# Patient Record
Sex: Female | Born: 1950
Health system: Southern US, Community
[De-identification: ages and names within clinical notes are randomized; demographics above are authoritative.]

## PROBLEM LIST (undated history)

## (undated) DIAGNOSIS — R9439 Abnormal result of other cardiovascular function study: Secondary | ICD-10-CM

## (undated) DIAGNOSIS — E785 Hyperlipidemia, unspecified: Secondary | ICD-10-CM

## (undated) DIAGNOSIS — J349 Unspecified disorder of nose and nasal sinuses: Secondary | ICD-10-CM

## (undated) DIAGNOSIS — G4733 Obstructive sleep apnea (adult) (pediatric): Secondary | ICD-10-CM

## (undated) DIAGNOSIS — G4730 Sleep apnea, unspecified: Secondary | ICD-10-CM

## (undated) DIAGNOSIS — I82409 Acute embolism and thrombosis of unspecified deep veins of unspecified lower extremity: Secondary | ICD-10-CM

## (undated) DIAGNOSIS — H269 Unspecified cataract: Secondary | ICD-10-CM

## (undated) DIAGNOSIS — D45 Polycythemia vera: Secondary | ICD-10-CM

## (undated) DIAGNOSIS — F32A Depression, unspecified: Secondary | ICD-10-CM

## (undated) DIAGNOSIS — E859 Amyloidosis, unspecified: Secondary | ICD-10-CM

## (undated) DIAGNOSIS — D510 Vitamin B12 deficiency anemia due to intrinsic factor deficiency: Secondary | ICD-10-CM

## (undated) DIAGNOSIS — I700 Atherosclerosis of aorta: Secondary | ICD-10-CM

## (undated) DIAGNOSIS — I5189 Other ill-defined heart diseases: Secondary | ICD-10-CM

## (undated) DIAGNOSIS — I5032 Chronic diastolic (congestive) heart failure: Secondary | ICD-10-CM

## (undated) DIAGNOSIS — D649 Anemia, unspecified: Secondary | ICD-10-CM

## (undated) DIAGNOSIS — I2510 Atherosclerotic heart disease of native coronary artery without angina pectoris: Secondary | ICD-10-CM

## (undated) DIAGNOSIS — I2699 Other pulmonary embolism without acute cor pulmonale: Secondary | ICD-10-CM

## (undated) DIAGNOSIS — M1990 Unspecified osteoarthritis, unspecified site: Secondary | ICD-10-CM

## (undated) DIAGNOSIS — Z5189 Encounter for other specified aftercare: Secondary | ICD-10-CM

## (undated) DIAGNOSIS — T7840XA Allergy, unspecified, initial encounter: Secondary | ICD-10-CM

## (undated) DIAGNOSIS — Z9989 Dependence on other enabling machines and devices: Secondary | ICD-10-CM

## (undated) DIAGNOSIS — K5090 Crohn's disease, unspecified, without complications: Secondary | ICD-10-CM

## (undated) DIAGNOSIS — K5010 Crohn's disease of large intestine without complications: Secondary | ICD-10-CM

## (undated) DIAGNOSIS — J849 Interstitial pulmonary disease, unspecified: Secondary | ICD-10-CM

## (undated) DIAGNOSIS — D751 Secondary polycythemia: Secondary | ICD-10-CM

## (undated) DIAGNOSIS — I519 Heart disease, unspecified: Secondary | ICD-10-CM

## (undated) DIAGNOSIS — I272 Pulmonary hypertension, unspecified: Secondary | ICD-10-CM

## (undated) DIAGNOSIS — E119 Type 2 diabetes mellitus without complications: Secondary | ICD-10-CM

## (undated) DIAGNOSIS — R0902 Hypoxemia: Secondary | ICD-10-CM

## (undated) DIAGNOSIS — T148XXA Other injury of unspecified body region, initial encounter: Secondary | ICD-10-CM

## (undated) DIAGNOSIS — R0600 Dyspnea, unspecified: Secondary | ICD-10-CM

## (undated) DIAGNOSIS — F419 Anxiety disorder, unspecified: Secondary | ICD-10-CM

## (undated) HISTORY — DX: Vitamin B12 deficiency anemia due to intrinsic factor deficiency: D51.0

## (undated) HISTORY — DX: Unspecified cataract: H26.9

## (undated) HISTORY — PX: JOINT REPLACEMENT: SHX530

## (undated) HISTORY — DX: Atherosclerosis of aorta: I70.0

## (undated) HISTORY — DX: Atherosclerotic heart disease of native coronary artery without angina pectoris: I25.10

## (undated) HISTORY — PX: SMALL INTESTINE SURGERY: SHX150

## (undated) HISTORY — DX: Depression, unspecified: F32.A

## (undated) HISTORY — PX: COLON SURGERY: SHX602

## (undated) HISTORY — DX: Unspecified disorder of nose and nasal sinuses: J34.9

## (undated) HISTORY — PX: EYE SURGERY: SHX253

## (undated) HISTORY — PX: HIP ARTHROPLASTY: SHX981

## (undated) HISTORY — DX: Interstitial pulmonary disease, unspecified: J84.9

## (undated) HISTORY — DX: Hypoxemia: R09.02

## (undated) HISTORY — DX: Chronic diastolic (congestive) heart failure: I50.32

## (undated) HISTORY — DX: Pulmonary hypertension, unspecified: I27.20

## (undated) HISTORY — DX: Polycythemia vera: D45

## (undated) HISTORY — DX: Allergy, unspecified, initial encounter: T78.40XA

## (undated) HISTORY — DX: Crohn's disease of large intestine without complications: K50.10

## (undated) HISTORY — PX: CARDIAC CATHETERIZATION: SHX172

## (undated) HISTORY — DX: Encounter for other specified aftercare: Z51.89

## (undated) HISTORY — DX: Hyperlipidemia, unspecified: E78.5

## (undated) HISTORY — DX: Amyloidosis, unspecified: E85.9

## (undated) HISTORY — PX: COLON RESECTION: SHX5231

## (undated) HISTORY — DX: Sleep apnea, unspecified: G47.30

---
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
## 1991-12-27 HISTORY — PX: COLON RESECTION: SHX5231

## 2008-12-26 DIAGNOSIS — I2699 Other pulmonary embolism without acute cor pulmonale: Secondary | ICD-10-CM

## 2008-12-26 HISTORY — DX: Other pulmonary embolism without acute cor pulmonale: I26.99

## 2010-01-06 ENCOUNTER — Encounter: Admission: RE | Admit: 2010-01-06 | Discharge: 2010-04-06 | Payer: Self-pay | Admitting: Unknown Physician Specialty

## 2010-05-11 ENCOUNTER — Encounter: Admission: RE | Admit: 2010-05-11 | Discharge: 2010-08-09 | Payer: Self-pay | Admitting: Unknown Physician Specialty

## 2010-08-19 ENCOUNTER — Encounter: Admission: RE | Admit: 2010-08-19 | Discharge: 2010-08-20 | Payer: Self-pay | Admitting: Unknown Physician Specialty

## 2012-02-22 ENCOUNTER — Emergency Department (HOSPITAL_BASED_OUTPATIENT_CLINIC_OR_DEPARTMENT_OTHER)
Admission: EM | Admit: 2012-02-22 | Discharge: 2012-02-23 | Disposition: A | Discharge status: Home / Self Care | Payer: 59 | Attending: Emergency Medicine | Admitting: Emergency Medicine

## 2012-02-22 ENCOUNTER — Encounter (HOSPITAL_BASED_OUTPATIENT_CLINIC_OR_DEPARTMENT_OTHER): Payer: Self-pay | Admitting: Emergency Medicine

## 2012-02-22 ENCOUNTER — Emergency Department (INDEPENDENT_AMBULATORY_CARE_PROVIDER_SITE_OTHER): Payer: 59

## 2012-02-22 ENCOUNTER — Emergency Department (HOSPITAL_BASED_OUTPATIENT_CLINIC_OR_DEPARTMENT_OTHER): Payer: 59

## 2012-02-22 DIAGNOSIS — M25529 Pain in unspecified elbow: Secondary | ICD-10-CM | POA: Insufficient documentation

## 2012-02-22 DIAGNOSIS — M79609 Pain in unspecified limb: Secondary | ICD-10-CM

## 2012-02-22 DIAGNOSIS — W010XXA Fall on same level from slipping, tripping and stumbling without subsequent striking against object, initial encounter: Secondary | ICD-10-CM

## 2012-02-22 DIAGNOSIS — M549 Dorsalgia, unspecified: Secondary | ICD-10-CM | POA: Insufficient documentation

## 2012-02-22 DIAGNOSIS — M25539 Pain in unspecified wrist: Secondary | ICD-10-CM | POA: Insufficient documentation

## 2012-02-22 DIAGNOSIS — K5090 Crohn's disease, unspecified, without complications: Secondary | ICD-10-CM | POA: Insufficient documentation

## 2012-02-22 DIAGNOSIS — M25429 Effusion, unspecified elbow: Secondary | ICD-10-CM

## 2012-02-22 DIAGNOSIS — S52123A Displaced fracture of head of unspecified radius, initial encounter for closed fracture: Secondary | ICD-10-CM

## 2012-02-22 DIAGNOSIS — W19XXXA Unspecified fall, initial encounter: Secondary | ICD-10-CM

## 2012-02-22 HISTORY — DX: Anemia, unspecified: D64.9

## 2012-02-22 HISTORY — DX: Crohn's disease, unspecified, without complications: K50.90

## 2012-02-22 MED ORDER — OXYCODONE-ACETAMINOPHEN 5-325 MG PO TABS: 1.0000 | ORAL_TABLET | ORAL | Status: AC | PRN

## 2012-02-22 NOTE — ED Provider Notes (Signed)
History     CSN: [id number]  Arrival date & time 02/22/12  2207   First MD Initiated Contact with Patient 02/22/12 2226      Chief Complaint  Patient presents with  . Fall  . Arm Injury  . Back Pain     Patient is a 61 y.o. female presenting with fall. The history is provided by the patient.  Fall The accident occurred less than 1 hour ago. The fall occurred while walking. She landed on concrete. The point of impact was the right elbow and right wrist. The pain is present in the right elbow and right wrist. The pain is moderate. She was ambulatory at the scene. Pertinent negatives include no headaches and no loss of consciousness.  Patient presents s/p fall just prior to arrival.  She was bringing her mother to the hospital, and in the process she tripped and fell on her right wrist/forearm.  She reports she tripped on the curb.   No head injury.  No LOC.  No headache.  No neck pain.  No back pain No cp/sob No abd pain She is ambulatory No weakness in her extremities No visual changes reported  Past Medical History  Diagnosis Date  . Crohn disease   . Anemia     Past Surgical History  Procedure Date  . Hip arthroplasty   . Colon resection     No family history on file.  History  Substance Use Topics  . Smoking status: Never Smoker   . Smokeless tobacco: Not on file  . Alcohol Use: Yes    OB History    Grav Para Term Preterm Abortions TAB SAB Ect Mult Living                  Review of Systems  Neurological: Negative for loss of consciousness and headaches.  All other systems reviewed and are negative.    Allergies  Prednisone and Sulfa drugs cross reactors  Home Medications   Current Outpatient Rx  Name Route Sig Dispense Refill  . BUPROPION HCL ER (SR) 100 MG PO TB12 Oral Take 100 mg by mouth daily.    [name] CALCIUM CARBONATE 1250 MG PO CHEW Oral Chew 1 tablet by mouth daily.    [name] CLONAZEPAM 0.5 MG PO TABS Oral Take 0.5 mg by mouth at bedtime as  needed. For sleep    . VITAMIN B-12 IJ Injection Inject as directed.    [name] FLUOXETINE HCL 20 MG PO CAPS Oral Take 20 mg by mouth daily.    [name] HYDROCODONE-ACETAMINOPHEN 5-325 MG PO TABS Oral Take 1 tablet by mouth every 6 (six) hours as needed. For pain    . MESALAMINE ER 500 MG PO CPCR Oral Take 250 mg by mouth. Up to 8 times a day    . VITAMIN D (CHOLECALCIFEROL) PO Oral Take 1 capsule by mouth daily.      BP 135/87  Pulse 118  Temp(Src) 98 F (36.7 C) (Oral)  Resp 18  Ht _0  (1.651 m)  Wt 225 lb (102.059 kg)  BMI 37.44 kg/m2  SpO2 95%  Physical Exam CONSTITUTIONAL: Well developed/well nourished HEAD AND FACE: Normocephalic/atraumatic EYES: EOMI/PERRL ENMT: Mucous membranes moist, No evidence of facial/nasal trauma NECK: supple no meningeal signs SPINE:entire spine nontender, NEXUS criteria met, No bruising/crepitance/stepoffs noted to spine CV: S1/S2 noted, no murmurs/rubs/gallops noted LUNGS: Lungs are clear to auscultation bilaterally, no apparent distress ABDOMEN: soft, nontender, no rebound or guarding GU:no cva tenderness NEURO: Pt is  awake/alert, moves all extremitiesx4. GCS 15.  Pt is ambulatory EXTREMITIES: pulses normal, full ROM.  Tenderness to palpation of right anterior shoulder but no deformity and she is able to range the shoulder but is limited in abduction above 90 degrees. She has tenderness with ROM of right elbow (no deformity) and mild tenderness to right wrist.  Negative snuffbox on right wrist.  Distal neurovascular intact on right Ue - full ROM of right wrist, equal hand grip. All other extremities/joints palpated/ranged and nontender No clavicle tenderness SKIN: warm, color normal PSYCH: no abnormalities of mood noted  ED Course  Procedures   Pt deferred pain meds initially  11:58 PM Pt with likely nondisplaced radial head fracture No other injury noted She is well appearing, ambulatory Will place in posterior splint/sling and close ortho/sports  med followup for re-evaluation within 5 days Pt agreeable with plan   The patient appears reasonably screened and/or stabilized for discharge and I doubt any other medical condition or other [facility] requiring further screening, evaluation, or treatment in the ED at this time prior to discharge.     MDM  Nursing notes reviewed and considered in documentation xrays reviewed and considered Discussed with radiologist        Sharyon Cable, MD 02/23/12 [phone]

## 2012-02-22 NOTE — ED Notes (Signed)
Pt fell in parking lot over curb. Pt c/o right arm pain from shoulder to wrist and pain in back between shoulder blades.

## 2012-03-09 ENCOUNTER — Ambulatory Visit
Admission: RE | Admit: 2012-03-09 | Discharge: 2012-03-09 | Disposition: A | Discharge status: Home / Self Care | Payer: No Typology Code available for payment source | Source: Ambulatory Visit | Attending: Orthopedic Surgery | Admitting: Orthopedic Surgery

## 2012-03-09 ENCOUNTER — Other Ambulatory Visit: Payer: Self-pay | Admitting: Orthopedic Surgery

## 2012-03-09 DIAGNOSIS — S52123A Displaced fracture of head of unspecified radius, initial encounter for closed fracture: Secondary | ICD-10-CM

## 2012-06-01 ENCOUNTER — Other Ambulatory Visit: Payer: Self-pay | Admitting: Orthopedic Surgery

## 2012-06-01 ENCOUNTER — Ambulatory Visit
Admission: RE | Admit: 2012-06-01 | Discharge: 2012-06-01 | Disposition: A | Discharge status: Home / Self Care | Payer: 59 | Source: Ambulatory Visit | Attending: Orthopedic Surgery | Admitting: Orthopedic Surgery

## 2012-06-01 DIAGNOSIS — T148XXA Other injury of unspecified body region, initial encounter: Secondary | ICD-10-CM

## 2012-12-14 ENCOUNTER — Other Ambulatory Visit: Payer: Self-pay | Admitting: Orthopedic Surgery

## 2012-12-14 ENCOUNTER — Ambulatory Visit (INDEPENDENT_AMBULATORY_CARE_PROVIDER_SITE_OTHER): Payer: 59

## 2012-12-14 DIAGNOSIS — W19XXXA Unspecified fall, initial encounter: Secondary | ICD-10-CM

## 2012-12-14 DIAGNOSIS — M25549 Pain in joints of unspecified hand: Secondary | ICD-10-CM

## 2012-12-14 DIAGNOSIS — R52 Pain, unspecified: Secondary | ICD-10-CM

## 2014-11-12 DIAGNOSIS — I82409 Acute embolism and thrombosis of unspecified deep veins of unspecified lower extremity: Secondary | ICD-10-CM | POA: Insufficient documentation

## 2014-11-12 DIAGNOSIS — T849XXA Unspecified complication of internal orthopedic prosthetic device, implant and graft, initial encounter: Secondary | ICD-10-CM | POA: Insufficient documentation

## 2014-11-12 DIAGNOSIS — Z471 Aftercare following joint replacement surgery: Secondary | ICD-10-CM | POA: Insufficient documentation

## 2014-11-12 DIAGNOSIS — I2699 Other pulmonary embolism without acute cor pulmonale: Secondary | ICD-10-CM | POA: Diagnosis present

## 2014-11-12 DIAGNOSIS — D45 Polycythemia vera: Secondary | ICD-10-CM | POA: Insufficient documentation

## 2014-11-12 DIAGNOSIS — Z862 Personal history of diseases of the blood and blood-forming organs and certain disorders involving the immune mechanism: Secondary | ICD-10-CM | POA: Insufficient documentation

## 2015-11-11 DIAGNOSIS — J069 Acute upper respiratory infection, unspecified: Secondary | ICD-10-CM | POA: Diagnosis not present

## 2015-12-25 DIAGNOSIS — D649 Anemia, unspecified: Secondary | ICD-10-CM | POA: Insufficient documentation

## 2015-12-30 DIAGNOSIS — D649 Anemia, unspecified: Secondary | ICD-10-CM | POA: Diagnosis not present

## 2015-12-30 DIAGNOSIS — Z79899 Other long term (current) drug therapy: Secondary | ICD-10-CM | POA: Diagnosis not present

## 2016-09-02 DIAGNOSIS — H52223 Regular astigmatism, bilateral: Secondary | ICD-10-CM | POA: Diagnosis not present

## 2016-09-02 DIAGNOSIS — H353131 Nonexudative age-related macular degeneration, bilateral, early dry stage: Secondary | ICD-10-CM | POA: Diagnosis not present

## 2016-09-02 DIAGNOSIS — H524 Presbyopia: Secondary | ICD-10-CM | POA: Diagnosis not present

## 2016-09-02 DIAGNOSIS — H5213 Myopia, bilateral: Secondary | ICD-10-CM | POA: Diagnosis not present

## 2016-09-02 DIAGNOSIS — H2011 Chronic iridocyclitis, right eye: Secondary | ICD-10-CM | POA: Diagnosis not present

## 2016-09-12 DIAGNOSIS — H2011 Chronic iridocyclitis, right eye: Secondary | ICD-10-CM | POA: Diagnosis not present

## 2016-11-08 DIAGNOSIS — H2011 Chronic iridocyclitis, right eye: Secondary | ICD-10-CM | POA: Diagnosis not present

## 2016-12-07 DIAGNOSIS — D508 Other iron deficiency anemias: Secondary | ICD-10-CM | POA: Diagnosis not present

## 2016-12-07 DIAGNOSIS — D649 Anemia, unspecified: Secondary | ICD-10-CM | POA: Diagnosis not present

## 2017-01-24 DIAGNOSIS — S29012A Strain of muscle and tendon of back wall of thorax, initial encounter: Secondary | ICD-10-CM | POA: Diagnosis not present

## 2017-01-24 DIAGNOSIS — X500XXA Overexertion from strenuous movement or load, initial encounter: Secondary | ICD-10-CM | POA: Diagnosis not present

## 2017-04-03 ENCOUNTER — Other Ambulatory Visit: Payer: Self-pay | Admitting: Allergy

## 2017-04-03 ENCOUNTER — Ambulatory Visit
Admission: RE | Admit: 2017-04-03 | Discharge: 2017-04-03 | Disposition: A | Discharge status: Home / Self Care | Payer: Medicare Other | Source: Ambulatory Visit | Attending: Allergy | Admitting: Allergy

## 2017-04-03 DIAGNOSIS — R059 Cough, unspecified: Secondary | ICD-10-CM

## 2017-04-03 DIAGNOSIS — R05 Cough: Secondary | ICD-10-CM

## 2017-04-03 DIAGNOSIS — J300 Vasomotor rhinitis: Secondary | ICD-10-CM | POA: Diagnosis not present

## 2017-04-03 DIAGNOSIS — J309 Allergic rhinitis, unspecified: Secondary | ICD-10-CM | POA: Diagnosis not present

## 2017-05-09 ENCOUNTER — Telehealth: Payer: Self-pay | Admitting: Internal Medicine

## 2017-05-09 ENCOUNTER — Encounter: Payer: Self-pay | Admitting: Internal Medicine

## 2017-05-09 ENCOUNTER — Ambulatory Visit (INDEPENDENT_AMBULATORY_CARE_PROVIDER_SITE_OTHER): Payer: Medicare Other | Admitting: Internal Medicine

## 2017-05-09 VITALS — BP 144/88 | HR 87 | Ht 64.0 in | Wt 270.6 lb

## 2017-05-09 DIAGNOSIS — R0989 Other specified symptoms and signs involving the circulatory and respiratory systems: Secondary | ICD-10-CM

## 2017-05-09 DIAGNOSIS — R683 Clubbing of fingers: Secondary | ICD-10-CM

## 2017-05-09 DIAGNOSIS — R0602 Shortness of breath: Secondary | ICD-10-CM | POA: Diagnosis not present

## 2017-05-09 NOTE — Telephone Encounter (Signed)
lmtcb for pt.   Need to see if pt has the wrong AVS, if so, what the 4 numbers written on top right hand of AVS.

## 2017-05-09 NOTE — Progress Notes (Signed)
Subjective:    Patient ID: Patty [name], female    DOB: 02/05/1951, 66 y.o.   MRN: [id number]  PCP Patient, No Pcp Per  HPI    IOV  05/09/2017  Chief Complaint  Patient presents with  . Pulmonary Consult    Pt referred for SOB with activity x years. Pt states over the last year the DOE has worsened. Pt denies cough and CP/tightness and f/c/s.     66 year old obese female. In 2004 Started noticing insidious onset of shortness of breath. In 2005 more [place] from [place] and probably gain over 60 pounds of weight. She has extensive workup at that time according to her history. She was then diagnosed with polycythemia by Dr. [name] at [facility]. She does not recollect any phlebotomies for this. However in the last 4-5 years she's had worsening shortness of breath. Noticeable with exertion particularly in the gym and doing standing exercises but not so much with sitting exercises. Relieved by rest. When she does some yard work she notices some associated wheezing as well.  Walking desaturation test in the office she did desaturate to 88% and did get tachycardic. She says that a chest x-ray done by primary care physicians interstitial pulmonary fibrosis. I personally visualized the chest x-ray done 04/03/2017: There is some interstitial markings but it is obscured by her obesity. I'm not so certain that is definite ILD. There is no lab work in the record. There is no PFT a CT scan available for visualization      has a past medical history of Anemia; Crohn disease ([place]); and Polycythemia vera ([place]).   reports that she quit smoking about 8 years ago. Her smoking use included Cigarettes. She has a 45.00 pack-year smoking history. She has never used smokeless tobacco.  Past Surgical History:  Procedure Laterality Date  . COLON RESECTION    . HIP ARTHROPLASTY      Allergies  Allergen Reactions  . Prednisone Other (See Comments)    Causes mental stress  . Sulfa Drugs Cross  Reactors Other (See Comments)    "feels like bugs crawling on me"     There is no immunization history on file for this patient.  Family History  Problem Relation Age of Onset  . Glaucoma Mother   . Diabetes Mellitus II Father   . COPD Brother   . Heart disease Brother   . Non-Hodgkin's lymphoma Brother   . Diabetes Mellitus II Brother   . Glaucoma Brother      Current Outpatient Prescriptions:  .  B Complex-C (B-COMPLEX WITH VITAMIN C) tablet, Take 1 tablet by mouth daily., Disp: , Rfl:  .  calcium carbonate (OS-CAL) 1250 MG chewable tablet, Chew 1 tablet by mouth daily., Disp: , Rfl:  .  Cyanocobalamin (VITAMIN B-12 IJ), Inject as directed., Disp: , Rfl:  .  traMADol (ULTRAM) 50 MG tablet, Take 50 mg by mouth every 6 (six) hours as needed., Disp: , Rfl:  .  VITAMIN D, CHOLECALCIFEROL, PO, Take 1 capsule by mouth daily., Disp: , Rfl:   Review of Systems  Constitutional: Negative for fever and unexpected weight change.  HENT: Positive for congestion. Negative for dental problem, ear pain, nosebleeds, postnasal drip, rhinorrhea, sinus pressure, sneezing, sore throat and trouble swallowing.   Eyes: Negative for redness and itching.  Respiratory: Positive for shortness of breath. Negative for cough, chest tightness and wheezing.   Cardiovascular: Negative for palpitations and leg swelling.  Gastrointestinal: Negative for nausea and  vomiting.  Genitourinary: Negative for dysuria.  Musculoskeletal: Negative for joint swelling.  Skin: Negative for rash.  Neurological: Negative for headaches.  Hematological: Does not bruise/bleed easily.  Psychiatric/Behavioral: Negative for dysphoric mood. The patient is not nervous/anxious.        Objective:   Physical Exam  Constitutional: She is oriented to person, place, and time. She appears well-developed and well-nourished. No distress.  HENT:  Head: Normocephalic and atraumatic.  Right Ear: External ear normal.  Left Ear: External  ear normal.  Mouth/Throat: Oropharynx is clear and moist. No oropharyngeal exudate.  Eyes: Conjunctivae and EOM are normal. Pupils are equal, round, and reactive to light. Right eye exhibits no discharge. Left eye exhibits no discharge. No scleral icterus.  Neck: Normal range of motion. Neck supple. No JVD present. No tracheal deviation present. No thyromegaly present.  Cardiovascular: Normal rate, regular rhythm, normal heart sounds and intact distal pulses.  Exam reveals no gallop and no friction rub.   No murmur heard. Pulmonary/Chest: Effort normal. No respiratory distress. She has no wheezes. She has rales. She exhibits no tenderness.  Possible crackles in the lung base but not sure  Abdominal: Soft. Bowel sounds are normal. She exhibits no distension and no mass. There is no tenderness. There is no rebound and no guarding.  Musculoskeletal: Normal range of motion. She exhibits no edema or tenderness.  Lymphadenopathy:    She has no cervical adenopathy.  Neurological: She is alert and oriented to person, place, and time. She has normal reflexes. No cranial nerve deficit. She exhibits normal muscle tone. Coordination normal.  Skin: Skin is warm and dry. No rash noted. She is not diaphoretic. No erythema. No pallor.  Psychiatric: She has a normal mood and affect. Her behavior is normal. Judgment and thought content normal.  Vitals reviewed.   Vitals:   05/09/17 1134 05/09/17 1214  BP: (!) 144/88 128/72  Pulse: 87 74  SpO2: 97% 96%  Weight: 270 lb 9.6 oz (122.7 kg) 173 lb (78.5 kg)  Height: _0  (1.626 m) _1  (1.651 m)   Estimated body mass index is 28.79 kg/m as calculated from the following:   Height as of this encounter: _2  (1.651 m).   Weight as of this encounter: 173 lb (78.5 kg).        Assessment & Plan:     ICD-9-CM ICD-10-CM   1. Shortness of breath 786.05 R06.02 Pulmonary function test     CT Chest High Resolution     Pulse oximetry, overnight  2. Lung  crackles 786.7 R09.89 Pulmonary function test     CT Chest High Resolution  3. Clubbing of fingers 781.5 R68.3 Pulmonary function test     CT Chest High Resolution    Concern is for ILD  Do HRCT supine and prone Do ono room air Do PFT  Followup  next few weeks to see Dr. Chase Caller nurse practitioner Patricia Nettle but after completion of above tests - If tests are negative consider nitric oxide testing and if negative pulmonary stress testing - If interstitial lung disease present: [place] of chest physicians interstitial lung disease questionnaire and order autoimmune profile    Dr. Brand Males, M.D., [facility].C.P Pulmonary and Critical Care Medicine Staff Physician [place] [facility] Pager: [phone], If no answer or between  15:00h - 7:00h: call [phone]  05/09/2017 12:20 PM

## 2017-05-09 NOTE — Patient Instructions (Addendum)
ICD-9-CM ICD-10-CM   1. Shortness of breath 786.05 R06.02 Pulmonary function test     CT Chest High Resolution     Pulse oximetry, overnight  2. Lung crackles 786.7 R09.89 Pulmonary function test     CT Chest High Resolution  3. Clubbing of fingers 781.5 R68.3 Pulmonary function test     CT Chest High Resolution   Concern is for ILD  Do HRCT supine and prone Do ono room air Do PFT  Followup  next few weeks to see Dr. Chase Caller nurse practitioner Patricia Nettle but after completion of above tests - If tests are negative consider nitric oxide testing and if negative pulmonary stress testing - If interstitial lung disease present: [place] of chest physicians interstitial lung disease questionnaire and order autoimmune profile

## 2017-05-10 NOTE — Telephone Encounter (Signed)
LMTCB on both numbers listed

## 2017-05-11 NOTE — Telephone Encounter (Signed)
lmtcb for pt.

## 2017-05-11 NOTE — Telephone Encounter (Signed)
LMTCB for pt.  There were two sets of vitals entered in pt's chart, the first set is the correct set and the second set is incorrect. The pt's chart was corrected and a new AVS was printed and mailed to pt's home. Will forward to another TL to make aware of issue.

## 2017-05-11 NOTE — Telephone Encounter (Signed)
Patient returned call.  Verified with her again that she has correct AVS, had her read me MRN on the bottom, which is her MRN.  Numbers on the top right are [id number].  CB is [phone]

## 2017-05-12 ENCOUNTER — Ambulatory Visit (INDEPENDENT_AMBULATORY_CARE_PROVIDER_SITE_OTHER)
Admission: RE | Admit: 2017-05-12 | Discharge: 2017-05-12 | Disposition: A | Discharge status: Home / Self Care | Payer: Medicare Other | Source: Ambulatory Visit | Attending: Internal Medicine | Admitting: Internal Medicine

## 2017-05-12 DIAGNOSIS — R0602 Shortness of breath: Secondary | ICD-10-CM | POA: Diagnosis not present

## 2017-05-12 DIAGNOSIS — R683 Clubbing of fingers: Secondary | ICD-10-CM

## 2017-05-12 DIAGNOSIS — R0989 Other specified symptoms and signs involving the circulatory and respiratory systems: Secondary | ICD-10-CM

## 2017-05-15 NOTE — Telephone Encounter (Signed)
Noted. Will sign off.  

## 2017-05-15 NOTE — Telephone Encounter (Signed)
LMOM TCB x2 to make pt aware that [facility] mailed updated AVS  TL's Please see Daneil Dan previous message.

## 2017-05-16 ENCOUNTER — Telehealth: Payer: Self-pay | Admitting: Internal Medicine

## 2017-05-16 NOTE — Telephone Encounter (Signed)
I have called and lmom for the pt to make her aware that the new AVS has been mailed to her.

## 2017-05-17 ENCOUNTER — Ambulatory Visit (INDEPENDENT_AMBULATORY_CARE_PROVIDER_SITE_OTHER): Payer: Medicare Other | Admitting: Internal Medicine

## 2017-05-17 DIAGNOSIS — R0602 Shortness of breath: Secondary | ICD-10-CM

## 2017-05-17 DIAGNOSIS — R0989 Other specified symptoms and signs involving the circulatory and respiratory systems: Secondary | ICD-10-CM

## 2017-05-17 DIAGNOSIS — R683 Clubbing of fingers: Secondary | ICD-10-CM

## 2017-05-17 LAB — PULMONARY FUNCTION TEST
DL/VA % pred: 60 %
DL/VA: 2.89 ml/min/mmHg/L
DLCO cor % pred: 50 %
DLCO cor: 12.31 ml/min/mmHg
DLCO unc % pred: 52 %
DLCO unc: 12.64 ml/min/mmHg
FEF 25-75 Post: 1.2 L/sec
FEF 25-75 Pre: 1.21 L/sec
FEF2575-%Change-Post: -1 %
FEF2575-%Pred-Post: 58 %
FEF2575-%Pred-Pre: 59 %
FEV1-%Change-Post: 0 %
FEV1-%Pred-Post: 77 %
FEV1-%Pred-Pre: 77 %
FEV1-Post: 1.84 L
FEV1-Pre: 1.83 L
FEV1FVC-%Change-Post: 4 %
FEV1FVC-%Pred-Pre: 90 %
FEV6-%Change-Post: -2 %
FEV6-%Pred-Post: 85 %
FEV6-%Pred-Pre: 87 %
FEV6-Post: 2.54 L
FEV6-Pre: 2.61 L
FEV6FVC-%Pred-Post: 104 %
FEV6FVC-%Pred-Pre: 104 %
FVC-%Change-Post: -2 %
FVC-%Pred-Post: 81 %
FVC-%Pred-Pre: 84 %
FVC-Post: 2.54 L
FVC-Pre: 2.61 L
Post FEV1/FVC ratio: 73 %
Post FEV6/FVC ratio: 100 %
Pre FEV1/FVC ratio: 70 %
Pre FEV6/FVC Ratio: 100 %
RV % pred: 115 %
RV: 2.44 L
TLC % pred: 100 %
TLC: 5.09 L

## 2017-05-17 NOTE — Progress Notes (Signed)
PFT completed today. 05/17/17

## 2017-05-23 DIAGNOSIS — R0602 Shortness of breath: Secondary | ICD-10-CM | POA: Diagnosis not present

## 2017-05-24 DIAGNOSIS — R0602 Shortness of breath: Secondary | ICD-10-CM | POA: Diagnosis not present

## 2017-05-26 ENCOUNTER — Telehealth: Payer: Self-pay | Admitting: Internal Medicine

## 2017-05-26 NOTE — Telephone Encounter (Signed)
[name] you are seeing Patty [name] 05/29/17 as dyspnea with ? ILD presence workup  PFT - isolated low dlco HRCT - possilbe ILD + suspected pulm htn  rec   - please administer ACCP ILD questionnaire - google or elise can give you one and then document it. Many times I jus tell patients to do it in waiting room  - do Serum: ESR, ACE, ANA, DS-DNA, RF, anti-CCP, ssA, ssB, scl-70, ANCA screen, MPO, PR-3, Total CK,  RNP, Aldolase,  Hypersensitivity Pneumonitis Panel   - get echo for possilbe pulm htn  - if results are negative might need bx  Dr.  , M.D., F.C.C.P Pulmonary and Critical Care Medicine Staff Physician [facility] [place] [facility] Pager: [phone], If no answer or between  15:00h - 7:00h: call [phone]  05/26/2017 2:25 PM    

## 2017-05-29 ENCOUNTER — Telehealth: Payer: Self-pay | Admitting: Adult Health

## 2017-05-29 ENCOUNTER — Ambulatory Visit (INDEPENDENT_AMBULATORY_CARE_PROVIDER_SITE_OTHER): Payer: Medicare Other | Admitting: Adult Health

## 2017-05-29 ENCOUNTER — Other Ambulatory Visit (INDEPENDENT_AMBULATORY_CARE_PROVIDER_SITE_OTHER): Payer: Medicare Other

## 2017-05-29 ENCOUNTER — Encounter: Payer: Self-pay | Admitting: Adult Health

## 2017-05-29 VITALS — BP 128/86 | HR 79 | Ht 64.0 in | Wt 240.2 lb

## 2017-05-29 DIAGNOSIS — I7090 Unspecified atherosclerosis: Secondary | ICD-10-CM | POA: Diagnosis not present

## 2017-05-29 DIAGNOSIS — R938 Abnormal findings on diagnostic imaging of other specified body structures: Secondary | ICD-10-CM

## 2017-05-29 DIAGNOSIS — I272 Pulmonary hypertension, unspecified: Secondary | ICD-10-CM | POA: Diagnosis not present

## 2017-05-29 DIAGNOSIS — J849 Interstitial pulmonary disease, unspecified: Secondary | ICD-10-CM

## 2017-05-29 DIAGNOSIS — R9389 Abnormal findings on diagnostic imaging of other specified body structures: Secondary | ICD-10-CM

## 2017-05-29 DIAGNOSIS — J9611 Chronic respiratory failure with hypoxia: Secondary | ICD-10-CM | POA: Diagnosis not present

## 2017-05-29 LAB — SEDIMENTATION RATE: Sed Rate: 56 mm/hr — ABNORMAL HIGH (ref 0–30)

## 2017-05-29 NOTE — Telephone Encounter (Signed)
There is no info on a method tried, such as inhalers to treat resp diagnosis prior to O2. May call with questions.

## 2017-05-29 NOTE — Assessment & Plan Note (Signed)
Very Mild ILD changes on CT chest with isolated mod/severe diffucing capacity . No exertional desaturations. No significant restriction or obstruction on pulmonary function testing. Patient does have a history of Crohn's disease. Will need to continue with workup with autoimmune and connective tissue labs. We'll discuss her results on return. Consider repeat spirometry with DLCO in 6 months. Patient advised on exercise and activity as tolerated. Will discussed pulmonary rehabilitation on return CT chest did show incidental findings of enlarged pulmonary arteries will need to check 2-D echo for possible pulmonary hypertension. Overnight oximetry testing did show nocturnal desaturations. We'll begin on oxygen at 2 L. We'll also check for home sleep study is underlying sleep apnea may be present as well. CT did show aortic and coronary atherosclerosis. Patient will be referred to cardiology for risk stratification and need for any additional testing.

## 2017-05-29 NOTE — Assessment & Plan Note (Signed)
Overnight oximetry test did showed nocturnal desaturations. Patient had no ambulatory desaturations. Patient does have some risk factors for possible sleep apnea with daytime fatigue, trouble sleeping. And nocturnal hypoxia. Will check home sleep study. Patient is to begin on oxygen at 2 L. We'll repeat a ON O in 2 weeks after oxygen is started.

## 2017-05-29 NOTE — Patient Instructions (Addendum)
Labs today .  We will set up for a 2 D echo . Refer to Cardiologist  For abnormal CT chest that shows  coronary atherosclerosis. Begin Oxygen 2l/m At bedtime   Check ONO in 2 weeks after Oxygen is started.  Set up for home sleep study .  Follow up with Dr. Chase Caller in 6-8 weeks and As needed   Please contact office for sooner follow up if symptoms do not improve or worsen or seek emergency care

## 2017-05-29 NOTE — Progress Notes (Signed)
_0  ID: Patty [name], female    DOB: 1951/07/23, 66 y.o.   MRN: [id number]  Chief Complaint  Patient presents with  . Follow-up    dyspnea     Referring provider: No ref. provider found  HPI: 66 year old female former smoker seen for pulmonary consult 05/09/2017 for progressive dyspnea since 2004.[name] Patient has polycythemia vera followed by hematology and [place] She has Crohn's disease   05/29/2017 Follow up : Dyspnea  Patient returns for a two-week follow-up. Patient was seen for pulmonary consult 05/09/2017 for progressive dyspnea since 2004. Patient was set up for a high resolution CT chest that showed a very mild basilar predominant subpleural reticulation and bronchiectasis which could be due to nonspecific interstitial pneumonitis.. CT did have incidental findings of enlarged pulmonary arteries and aortic and coronary calcification. Patient had pulmonary function test on May 23 that showed an FEV1 at 77%, ratio 73, FVC 81, no significant bronchodilator response, total lung capacity 100%, DLCO 52%. Overnight oximetry test did show significant desaturations. We discussed beginning oxygen at bedtime.  Patient denies significant cough. Says that she get short of breath with walking. Has daytime fatigue and low energy..  Interstitial lung disease patient questionnaire was completed as follows Patient has no previous use of Macrodantin, amiodarone, methotrexate. She has been treated with prednisone for her Crohn's in the past. Patient has had extensive travel with brief visits to multiple state and country's.. [place]. Did live in a apartment that she did notice mold for 4 years. She does have Crohn's disease and was on Mesamaline for many years. Patient says she has had a humidifier and hot tub and previous house. She has no indoor birds. She does have a dog. Patient says she was told that she had a blood clot in the past but was never found.      Allergies   Allergen Reactions  . Prednisone Other (See Comments)    Causes mental stress  . Sulfa Drugs Cross Reactors Other (See Comments)    "feels like bugs crawling on me"     There is no immunization history on file for this patient.  Past Medical History:  Diagnosis Date  . Anemia   . Crohn disease ([place])   . Polycythemia vera ([place])     Tobacco History: History  Smoking Status  . Former Smoker  . Packs/day: 1.00  . Years: 45.00  . Types: Cigarettes  . Quit date: 12/26/2008  Smokeless Tobacco  . Never Used   Counseling given: Not Answered   Outpatient Encounter Prescriptions as of 05/29/2017  Medication Sig  . B Complex-C (B-COMPLEX WITH VITAMIN C) tablet Take 1 tablet by mouth daily.  . calcium carbonate (OS-CAL) 1250 MG chewable tablet Chew 1 tablet by mouth daily.  . Cyanocobalamin (VITAMIN B-12 IJ) Inject as directed.  . traMADol (ULTRAM) 50 MG tablet Take 50 mg by mouth every 6 (six) hours as needed.  [name] VITAMIN D, CHOLECALCIFEROL, PO Take 1 capsule by mouth daily.   No facility-administered encounter medications on file as of 05/29/2017.      Review of Systems  Constitutional:   No  weight loss, night sweats,  Fevers, chills,  +fatigue, or  lassitude.  HEENT:   No headaches,  Difficulty swallowing,  Tooth/dental problems, or  Sore throat,                No sneezing, itching, ear ache, nasal congestion, post nasal drip,   CV:  No  chest pain,  Orthopnea, PND, swelling in lower extremities, anasarca, dizziness, palpitations, syncope.   GI  No heartburn, indigestion, abdominal pain, nausea, vomiting, diarrhea, change in bowel habits, loss of appetite, bloody stools.   Resp:No excess mucus, no productive cough,  No non-productive cough,  No coughing up of blood.  No change in color of mucus.  No wheezing.  No chest wall deformity  Skin: no rash or lesions.  GU: no dysuria, change in color of urine, no urgency or frequency.  No flank pain, no hematuria   MS:  No joint  pain or swelling.  No decreased range of motion.  No back pain.    Physical Exam  BP 128/86 (BP Location: Left Arm, Cuff Size: Normal)   Pulse 79   Ht _0  (1.626 m)   Wt 240 lb 3.2 oz (109 kg)   SpO2 94%   BMI 41.23 kg/m   GEN: A/Ox3; pleasant , NAD, obese    HEENT:  [place]/AT,  EACs-clear, TMs-wnl, NOSE-clear, THROAT-clear, no lesions, no postnasal drip or exudate noted.Class 3 MP airway   NECK:  Supple w/ fair ROM; no JVD; normal carotid impulses w/o bruits; no thyromegaly or nodules palpated; no lymphadenopathy.    RESP  Clear  P & A; w/o, wheezes/ rales/ or rhonchi. no accessory muscle use, no dullness to percussion  CARD:  RRR, no m/r/g, no peripheral edema, pulses intact, no cyanosis or clubbing.  GI:   Soft & nt; nml bowel sounds; no organomegaly or masses detected.   Musco: Warm bil, no deformities or joint swelling noted.   Neuro: alert, no focal deficits noted.    Skin: Warm, no lesions or rashes    Lab Results:  CBC No results found for: WBC, RBC, HGB, HCT, PLT, MCV, MCH, MCHC, RDW, LYMPHSABS, MONOABS, EOSABS, BASOSABS  BMET No results found for: NA, K, CL, CO2, GLUCOSE, BUN, CREATININE, CALCIUM, GFRNONAA, GFRAA  BNP No results found for: BNP  ProBNP No results found for: PROBNP  Imaging: Ct Chest High Resolution  Result Date: 05/13/2017 CLINICAL DATA:  Increasing shortness of breath with exertion. Clubbing and crackles. EXAM: CT CHEST WITHOUT CONTRAST TECHNIQUE: Multidetector CT imaging of the chest was performed following the standard protocol without intravenous contrast. High resolution imaging of the lungs, as well as inspiratory and expiratory imaging, was performed. COMPARISON:  None. FINDINGS: Cardiovascular: Atherosclerotic calcification of the arterial vasculature, including coronary arteries. Heart size normal. No pericardial effusion. Pulmonary arteries are enlarged. Mediastinum/Nodes: No pathologically enlarged mediastinal or axillary lymph  nodes. Hilar regions are difficult to evaluate without IV contrast. Esophagus is grossly unremarkable. Lungs/Pleura: Very mild basilar predominant subpleural reticulation, which persists on prone imaging. Minimal bronchiolectasis. No architectural distortion or honeycombing. Mild scarring in the lingula and left lower lobe. No air trapping. No pleural fluid. Upper Abdomen: Visualized portions of the liver, gallbladder, adrenal glands, kidneys, spleen, pancreas and stomach are grossly unremarkable. No upper abdominal adenopathy. Musculoskeletal: Degenerative changes in the spine. No worrisome lytic or sclerotic lesions. IMPRESSION: 1. Suspect very mild basilar predominant subpleural reticulation and bronchiolectasis, which may be due to nonspecific interstitial pneumonitis. 2. Aortic atherosclerosis (ICD10-170.0). Coronary artery calcification. 3. Enlarged pulmonary arteries, indicative of pulmonary arterial hypertension. Electronically Signed   By: Lorin Picket M.D.   On: 05/13/2017 07:33     Assessment & Plan:   ILD (interstitial lung disease) ([place]) Very Mild ILD changes on CT chest with isolated mod/severe diffucing capacity . No exertional desaturations. No significant restriction or obstruction  on pulmonary function testing. Patient does have a history of Crohn's disease. Will need to continue with workup with autoimmune and connective tissue labs. We'll discuss her results on return. Consider repeat spirometry with DLCO in 6 months. Patient advised on exercise and activity as tolerated. Will discussed pulmonary rehabilitation on return CT chest did show incidental findings of enlarged pulmonary arteries will need to check 2-D echo for possible pulmonary hypertension. Overnight oximetry testing did show nocturnal desaturations. We'll begin on oxygen at 2 L. We'll also check for home sleep study is underlying sleep apnea may be present as well. CT did show aortic and coronary atherosclerosis.  Patient will be referred to cardiology for risk stratification and need for any additional testing.  Chronic respiratory failure with hypoxia (HCC) Overnight oximetry test did showed nocturnal desaturations. Patient had no ambulatory desaturations. Patient does have some risk factors for possible sleep apnea with daytime fatigue, trouble sleeping. And nocturnal hypoxia. Will check home sleep study. Patient is to begin on oxygen at 2 L. We'll repeat a ON O in 2 weeks after oxygen is started.     Rexene Edison, NP 05/29/2017

## 2017-05-30 ENCOUNTER — Encounter: Payer: Self-pay | Admitting: Cardiovascular Disease

## 2017-05-30 ENCOUNTER — Encounter: Payer: Self-pay | Admitting: Adult Health

## 2017-05-30 LAB — ANCA SCREEN W REFLEX TITER: ANCA Screen: NEGATIVE

## 2017-05-30 LAB — CYCLIC CITRUL PEPTIDE ANTIBODY, IGG: Cyclic Citrullin Peptide Ab: <16 Units

## 2017-05-30 LAB — ANTI-DNA ANTIBODY, DOUBLE-STRANDED: ds DNA Ab: 1 IU/mL

## 2017-05-30 LAB — CK TOTAL AND CKMB (NOT AT ARMC)
CK, MB: 1.3 ng/mL (ref 0.0–5.0)
Relative Index: 1.6 (ref 0.0–4.0)
Total CK: 81 U/L (ref 29–143)

## 2017-05-30 LAB — SJOGREN'S SYNDROME ANTIBODS(SSA + SSB)
SSA (Ro) (ENA) Antibody, IgG: <1
SSB (La) (ENA) Antibody, IgG: <1

## 2017-05-30 LAB — RHEUMATOID FACTOR: Rhuematoid fact SerPl-aCnc: <14 IU/mL (ref <14)

## 2017-05-30 LAB — MPO/PR-3 (ANCA) ANTIBODIES
Myeloperoxidase Abs: <1
Serine Protease 3: <1

## 2017-05-30 LAB — ANA: Anti Nuclear Antibody(ANA): NEGATIVE

## 2017-05-30 LAB — ANTI-SCLERODERMA ANTIBODY: Scleroderma (Scl-70) (ENA) Antibody, IgG: <1

## 2017-05-30 LAB — RNP ANTIBODY: Ribonucleic Protein(ENA) Antibody, IgG: <1

## 2017-05-30 NOTE — Telephone Encounter (Signed)
Spoke with Corene Cornea at [facility], states that since pt was newly diagnosed with ILD and there is no trial and failure on any inhalers to aid with hypoxia, Medicare will not cover supplemental 02.  Additionally, since sleep apnea is mentioned as a possibility, insurance will not accept an ONO for nocturnal O2- patient must have an in-lab sleep study.    TP please advise if ok to order a sleep study for pt.  Thanks .

## 2017-05-30 NOTE — Telephone Encounter (Signed)
PCC's can you please help with this?  We already placed the o2 order   Please see her email:  Hi [name] - I have not heard from [place] about having oxygen while I sleep. I called and after 25 minute hold they said they had no order for it from my Doctor. I also noticed that it is in my chart as  PULSE OXIMETRY, OVERNIGHTexpiring 6/4 - There was also the order from my 1st oximetry. Just wanted to let someone know.  I also wondered if there was another company that you prefer over [facility]?I chose them because they are close to my home.  Thank you, Ronnell [facility]

## 2017-05-30 NOTE — Telephone Encounter (Signed)
Left message for Corene Cornea to call back.

## 2017-05-30 NOTE — Telephone Encounter (Signed)
Patty [name], [facility], returning call, CB is [phone] ext [phone].

## 2017-05-30 NOTE — Telephone Encounter (Signed)
Ok , please switch DME companies  Also please refer back to ask Corene Cornea exactly what inhalers are approved for ILD , I am interested in that new research .  She does not have OSA nor has she been dx . She does have hypoxia that is documented there fore I have ordered oxygen appropriately .  Please change DME ASAP

## 2017-05-31 ENCOUNTER — Encounter: Payer: Self-pay | Admitting: Adult Health

## 2017-05-31 ENCOUNTER — Other Ambulatory Visit: Payer: Medicare Other

## 2017-05-31 ENCOUNTER — Telehealth: Payer: Self-pay | Admitting: Adult Health

## 2017-05-31 DIAGNOSIS — J849 Interstitial pulmonary disease, unspecified: Principal | ICD-10-CM

## 2017-05-31 DIAGNOSIS — J8409 Other alveolar and parieto-alveolar conditions: Secondary | ICD-10-CM

## 2017-05-31 LAB — ALDOLASE

## 2017-05-31 LAB — ANGIOTENSIN CONVERTING ENZYME: Angiotensin-Converting Enzyme: 30 U/L (ref 9–67)

## 2017-05-31 NOTE — Telephone Encounter (Signed)
Left a message for Patty [name] to call back for more details.

## 2017-05-31 NOTE — Telephone Encounter (Signed)
Per TP, please switch DME ASAP for new O2 start.  Order placed - see 05/31/17 note.  Nothing further needed.

## 2017-05-31 NOTE — Telephone Encounter (Signed)
Spoke with Patty [name] and notified of recs per TP  He is unsure of any covered inhalers for ILD, and states that "I am not a clinician, this went to clinical review" He hopes to have an answer to the question later today  Order sent to [facility] for new DME asap  Will forward to TP to let her know

## 2017-05-31 NOTE — Telephone Encounter (Signed)
Per TP, switch DME asap  See 05/31/17 telephone note.  Order placed for DME change for O2 start.   LM x 1 to make pt aware of this order being placed to change DME.   Will send back to TP to make aware this was taken care of.

## 2017-06-01 DIAGNOSIS — G4733 Obstructive sleep apnea (adult) (pediatric): Secondary | ICD-10-CM | POA: Diagnosis not present

## 2017-06-01 LAB — HYPERSENSITIVITY PNEUMONITIS
A. Pullulans Abs: NEGATIVE
A.Fumigatus #1 Abs: NEGATIVE
Micropolyspora faeni, IgG: NEGATIVE
Pigeon Serum Abs: NEGATIVE
Thermoact. Saccharii: NEGATIVE
Thermoactinomyces vulgaris, IgG: NEGATIVE

## 2017-06-01 NOTE — Telephone Encounter (Signed)
lmomtcb x 2 for the pt.  Wanted to let her know we have sent the order to a different DME company.

## 2017-06-01 NOTE — Telephone Encounter (Signed)
Patient returned call about this email.  The call got dropped before I could get any details or a CB number for her.

## 2017-06-01 NOTE — Telephone Encounter (Signed)
lmtcb x1 for pt. 

## 2017-06-02 ENCOUNTER — Telehealth: Payer: Self-pay | Admitting: Internal Medicine

## 2017-06-02 DIAGNOSIS — G4733 Obstructive sleep apnea (adult) (pediatric): Secondary | ICD-10-CM | POA: Diagnosis not present

## 2017-06-02 NOTE — Telephone Encounter (Signed)
Pt is aware of results per CY and understands she needs CPAP and must use this prior to any O2 being ordered. Insurance will not allow O2 since HST shows OSA. Pt agreed to get CPAP set up and order has been placed.

## 2017-06-06 ENCOUNTER — Other Ambulatory Visit: Payer: Self-pay | Admitting: *Deleted

## 2017-06-06 ENCOUNTER — Encounter: Payer: Self-pay | Admitting: Adult Health

## 2017-06-06 DIAGNOSIS — J849 Interstitial pulmonary disease, unspecified: Secondary | ICD-10-CM

## 2017-06-06 DIAGNOSIS — I272 Pulmonary hypertension, unspecified: Secondary | ICD-10-CM

## 2017-06-09 ENCOUNTER — Ambulatory Visit (HOSPITAL_COMMUNITY): Payer: Medicare Other | Attending: Internal Medicine

## 2017-06-09 ENCOUNTER — Other Ambulatory Visit: Payer: Self-pay

## 2017-06-09 DIAGNOSIS — I5030 Unspecified diastolic (congestive) heart failure: Secondary | ICD-10-CM | POA: Insufficient documentation

## 2017-06-09 DIAGNOSIS — I272 Pulmonary hypertension, unspecified: Secondary | ICD-10-CM | POA: Diagnosis not present

## 2017-06-12 ENCOUNTER — Encounter: Payer: Self-pay | Admitting: Adult Health

## 2017-06-12 DIAGNOSIS — G4733 Obstructive sleep apnea (adult) (pediatric): Secondary | ICD-10-CM

## 2017-06-12 NOTE — Telephone Encounter (Signed)
TP please advise on below email.  Thanks!   Hi [name] - I started the CPAP machine Friday night. I do notice a difference in feeling more rested however I have been experiencing aerophagia (Mostly bloating and diarrhea). This is starting to feel like Crohns activity. Will it aggravate my Crohns? Would an APAP machine or an autoset type device eliviate these symptoms? I think I also wake up early morning with a stuffed nose and nose spray has nor saline has not helped. Could this be due t still a lack of oxygen? I have 30 days to exchange the machine type which is why I am contacting you now.  Thank you!

## 2017-06-13 NOTE — Telephone Encounter (Signed)
Per TP: when did she start O2?  What pressure setting is she on?  Type of mask?  Her pressure may need to be adjusted.    CPAP was ordered on 6.8.18, with a pressure setting of auto 5-20.  Per TP: let's decrease her pressure to auto 5-15 and can set up for mask fitting.    E-mail sent to patient letting her know

## 2017-06-13 NOTE — Progress Notes (Signed)
Cardiology Office Note   Date:  06/15/2017   ID:  Patty [name], DOB 15-Jun-1951, MRN [id number]  PCP:  Patient, No Pcp Per  Cardiologist:   Jenkins Rouge, MD   No chief complaint on file.     History of Present Illness: Patty [name] is a 66 y.o. female who presents for evaluation of CAD.  Referred by Rexene Edison NP Reviewed her office note from 05/29/17.  Being seen for ILD and OSA  Has Crohns disease and being w/u for  Autoimmune lung disease On CPAP with nocturnal desats . CT scan chest 05/13/17 reviewed.   IMPRESSION: 1. Suspect very mild basilar predominant subpleural reticulation and bronchiolectasis, which may be due to nonspecific interstitial pneumonitis. 2. Aortic atherosclerosis (ICD10-170.0). Coronary artery calcification. 3. Enlarged pulmonary arteries, indicative of pulmonary arterial hypertension.  F/U echo reviewed from 06/09/17  No valve disease No pulmonary HTN  EF 60-65%   Labs reviewed and only abnormality noted was ESR 56  Minimal risk factors for CAD quit smoking 8 years ago  She is frustrated by not being able to lose weight. She ascribes it to Pvera, Pernicious anemia and OSA Thinks these things slowed her metabolism   Past Medical History:  Diagnosis Date  . Anemia   . Crohn disease ([place])   . Polycythemia vera ([place])     Past Surgical History:  Procedure Laterality Date  . COLON RESECTION    . HIP ARTHROPLASTY       Current Outpatient Prescriptions  Medication Sig Dispense Refill  . B Complex-C (B-COMPLEX WITH VITAMIN C) tablet Take 1 tablet by mouth daily.    . calcium carbonate (OS-CAL) 1250 MG chewable tablet Chew 1 tablet by mouth daily.    . Cetirizine-Pseudoephedrine (ZYRTEC-D PO) Take 1 tablet by mouth daily as needed.    . Cyanocobalamin (VITAMIN B-12 IJ) Inject as directed.    . Multiple Vitamins-Minerals (ICAPS AREDS 2 PO) Take 1 tablet by mouth as needed.    . traMADol (ULTRAM) 50 MG tablet Take 50 mg by mouth every 6  (six) hours as needed.    [name] VITAMIN D, CHOLECALCIFEROL, PO Take 1 capsule by mouth daily.     No current facility-administered medications for this visit.     Allergies:   Prednisone and Sulfa drugs cross reactors    Social History:  The patient  reports that she quit smoking about 8 years ago. Her smoking use included Cigarettes. She has a 45.00 pack-year smoking history. She has never used smokeless tobacco. She reports that she drinks alcohol. She reports that she does not use drugs.   Family History:  The patient's family history includes COPD in her brother; Diabetes Mellitus II in her brother and father; Glaucoma in her brother and mother; Heart disease in her brother; Non-Hodgkin's lymphoma in her brother.    ROS:  Please see the history of present illness.   Otherwise, review of systems are positive for none.   All other systems are reviewed and negative.    PHYSICAL EXAM: VS:  BP 138/84 (BP Location: Left Arm)   Pulse 88   Ht _0  (1.651 m)   Wt 108.8 kg (239 lb 12.8 oz)   BMI 39.90 kg/m  , BMI Body mass index is 39.9 kg/m. Affect appropriate Obese kyphotic female  HEENT: normal Neck supple with no adenopathy JVP normal no bruits no thyromegaly Lungs clear with no wheezing and good diaphragmatic motion Heart:  S1/S2 no murmur, no rub, gallop or  click PMI normal Abdomen: benighn, BS positve, no tenderness, no AAA no bruit.  No HSM or HJR Distal pulses intact with no bruits Plus one  Edema bilateral with varicosities  Neuro non-focal Skin warm and dry No muscular weakness    EKG:  NSR rate 88 LAD low voltage 06/15/2017    Recent Labs: No results found for requested labs within last 8760 hours.    Lipid Panel No results found for: CHOL, TRIG, HDL, CHOLHDL, VLDL, LDLCALC, LDLDIRECT    Wt Readings from Last 3 Encounters:  06/15/17 108.8 kg (239 lb 12.8 oz)  05/29/17 109 kg (240 lb 3.2 oz)  05/09/17 122.7 kg (270 lb 9.6 oz)      Other studies  Reviewed: Additional studies/ records that were reviewed today include: Notes [name] Parrett, labs and chest CT.    ASSESSMENT AND PLAN:  1.  CAD seen on CT with dyspnea f/u lexiscan myvovue 2. ILD exam not classic suspect she has combination of restrictive disease due to obesity And OSA  3. Elevated ESR  F/u primary to consider trial of steroids  4. OSA/CPAP tolerating CPAP last 4 days f/u pulmonary    Current medicines are reviewed at length with the patient today.  The patient does not have concerns regarding medicines.  The following changes have been made:  no change  Labs/ tests ordered today include: Lexiscan myovue   Orders Placed This Encounter  Procedures  . Myocardial Perfusion Imaging     Disposition:   FU with cardiology PRN      Signed, Jenkins Rouge, MD  06/15/2017 3:13 PM    [facility] [place], [place],   [address] Phone: [phone]; Fax: [phone]

## 2017-06-14 NOTE — Telephone Encounter (Signed)
TP  Please Advise- pt email  Patty [name]  to Melvenia Needles, NP       8:19 AM  Thank you! I have an AirFit N20 for Her. Nasal. I have been trying different things such as sitting up more and adjusting my mask

## 2017-06-14 NOTE — Telephone Encounter (Signed)
Spoke with TP:  Yes, a mask fitting session will likely help since she has a nasal mask, or we can add a chin strap.  Since the mask fitting session has already been ordered, let's continue with that.  Thanks.  E-mail sent to patient

## 2017-06-15 ENCOUNTER — Ambulatory Visit (INDEPENDENT_AMBULATORY_CARE_PROVIDER_SITE_OTHER): Payer: Medicare Other | Admitting: Cardiovascular Disease

## 2017-06-15 ENCOUNTER — Encounter: Payer: Self-pay | Admitting: Cardiovascular Disease

## 2017-06-15 VITALS — BP 138/84 | HR 88 | Ht 65.0 in | Wt 239.8 lb

## 2017-06-15 DIAGNOSIS — I2510 Atherosclerotic heart disease of native coronary artery without angina pectoris: Secondary | ICD-10-CM | POA: Diagnosis not present

## 2017-06-15 DIAGNOSIS — K5090 Crohn's disease, unspecified, without complications: Secondary | ICD-10-CM | POA: Insufficient documentation

## 2017-06-15 NOTE — Patient Instructions (Addendum)
Medication Instructions:  Your physician recommends that you continue on your current medications as directed. Please refer to the Current Medication list given to you today.  Labwork: NONE  Testing/Procedures: Your physician has requested that you have a lexiscan myoview. For further information please visit [email or web address]. Please follow instruction sheet, as given.  Follow-Up: Your physician wants you to follow-up as needed with Dr. Johnsie Cancel.    If you need a refill on your cardiac medications before your next appointment, please call your pharmacy.

## 2017-06-15 NOTE — Addendum Note (Signed)
Addended by: Aris Georgia, Claudell Wohler L on: 06/15/2017 04:35 PM   Modules accepted: Orders

## 2017-06-27 ENCOUNTER — Ambulatory Visit (HOSPITAL_BASED_OUTPATIENT_CLINIC_OR_DEPARTMENT_OTHER): Payer: Medicare Other | Attending: Adult Health | Admitting: Radiology

## 2017-06-27 DIAGNOSIS — G4733 Obstructive sleep apnea (adult) (pediatric): Secondary | ICD-10-CM

## 2017-07-04 ENCOUNTER — Encounter: Payer: Self-pay | Admitting: Adult Health

## 2017-07-08 ENCOUNTER — Encounter: Payer: Self-pay | Admitting: Internal Medicine

## 2017-07-10 ENCOUNTER — Ambulatory Visit (HOSPITAL_COMMUNITY): Payer: Medicare Other

## 2017-07-11 ENCOUNTER — Telehealth (HOSPITAL_COMMUNITY): Payer: Self-pay | Admitting: *Deleted

## 2017-07-11 ENCOUNTER — Ambulatory Visit (HOSPITAL_COMMUNITY): Payer: Medicare Other

## 2017-07-11 NOTE — Telephone Encounter (Signed)
Patient given detailed instructions per Myocardial Perfusion Study Information Sheet for the test on 07/13/17. Patient notified to arrive 15 minutes early and that it is imperative to arrive on time for appointment to keep from having the test rescheduled.  If you need to cancel or reschedule your appointment, please call the office within 24 hours of your appointment. . Patient verbalized understanding.Patty [name]

## 2017-07-13 ENCOUNTER — Ambulatory Visit (HOSPITAL_COMMUNITY): Payer: Medicare Other | Attending: Cardiology

## 2017-07-13 DIAGNOSIS — I2510 Atherosclerotic heart disease of native coronary artery without angina pectoris: Secondary | ICD-10-CM

## 2017-07-13 MED ORDER — REGADENOSON 0.4 MG/5ML IV SOLN
0.4000 mg | Freq: Once | INTRAVENOUS | Status: AC
  Administered 2017-07-13: 0.4 mg via INTRAVENOUS

## 2017-07-13 MED ORDER — TECHNETIUM TC 99M TETROFOSMIN IV KIT
32.8000 | PACK | Freq: Once | INTRAVENOUS | Status: AC | PRN
  Administered 2017-07-13: 32.8 via INTRAVENOUS
  Filled 2017-07-13: qty 33

## 2017-07-14 ENCOUNTER — Ambulatory Visit (HOSPITAL_COMMUNITY): Payer: Medicare Other | Attending: Cardiovascular Disease

## 2017-07-14 LAB — MYOCARDIAL PERFUSION IMAGING
LV dias vol: 75 mL (ref 46–106)
LV sys vol: 36 mL
Peak HR: 117 {beats}/min
RATE: 0.38
Rest HR: 100 {beats}/min
SDS: 5
SRS: 9
SSS: 12
TID: 1.02

## 2017-07-14 MED ORDER — TECHNETIUM TC 99M TETROFOSMIN IV KIT
30.3000 | PACK | Freq: Once | INTRAVENOUS | Status: AC | PRN
  Administered 2017-07-14: 30.3 via INTRAVENOUS
  Filled 2017-07-14: qty 31

## 2017-07-25 ENCOUNTER — Other Ambulatory Visit: Payer: Medicare Other

## 2017-07-25 ENCOUNTER — Encounter: Payer: Self-pay | Admitting: Cardiovascular Disease

## 2017-07-25 ENCOUNTER — Telehealth: Payer: Self-pay

## 2017-07-25 DIAGNOSIS — Z01812 Encounter for preprocedural laboratory examination: Secondary | ICD-10-CM | POA: Diagnosis not present

## 2017-07-25 DIAGNOSIS — D508 Other iron deficiency anemias: Secondary | ICD-10-CM | POA: Diagnosis not present

## 2017-07-25 DIAGNOSIS — R9439 Abnormal result of other cardiovascular function study: Secondary | ICD-10-CM

## 2017-07-25 DIAGNOSIS — R0602 Shortness of breath: Secondary | ICD-10-CM | POA: Diagnosis not present

## 2017-07-25 NOTE — Telephone Encounter (Signed)
-----  Message from Josue Hector, MD sent at 07/22/2017 12:04 PM EDT ----- myovue abnormal set up for cath next week not a good candidate for CT

## 2017-07-25 NOTE — Telephone Encounter (Signed)
Called patient back. Went over instructions for heart cath with patient. Patient will come in today for lab work. Patient is concerned about her past experiences with procedures and surgeries. Patient stated she has had blood clots (PE) before in the past, and wants to know if any one needs to consult her hematologist, Dr. Wendee Beavers before her procedure. Patient stated she does take ASA 81 mg daily. Will add to her medication list. Will forward to Dr. Johnsie Cancel.

## 2017-07-25 NOTE — Telephone Encounter (Signed)
Patient is scheduled for heart cath this Friday with Dr. Angelena Form. Waiting for patient to call back to give her instructions for heart cath.

## 2017-07-25 NOTE — Telephone Encounter (Signed)
Follow up     Patient returning call back to Dr. Johnsie Cancel nurse from today.

## 2017-07-26 ENCOUNTER — Encounter: Payer: Self-pay | Admitting: Cardiovascular Disease

## 2017-07-26 LAB — CBC WITH DIFFERENTIAL/PLATELET
Basophils Absolute: 0 10*3/uL (ref 0.0–0.2)
Basos: 0 %
EOS (ABSOLUTE): 0.2 10*3/uL (ref 0.0–0.4)
Eos: 2 %
Hematocrit: 44.1 % (ref 34.0–46.6)
Hemoglobin: 14.6 g/dL (ref 11.1–15.9)
Immature Grans (Abs): 0 10*3/uL (ref 0.0–0.1)
Immature Granulocytes: 0 %
Lymphocytes Absolute: 1.8 10*3/uL (ref 0.7–3.1)
Lymphs: 25 %
MCH: 30.4 pg (ref 26.6–33.0)
MCHC: 33.1 g/dL (ref 31.5–35.7)
MCV: 92 fL (ref 79–97)
Monocytes Absolute: 0.6 10*3/uL (ref 0.1–0.9)
Monocytes: 8 %
Neutrophils Absolute: 4.7 10*3/uL (ref 1.4–7.0)
Neutrophils: 65 %
Platelets: 342 10*3/uL (ref 150–379)
RBC: 4.81 x10E6/uL (ref 3.77–5.28)
RDW: 14.8 % (ref 12.3–15.4)
WBC: 7.4 10*3/uL (ref 3.4–10.8)

## 2017-07-26 LAB — BASIC METABOLIC PANEL
BUN/Creatinine Ratio: 18 (ref 12–28)
BUN: 14 mg/dL (ref 8–27)
CO2: 23 mmol/L (ref 20–29)
Calcium: 9.5 mg/dL (ref 8.7–10.3)
Chloride: 104 mmol/L (ref 96–106)
Creatinine, Ser: 0.77 mg/dL (ref 0.57–1.00)
GFR calc Af Amer: 93 mL/min/{1.73_m2} (ref >59)
GFR calc non Af Amer: 81 mL/min/{1.73_m2} (ref >59)
Glucose: 126 mg/dL — ABNORMAL HIGH (ref 65–99)
Potassium: 4.7 mmol/L (ref 3.5–5.2)
Sodium: 140 mmol/L (ref 134–144)

## 2017-07-26 LAB — PROTIME-INR
INR: 1 (ref 0.8–1.2)
Prothrombin Time: 9.9 s (ref 9.1–12.0)

## 2017-07-27 ENCOUNTER — Telehealth: Payer: Self-pay

## 2017-07-27 NOTE — Telephone Encounter (Signed)
Patient contacted pre-catheterization at [facility] scheduled for:  07/28/2017 @ 1200 Verified arrival time and place:  NT @ 1000 Confirmed AM meds to be taken pre-cath with sip of water: Notified to take ASA 81 mg prior to arrival Confirmed patient has responsible person to drive home post procedure and observe patient for 24 hours: Pt does not have anyone to stay overnight with her if discharged.

## 2017-07-28 ENCOUNTER — Encounter (HOSPITAL_COMMUNITY)
Admission: RE | Disposition: A | Discharge status: Home / Self Care | Payer: Self-pay | Source: Ambulatory Visit | Attending: Cardiovascular Disease

## 2017-07-28 ENCOUNTER — Ambulatory Visit (HOSPITAL_COMMUNITY)
Admission: RE | Admit: 2017-07-28 | Discharge: 2017-07-29 | Disposition: A | Discharge status: Home / Self Care | Payer: Medicare Other | Source: Ambulatory Visit | Attending: Cardiovascular Disease | Admitting: Cardiovascular Disease

## 2017-07-28 DIAGNOSIS — Z7951 Long term (current) use of inhaled steroids: Secondary | ICD-10-CM | POA: Diagnosis not present

## 2017-07-28 DIAGNOSIS — Z8572 Personal history of non-Hodgkin lymphomas: Secondary | ICD-10-CM | POA: Diagnosis not present

## 2017-07-28 DIAGNOSIS — J849 Interstitial pulmonary disease, unspecified: Secondary | ICD-10-CM | POA: Diagnosis present

## 2017-07-28 DIAGNOSIS — D45 Polycythemia vera: Secondary | ICD-10-CM | POA: Diagnosis not present

## 2017-07-28 DIAGNOSIS — Z9049 Acquired absence of other specified parts of digestive tract: Secondary | ICD-10-CM | POA: Insufficient documentation

## 2017-07-28 DIAGNOSIS — Z8603 Personal history of neoplasm of uncertain behavior: Secondary | ICD-10-CM | POA: Diagnosis present

## 2017-07-28 DIAGNOSIS — Z7982 Long term (current) use of aspirin: Secondary | ICD-10-CM | POA: Diagnosis not present

## 2017-07-28 DIAGNOSIS — J449 Chronic obstructive pulmonary disease, unspecified: Secondary | ICD-10-CM | POA: Insufficient documentation

## 2017-07-28 DIAGNOSIS — R9439 Abnormal result of other cardiovascular function study: Secondary | ICD-10-CM | POA: Diagnosis not present

## 2017-07-28 DIAGNOSIS — K5090 Crohn's disease, unspecified, without complications: Secondary | ICD-10-CM | POA: Diagnosis present

## 2017-07-28 DIAGNOSIS — G4733 Obstructive sleep apnea (adult) (pediatric): Secondary | ICD-10-CM | POA: Diagnosis not present

## 2017-07-28 DIAGNOSIS — Z87891 Personal history of nicotine dependence: Secondary | ICD-10-CM | POA: Diagnosis not present

## 2017-07-28 DIAGNOSIS — Z9989 Dependence on other enabling machines and devices: Secondary | ICD-10-CM

## 2017-07-28 DIAGNOSIS — R0602 Shortness of breath: Secondary | ICD-10-CM | POA: Diagnosis present

## 2017-07-28 DIAGNOSIS — Z862 Personal history of diseases of the blood and blood-forming organs and certain disorders involving the immune mechanism: Secondary | ICD-10-CM | POA: Diagnosis present

## 2017-07-28 DIAGNOSIS — R0600 Dyspnea, unspecified: Secondary | ICD-10-CM | POA: Diagnosis present

## 2017-07-28 DIAGNOSIS — E119 Type 2 diabetes mellitus without complications: Secondary | ICD-10-CM | POA: Diagnosis not present

## 2017-07-28 HISTORY — PX: LEFT HEART CATH AND CORONARY ANGIOGRAPHY: CATH118249

## 2017-07-28 HISTORY — DX: Obstructive sleep apnea (adult) (pediatric): G47.33

## 2017-07-28 HISTORY — DX: Abnormal result of other cardiovascular function study: R94.39

## 2017-07-28 HISTORY — DX: Dependence on other enabling machines and devices: Z99.89

## 2017-07-28 SURGERY — LEFT HEART CATH AND CORONARY ANGIOGRAPHY
Anesthesia: LOCAL

## 2017-07-28 MED ORDER — VERAPAMIL HCL 2.5 MG/ML IV SOLN
INTRAVENOUS | Status: DC | PRN
  Administered 2017-07-28: 14:00:00 via INTRA_ARTERIAL

## 2017-07-28 MED ORDER — IOPAMIDOL (ISOVUE-370) INJECTION 76%
INTRAVENOUS | Status: AC
  Filled 2017-07-28: qty 100

## 2017-07-28 MED ORDER — SODIUM CHLORIDE 0.9 % WEIGHT BASED INFUSION: 1.0000 mL/kg/h | INTRAVENOUS | Status: DC

## 2017-07-28 MED ORDER — HEPARIN (PORCINE) IN NACL 2-0.9 UNIT/ML-% IJ SOLN
INTRAMUSCULAR | Status: AC
  Filled 2017-07-28: qty 1000

## 2017-07-28 MED ORDER — ACETAMINOPHEN 325 MG PO TABS: 650.0000 mg | ORAL_TABLET | ORAL | Status: DC | PRN

## 2017-07-28 MED ORDER — IOPAMIDOL (ISOVUE-370) INJECTION 76%
INTRAVENOUS | Status: DC | PRN
  Administered 2017-07-28: 75 mL via INTRA_ARTERIAL

## 2017-07-28 MED ORDER — MIDAZOLAM HCL 2 MG/2ML IJ SOLN
INTRAMUSCULAR | Status: AC
  Filled 2017-07-28: qty 2

## 2017-07-28 MED ORDER — ASPIRIN 81 MG PO CHEW: 81.0000 mg | CHEWABLE_TABLET | ORAL | Status: DC

## 2017-07-28 MED ORDER — HEPARIN (PORCINE) IN NACL 2-0.9 UNIT/ML-% IJ SOLN
INTRAMUSCULAR | Status: AC | PRN
  Administered 2017-07-28: 1000 mL

## 2017-07-28 MED ORDER — ASPIRIN EC 81 MG PO TBEC
81.0000 mg | DELAYED_RELEASE_TABLET | Freq: Every day | ORAL | Status: DC
  Administered 2017-07-29: 81 mg via ORAL
  Filled 2017-07-28: qty 1

## 2017-07-28 MED ORDER — FENTANYL CITRATE (PF) 100 MCG/2ML IJ SOLN
INTRAMUSCULAR | Status: DC | PRN
  Administered 2017-07-28 (×2): 25 ug via INTRAVENOUS

## 2017-07-28 MED ORDER — SODIUM CHLORIDE 0.9% FLUSH: 3.0000 mL | INTRAVENOUS | Status: DC | PRN

## 2017-07-28 MED ORDER — LIDOCAINE HCL (PF) 1 % IJ SOLN
INTRAMUSCULAR | Status: AC
  Filled 2017-07-28: qty 30

## 2017-07-28 MED ORDER — HEPARIN SODIUM (PORCINE) 1000 UNIT/ML IJ SOLN
INTRAMUSCULAR | Status: AC
  Filled 2017-07-28: qty 1

## 2017-07-28 MED ORDER — SODIUM CHLORIDE 0.9 % IV SOLN: 250.0000 mL | INTRAVENOUS | Status: DC | PRN

## 2017-07-28 MED ORDER — SODIUM CHLORIDE 0.9% FLUSH: 3.0000 mL | Freq: Two times a day (BID) | INTRAVENOUS | Status: DC

## 2017-07-28 MED ORDER — FENTANYL CITRATE (PF) 100 MCG/2ML IJ SOLN
INTRAMUSCULAR | Status: AC
  Filled 2017-07-28: qty 2

## 2017-07-28 MED ORDER — SODIUM CHLORIDE 0.9 % IV SOLN
INTRAVENOUS | Status: AC
  Administered 2017-07-28: 16:00:00 via INTRAVENOUS

## 2017-07-28 MED ORDER — TRAZODONE 25 MG HALF TABLET
25.0000 mg | ORAL_TABLET | Freq: Every evening | ORAL | Status: DC | PRN
  Filled 2017-07-28: qty 2

## 2017-07-28 MED ORDER — SODIUM CHLORIDE 0.9 % WEIGHT BASED INFUSION
3.0000 mL/kg/h | INTRAVENOUS | Status: DC
  Administered 2017-07-28: 3 mL/kg/h via INTRAVENOUS

## 2017-07-28 MED ORDER — MIDAZOLAM HCL 2 MG/2ML IJ SOLN
INTRAMUSCULAR | Status: DC | PRN
  Administered 2017-07-28 (×2): 1 mg via INTRAVENOUS

## 2017-07-28 MED ORDER — LIDOCAINE HCL (PF) 1 % IJ SOLN
INTRAMUSCULAR | Status: DC | PRN
  Administered 2017-07-28: 16 mL
  Administered 2017-07-28: 2 mL

## 2017-07-28 MED ORDER — VERAPAMIL HCL 2.5 MG/ML IV SOLN
INTRAVENOUS | Status: AC
  Filled 2017-07-28: qty 2

## 2017-07-28 MED ORDER — ONDANSETRON HCL 4 MG/2ML IJ SOLN: 4.0000 mg | Freq: Four times a day (QID) | INTRAMUSCULAR | Status: DC | PRN

## 2017-07-28 SURGICAL SUPPLY — 16 items
CATH 5FR JL3.5 JR4 ANG PIG MP (CATHETERS) ×2 IMPLANT
CATH INFINITI 5FR JL4 (CATHETERS) ×2 IMPLANT
COVER PRB 48X5XTLSCP FOLD TPE (BAG) IMPLANT
COVER PROBE 5X48 (BAG)
DEVICE RAD COMP TR BAND LRG (VASCULAR PRODUCTS) ×2 IMPLANT
GLIDESHEATH SLEND SS 6F .021 (SHEATH) ×2 IMPLANT
GUIDEWIRE INQWIRE 1.5J.035X260 (WIRE) ×1 IMPLANT
INQWIRE 1.5J .035X260CM (WIRE) ×2
KIT HEART LEFT (KITS) ×2 IMPLANT
PACK CARDIAC CATHETERIZATION (CUSTOM PROCEDURE TRAY) ×2 IMPLANT
SHEATH PINNACLE 5F 10CM (SHEATH) ×2 IMPLANT
TRANSDUCER W/STOPCOCK (MISCELLANEOUS) ×2 IMPLANT
TUBING CIL FLEX 10 FLL-RA (TUBING) ×2 IMPLANT
WIRE COUGAR XT STRL 190CM (WIRE) ×2 IMPLANT
WIRE EMERALD 3MM-J .035X150CM (WIRE) ×2 IMPLANT
WIRE HI TORQ VERSACORE-J 145CM (WIRE) ×2 IMPLANT

## 2017-07-28 NOTE — Progress Notes (Signed)
Patient arrived from cath lab on stretcher with TR band and right femoral sheath in place and ready to be pulled. Right pedal pulse palpable and +2, right radial pulse pulse +1, VS stable and WNLs.

## 2017-07-28 NOTE — Interval H&P Note (Signed)
History and Physical Interval Note:  07/28/2017 12:19 PM  Patty [name]  has presented today for cardiac cath with the diagnosis of abnormal stress test, CAD noted on cT scan. The various methods of treatment have been discussed with the patient and family. After consideration of risks, benefits and other options for treatment, the patient has consented to  Procedure(s): Left Heart Cath and Coronary Angiography (N/A) as a surgical intervention .  The patient's history has been reviewed, patient examined, no change in status, stable for surgery.  I have reviewed the patient's chart and labs.  Questions were answered to the patient's satisfaction.    Cath Lab Visit (complete for each Cath Lab visit)  Clinical Evaluation Leading to the Procedure:   ACS: No.  Non-ACS:    Anginal Classification: CCS I  Anti-ischemic medical therapy: No Therapy  Non-Invasive Test Results: Intermediate-risk stress test findings: cardiac mortality 1-3%/year  Prior CABG: No previous CABG         [name]

## 2017-07-28 NOTE — Care Management Note (Signed)
Case Management Note  Patient Details  Name: Peola Joynt MRN: [id number] Date of Birth: 1951-01-02  Subjective/Objective:  From home  With spouse, s/p cath, no intervention.  Plan for dc tomorrow.   No needs.                Action/Plan: NCM will follow for dc needs.   Expected Discharge Date:                  Expected Discharge Plan:  Home/Self Care  In-House Referral:     Discharge planning Services  CM Consult  Post Acute Care Choice:    Choice offered to:     DME Arranged:    DME Agency:     HH Arranged:    [place] Agency:     Status of Service:  Completed, signed off  If discussed at [employer] of Stay Meetings, dates discussed:    Additional Comments:  Zenon Mayo, RN 07/28/2017, 5:29 PM

## 2017-07-28 NOTE — Progress Notes (Signed)
CSW met with pt and Arville Go (pt's friend at bedside) to discuss concerns. CSW was informed by pt that the procedure pt is having will require that pt have 24 hour assistance after the procedure. Pt is able to go home after the procedure, however pt does not have anyone, resulting in pt being admitted. Pt presented concerns about pt's insurance and how much they will pay for the admission. CSW informed pt that pt may receive a bill but wasn't sure .CSW consulted with RN CM and CM reached out to pt's friend to updated her by telling her that pt would have to reach out to financial  counselors or registration to find that information out.     Virgie Dad Gabrianna Fassnacht, MSW, [place] Clinical Social Worker [phone]

## 2017-07-28 NOTE — Progress Notes (Signed)
Right femoral artery sheath pulled at 1450, manual pressure held for 20 minutes. Hemostasis achieved and bedrest to begin at 1510. Pedal pulses palpable and +2. Unable to remove any air from TR band due to bleeding. Radial pulse +1. Report called to [facility].

## 2017-07-28 NOTE — Progress Notes (Signed)
TR BAND REMOVAL  LOCATION:    right radial  DEFLATED PER PROTOCOL:    Yes.    TIME BAND OFF / DRESSING APPLIED:    1830   SITE UPON ARRIVAL:    Level 0  SITE AFTER BAND REMOVAL:    Level 0  CIRCULATION SENSATION AND MOVEMENT:    Within Normal Limits   Yes.    COMMENTS:

## 2017-07-28 NOTE — H&P (Signed)
Patient ID: Lynel Forester MRN: [id number] DOB/AGE: May 24, 1951 66 y.o. Admit date: 07/28/2017  Primary Care Physician: Patient, No Pcp Per Primary Cardiologist: Johnsie Cancel  HPI: 66 yo femal with h/o DM, COPD, NHL and coronary calcium on CT scan with follow up abnormal stress test referred today for cath. No chest pain.   Review of systems complete and found to be negative unless listed above   Past Medical History:  Diagnosis Date  . Anemia   . Crohn disease ([place])   . Polycythemia vera ([place])     Family History  Problem Relation Age of Onset  . Glaucoma Mother   . Diabetes Mellitus II Father   . COPD Brother   . Heart disease Brother   . Non-Hodgkin's lymphoma Brother   . Diabetes Mellitus II Brother   . Glaucoma Brother     Social History   Social History  . Marital status: Single    Spouse name: N/A  . Number of children: N/A  . Years of education: N/A   Occupational History  . retired    Social History Main Topics  . Smoking status: Former Smoker    Packs/day: 1.00    Years: 45.00    Types: Cigarettes    Quit date: 12/26/2008  . Smokeless tobacco: Never Used  . Alcohol use Yes     Comment: occ  . Drug use: No  . Sexual activity: Not on file   Other Topics Concern  . 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     Past Surgical History:  Procedure Laterality Date  . COLON RESECTION    . HIP ARTHROPLASTY      Allergies  Allergen Reactions  . Onion Other (See Comments)    sereve diarrhea, stomach pains (Crohn's flares)  . Oxycodone Other (See Comments)    Altered mental status  . Prednisone Other (See Comments)    Altered mood  . Sulfa Drugs Cross Reactors Other (See Comments)    "feels like bugs crawling on me"    Prior to Admission Meds:  Prior to Admission medications   Medication Sig Start Date End Date Taking? Authorizing Provider  aspirin EC 81 MG tablet Take  81 mg by mouth daily.   Yes [provider]  B Complex-C (B-COMPLEX WITH VITAMIN C) tablet Take 1 tablet by mouth daily.   Yes [provider]  Biotin 5000 MCG SUBL Place 5,000 mcg under the tongue daily.   Yes [provider]  Cetirizine-Pseudoephedrine (ZYRTEC-D PO) Take 0.5-1 tablets by mouth daily as needed (for sinus congestion/allergies.).    Yes [provider]  Cholecalciferol (VITAMIN D) 2000 units tablet Take 2,000 Units by mouth daily.   Yes [provider]  Chromium 500 MCG TABS Take 500 mcg by mouth every 3 (three) days.   Yes [provider]  clonazePAM (KLONOPIN) 0.5 MG tablet Take 0.125-0.25 mg by mouth daily as needed (for anxiety or sleep.).   Yes [provider]  cyanocobalamin (,VITAMIN B-12,) 1000 MCG/ML injection Inject 1,000 mcg into the muscle every 30 (thirty) days. Pernicious anemia   Yes [provider]  Cyanocobalamin (VITAMIN B-12) 5000 MCG SUBL Place 5,000 mcg under the tongue daily.   Yes [provider]  cyclopentolate (CYCLODRYL,CYCLOGYL) 2 % ophthalmic solution Place 1 drop into both eyes 3 (three) times daily as needed (for uveitis or iritis).  Yes [provider]  fluticasone (FLONASE) 50 MCG/ACT nasal spray Place 1 spray into both nostrils daily as needed for allergies. 07/03/17  Yes [provider]  Magnesium 250 MG TABS Take 250 mg by mouth every other day.   Yes [provider]  Misc Natural Products (FOCUSED MIND PO) Take 1 tablet by mouth every 3 (three) days. FOCUS FACTOR NUTRITIONAL SUPPLEMENT   Yes [provider]  Multiple Vitamins-Minerals (ICAPS AREDS 2 PO) Take 1 tablet by mouth every 3 (three) days.    Yes [provider]  Niacinamide-Zinc-Copper-FA (NICOTINAMIDE ZCF PO) Take 1 capsule by mouth daily. Nicotinamide Riboside-Pterostilbene 250-50 mg ([employer])   Yes [provider]  OVER THE  COUNTER MEDICATION Place 1 each under the tongue daily. 3-4 DROP DAILY (CBD OIL)   Yes [provider]  prednisoLONE acetate (PRED FORTE) 1 % ophthalmic suspension Place 1 drop into both eyes 4 (four) times daily as needed (for iritis flare ups (inflammation)).   Yes [provider]  Probiotic Product (PROBIOTIC PO) Take 1 capsule by mouth once a week.   Yes [provider]  traMADol-acetaminophen (ULTRACET) 37.5-325 MG tablet Take 0.5-1 tablets by mouth every 6 (six) hours as needed (for pain.).   Yes [provider]  traZODone (DESYREL) 50 MG tablet Take 25-50 mg by mouth at bedtime as needed for sleep.   Yes [provider]  triprolidine-pseudoephedrine (WAL-ACT) 2.5-60 MG TABS tablet Take 1 tablet by mouth every 6 (six) hours as needed for allergies.   Yes [provider]  TURMERIC PO Take 1 capsule by mouth every other day. CURAMIN   Yes [provider]    Physical Exam: Blood pressure (!) 162/90, pulse 78, temperature 98.4 F (36.9 C), temperature source Oral, height 5' 4" (1.626 m), weight 240 lb (108.9 kg), SpO2 96 %.    General: Well developed, well nourished, NAD  HEENT: OP clear, mucus membranes moist  SKIN: warm, dry. No rashes.  Neuro: No focal deficits  Musculoskeletal: Muscle strength 5/5 all ext  Psychiatric: Mood and affect normal  Neck: No JVD, no carotid bruits, no thyromegaly, no lymphadenopathy.  Lungs:Clear bilaterally, no wheezes, rhonci, crackles  Cardiovascular: Regular rate and rhythm. No murmurs, gallops or rubs.  Abdomen:Soft. Bowel sounds present. Non-tender.  Extremities: No lower extremity edema. Pulses are 2 + in the bilateral DP/PT.   Labs:   Lab Results  Component Value Date   WBC 7.4 07/25/2017   HGB 14.6 07/25/2017   HCT 44.1 07/25/2017   MCV 92 07/25/2017   PLT 342 07/25/2017    Recent Labs Lab 07/25/17 1544  NA 140  K 4.7  CL 104  CO2 23  BUN 14  CREATININE 0.77  CALCIUM  9.5  GLUCOSE 126*   Lab Results  Component Value Date   CKTOTAL 81 05/29/2017   CKMB 1.3 05/29/2017   No results found for: CHOL No results found for: HDL No results found for: LDLCALC No results found for: TRIG No results found for: CHOLHDL No results found for: LDLDIRECT    ASSESSMENT AND PLAN:   Abnormal stress test with evidence of CAD on chest CT (noncardiac scan): plan left heart cath with possible PCI today.   Darlina Guys, MD 07/28/2017, 10:17 AM

## 2017-07-29 ENCOUNTER — Encounter (HOSPITAL_COMMUNITY): Payer: Self-pay | Admitting: Physician Assistant

## 2017-07-29 DIAGNOSIS — R0609 Other forms of dyspnea: Secondary | ICD-10-CM

## 2017-07-29 DIAGNOSIS — Z9989 Dependence on other enabling machines and devices: Secondary | ICD-10-CM

## 2017-07-29 DIAGNOSIS — G4733 Obstructive sleep apnea (adult) (pediatric): Secondary | ICD-10-CM

## 2017-07-29 DIAGNOSIS — D45 Polycythemia vera: Secondary | ICD-10-CM | POA: Diagnosis not present

## 2017-07-29 DIAGNOSIS — J449 Chronic obstructive pulmonary disease, unspecified: Secondary | ICD-10-CM | POA: Diagnosis not present

## 2017-07-29 DIAGNOSIS — J849 Interstitial pulmonary disease, unspecified: Secondary | ICD-10-CM | POA: Diagnosis not present

## 2017-07-29 DIAGNOSIS — R9439 Abnormal result of other cardiovascular function study: Secondary | ICD-10-CM | POA: Diagnosis not present

## 2017-07-29 DIAGNOSIS — K5090 Crohn's disease, unspecified, without complications: Secondary | ICD-10-CM | POA: Diagnosis not present

## 2017-07-29 LAB — BASIC METABOLIC PANEL
Anion gap: 5 (ref 5–15)
BUN: 14 mg/dL (ref 6–20)
CO2: 27 mmol/L (ref 22–32)
Calcium: 8.9 mg/dL (ref 8.9–10.3)
Chloride: 108 mmol/L (ref 101–111)
Creatinine, Ser: 0.69 mg/dL (ref 0.44–1.00)
GFR calc Af Amer: >60 mL/min (ref >60)
GFR calc non Af Amer: >60 mL/min (ref >60)
Glucose, Bld: 119 mg/dL — ABNORMAL HIGH (ref 65–99)
Potassium: 3.7 mmol/L (ref 3.5–5.1)
Sodium: 140 mmol/L (ref 135–145)

## 2017-07-29 NOTE — Discharge Instructions (Signed)
Radial Site Care Refer to this sheet in the next few weeks. These instructions provide you with information on caring for yourself after your procedure. Your caregiver may also give you more specific instructions. Your treatment has been planned according to current medical practices, but problems sometimes occur. Call your caregiver if you have any problems or questions after your procedure. HOME CARE INSTRUCTIONS  You may shower the day after the procedure.Remove the bandage (dressing) and gently wash the site with plain soap and water.Gently pat the site dry.   Do not apply powder or lotion to the site.   Do not submerge the affected site in water for 3 to 5 days.   Inspect the site at least twice daily.   Do not flex or bend the affected arm for 24 hours.   No lifting over 5 pounds (2.3 kg) for 5 days after your procedure.   Do not drive home if you are discharged the same day of the procedure. Have someone else drive you.   You may drive 24 hours after the procedure unless otherwise instructed by your caregiver.  What to expect:  Any bruising will usually fade within 1 to 2 weeks.   Blood that collects in the tissue (hematoma) may be painful to the touch. It should usually decrease in size and tenderness within 1 to 2 weeks.  SEEK IMMEDIATE MEDICAL CARE IF:  You have unusual pain at the radial site.   You have redness, warmth, swelling, or pain at the radial site.   You have drainage (other than a small amount of blood on the dressing).   You have chills.   You have a fever or persistent symptoms for more than 72 hours.   You have a fever and your symptoms suddenly get worse.   Your arm becomes pale, cool, tingly, or numb.   You have heavy bleeding from the site. Hold pressure on the site.    Groin Site Care Refer to this sheet in the next few weeks. These instructions provide you with information on caring for yourself after your procedure. Your caregiver may also  give you more specific instructions. Your treatment has been planned according to current medical practices, but problems sometimes occur. Call your caregiver if you have any problems or questions after your procedure. HOME CARE INSTRUCTIONS  You may shower 24 hours after the procedure. Remove the bandage (dressing) and gently wash the site with plain soap and water. Gently pat the site dry.   Do not apply powder or lotion to the site.   Do not sit in a bathtub, swimming pool, or whirlpool for 5 to 7 days.   No bending, squatting, or lifting anything over 10 pounds (4.5 kg) as directed by your caregiver.   Inspect the site at least twice daily.   Do not drive home if you are discharged the same day of the procedure. Have someone else drive you.   You may drive 24 hours after the procedure unless otherwise instructed by your caregiver.  What to expect:  Any bruising will usually fade within 1 to 2 weeks.   Blood that collects in the tissue (hematoma) may be painful to the touch. It should usually decrease in size and tenderness within 1 to 2 weeks.  SEEK IMMEDIATE MEDICAL CARE IF:  You have unusual pain at the groin site or down the affected leg.   You have redness, warmth, swelling, or pain at the groin site.   You have drainage (  other than a small amount of blood on the dressing).   You have chills.   You have a fever or persistent symptoms for more than 72 hours.   You have a fever and your symptoms suddenly get worse.   Your leg becomes pale, cool, tingly, or numb.  You have heavy bleeding from the site. Hold pressure on the site. [name]

## 2017-07-29 NOTE — Discharge Summary (Signed)
Discharge Summary    Patient ID: Patty [name],  MRN: [id number], DOB/AGE: 1951/04/07 66 y.o.  Admit date: 07/28/2017 Discharge date: 07/29/2017  Primary Care Provider: Patient, No Pcp Per Primary Cardiologist: Dr. Johnsie Cancel    Discharge Diagnoses    Principal Problem:   Abnormal stress test Active Problems:   ILD (interstitial lung disease) ([place])   Polycythemia vera ([place])   Crohn disease ([facility])   OSA on CPAP   Allergies Allergies  Allergen Reactions  . Onion Other (See Comments)    sereve diarrhea, stomach pains (Crohn's flares)  . Oxycodone Other (See Comments)    Altered mental status  . Prednisone Other (See Comments)    Altered mood  . Sulfa Drugs Cross Reactors Other (See Comments)    "feels like bugs crawling on me"     History of Present Illness     Patty [name] is a 66 y.o. female with a history of DM, COPD, OSA, ILD, and coronary calcium on CT scan with follow up abnormal stress test who presented to [facility] on [facility]/17 for outpatient cardiac cath.   Hospital Course     Consultants: none   LHC showed no angiographic evidence of CAD, but did show elevated filling pressures. Diuretic therapy may be beneficial in the future if she become symptomatic. Right radial access was initially attempted but Dr. Angelena Form could not advance a wire beyond the elbow due to what appeared to be a radial loop. Later femoral access was used. She was admitted overnight because she didn't have anyone to stay with her after her procedure.   The patient has had an uncomplicated hospital course and is recovering well. The radial & femoral catheter site is stable. She has been seen by Dr. Irish Lack today and deemed ready for discharge home. She will not require long term cardiology follow up. Discharge medications are listed below.  _____________  Discharge Vitals Blood pressure 137/84, pulse 76, temperature 97.9 F (36.6 C), temperature source Oral, resp. rate (!) 24, height _0   (1.626 m), weight 256 lb 6.3 oz (116.3 kg), SpO2 94 %.  Filed Weights   07/28/17 0952 07/29/17 0241  Weight: 240 lb (108.9 kg) 256 lb 6.3 oz (116.3 kg)    Labs & Radiologic Studies     CBC No results for input(s): WBC, NEUTROABS, HGB, HCT, MCV, PLT in the last 72 hours. Basic Metabolic Panel  Recent Labs  07/29/17 0553  NA 140  K 3.7  CL 108  CO2 27  GLUCOSE 119*  BUN 14  CREATININE 0.69  CALCIUM 8.9   Liver Function Tests No results for input(s): AST, ALT, ALKPHOS, BILITOT, PROT, ALBUMIN in the last 72 hours. No results for input(s): LIPASE, AMYLASE in the last 72 hours. Cardiac Enzymes No results for input(s): CKTOTAL, CKMB, CKMBINDEX, TROPONINI in the last 72 hours. BNP Invalid input(s): POCBNP D-Dimer No results for input(s): DDIMER in the last 72 hours. Hemoglobin A1C No results for input(s): HGBA1C in the last 72 hours. Fasting Lipid Panel No results for input(s): CHOL, HDL, LDLCALC, TRIG, CHOLHDL, LDLDIRECT in the last 72 hours. Thyroid Function Tests No results for input(s): TSH, T4TOTAL, T3FREE, THYROIDAB in the last 72 hours.  Invalid input(s): FREET3  No results found.   Diagnostic Studies/Procedures    LHC 07/28/17 Conclusion  1. No angiographic evidence of CAD 2. Elevated filling pressures Recommendations: No further ischemic workup. Her filling pressures are elevated. She may benefit from diuretic therapy if she becomes symptomatic.  _____________    Disposition   Pt is being discharged home today in good condition.  Follow-up Plans & Appointments    Follow-up Information    Primary care provider Follow up.            Discharge Medications     Medication List    TAKE these medications   aspirin EC 81 MG tablet Take 81 mg by mouth daily.   B-complex with vitamin C tablet Take 1 tablet by mouth daily.   Biotin 5000 MCG Subl Place 5,000 mcg under the tongue daily.   Chromium 500 MCG Tabs Take 500 mcg by mouth every  3 (three) days.   clonazePAM 0.5 MG tablet Commonly known as:  KLONOPIN Take 0.125-0.25 mg by mouth daily as needed (for anxiety or sleep.).   cyanocobalamin 1000 MCG/ML injection Commonly known as:  (VITAMIN B-12) Inject 1,000 mcg into the muscle every 30 (thirty) days. Pernicious anemia   Vitamin B-12 5000 MCG Subl Place 5,000 mcg under the tongue daily.   cyclopentolate 2 % ophthalmic solution Commonly known as:  CYCLODRYL,CYCLOGYL Place 1 drop into both eyes 3 (three) times daily as needed (for uveitis or iritis).   fluticasone 50 MCG/ACT nasal spray Commonly known as:  FLONASE Place 1 spray into both nostrils daily as needed for allergies.   FOCUSED MIND PO Take 1 tablet by mouth every 3 (three) days. FOCUS FACTOR NUTRITIONAL SUPPLEMENT   ICAPS AREDS 2 PO Take 1 tablet by mouth every 3 (three) days.   Magnesium 250 MG Tabs Take 250 mg by mouth every other day.   NICOTINAMIDE ZCF PO Take 1 capsule by mouth daily. Nicotinamide Riboside-Pterostilbene 250-50 mg ([employer])   OVER THE COUNTER MEDICATION Place 1 each under the tongue daily. 3-4 DROP DAILY (CBD OIL)   prednisoLONE acetate 1 % ophthalmic suspension Commonly known as:  PRED FORTE Place 1 drop into both eyes 4 (four) times daily as needed (for iritis flare ups (inflammation)).   PROBIOTIC PO Take 1 capsule by mouth once a week.   traMADol-acetaminophen 37.5-325 MG tablet Commonly known as:  ULTRACET Take 0.5-1 tablets by mouth every 6 (six) hours as needed (for pain.).   traZODone 50 MG tablet Commonly known as:  DESYREL Take 25-50 mg by mouth at bedtime as needed for sleep.   TURMERIC PO Take 1 capsule by mouth every other day. CURAMIN   Vitamin D 2000 units tablet Take 2,000 Units by mouth daily.   WAL-ACT 2.5-60 MG Tabs tablet Generic drug:  triprolidine-pseudoephedrine Take 1 tablet by mouth every 6 (six) hours as needed for allergies.   ZYRTEC-D PO Take 0.5-1  tablets by mouth daily as needed (for sinus congestion/allergies.).          Outstanding Labs/Studies   none  Duration of Discharge Encounter   Greater than 30 minutes including physician time.  Signed, Angelena Form PA-C 07/29/2017, 9:22 AM   I have examined the patient and reviewed assessment and plan and discussed with patient.  Agree with above as stated.  PLan for discharge later today.  No CAD.  Continue preventive therapy.  Please see my note from earlier today.  Larae Grooms

## 2017-07-29 NOTE — Progress Notes (Addendum)
Progress Note  Patient Name: Leiana Rund Date of Encounter: 07/29/2017  Primary Cardiologist: Johnsie Cancel  Subjective   No chest pain.  No bleeding problems overnight.   Inpatient Medications    Scheduled Meds: . aspirin EC  81 mg Oral Daily  . sodium chloride flush  3 mL Intravenous Q12H   Continuous Infusions: . sodium chloride     PRN Meds: sodium chloride, acetaminophen, ondansetron (ZOFRAN) IV, sodium chloride flush, traZODone   Vital Signs    Vitals:   07/28/17 2000 07/29/17 0000 07/29/17 0241 07/29/17 0700  BP:   130/63 137/84  Pulse:  72 72 76  Resp: _0 (!) 24  Temp:   (!) 97.5 F (36.4 C) 97.9 F (36.6 C)  TempSrc:   Oral Oral  SpO2:  97% 97% 94%  Weight:   256 lb 6.3 oz (116.3 kg)   Height:        Intake/Output Summary (Last 24 hours) at 07/29/17 0847 Last data filed at 07/29/17 0700  Gross per 24 hour  Intake           434.17 ml  Output                0 ml  Net           434.17 ml   Filed Weights   07/28/17 0952 07/29/17 0241  Weight: 240 lb (108.9 kg) 256 lb 6.3 oz (116.3 kg)    Telemetry    NSR - Personally Reviewed  ECG    NSR, no ST segment changes - Personally Reviewed  Physical Exam   GEN: No acute distress.   Neck: No JVD Cardiac: RRR, no murmurs, rubs, or gallops.  Respiratory: Clear to auscultation bilaterally. GI: Soft, nontender, non-distended  MS: No edema; No deformity.  No radial hematoma.  Right femoral area stable- no hematoma.  3+ right DP pulse Neuro:  Nonfocal  Psych: Normal affect   Labs    Chemistry Recent Labs Lab 07/25/17 1544 07/29/17 0553  NA 140 140  K 4.7 3.7  CL 104 108  CO2 23 27  GLUCOSE 126* 119*  BUN 14 14  CREATININE 0.77 0.69  CALCIUM 9.5 8.9  GFRNONAA 81 >60  GFRAA 93 >60  ANIONGAP  --  5     Hematology Recent Labs Lab 07/25/17 1544  WBC 7.4  RBC 4.81  HGB 14.6  HCT 44.1  MCV 92  MCH 30.4  MCHC 33.1  RDW 14.8  PLT 342    Cardiac EnzymesNo results for input(s):  TROPONINI in the last 168 hours. No results for input(s): TROPIPOC in the last 168 hours.   BNPNo results for input(s): BNP, PROBNP in the last 168 hours.   DDimer No results for input(s): DDIMER in the last 168 hours.   Radiology    No results found.  Cardiac Studies   Cath showed no CAD  Patient Profile     66 y.o. female who had no signficant CAD by cath, but did have a radial loop and thus had to be cathed from the right femoral approach.  Assessment & Plan    1) No CAD by cath.  Did not have anyone tostay with her overnight post cath so stayed in the hospital.  Access sites stable.  Continue preventive therapy.  Mildly increase LVEDP.  Can consider diuretic as outpatient. Discharge today.  2) DOE: Not caused by CAD.   3) False positive stress test.  Signed, Larae Grooms, MD  07/29/2017, 8:47 AM

## 2017-07-31 ENCOUNTER — Encounter (HOSPITAL_COMMUNITY): Payer: Self-pay | Admitting: Cardiovascular Disease

## 2017-08-03 ENCOUNTER — Telehealth: Payer: Self-pay

## 2017-08-03 NOTE — Telephone Encounter (Signed)
Info on assistance program [facility] sent via Mychart

## 2017-08-04 DIAGNOSIS — H353131 Nonexudative age-related macular degeneration, bilateral, early dry stage: Secondary | ICD-10-CM | POA: Diagnosis not present

## 2017-08-04 DIAGNOSIS — H2513 Age-related nuclear cataract, bilateral: Secondary | ICD-10-CM | POA: Diagnosis not present

## 2017-08-15 ENCOUNTER — Ambulatory Visit (INDEPENDENT_AMBULATORY_CARE_PROVIDER_SITE_OTHER): Payer: Medicare Other | Admitting: Internal Medicine

## 2017-08-15 ENCOUNTER — Telehealth: Payer: Self-pay | Admitting: Internal Medicine

## 2017-08-15 ENCOUNTER — Encounter: Payer: Self-pay | Admitting: Cardiovascular Disease

## 2017-08-15 ENCOUNTER — Encounter: Payer: Self-pay | Admitting: Internal Medicine

## 2017-08-15 VITALS — BP 118/72 | HR 76 | Ht 65.0 in | Wt 249.2 lb

## 2017-08-15 DIAGNOSIS — R0602 Shortness of breath: Secondary | ICD-10-CM | POA: Diagnosis not present

## 2017-08-15 DIAGNOSIS — J849 Interstitial pulmonary disease, unspecified: Secondary | ICD-10-CM | POA: Diagnosis not present

## 2017-08-15 DIAGNOSIS — Z9989 Dependence on other enabling machines and devices: Secondary | ICD-10-CM

## 2017-08-15 DIAGNOSIS — G4733 Obstructive sleep apnea (adult) (pediatric): Secondary | ICD-10-CM

## 2017-08-15 DIAGNOSIS — I2510 Atherosclerotic heart disease of native coronary artery without angina pectoris: Secondary | ICD-10-CM

## 2017-08-15 NOTE — Telephone Encounter (Signed)
Order placed. Nothing further needed. 

## 2017-08-15 NOTE — Telephone Encounter (Signed)
Per MR: Ok to order CPAP titration study.   The way the order needs to be placed is on the board in triage.

## 2017-08-15 NOTE — Telephone Encounter (Signed)
Hi [name]  Patty [name] had higher LVEDP on cath. Do you want me to start low dose lasix for her? 62m per day? I am happy to. She has multifactorial dyspnea  Thanks  Dr. MBrand Males M.D., FWest Tennessee Healthcare - Volunteer HospitalC.P Pulmonary and Critical Care Medicine Staff Physician CBallardPulmonary and Critical Care Pager: [phone] If no answer or between  15:00h - 7:00h: call [phone]  08/15/2017 10:48 AM

## 2017-08-15 NOTE — Patient Instructions (Addendum)
ICD-10-CM   1. Shortness of breath R06.02   2. ILD (interstitial lung disease) ([facility]) J84.9     Shortness of breath  - this is what we call multifactorial - Due to weight, physical deconditioning and diastolic dysfunction as evidenced by heart catheterization - refer pulmonary rehabilitation - will start lasix therapy after talking to cardiology - repeat o2 test at night with cpap on - you might benefit from o2 at night with cpap  ILD (interstitial lung disease) ([place]) - cause unknown and you might need biopsy but want to address above issues first due to prediction low risk of progression next 1 year - do HRCT supine and prone in 9 months from mid-may 2018 - in 9 months from mid-may 2018 do - Pre-bd spiro and dlco only. No lung volume or bd response. No post-bd spiro  Followup  After CT and PFT

## 2017-08-15 NOTE — Telephone Encounter (Addendum)
Called and spoke to pt. Informed her of the change from ONO to CPAP titration. Pt verbalized understanding and denied any further questions or concerns at this time.

## 2017-08-15 NOTE — Progress Notes (Signed)
Subjective:     Patient ID: Patty [name] [name], female   DOB: Dec 16, 1951, 66 y.o.   MRN: [id number]  HPI _0  ID: Patty [name] [name], female    DOB: 1951/05/19, 66 y.o.   MRN: [id number]  Chief Complaint  Patient presents with  . Follow-up    dyspnea     Referring provider: No ref. provider found  HPI: 66 year old female former smoker seen for pulmonary consulokay guarded because then maybe they can meet him right now for the registryt 05/09/2017 for progressive dyspnea since 2004.[name] Patient has polycythemia vera followed by hematology and [place] She has Crohn's disease    IOV  05/09/2017  Chief Complaint  Patient presents with  . Pulmonary Consult    Pt referred for SOB with activity x years. Pt states over the last year the DOE has worsened. Pt denies cough and CP/tightness and f/c/s.     66 year old obese female. In 2004 Started noticing insidious onset of shortness of breath. In 2005 more [place] from [place] and probably gain over 60 pounds of weight. She has extensive workup at that time according to her history. She was then diagnosed with polycythemia by Dr. [name] at [facility] She does not recollect any phlebotomies for this. However in the last 4-5 years she's had worsening shortness of breath. Noticeable with exertion particularly in the gym and doing standing exercises but not so much with sitting exercises. Relieved by rest. When she does some yard work she notices some associated wheezing as well.  Walking desaturation test in the office she did desaturate to 88% and did get tachycardic. She says that a chest x-ray done by primary care physicians interstitial pulmonary fibrosis. I personally visualized the chest x-ray done 04/03/2017: There is some interstitial markings but it is obscured by her obesity. I'm not so certain that is definite ILD. There is no lab work in the record. There is no PFT a CT scan available for visualization     05/29/2017 Follow up :  Dyspnea  Patient returns for a two-week follow-up. Patient was seen for pulmonary consult 05/09/2017 for progressive dyspnea since 2004. Patient was set up for a high resolution CT chest that showed a very mild basilar predominant subpleural reticulation and bronchiectasis which could be due to nonspecific interstitial pneumonitis.. CT did have incidental findings of enlarged pulmonary arteries and aortic and coronary calcification. Patient had pulmonary function test on May 23 that showed an FEV1 at 77%, ratio 73, FVC 81, no significant bronchodilator response, total lung capacity 100%, DLCO 52%. Overnight oximetry test did show significant desaturations. We discussed beginning oxygen at bedtime.  Patient denies significant cough. Says that she get short of breath with walking. Has daytime fatigue and low energy..  Interstitial lung disease patient questionnaire was completed as follows Patient has no previous use of Macrodantin, amiodarone, methotrexate. She has been treated with prednisone for her Crohn's in the past. Patient has had extensive travel with brief visits to multiple state and country's.. [place]. Did live in a apartment that she did notice mold for 4 years. She does have Crohn's disease and was on Mesamaline for many years. Patient says she has had a humidifier and hot tub and previous house. She has no indoor birds. She does have a dog. Patient says she was told that she had a blood clot in the past but was never found.   OV 08/15/2017  Chief Complaint  Patient presents with  . Follow-up  Pt still gets occ. SOB. Other than that, pt states that she has been doing good. Denies any cough or CP.     Follow-up multifactorial dyspnea with interstitial lung disease concern.   Regarding interstitial lung disease concern: Mid May 2018 she had high resolution CT chest that showed interstitial lung disease very mild but unclear if possible UIP pattern. Autoimmune test  was negative. Isolated reduction in diffusion capacity 50%. [employer] of chest physicians questionnaire shows previous Crohn's disease and also mold exposure previously. Overall she feels stable  Regarding dyspnea: She underwent echocardiogram in June 2018 showed grade 1 diastolic dysfunction. Had abnormal cardiac stress test July 2018 followed by cardiac cath early August 2018 that showed normal coronary angiogram but elevated left ventricular end-diastolic pressure. Dietary therapy has been recommended but she is unaware of these results.  Overall she is here to discuss these results.  IMPRESSION: 1. Suspect very mild basilar predominant subpleural reticulation and bronchiolectasis, which may be due to nonspecific interstitial pneumonitis. 2. Aortic atherosclerosis (ICD10-170.0). Coronary artery calcification. 3. Enlarged pulmonary arteries, indicative of pulmonary arterial hypertension.   Electronically Signed   By: Lorin Picket M.D.   On: 05/13/2017 07:33   Results for Patty [name], [name] (MRN [id number]) as of 08/15/2017 10:23  Ref. Range 05/17/2017 11:47  FVC-Pre Latest Units: L 2.61  FVC-%Pred-Pre Latest Units: % 84  FEV1-Pre Latest Units: L 1.83  FEV1-%Pred-Pre Latest Units: % 77  Pre FEV1/FVC ratio Latest Units: % 70   Results for Patty [name], Patty [name] [name] (MRN [id number]) as of 08/15/2017 10:23  Ref. Range 05/17/2017 11:47  DLCO cor Latest Units: ml/min/mmHg 12.31  DLCO cor % pred Latest Units: % 50    Results for Patty [name], [name] (MRN [id number]) as of 08/15/2017 10:23  Ref. Range 05/29/2017 13:30  Anit Nuclear Antibody(ANA) Latest Ref Range: NEGATIVE  NEG  Angiotensin-Converting Enzyme Latest Ref Range: 9 - 67 U/L 30  Cyclic Citrullin Peptide Ab Latest Units: Units <16  ds DNA Ab Latest Units: IU/mL 1  Myeloperoxidase Abs Latest Ref Range: <1.0 AI  <1.0  Serine Protease 3 Latest Ref Range: <1.0 AI  <1.0  RA Latex Turbid. Latest Ref Range: <14 IU/mL <14  Ribonucleic  Protein(ENA) Antibody, IgG Latest Ref Range: <1.0 NEG AI  <1.0 NEG  SSA (Ro) (ENA) Antibody, IgG Latest Ref Range: <1.0 NEG AI  <1.0 NEG  SSB (La) (ENA) Antibody, IgG Latest Ref Range: <1.0 NEG AI  <1.0 NEG  Scleroderma (Scl-70) (ENA) Antibody, IgG Latest Ref Range: <1.0 NEG AI  <1.0 NEG  Results for Patty [name], [name] (MRN [id number]) as of 08/15/2017 10:23  Ref. Range 05/29/2017 13:30 07/25/2017 15:44  Hemoglobin Latest Ref Range: 11.1 - 15.9 g/dL  14.6   Results for Patty [name], [name] (MRN [id number]) as of 08/15/2017 10:23  Ref. Range 05/29/2017 13:30 07/25/2017 15:44  Sed Rate Latest Ref Range: 0 - 30 mm/hr 56 (H)   Results for Patty [name], [name] (MRN [id number]) as of 08/15/2017 10:23  Ref. Range 05/29/2017 13:30 07/25/2017 15:44 07/29/2017 05:53  Creatinine Latest Ref Range: 0.44 - 1.00 mg/dL  0.77 0.69      has a past medical history of Abnormal nuclear stress test; Anemia; Crohn disease ([place]); OSA on CPAP; and Polycythemia vera ([place]).   reports that she quit smoking about 8 years ago. Her smoking use included Cigarettes. She has a 45.00 pack-year smoking history. She has never used smokeless tobacco.  Past Surgical History:  Procedure Laterality Date  . COLON RESECTION    . HIP ARTHROPLASTY    .  LEFT HEART CATH AND CORONARY ANGIOGRAPHY N/A 07/28/2017   Procedure: Left Heart Cath and Coronary Angiography;  Surgeon: Burnell Blanks, MD;  Location: [facility];  Service: Cardiovascular;  Laterality: N/A;    Allergies  Allergen Reactions  . Onion Other (See Comments)    sereve diarrhea, stomach pains (Crohn's flares)  . Oxycodone Other (See Comments)    Altered mental status  . Prednisone Other (See Comments)    Altered mood  . Sulfa Drugs Cross Reactors Other (See Comments)    "feels like bugs crawling on me"     There is no immunization history on file for this patient.  Family History  Problem Relation Age of Onset  . Glaucoma Mother   . Diabetes Mellitus II Father   .  COPD Brother   . Heart disease Brother   . Non-Hodgkin's lymphoma Brother   . Diabetes Mellitus II Brother   . Glaucoma Brother      Current Outpatient Prescriptions:  .  aspirin EC 81 MG tablet, Take 81 mg by mouth daily., Disp: , Rfl:  .  B Complex-C (B-COMPLEX WITH VITAMIN C) tablet, Take 1 tablet by mouth daily., Disp: , Rfl:  .  Biotin 5000 MCG SUBL, Place 5,000 mcg under the tongue daily., Disp: , Rfl:  .  Cetirizine-Pseudoephedrine (ZYRTEC-D PO), Take 0.5-1 tablets by mouth daily as needed (for sinus congestion/allergies.). , Disp: , Rfl:  .  Cholecalciferol (VITAMIN D) 2000 units tablet, Take 2,000 Units by mouth daily., Disp: , Rfl:  .  Chromium 500 MCG TABS, Take 500 mcg by mouth every 3 (three) days., Disp: , Rfl:  .  clonazePAM (KLONOPIN) 0.5 MG tablet, Take 0.125-0.25 mg by mouth daily as needed (for anxiety or sleep.)., Disp: , Rfl:  .  cyanocobalamin (,VITAMIN B-12,) 1000 MCG/ML injection, Inject 1,000 mcg into the muscle every 30 (thirty) days. Pernicious anemia, Disp: , Rfl:  .  Cyanocobalamin (VITAMIN B-12) 5000 MCG SUBL, Place 5,000 mcg under the tongue daily., Disp: , Rfl:  .  cyclopentolate (CYCLODRYL,CYCLOGYL) 2 % ophthalmic solution, Place 1 drop into both eyes 3 (three) times daily as needed (for uveitis or iritis)., Disp: , Rfl:  .  fluticasone (FLONASE) 50 MCG/ACT nasal spray, Place 1 spray into both nostrils daily as needed for allergies., Disp: , Rfl:  .  Magnesium 250 MG TABS, Take 250 mg by mouth every other day., Disp: , Rfl:  .  Misc Natural Products (FOCUSED MIND PO), Take 1 tablet by mouth every 3 (three) days. FOCUS FACTOR NUTRITIONAL SUPPLEMENT, Disp: , Rfl:  .  Multiple Vitamins-Minerals (ICAPS AREDS 2 PO), Take 1 tablet by mouth every 3 (three) days. , Disp: , Rfl:  .  Niacinamide-Zinc-Copper-FA (NICOTINAMIDE ZCF PO), Take 1 capsule by mouth daily. Nicotinamide Riboside-Pterostilbene 250-50 mg ([employer]), Disp: , Rfl:  .  OVER  THE COUNTER MEDICATION, Place 1 each under the tongue daily. 3-4 DROP DAILY (CBD OIL), Disp: , Rfl:  .  prednisoLONE acetate (PRED FORTE) 1 % ophthalmic suspension, Place 1 drop into both eyes 4 (four) times daily as needed (for iritis flare ups (inflammation))., Disp: , Rfl:  .  Probiotic Product (PROBIOTIC PO), Take 1 capsule by mouth once a week., Disp: , Rfl:  .  traMADol-acetaminophen (ULTRACET) 37.5-325 MG tablet, Take 0.5-1 tablets by mouth every 6 (six) hours as needed (for pain.)., Disp: , Rfl:  .  traZODone (DESYREL) 50 MG tablet, Take 25-50 mg by mouth at bedtime as  needed for sleep., Disp: , Rfl:  .  triprolidine-pseudoephedrine (WAL-ACT) 2.5-60 MG TABS tablet, Take 1 tablet by mouth every 6 (six) hours as needed for allergies., Disp: , Rfl:  .  TURMERIC PO, Take 1 capsule by mouth every other day. CURAMIN, Disp: , Rfl:     Review of Systems     Objective:   Physical Exam Vitals:   08/15/17 1016  BP: 118/72  Pulse: 76  SpO2: 94%  Weight: 249 lb 3.2 oz (113 kg)  Height: 5' 5" (1.651 m)    Estimated body mass index is 41.47 kg/m as calculated from the following:   Height as of this encounter: 5' 5" (1.651 m).   Weight as of this encounter: 249 lb 3.2 oz (113 kg). Discussion only    Assessment:       ICD-10-CM   1. Shortness of breath R06.02   2. ILD (interstitial lung disease) ([place]) J84.9        Plan:       Shortness of breath  - this is what we call multifactorial - Due to weight, physical deconditioning and diastolic dysfunction as evidenced by heart catheterization - refer pulmonary rehabilitation - will start lasix therapy after talking to cardiology - repeat o2 test at night with cpap on - you might benefit from o2 at night with cpap  ILD (interstitial lung disease) ([place]) - cause unknown and you might need biopsy but want to address above issues first due to prediction low risk of progression next 1 year - do HRCT supine and prone in 9 months from  mid-may 2018 - in 9 months from mid-may 2018 do - Pre-bd spiro and dlco only. No lung volume or bd response. No post-bd spiro  Followup  After CT and PFT    > 50% of this > 25 min visit spent in face to face counseling or coordination of care   Dr. Brand Males, M.D., [facility].C.P Pulmonary and Critical Care Medicine Staff Physician [place] [facility] Pager: [phone], If no answer or between  15:00h - 7:00h: call [phone]  08/15/2017 10:44 AM

## 2017-08-15 NOTE — Telephone Encounter (Signed)
Corene Cornea with [facility] called to let us know that they can't do an ONO to qualify pt for oxygen. Per the pt's insurance she will have to do a CPAP titration study to get oxygen.  MR - please advise if this is okay to order. Thanks.

## 2017-08-16 NOTE — Telephone Encounter (Signed)
That would be fine 

## 2017-08-17 NOTE — Telephone Encounter (Signed)
LMTCB

## 2017-08-17 NOTE — Telephone Encounter (Signed)
Hi Daneil Dan  Let Lamara Brecht know tha Dr Johnsie Cancel is ok with starting her on lasix 89m daily ibn the morning . Please send script for that along with KCL 134m Monday, wed, Friday  Check bmet in 1 week  Ensure ROV per prior note   Dr. MuBrand MalesM.D., F.[facility].P Pulmonary and Critical Care Medicine Staff Physician [place] and [place] Care Pager: [phone] no answer or between  15:00h - 7:00h: call [phone]  08/17/2017 8:39 AM

## 2017-08-18 ENCOUNTER — Encounter (HOSPITAL_BASED_OUTPATIENT_CLINIC_OR_DEPARTMENT_OTHER): Payer: Medicare Other

## 2017-08-24 NOTE — Telephone Encounter (Signed)
lmtcb for pt.  

## 2017-08-29 ENCOUNTER — Telehealth: Payer: Self-pay | Admitting: Internal Medicine

## 2017-08-29 NOTE — Telephone Encounter (Signed)
Pt called back and a duplicate encounter was opened in error.  lmtcb X1 for pt.

## 2017-08-29 NOTE — Telephone Encounter (Signed)
See 8/21 phone note- this is a duplicate encounter.

## 2017-09-04 NOTE — Telephone Encounter (Signed)
Left message for pt to call us back.

## 2017-09-05 NOTE — Telephone Encounter (Signed)
lmtcb for pt.   Pt has been left multiple messages and unable to reach pt. Will forward to MR as FYI.

## 2017-09-06 NOTE — Telephone Encounter (Signed)
Send a certified letter  Dr. Brand Males, M.D., [facility].C.P Pulmonary and Critical Care Medicine Staff Physician [place] [facility] Pager: [phone], If no answer or between  15:00h - 7:00h: call [phone]  09/06/2017 10:39 PM

## 2017-09-07 ENCOUNTER — Telehealth: Payer: Self-pay | Admitting: Internal Medicine

## 2017-09-07 ENCOUNTER — Encounter: Payer: Self-pay | Admitting: Adult Health

## 2017-09-07 MED ORDER — POTASSIUM CHLORIDE ER 10 MEQ PO TBCR: 10.0000 meq | EXTENDED_RELEASE_TABLET | Freq: Every day | ORAL | 0 refills | Status: DC

## 2017-09-07 MED ORDER — FUROSEMIDE 20 MG PO TABS: 10.0000 mg | ORAL_TABLET | Freq: Every day | ORAL | 0 refills | Status: DC

## 2017-09-07 NOTE — Telephone Encounter (Signed)
I received your letter and would like to tell you first of all that I advised your office that I was traveling for a 3 weeks. My phone reception has been horrible while traveling. I have called back to your office twice and advised the receptionist that if you cannot reach me then leave a message in My Chart or email me.   Please use these two options if you can not get a hold of me.  Thank you!    Will forward to MR to make him aware since MR is the last one to see the pt on 8/21

## 2017-09-07 NOTE — Telephone Encounter (Signed)
Spoke with pt, I advised her MR approved the 75m tabs. I sent in both Rx/s Lasix and Potassium to her pharmacy. Pt understood and nothing further is needed.

## 2017-09-07 NOTE — Telephone Encounter (Signed)
Opened in error-tr

## 2017-09-07 NOTE — Telephone Encounter (Signed)
Spoke with pt and advised message from MR. Pt would like to see if she can start on Lasix 57m instead of 217mbecause eshe is sensitive to medication. Please advise. I will wait to call in Rx's until Mr responds.   [phone]

## 2017-09-07 NOTE — Telephone Encounter (Signed)
Letter done and will be mailed certified to pt. Nothing further is needed. I will go downstairs and give it to Terri and to receive confirmation.

## 2017-09-07 NOTE — Telephone Encounter (Signed)
Lasix 88m per day is fine  Dr. MBrand Males M.D., [facility].P Pulmonary and Critical Care Medicine Staff Physician [place]ulmonary and Critical Care Pager: [phone] If no answer or between  15:00h - 7:00h: call [phone]  09/07/2017 1:23 PM

## 2017-09-18 ENCOUNTER — Encounter: Payer: Self-pay | Admitting: Adult Health

## 2017-09-19 ENCOUNTER — Other Ambulatory Visit: Payer: Self-pay

## 2017-09-19 ENCOUNTER — Encounter: Payer: Self-pay | Admitting: Adult Health

## 2017-09-19 MED ORDER — POTASSIUM CHLORIDE ER 10 MEQ PO TBCR: 10.0000 meq | EXTENDED_RELEASE_TABLET | Freq: Every day | ORAL | 0 refills | Status: DC

## 2017-09-19 MED ORDER — FUROSEMIDE 20 MG PO TABS
20.0000 mg | ORAL_TABLET | Freq: Every day | ORAL | 0 refills | Status: DC
Start: 2017-09-19 — End: 2017-11-23

## 2017-09-19 NOTE — Telephone Encounter (Signed)
MR please see pt email. Pt would like to increase lasix to 60m daily. Pt currently taking 183mdaily as prescribed per 08/15/17 phone note. Pt also requesting that k-dur and lasix be sent as 90 day supply. Please advise. Thanks

## 2017-09-19 NOTE — Telephone Encounter (Signed)
Ok to send 90d supply to [facility]  - lasix 24m per day - kcl 127m per day   She would need a fu in 60-90 days iwht app to see progress  Dr. MuBrand MalesM.D., F.[facility].P Pulmonary and Critical Care Medicine Staff Physician [place] and Critical Care Pager: [phone] no answer or between  15:00h - 7:00h: call [phone]  09/19/2017 11:11 AM

## 2017-09-20 NOTE — Telephone Encounter (Signed)
MR please advise on pt's email.

## 2017-09-22 ENCOUNTER — Encounter: Payer: Self-pay | Admitting: Adult Health

## 2017-09-26 ENCOUNTER — Other Ambulatory Visit: Payer: Self-pay | Admitting: Internal Medicine

## 2017-09-26 MED ORDER — POTASSIUM CHLORIDE ER 10 MEQ PO CPCR: 10.0000 meq | ORAL_CAPSULE | Freq: Every day | ORAL | 1 refills | Status: DC

## 2017-09-27 ENCOUNTER — Encounter: Payer: Self-pay | Admitting: Internal Medicine

## 2017-09-27 NOTE — Telephone Encounter (Signed)
MR please advise on below email.  Thanks!   I have been taking Furoemide and Potassium Chloride for 2 weeks now. I am was not absorbing the potassium and I started having high skin sensitivity possibly from sulfa in one of these drugs. Now, for the the past two days my blood pressure has spiked (range 141/80-161/90 - resting was able to get down to 128/80) and I have also had heart palpitations. I have never had high blood pressure (other than maybe road rage) with my normal range being 120-135). My pulse was between 80 and 92. I have stopped taking both of these medications. This is not surprising as I am very sensitive to many medications.     My questions - Should we try different medication? Should I come in to see the Dr? Should I go see Cardiologist?    Thank you,  Patty [name]

## 2017-09-28 NOTE — Telephone Encounter (Signed)
Because the lasix /kcl issues came as a result of our prescribing it (though for cardiolog reasons of stiff heart muscle and approved by cards) bests she comes and sees me or an app to sort out exactly what is going on  Dr. Brand Males, M.D., [facility].C.P Pulmonary and Critical Care Medicine Staff Physician [place] [facility] and Critical Care Pager: [phone], If no answer or between  15:00h - 7:00h: call [phone]  09/28/2017 2:51 PM

## 2017-10-05 ENCOUNTER — Telehealth (HOSPITAL_COMMUNITY): Payer: Self-pay

## 2017-10-05 NOTE — Telephone Encounter (Signed)
Patients orientation needed to be rescheduled. Scheduled orientation to now be on 10/30/2017 @ 12:00pm.

## 2017-10-11 ENCOUNTER — Ambulatory Visit (HOSPITAL_BASED_OUTPATIENT_CLINIC_OR_DEPARTMENT_OTHER): Payer: Medicare Other | Attending: Internal Medicine | Admitting: Pulmonary Disease

## 2017-10-11 VITALS — Ht 64.0 in | Wt 240.0 lb

## 2017-10-11 DIAGNOSIS — Z79899 Other long term (current) drug therapy: Secondary | ICD-10-CM | POA: Diagnosis not present

## 2017-10-11 DIAGNOSIS — Z9989 Dependence on other enabling machines and devices: Secondary | ICD-10-CM | POA: Diagnosis not present

## 2017-10-11 DIAGNOSIS — J849 Interstitial pulmonary disease, unspecified: Secondary | ICD-10-CM | POA: Insufficient documentation

## 2017-10-11 DIAGNOSIS — G4733 Obstructive sleep apnea (adult) (pediatric): Secondary | ICD-10-CM | POA: Diagnosis not present

## 2017-10-12 NOTE — Procedures (Signed)
   Patient Name: Patty [name], Patty [name] Date: 10/11/2017 Gender: Female D.O.B: February 10, 1951 Age (years): 37 Referring Provider: Brand Males Height (inches): 70 Interpreting Physician: Chesley Mires MD, ABSM Weight (lbs): 240 RPSGT: Jorge Ny BMI: 41 MRN: [id number] Neck Size: 17.00  CLINICAL INFORMATION 66 yo female with history of interstitial lung disease had home sleep study which showed severe obstructive sleep apnea with an AHI of 33.8 and SaO2 low of 69% from 06/01/17.  SLEEP STUDY TECHNIQUE As per the AASM Manual for the Scoring of Sleep and Associated Events v2.3 (April 2016) with a hypopnea requiring 4% desaturations.  The channels recorded and monitored were frontal, central and occipital EEG, electrooculogram (EOG), submentalis EMG (chin), nasal and oral airflow, thoracic and abdominal wall motion, anterior tibialis EMG, snore microphone, electrocardiogram, and pulse oximetry. Continuous positive airway pressure (CPAP) was initiated at the beginning of the study and titrated to treat sleep-disordered breathing.  MEDICATIONS Medications self-administered by patient taken the night of the study : ACTIFED  TECHNICIAN COMMENTS Comments added by technician: NONE  Comments added by scorer: N/A RESPIRATORY PARAMETERS Optimal PAP Pressure (cm): 8 AHI at Optimal Pressure (/hr): 0.0 Overall Minimal O2 (%): 89.00 Supine % at Optimal Pressure (%): 100 Minimal O2 at Optimal Pressure (%): 90.0    SLEEP ARCHITECTURE The study was initiated at 11:00:11 PM and ended at 5:32:35 AM.  Sleep onset time was 48.5 minutes and the sleep efficiency was 85.6%. The total sleep time was 336.0 minutes.  The patient spent 2.83% of the night in stage N1 sleep, 74.26% in stage N2 sleep, 19.94% in stage N3 and 2.98% in REM.Stage REM latency was 70.0 minutes  Wake after sleep onset was 7.9. Alpha intrusion was absent. Supine sleep was 100.00%.  CARDIAC DATA The 2 lead EKG demonstrated  sinus rhythm. The mean heart rate was 74.17 beats per minute. Other EKG findings include: None.  LEG MOVEMENT DATA The total Periodic Limb Movements of Sleep (PLMS) were 30. The PLMS index was 5.36. A PLMS index of <15 is considered normal in adults.  IMPRESSIONS - She did well with CPAP 8 cm H2O.  She did not need supplemental oxygen once she as at appropriate CPAP setting.  DIAGNOSIS - Obstructive Sleep Apnea (327.23 [G47.33 ICD-10])  RECOMMENDATIONS - Trial of CPAP therapy on 8 cm H2O with a Smaill size Fisher&Paykel Nasal Pillow Mask Brevida mask and heated humidification.  [Electronically signed] 10/12/2017 09:01 AM  Chesley Mires MD, ABSM Diplomate, American Board of Sleep Medicine   NPI: [id number]

## 2017-10-17 ENCOUNTER — Encounter: Payer: Self-pay | Admitting: Internal Medicine

## 2017-10-17 ENCOUNTER — Ambulatory Visit (INDEPENDENT_AMBULATORY_CARE_PROVIDER_SITE_OTHER): Payer: Medicare Other | Admitting: Internal Medicine

## 2017-10-17 VITALS — BP 118/72 | HR 90 | Ht 64.0 in | Wt 250.6 lb

## 2017-10-17 DIAGNOSIS — R0602 Shortness of breath: Secondary | ICD-10-CM | POA: Diagnosis not present

## 2017-10-17 DIAGNOSIS — J849 Interstitial pulmonary disease, unspecified: Secondary | ICD-10-CM

## 2017-10-17 DIAGNOSIS — I2510 Atherosclerotic heart disease of native coronary artery without angina pectoris: Secondary | ICD-10-CM

## 2017-10-17 NOTE — Addendum Note (Signed)
Addended by: Parke Poisson E on: 10/17/2017 02:51 PM   Modules accepted: Orders

## 2017-10-17 NOTE — Progress Notes (Signed)
Subjective:     Patient ID: Patty [name], female   DOB: 1951-02-25, 66 y.o.   MRN: [id number]  HPI  _0  ID: Patty [name], female    DOB: Mar 26, 1951, 66 y.o.   MRN: [id number]  Chief Complaint  Patient presents with  . Follow-up    dyspnea     Referring provider: No ref. provider found  HPI: 66 year old female former smoker seen for pulmonary consulokay guarded because then maybe they can meet him right now for the registryt 05/09/2017 for progressive dyspnea since 2004.[name] Patient has polycythemia vera followed by hematology and [place] She has Crohn's disease    IOV  05/09/2017  Chief Complaint  Patient presents with  . Pulmonary Consult    Pt referred for SOB with activity x years. Pt states over the last year the DOE has worsened. Pt denies cough and CP/tightness and f/c/s.     66 year old obese female. In 2004 Started noticing insidious onset of shortness of breath. In 2005 more [place] from [place] and probably gain over 60 pounds of weight. She has extensive workup at that time according to her history. She was then diagnosed with polycythemia by Dr. [name] at [facility]. She does not recollect any phlebotomies for this. However in the last 4-5 years she's had worsening shortness of breath. Noticeable with exertion particularly in the gym and doing standing exercises but not so much with sitting exercises. Relieved by rest. When she does some yard work she notices some associated wheezing as well.  Walking desaturation test in the office she did desaturate to 88% and did get tachycardic. She says that a chest x-ray done by primary care physicians interstitial pulmonary fibrosis. I personally visualized the chest x-ray done 04/03/2017: There is some interstitial markings but it is obscured by her obesity. I'm not so certain that is definite ILD. There is no lab work in the record. There is no PFT a CT scan available for visualization     05/29/2017 Follow up :  Dyspnea  Patient returns for a two-week follow-up. Patient was seen for pulmonary consult 05/09/2017 for progressive dyspnea since 2004. Patient was set up for a high resolution CT chest that showed a very mild basilar predominant subpleural reticulation and bronchiectasis which could be due to nonspecific interstitial pneumonitis.. CT did have incidental findings of enlarged pulmonary arteries and aortic and coronary calcification. Patient had pulmonary function test on May 23 that showed an FEV1 at 77%, ratio 73, FVC 81, no significant bronchodilator response, total lung capacity 100%, DLCO 52%. Overnight oximetry test did show significant desaturations. We discussed beginning oxygen at bedtime.  Patient denies significant cough. Says that she get short of breath with walking. Has daytime fatigue and low energy..  Interstitial lung disease patient questionnaire was completed as follows Patient has no previous use of Macrodantin, amiodarone, methotrexate. She has been treated with prednisone for her Crohn's in the past. Patient has had extensive travel with brief visits to multiple state and country's.. [place]. Did live in a apartment that she did notice mold for 4 years. She does have Crohn's disease and was on Mesamaline for many years. Patient says she has had a humidifier and hot tub and previous house. She has no indoor birds. She does have a dog. Patient says she was told that she had a blood clot in the past but was never found.   OV 08/15/2017  Chief Complaint  Patient presents with  . Follow-up  Pt still gets occ. SOB. Other than that, pt states that she has been doing good. Denies any cough or CP.     Follow-up multifactorial dyspnea with interstitial lung disease concern.   Regarding interstitial lung disease concern: Mid May 2018 she had high resolution CT chest that showed interstitial lung disease very mild but unclear if possible UIP pattern. Autoimmune test  was negative. Isolated reduction in diffusion capacity 50%. [employer] of chest physicians questionnaire shows previous Crohn's disease and also mold exposure previously. Overall she feels stable  Regarding dyspnea: She underwent echocardiogram in June 2018 showed grade 1 diastolic dysfunction. Had abnormal cardiac stress test July 2018 followed by cardiac cath early August 2018 that showed normal coronary angiogram but elevated left ventricular end-diastolic pressure. Dietary therapy has been recommended but she is unaware of these results.  Overall she is here to discuss these results.  IMPRESSION: 1. Suspect very mild basilar predominant subpleural reticulation and bronchiolectasis, which may be due to nonspecific interstitial pneumonitis. 2. Aortic atherosclerosis (ICD10-170.0). Coronary artery calcification. 3. Enlarged pulmonary arteries, indicative of pulmonary arterial hypertension.   Electronically Signed   By: Lorin Picket M.D.   On: 05/13/2017 07:33   OV 10/17/2017  Chief Complaint  Patient presents with  . Follow-up    Pt states that she has had good days and bad days since last visit. States that when she was in [place] for a month, breathing was better; still became SOB but not as bad as in [place]. Pt's SOB is mainly on exertion. Denies any cough or CP.    Follow-up dyspnea that is multifactorial due to obesity, physical deconditioning and diastolic dysfunction Follow-up mild interstitial lung disease not otherwise specified  Last visit I started her on Lasix. I was only supposed to see herin 6 months or so. However she's had problems tolerating Lasix and potassium. It fluctuates between palpitations and hypertension. She feels the palpitations might be related to potassium depletion after taking Lasix. But she also tells me that the Lasix does help her dyspnea. At this point in time her condition is to see cardiologist. Off note she did send the email message  asking these questions on 09/28/2017 made a reply that she tells me that she never got the reply.   Results for Patty [name], Patty [name] (MRN [id number]) as of 08/15/2017 10:23  Ref. Range 05/17/2017 11:47  FVC-Pre Latest Units: L 2.61  FVC-%Pred-Pre Latest Units: % 84  FEV1-Pre Latest Units: L 1.83  FEV1-%Pred-Pre Latest Units: % 77  Pre FEV1/FVC ratio Latest Units: % 70   Results for Patty [name], Patty [name] (MRN [id number]) as of 08/15/2017 10:23  Ref. Range 05/17/2017 11:47  DLCO cor Latest Units: ml/min/mmHg 12.31  DLCO cor % pred Latest Units: % 50    Results for Patty [name], Patty [name] (MRN [id number]) as of 08/15/2017 10:23  Ref. Range 05/29/2017 13:30  Anit Nuclear Antibody(ANA) Latest Ref Range: NEGATIVE  NEG  Angiotensin-Converting Enzyme Latest Ref Range: 9 - 67 U/L 30  Cyclic Citrullin Peptide Ab Latest Units: Units <16  ds DNA Ab Latest Units: IU/mL 1  Myeloperoxidase Abs Latest Ref Range: <1.0 AI  <1.0  Serine Protease 3 Latest Ref Range: <1.0 AI  <1.0  RA Latex Turbid. Latest Ref Range: <14 IU/mL <14  Ribonucleic Protein(ENA) Antibody, IgG Latest Ref Range: <1.0 NEG AI  <1.0 NEG  SSA (Ro) (ENA) Antibody, IgG Latest Ref Range: <1.0 NEG AI  <1.0 NEG  SSB (La) (ENA) Antibody, IgG Latest Ref Range: <1.0 NEG AI  <  1.0 NEG  Scleroderma (Scl-70) (ENA) Antibody, IgG Latest Ref Range: <1.0 NEG AI  <1.0 NEG  Results for Patty [name], Patty [name] (MRN [id number]) as of 08/15/2017 10:23  Ref. Range 05/29/2017 13:30 07/25/2017 15:44  Hemoglobin Latest Ref Range: 11.1 - 15.9 g/dL  14.6   Results for Patty [name], Patty [name] (MRN [id number]) as of 08/15/2017 10:23  Ref. Range 05/29/2017 13:30 07/25/2017 15:44  Sed Rate Latest Ref Range: 0 - 30 mm/hr 56 (H)   Results for Patty [name], Patty [name] (MRN [id number]) as of 08/15/2017 10:23  Ref. Range 05/29/2017 13:30 07/25/2017 15:44 07/29/2017 05:53  Creatinine Latest Ref Range: 0.44 - 1.00 mg/dL  0.77 0.69    Review of Systems     Objective:   Physical Exam Vitals:   10/17/17 1425  BP: 118/72   Pulse: 90  SpO2: 90%  Weight: 250 lb 9.6 oz (113.7 kg)  Height: _0  (1.626 m)    Estimated body mass index is 43.02 kg/m as calculated from the following:   Height as of this encounter: _1  (1.626 m).   Weight as of this encounter: 250 lb 9.6 oz (113.7 kg).  Obese Crackles in posteriour lung base     Assessment:       ICD-10-CM   1. Shortness of breath R06.02   2. ILD (interstitial lung disease) ([place]) J84.9        Plan:      Shortness of breath  - this is what we call multifactorial - Due to weight, physical deconditioning and diastolic dysfunction as evidenced by heart catheterization - too bad that lasix/potassium is giving you intolerance; so moving forward I wil lnot prescribe treatment for diastolic dysfunction - Please visit with your cardiologist to get this sorted out   ILD (interstitial lung disease) ([place]) - cause unknown and you might need biopsy but want to address above issues first due to prediction low risk of progression next 1 year - do HRCT supine and prone in 9 months from mid-may 2018 - in 9 months from mid-may 2018 do - Pre-bd spiro and dlco only. No lung volume or bd response. No post-bd spiro  Followup After CT and PFT   (> 50% of this 15 min visit spent in face to face counseling or/and coordination of care)   Dr. Brand Males, M.D., [facility]C.P Pulmonary and Critical Care Medicine Staff Physician [place] [facility] Pager: [phone], If no answer or between  15:00h - 7:00h: call [phone]  10/17/2017 2:47 PM

## 2017-10-17 NOTE — Patient Instructions (Addendum)
ICD-10-CM   1. Shortness of breath R06.02   2. ILD (interstitial lung disease) ([facility]) J84.9     Shortness of breath  - this is what we call multifactorial - Due to weight, physical deconditioning and diastolic dysfunction as evidenced by heart catheterization - too bad that lasix/potassium is giving you intolerance; so moving forward I wil lnot prescribe treatment for diastolic dysfunction - Please visit with your cardiologist to get this sorted out   ILD (interstitial lung disease) ([place]) - cause unknown and you might need biopsy but want to address above issues first due to prediction low risk of progression next 1 year - do HRCT supine and prone in 9 months from mid-may 2018 - in 9 months from mid-may 2018 do - Pre-bd spiro and dlco only. No lung volume or bd response. No post-bd spiro  Followup After CT and PFT

## 2017-10-18 ENCOUNTER — Encounter: Payer: Self-pay | Admitting: Adult Health

## 2017-10-18 ENCOUNTER — Telehealth: Payer: Self-pay

## 2017-10-18 NOTE — Telephone Encounter (Signed)
Raquel Sarna: please bring this to me for signature  Dr. Brand Males, M.D., [facility].C.P Pulmonary and Critical Care Medicine Staff Physician [place] [facility] Pager: [phone], If no answer or between  15:00h - 7:00h: call [phone]  10/18/2017 9:22 AM

## 2017-10-18 NOTE — Telephone Encounter (Signed)
Called pt to let her know we have the plackard ready for pt to pick up. Left her a message to have her call us back.

## 2017-10-18 NOTE — Telephone Encounter (Signed)
MR form printed and placed in your folder.

## 2017-10-18 NOTE — Telephone Encounter (Signed)
I received a Handicap Application today but it was for the hanging Tags.  Attached is a blank application for the Handicap Plates. There is no charge for a Handicap plate and I would think the majority of your patients may want this. Sent blank one for your files.   Would you please complete one for me and mail it so I can get the plate?  Appreciate it!  [name]

## 2017-10-23 DIAGNOSIS — M546 Pain in thoracic spine: Secondary | ICD-10-CM | POA: Diagnosis not present

## 2017-10-23 DIAGNOSIS — S29012A Strain of muscle and tendon of back wall of thorax, initial encounter: Secondary | ICD-10-CM | POA: Diagnosis not present

## 2017-10-30 ENCOUNTER — Encounter (HOSPITAL_COMMUNITY): Payer: Self-pay

## 2017-10-30 ENCOUNTER — Ambulatory Visit (HOSPITAL_COMMUNITY): Payer: Medicare Other

## 2017-10-30 ENCOUNTER — Encounter (HOSPITAL_COMMUNITY)
Admission: RE | Admit: 2017-10-30 | Discharge: 2017-10-30 | Disposition: A | Discharge status: Home / Self Care | Payer: Medicare Other | Source: Ambulatory Visit | Attending: Internal Medicine | Admitting: Internal Medicine

## 2017-10-30 VITALS — BP 129/79 | HR 82

## 2017-10-30 DIAGNOSIS — Z9889 Other specified postprocedural states: Secondary | ICD-10-CM

## 2017-10-30 DIAGNOSIS — J849 Interstitial pulmonary disease, unspecified: Secondary | ICD-10-CM | POA: Diagnosis not present
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
## 2017-11-02 ENCOUNTER — Encounter (HOSPITAL_COMMUNITY)
Admission: RE | Admit: 2017-11-02 | Discharge: 2017-11-02 | Disposition: A | Discharge status: Home / Self Care | Payer: Medicare Other | Source: Ambulatory Visit | Attending: Internal Medicine | Admitting: Internal Medicine

## 2017-11-02 DIAGNOSIS — J849 Interstitial pulmonary disease, unspecified: Secondary | ICD-10-CM | POA: Diagnosis not present

## 2017-11-02 NOTE — Progress Notes (Signed)
Pulmonary Individual Treatment Plan  Patient Details  Name: Patty [name] MRN: [id number] Date of Birth: February 19, 1951 Referring Provider:     Pulmonary Rehab Walk Test from 11/02/2017 in [place]  Referring Provider  Dr.Ramaswamy      Initial Encounter Date:    Pulmonary Rehab Walk Test from 11/02/2017 in [place]  Date  11/02/17  Referring Provider  Dr.Ramaswamy      Visit Diagnosis: Interstitial pulmonary disease ([place])  Patient's Home Medications on Admission:   Current Outpatient Medications:  .  aspirin EC 81 MG tablet, Take 81 mg by mouth daily., Disp: , Rfl:  .  B Complex-C (B-COMPLEX WITH VITAMIN C) tablet, Take 1 tablet by mouth daily., Disp: , Rfl:  .  Biotin 5000 MCG SUBL, Place 5,000 mcg under the tongue daily., Disp: , Rfl:  .  Cetirizine-Pseudoephedrine (ZYRTEC-D PO), Take 0.5-1 tablets by mouth daily as needed (for sinus congestion/allergies.). , Disp: , Rfl:  .  Cholecalciferol (VITAMIN D) 2000 units tablet, Take 2,000 Units by mouth daily., Disp: , Rfl:  .  Chromium 500 MCG TABS, Take 500 mcg by mouth every 3 (three) days., Disp: , Rfl:  .  clonazePAM (KLONOPIN) 0.5 MG tablet, Take 0.125-0.25 mg by mouth daily as needed (for anxiety or sleep.)., Disp: , Rfl:  .  cyanocobalamin (,VITAMIN B-12,) 1000 MCG/ML injection, Inject 1,000 mcg into the muscle every 30 (thirty) days. Pernicious anemia, Disp: , Rfl:  .  Cyanocobalamin (VITAMIN B-12) 5000 MCG SUBL, Place 5,000 mcg under the tongue daily., Disp: , Rfl:  .  cyclopentolate (CYCLODRYL,CYCLOGYL) 2 % ophthalmic solution, Place 1 drop into both eyes 3 (three) times daily as needed (for uveitis or iritis)., Disp: , Rfl:  .  fluticasone (FLONASE) 50 MCG/ACT nasal spray, Place 1 spray into both nostrils daily as needed for allergies., Disp: , Rfl:  .  furosemide (LASIX) 20 MG tablet, Take 1 tablet (20 mg total) by mouth daily. (Patient not taking: Reported on  10/17/2017), Disp: 90 tablet, Rfl: 0 .  Magnesium 250 MG TABS, Take 250 mg by mouth every other day., Disp: , Rfl:  .  Misc Natural Products (FOCUSED MIND PO), Take 1 tablet by mouth every 3 (three) days. FOCUS FACTOR NUTRITIONAL SUPPLEMENT, Disp: , Rfl:  .  Multiple Vitamins-Minerals (ICAPS AREDS 2 PO), Take 1 tablet by mouth every 3 (three) days. , Disp: , Rfl:  .  Niacinamide-Zinc-Copper-FA (NICOTINAMIDE ZCF PO), Take 1 capsule by mouth daily. Nicotinamide Riboside-Pterostilbene 250-50 mg ([employer]), Disp: , Rfl:  .  OVER THE COUNTER MEDICATION, Place 1 each under the tongue daily. 3-4 DROP DAILY (CBD OIL), Disp: , Rfl:  .  potassium chloride (MICRO-K) 10 MEQ CR capsule, Take 1 capsule (10 mEq total) by mouth daily. (Patient not taking: Reported on 10/17/2017), Disp: 90 capsule, Rfl: 1 .  prednisoLONE acetate (PRED FORTE) 1 % ophthalmic suspension, Place 1 drop into both eyes 4 (four) times daily as needed (for iritis flare ups (inflammation))., Disp: , Rfl:  .  Probiotic Product (PROBIOTIC PO), Take 1 capsule by mouth once a week., Disp: , Rfl:  .  traMADol-acetaminophen (ULTRACET) 37.5-325 MG tablet, Take 0.5-1 tablets by mouth every 6 (six) hours as needed (for pain.)., Disp: , Rfl:  .  traZODone (DESYREL) 50 MG tablet, Take 25-50 mg by mouth at bedtime as needed for sleep., Disp: , Rfl:  .  triprolidine-pseudoephedrine (WAL-ACT) 2.5-60 MG TABS tablet, Take 1 tablet  by mouth every 6 (six) hours as needed for allergies., Disp: , Rfl:  .  TURMERIC PO, Take 1 capsule by mouth every other day. CURAMIN, Disp: , Rfl:   Past Medical History: Past Medical History:  Diagnosis Date  . Abnormal nuclear stress test    a. 07/2017: cath with no CAD  . Anemia   . Crohn disease ([place])   . OSA on CPAP   . Polycythemia vera ([place])     Tobacco Use: Social History   Tobacco Use  Smoking Status Former Smoker  . Packs/day: 1.00  . Years: 45.00  . Pack years: 45.00  . Types:  Cigarettes  . Last attempt to quit: 12/26/2008  . Years since quitting: 8.8  Smokeless Tobacco Never Used    Labs: Recent Review Flowsheet Data    There is no flowsheet data to display.      Capillary Blood Glucose: No results found for: GLUCAP   Pulmonary Assessment Scores: Pulmonary Assessment Scores    Row Name 11/02/17 1638         ADL UCSD   ADL Phase  Entry       mMRC Score   mMRC Score  2        Pulmonary Function Assessment: Pulmonary Function Assessment - 10/30/17 1313      Breath   Bilateral Breath Sounds  Clear    Shortness of Breath  Limiting activity;Yes       Exercise Target Goals: Date: 11/02/17  Exercise Program Goal: Individual exercise prescription set with THRR, safety & activity barriers. Participant demonstrates ability to understand and report RPE using BORG scale, to self-measure pulse accurately, and to acknowledge the importance of the exercise prescription.  Exercise Prescription Goal: Starting with aerobic activity 30 plus minutes a day, 3 days per week for initial exercise prescription. Provide home exercise prescription and guidelines that participant acknowledges understanding prior to discharge.  Activity Barriers & Risk Stratification: Activity Barriers & Cardiac Risk Stratification - 10/30/17 1319      Activity Barriers & Cardiac Risk Stratification   Activity Barriers  Right Hip Replacement;Deconditioning;Muscular Weakness;Shortness of Breath       6 Minute Walk: 6 Minute Walk    Row Name 11/02/17 1635         6 Minute Walk   Phase  Initial     Distance  1000 feet     Walk Time  - 5 minutes 20 seconds     # of Rest Breaks  1 40 seconds     MPH  1.89     METS  2.45     RPE  13     Perceived Dyspnea   3     Symptoms  Yes (comment)     Comments  4/10 right hip pain     Resting HR  93 bpm     Resting BP  134/85     Resting Oxygen Saturation   93 %     Exercise Oxygen Saturation  during 6 min walk  83 %     Max  Ex. HR  154 bpm     Max Ex. BP  179/98     2 Minute Post BP  136/83       Interval HR   1 Minute HR  115     2 Minute HR  127     3 Minute HR  143     4 Minute HR  120     5 Minute  HR  138     6 Minute HR  154     2 Minute Post HR  117     Interval Heart Rate?  Yes       Interval Oxygen   Interval Oxygen?  Yes     Baseline Oxygen Saturation %  93 %     1 Minute Oxygen Saturation %  98 %     1 Minute Liters of Oxygen  0 L     2 Minute Oxygen Saturation %  86 %     2 Minute Liters of Oxygen  0 L     3 Minute Oxygen Saturation %  84 %     3 Minute Liters of Oxygen  0 L     4 Minute Oxygen Saturation %  90 % rest break     4 Minute Liters of Oxygen  0 L     5 Minute Oxygen Saturation %  88 %     5 Minute Liters of Oxygen  0 L     6 Minute Oxygen Saturation %  83 %     6 Minute Liters of Oxygen  0 L     2 Minute Post Oxygen Saturation %  96 %     2 Minute Post Liters of Oxygen  0 L        Oxygen Initial Assessment: Oxygen Initial Assessment - 11/02/17 1639      Initial 6 min Walk   Oxygen Used  None      Program Oxygen Prescription   Program Oxygen Prescription  Continuous;E-Tanks    Liters per minute  2    Comments  Patient desaturated to 83% on 6MWT. Has never had oxygen prescribed to her before. Will re-evaluate her first day of rehab to figure out her supplemental oxygen needs       Oxygen Re-Evaluation:   Oxygen Discharge (Final Oxygen Re-Evaluation):   Initial Exercise Prescription: Initial Exercise Prescription - 11/02/17 1600      Date of Initial Exercise RX and Referring Provider   Date  11/02/17    Referring Provider  Dr.Ramaswamy      Oxygen   Oxygen  -- will re-evaluate needs on first day of exercise   will re-evaluate needs on first day of exercise     NuStep   Level  2    SPM  80    Minutes  17    METs  1.5      Arm Ergometer   Level  1    Watts  20    Minutes  17      Track   Laps  8    Minutes  17      Prescription Details    Frequency (times per week)  2    Duration  Progress to 45 minutes of aerobic exercise without signs/symptoms of physical distress      Intensity   THRR 40-80% of Max Heartrate  61-122    Ratings of Perceived Exertion  11-13    Perceived Dyspnea  0-4      Progression   Progression  Continue progressive overload as per policy without signs/symptoms or physical distress.      Resistance Training   Training Prescription  Yes    Weight  blue bands    Reps  10-15       Perform Capillary Blood Glucose checks as needed.  Exercise Prescription Changes:   Exercise Comments:   Exercise Goals  and Review:   Exercise Goals Re-Evaluation :   Discharge Exercise Prescription (Final Exercise Prescription Changes):   Nutrition:  Target Goals: Understanding of nutrition guidelines, daily intake of sodium <1543m, cholesterol <2020m calories 30% from fat and 7% or less from saturated fats, daily to have 5 or more servings of fruits and vegetables.  Biometrics: Pre Biometrics - 10/30/17 1319      Pre Biometrics   Grip Strength  31 kg        Nutrition Therapy Plan and Nutrition Goals:   Nutrition Discharge: Rate Your Plate Scores:   Nutrition Goals Re-Evaluation:   Nutrition Goals Discharge (Final Nutrition Goals Re-Evaluation):   Psychosocial: Target Goals: Acknowledge presence or absence of significant depression and/or stress, maximize coping skills, provide positive support system. Participant is able to verbalize types and ability to use techniques and skills needed for reducing stress and depression.  Initial Review & Psychosocial Screening: Initial Psych Review & Screening - 10/30/17 1325      Initial Review   Current issues with  None Identified      Family Dynamics   Good Support System?  Yes      Barriers   Psychosocial barriers to participate in program  There are no identifiable barriers or psychosocial needs.       Quality of Life  Scores:   PHQ-9: Recent Review Flowsheet Data    [facility] 10/30/2017   Decreased Interest 0   Down, Depressed, Hopeless 1   PHQ - 2 Score 1     Interpretation of Total Score  Total Score Depression Severity:  1-4 = Minimal depression, 5-9 = Mild depression, 10-14 = Moderate depression, 15-19 = Moderately severe depression, 20-27 = Severe depression   Psychosocial Evaluation and Intervention: Psychosocial Evaluation - 10/30/17 1325      Psychosocial Evaluation & Interventions   Interventions  Encouraged to exercise with the program and follow exercise prescription    Continue Psychosocial Services   No Follow up required       Psychosocial Re-Evaluation:   Psychosocial Discharge (Final Psychosocial Re-Evaluation):   Education: Education Goals: Education classes will be provided on a weekly basis, covering required topics. Participant will state understanding/return demonstration of topics presented.  Learning Barriers/Preferences:   Education Topics: Risk Factor Reduction:  -Group instruction that is supported by a PowerPoint presentation. Instructor discusses the definition of a risk factor, different risk factors for pulmonary disease, and how the heart and lungs work together.     Nutrition for Pulmonary Patient:  -Group instruction provided by PowerPoint slides, verbal discussion, and written materials to support subject matter. The instructor gives an explanation and review of healthy diet recommendations, which includes a discussion on weight management, recommendations for fruit and vegetable consumption, as well as protein, fluid, caffeine, fiber, sodium, sugar, and alcohol. Tips for eating when patients are short of breath are discussed.   Pursed Lip Breathing:  -Group instruction that is supported by demonstration and informational handouts. Instructor discusses the benefits of pursed lip and diaphragmatic breathing and detailed demonstration on  how to preform both.     Oxygen Safety:  -Group instruction provided by PowerPoint, verbal discussion, and written material to support subject matter. There is an overview of "What is Oxygen" and "Why do we need it".  Instructor also reviews how to create a safe environment for oxygen use, the importance of using oxygen as prescribed, and the risks of noncompliance. There is a brief discussion on traveling with oxygen and  resources the patient may utilize.   Oxygen Equipment:  -Group instruction provided by [facility] Staff utilizing handouts, written materials, and equipment demonstrations.   Signs and Symptoms:  -Group instruction provided by written material and verbal discussion to support subject matter. Warning signs and symptoms of infection, stroke, and heart attack are reviewed and when to call the physician/911 reinforced. Tips for preventing the spread of infection discussed.   Advanced Directives:  -Group instruction provided by verbal instruction and written material to support subject matter. Instructor reviews Advanced Directive laws and proper instruction for filling out document.   Pulmonary Video:  -Group video education that reviews the importance of medication and oxygen compliance, exercise, good nutrition, pulmonary hygiene, and pursed lip and diaphragmatic breathing for the pulmonary patient.   Exercise for the Pulmonary Patient:  -Group instruction that is supported by a PowerPoint presentation. Instructor discusses benefits of exercise, core components of exercise, frequency, duration, and intensity of an exercise routine, importance of utilizing pulse oximetry during exercise, safety while exercising, and options of places to exercise outside of rehab.     Pulmonary Medications:  -Verbally interactive group education provided by instructor with focus on inhaled medications and proper administration.   Anatomy and Physiology of the Respiratory System and  Intimacy:  -Group instruction provided by PowerPoint, verbal discussion, and written material to support subject matter. Instructor reviews respiratory cycle and anatomical components of the respiratory system and their functions. Instructor also reviews differences in obstructive and restrictive respiratory diseases with examples of each. Intimacy, Sex, and Sexuality differences are reviewed with a discussion on how relationships can change when diagnosed with pulmonary disease. Common sexual concerns are reviewed.   MD DAY -A group question and answer session with a medical doctor that allows participants to ask questions that relate to their pulmonary disease state.   OTHER EDUCATION -Group or individual verbal, written, or video instructions that support the educational goals of the pulmonary rehab program.   Knowledge Questionnaire Score:   Core Components/Risk Factors/Patient Goals at Admission: Personal Goals and Risk Factors at Admission - 10/30/17 1322      Core Components/Risk Factors/Patient Goals on Admission   Improve shortness of breath with ADL's  Yes    Intervention  Provide education, individualized exercise plan and daily activity instruction to help decrease symptoms of SOB with activities of daily living.    Expected Outcomes  Short Term: Achieves a reduction of symptoms when performing activities of daily living.    Develop more efficient breathing techniques such as purse lipped breathing and diaphragmatic breathing; and practicing self-pacing with activity  Yes    Intervention  Provide education, demonstration and support about specific breathing techniuqes utilized for more efficient breathing. Include techniques such as pursed lipped breathing, diaphragmatic breathing and self-pacing activity.    Expected Outcomes  Short Term: Participant will be able to demonstrate and use breathing techniques as needed throughout daily activities.    Increase knowledge of  respiratory medications and ability to use respiratory devices properly   Yes    Intervention  Provide education and demonstration as needed of appropriate use of medications, inhalers, and oxygen therapy.    Expected Outcomes  Short Term: Achieves understanding of medications use. Understands that oxygen is a medication prescribed by physician. Demonstrates appropriate use of inhaler and oxygen therapy.       Core Components/Risk Factors/Patient Goals Review:    Core Components/Risk Factors/Patient Goals at Discharge (Final Review):    ITP Comments:   Comments:

## 2017-11-09 ENCOUNTER — Encounter (HOSPITAL_COMMUNITY): Payer: Medicare Other

## 2017-11-09 ENCOUNTER — Encounter (HOSPITAL_COMMUNITY)
Admission: RE | Admit: 2017-11-09 | Discharge: 2017-11-09 | Disposition: A | Discharge status: Home / Self Care | Payer: Medicare Other | Source: Ambulatory Visit | Attending: Internal Medicine | Admitting: Internal Medicine

## 2017-11-09 DIAGNOSIS — J849 Interstitial pulmonary disease, unspecified: Secondary | ICD-10-CM | POA: Diagnosis not present

## 2017-11-09 NOTE — Progress Notes (Signed)
Pulmonary Individual Treatment Plan  Patient Details  Name: Patty [name] MRN: [id number] Date of Birth: 07-15-1951 Referring Provider:     Pulmonary Rehab Walk Test from 11/02/2017 in [facility]  Referring Provider  Dr.Ramaswamy      Initial Encounter Date:    Pulmonary Rehab Walk Test from 11/02/2017 in [facility]  Date  11/02/17  Referring Provider  Dr.Ramaswamy      Visit Diagnosis: No diagnosis found.  Patient's Home Medications on Admission:   Current Outpatient Medications:  .  aspirin EC 81 MG tablet, Take 81 mg by mouth daily., Disp: , Rfl:  .  B Complex-C (B-COMPLEX WITH VITAMIN C) tablet, Take 1 tablet by mouth daily., Disp: , Rfl:  .  Biotin 5000 MCG SUBL, Place 5,000 mcg under the tongue daily., Disp: , Rfl:  .  Cetirizine-Pseudoephedrine (ZYRTEC-D PO), Take 0.5-1 tablets by mouth daily as needed (for sinus congestion/allergies.). , Disp: , Rfl:  .  Cholecalciferol (VITAMIN D) 2000 units tablet, Take 2,000 Units by mouth daily., Disp: , Rfl:  .  Chromium 500 MCG TABS, Take 500 mcg by mouth every 3 (three) days., Disp: , Rfl:  .  clonazePAM (KLONOPIN) 0.5 MG tablet, Take 0.125-0.25 mg by mouth daily as needed (for anxiety or sleep.)., Disp: , Rfl:  .  cyanocobalamin (,VITAMIN B-12,) 1000 MCG/ML injection, Inject 1,000 mcg into the muscle every 30 (thirty) days. Pernicious anemia, Disp: , Rfl:  .  Cyanocobalamin (VITAMIN B-12) 5000 MCG SUBL, Place 5,000 mcg under the tongue daily., Disp: , Rfl:  .  cyclopentolate (CYCLODRYL,CYCLOGYL) 2 % ophthalmic solution, Place 1 drop into both eyes 3 (three) times daily as needed (for uveitis or iritis)., Disp: , Rfl:  .  fluticasone (FLONASE) 50 MCG/ACT nasal spray, Place 1 spray into both nostrils daily as needed for allergies., Disp: , Rfl:  .  furosemide (LASIX) 20 MG tablet, Take 1 tablet (20 mg total) by mouth daily. (Patient not taking: Reported on 10/17/2017), Disp:  90 tablet, Rfl: 0 .  Magnesium 250 MG TABS, Take 250 mg by mouth every other day., Disp: , Rfl:  .  Misc Natural Products (FOCUSED MIND PO), Take 1 tablet by mouth every 3 (three) days. FOCUS FACTOR NUTRITIONAL SUPPLEMENT, Disp: , Rfl:  .  Multiple Vitamins-Minerals (ICAPS AREDS 2 PO), Take 1 tablet by mouth every 3 (three) days. , Disp: , Rfl:  .  Niacinamide-Zinc-Copper-FA (NICOTINAMIDE ZCF PO), Take 1 capsule by mouth daily. Nicotinamide Riboside-Pterostilbene 250-50 mg ([employer]), Disp: , Rfl:  .  OVER THE COUNTER MEDICATION, Place 1 each under the tongue daily. 3-4 DROP DAILY (CBD OIL), Disp: , Rfl:  .  potassium chloride (MICRO-K) 10 MEQ CR capsule, Take 1 capsule (10 mEq total) by mouth daily. (Patient not taking: Reported on 10/17/2017), Disp: 90 capsule, Rfl: 1 .  prednisoLONE acetate (PRED FORTE) 1 % ophthalmic suspension, Place 1 drop into both eyes 4 (four) times daily as needed (for iritis flare ups (inflammation))., Disp: , Rfl:  .  Probiotic Product (PROBIOTIC PO), Take 1 capsule by mouth once a week., Disp: , Rfl:  .  traMADol-acetaminophen (ULTRACET) 37.5-325 MG tablet, Take 0.5-1 tablets by mouth every 6 (six) hours as needed (for pain.)., Disp: , Rfl:  .  traZODone (DESYREL) 50 MG tablet, Take 25-50 mg by mouth at bedtime as needed for sleep., Disp: , Rfl:  .  triprolidine-pseudoephedrine (WAL-ACT) 2.5-60 MG TABS tablet, Take 1 tablet by  mouth every 6 (six) hours as needed for allergies., Disp: , Rfl:  .  TURMERIC PO, Take 1 capsule by mouth every other day. CURAMIN, Disp: , Rfl:   Past Medical History: Past Medical History:  Diagnosis Date  . Abnormal nuclear stress test    a. 07/2017: cath with no CAD  . Anemia   . Crohn disease ([place])   . OSA on CPAP   . Polycythemia vera ([place])     Tobacco Use: Social History   Tobacco Use  Smoking Status Former Smoker  . Packs/day: 1.00  . Years: 45.00  . Pack years: 45.00  . Types: Cigarettes  . Last  attempt to quit: 12/26/2008  . Years since quitting: 8.8  Smokeless Tobacco Never Used    Labs: Recent Review Flowsheet Data    There is no flowsheet data to display.      Capillary Blood Glucose: No results found for: GLUCAP   Pulmonary Assessment Scores: Pulmonary Assessment Scores    Row Name 11/02/17 1638 11/09/17 1424       ADL UCSD   ADL Phase  Entry  Entry    SOB Score total  -  22      CAT Score   CAT Score  -  7 Entry      mMRC Score   mMRC Score  2  -       Pulmonary Function Assessment: Pulmonary Function Assessment - 10/30/17 1313      Breath   Bilateral Breath Sounds  Clear    Shortness of Breath  Limiting activity;Yes       Exercise Target Goals:    Exercise Program Goal: Individual exercise prescription set with THRR, safety & activity barriers. Participant demonstrates ability to understand and report RPE using BORG scale, to self-measure pulse accurately, and to acknowledge the importance of the exercise prescription.  Exercise Prescription Goal: Starting with aerobic activity 30 plus minutes a day, 3 days per week for initial exercise prescription. Provide home exercise prescription and guidelines that participant acknowledges understanding prior to discharge.  Activity Barriers & Risk Stratification: Activity Barriers & Cardiac Risk Stratification - 10/30/17 1319      Activity Barriers & Cardiac Risk Stratification   Activity Barriers  Right Hip Replacement;Deconditioning;Muscular Weakness;Shortness of Breath       6 Minute Walk: 6 Minute Walk    Row Name 11/02/17 1635         6 Minute Walk   Phase  Initial     Distance  1000 feet     Walk Time  - 5 minutes 20 seconds     # of Rest Breaks  1 40 seconds     MPH  1.89     METS  2.45     RPE  13     Perceived Dyspnea   3     Symptoms  Yes (comment)     Comments  4/10 right hip pain     Resting HR  93 bpm     Resting BP  134/85     Resting Oxygen Saturation   93 %     Exercise  Oxygen Saturation  during 6 min walk  83 %     Max Ex. HR  154 bpm     Max Ex. BP  179/98     2 Minute Post BP  136/83       Interval HR   1 Minute HR  115     2 Minute  HR  127     3 Minute HR  143     4 Minute HR  120     5 Minute HR  138     6 Minute HR  154     2 Minute Post HR  117     Interval Heart Rate?  Yes       Interval Oxygen   Interval Oxygen?  Yes     Baseline Oxygen Saturation %  93 %     1 Minute Oxygen Saturation %  98 %     1 Minute Liters of Oxygen  0 L     2 Minute Oxygen Saturation %  86 %     2 Minute Liters of Oxygen  0 L     3 Minute Oxygen Saturation %  84 %     3 Minute Liters of Oxygen  0 L     4 Minute Oxygen Saturation %  90 % rest break     4 Minute Liters of Oxygen  0 L     5 Minute Oxygen Saturation %  88 %     5 Minute Liters of Oxygen  0 L     6 Minute Oxygen Saturation %  83 %     6 Minute Liters of Oxygen  0 L     2 Minute Post Oxygen Saturation %  96 %     2 Minute Post Liters of Oxygen  0 L        Oxygen Initial Assessment: Oxygen Initial Assessment - 11/02/17 1639      Initial 6 min Walk   Oxygen Used  None      Program Oxygen Prescription   Program Oxygen Prescription  Continuous;E-Tanks    Liters per minute  2    Comments  Patient desaturated to 83% on 6MWT. Has never had oxygen prescribed to her before. Will re-evaluate her first day of rehab to figure out her supplemental oxygen needs       Oxygen Re-Evaluation: Oxygen Re-Evaluation    Row Name 11/09/17 1611             Program Oxygen Prescription   Program Oxygen Prescription  Continuous;E-Tanks       Liters per minute  2       Comments  patient desaturated to 84% while walking track and was placed on 2 liters of O2 for exercise.         Goals/Expected Outcomes   Short Term Goals  To learn and exhibit compliance with exercise, home and travel O2 prescription;To learn and understand importance of monitoring SPO2 with pulse oximeter and demonstrate accurate use of  the pulse oximeter.;To learn and understand importance of maintaining oxygen saturations>88%;To learn and demonstrate proper pursed lip breathing techniques or other breathing techniques.;To learn and demonstrate proper use of respiratory medications       Long  Term Goals  Exhibits compliance with exercise, home and travel O2 prescription;Verbalizes importance of monitoring SPO2 with pulse oximeter and return demonstration;Maintenance of O2 saturations>88%;Exhibits proper breathing techniques, such as pursed lip breathing or other method taught during program session;Compliance with respiratory medication;Demonstrates proper use of MDI's          Oxygen Discharge (Final Oxygen Re-Evaluation): Oxygen Re-Evaluation - 11/09/17 1611      Program Oxygen Prescription   Program Oxygen Prescription  Continuous;E-Tanks    Liters per minute  2    Comments  patient desaturated to 84% while walking  track and was placed on 2 liters of O2 for exercise.      Goals/Expected Outcomes   Short Term Goals  To learn and exhibit compliance with exercise, home and travel O2 prescription;To learn and understand importance of monitoring SPO2 with pulse oximeter and demonstrate accurate use of the pulse oximeter.;To learn and understand importance of maintaining oxygen saturations>88%;To learn and demonstrate proper pursed lip breathing techniques or other breathing techniques.;To learn and demonstrate proper use of respiratory medications    Long  Term Goals  Exhibits compliance with exercise, home and travel O2 prescription;Verbalizes importance of monitoring SPO2 with pulse oximeter and return demonstration;Maintenance of O2 saturations>88%;Exhibits proper breathing techniques, such as pursed lip breathing or other method taught during program session;Compliance with respiratory medication;Demonstrates proper use of MDI's       Initial Exercise Prescription: Initial Exercise Prescription - 11/02/17 1600       Date of Initial Exercise RX and Referring Provider   Date  11/02/17    Referring Provider  Dr.Ramaswamy      Oxygen   Oxygen  -- will re-evaluate needs on first day of exercise      NuStep   Level  2    SPM  80    Minutes  17    METs  1.5      Arm Ergometer   Level  1    Watts  20    Minutes  17      Track   Laps  8    Minutes  17      Prescription Details   Frequency (times per week)  2    Duration  Progress to 45 minutes of aerobic exercise without signs/symptoms of physical distress      Intensity   THRR 40-80% of Max Heartrate  61-122    Ratings of Perceived Exertion  11-13    Perceived Dyspnea  0-4      Progression   Progression  Continue progressive overload as per policy without signs/symptoms or physical distress.      Resistance Training   Training Prescription  Yes    Weight  blue bands    Reps  10-15       Perform Capillary Blood Glucose checks as needed.  Exercise Prescription Changes: Exercise Prescription Changes    Row Name 11/09/17 1200             Response to Exercise   Blood Pressure (Admit)  134/80       Blood Pressure (Exercise)  134/92       Blood Pressure (Exit)  100/70       Heart Rate (Admit)  107 bpm       Heart Rate (Exercise)  130 bpm       Heart Rate (Exit)  104 bpm       Oxygen Saturation (Admit)  94 %       Oxygen Saturation (Exercise)  89 %       Oxygen Saturation (Exit)  84 %       Rating of Perceived Exertion (Exercise)  95       Perceived Dyspnea (Exercise)  10       Symptoms  1       Duration  Progress to 45 minutes of aerobic exercise without signs/symptoms of physical distress       Intensity  Other (comment) 40-80 HRR         Progression   Progression  Continue to progress workloads to  maintain intensity without signs/symptoms of physical distress.         Resistance Training   Training Prescription  Yes       Weight  blue bands       Reps  10-15         Oxygen   Oxygen  Continuous       Liters  2          NuStep   Level  2       Minutes  17       METs  2         Track   Laps  5       Minutes  17          Exercise Comments:   Exercise Goals and Review:   Exercise Goals Re-Evaluation : Exercise Goals Re-Evaluation    Row Name 11/09/17 1544             Exercise Goal Re-Evaluation   Exercise Goals Review  Increase Physical Activity;Increase Strength and Stamina;Able to understand and use Dyspnea scale;Able to understand and use rate of perceived exertion (RPE) scale;Knowledge and understanding of Target Heart Rate Range (THRR);Understanding of Exercise Prescription       Comments  Patient has only attended one rehab sessions. Will cont. to monitor and progress as able.        Expected Outcomes  Through exercise at rehab and at home, patient will increase strength and stamina making ADL's easier to perform. Patient will also have a better understanding of safe exercise and what they are capable of doing outside of clinical supervision.           Discharge Exercise Prescription (Final Exercise Prescription Changes): Exercise Prescription Changes - 11/09/17 1200      Response to Exercise   Blood Pressure (Admit)  134/80    Blood Pressure (Exercise)  134/92    Blood Pressure (Exit)  100/70    Heart Rate (Admit)  107 bpm    Heart Rate (Exercise)  130 bpm    Heart Rate (Exit)  104 bpm    Oxygen Saturation (Admit)  94 %    Oxygen Saturation (Exercise)  89 %    Oxygen Saturation (Exit)  84 %    Rating of Perceived Exertion (Exercise)  95    Perceived Dyspnea (Exercise)  10    Symptoms  1    Duration  Progress to 45 minutes of aerobic exercise without signs/symptoms of physical distress    Intensity  Other (comment) 40-80 HRR      Progression   Progression  Continue to progress workloads to maintain intensity without signs/symptoms of physical distress.      Resistance Training   Training Prescription  Yes    Weight  blue bands    Reps  10-15      Oxygen   Oxygen   Continuous    Liters  2      NuStep   Level  2    Minutes  17    METs  2      Track   Laps  5    Minutes  17       Nutrition:  Target Goals: Understanding of nutrition guidelines, daily intake of sodium <1567m, cholesterol <2070m calories 30% from fat and 7% or less from saturated fats, daily to have 5 or more servings of fruits and vegetables.  Biometrics: Pre Biometrics - 10/30/17 1319      Pre  Biometrics   Grip Strength  31 kg        Nutrition Therapy Plan and Nutrition Goals:   Nutrition Discharge: Rate Your Plate Scores:   Nutrition Goals Re-Evaluation:   Nutrition Goals Discharge (Final Nutrition Goals Re-Evaluation):   Psychosocial: Target Goals: Acknowledge presence or absence of significant depression and/or stress, maximize coping skills, provide positive support system. Participant is able to verbalize types and ability to use techniques and skills needed for reducing stress and depression.  Initial Review & Psychosocial Screening: Initial Psych Review & Screening - 10/30/17 1325      Initial Review   Current issues with  None Identified      Family Dynamics   Good Support System?  Yes      Barriers   Psychosocial barriers to participate in program  There are no identifiable barriers or psychosocial needs.       Quality of Life Scores:   PHQ-9: Recent Review Flowsheet Data    Depression screen [facility] 2/9 10/30/2017   Decreased Interest 0   Down, Depressed, Hopeless 1   PHQ - 2 Score 1     Interpretation of Total Score  Total Score Depression Severity:  1-4 = Minimal depression, 5-9 = Mild depression, 10-14 = Moderate depression, 15-19 = Moderately severe depression, 20-27 = Severe depression   Psychosocial Evaluation and Intervention: Psychosocial Evaluation - 10/30/17 1325      Psychosocial Evaluation & Interventions   Interventions  Encouraged to exercise with the program and follow exercise prescription    Continue Psychosocial  Services   No Follow up required       Psychosocial Re-Evaluation: Psychosocial Re-Evaluation    [place] Name 11/09/17 1615             Psychosocial Re-Evaluation   Current issues with  None Identified       Expected Outcomes  patient will remain free from psychosocial barriers to participation in pulmonary rehab       Continue Psychosocial Services   No Follow up required          Psychosocial Discharge (Final Psychosocial Re-Evaluation): Psychosocial Re-Evaluation - 11/09/17 1615      Psychosocial Re-Evaluation   Current issues with  None Identified    Expected Outcomes  patient will remain free from psychosocial barriers to participation in pulmonary rehab    Continue Psychosocial Services   No Follow up required       Education: Education Goals: Education classes will be provided on a weekly basis, covering required topics. Participant will state understanding/return demonstration of topics presented.  Learning Barriers/Preferences:   Education Topics: Risk Factor Reduction:  -Group instruction that is supported by a PowerPoint presentation. Instructor discusses the definition of a risk factor, different risk factors for pulmonary disease, and how the heart and lungs work together.     Nutrition for Pulmonary Patient:  -Group instruction provided by PowerPoint slides, verbal discussion, and written materials to support subject matter. The instructor gives an explanation and review of healthy diet recommendations, which includes a discussion on weight management, recommendations for fruit and vegetable consumption, as well as protein, fluid, caffeine, fiber, sodium, sugar, and alcohol. Tips for eating when patients are short of breath are discussed.   Pursed Lip Breathing:  -Group instruction that is supported by demonstration and informational handouts. Instructor discusses the benefits of pursed lip and diaphragmatic breathing and detailed demonstration on how to preform  both.     Oxygen Safety:  -Group instruction provided by  PowerPoint, verbal discussion, and written material to support subject matter. There is an overview of "What is Oxygen" and "Why do we need it".  Instructor also reviews how to create a safe environment for oxygen use, the importance of using oxygen as prescribed, and the risks of noncompliance. There is a brief discussion on traveling with oxygen and resources the patient may utilize.   Oxygen Equipment:  -Group instruction provided by [facility] Staff utilizing handouts, written materials, and equipment demonstrations.   Signs and Symptoms:  -Group instruction provided by written material and verbal discussion to support subject matter. Warning signs and symptoms of infection, stroke, and heart attack are reviewed and when to call the physician/911 reinforced. Tips for preventing the spread of infection discussed.   Advanced Directives:  -Group instruction provided by verbal instruction and written material to support subject matter. Instructor reviews Advanced Directive laws and proper instruction for filling out document.   Pulmonary Video:  -Group video education that reviews the importance of medication and oxygen compliance, exercise, good nutrition, pulmonary hygiene, and pursed lip and diaphragmatic breathing for the pulmonary patient.   Exercise for the Pulmonary Patient:  -Group instruction that is supported by a PowerPoint presentation. Instructor discusses benefits of exercise, core components of exercise, frequency, duration, and intensity of an exercise routine, importance of utilizing pulse oximetry during exercise, safety while exercising, and options of places to exercise outside of rehab.     Pulmonary Medications:  -Verbally interactive group education provided by instructor with focus on inhaled medications and proper administration.   Anatomy and Physiology of the Respiratory System and Intimacy:  -Group  instruction provided by PowerPoint, verbal discussion, and written material to support subject matter. Instructor reviews respiratory cycle and anatomical components of the respiratory system and their functions. Instructor also reviews differences in obstructive and restrictive respiratory diseases with examples of each. Intimacy, Sex, and Sexuality differences are reviewed with a discussion on how relationships can change when diagnosed with pulmonary disease. Common sexual concerns are reviewed.   MD DAY -A group question and answer session with a medical doctor that allows participants to ask questions that relate to their pulmonary disease state.   OTHER EDUCATION -Group or individual verbal, written, or video instructions that support the educational goals of the pulmonary rehab program.   Knowledge Questionnaire Score: Knowledge Questionnaire Score - 11/09/17 1423      Knowledge Questionnaire Score   Pre Score  15/18       Core Components/Risk Factors/Patient Goals at Admission: Personal Goals and Risk Factors at Admission - 10/30/17 1322      Core Components/Risk Factors/Patient Goals on Admission   Improve shortness of breath with ADL's  Yes    Intervention  Provide education, individualized exercise plan and daily activity instruction to help decrease symptoms of SOB with activities of daily living.    Expected Outcomes  Short Term: Achieves a reduction of symptoms when performing activities of daily living.    Develop more efficient breathing techniques such as purse lipped breathing and diaphragmatic breathing; and practicing self-pacing with activity  Yes    Intervention  Provide education, demonstration and support about specific breathing techniuqes utilized for more efficient breathing. Include techniques such as pursed lipped breathing, diaphragmatic breathing and self-pacing activity.    Expected Outcomes  Short Term: Participant will be able to demonstrate and use  breathing techniques as needed throughout daily activities.    Increase knowledge of respiratory medications and ability to use respiratory devices properly  Yes    Intervention  Provide education and demonstration as needed of appropriate use of medications, inhalers, and oxygen therapy.    Expected Outcomes  Short Term: Achieves understanding of medications use. Understands that oxygen is a medication prescribed by physician. Demonstrates appropriate use of inhaler and oxygen therapy.       Core Components/Risk Factors/Patient Goals Review:  Goals and Risk Factor Review    Row Name 11/09/17 1614             Core Components/Risk Factors/Patient Goals Review   Personal Goals Review  Improve shortness of breath with ADL's;Develop more efficient breathing techniques such as purse lipped breathing and diaphragmatic breathing and practicing self-pacing with activity.;Increase knowledge of respiratory medications and ability to use respiratory devices properly.       Review  patient has only attended 1 exercise session since admission and too soon to evaluate progress towards goals          Core Components/Risk Factors/Patient Goals at Discharge (Final Review):  Goals and Risk Factor Review - 11/09/17 1614      Core Components/Risk Factors/Patient Goals Review   Personal Goals Review  Improve shortness of breath with ADL's;Develop more efficient breathing techniques such as purse lipped breathing and diaphragmatic breathing and practicing self-pacing with activity.;Increase knowledge of respiratory medications and ability to use respiratory devices properly.    Review  patient has only attended 1 exercise session since admission and too soon to evaluate progress towards goals       ITP Comments:   Comments: ITP REVIEW Unable to evaluate expected progress toward pulmonary rehab goals after completing 1 sessions. Recommend continued exercise, life style modification, education, and  utilization of breathing techniques to increase stamina and strength and decrease shortness of breath with exertion.

## 2017-11-09 NOTE — Progress Notes (Signed)
Daily Session Note  Patient Details  Name: Patty [name] MRN: [id number] Date of Birth: January 30, 1951 Referring Provider:     Pulmonary Rehab Walk Test from 11/02/2017 in [place]  Referring Provider  Dr.Ramaswamy      Encounter Date: 11/09/2017  Check In:   Capillary Blood Glucose: No results found for this or any previous visit (from the past 24 hour(s)).  Exercise Prescription Changes - 11/09/17 1200      Response to Exercise   Blood Pressure (Admit)  134/80    Blood Pressure (Exercise)  134/92    Blood Pressure (Exit)  100/70    Heart Rate (Admit)  107 bpm    Heart Rate (Exercise)  130 bpm    Heart Rate (Exit)  104 bpm    Oxygen Saturation (Admit)  94 %    Oxygen Saturation (Exercise)  89 %    Oxygen Saturation (Exit)  84 %    Rating of Perceived Exertion (Exercise)  95    Perceived Dyspnea (Exercise)  10    Symptoms  1    Duration  Progress to 45 minutes of aerobic exercise without signs/symptoms of physical distress    Intensity  Other (comment) 40-80 HRR      Progression   Progression  Continue to progress workloads to maintain intensity without signs/symptoms of physical distress.      Resistance Training   Training Prescription  Yes    Weight  blue bands    Reps  10-15      Oxygen   Oxygen  Continuous    Liters  2      NuStep   Level  2    Minutes  17    METs  2      Track   Laps  5    Minutes  17       Social History   Tobacco Use  Smoking Status Former Smoker  . Packs/day: 1.00  . Years: 45.00  . Pack years: 45.00  . Types: Cigarettes  . Last attempt to quit: 12/26/2008  . Years since quitting: 8.8  Smokeless Tobacco Never Used    Goals Met:  Exercise tolerated well Queuing for purse lip breathing No report of cardiac concerns or symptoms Strength training completed today  Goals Unmet:  O2 Sat, placed on 2 lpm for saturation of 84 with ambulation  Comments: Service time is from 1030 to  1225   Dr. Rush Farmer is Medical Director for Pulmonary Rehab at [facility].

## 2017-11-14 ENCOUNTER — Encounter (HOSPITAL_COMMUNITY)
Admission: RE | Admit: 2017-11-14 | Discharge: 2017-11-14 | Disposition: A | Discharge status: Home / Self Care | Payer: Medicare Other | Source: Ambulatory Visit | Attending: Internal Medicine | Admitting: Internal Medicine

## 2017-11-14 VITALS — Wt 254.0 lb

## 2017-11-14 DIAGNOSIS — J849 Interstitial pulmonary disease, unspecified: Secondary | ICD-10-CM

## 2017-11-14 DIAGNOSIS — Z9889 Other specified postprocedural states: Secondary | ICD-10-CM

## 2017-11-14 NOTE — Progress Notes (Signed)
Daily Session Note  Patient Details  Name: Patty [name] MRN: [id number] Date of Birth: 05-27-1951 Referring Provider:     Pulmonary Rehab Walk Test from 11/02/2017 in [place]  Referring Provider  Dr.Ramaswamy      Encounter Date: 11/14/2017  Check In: Session Check In - 11/14/17 1019      Check-In   Location  [facility]    Staff Present  Su Hilt, MS, ACSM RCEP, Exercise Physiologist;Joan Leonia Reeves, RN, BSN;Jatoya Armbrister, RN;Portia Rollene Rotunda, RN, BSN    Supervising physician immediately available to respond to emergencies  [facility] immediately available    Physician(s)  Dr. Maylene Roes    Medication changes reported      No    Fall or balance concerns reported     No    Tobacco Cessation  No Change    Warm-up and Cool-down  Performed as group-led instruction    Resistance Training Performed  Yes    VAD Patient?  No      Pain Assessment   Currently in Pain?  No/denies    Multiple Pain Sites  No       Capillary Blood Glucose: No results found for this or any previous visit (from the past 24 hour(s)).  Exercise Prescription Changes - 11/14/17 1200      Response to Exercise   Blood Pressure (Admit)  122/82    Blood Pressure (Exercise)  130/80    Blood Pressure (Exit)  110/70    Heart Rate (Admit)  88 bpm    Heart Rate (Exercise)  131 bpm    Heart Rate (Exit)  100 bpm    Oxygen Saturation (Admit)  94 %    Oxygen Saturation (Exercise)  87 % sat dropped while walking. O2 increased to 3L sat 91%    Oxygen Saturation (Exit)  97 %    Rating of Perceived Exertion (Exercise)  15    Perceived Dyspnea (Exercise)  3    Duration  Progress to 45 minutes of aerobic exercise without signs/symptoms of physical distress    Intensity  THRR unchanged      Progression   Progression  Continue to progress workloads to maintain intensity without signs/symptoms of physical distress.      Resistance Training   Training Prescription   Yes    Weight  blue bands    Reps  10-15    Time  10 Minutes      Interval Training   Interval Training  --      Oxygen   Oxygen  Continuous    Liters  2-3      NuStep   Level  2    Minutes  17    METs  2      Arm Ergometer   Level  2    Minutes  17      Track   Laps  8    Minutes  17       Social History   Tobacco Use  Smoking Status Former Smoker  . Packs/day: 1.00  . Years: 45.00  . Pack years: 45.00  . Types: Cigarettes  . Last attempt to quit: 12/26/2008  . Years since quitting: 8.8  Smokeless Tobacco Never Used    Goals Met:  Exercise tolerated well No report of cardiac concerns or symptoms Strength training completed today  Goals Unmet:  O2 Sat . Sat dropped to 87% while walking O2 increased to 3L sat improved to 91%.  Comments: Service time is from 1030 to 1210   Dr. Rush Farmer is Medical Director for Pulmonary Rehab at [facility].

## 2017-11-21 ENCOUNTER — Encounter (HOSPITAL_COMMUNITY)
Admission: RE | Admit: 2017-11-21 | Discharge: 2017-11-21 | Disposition: A | Discharge status: Home / Self Care | Payer: Medicare Other | Source: Ambulatory Visit | Attending: Internal Medicine | Admitting: Internal Medicine

## 2017-11-21 ENCOUNTER — Telehealth: Payer: Self-pay | Admitting: Internal Medicine

## 2017-11-21 VITALS — Wt 256.4 lb

## 2017-11-21 DIAGNOSIS — J849 Interstitial pulmonary disease, unspecified: Secondary | ICD-10-CM

## 2017-11-21 NOTE — Telephone Encounter (Signed)
Called and spoke with Cloyde Reams at [facility] to see if pt was still at office. Cloyde Reams stated that pt was not there.  Called pt who stated to me that her sats had been dropping to the low 80s some at home.   Scheduled pt a visit with TP tomorrow, 11/22/17 at 10:30. Nothing further needed.

## 2017-11-21 NOTE — Progress Notes (Signed)
Daily Session Note  Patient Details  Name: Patty [name] MRN: [id number] Date of Birth: 07/18/1951 Referring Provider:     Pulmonary Rehab Walk Test from 11/02/2017 in [place]  Referring Provider  Dr.Ramaswamy      Encounter Date: 11/21/2017  Check In: Session Check In - 11/21/17 1030      Check-In   Location  [facility]    Staff Present  Rosebud Poles, RN, BSN;Molly diVincenzo, MS, ACSM RCEP, Exercise Physiologist;Noor Witte Ysidro Evert, RN    Supervising physician immediately available to respond to emergencies  [facility] immediately available    Physician(s)  Dr. Maylene Roes    Medication changes reported      No    Fall or balance concerns reported     No    Tobacco Cessation  No Change    Warm-up and Cool-down  Performed as group-led instruction    Resistance Training Performed  Yes    VAD Patient?  No      Pain Assessment   Currently in Pain?  No/denies    Multiple Pain Sites  No       Capillary Blood Glucose: No results found for this or any previous visit (from the past 24 hour(s)).  Exercise Prescription Changes - 11/21/17 1200      Response to Exercise   Blood Pressure (Admit)  120/76    Blood Pressure (Exercise)  124/80    Blood Pressure (Exit)  122/80    Heart Rate (Admit)  94 bpm    Heart Rate (Exercise)  118 bpm    Heart Rate (Exit)  88 bpm    Oxygen Saturation (Admit)  92 %    Oxygen Saturation (Exercise)  94 %    Oxygen Saturation (Exit)  91 %    Rating of Perceived Exertion (Exercise)  19    Perceived Dyspnea (Exercise)  3    Duration  Progress to 45 minutes of aerobic exercise without signs/symptoms of physical distress    Intensity  THRR unchanged      Progression   Progression  Continue to progress workloads to maintain intensity without signs/symptoms of physical distress.      Resistance Training   Training Prescription  Yes    Weight  blue bands    Reps  10-15    Time  10 Minutes      Oxygen    Oxygen  Continuous    Liters  2-3      NuStep   Level  3    Minutes  17    METs  1.7      Arm Ergometer   Level  3    Minutes  17      Track   Laps  10    Minutes  17       Social History   Tobacco Use  Smoking Status Former Smoker  . Packs/day: 1.00  . Years: 45.00  . Pack years: 45.00  . Types: Cigarettes  . Last attempt to quit: 12/26/2008  . Years since quitting: 8.9  Smokeless Tobacco Never Used    Goals Met:  Exercise tolerated well No report of cardiac concerns or symptoms Strength training completed today  Goals Unmet:  Not Applicable  Comments: Service time is from 1030 to 1215    Dr. Rush Farmer is Medical Director for Pulmonary Rehab at [facility].

## 2017-11-22 ENCOUNTER — Ambulatory Visit (INDEPENDENT_AMBULATORY_CARE_PROVIDER_SITE_OTHER): Payer: Medicare Other | Admitting: Adult Health

## 2017-11-22 ENCOUNTER — Encounter: Payer: Self-pay | Admitting: Adult Health

## 2017-11-22 DIAGNOSIS — Z9989 Dependence on other enabling machines and devices: Secondary | ICD-10-CM

## 2017-11-22 DIAGNOSIS — J9611 Chronic respiratory failure with hypoxia: Secondary | ICD-10-CM

## 2017-11-22 DIAGNOSIS — J849 Interstitial pulmonary disease, unspecified: Secondary | ICD-10-CM | POA: Diagnosis not present

## 2017-11-22 DIAGNOSIS — G4733 Obstructive sleep apnea (adult) (pediatric): Secondary | ICD-10-CM

## 2017-11-22 DIAGNOSIS — I2510 Atherosclerotic heart disease of native coronary artery without angina pectoris: Secondary | ICD-10-CM | POA: Diagnosis not present

## 2017-11-22 NOTE — Assessment & Plan Note (Signed)
Very mild ILD noted on CT chest  Has upcoming CT and PFT in few months  Cont to monitor .

## 2017-11-22 NOTE — Assessment & Plan Note (Addendum)
Now with exertional desats , noted in pulmonary rehab.  O2 drop to 88% with walking .  O2 on pulsed o2 >90% at 3l/m .   Plan  Patient Instructions  Begin Oxygen 3l/m with activity .  Continue on CPAP At bedtime   Keep up good work  Continue with pulmonary rehab.  follow up Dr. Chase Caller in 3 months and As needed   Please contact office for sooner follow up if symptoms do not improve or worsen or seek emergency care  Flu shot today .

## 2017-11-22 NOTE — Assessment & Plan Note (Signed)
Doing well on CPAP   Plan  Patient Instructions  Begin Oxygen 3l/m with activity .  Continue on CPAP At bedtime   Keep up good work  Continue with pulmonary rehab.  follow up Dr. Chase Caller in 3 months and As needed   Please contact office for sooner follow up if symptoms do not improve or worsen or seek emergency care  Flu shot today .

## 2017-11-22 NOTE — Progress Notes (Signed)
_0  ID: Patty [name], female    DOB: [birth year], 66 y.o.   MRN: [id number]  Chief Complaint  Patient presents with  . Follow-up    ILD     Referring provider: No ref. provider found  HPI: 66 year old female former smoker seen for pulmonary consult 05/09/2017 for progressive dyspnea since 2004.[name] Patient has polycythemia vera followed by hematology and [place] She has Crohn's disease  TEST  High resolution CT chest May 2018 shows very mild basilar predominant subpleural reticulation and bronchiectasis may be due to nonspecific interstitial pneumonitis.  HSP neg, ACE nml , ANA neg , ANCA neg , CCP neg , Anti DNA DS neg , Sjogren neg , Scleroderma neg, RA factor neg ,  Autoimmune/CTD workup neg.  ESR was elevated c/w inflammation   FEV1 at 77%, ratio 73, FVC 81, no significant bronchodilator response, total lung capacity 100%, DLCO 52%. Overnight oximetry test did show significant desaturations.   HST 05/2017 >AHI 33/hr    11/22/2017 Follow up : ILD Dorothyann Gibbs  Patient returns for a follow-up.  Patient has very mild interstitial lung disease changes on CT chest.  PFT showed minimum restriction and decreased diffusing defect at 52%.  Patient has been recommended for pulmonary rehab.  During her exercise at rehab she is been noted to drop her oxygen with exercise.  It has been recommended to begin oxygen.   Walk test in the office today with O2 saturation at 88% walking on room air . .  O2 saturation 95% at rest on room air.  She denies any increased cough , congestion . No chest pain, edema or orthopnea.   Patient had an overnight oximetry test earlier this year that showed significant desaturations.  Patient was set up for a home sleep study that showed severe sleep apnea with an AHI at 33.8.  Patient was started on CPAP.  Recent download shows excellent compliance with average usage at 9 hours.  AHI 1.4. She feels CPAP has really helped her. Less daytime sleepiness.      Allergies  Allergen Reactions  . Onion Other (See Comments)    sereve diarrhea, stomach pains (Crohn's flares)  . Oxycodone Other (See Comments)    Altered mental status  . Prednisone Other (See Comments)    Altered mood  . Sulfa Drugs Cross Reactors Other (See Comments)    "feels like bugs crawling on me"     There is no immunization history on file for this patient.  Past Medical History:  Diagnosis Date  . Abnormal nuclear stress test    a. 07/2017: cath with no CAD  . Anemia   . Crohn disease ([place])   . OSA on CPAP   . Polycythemia vera ([place])     Tobacco History: Social History   Tobacco Use  Smoking Status Former Smoker  . Packs/day: 1.00  . Years: 45.00  . Pack years: 45.00  . Types: Cigarettes  . Last attempt to quit: 12/26/2008  . Years since quitting: 8.9  Smokeless Tobacco Never Used   Counseling given: Not Answered   Outpatient Encounter Medications as of 11/22/2017  Medication Sig  . aspirin EC 81 MG tablet Take 81 mg by mouth daily.  . B Complex-C (B-COMPLEX WITH VITAMIN C) tablet Take 1 tablet by mouth daily.  . Biotin 5000 MCG SUBL Place 5,000 mcg under the tongue daily.  . Cetirizine-Pseudoephedrine (ZYRTEC-D PO) Take 0.5-1 tablets by mouth daily as needed (for sinus congestion/allergies.).   [name] Cholecalciferol (VITAMIN D)  2000 units tablet Take 2,000 Units by mouth daily.  . Chromium 500 MCG TABS Take 500 mcg by mouth every 3 (three) days.  . clonazePAM (KLONOPIN) 0.5 MG tablet Take 0.125-0.25 mg by mouth daily as needed (for anxiety or sleep.).  [name] cyanocobalamin (,VITAMIN B-12,) 1000 MCG/ML injection Inject 1,000 mcg into the muscle every 30 (thirty) days. Pernicious anemia  . Cyanocobalamin (VITAMIN B-12) 5000 MCG SUBL Place 5,000 mcg under the tongue daily.  . cyclopentolate (CYCLODRYL,CYCLOGYL) 2 % ophthalmic solution Place 1 drop into both eyes 3 (three) times daily as needed (for uveitis or iritis).  . fluticasone (FLONASE) 50 MCG/ACT  nasal spray Place 1 spray into both nostrils daily as needed for allergies.  . furosemide (LASIX) 20 MG tablet Take 1 tablet (20 mg total) by mouth daily. (Patient not taking: Reported on 10/17/2017)  . Magnesium 250 MG TABS Take 250 mg by mouth every other day.  . Misc Natural Products (FOCUSED MIND PO) Take 1 tablet by mouth every 3 (three) days. FOCUS FACTOR NUTRITIONAL SUPPLEMENT  . Multiple Vitamins-Minerals (ICAPS AREDS 2 PO) Take 1 tablet by mouth every 3 (three) days.   . Niacinamide-Zinc-Copper-FA (NICOTINAMIDE ZCF PO) Take 1 capsule by mouth daily. Nicotinamide Riboside-Pterostilbene 250-50 mg ([employer])  . OVER THE COUNTER MEDICATION Place 1 each under the tongue daily. 3-4 DROP DAILY (CBD OIL)  . potassium chloride (MICRO-K) 10 MEQ CR capsule Take 1 capsule (10 mEq total) by mouth daily. (Patient not taking: Reported on 10/17/2017)  . prednisoLONE acetate (PRED FORTE) 1 % ophthalmic suspension Place 1 drop into both eyes 4 (four) times daily as needed (for iritis flare ups (inflammation)).  . Probiotic Product (PROBIOTIC PO) Take 1 capsule by mouth once a week.  . traMADol-acetaminophen (ULTRACET) 37.5-325 MG tablet Take 0.5-1 tablets by mouth every 6 (six) hours as needed (for pain.).  [name] traZODone (DESYREL) 50 MG tablet Take 25-50 mg by mouth at bedtime as needed for sleep.  [name] triprolidine-pseudoephedrine (WAL-ACT) 2.5-60 MG TABS tablet Take 1 tablet by mouth every 6 (six) hours as needed for allergies.  . TURMERIC PO Take 1 capsule by mouth every other day. CURAMIN   No facility-administered encounter medications on file as of 11/22/2017.      Review of Systems  Constitutional:   No  weight loss, night sweats,  Fevers, chills,  +fatigue, or  lassitude.  HEENT:   No headaches,  Difficulty swallowing,  Tooth/dental problems, or  Sore throat,                No sneezing, itching, ear ache, nasal congestion, post nasal drip,   CV:  No chest pain,   Orthopnea, PND, swelling in lower extremities, anasarca, dizziness, palpitations, syncope.   GI  No heartburn, indigestion, abdominal pain, nausea, vomiting, diarrhea, change in bowel habits, loss of appetite, bloody stools.   Resp   No chest wall deformity  Skin: no rash or lesions.  GU: no dysuria, change in color of urine, no urgency or frequency.  No flank pain, no hematuria   MS:  No joint pain or swelling.  No decreased range of motion.  No back pain.    Physical Exam  BP 126/84 (BP Location: Left Arm, Cuff Size: Normal)   Pulse 90   Ht 5' 4" (1.626 m)   Wt 257 lb 6.4 oz (116.8 kg)   SpO2 95%   BMI 44.18 kg/m   GEN: A/Ox3; pleasant , NAD, obese    HEENT:  [place]/AT,  EACs-clear, TMs-wnl, NOSE-clear, THROAT-clear, no lesions, no postnasal drip or exudate noted. Class 2-3 MP airway   NECK:  Supple w/ fair ROM; no JVD; normal carotid impulses w/o bruits; no thyromegaly or nodules palpated; no lymphadenopathy.    RESP  Clear  P & A; w/o, wheezes/ rales/ or rhonchi. no accessory muscle use, no dullness to percussion  CARD:  RRR, no m/r/g, no peripheral edema, pulses intact, no cyanosis or clubbing.  GI:   Soft & nt; nml bowel sounds; no organomegaly or masses detected.   Musco: Warm bil, no deformities or joint swelling noted.   Neuro: alert, no focal deficits noted.    Skin: Warm, no lesions or rashes    Lab Results:  CBC  BNP No results found for: BNP  ProBNP No results found for: PROBNP  Imaging: No results found.   Assessment & Plan:   ILD (interstitial lung disease) ([place]) Very mild ILD noted on CT chest  Has upcoming CT and PFT in few months  Cont to monitor .    Chronic respiratory failure with hypoxia ([facility]) Now with exertional desats , noted in pulmonary rehab.  O2 drop to 88% with walking .  O2 on pulsed o2 >90% at 3l/m .   Plan  Patient Instructions  Begin Oxygen 3l/m with activity .  Continue on CPAP At bedtime   Keep up good work   Continue with pulmonary rehab.  follow up Dr. Chase Caller in 3 months and As needed   Please contact office for sooner follow up if symptoms do not improve or worsen or seek emergency care  Flu shot today .     OSA on CPAP Doing well on CPAP   Plan  Patient Instructions  Begin Oxygen 3l/m with activity .  Continue on CPAP At bedtime   Keep up good work  Continue with pulmonary rehab.  follow up Dr. Chase Caller in 3 months and As needed   Please contact office for sooner follow up if symptoms do not improve or worsen or seek emergency care  Flu shot today .        Rexene Edison, NP 11/22/2017

## 2017-11-22 NOTE — Patient Instructions (Addendum)
Begin Oxygen 3l/m with activity .  Continue on CPAP At bedtime   Keep up good work  Continue with pulmonary rehab.  follow up Dr. Chase Caller in 3 months and As needed   Please contact office for sooner follow up if symptoms do not improve or worsen or seek emergency care  Flu shot today .

## 2017-11-23 ENCOUNTER — Encounter: Payer: Self-pay | Admitting: Cardiology

## 2017-11-23 ENCOUNTER — Encounter (HOSPITAL_COMMUNITY)
Admission: RE | Admit: 2017-11-23 | Discharge: 2017-11-23 | Disposition: A | Discharge status: Home / Self Care | Payer: Medicare Other | Source: Ambulatory Visit | Attending: Internal Medicine | Admitting: Internal Medicine

## 2017-11-23 ENCOUNTER — Ambulatory Visit (INDEPENDENT_AMBULATORY_CARE_PROVIDER_SITE_OTHER): Payer: Medicare Other | Admitting: Cardiology

## 2017-11-23 VITALS — BP 110/76 | HR 92 | Resp 16 | Ht 64.5 in | Wt 254.8 lb

## 2017-11-23 DIAGNOSIS — J849 Interstitial pulmonary disease, unspecified: Secondary | ICD-10-CM | POA: Diagnosis not present

## 2017-11-23 DIAGNOSIS — K5010 Crohn's disease of large intestine without complications: Secondary | ICD-10-CM

## 2017-11-23 DIAGNOSIS — E876 Hypokalemia: Secondary | ICD-10-CM | POA: Diagnosis not present

## 2017-11-23 DIAGNOSIS — I2510 Atherosclerotic heart disease of native coronary artery without angina pectoris: Secondary | ICD-10-CM

## 2017-11-23 DIAGNOSIS — R9430 Abnormal result of cardiovascular function study, unspecified: Secondary | ICD-10-CM | POA: Diagnosis not present

## 2017-11-23 DIAGNOSIS — Z9889 Other specified postprocedural states: Secondary | ICD-10-CM

## 2017-11-23 MED ORDER — FUROSEMIDE 20 MG PO TABS: 20.0000 mg | ORAL_TABLET | Freq: Every day | ORAL | 3 refills | Status: DC

## 2017-11-23 MED ORDER — POTASSIUM CHLORIDE ER 10 MEQ PO CPCR: 10.0000 meq | ORAL_CAPSULE | Freq: Every day | ORAL | 3 refills | Status: DC

## 2017-11-23 MED ORDER — CETIRIZINE HCL 10 MG PO CAPS: ORAL_CAPSULE | ORAL | 3 refills | Status: DC

## 2017-11-23 NOTE — Progress Notes (Signed)
Cardiology Office Note   Date:  11/23/2017   ID:  Patty [name], DOB Oct 11, 1951, MRN [id number]  PCP:  Patient, No Pcp Per  Cardiologist:  Dr. Johnsie Cancel    Chief Complaint  Patient presents with  . Shortness of Breath    medication management      History of Present Illness: Patty [name] is a 66 y.o. female who presents for CAD seen on CT with hx dyspnea.   She had nuc study 06/2017 with EF 53% and + ischemia,  She then had cath.  07/28/17 with no evidence of CAD.  EF on echo 65-70%.  G1DD.  Being seen for ILD and OSA  Has Crohns disease and being w/u for  Autoimmune lung disease On CPAP with nocturnal desats . CT scan chest 05/13/17 reviewed. Autoimmune lung disease On CPAP with nocturnal desats . CT scan chest 05/13/17 reviewed.   Dr. Chase Caller placed her on lasix due to LVEDP elevation- she has na allergy to sulfa and did develop pruritis, not severe but similar to her sulfa allergy.  And with her k+ she could not take tablet but can take micro K.  She also has noticed palpitations more so with stopping the K+.  No chest pain.  She is in pulmonary rehab.    Past Medical History:  Diagnosis Date  . Abnormal nuclear stress test    a. 07/2017: cath with no CAD  . Anemia   . Crohn disease ([place])   . OSA on CPAP   . Polycythemia vera ([place])     Past Surgical History:  Procedure Laterality Date  . COLON RESECTION    . HIP ARTHROPLASTY    . LEFT HEART CATH AND CORONARY ANGIOGRAPHY N/A 07/28/2017   Procedure: Left Heart Cath and Coronary Angiography;  Surgeon: Burnell Blanks, MD;  Location: [facility];  Service: Cardiovascular;  Laterality: N/A;     Current Outpatient Medications  Medication Sig Dispense Refill  . aspirin EC 81 MG tablet Take 81 mg by mouth daily.    . B Complex-C (B-COMPLEX WITH VITAMIN C) tablet Take 1 tablet by mouth daily.    . Biotin 5000 MCG SUBL Place 5,000 mcg under the tongue daily.    . Cetirizine-Pseudoephedrine (ZYRTEC-D PO) Take  0.5-1 tablets by mouth daily as needed (for sinus congestion/allergies.).     [name] Cholecalciferol (VITAMIN D) 2000 units tablet Take 2,000 Units by mouth daily.    . Chromium 500 MCG TABS Take 500 mcg by mouth every 3 (three) days.    . clonazePAM (KLONOPIN) 0.5 MG tablet Take 0.125-0.25 mg by mouth daily as needed (for anxiety or sleep.).    [name] cyanocobalamin (,VITAMIN B-12,) 1000 MCG/ML injection Inject 1,000 mcg into the muscle every 30 (thirty) days. Pernicious anemia    . Cyanocobalamin (VITAMIN B-12) 5000 MCG SUBL Place 5,000 mcg under the tongue daily.    . cyclopentolate (CYCLODRYL,CYCLOGYL) 2 % ophthalmic solution Place 1 drop into both eyes 3 (three) times daily as needed (for uveitis or iritis).    . fluticasone (FLONASE) 50 MCG/ACT nasal spray     . Magnesium 250 MG TABS Take 250 mg by mouth every other day.    . Misc Natural Products (FOCUSED MIND PO) Take 1 tablet by mouth every 3 (three) days. FOCUS FACTOR NUTRITIONAL SUPPLEMENT    . Multiple Vitamins-Minerals (ICAPS AREDS 2 PO) Take 1 tablet by mouth every 3 (three) days.     . Niacinamide-Zinc-Copper-FA (NICOTINAMIDE ZCF PO) Take 1  capsule by mouth daily. Nicotinamide Riboside-Pterostilbene 250-50 mg ([employer])    . OVER THE COUNTER MEDICATION Place 1 each under the tongue daily. 3-4 DROP DAILY (CBD OIL)    . prednisoLONE acetate (PRED FORTE) 1 % ophthalmic suspension Place 1 drop into both eyes 4 (four) times daily as needed (for iritis flare ups (inflammation)).    . Probiotic Product (PROBIOTIC PO) Take 1 capsule by mouth once a week.    . traMADol-acetaminophen (ULTRACET) 37.5-325 MG tablet Take 0.5-1 tablets by mouth every 6 (six) hours as needed (for pain.).    [name] traZODone (DESYREL) 50 MG tablet Take 25-50 mg by mouth at bedtime as needed for sleep.    [name] triprolidine-pseudoephedrine (WAL-ACT) 2.5-60 MG TABS tablet Take 1 tablet by mouth every 6 (six) hours as needed for allergies.    . TURMERIC PO  Take 1 capsule by mouth every other day. CURAMIN    . Cetirizine HCl (ZYRTEC ALLERGY) 10 MG CAPS TAKE 1 TABLET DAILY ALONG WITH THE LASIX 90 capsule 3  . furosemide (LASIX) 20 MG tablet Take 1 tablet (20 mg total) by mouth daily. 90 tablet 3  . potassium chloride (MICRO-K) 10 MEQ CR capsule Take 1 capsule (10 mEq total) by mouth daily. 90 capsule 3   No current facility-administered medications for this visit.     Allergies:   Lasix [furosemide]; Onion; Oxycodone; Potassium-containing compounds; Prednisone; and Sulfa drugs cross reactors    Social History:  The patient  reports that she quit smoking about 8 years ago. Her smoking use included cigarettes. She has a 45.00 pack-year smoking history. she has never used smokeless tobacco. She reports that she drinks alcohol. She reports that she does not use drugs.   Family History:  The patient's family history includes COPD in her brother; Diabetes Mellitus II in her brother and father; Glaucoma in her brother and mother; Heart disease in her brother; Non-Hodgkin's lymphoma in her brother.    ROS:  General:no colds or fevers, + weight gain Skin:no rashes or ulcers HEENT:no blurred vision, no congestion CV:see HPI PUL:see HPI GI:no diarrhea constipation or melena, no indigestion GU:no hematuria, no dysuria MS:no joint pain, no claudication Neuro:no syncope, no lightheadedness Endo:no diabetes, no thyroid disease  Wt Readings from Last 3 Encounters:  11/23/17 254 lb 12.8 oz (115.6 kg)  11/22/17 257 lb 6.4 oz (116.8 kg)  11/21/17 256 lb 6.3 oz (116.3 kg)     PHYSICAL EXAM: VS:  BP 110/76   Pulse 92   Resp 16   Ht 5' 4.5" (1.638 m)   Wt 254 lb 12.8 oz (115.6 kg)   SpO2 97%   BMI 43.06 kg/m  , BMI Body mass index is 43.06 kg/m. General:Pleasant affect, NAD Skin:Warm and dry, brisk capillary refill HEENT:normocephalic, sclera clear, mucus membranes moist Neck:supple, no JVD, no bruits  Heart:S1S2 RRR without murmur, gallup, rub  or click Lungs:clear without rales, rhonchi, or wheezes ZYY:QMGN, non tender, + BS, do not palpate liver spleen or masses Ext:no lower ext edema, 2+ pedal pulses, 2+ radial pulses Neuro:alert and oriented X 3, MAE, follows commands, + facial symmetry    EKG:  EKG is Not ordered today.   Recent Labs: 07/25/2017: Hemoglobin 14.6; Platelets 342 07/29/2017: BUN 14; Creatinine, Ser 0.69; Potassium 3.7; Sodium 140    Lipid Panel No results found for: CHOL, TRIG, HDL, CHOLHDL, VLDL, LDLCALC, LDLDIRECT     Other studies Reviewed: Additional studies/ records that were reviewed today include:  Cardiac cath 07/28/17  Conclusion   1. No angiographic evidence of CAD 2. Elevated filling pressures  Recommendations: No further ischemic workup. Her filling pressures are elevated. She may benefit from diuretic therapy if she becomes symptomatic.    Echo 06/09/17  Study Conclusions  - Left ventricle: The cavity size was normal. Wall thickness was   increased in a pattern of mild LVH. Systolic function was   vigorous. The estimated ejection fraction was in the range of 65%   to 70%. Doppler parameters are consistent with abnormal left   ventricular relaxation (grade 1 diastolic dysfunction).   ASSESSMENT AND PLAN:  1.  Elevated LVEDP with mild pruritis with lasix.  Discussed with pharmacy and Ethacrynic acid is only diuretic that does not cause allergy reaction for those allergic to sulfa.   Discussed with pt and pharmacy.  It is more expensive and has to be ordered.  Our pharmacist notes that other pts with this issue can take lasix with antihistamine such as zyrtec or allegra.  Pt will try this along with 10 meg micro K+- she will notify us if this does not work.  Then will switch to Ethacrynic acid. Follow up with Dr. Johnsie Cancel in 6 months.  2.  No CAD by cath.  3.  Palpitations will check BMP and Mg + level.    4.   ILD followed by Pulmonary  5.  Crohn's disease  6.  OSA on CPAP per  pulmonary     Current medicines are reviewed with the patient today.  The patient Has no concerns regarding medicines.  The following changes have been made:  See above Labs/ tests ordered today include:see above  Disposition:   FU:  see above  Signed, Cecilie Kicks, NP  11/23/2017 [facility] [place], [address] [place], [place] Phone: [phone]; Fax: [phone]

## 2017-11-23 NOTE — Patient Instructions (Addendum)
Medication Instructions:  Your physician has recommended you make the following change in your medication:  [place] 10 MG TAKE 1 TABLET DAILY ALONG WITH THE LASIX    Labwork: TODAY:  BMET & MAGNESIUM  Testing/Procedures: None ordered  Follow-Up: Your physician wants you to follow-up in: [place] DR. Johnsie Cancel   You will receive a reminder letter in the mail two months in advance. If you don't receive a letter, please call our office to schedule the follow-up appointment.    Any Other Special Instructions Will Be Listed Below (If Applicable).     If you need a refill on your cardiac medications before your next appointment, please call your pharmacy.

## 2017-11-23 NOTE — Progress Notes (Signed)
Daily Session Note  Patient Details  Name: Patty [name] MRN: [id number] Date of Birth: 1951-03-27 Referring Provider:     Pulmonary Rehab Walk Test from 11/02/2017 in [place]  Referring Provider  Dr.Ramaswamy      Encounter Date: 11/23/2017  Check In: Session Check In - 11/23/17 1345      Check-In   Location  [facility]    Staff Present  Trish Fountain, RN, Maxcine Ham, RN, BSN;Jo Booze, MS, ACSM RCEP, Exercise Physiologist;Lisa Ysidro Evert, RN;Amber Fair, MS, ACSM RCEP, Exercise Physiologist    Supervising physician immediately available to respond to emergencies  [facility] immediately available    Physician(s)  Dr. [place] Crane    Medication changes reported      No    Fall or balance concerns reported     No    Tobacco Cessation  No Change    Warm-up and Cool-down  Performed as group-led instruction    Resistance Training Performed  Yes    VAD Patient?  No      Pain Assessment   Currently in Pain?  No/denies    Multiple Pain Sites  No       Capillary Blood Glucose: No results found for this or any previous visit (from the past 24 hour(s)).  Exercise Prescription Changes - 11/23/17 1300      Response to Exercise   Blood Pressure (Admit)  132/84    Blood Pressure (Exercise)  124/60    Blood Pressure (Exit)  108/78    Heart Rate (Admit)  94 bpm    Heart Rate (Exercise)  94 bpm    Heart Rate (Exit)  124 bpm    Oxygen Saturation (Admit)  94 %    Oxygen Saturation (Exercise)  95 %    Oxygen Saturation (Exit)  92 %    Rating of Perceived Exertion (Exercise)  13    Perceived Dyspnea (Exercise)  0    Duration  Progress to 45 minutes of aerobic exercise without signs/symptoms of physical distress    Intensity  THRR unchanged      Progression   Progression  Continue to progress workloads to maintain intensity without signs/symptoms of physical distress.      Resistance Training   Training Prescription  Yes     Weight  blue bands    Reps  10-15    Time  10 Minutes      Oxygen   Oxygen  Continuous    Liters  2-3      Arm Ergometer   Level  3    Minutes  17      Track   Laps  10    Minutes  17       Social History   Tobacco Use  Smoking Status Former Smoker  . Packs/day: 1.00  . Years: 45.00  . Pack years: 45.00  . Types: Cigarettes  . Last attempt to quit: 12/26/2008  . Years since quitting: 8.9  Smokeless Tobacco Never Used    Goals Met:  Exercise tolerated well No report of cardiac concerns or symptoms Strength training completed today  Goals Unmet:  Not Applicable  Comments: Service time is from 10:30a to 12:35p    Dr. Rush Farmer is Medical Director for Pulmonary Rehab at [facility].

## 2017-11-24 LAB — BASIC METABOLIC PANEL
BUN/Creatinine Ratio: 17 (ref 12–28)
BUN: 15 mg/dL (ref 8–27)
CO2: 25 mmol/L (ref 20–29)
Calcium: 9.5 mg/dL (ref 8.7–10.3)
Chloride: 105 mmol/L (ref 96–106)
Creatinine, Ser: 0.86 mg/dL (ref 0.57–1.00)
GFR calc Af Amer: 81 mL/min/{1.73_m2} (ref >59)
GFR calc non Af Amer: 70 mL/min/{1.73_m2} (ref >59)
Glucose: 109 mg/dL — ABNORMAL HIGH (ref 65–99)
Potassium: 4.9 mmol/L (ref 3.5–5.2)
Sodium: 144 mmol/L (ref 134–144)

## 2017-11-24 LAB — MAGNESIUM: Magnesium: 2.1 mg/dL (ref 1.6–2.3)

## 2017-11-24 NOTE — Progress Notes (Signed)
Patty [name] 67 y.o. female   DOB: 29-Mar-1951 MRN: [id number]          Nutrition 1. Interstitial pulmonary disease ([place])   2. S/P pulmonary valve repair    Past Medical History:  Diagnosis Date  . Abnormal nuclear stress test    a. 07/2017: cath with no CAD  . Anemia   . Crohn disease ([place])   . OSA on CPAP   . Polycythemia vera ([place])    Meds reviewed.   Ht: Ht Readings from Last 1 Encounters:  11/23/17 5' 4.5" (1.638 m)    Wt:  Wt Readings from Last 3 Encounters:  11/23/17 254 lb 12.8 oz (115.6 kg)  11/22/17 257 lb 6.4 oz (116.8 kg)  11/21/17 256 lb 6.3 oz (116.3 kg)    BMI: 44.2    Current tobacco use? No  Labs:  Lipid Panel  No results found for: CHOL, TRIG, HDL, CHOLHDL, VLDL, LDLCALC, LDLDIRECT  No results found for: HGBA1C  Nutrition Diagnosis ? Food-and nutrition-related knowledge deficit related to lack of exposure to information as related to diagnosis of pulmonary disease ? Obesity related to excessive energy intake as evidenced by a BMI of 44.2  Goal(s) 1. Identify food quantities necessary to achieve wt loss of  -2# per week to a goal wt loss of 6-24 lb at graduation from pulmonary rehab.  Plan:  Pt to attend Pulmonary Nutrition class Will provide client-centered nutrition education as part of interdisciplinary care.   Monitor and evaluate progress toward nutrition goal with team.  Monitor and Evaluate progress toward nutrition goal with team.   Derek Mound, M.Ed, RD, LDN, CDE 11/24/2017 11:57 AM

## 2017-11-27 ENCOUNTER — Encounter: Payer: Self-pay | Admitting: Cardiology

## 2017-11-27 NOTE — Telephone Encounter (Signed)
I placed call to pt and left message on home and mobile numbers to call office.

## 2017-11-27 NOTE — Telephone Encounter (Signed)
May not be cardiac, she could hold lasix and K+ for 2 days and see if symptoms resolve.  If she is having extra heart beats we could do 2 week monitor.

## 2017-11-27 NOTE — Telephone Encounter (Signed)
I spoke with pt and gave her instructions from Cecilie Kicks, NP.  She is feeling better today and does not want to make changes at this time.  She will call us back if she wants to wear a monitor.

## 2017-11-28 ENCOUNTER — Encounter (HOSPITAL_COMMUNITY)
Admission: RE | Admit: 2017-11-28 | Discharge: 2017-11-28 | Disposition: A | Discharge status: Home / Self Care | Payer: Medicare Other | Source: Ambulatory Visit | Attending: Internal Medicine | Admitting: Internal Medicine

## 2017-11-28 ENCOUNTER — Telehealth: Payer: Self-pay | Admitting: Adult Health

## 2017-11-28 VITALS — Wt 255.5 lb

## 2017-11-28 DIAGNOSIS — J849 Interstitial pulmonary disease, unspecified: Secondary | ICD-10-CM

## 2017-11-28 DIAGNOSIS — Z9889 Other specified postprocedural states: Secondary | ICD-10-CM

## 2017-11-28 NOTE — Telephone Encounter (Signed)
Left message for Collie Siad to call back to get more details.

## 2017-11-28 NOTE — Progress Notes (Signed)
Daily Session Note  Patient Details  Name: Patty [name] MRN: [id number] Date of Birth: June 30, 1951 Referring Provider:     Pulmonary Rehab Walk Test from 11/02/2017 in [place]  Referring Provider  Dr.Ramaswamy      Encounter Date: 11/28/2017  Check In: Session Check In - 11/28/17 1215      Check-In   Location  [facility]    Staff Present  Trish Fountain, RN, Maxcine Ham, RN, BSN;Markee Remlinger, MS, ACSM RCEP, Exercise Physiologist;Lisa Ysidro Evert, RN;Amber Fair, MS, ACSM RCEP, Exercise Physiologist    Supervising physician immediately available to respond to emergencies  [facility] immediately available    Physician(s)  Dr. Nevada Crane    Medication changes reported      No    Fall or balance concerns reported     No    Tobacco Cessation  No Change    Warm-up and Cool-down  Performed as group-led instruction    Resistance Training Performed  Yes    VAD Patient?  No      Pain Assessment   Currently in Pain?  No/denies    Multiple Pain Sites  No       Capillary Blood Glucose: No results found for this or any previous visit (from the past 24 hour(s)).  Exercise Prescription Changes - 11/28/17 1200      Response to Exercise   Blood Pressure (Admit)  100/56    Blood Pressure (Exercise)  150/80    Blood Pressure (Exit)  110/60    Heart Rate (Admit)  101 bpm    Heart Rate (Exercise)  127 bpm    Heart Rate (Exit)  106 bpm    Oxygen Saturation (Admit)  91 %    Oxygen Saturation (Exercise)  92 %    Oxygen Saturation (Exit)  91 %    Rating of Perceived Exertion (Exercise)  13    Perceived Dyspnea (Exercise)  2    Duration  Progress to 45 minutes of aerobic exercise without signs/symptoms of physical distress    Intensity  THRR unchanged      Progression   Progression  Continue to progress workloads to maintain intensity without signs/symptoms of physical distress.      Resistance Training   Training Prescription  Yes     Weight  blue bands    Reps  10-15    Time  10 Minutes      Oxygen   Oxygen  Continuous    Liters  2-3      NuStep   Level  3    Minutes  17    METs  1.9      Arm Ergometer   Level  2    Minutes  17      Track   Laps  10    Minutes  17       Social History   Tobacco Use  Smoking Status Former Smoker  . Packs/day: 1.00  . Years: 45.00  . Pack years: 45.00  . Types: Cigarettes  . Last attempt to quit: 12/26/2008  . Years since quitting: 8.9  Smokeless Tobacco Never Used    Goals Met:  Exercise tolerated well No report of cardiac concerns or symptoms Strength training completed today  Goals Unmet:  Not Applicable  Comments: Service time is from 10:30a to 12:05p    Dr. Rush Farmer is Medical Director for Pulmonary Rehab at [facility]

## 2017-11-29 NOTE — Telephone Encounter (Signed)
Called spoke with patient about 10am (for some reason the documentation didn't stick) - [facility] did not receive order for new O2 start Order placed today, marked URGENT Spoke with Melissa w/ [facility] to ensure order was received - documented that [facility] was used and qualified TP not in office today, placed under MR so that order could be cosigned - MR okay with this and cosigned order [employer] sent to [facility] per [employer] request  Will await pt's call back from when [name] called her to update her on the above

## 2017-11-29 NOTE — Telephone Encounter (Signed)
ATC pt, no answer. Left message for pt to call back.

## 2017-11-30 ENCOUNTER — Encounter (HOSPITAL_COMMUNITY)
Admission: RE | Admit: 2017-11-30 | Discharge: 2017-11-30 | Disposition: A | Discharge status: Home / Self Care | Payer: Medicare Other | Source: Ambulatory Visit | Attending: Internal Medicine | Admitting: Internal Medicine

## 2017-11-30 VITALS — Wt 253.1 lb

## 2017-11-30 DIAGNOSIS — J849 Interstitial pulmonary disease, unspecified: Secondary | ICD-10-CM | POA: Diagnosis not present

## 2017-11-30 NOTE — Progress Notes (Signed)
Pulmonary Individual Treatment Plan  Patient Details  Name: Patty [name] MRN: [id number] Date of Birth: Aug 08, 1951 Referring Provider:     Pulmonary Rehab Walk Test from 11/02/2017 in [place]  Referring Provider  Dr.Ramaswamy      Initial Encounter Date:    Pulmonary Rehab Walk Test from 11/02/2017 in [place]  Date  11/02/17  Referring Provider  Dr.Ramaswamy      Visit Diagnosis: Interstitial pulmonary disease ([facility])  Patient's Home Medications on Admission:   Current Outpatient Medications:  .  aspirin EC 81 MG tablet, Take 81 mg by mouth daily., Disp: , Rfl:  .  B Complex-C (B-COMPLEX WITH VITAMIN C) tablet, Take 1 tablet by mouth daily., Disp: , Rfl:  .  Biotin 5000 MCG SUBL, Place 5,000 mcg under the tongue daily., Disp: , Rfl:  .  Cetirizine HCl (ZYRTEC ALLERGY) 10 MG CAPS, TAKE 1 TABLET DAILY ALONG WITH THE LASIX, Disp: 90 capsule, Rfl: 3 .  Cetirizine-Pseudoephedrine (ZYRTEC-D PO), Take 0.5-1 tablets by mouth daily as needed (for sinus congestion/allergies.). , Disp: , Rfl:  .  Cholecalciferol (VITAMIN D) 2000 units tablet, Take 2,000 Units by mouth daily., Disp: , Rfl:  .  Chromium 500 MCG TABS, Take 500 mcg by mouth every 3 (three) days., Disp: , Rfl:  .  clonazePAM (KLONOPIN) 0.5 MG tablet, Take 0.125-0.25 mg by mouth daily as needed (for anxiety or sleep.)., Disp: , Rfl:  .  cyanocobalamin (,VITAMIN B-12,) 1000 MCG/ML injection, Inject 1,000 mcg into the muscle every 30 (thirty) days. Pernicious anemia, Disp: , Rfl:  .  Cyanocobalamin (VITAMIN B-12) 5000 MCG SUBL, Place 5,000 mcg under the tongue daily., Disp: , Rfl:  .  cyclopentolate (CYCLODRYL,CYCLOGYL) 2 % ophthalmic solution, Place 1 drop into both eyes 3 (three) times daily as needed (for uveitis or iritis)., Disp: , Rfl:  .  fluticasone (FLONASE) 50 MCG/ACT nasal spray, , Disp: , Rfl:  .  furosemide (LASIX) 20 MG tablet, Take 1 tablet (20 mg  total) by mouth daily., Disp: 90 tablet, Rfl: 3 .  Magnesium 250 MG TABS, Take 250 mg by mouth every other day., Disp: , Rfl:  .  Misc Natural Products (FOCUSED MIND PO), Take 1 tablet by mouth every 3 (three) days. FOCUS FACTOR NUTRITIONAL SUPPLEMENT, Disp: , Rfl:  .  Multiple Vitamins-Minerals (ICAPS AREDS 2 PO), Take 1 tablet by mouth every 3 (three) days. , Disp: , Rfl:  .  Niacinamide-Zinc-Copper-FA (NICOTINAMIDE ZCF PO), Take 1 capsule by mouth daily. Nicotinamide Riboside-Pterostilbene 250-50 mg ([employer]), Disp: , Rfl:  .  OVER THE COUNTER MEDICATION, Place 1 each under the tongue daily. 3-4 DROP DAILY (CBD OIL), Disp: , Rfl:  .  potassium chloride (MICRO-K) 10 MEQ CR capsule, Take 1 capsule (10 mEq total) by mouth daily., Disp: 90 capsule, Rfl: 3 .  prednisoLONE acetate (PRED FORTE) 1 % ophthalmic suspension, Place 1 drop into both eyes 4 (four) times daily as needed (for iritis flare ups (inflammation))., Disp: , Rfl:  .  Probiotic Product (PROBIOTIC PO), Take 1 capsule by mouth once a week., Disp: , Rfl:  .  traMADol-acetaminophen (ULTRACET) 37.5-325 MG tablet, Take 0.5-1 tablets by mouth every 6 (six) hours as needed (for pain.)., Disp: , Rfl:  .  traZODone (DESYREL) 50 MG tablet, Take 25-50 mg by mouth at bedtime as needed for sleep., Disp: , Rfl:  .  triprolidine-pseudoephedrine (WAL-ACT) 2.5-60 MG TABS tablet, Take 1 tablet  by mouth every 6 (six) hours as needed for allergies., Disp: , Rfl:  .  TURMERIC PO, Take 1 capsule by mouth every other day. CURAMIN, Disp: , Rfl:   Past Medical History: Past Medical History:  Diagnosis Date  . Abnormal nuclear stress test    a. 07/2017: cath with no CAD  . Anemia   . Crohn disease ([place])   . OSA on CPAP   . Polycythemia vera ([place])     Tobacco Use: Social History   Tobacco Use  Smoking Status Former Smoker  . Packs/day: 1.00  . Years: 45.00  . Pack years: 45.00  . Types: Cigarettes  . Last attempt to  quit: 12/26/2008  . Years since quitting: 8.9  Smokeless Tobacco Never Used    Labs: Recent Review Flowsheet Data    There is no flowsheet data to display.      Capillary Blood Glucose: No results found for: GLUCAP   Pulmonary Assessment Scores: Pulmonary Assessment Scores    Row Name 11/02/17 1638 11/09/17 1424       ADL UCSD   ADL Phase  Entry  Entry    SOB Score total  -  22      CAT Score   CAT Score  -  7 Entry      mMRC Score   mMRC Score  2  -       Pulmonary Function Assessment: Pulmonary Function Assessment - 10/30/17 1313      Breath   Bilateral Breath Sounds  Clear    Shortness of Breath  Limiting activity;Yes       Exercise Target Goals:    Exercise Program Goal: Individual exercise prescription set with THRR, safety & activity barriers. Participant demonstrates ability to understand and report RPE using BORG scale, to self-measure pulse accurately, and to acknowledge the importance of the exercise prescription.  Exercise Prescription Goal: Starting with aerobic activity 30 plus minutes a day, 3 days per week for initial exercise prescription. Provide home exercise prescription and guidelines that participant acknowledges understanding prior to discharge.  Activity Barriers & Risk Stratification: Activity Barriers & Cardiac Risk Stratification - 10/30/17 1319      Activity Barriers & Cardiac Risk Stratification   Activity Barriers  Right Hip Replacement;Deconditioning;Muscular Weakness;Shortness of Breath       6 Minute Walk: 6 Minute Walk    Row Name 11/02/17 1635         6 Minute Walk   Phase  Initial     Distance  1000 feet     Walk Time  - 5 minutes 20 seconds     # of Rest Breaks  1 40 seconds     MPH  1.89     METS  2.45     RPE  13     Perceived Dyspnea   3     Symptoms  Yes (comment)     Comments  4/10 right hip pain     Resting HR  93 bpm     Resting BP  134/85     Resting Oxygen Saturation   93 %     Exercise Oxygen  Saturation  during 6 min walk  83 %     Max Ex. HR  154 bpm     Max Ex. BP  179/98     2 Minute Post BP  136/83       Interval HR   1 Minute HR  115     2  Minute HR  127     3 Minute HR  143     4 Minute HR  120     5 Minute HR  138     6 Minute HR  154     2 Minute Post HR  117     Interval Heart Rate?  Yes       Interval Oxygen   Interval Oxygen?  Yes     Baseline Oxygen Saturation %  93 %     1 Minute Oxygen Saturation %  98 %     1 Minute Liters of Oxygen  0 L     2 Minute Oxygen Saturation %  86 %     2 Minute Liters of Oxygen  0 L     3 Minute Oxygen Saturation %  84 %     3 Minute Liters of Oxygen  0 L     4 Minute Oxygen Saturation %  90 % rest break     4 Minute Liters of Oxygen  0 L     5 Minute Oxygen Saturation %  88 %     5 Minute Liters of Oxygen  0 L     6 Minute Oxygen Saturation %  83 %     6 Minute Liters of Oxygen  0 L     2 Minute Post Oxygen Saturation %  96 %     2 Minute Post Liters of Oxygen  0 L        Oxygen Initial Assessment: Oxygen Initial Assessment - 11/02/17 1639      Initial 6 min Walk   Oxygen Used  None      Program Oxygen Prescription   Program Oxygen Prescription  Continuous;E-Tanks    Liters per minute  2    Comments  Patient desaturated to 83% on 6MWT. Has never had oxygen prescribed to her before. Will re-evaluate her first day of rehab to figure out her supplemental oxygen needs       Oxygen Re-Evaluation: Oxygen Re-Evaluation    Row Name 11/09/17 1611 11/28/17 1252           Program Oxygen Prescription   Program Oxygen Prescription  Continuous;E-Tanks  Continuous;E-Tanks      Liters per minute  2  2      Comments  patient desaturated to 84% while walking track and was placed on 2 liters of O2 for exercise.  -        Home Oxygen   Home Oxygen Device  -  None patient has completed evaluation for home o2 and awaiting delivery      Sleep Oxygen Prescription  -  None      Home Exercise Oxygen Prescription  -  None       Home at Rest Exercise Oxygen Prescription  -  None        Goals/Expected Outcomes   Short Term Goals  To learn and exhibit compliance with exercise, home and travel O2 prescription;To learn and understand importance of monitoring SPO2 with pulse oximeter and demonstrate accurate use of the pulse oximeter.;To learn and understand importance of maintaining oxygen saturations>88%;To learn and demonstrate proper pursed lip breathing techniques or other breathing techniques.;To learn and demonstrate proper use of respiratory medications  To learn and exhibit compliance with exercise, home and travel O2 prescription;To learn and understand importance of monitoring SPO2 with pulse oximeter and demonstrate accurate use of the pulse oximeter.;To learn and understand importance of  maintaining oxygen saturations>88%;To learn and demonstrate proper pursed lip breathing techniques or other breathing techniques.;To learn and demonstrate proper use of respiratory medications      Long  Term Goals  Exhibits compliance with exercise, home and travel O2 prescription;Verbalizes importance of monitoring SPO2 with pulse oximeter and return demonstration;Maintenance of O2 saturations>88%;Exhibits proper breathing techniques, such as pursed lip breathing or other method taught during program session;Compliance with respiratory medication;Demonstrates proper use of MDI's  Exhibits compliance with exercise, home and travel O2 prescription;Verbalizes importance of monitoring SPO2 with pulse oximeter and return demonstration;Maintenance of O2 saturations>88%;Exhibits proper breathing techniques, such as pursed lip breathing or other method taught during program session;Compliance with respiratory medication;Demonstrates proper use of MDI's         Oxygen Discharge (Final Oxygen Re-Evaluation): Oxygen Re-Evaluation - 11/28/17 1252      Program Oxygen Prescription   Program Oxygen Prescription  Continuous;E-Tanks    Liters  per minute  2      Home Oxygen   Home Oxygen Device  None patient has completed evaluation for home o2 and awaiting delivery    Sleep Oxygen Prescription  None    Home Exercise Oxygen Prescription  None    Home at Rest Exercise Oxygen Prescription  None      Goals/Expected Outcomes   Short Term Goals  To learn and exhibit compliance with exercise, home and travel O2 prescription;To learn and understand importance of monitoring SPO2 with pulse oximeter and demonstrate accurate use of the pulse oximeter.;To learn and understand importance of maintaining oxygen saturations>88%;To learn and demonstrate proper pursed lip breathing techniques or other breathing techniques.;To learn and demonstrate proper use of respiratory medications    Long  Term Goals  Exhibits compliance with exercise, home and travel O2 prescription;Verbalizes importance of monitoring SPO2 with pulse oximeter and return demonstration;Maintenance of O2 saturations>88%;Exhibits proper breathing techniques, such as pursed lip breathing or other method taught during program session;Compliance with respiratory medication;Demonstrates proper use of MDI's       Initial Exercise Prescription: Initial Exercise Prescription - 11/02/17 1600      Date of Initial Exercise RX and Referring Provider   Date  11/02/17    Referring Provider  Dr.Ramaswamy      Oxygen   Oxygen  -- will re-evaluate needs on first day of exercise      NuStep   Level  2    SPM  80    Minutes  17    METs  1.5      Arm Ergometer   Level  1    Watts  20    Minutes  17      Track   Laps  8    Minutes  17      Prescription Details   Frequency (times per week)  2    Duration  Progress to 45 minutes of aerobic exercise without signs/symptoms of physical distress      Intensity   THRR 40-80% of Max Heartrate  61-122    Ratings of Perceived Exertion  11-13    Perceived Dyspnea  0-4      Progression   Progression  Continue progressive overload as  per policy without signs/symptoms or physical distress.      Resistance Training   Training Prescription  Yes    Weight  blue bands    Reps  10-15       Perform Capillary Blood Glucose checks as needed.  Exercise Prescription Changes: Exercise Prescription Changes  Row Name 11/09/17 1200 11/14/17 1200 11/21/17 1200 11/23/17 1300 11/28/17 1200     Response to Exercise   Blood Pressure (Admit)  134/80  122/82  120/76  132/84  100/56   Blood Pressure (Exercise)  134/92  130/80  124/80  124/60  150/80   Blood Pressure (Exit)  100/70  110/70  122/80  108/78  110/60   Heart Rate (Admit)  107 bpm  88 bpm  94 bpm  94 bpm  101 bpm   Heart Rate (Exercise)  130 bpm  131 bpm  118 bpm  94 bpm  127 bpm   Heart Rate (Exit)  104 bpm  100 bpm  88 bpm  124 bpm  106 bpm   Oxygen Saturation (Admit)  94 %  94 %  92 %  94 %  91 %   Oxygen Saturation (Exercise)  89 %  87 % sat dropped while walking. O2 increased to 3L sat 91%  94 %  95 %  92 %   Oxygen Saturation (Exit)  84 %  97 %  91 %  92 %  91 %   Rating of Perceived Exertion (Exercise)  95  _0 Perceived Dyspnea (Exercise)  _1 0  2   Symptoms  1  -  -  -  -   Duration  Progress to 45 minutes of aerobic exercise without signs/symptoms of physical distress  Progress to 45 minutes of aerobic exercise without signs/symptoms of physical distress  Progress to 45 minutes of aerobic exercise without signs/symptoms of physical distress  Progress to 45 minutes of aerobic exercise without signs/symptoms of physical distress  Progress to 45 minutes of aerobic exercise without signs/symptoms of physical distress   Intensity  Other (comment) 40-80 HRR  THRR unchanged  THRR unchanged  THRR unchanged  THRR unchanged     Progression   Progression  Continue to progress workloads to maintain intensity without signs/symptoms of physical distress.  Continue to progress workloads to maintain intensity without signs/symptoms of physical distress.   Continue to progress workloads to maintain intensity without signs/symptoms of physical distress.  Continue to progress workloads to maintain intensity without signs/symptoms of physical distress.  Continue to progress workloads to maintain intensity without signs/symptoms of physical distress.     Resistance Training   Training Prescription  Yes  Yes  Yes  Yes  Yes   Weight  blue bands  blue bands  blue bands  blue bands  blue bands   Reps  10-15  10-15  10-15  10-15  10-15   Time  -  10 Minutes  10 Minutes  10 Minutes  10 Minutes     Interval Training   Interval Training  -  -  -  -  -     Oxygen   Oxygen  Continuous  Continuous  Continuous  Continuous  Continuous   Liters  2  2-3  2-3  2-3  2-3     NuStep   Level  _2 -  3   Minutes  _3 -  17   METs  2  2  1.7  -  1.9     Arm Ergometer   Level  -  _4 Minutes  -  _5 17  Track   Laps  _0 Minutes  _1 Exercise Comments:   Exercise Goals and Review:   Exercise Goals Re-Evaluation : Exercise Goals Re-Evaluation    [place] Name 11/09/17 1544 11/28/17 0746           Exercise Goal Re-Evaluation   Exercise Goals Review  Increase Physical Activity;Increase Strength and Stamina;Able to understand and use Dyspnea scale;Able to understand and use rate of perceived exertion (RPE) scale;Knowledge and understanding of Target Heart Rate Range (THRR);Understanding of Exercise Prescription  Increase Physical Activity;Increase Strength and Stamina;Able to understand and use Dyspnea scale;Able to understand and use rate of perceived exertion (RPE) scale;Knowledge and understanding of Target Heart Rate Range (THRR);Understanding of Exercise Prescription      Comments  Patient has only attended one rehab sessions. Will cont. to monitor and progress as able.   Patient has only attended 4 exercise sessions. We have requested oxygen for home from [facility]. Patient shows  initiative and motivation. Will cont. to monitor and progress as able.       Expected Outcomes  Through exercise at rehab and at home, patient will increase strength and stamina making ADL's easier to perform. Patient will also have a better understanding of safe exercise and what they are capable of doing outside of clinical supervision.   Through exercise at rehab and at home, patient will increase strength and stamina and understand how to safely exercise at home.          Discharge Exercise Prescription (Final Exercise Prescription Changes): Exercise Prescription Changes - 11/28/17 1200      Response to Exercise   Blood Pressure (Admit)  100/56    Blood Pressure (Exercise)  150/80    Blood Pressure (Exit)  110/60    Heart Rate (Admit)  101 bpm    Heart Rate (Exercise)  127 bpm    Heart Rate (Exit)  106 bpm    Oxygen Saturation (Admit)  91 %    Oxygen Saturation (Exercise)  92 %    Oxygen Saturation (Exit)  91 %    Rating of Perceived Exertion (Exercise)  13    Perceived Dyspnea (Exercise)  2    Duration  Progress to 45 minutes of aerobic exercise without signs/symptoms of physical distress    Intensity  THRR unchanged      Progression   Progression  Continue to progress workloads to maintain intensity without signs/symptoms of physical distress.      Resistance Training   Training Prescription  Yes    Weight  blue bands    Reps  10-15    Time  10 Minutes      Oxygen   Oxygen  Continuous    Liters  2-3      NuStep   Level  3    Minutes  17    METs  1.9      Arm Ergometer   Level  2    Minutes  17      Track   Laps  10    Minutes  17       Nutrition:  Target Goals: Understanding of nutrition guidelines, daily intake of sodium <1565m, cholesterol <2054m calories 30% from fat and 7% or less from saturated fats, daily to have 5 or more servings of fruits and vegetables.  Biometrics: Pre Biometrics - 10/30/17 1319  Pre Biometrics   Grip Strength  31 kg         Nutrition Therapy Plan and Nutrition Goals: Nutrition Therapy & Goals - 11/24/17 1201      Nutrition Therapy   Diet  General, Healthful      Personal Nutrition Goals   Nutrition Goal  Identify food quantities necessary to achieve wt loss of  -2# per week to a goal wt loss of 6-24 lb at graduation from pulmonary rehab.      Intervention Plan   Intervention  Prescribe, educate and counsel regarding individualized specific dietary modifications aiming towards targeted core components such as weight, hypertension, lipid management, diabetes, heart failure and other comorbidities.    Expected Outcomes  Short Term Goal: Understand basic principles of dietary content, such as calories, fat, sodium, cholesterol and nutrients.;Long Term Goal: Adherence to prescribed nutrition plan.       Nutrition Discharge: Rate Your Plate Scores: Nutrition Assessments - 11/24/17 1155      Rate Your Plate Scores   Pre Score  51       Nutrition Goals Re-Evaluation:   Nutrition Goals Discharge (Final Nutrition Goals Re-Evaluation):   Psychosocial: Target Goals: Acknowledge presence or absence of significant depression and/or stress, maximize coping skills, provide positive support system. Participant is able to verbalize types and ability to use techniques and skills needed for reducing stress and depression.  Initial Review & Psychosocial Screening: Initial Psych Review & Screening - 10/30/17 1325      Initial Review   Current issues with  None Identified      Family Dynamics   Good Support System?  Yes      Barriers   Psychosocial barriers to participate in program  There are no identifiable barriers or psychosocial needs.       Quality of Life Scores:   PHQ-9: Recent Review Flowsheet Data    Depression screen [facility] 2/9 10/30/2017   Decreased Interest 0   Down, Depressed, Hopeless 1   PHQ - 2 Score 1     Interpretation of Total Score  Total Score Depression Severity:  1-4 =  Minimal depression, 5-9 = Mild depression, 10-14 = Moderate depression, 15-19 = Moderately severe depression, 20-27 = Severe depression   Psychosocial Evaluation and Intervention: Psychosocial Evaluation - 10/30/17 1325      Psychosocial Evaluation & Interventions   Interventions  Encouraged to exercise with the program and follow exercise prescription    Continue Psychosocial Services   No Follow up required       Psychosocial Re-Evaluation: Psychosocial Re-Evaluation    [place] Name 11/09/17 1615 11/28/17 1256           Psychosocial Re-Evaluation   Current issues with  None Identified  None Identified      Expected Outcomes  patient will remain free from psychosocial barriers to participation in pulmonary rehab  patient will remain free from psychosocial barriers to participation in pulmonary rehab      Continue Psychosocial Services   No Follow up required  No Follow up required         Psychosocial Discharge (Final Psychosocial Re-Evaluation): Psychosocial Re-Evaluation - 11/28/17 1256      Psychosocial Re-Evaluation   Current issues with  None Identified    Expected Outcomes  patient will remain free from psychosocial barriers to participation in pulmonary rehab    Continue Psychosocial Services   No Follow up required       Education: Education Goals: Education classes will be  provided on a weekly basis, covering required topics. Participant will state understanding/return demonstration of topics presented.  Learning Barriers/Preferences:   Education Topics: Risk Factor Reduction:  -Group instruction that is supported by a PowerPoint presentation. Instructor discusses the definition of a risk factor, different risk factors for pulmonary disease, and how the heart and lungs work together.     Nutrition for Pulmonary Patient:  -Group instruction provided by PowerPoint slides, verbal discussion, and written materials to support subject matter. The instructor gives an  explanation and review of healthy diet recommendations, which includes a discussion on weight management, recommendations for fruit and vegetable consumption, as well as protein, fluid, caffeine, fiber, sodium, sugar, and alcohol. Tips for eating when patients are short of breath are discussed.   Pursed Lip Breathing:  -Group instruction that is supported by demonstration and informational handouts. Instructor discusses the benefits of pursed lip and diaphragmatic breathing and detailed demonstration on how to preform both.     Oxygen Safety:  -Group instruction provided by PowerPoint, verbal discussion, and written material to support subject matter. There is an overview of "What is Oxygen" and "Why do we need it".  Instructor also reviews how to create a safe environment for oxygen use, the importance of using oxygen as prescribed, and the risks of noncompliance. There is a brief discussion on traveling with oxygen and resources the patient may utilize.   Oxygen Equipment:  -Group instruction provided by [facility] Staff utilizing handouts, written materials, and equipment demonstrations.   PULMONARY REHAB OTHER RESPIRATORY from 11/23/2017 in [place]  Date  11/23/17  Educator  [facility]  Instruction Review Code  2- meets goals/outcomes      Signs and Symptoms:  -Group instruction provided by written material and verbal discussion to support subject matter. Warning signs and symptoms of infection, stroke, and heart attack are reviewed and when to call the physician/911 reinforced. Tips for preventing the spread of infection discussed.   Advanced Directives:  -Group instruction provided by verbal instruction and written material to support subject matter. Instructor reviews Advanced Directive laws and proper instruction for filling out document.   Pulmonary Video:  -Group video education that reviews the importance of medication and oxygen  compliance, exercise, good nutrition, pulmonary hygiene, and pursed lip and diaphragmatic breathing for the pulmonary patient.   Exercise for the Pulmonary Patient:  -Group instruction that is supported by a PowerPoint presentation. Instructor discusses benefits of exercise, core components of exercise, frequency, duration, and intensity of an exercise routine, importance of utilizing pulse oximetry during exercise, safety while exercising, and options of places to exercise outside of rehab.     Pulmonary Medications:  -Verbally interactive group education provided by instructor with focus on inhaled medications and proper administration.   Anatomy and Physiology of the Respiratory System and Intimacy:  -Group instruction provided by PowerPoint, verbal discussion, and written material to support subject matter. Instructor reviews respiratory cycle and anatomical components of the respiratory system and their functions. Instructor also reviews differences in obstructive and restrictive respiratory diseases with examples of each. Intimacy, Sex, and Sexuality differences are reviewed with a discussion on how relationships can change when diagnosed with pulmonary disease. Common sexual concerns are reviewed.   MD DAY -A group question and answer session with a medical doctor that allows participants to ask questions that relate to their pulmonary disease state.   OTHER EDUCATION -Group or individual verbal, written, or video instructions that support the educational goals of the  pulmonary rehab program.   Knowledge Questionnaire Score: Knowledge Questionnaire Score - 11/09/17 1423      Knowledge Questionnaire Score   Pre Score  15/18       Core Components/Risk Factors/Patient Goals at Admission: Personal Goals and Risk Factors at Admission - 10/30/17 1322      Core Components/Risk Factors/Patient Goals on Admission   Improve shortness of breath with ADL's  Yes    Intervention  Provide  education, individualized exercise plan and daily activity instruction to help decrease symptoms of SOB with activities of daily living.    Expected Outcomes  Short Term: Achieves a reduction of symptoms when performing activities of daily living.    Develop more efficient breathing techniques such as purse lipped breathing and diaphragmatic breathing; and practicing self-pacing with activity  Yes    Intervention  Provide education, demonstration and support about specific breathing techniuqes utilized for more efficient breathing. Include techniques such as pursed lipped breathing, diaphragmatic breathing and self-pacing activity.    Expected Outcomes  Short Term: Participant will be able to demonstrate and use breathing techniques as needed throughout daily activities.    Increase knowledge of respiratory medications and ability to use respiratory devices properly   Yes    Intervention  Provide education and demonstration as needed of appropriate use of medications, inhalers, and oxygen therapy.    Expected Outcomes  Short Term: Achieves understanding of medications use. Understands that oxygen is a medication prescribed by physician. Demonstrates appropriate use of inhaler and oxygen therapy.       Core Components/Risk Factors/Patient Goals Review:  Goals and Risk Factor Review    Row Name 11/09/17 1614 11/28/17 1254 11/28/17 1256         Core Components/Risk Factors/Patient Goals Review   Personal Goals Review  Improve shortness of breath with ADL's;Develop more efficient breathing techniques such as purse lipped breathing and diaphragmatic breathing and practicing self-pacing with activity.;Increase knowledge of respiratory medications and ability to use respiratory devices properly.  Improve shortness of breath with ADL's;Develop more efficient breathing techniques such as purse lipped breathing and diaphragmatic breathing and practicing self-pacing with activity.;Increase knowledge of  respiratory medications and ability to use respiratory devices properly.  -     Review  patient has only attended 1 exercise session since admission and too soon to evaluate progress towards goals  patient has attended 5 session since admission and states she is seeing an improvement in her stamina and strenght during exertion in pulm rehab. She feels this is related to oxygen use. She has not recieved home oxygen as of today. She is awaiting delivery. She feels once she has oxygen for home exertional activities she will see an improvement in her shortness of breath. She still need coaching in PLB.  -     Expected Outcomes  -  -  see admission outcomes        Core Components/Risk Factors/Patient Goals at Discharge (Final Review):  Goals and Risk Factor Review - 11/28/17 1256      Core Components/Risk Factors/Patient Goals Review   Expected Outcomes  see admission outcomes       ITP Comments:   Comments: patient has attended 5 sessions since admission

## 2017-11-30 NOTE — Progress Notes (Signed)
Daily Session Note  Patient Details  Name: Patty [name] MRN: [id number] Date of Birth: 1951-03-26 Referring Provider:     Pulmonary Rehab Walk Test from 11/02/2017 in [place]  Referring Provider  Dr.Ramaswamy      Encounter Date: 11/30/2017  Check In: Session Check In - 11/30/17 1030      Check-In   Location  [facility]    Staff Present  Trish Fountain, RN, Maxcine Ham, RN, BSN;Molly diVincenzo, MS, ACSM RCEP, Exercise Physiologist;Aashir Umholtz Ysidro Evert, RN;Amber Fair, MS, ACSM RCEP, Exercise Physiologist    Supervising physician immediately available to respond to emergencies  [facility] immediately available    Physician(s)  Dr. Starla Link    Medication changes reported      No    Fall or balance concerns reported     No    Tobacco Cessation  No Change    Warm-up and Cool-down  Performed as group-led instruction    Resistance Training Performed  Yes    VAD Patient?  No      Pain Assessment   Currently in Pain?  No/denies    Multiple Pain Sites  No       Capillary Blood Glucose: No results found for this or any previous visit (from the past 24 hour(s)).  Exercise Prescription Changes - 11/30/17 1200      Response to Exercise   Blood Pressure (Admit)  100/80    Blood Pressure (Exercise)  124/70    Blood Pressure (Exit)  114/64    Heart Rate (Admit)  107 bpm    Heart Rate (Exercise)  135 bpm    Heart Rate (Exit)  110 bpm    Oxygen Saturation (Admit)  88 %    Oxygen Saturation (Exercise)  93 %    Oxygen Saturation (Exit)  92 %    Rating of Perceived Exertion (Exercise)  17    Perceived Dyspnea (Exercise)  2    Duration  Progress to 45 minutes of aerobic exercise without signs/symptoms of physical distress    Intensity  THRR unchanged      Progression   Progression  Continue to progress workloads to maintain intensity without signs/symptoms of physical distress.      Resistance Training   Training Prescription  Yes     Weight  blue bands    Reps  10-15    Time  10 Minutes      Oxygen   Oxygen  Continuous    Liters  4      NuStep   Level  3    Minutes  17    METs  2      Track   Laps  10    Minutes  17       Social History   Tobacco Use  Smoking Status Former Smoker  . Packs/day: 1.00  . Years: 45.00  . Pack years: 45.00  . Types: Cigarettes  . Last attempt to quit: 12/26/2008  . Years since quitting: 8.9  Smokeless Tobacco Never Used    Goals Met:  Exercise tolerated well No report of cardiac concerns or symptoms Strength training completed today  Goals Unmet:  Not Applicable  Comments: Service time is from 1030 to 1230    Dr. Rush Farmer is Medical Director for Pulmonary Rehab at [facility]

## 2017-12-01 ENCOUNTER — Encounter: Payer: Self-pay | Admitting: Cardiology

## 2017-12-01 ENCOUNTER — Other Ambulatory Visit: Payer: Self-pay | Admitting: *Deleted

## 2017-12-01 DIAGNOSIS — R0602 Shortness of breath: Secondary | ICD-10-CM

## 2017-12-01 NOTE — Telephone Encounter (Signed)
ATC pt, no answer. Left message for pt to call back.  

## 2017-12-05 ENCOUNTER — Encounter (HOSPITAL_COMMUNITY)
Admission: RE | Admit: 2017-12-05 | Discharge: 2017-12-05 | Disposition: A | Discharge status: Home / Self Care | Payer: Medicare Other | Source: Ambulatory Visit | Attending: Internal Medicine | Admitting: Internal Medicine

## 2017-12-05 ENCOUNTER — Encounter: Payer: Self-pay | Admitting: Cardiology

## 2017-12-05 VITALS — Wt 256.2 lb

## 2017-12-05 DIAGNOSIS — J849 Interstitial pulmonary disease, unspecified: Secondary | ICD-10-CM

## 2017-12-05 DIAGNOSIS — Z9889 Other specified postprocedural states: Secondary | ICD-10-CM

## 2017-12-05 DIAGNOSIS — Z9989 Dependence on other enabling machines and devices: Secondary | ICD-10-CM

## 2017-12-05 DIAGNOSIS — G4733 Obstructive sleep apnea (adult) (pediatric): Secondary | ICD-10-CM

## 2017-12-05 DIAGNOSIS — R9439 Abnormal result of other cardiovascular function study: Secondary | ICD-10-CM

## 2017-12-05 DIAGNOSIS — R0602 Shortness of breath: Secondary | ICD-10-CM

## 2017-12-05 DIAGNOSIS — D45 Polycythemia vera: Secondary | ICD-10-CM

## 2017-12-05 NOTE — Progress Notes (Signed)
Daily Session Note  Patient Details  Name: Patty [name] MRN: [id number] Date of Birth: 1951/08/27 Referring Provider:     Pulmonary Rehab Walk Test from 11/02/2017 in [place]  Referring Provider  Dr.Ramaswamy      Encounter Date: 12/05/2017  Check In: Session Check In - 12/05/17 1031      Check-In   Location  [facility]    Staff Present  Su Hilt, MS, ACSM RCEP, Exercise Physiologist;Portia Rollene Rotunda, RN, Maxcine Ham, RN, BSN    Supervising physician immediately available to respond to emergencies  [facility] immediately available    Physician(s)  Dr. Starla Link    Medication changes reported      No    Fall or balance concerns reported     No    Tobacco Cessation  No Change    Warm-up and Cool-down  Performed as group-led instruction    Resistance Training Performed  Yes    VAD Patient?  No      Pain Assessment   Currently in Pain?  No/denies    Multiple Pain Sites  No       Capillary Blood Glucose: No results found for this or any previous visit (from the past 24 hour(s)).  Exercise Prescription Changes - 12/05/17 1100      Response to Exercise   Blood Pressure (Admit)  108/64    Blood Pressure (Exercise)  126/70    Blood Pressure (Exit)  112/78    Heart Rate (Admit)  118 bpm    Heart Rate (Exercise)  133 bpm    Heart Rate (Exit)  110 bpm    Oxygen Saturation (Admit)  94 %    Oxygen Saturation (Exercise)  93 %    Oxygen Saturation (Exit)  92 %    Rating of Perceived Exertion (Exercise)  13    Perceived Dyspnea (Exercise)  2    Duration  Progress to 45 minutes of aerobic exercise without signs/symptoms of physical distress    Intensity  THRR unchanged      Progression   Progression  Continue to progress workloads to maintain intensity without signs/symptoms of physical distress.      Resistance Training   Training Prescription  Yes    Weight  blue bands    Reps  10-15    Time  10 Minutes       Oxygen   Oxygen  Continuous    Liters  4      NuStep   Level  4    SPM  80    Minutes  17    METs  2      Arm Ergometer   Level  3    Minutes  17      Track   Laps  8    Minutes  17       Social History   Tobacco Use  Smoking Status Former Smoker  . Packs/day: 1.00  . Years: 45.00  . Pack years: 45.00  . Types: Cigarettes  . Last attempt to quit: 12/26/2008  . Years since quitting: 8.9  Smokeless Tobacco Never Used    Goals Met:  Exercise tolerated well No report of cardiac concerns or symptoms Strength training completed today  Goals Unmet:  Not Applicable  Comments: Service time is from 10:30a to 12:00p    Dr. Rush Farmer is Medical Director for Pulmonary Rehab at [facility].

## 2017-12-06 NOTE — Telephone Encounter (Signed)
lmtcb x2 for pt to see if she received her oxygen.

## 2017-12-06 NOTE — Telephone Encounter (Signed)
Pt calling to let Ria Comment know that she has received her oxygen.  She said she got it on 12/5-tr

## 2017-12-06 NOTE — Telephone Encounter (Signed)
Noted. Will close this message.

## 2017-12-07 ENCOUNTER — Other Ambulatory Visit: Payer: Medicare Other | Admitting: *Deleted

## 2017-12-07 ENCOUNTER — Encounter (HOSPITAL_COMMUNITY): Payer: Medicare Other

## 2017-12-07 DIAGNOSIS — R0602 Shortness of breath: Secondary | ICD-10-CM

## 2017-12-08 ENCOUNTER — Telehealth: Payer: Self-pay | Admitting: Internal Medicine

## 2017-12-08 DIAGNOSIS — R0602 Shortness of breath: Secondary | ICD-10-CM | POA: Diagnosis not present

## 2017-12-08 NOTE — Telephone Encounter (Signed)
Patty [name] just routing for follow up to document when it was faxed back to [facility].

## 2017-12-08 NOTE — Telephone Encounter (Signed)
Signed and given to emily pinion 12/08/2017 pm  Dr. Brand Males, M.D., [facility].C.P Pulmonary and Critical Care Medicine Staff Physician, [place] Director - Interstitial Lung Disease  Program  Pulmonary [place] at [place], [place], [address]  Pager: [phone], If no answer or between  15:00h - 7:00h: call [phone] Telephone: [phone]

## 2017-12-08 NOTE — Telephone Encounter (Signed)
Form is in MR urgent folder to be reviewed. Please advise.  Called Molly to let her know and left a detailed message.

## 2017-12-09 LAB — BASIC METABOLIC PANEL WITH GFR
BUN/Creatinine Ratio: 13 (ref 12–28)
BUN: 10 mg/dL (ref 8–27)
CO2: 28 mmol/L (ref 20–29)
Calcium: 9.3 mg/dL (ref 8.7–10.3)
Chloride: 100 mmol/L (ref 96–106)
Creatinine, Ser: 0.76 mg/dL (ref 0.57–1.00)
GFR calc Af Amer: 94 mL/min/{1.73_m2}
GFR calc non Af Amer: 81 mL/min/{1.73_m2}
Glucose: 64 mg/dL — ABNORMAL LOW (ref 65–99)
Potassium: 4.2 mmol/L (ref 3.5–5.2)
Sodium: 142 mmol/L (ref 134–144)

## 2017-12-11 NOTE — Telephone Encounter (Signed)
Faxed this form today, 12/11/17.  Nothing further needed.

## 2017-12-12 ENCOUNTER — Encounter (HOSPITAL_COMMUNITY)
Admission: RE | Admit: 2017-12-12 | Discharge: 2017-12-12 | Disposition: A | Discharge status: Home / Self Care | Payer: Medicare Other | Source: Ambulatory Visit | Attending: Internal Medicine | Admitting: Internal Medicine

## 2017-12-12 DIAGNOSIS — J849 Interstitial pulmonary disease, unspecified: Secondary | ICD-10-CM | POA: Diagnosis not present

## 2017-12-12 NOTE — Progress Notes (Signed)
Daily Session Note  Patient Details  Name: Patty [name] MRN: [id number] Date of Birth: [birth year] Referring Provider:     Pulmonary Rehab Walk Test from 11/02/2017 in [place]  Referring Provider  Dr.Ramaswamy      Encounter Date: 12/12/2017  Check In: Session Check In - 12/12/17 1437      Check-In   Location  [facility]    Staff Present  [place] Poles, RN, BSN;Molly diVincenzo, MS, ACSM RCEP, Exercise Physiologist;Mozetta Murfin Ysidro Evert, RN;Portia Rollene Rotunda, RN, BSN    Supervising physician immediately available to respond to emergencies  [facility] immediately available    Physician(s)  Dr. Eliseo Squires    Medication changes reported      No    Fall or balance concerns reported     No    Tobacco Cessation  No Change    Warm-up and Cool-down  Performed as group-led instruction    Resistance Training Performed  Yes    VAD Patient?  No      Pain Assessment   Multiple Pain Sites  No       Capillary Blood Glucose: No results found for this or any previous visit (from the past 24 hour(s)).  Exercise Prescription Changes - 12/12/17 1400      Response to Exercise   Blood Pressure (Admit)  134/60    Blood Pressure (Exercise)  150/90    Blood Pressure (Exit)  108/66    Heart Rate (Admit)  91 bpm    Heart Rate (Exercise)  126 bpm    Heart Rate (Exit)  96 bpm    Oxygen Saturation (Admit)  90 %    Oxygen Saturation (Exercise)  92 %    Oxygen Saturation (Exit)  92 %    Rating of Perceived Exertion (Exercise)  15    Perceived Dyspnea (Exercise)  2    Duration  Progress to 45 minutes of aerobic exercise without signs/symptoms of physical distress    Intensity  THRR unchanged      Progression   Progression  Continue to progress workloads to maintain intensity without signs/symptoms of physical distress.      Resistance Training   Training Prescription  Yes    Weight  blue bands    Reps  10-15    Time  10 Minutes      Oxygen   Oxygen   Continuous    Liters  4      NuStep   Level  4    Minutes  17    METs  2      Arm Ergometer   Level  3    Minutes  17      Track   Laps  6    Minutes  17       Social History   Tobacco Use  Smoking Status Former Smoker  . Packs/day: 1.00  . Years: 45.00  . Pack years: 45.00  . Types: Cigarettes  . Last attempt to quit: 12/26/2008  . Years since quitting: 8.9  Smokeless Tobacco Never Used    Goals Met:  Exercise tolerated well No report of cardiac concerns or symptoms Strength training completed today  Goals Unmet:  Not Applicable  Comments: Service time is from 1030 to 1215    Dr. Rush Farmer is Medical Director for Pulmonary Rehab at [facility].

## 2017-12-12 NOTE — Progress Notes (Signed)
I have reviewed a Home Exercise Prescription with [name] . Kellyjo is not currently exercising at home.  The patient was advised to walk 2-3 days a week for 30-45 minutes.  [name] and I discussed how to progress their exercise prescription.  The patient stated that their goals were to decrease weight and increase stamina.  The patient stated that they understand the exercise prescription.  We reviewed exercise guidelines, target heart rate during exercise, oxygen use, weather, home pulse oximeter, endpoints for exercise, and goals.  Patient is encouraged to come to me with any questions. I will continue to follow up with the patient to assist them with progression and safety.

## 2017-12-14 ENCOUNTER — Encounter (HOSPITAL_COMMUNITY)
Admission: RE | Admit: 2017-12-14 | Discharge: 2017-12-14 | Disposition: A | Discharge status: Home / Self Care | Payer: Medicare Other | Source: Ambulatory Visit | Attending: Internal Medicine | Admitting: Internal Medicine

## 2017-12-14 VITALS — Wt 255.1 lb

## 2017-12-14 DIAGNOSIS — J849 Interstitial pulmonary disease, unspecified: Secondary | ICD-10-CM

## 2017-12-14 DIAGNOSIS — Z9889 Other specified postprocedural states: Secondary | ICD-10-CM

## 2017-12-14 NOTE — Progress Notes (Signed)
Daily Session Note  Patient Details  Name: Patty [name] MRN: [id number] Date of Birth: 1951/09/08 Referring Provider:     Pulmonary Rehab Walk Test from 11/02/2017 in [place]  Referring Provider  Dr.Ramaswamy      Encounter Date: 12/14/2017  Check In: Session Check In - 12/14/17 1121      Check-In   Location  [facility]    Staff Present  Rodney Langton, RN;Portia Rollene Rotunda, RN, BSN;Molly diVincenzo, MS, ACSM RCEP, Exercise Physiologist    Supervising physician immediately available to respond to emergencies  [facility] immediately available    Physician(s)  Dr. Wendee Beavers    Medication changes reported      No    Fall or balance concerns reported     No    Tobacco Cessation  No Change    Warm-up and Cool-down  Performed as group-led instruction    Resistance Training Performed  Yes    VAD Patient?  No      Pain Assessment   Currently in Pain?  No/denies    Multiple Pain Sites  No       Capillary Blood Glucose: No results found for this or any previous visit (from the past 24 hour(s)).  Exercise Prescription Changes - 12/14/17 1300      Response to Exercise   Blood Pressure (Admit)  110/70    Blood Pressure (Exercise)  122/62    Blood Pressure (Exit)  90/60 drank H20 recheck 106/60    Heart Rate (Admit)  97 bpm    Heart Rate (Exercise)  108 bpm    Heart Rate (Exit)  97 bpm    Oxygen Saturation (Admit)  97 %    Oxygen Saturation (Exercise)  95 %    Oxygen Saturation (Exit)  91 %    Rating of Perceived Exertion (Exercise)  11    Perceived Dyspnea (Exercise)  0    Duration  Progress to 45 minutes of aerobic exercise without signs/symptoms of physical distress    Intensity  THRR unchanged      Progression   Progression  Continue to progress workloads to maintain intensity without signs/symptoms of physical distress.      Resistance Training   Training Prescription  Yes    Weight  blue bands    Reps  10-15    Time   10 Minutes      Oxygen   Oxygen  Continuous    Liters  4      NuStep   Level  4    Minutes  17    METs  2.1      Arm Ergometer   Level  4    Minutes  17       Social History   Tobacco Use  Smoking Status Former Smoker  . Packs/day: 1.00  . Years: 45.00  . Pack years: 45.00  . Types: Cigarettes  . Last attempt to quit: 12/26/2008  . Years since quitting: 8.9  Smokeless Tobacco Never Used    Goals Met:  Exercise tolerated well No report of cardiac concerns or symptoms Strength training completed today  Goals Unmet:  Not Applicable  Comments: Service time is from 1030 to [place]    Dr. Rush Farmer is Medical Director for Pulmonary Rehab at [facility].

## 2017-12-21 ENCOUNTER — Encounter (HOSPITAL_COMMUNITY)
Admission: RE | Admit: 2017-12-21 | Discharge: 2017-12-21 | Disposition: A | Discharge status: Home / Self Care | Payer: Medicare Other | Source: Ambulatory Visit | Attending: Internal Medicine | Admitting: Internal Medicine

## 2017-12-21 VITALS — Wt 254.2 lb

## 2017-12-21 DIAGNOSIS — J849 Interstitial pulmonary disease, unspecified: Secondary | ICD-10-CM

## 2017-12-21 NOTE — Progress Notes (Signed)
Pulmonary Individual Treatment Plan  Patient Details  Name: Patty [name] MRN: [id number] Date of Birth: Aug 08, 1951 Referring Provider:     Pulmonary Rehab Walk Test from 11/02/2017 in [place]  Referring Provider  Dr.Ramaswamy      Initial Encounter Date:    Pulmonary Rehab Walk Test from 11/02/2017 in [place]  Date  11/02/17  Referring Provider  Dr.Ramaswamy      Visit Diagnosis: Interstitial pulmonary disease ([facility])  Patient's Home Medications on Admission:   Current Outpatient Medications:  .  aspirin EC 81 MG tablet, Take 81 mg by mouth daily., Disp: , Rfl:  .  B Complex-C (B-COMPLEX WITH VITAMIN C) tablet, Take 1 tablet by mouth daily., Disp: , Rfl:  .  Biotin 5000 MCG SUBL, Place 5,000 mcg under the tongue daily., Disp: , Rfl:  .  Cetirizine HCl (ZYRTEC ALLERGY) 10 MG CAPS, TAKE 1 TABLET DAILY ALONG WITH THE LASIX, Disp: 90 capsule, Rfl: 3 .  Cetirizine-Pseudoephedrine (ZYRTEC-D PO), Take 0.5-1 tablets by mouth daily as needed (for sinus congestion/allergies.). , Disp: , Rfl:  .  Cholecalciferol (VITAMIN D) 2000 units tablet, Take 2,000 Units by mouth daily., Disp: , Rfl:  .  Chromium 500 MCG TABS, Take 500 mcg by mouth every 3 (three) days., Disp: , Rfl:  .  clonazePAM (KLONOPIN) 0.5 MG tablet, Take 0.125-0.25 mg by mouth daily as needed (for anxiety or sleep.)., Disp: , Rfl:  .  cyanocobalamin (,VITAMIN B-12,) 1000 MCG/ML injection, Inject 1,000 mcg into the muscle every 30 (thirty) days. Pernicious anemia, Disp: , Rfl:  .  Cyanocobalamin (VITAMIN B-12) 5000 MCG SUBL, Place 5,000 mcg under the tongue daily., Disp: , Rfl:  .  cyclopentolate (CYCLODRYL,CYCLOGYL) 2 % ophthalmic solution, Place 1 drop into both eyes 3 (three) times daily as needed (for uveitis or iritis)., Disp: , Rfl:  .  fluticasone (FLONASE) 50 MCG/ACT nasal spray, , Disp: , Rfl:  .  furosemide (LASIX) 20 MG tablet, Take 1 tablet (20 mg  total) by mouth daily., Disp: 90 tablet, Rfl: 3 .  Magnesium 250 MG TABS, Take 250 mg by mouth every other day., Disp: , Rfl:  .  Misc Natural Products (FOCUSED MIND PO), Take 1 tablet by mouth every 3 (three) days. FOCUS FACTOR NUTRITIONAL SUPPLEMENT, Disp: , Rfl:  .  Multiple Vitamins-Minerals (ICAPS AREDS 2 PO), Take 1 tablet by mouth every 3 (three) days. , Disp: , Rfl:  .  Niacinamide-Zinc-Copper-FA (NICOTINAMIDE ZCF PO), Take 1 capsule by mouth daily. Nicotinamide Riboside-Pterostilbene 250-50 mg ([employer]), Disp: , Rfl:  .  OVER THE COUNTER MEDICATION, Place 1 each under the tongue daily. 3-4 DROP DAILY (CBD OIL), Disp: , Rfl:  .  potassium chloride (MICRO-K) 10 MEQ CR capsule, Take 1 capsule (10 mEq total) by mouth daily., Disp: 90 capsule, Rfl: 3 .  prednisoLONE acetate (PRED FORTE) 1 % ophthalmic suspension, Place 1 drop into both eyes 4 (four) times daily as needed (for iritis flare ups (inflammation))., Disp: , Rfl:  .  Probiotic Product (PROBIOTIC PO), Take 1 capsule by mouth once a week., Disp: , Rfl:  .  traMADol-acetaminophen (ULTRACET) 37.5-325 MG tablet, Take 0.5-1 tablets by mouth every 6 (six) hours as needed (for pain.)., Disp: , Rfl:  .  traZODone (DESYREL) 50 MG tablet, Take 25-50 mg by mouth at bedtime as needed for sleep., Disp: , Rfl:  .  triprolidine-pseudoephedrine (WAL-ACT) 2.5-60 MG TABS tablet, Take 1 tablet  by mouth every 6 (six) hours as needed for allergies., Disp: , Rfl:  .  TURMERIC PO, Take 1 capsule by mouth every other day. CURAMIN, Disp: , Rfl:   Past Medical History: Past Medical History:  Diagnosis Date  . Abnormal nuclear stress test    a. 07/2017: cath with no CAD  . Anemia   . Crohn disease ([place])   . OSA on CPAP   . Polycythemia vera ([place])     Tobacco Use: Social History   Tobacco Use  Smoking Status Former Smoker  . Packs/day: 1.00  . Years: 45.00  . Pack years: 45.00  . Types: Cigarettes  . Last attempt to  quit: 12/26/2008  . Years since quitting: 8.9  Smokeless Tobacco Never Used    Labs: Recent Review Flowsheet Data    There is no flowsheet data to display.      Capillary Blood Glucose: No results found for: GLUCAP   Pulmonary Assessment Scores: Pulmonary Assessment Scores    Row Name 11/02/17 1638 11/09/17 1424       ADL UCSD   ADL Phase  Entry  Entry    SOB Score total  -  22      CAT Score   CAT Score  -  7 Entry      mMRC Score   mMRC Score  2  -       Pulmonary Function Assessment: Pulmonary Function Assessment - 10/30/17 1313      Breath   Bilateral Breath Sounds  Clear    Shortness of Breath  Limiting activity;Yes       Exercise Target Goals:    Exercise Program Goal: Individual exercise prescription set with THRR, safety & activity barriers. Participant demonstrates ability to understand and report RPE using BORG scale, to self-measure pulse accurately, and to acknowledge the importance of the exercise prescription.  Exercise Prescription Goal: Starting with aerobic activity 30 plus minutes a day, 3 days per week for initial exercise prescription. Provide home exercise prescription and guidelines that participant acknowledges understanding prior to discharge.  Activity Barriers & Risk Stratification: Activity Barriers & Cardiac Risk Stratification - 10/30/17 1319      Activity Barriers & Cardiac Risk Stratification   Activity Barriers  Right Hip Replacement;Deconditioning;Muscular Weakness;Shortness of Breath       6 Minute Walk: 6 Minute Walk    Row Name 11/02/17 1635         6 Minute Walk   Phase  Initial     Distance  1000 feet     Walk Time  - 5 minutes 20 seconds     # of Rest Breaks  1 40 seconds     MPH  1.89     METS  2.45     RPE  13     Perceived Dyspnea   3     Symptoms  Yes (comment)     Comments  4/10 right hip pain     Resting HR  93 bpm     Resting BP  134/85     Resting Oxygen Saturation   93 %     Exercise Oxygen  Saturation  during 6 min walk  83 %     Max Ex. HR  154 bpm     Max Ex. BP  179/98     2 Minute Post BP  136/83       Interval HR   1 Minute HR  115     2  Minute HR  127     3 Minute HR  143     4 Minute HR  120     5 Minute HR  138     6 Minute HR  154     2 Minute Post HR  117     Interval Heart Rate?  Yes       Interval Oxygen   Interval Oxygen?  Yes     Baseline Oxygen Saturation %  93 %     1 Minute Oxygen Saturation %  98 %     1 Minute Liters of Oxygen  0 L     2 Minute Oxygen Saturation %  86 %     2 Minute Liters of Oxygen  0 L     3 Minute Oxygen Saturation %  84 %     3 Minute Liters of Oxygen  0 L     4 Minute Oxygen Saturation %  90 % rest break     4 Minute Liters of Oxygen  0 L     5 Minute Oxygen Saturation %  88 %     5 Minute Liters of Oxygen  0 L     6 Minute Oxygen Saturation %  83 %     6 Minute Liters of Oxygen  0 L     2 Minute Post Oxygen Saturation %  96 %     2 Minute Post Liters of Oxygen  0 L        Oxygen Initial Assessment: Oxygen Initial Assessment - 11/02/17 1639      Initial 6 min Walk   Oxygen Used  None      Program Oxygen Prescription   Program Oxygen Prescription  Continuous;E-Tanks    Liters per minute  2    Comments  Patient desaturated to 83% on 6MWT. Has never had oxygen prescribed to her before. Will re-evaluate her first day of rehab to figure out her supplemental oxygen needs       Oxygen Re-Evaluation: Oxygen Re-Evaluation    Row Name 11/09/17 1611 11/28/17 1252 12/21/17 0713         Program Oxygen Prescription   Program Oxygen Prescription  Continuous;E-Tanks  Continuous;E-Tanks  Continuous;E-Tanks     Liters per minute  _0 Comments  patient desaturated to 84% while walking track and was placed on 2 liters of O2 for exercise.  -  patient desaturated to 84% while walking track and was placed on 2 liters of O2 for exercise.       Home Oxygen   Home Oxygen Device  -  None patient has completed evaluation  for home o2 and awaiting delivery  None     Sleep Oxygen Prescription  -  None  None     Home Exercise Oxygen Prescription  -  None  None     Home at Rest Exercise Oxygen Prescription  -  None  None       Goals/Expected Outcomes   Short Term Goals  To learn and exhibit compliance with exercise, home and travel O2 prescription;To learn and understand importance of monitoring SPO2 with pulse oximeter and demonstrate accurate use of the pulse oximeter.;To learn and understand importance of maintaining oxygen saturations>88%;To learn and demonstrate proper pursed lip breathing techniques or other breathing techniques.;To learn and demonstrate proper use of respiratory medications  To learn and exhibit compliance with exercise, home and travel O2 prescription;To  learn and understand importance of monitoring SPO2 with pulse oximeter and demonstrate accurate use of the pulse oximeter.;To learn and understand importance of maintaining oxygen saturations>88%;To learn and demonstrate proper pursed lip breathing techniques or other breathing techniques.;To learn and demonstrate proper use of respiratory medications  To learn and exhibit compliance with exercise, home and travel O2 prescription;To learn and understand importance of monitoring SPO2 with pulse oximeter and demonstrate accurate use of the pulse oximeter.;To learn and understand importance of maintaining oxygen saturations>88%;To learn and demonstrate proper pursed lip breathing techniques or other breathing techniques.;To learn and demonstrate proper use of respiratory medications     Long  Term Goals  Exhibits compliance with exercise, home and travel O2 prescription;Verbalizes importance of monitoring SPO2 with pulse oximeter and return demonstration;Maintenance of O2 saturations>88%;Exhibits proper breathing techniques, such as pursed lip breathing or other method taught during program session;Compliance with respiratory medication;Demonstrates  proper use of MDI's  Exhibits compliance with exercise, home and travel O2 prescription;Verbalizes importance of monitoring SPO2 with pulse oximeter and return demonstration;Maintenance of O2 saturations>88%;Exhibits proper breathing techniques, such as pursed lip breathing or other method taught during program session;Compliance with respiratory medication;Demonstrates proper use of MDI's  Exhibits compliance with exercise, home and travel O2 prescription;Verbalizes importance of monitoring SPO2 with pulse oximeter and return demonstration;Maintenance of O2 saturations>88%;Exhibits proper breathing techniques, such as pursed lip breathing or other method taught during program session;Compliance with respiratory medication;Demonstrates proper use of MDI's     Goals/Expected Outcomes  -  -  patient will state compliance with home O2 therapy        Oxygen Discharge (Final Oxygen Re-Evaluation): Oxygen Re-Evaluation - 12/21/17 0713      Program Oxygen Prescription   Program Oxygen Prescription  Continuous;E-Tanks    Liters per minute  2    Comments  patient desaturated to 84% while walking track and was placed on 2 liters of O2 for exercise.      Home Oxygen   Home Oxygen Device  None    Sleep Oxygen Prescription  None    Home Exercise Oxygen Prescription  None    Home at Rest Exercise Oxygen Prescription  None      Goals/Expected Outcomes   Short Term Goals  To learn and exhibit compliance with exercise, home and travel O2 prescription;To learn and understand importance of monitoring SPO2 with pulse oximeter and demonstrate accurate use of the pulse oximeter.;To learn and understand importance of maintaining oxygen saturations>88%;To learn and demonstrate proper pursed lip breathing techniques or other breathing techniques.;To learn and demonstrate proper use of respiratory medications    Long  Term Goals  Exhibits compliance with exercise, home and travel O2 prescription;Verbalizes importance  of monitoring SPO2 with pulse oximeter and return demonstration;Maintenance of O2 saturations>88%;Exhibits proper breathing techniques, such as pursed lip breathing or other method taught during program session;Compliance with respiratory medication;Demonstrates proper use of MDI's    Goals/Expected Outcomes  patient will state compliance with home O2 therapy       Initial Exercise Prescription: Initial Exercise Prescription - 11/02/17 1600      Date of Initial Exercise RX and Referring Provider   Date  11/02/17    Referring Provider  Dr.Ramaswamy      Oxygen   Oxygen  -- will re-evaluate needs on first day of exercise      NuStep   Level  2    SPM  80    Minutes  17    METs  1.5  Arm Ergometer   Level  1    Watts  20    Minutes  17      Track   Laps  8    Minutes  17      Prescription Details   Frequency (times per week)  2    Duration  Progress to 45 minutes of aerobic exercise without signs/symptoms of physical distress      Intensity   THRR 40-80% of Max Heartrate  61-122    Ratings of Perceived Exertion  11-13    Perceived Dyspnea  0-4      Progression   Progression  Continue progressive overload as per policy without signs/symptoms or physical distress.      Resistance Training   Training Prescription  Yes    Weight  blue bands    Reps  10-15       Perform Capillary Blood Glucose checks as needed.  Exercise Prescription Changes: Exercise Prescription Changes    Row Name 11/09/17 1200 11/14/17 1200 11/21/17 1200 11/23/17 1300 11/28/17 1200     Response to Exercise   Blood Pressure (Admit)  134/80  122/82  120/76  132/84  100/56   Blood Pressure (Exercise)  134/92  130/80  124/80  124/60  150/80   Blood Pressure (Exit)  100/70  110/70  122/80  108/78  110/60   Heart Rate (Admit)  107 bpm  88 bpm  94 bpm  94 bpm  101 bpm   Heart Rate (Exercise)  130 bpm  131 bpm  118 bpm  94 bpm  127 bpm   Heart Rate (Exit)  104 bpm  100 bpm  88 bpm  124 bpm  106  bpm   Oxygen Saturation (Admit)  94 %  94 %  92 %  94 %  91 %   Oxygen Saturation (Exercise)  89 %  87 % sat dropped while walking. O2 increased to 3L sat 91%  94 %  95 %  92 %   Oxygen Saturation (Exit)  84 %  97 %  91 %  92 %  91 %   Rating of Perceived Exertion (Exercise)  95  _0 Perceived Dyspnea (Exercise)  _1 0  2   Symptoms  1  -  -  -  -   Duration  Progress to 45 minutes of aerobic exercise without signs/symptoms of physical distress  Progress to 45 minutes of aerobic exercise without signs/symptoms of physical distress  Progress to 45 minutes of aerobic exercise without signs/symptoms of physical distress  Progress to 45 minutes of aerobic exercise without signs/symptoms of physical distress  Progress to 45 minutes of aerobic exercise without signs/symptoms of physical distress   Intensity  Other (comment) 40-80 HRR  THRR unchanged  THRR unchanged  THRR unchanged  THRR unchanged     Progression   Progression  Continue to progress workloads to maintain intensity without signs/symptoms of physical distress.  Continue to progress workloads to maintain intensity without signs/symptoms of physical distress.  Continue to progress workloads to maintain intensity without signs/symptoms of physical distress.  Continue to progress workloads to maintain intensity without signs/symptoms of physical distress.  Continue to progress workloads to maintain intensity without signs/symptoms of physical distress.     Resistance Training   Training Prescription  Yes  Yes  Yes  Yes  Yes   Weight  blue bands  blue bands  blue bands  blue bands  blue bands   Reps  10-15  10-15  10-15  10-15  10-15   Time  -  10 Minutes  10 Minutes  10 Minutes  10 Minutes     Interval Training   Interval Training  -  -  -  -  -     Oxygen   Oxygen  Continuous  Continuous  Continuous  Continuous  Continuous   Liters  2  2-3  2-3  2-3  2-3     NuStep   Level  _0 -  3   Minutes  _1 -  17    METs  2  2  1.7  -  1.9     Arm Ergometer   Level  -  _2 Minutes  -  _3 Track   Laps  _4 Minutes  _5 Row Name 11/30/17 1200 12/05/17 1100 12/12/17 1400 12/14/17 1300       Response to Exercise   Blood Pressure (Admit)  100/80  108/64  134/60  110/70    Blood Pressure (Exercise)  124/70  126/70  150/90  122/62    Blood Pressure (Exit)  114/64  112/78  108/66  90/60 drank H20 recheck 106/60    Heart Rate (Admit)  107 bpm  118 bpm  91 bpm  97 bpm    Heart Rate (Exercise)  135 bpm  133 bpm  126 bpm  108 bpm    Heart Rate (Exit)  110 bpm  110 bpm  96 bpm  97 bpm    Oxygen Saturation (Admit)  88 %  94 %  90 %  97 %    Oxygen Saturation (Exercise)  93 %  93 %  92 %  95 %    Oxygen Saturation (Exit)  92 %  92 %  92 %  91 %    Rating of Perceived Exertion (Exercise)  _6 Perceived Dyspnea (Exercise)  _7 0    Duration  Progress to 45 minutes of aerobic exercise without signs/symptoms of physical distress  Progress to 45 minutes of aerobic exercise without signs/symptoms of physical distress  Progress to 45 minutes of aerobic exercise without signs/symptoms of physical distress  Progress to 45 minutes of aerobic exercise without signs/symptoms of physical distress    Intensity  THRR unchanged  THRR unchanged  THRR unchanged  THRR unchanged      Progression   Progression  Continue to progress workloads to maintain intensity without signs/symptoms of physical distress.  Continue to progress workloads to maintain intensity without signs/symptoms of physical distress.  Continue to progress workloads to maintain intensity without signs/symptoms of physical distress.  Continue to progress workloads to maintain intensity without signs/symptoms of physical distress.      Resistance Training   Training Prescription  Yes  Yes  Yes  Yes    Weight  blue bands  blue bands  blue bands  blue bands    Reps  10-15  10-15  10-15   10-15    Time  10 Minutes  10 Minutes  10 Minutes  10 Minutes  Oxygen   Oxygen  Continuous  Continuous  Continuous  Continuous    Liters  _0 NuStep   Level  _1 SPM  -  80  -  -    Minutes  _2 METs  _3 2.1      Arm Ergometer   Level  -  _4 Minutes  -  _5 Track   Laps  _6 -    Minutes  _7 -      Home Exercise Plan   Plans to continue exercise at  -  -  Home (comment)  -    Frequency  -  -  Add 3 additional days to program exercise sessions.  -       Exercise Comments: Exercise Comments    Row Name 12/12/17 1615           Exercise Comments  Home exercise completed          Exercise Goals and Review:   Exercise Goals Re-Evaluation : Exercise Goals Re-Evaluation    Row Name 11/09/17 1544 11/28/17 0746 12/14/17 0717         Exercise Goal Re-Evaluation   Exercise Goals Review  Increase Physical Activity;Increase Strength and Stamina;Able to understand and use Dyspnea scale;Able to understand and use rate of perceived exertion (RPE) scale;Knowledge and understanding of Target Heart Rate Range (THRR);Understanding of Exercise Prescription  Increase Physical Activity;Increase Strength and Stamina;Able to understand and use Dyspnea scale;Able to understand and use rate of perceived exertion (RPE) scale;Knowledge and understanding of Target Heart Rate Range (THRR);Understanding of Exercise Prescription  Increase Strength and Stamina;Increase Physical Activity;Able to understand and use Dyspnea scale;Able to understand and use rate of perceived exertion (RPE) scale;Knowledge and understanding of Target Heart Rate Range (THRR);Understanding of Exercise Prescription     Comments  Patient has only attended one rehab sessions. Will cont. to monitor and progress as able.   Patient has only attended 4 exercise sessions. We have requested oxygen for home from [place]. Patient shows initiative and  motivation. Will cont. to monitor and progress as able.   Patient shows initiative and motivation. Home exercise has been completed. Has adequate oxygen at home. Weight loss is biggest goal. Will cont. to monitor and progress.     Expected Outcomes  Through exercise at rehab and at home, patient will increase strength and stamina making ADL's easier to perform. Patient will also have a better understanding of safe exercise and what they are capable of doing outside of clinical supervision.   Through exercise at rehab and at home, patient will increase strength and stamina and understand how to safely exercise at home.   Through exercise at rehab and at home, patient will increase strength and stamina making ADL's easier to perform. Patient will also have a better understanding of safe exercise and what they are capable to do outside of clinical supervision.        Discharge Exercise Prescription (Final Exercise Prescription Changes): Exercise Prescription Changes - 12/14/17 1300      Response to Exercise   Blood Pressure (Admit)  110/70    Blood Pressure (Exercise)  122/62    Blood  Pressure (Exit)  90/60 drank H20 recheck 106/60    Heart Rate (Admit)  97 bpm    Heart Rate (Exercise)  108 bpm    Heart Rate (Exit)  97 bpm    Oxygen Saturation (Admit)  97 %    Oxygen Saturation (Exercise)  95 %    Oxygen Saturation (Exit)  91 %    Rating of Perceived Exertion (Exercise)  11    Perceived Dyspnea (Exercise)  0    Duration  Progress to 45 minutes of aerobic exercise without signs/symptoms of physical distress    Intensity  THRR unchanged      Progression   Progression  Continue to progress workloads to maintain intensity without signs/symptoms of physical distress.      Resistance Training   Training Prescription  Yes    Weight  blue bands    Reps  10-15    Time  10 Minutes      Oxygen   Oxygen  Continuous    Liters  4      NuStep   Level  4    Minutes  17    METs  2.1      Arm  Ergometer   Level  4    Minutes  17       Nutrition:  Target Goals: Understanding of nutrition guidelines, daily intake of sodium <151m, cholesterol <2072m calories 30% from fat and 7% or less from saturated fats, daily to have 5 or more servings of fruits and vegetables.  Biometrics: Pre Biometrics - 10/30/17 1319      Pre Biometrics   Grip Strength  31 kg        Nutrition Therapy Plan and Nutrition Goals: Nutrition Therapy & Goals - 11/24/17 1201      Nutrition Therapy   Diet  General, Healthful      Personal Nutrition Goals   Nutrition Goal  Identify food quantities necessary to achieve wt loss of  -2# per week to a goal wt loss of 6-24 lb at graduation from pulmonary rehab.      Intervention Plan   Intervention  Prescribe, educate and counsel regarding individualized specific dietary modifications aiming towards targeted core components such as weight, hypertension, lipid management, diabetes, heart failure and other comorbidities.    Expected Outcomes  Short Term Goal: Understand basic principles of dietary content, such as calories, fat, sodium, cholesterol and nutrients.;Long Term Goal: Adherence to prescribed nutrition plan.       Nutrition Discharge: Rate Your Plate Scores: Nutrition Assessments - 11/24/17 1155      Rate Your Plate Scores   Pre Score  51       Nutrition Goals Re-Evaluation:   Nutrition Goals Discharge (Final Nutrition Goals Re-Evaluation):   Psychosocial: Target Goals: Acknowledge presence or absence of significant depression and/or stress, maximize coping skills, provide positive support system. Participant is able to verbalize types and ability to use techniques and skills needed for reducing stress and depression.  Initial Review & Psychosocial Screening: Initial Psych Review & Screening - 10/30/17 1325      Initial Review   Current issues with  None Identified      Family Dynamics   Good Support System?  Yes      Barriers    Psychosocial barriers to participate in program  There are no identifiable barriers or psychosocial needs.       Quality of Life Scores:   PHQ-9: Recent Review Flowsheet Data    Depression screen  PHQ 2/9 10/30/2017   Decreased Interest 0   Down, Depressed, Hopeless 1   PHQ - 2 Score 1     Interpretation of Total Score  Total Score Depression Severity:  1-4 = Minimal depression, 5-9 = Mild depression, 10-14 = Moderate depression, 15-19 = Moderately severe depression, 20-27 = Severe depression   Psychosocial Evaluation and Intervention: Psychosocial Evaluation - 10/30/17 1325      Psychosocial Evaluation & Interventions   Interventions  Encouraged to exercise with the program and follow exercise prescription    Continue Psychosocial Services   No Follow up required       Psychosocial Re-Evaluation: Psychosocial Re-Evaluation    [place] Name 11/09/17 1615 11/28/17 1256 12/21/17 0716         Psychosocial Re-Evaluation   Current issues with  None Identified  None Identified  None Identified     Expected Outcomes  patient will remain free from psychosocial barriers to participation in pulmonary rehab  patient will remain free from psychosocial barriers to participation in pulmonary rehab  patient will remain free from psychosocial barriers to participation in pulmonary rehab     Continue Psychosocial Services   No Follow up required  No Follow up required  No Follow up required        Psychosocial Discharge (Final Psychosocial Re-Evaluation): Psychosocial Re-Evaluation - 12/21/17 0716      Psychosocial Re-Evaluation   Current issues with  None Identified    Expected Outcomes  patient will remain free from psychosocial barriers to participation in pulmonary rehab    Continue Psychosocial Services   No Follow up required       Education: Education Goals: Education classes will be provided on a weekly basis, covering required topics. Participant will state understanding/return  demonstration of topics presented.  Learning Barriers/Preferences:   Education Topics: Risk Factor Reduction:  -Group instruction that is supported by a PowerPoint presentation. Instructor discusses the definition of a risk factor, different risk factors for pulmonary disease, and how the heart and lungs work together.     Nutrition for Pulmonary Patient:  -Group instruction provided by PowerPoint slides, verbal discussion, and written materials to support subject matter. The instructor gives an explanation and review of healthy diet recommendations, which includes a discussion on weight management, recommendations for fruit and vegetable consumption, as well as protein, fluid, caffeine, fiber, sodium, sugar, and alcohol. Tips for eating when patients are short of breath are discussed.   Pursed Lip Breathing:  -Group instruction that is supported by demonstration and informational handouts. Instructor discusses the benefits of pursed lip and diaphragmatic breathing and detailed demonstration on how to preform both.     Oxygen Safety:  -Group instruction provided by PowerPoint, verbal discussion, and written material to support subject matter. There is an overview of "What is Oxygen" and "Why do we need it".  Instructor also reviews how to create a safe environment for oxygen use, the importance of using oxygen as prescribed, and the risks of noncompliance. There is a brief discussion on traveling with oxygen and resources the patient may utilize.   Oxygen Equipment:  -Group instruction provided by [facility] Staff utilizing handouts, written materials, and equipment demonstrations.   PULMONARY REHAB OTHER RESPIRATORY from 12/14/2017 in [place]  Date  11/23/17  Educator  [employer]  Instruction Review Code  2- meets goals/outcomes      Signs and Symptoms:  -Group instruction provided by written material and verbal discussion to support subject  matter. Warning signs and symptoms of infection, stroke, and heart attack are reviewed and when to call the physician/911 reinforced. Tips for preventing the spread of infection discussed.   Advanced Directives:  -Group instruction provided by verbal instruction and written material to support subject matter. Instructor reviews Advanced Directive laws and proper instruction for filling out document.   Pulmonary Video:  -Group video education that reviews the importance of medication and oxygen compliance, exercise, good nutrition, pulmonary hygiene, and pursed lip and diaphragmatic breathing for the pulmonary patient.   Exercise for the Pulmonary Patient:  -Group instruction that is supported by a PowerPoint presentation. Instructor discusses benefits of exercise, core components of exercise, frequency, duration, and intensity of an exercise routine, importance of utilizing pulse oximetry during exercise, safety while exercising, and options of places to exercise outside of rehab.     Pulmonary Medications:  -Verbally interactive group education provided by instructor with focus on inhaled medications and proper administration.   Anatomy and Physiology of the Respiratory System and Intimacy:  -Group instruction provided by PowerPoint, verbal discussion, and written material to support subject matter. Instructor reviews respiratory cycle and anatomical components of the respiratory system and their functions. Instructor also reviews differences in obstructive and restrictive respiratory diseases with examples of each. Intimacy, Sex, and Sexuality differences are reviewed with a discussion on how relationships can change when diagnosed with pulmonary disease. Common sexual concerns are reviewed.   PULMONARY REHAB OTHER RESPIRATORY from 12/14/2017 in [place]  Date  12/14/17  Educator  RN  Instruction Review Code  2- meets goals/outcomes      MD DAY -A  group question and answer session with a medical doctor that allows participants to ask questions that relate to their pulmonary disease state.   OTHER EDUCATION -Group or individual verbal, written, or video instructions that support the educational goals of the pulmonary rehab program.   Knowledge Questionnaire Score: Knowledge Questionnaire Score - 11/09/17 1423      Knowledge Questionnaire Score   Pre Score  15/18       Core Components/Risk Factors/Patient Goals at Admission: Personal Goals and Risk Factors at Admission - 10/30/17 1322      Core Components/Risk Factors/Patient Goals on Admission   Improve shortness of breath with ADL's  Yes    Intervention  Provide education, individualized exercise plan and daily activity instruction to help decrease symptoms of SOB with activities of daily living.    Expected Outcomes  Short Term: Achieves a reduction of symptoms when performing activities of daily living.    Develop more efficient breathing techniques such as purse lipped breathing and diaphragmatic breathing; and practicing self-pacing with activity  Yes    Intervention  Provide education, demonstration and support about specific breathing techniuqes utilized for more efficient breathing. Include techniques such as pursed lipped breathing, diaphragmatic breathing and self-pacing activity.    Expected Outcomes  Short Term: Participant will be able to demonstrate and use breathing techniques as needed throughout daily activities.    Increase knowledge of respiratory medications and ability to use respiratory devices properly   Yes    Intervention  Provide education and demonstration as needed of appropriate use of medications, inhalers, and oxygen therapy.    Expected Outcomes  Short Term: Achieves understanding of medications use. Understands that oxygen is a medication prescribed by physician. Demonstrates appropriate use of inhaler and oxygen therapy.       Core  Components/Risk Factors/Patient Goals Review:  Goals and  Risk Factor Review    Row Name 11/09/17 1614 11/28/17 1254 11/28/17 1256 12/21/17 0714 12/21/17 0716     Core Components/Risk Factors/Patient Goals Review   Personal Goals Review  Improve shortness of breath with ADL's;Develop more efficient breathing techniques such as purse lipped breathing and diaphragmatic breathing and practicing self-pacing with activity.;Increase knowledge of respiratory medications and ability to use respiratory devices properly.  Improve shortness of breath with ADL's;Develop more efficient breathing techniques such as purse lipped breathing and diaphragmatic breathing and practicing self-pacing with activity.;Increase knowledge of respiratory medications and ability to use respiratory devices properly.  -  Improve shortness of breath with ADL's;Develop more efficient breathing techniques such as purse lipped breathing and diaphragmatic breathing and practicing self-pacing with activity.;Increase knowledge of respiratory medications and ability to use respiratory devices properly.  -   Review  patient has only attended 1 exercise session since admission and too soon to evaluate progress towards goals  patient has attended 5 session since admission and states she is seeing an improvement in her stamina and strenght during exertion in pulm rehab. She feels this is related to oxygen use. She has not recieved home oxygen as of today. She is awaiting delivery. She feels once she has oxygen for home exertional activities she will see an improvement in her shortness of breath. She still need coaching in PLB.  -  patient is progressing well in pulmonary rehab. She is continueing to see an improvement in her stamina and strength especially now that she has home O2. She is observed using PLB more effeciently and has to be reminded less while exercising.  -   Expected Outcomes  -  -  see admission outcomes  -  see admission outcomes       Core Components/Risk Factors/Patient Goals at Discharge (Final Review):  Goals and Risk Factor Review - 12/21/17 0716      Core Components/Risk Factors/Patient Goals Review   Expected Outcomes  see admission outcomes       ITP Comments:   Comments: Patient has attended 9 sessions since admission to pulmonary rehab.

## 2017-12-21 NOTE — Progress Notes (Signed)
Daily Session Note  Patient Details  Name: Patty [name] MRN: [id number] Date of Birth: 1951-08-19 Referring Provider:     Pulmonary Rehab Walk Test from 11/02/2017 in [place]  Referring Provider  Dr.Ramaswamy      Encounter Date: 12/21/2017  Check In: Session Check In - 12/21/17 1022      Check-In   Location  [facility]    Staff Present  Rosebud Poles, RN, BSN;Lisa Ysidro Evert, RN;Neleh Muldoon Rollene Rotunda, RN, BSN    Supervising physician immediately available to respond to emergencies  [facility] immediately available    Physician(s)  Dr. Rockne Menghini    Medication changes reported      No    Fall or balance concerns reported     No    Tobacco Cessation  No Change    Warm-up and Cool-down  Performed as group-led instruction    Resistance Training Performed  Yes    VAD Patient?  No      Pain Assessment   Currently in Pain?  No/denies    Multiple Pain Sites  No       Capillary Blood Glucose: No results found for this or any previous visit (from the past 24 hour(s)).  Exercise Prescription Changes - 12/21/17 1225      Response to Exercise   Blood Pressure (Admit)  104/64    Blood Pressure (Exercise)  136/80    Blood Pressure (Exit)  98/64    Heart Rate (Admit)  93 bpm    Heart Rate (Exercise)  127 bpm    Heart Rate (Exit)  108 bpm    Oxygen Saturation (Admit)  94 %    Oxygen Saturation (Exercise)  91 %    Oxygen Saturation (Exit)  94 %    Rating of Perceived Exertion (Exercise)  15    Perceived Dyspnea (Exercise)  3    Duration  Progress to 45 minutes of aerobic exercise without signs/symptoms of physical distress    Intensity  THRR unchanged      Progression   Progression  Continue to progress workloads to maintain intensity without signs/symptoms of physical distress.      Resistance Training   Training Prescription  Yes    Weight  blue bands    Reps  10-15    Time  10 Minutes      Oxygen   Oxygen  Continuous    Liters   2-4      NuStep   Level  5    Minutes  17    METs  2.2      Arm Ergometer   Level  4    Minutes  17      Track   Laps  12    Minutes  17       Social History   Tobacco Use  Smoking Status Former Smoker  . Packs/day: 1.00  . Years: 45.00  . Pack years: 45.00  . Types: Cigarettes  . Last attempt to quit: 12/26/2008  . Years since quitting: 8.9  Smokeless Tobacco Never Used    Goals Met:  Improved SOB with ADL's Using PLB without cueing & demonstrates good technique Exercise tolerated well No report of cardiac concerns or symptoms Strength training completed today  Goals Unmet:  Not Applicable  Comments: Service time is from 1030 to 1215   Dr. Rush Farmer is Medical Director for Pulmonary Rehab at [facility].

## 2017-12-25 DIAGNOSIS — H5213 Myopia, bilateral: Secondary | ICD-10-CM | POA: Diagnosis not present

## 2017-12-25 DIAGNOSIS — H353131 Nonexudative age-related macular degeneration, bilateral, early dry stage: Secondary | ICD-10-CM | POA: Diagnosis not present

## 2017-12-25 DIAGNOSIS — H2011 Chronic iridocyclitis, right eye: Secondary | ICD-10-CM | POA: Diagnosis not present

## 2017-12-25 DIAGNOSIS — H2513 Age-related nuclear cataract, bilateral: Secondary | ICD-10-CM | POA: Diagnosis not present

## 2017-12-28 ENCOUNTER — Encounter (HOSPITAL_COMMUNITY)
Admission: RE | Admit: 2017-12-28 | Discharge: 2017-12-28 | Disposition: A | Discharge status: Home / Self Care | Payer: Medicare Other | Source: Ambulatory Visit | Attending: Internal Medicine | Admitting: Internal Medicine

## 2017-12-28 VITALS — Wt 258.6 lb

## 2017-12-28 DIAGNOSIS — J849 Interstitial pulmonary disease, unspecified: Secondary | ICD-10-CM | POA: Insufficient documentation

## 2017-12-28 DIAGNOSIS — Z9889 Other specified postprocedural states: Secondary | ICD-10-CM

## 2017-12-28 NOTE — Progress Notes (Signed)
Daily Session Note  Patient Details  Name: Patty [name] MRN: [id number] Date of Birth: Aug 27, 1951 Referring Provider:     Pulmonary Rehab Walk Test from 11/02/2017 in [place]  Referring Provider  Dr.Ramaswamy      Encounter Date: 12/28/2017  Check In: Session Check In - 12/28/17 1116      Check-In   Location  [facility]    Staff Present  Trish Fountain, RN, BSN;Lisa Ysidro Evert, RN;Joan [place], RN, BSN;Shenequa Howse, MS, ACSM RCEP, Exercise Physiologist    Supervising physician immediately available to respond to emergencies  [facility] immediately available    Physician(s)  Dr. Eliseo Squires    Medication changes reported      No    Fall or balance concerns reported     No    Tobacco Cessation  No Change    Warm-up and Cool-down  Performed as group-led instruction    Resistance Training Performed  Yes    VAD Patient?  No      Pain Assessment   Currently in Pain?  No/denies    Multiple Pain Sites  No       Capillary Blood Glucose: No results found for this or any previous visit (from the past 24 hour(s)).    Social History   Tobacco Use  Smoking Status Former Smoker  . Packs/day: 1.00  . Years: 45.00  . Pack years: 45.00  . Types: Cigarettes  . Last attempt to quit: 12/26/2008  . Years since quitting: 9.0  Smokeless Tobacco Never Used    Goals Met:  Exercise tolerated well No report of cardiac concerns or symptoms Strength training completed today  Goals Unmet:  Not Applicable  Comments: 48:18H to 12:30p   Dr. Rush Farmer is Medical Director for Pulmonary Rehab at [facility].

## 2018-01-02 ENCOUNTER — Encounter (HOSPITAL_COMMUNITY)
Admission: RE | Admit: 2018-01-02 | Discharge: 2018-01-02 | Disposition: A | Discharge status: Home / Self Care | Payer: Medicare Other | Source: Ambulatory Visit | Attending: Internal Medicine | Admitting: Internal Medicine

## 2018-01-02 VITALS — Wt 254.9 lb

## 2018-01-02 DIAGNOSIS — D649 Anemia, unspecified: Secondary | ICD-10-CM | POA: Diagnosis not present

## 2018-01-02 DIAGNOSIS — J849 Interstitial pulmonary disease, unspecified: Secondary | ICD-10-CM | POA: Diagnosis not present

## 2018-01-02 NOTE — Progress Notes (Signed)
Daily Session Note  Patient Details  Name: Patty [name] MRN: [id number] Date of Birth: 12-23-1951 Referring Provider:     Pulmonary Rehab Walk Test from 11/02/2017 in [place]  Referring Provider  Dr.Ramaswamy      Encounter Date: 01/02/2018  Check In: Session Check In - 01/02/18 1030      Check-In   Location  [facility]    Staff Present  Rosebud Poles, RN, Luisa Hart, RN, BSN;Molly diVincenzo, MS, ACSM RCEP, Exercise Physiologist;Amanii Snethen Ysidro Evert, RN    Supervising physician immediately available to respond to emergencies  [facility] immediately available    Physician(s)  Dr. Eliseo Squires    Medication changes reported      No    Fall or balance concerns reported     No    Tobacco Cessation  No Change    Warm-up and Cool-down  Performed as group-led instruction    Resistance Training Performed  Yes    VAD Patient?  No      Pain Assessment   Currently in Pain?  No/denies    Multiple Pain Sites  No       Capillary Blood Glucose: No results found for this or any previous visit (from the past 24 hour(s)).  Exercise Prescription Changes - 01/02/18 1200      Response to Exercise   Blood Pressure (Admit)  110/62    Blood Pressure (Exercise)  136/90    Blood Pressure (Exit)  98/60    Heart Rate (Admit)  103 bpm    Heart Rate (Exercise)  13 bpm    Heart Rate (Exit)  106 bpm    Oxygen Saturation (Admit)  90 %    Oxygen Saturation (Exercise)  93 %    Oxygen Saturation (Exit)  94 %    Rating of Perceived Exertion (Exercise)  15    Perceived Dyspnea (Exercise)  3.5    Duration  Progress to 45 minutes of aerobic exercise without signs/symptoms of physical distress    Intensity  THRR unchanged      Progression   Progression  Continue to progress workloads to maintain intensity without signs/symptoms of physical distress.      Resistance Training   Training Prescription  Yes    Weight  blue bands    Reps  10-15    Time  10  Minutes      Oxygen   Oxygen  Continuous    Liters  2.4      NuStep   Level  5    Minutes  17    METs  2.1      Arm Ergometer   Level  4    Minutes  17      Track   Laps  10    Minutes  17       Social History   Tobacco Use  Smoking Status Former Smoker  . Packs/day: 1.00  . Years: 45.00  . Pack years: 45.00  . Types: Cigarettes  . Last attempt to quit: 12/26/2008  . Years since quitting: 9.0  Smokeless Tobacco Never Used    Goals Met:  Exercise tolerated well No report of cardiac concerns or symptoms Strength training completed today  Goals Unmet:  Not Applicable  Comments: Service time is from 1030 to 1205    Dr. Rush Farmer is Medical Director for Pulmonary Rehab at [facility].

## 2018-01-04 ENCOUNTER — Encounter (HOSPITAL_COMMUNITY)
Admission: RE | Admit: 2018-01-04 | Discharge: 2018-01-04 | Disposition: A | Discharge status: Home / Self Care | Payer: Medicare Other | Source: Ambulatory Visit | Attending: Internal Medicine | Admitting: Internal Medicine

## 2018-01-04 VITALS — Wt 254.6 lb

## 2018-01-04 DIAGNOSIS — J849 Interstitial pulmonary disease, unspecified: Secondary | ICD-10-CM

## 2018-01-04 DIAGNOSIS — Z9889 Other specified postprocedural states: Secondary | ICD-10-CM

## 2018-01-04 NOTE — Progress Notes (Signed)
Daily Session Note  Patient Details  Name: Ahley Bulls MRN: [id number] Date of Birth: January 26, 1951 Referring Provider:     Pulmonary Rehab Walk Test from 11/02/2017 in [place]  Referring Provider  Dr.Ramaswamy      Encounter Date: 01/04/2018  Check In: Session Check In - 01/04/18 1101      Check-In   Location  [facility]    Staff Present  Trish Fountain, RN, BSN;Maryn Freelove Ysidro Evert, RN;Molly diVincenzo, MS, ACSM RCEP, Exercise Physiologist;Joan [place] Reeves, RN, BSN    Supervising physician immediately available to respond to emergencies  [facility] immediately available    Physician(s)  [place]    Medication changes reported      No    Fall or balance concerns reported     No    Tobacco Cessation  No Change    Warm-up and Cool-down  Performed as group-led Higher education careers adviser Performed  Yes    VAD Patient?  No      Pain Assessment   Currently in Pain?  No/denies    Multiple Pain Sites  No       Capillary Blood Glucose: No results found for this or any previous visit (from the past 24 hour(s)).  Exercise Prescription Changes - 01/04/18 1200      Response to Exercise   Blood Pressure (Admit)  130/80    Blood Pressure (Exercise)  -- RD consult    Blood Pressure (Exit)  104/64    Heart Rate (Admit)  95 bpm    Heart Rate (Exit)  81 bpm    Oxygen Saturation (Admit)  93 %    Oxygen Saturation (Exit)  93 %    Duration  Progress to 45 minutes of aerobic exercise without signs/symptoms of physical distress    Intensity  THRR unchanged      Progression   Progression  Continue to progress workloads to maintain intensity without signs/symptoms of physical distress.      Resistance Training   Training Prescription  Yes    Weight  blue bands    Reps  10-15    Time  10 Minutes      Oxygen   Oxygen  Continuous    Liters  2-4       Social History   Tobacco Use  Smoking Status Former Smoker  . Packs/day: 1.00   . Years: 45.00  . Pack years: 45.00  . Types: Cigarettes  . Last attempt to quit: 12/26/2008  . Years since quitting: 9.0  Smokeless Tobacco Never Used    Goals Met:  Exercise tolerated well No report of cardiac concerns or symptoms Strength training completed today  Goals Unmet:  Not Applicable  Comments: Service time is from 1030 to 1230    Dr. Rush Farmer is Medical Director for Pulmonary Rehab at [facility].

## 2018-01-04 NOTE — Progress Notes (Signed)
Patty [name] 67 y.o. female   DOB: 03/20/1951 MRN: [id number]          Nutrition 1. S/P pulmonary valve repair   2. Interstitial pulmonary disease ([place])    Note Spoke with pt. Pt is obese. Pt reports a history of 60 lb weight gain before several medical dx. Per discussion, pt has a h/o trying multiple wt loss programs including calorie counting (1200 kcal or less/d), Weight Watcher's and a keto-type diet. Pt states she has a h/o pre-diabetes and family h/o DM. Wt loss resources discussed. Pt is taking multiple supplements "because I usually think I have a deficiency in something first because of my bowel resection/Crohn's." Pt is interested in an integrative approach to her medical dx. Pt is making healthy food choices the majority of the time.  Pt's Rate Your Plate results reviewed with pt. Pt expressed understanding of the information reviewed via feedback method.    Nutrition Diagnosis ? Food-and nutrition-related knowledge deficit related to lack of exposure to information as related to diagnosis of pulmonary disease ? Obesity related to excessive energy intake as evidenced by a BMI of 44.2  Goal(s) 1. Identify food quantities necessary to achieve wt loss of  -2# per week to a goal wt loss of 6-24 lb at graduation from pulmonary rehab.  Nutrition Intervention ? Pt's individual nutrition plan and goals reviewed with pt. ? Benefits of adopting healthy eating habits discussed when pt's Rate Your Plate reviewed. ? Pt given information for Dennard Nip, MD (Bariatrics and Wt loss) and [place]  ? Pt to attend the Nutrition and Lung Disease class  Plan:  Pt to attend Pulmonary Nutrition class - met 12/28/17 Will provide client-centered nutrition education as part of interdisciplinary care.   Monitor and evaluate progress toward nutrition goal with team.  Monitor and Evaluate progress toward nutrition goal with team.   Derek Mound, M.Ed, RD, LDN, CDE 01/04/2018 12:24  PM

## 2018-01-09 ENCOUNTER — Encounter (HOSPITAL_COMMUNITY)
Admission: RE | Admit: 2018-01-09 | Discharge: 2018-01-09 | Disposition: A | Discharge status: Home / Self Care | Payer: Medicare Other | Source: Ambulatory Visit | Attending: Internal Medicine | Admitting: Internal Medicine

## 2018-01-09 VITALS — Wt 261.5 lb

## 2018-01-09 DIAGNOSIS — J849 Interstitial pulmonary disease, unspecified: Secondary | ICD-10-CM

## 2018-01-09 NOTE — Progress Notes (Signed)
Daily Session Note  Patient Details  Name: Patty [name] MRN: [id number] Date of Birth: 05-Jun-1951 Referring Provider:     Pulmonary Rehab Walk Test from 11/02/2017 in [place]  Referring Provider  Dr.Ramaswamy      Encounter Date: 01/09/2018  Check In: Session Check In - 01/09/18 1030      Check-In   Location  [facility]    Staff Present  Rosebud Poles, RN, Luisa Hart, RN, BSN;Havanna Groner Ysidro Evert, RN;Molly diVincenzo, MS, ACSM RCEP, Exercise Physiologist    Supervising physician immediately available to respond to emergencies  [facility] immediately available    Physician(s)  Dr. Eliseo Squires    Medication changes reported      No    Fall or balance concerns reported     No    Tobacco Cessation  No Change    Warm-up and Cool-down  Performed as group-led instruction    Resistance Training Performed  Yes    VAD Patient?  No      Pain Assessment   Currently in Pain?  No/denies    Multiple Pain Sites  No       Capillary Blood Glucose: No results found for this or any previous visit (from the past 24 hour(s)).  Exercise Prescription Changes - 01/09/18 1200      Response to Exercise   Blood Pressure (Admit)  100/64    Blood Pressure (Exercise)  138/80    Blood Pressure (Exit)  120/70    Heart Rate (Admit)  101 bpm    Heart Rate (Exercise)  151 bpm HR up on track, encouraged rest stops sooner    Heart Rate (Exit)  108 bpm    Oxygen Saturation (Admit)  94 %    Oxygen Saturation (Exercise)  94 %    Oxygen Saturation (Exit)  91 %    Rating of Perceived Exertion (Exercise)  15    Perceived Dyspnea (Exercise)  3    Duration  Progress to 45 minutes of aerobic exercise without signs/symptoms of physical distress    Intensity  THRR unchanged      Progression   Progression  Continue to progress workloads to maintain intensity without signs/symptoms of physical distress.      Resistance Training   Training Prescription  Yes    Weight  blue bands    Reps  10-15    Time  10 Minutes      Oxygen   Oxygen  Continuous    Liters  2.4      NuStep   Level  6    Minutes  17    METs  2.3      Arm Ergometer   Level  4    Minutes  17      Track   Laps  8    Minutes  17       Social History   Tobacco Use  Smoking Status Former Smoker  . Packs/day: 1.00  . Years: 45.00  . Pack years: 45.00  . Types: Cigarettes  . Last attempt to quit: 12/26/2008  . Years since quitting: 9.0  Smokeless Tobacco Never Used    Goals Met:  Exercise tolerated well No report of cardiac concerns or symptoms Strength training completed today  Goals Unmet:  Not Applicable  Comments: Service time is from 1030 to 1200    Dr. Rush Farmer is Medical Director for Pulmonary Rehab at [facility].

## 2018-01-11 ENCOUNTER — Encounter: Payer: Self-pay | Admitting: Adult Health

## 2018-01-11 ENCOUNTER — Telehealth: Payer: Self-pay | Admitting: Adult Health

## 2018-01-11 ENCOUNTER — Encounter (HOSPITAL_COMMUNITY): Payer: Medicare Other

## 2018-01-11 ENCOUNTER — Telehealth: Payer: Self-pay | Admitting: Internal Medicine

## 2018-01-11 NOTE — Telephone Encounter (Signed)
Will send a message back to pt through [place].

## 2018-01-11 NOTE — Telephone Encounter (Signed)
Pt message :  My symptoms are mostly congestion in nose, ears, and head with increased nasal drip this morning, some body aches and chills last night. There is no chest pain, shortness of breath, or fever. I currently do not have a cough but with the nasal drip more evident today, I have minor coughing with no mucus right now.     Would begin Mucinex DM As needed  , use flonase and zyrtec As needed   Fluids and rest . Tylenol As needed      Zpack #1 take as directed. To have on hold If symptoms persist with discolored mucus  Please contact office for sooner follow up if symptoms do not improve or worsen or seek emergency care ]  Ov if she needs.

## 2018-01-12 NOTE — Telephone Encounter (Signed)
Duplicate message. 

## 2018-01-15 ENCOUNTER — Other Ambulatory Visit: Payer: Self-pay

## 2018-01-15 MED ORDER — AMOXICILLIN-POT CLAVULANATE 875-125 MG PO TABS: 1.0000 | ORAL_TABLET | Freq: Two times a day (BID) | ORAL | 0 refills | Status: DC

## 2018-01-15 NOTE — Telephone Encounter (Signed)
On 01/11/18 pt was offered zpak. Per pt she does not tolerate zpak well.  Below I have copied pt's response.  TP please advise. Thanks.    [name]  to [place], Fonnie Mu, NP       5:39 PM  When I took the Zpak, I had almost all of the side effects and it irritated my Crohns. I have never taken many antibiotics and when I have had a sinus infection augmentin has been effective.

## 2018-01-15 NOTE — Telephone Encounter (Signed)
Per TP: okay for Augmentin 838m #14, 1po BID for a week.  Please make sure that she did NOT get the zpak.  Sorry that she had to wait over the weekend for recommendations.  Thanks.

## 2018-01-16 ENCOUNTER — Encounter (HOSPITAL_COMMUNITY)
Admission: RE | Admit: 2018-01-16 | Discharge: 2018-01-16 | Disposition: A | Discharge status: Home / Self Care | Payer: Medicare Other | Source: Ambulatory Visit

## 2018-01-16 DIAGNOSIS — J849 Interstitial pulmonary disease, unspecified: Secondary | ICD-10-CM

## 2018-01-16 NOTE — Progress Notes (Signed)
Pulmonary Individual Treatment Plan  Patient Details  Name: Patty [name] MRN: [id number] Date of Birth: [birth year] Referring Provider:     Pulmonary Rehab Walk Test from 11/02/2017 in [facility]  Referring Provider  Dr.Ramaswamy      Initial Encounter Date:    Pulmonary Rehab Walk Test from 11/02/2017 in [facility]  Date  11/02/17  Referring Provider  Dr.Ramaswamy      Visit Diagnosis: Interstitial pulmonary disease ([place])  Patient's Home Medications on Admission:   Current Outpatient Medications:  .  amoxicillin-clavulanate (AUGMENTIN) 875-125 MG tablet, Take 1 tablet by mouth 2 (two) times daily., Disp: 14 tablet, Rfl: 0 .  aspirin EC 81 MG tablet, Take 81 mg by mouth daily., Disp: , Rfl:  .  B Complex-C (B-COMPLEX WITH VITAMIN C) tablet, Take 1 tablet by mouth daily., Disp: , Rfl:  .  Biotin 5000 MCG SUBL, Place 5,000 mcg under the tongue daily., Disp: , Rfl:  .  Cetirizine HCl (ZYRTEC ALLERGY) 10 MG CAPS, TAKE 1 TABLET DAILY ALONG WITH THE LASIX, Disp: 90 capsule, Rfl: 3 .  Cetirizine-Pseudoephedrine (ZYRTEC-D PO), Take 0.5-1 tablets by mouth daily as needed (for sinus congestion/allergies.). , Disp: , Rfl:  .  Cholecalciferol (VITAMIN D) 2000 units tablet, Take 2,000 Units by mouth daily., Disp: , Rfl:  .  Chromium 500 MCG TABS, Take 500 mcg by mouth every 3 (three) days., Disp: , Rfl:  .  clonazePAM (KLONOPIN) 0.5 MG tablet, Take 0.125-0.25 mg by mouth daily as needed (for anxiety or sleep.)., Disp: , Rfl:  .  cyanocobalamin (,VITAMIN B-12,) 1000 MCG/ML injection, Inject 1,000 mcg into the muscle every 30 (thirty) days. Pernicious anemia, Disp: , Rfl:  .  Cyanocobalamin (VITAMIN B-12) 5000 MCG SUBL, Place 5,000 mcg under the tongue daily., Disp: , Rfl:  .  cyclopentolate (CYCLODRYL,CYCLOGYL) 2 % ophthalmic solution, Place 1 drop into both eyes 3 (three) times daily as needed (for uveitis or iritis)., Disp: , Rfl:   .  fluticasone (FLONASE) 50 MCG/ACT nasal spray, , Disp: , Rfl:  .  furosemide (LASIX) 20 MG tablet, Take 1 tablet (20 mg total) by mouth daily., Disp: 90 tablet, Rfl: 3 .  Magnesium 250 MG TABS, Take 250 mg by mouth every other day., Disp: , Rfl:  .  Misc Natural Products (FOCUSED MIND PO), Take 1 tablet by mouth every 3 (three) days. FOCUS FACTOR NUTRITIONAL SUPPLEMENT, Disp: , Rfl:  .  Multiple Vitamins-Minerals (ICAPS AREDS 2 PO), Take 1 tablet by mouth every 3 (three) days. , Disp: , Rfl:  .  Niacinamide-Zinc-Copper-FA (NICOTINAMIDE ZCF PO), Take 1 capsule by mouth daily. Nicotinamide Riboside-Pterostilbene 250-50 mg ([employer]), Disp: , Rfl:  .  OVER THE COUNTER MEDICATION, Place 1 each under the tongue daily. 3-4 DROP DAILY (CBD OIL), Disp: , Rfl:  .  potassium chloride (MICRO-K) 10 MEQ CR capsule, Take 1 capsule (10 mEq total) by mouth daily., Disp: 90 capsule, Rfl: 3 .  prednisoLONE acetate (PRED FORTE) 1 % ophthalmic suspension, Place 1 drop into both eyes 4 (four) times daily as needed (for iritis flare ups (inflammation))., Disp: , Rfl:  .  Probiotic Product (PROBIOTIC PO), Take 1 capsule by mouth once a week., Disp: , Rfl:  .  traMADol-acetaminophen (ULTRACET) 37.5-325 MG tablet, Take 0.5-1 tablets by mouth every 6 (six) hours as needed (for pain.)., Disp: , Rfl:  .  traZODone (DESYREL) 50 MG tablet, Take 25-50 mg by mouth  at bedtime as needed for sleep., Disp: , Rfl:  .  triprolidine-pseudoephedrine (WAL-ACT) 2.5-60 MG TABS tablet, Take 1 tablet by mouth every 6 (six) hours as needed for allergies., Disp: , Rfl:  .  TURMERIC PO, Take 1 capsule by mouth every other day. CURAMIN, Disp: , Rfl:   Past Medical History: Past Medical History:  Diagnosis Date  . Abnormal nuclear stress test    a. 07/2017: cath with no CAD  . Anemia   . Crohn disease ([place])   . OSA on CPAP   . Polycythemia vera ([place])     Tobacco Use: Social History   Tobacco Use  Smoking  Status Former Smoker  . Packs/day: 1.00  . Years: 45.00  . Pack years: 45.00  . Types: Cigarettes  . Last attempt to quit: 12/26/2008  . Years since quitting: 9.0  Smokeless Tobacco Never Used    Labs: Recent Review Flowsheet Data    There is no flowsheet data to display.      Capillary Blood Glucose: No results found for: GLUCAP   Pulmonary Assessment Scores: Pulmonary Assessment Scores    Row Name 11/02/17 1638 11/09/17 1424       ADL UCSD   ADL Phase  Entry  Entry    SOB Score total  -  22      CAT Score   CAT Score  -  7 Entry      mMRC Score   mMRC Score  2  -       Pulmonary Function Assessment: Pulmonary Function Assessment - 10/30/17 1313      Breath   Bilateral Breath Sounds  Clear    Shortness of Breath  Limiting activity;Yes       Exercise Target Goals:    Exercise Program Goal: Individual exercise prescription set using results from initial 6 min walk test and THRR while considering  patient's activity barriers and safety.    Exercise Prescription Goal: Initial exercise prescription builds to 30-45 minutes a day of aerobic activity, 2-3 days per week.  Home exercise guidelines will be given to patient during program as part of exercise prescription that the participant will acknowledge.  Activity Barriers & Risk Stratification: Activity Barriers & Cardiac Risk Stratification - 10/30/17 1319      Activity Barriers & Cardiac Risk Stratification   Activity Barriers  Right Hip Replacement;Deconditioning;Muscular Weakness;Shortness of Breath       6 Minute Walk: 6 Minute Walk    Row Name 11/02/17 1635         6 Minute Walk   Phase  Initial     Distance  1000 feet     Walk Time  - 5 minutes 20 seconds     # of Rest Breaks  1 40 seconds     MPH  1.89     METS  2.45     RPE  13     Perceived Dyspnea   3     Symptoms  Yes (comment)     Comments  4/10 right hip pain     Resting HR  93 bpm     Resting BP  134/85     Resting Oxygen  Saturation   93 %     Exercise Oxygen Saturation  during 6 min walk  83 %     Max Ex. HR  154 bpm     Max Ex. BP  179/98     2 Minute Post BP  136/83  Interval HR   1 Minute HR  115     2 Minute HR  127     3 Minute HR  143     4 Minute HR  120     5 Minute HR  138     6 Minute HR  154     2 Minute Post HR  117     Interval Heart Rate?  Yes       Interval Oxygen   Interval Oxygen?  Yes     Baseline Oxygen Saturation %  93 %     1 Minute Oxygen Saturation %  98 %     1 Minute Liters of Oxygen  0 L     2 Minute Oxygen Saturation %  86 %     2 Minute Liters of Oxygen  0 L     3 Minute Oxygen Saturation %  84 %     3 Minute Liters of Oxygen  0 L     4 Minute Oxygen Saturation %  90 % rest break     4 Minute Liters of Oxygen  0 L     5 Minute Oxygen Saturation %  88 %     5 Minute Liters of Oxygen  0 L     6 Minute Oxygen Saturation %  83 %     6 Minute Liters of Oxygen  0 L     2 Minute Post Oxygen Saturation %  96 %     2 Minute Post Liters of Oxygen  0 L        Oxygen Initial Assessment: Oxygen Initial Assessment - 11/02/17 1639      Initial 6 min Walk   Oxygen Used  None      Program Oxygen Prescription   Program Oxygen Prescription  Continuous;E-Tanks    Liters per minute  2    Comments  Patient desaturated to 83% on 6MWT. Has never had oxygen prescribed to her before. Will re-evaluate her first day of rehab to figure out her supplemental oxygen needs       Oxygen Re-Evaluation: Oxygen Re-Evaluation    Row Name 11/09/17 1611 11/28/17 1252 12/21/17 0713 01/15/18 1547       Program Oxygen Prescription   Program Oxygen Prescription  Continuous;E-Tanks  Continuous;E-Tanks  Continuous;E-Tanks  Continuous;E-Tanks    Liters per minute  _0 2-4    Comments  patient desaturated to 84% while walking track and was placed on 2 liters of O2 for exercise.  -  patient desaturated to 84% while walking track and was placed on 2 liters of O2 for exercise.  -       Home Oxygen   Home Oxygen Device  -  None patient has completed evaluation for home o2 and awaiting delivery  None  Portable Concentrator    Sleep Oxygen Prescription  -  None  None  CPAP    Home Exercise Oxygen Prescription  -  None  None  Continuous    Liters per minute  -  -  -  - 2-4    Home at Rest Exercise Oxygen Prescription  -  None  None  None    Compliance with Home Oxygen Use  -  -  -  Yes      Goals/Expected Outcomes   Short Term Goals  To learn and exhibit compliance with exercise, home and travel O2 prescription;To learn and understand importance of  monitoring SPO2 with pulse oximeter and demonstrate accurate use of the pulse oximeter.;To learn and understand importance of maintaining oxygen saturations>88%;To learn and demonstrate proper pursed lip breathing techniques or other breathing techniques.;To learn and demonstrate proper use of respiratory medications  To learn and exhibit compliance with exercise, home and travel O2 prescription;To learn and understand importance of monitoring SPO2 with pulse oximeter and demonstrate accurate use of the pulse oximeter.;To learn and understand importance of maintaining oxygen saturations>88%;To learn and demonstrate proper pursed lip breathing techniques or other breathing techniques.;To learn and demonstrate proper use of respiratory medications  To learn and exhibit compliance with exercise, home and travel O2 prescription;To learn and understand importance of monitoring SPO2 with pulse oximeter and demonstrate accurate use of the pulse oximeter.;To learn and understand importance of maintaining oxygen saturations>88%;To learn and demonstrate proper pursed lip breathing techniques or other breathing techniques.;To learn and demonstrate proper use of respiratory medications  To learn and exhibit compliance with exercise, home and travel O2 prescription;To learn and understand importance of monitoring SPO2 with pulse oximeter and demonstrate  accurate use of the pulse oximeter.;To learn and understand importance of maintaining oxygen saturations>88%;To learn and demonstrate proper pursed lip breathing techniques or other breathing techniques.;To learn and demonstrate proper use of respiratory medications    Long  Term Goals  Exhibits compliance with exercise, home and travel O2 prescription;Verbalizes importance of monitoring SPO2 with pulse oximeter and return demonstration;Maintenance of O2 saturations>88%;Exhibits proper breathing techniques, such as pursed lip breathing or other method taught during program session;Compliance with respiratory medication;Demonstrates proper use of MDI's  Exhibits compliance with exercise, home and travel O2 prescription;Verbalizes importance of monitoring SPO2 with pulse oximeter and return demonstration;Maintenance of O2 saturations>88%;Exhibits proper breathing techniques, such as pursed lip breathing or other method taught during program session;Compliance with respiratory medication;Demonstrates proper use of MDI's  Exhibits compliance with exercise, home and travel O2 prescription;Verbalizes importance of monitoring SPO2 with pulse oximeter and return demonstration;Maintenance of O2 saturations>88%;Exhibits proper breathing techniques, such as pursed lip breathing or other method taught during program session;Compliance with respiratory medication;Demonstrates proper use of MDI's  Exhibits compliance with exercise, home and travel O2 prescription;Verbalizes importance of monitoring SPO2 with pulse oximeter and return demonstration;Maintenance of O2 saturations>88%;Exhibits proper breathing techniques, such as pursed lip breathing or other method taught during program session;Compliance with respiratory medication;Demonstrates proper use of MDI's    Goals/Expected Outcomes  -  -  patient will state compliance with home O2 therapy  patient will state compliance with home O2 therapy       Oxygen Discharge  (Final Oxygen Re-Evaluation): Oxygen Re-Evaluation - 01/15/18 1547      Program Oxygen Prescription   Program Oxygen Prescription  Continuous;E-Tanks    Liters per minute  2 2-4      Home Oxygen   Home Oxygen Device  Portable Concentrator    Sleep Oxygen Prescription  CPAP    Home Exercise Oxygen Prescription  Continuous    Liters per minute  -- 2-4    Home at Rest Exercise Oxygen Prescription  None    Compliance with Home Oxygen Use  Yes      Goals/Expected Outcomes   Short Term Goals  To learn and exhibit compliance with exercise, home and travel O2 prescription;To learn and understand importance of monitoring SPO2 with pulse oximeter and demonstrate accurate use of the pulse oximeter.;To learn and understand importance of maintaining oxygen saturations>88%;To learn and demonstrate proper pursed lip breathing techniques or other breathing techniques.;To learn  and demonstrate proper use of respiratory medications    Long  Term Goals  Exhibits compliance with exercise, home and travel O2 prescription;Verbalizes importance of monitoring SPO2 with pulse oximeter and return demonstration;Maintenance of O2 saturations>88%;Exhibits proper breathing techniques, such as pursed lip breathing or other method taught during program session;Compliance with respiratory medication;Demonstrates proper use of MDI's    Goals/Expected Outcomes  patient will state compliance with home O2 therapy       Initial Exercise Prescription: Initial Exercise Prescription - 11/02/17 1600      Date of Initial Exercise RX and Referring Provider   Date  11/02/17    Referring Provider  Dr.Ramaswamy      Oxygen   Oxygen  -- will re-evaluate needs on first day of exercise      NuStep   Level  2    SPM  80    Minutes  17    METs  1.5      Arm Ergometer   Level  1    Watts  20    Minutes  17      Track   Laps  8    Minutes  17      Prescription Details   Frequency (times per week)  2    Duration   Progress to 45 minutes of aerobic exercise without signs/symptoms of physical distress      Intensity   THRR 40-80% of Max Heartrate  61-122    Ratings of Perceived Exertion  11-13    Perceived Dyspnea  0-4      Progression   Progression  Continue progressive overload as per policy without signs/symptoms or physical distress.      Resistance Training   Training Prescription  Yes    Weight  blue bands    Reps  10-15       Perform Capillary Blood Glucose checks as needed.  Exercise Prescription Changes: Exercise Prescription Changes    Row Name 11/09/17 1200 11/14/17 1200 11/21/17 1200 11/23/17 1300 11/28/17 1200     Response to Exercise   Blood Pressure (Admit)  134/80  122/82  120/76  132/84  100/56   Blood Pressure (Exercise)  134/92  130/80  124/80  124/60  150/80   Blood Pressure (Exit)  100/70  110/70  122/80  108/78  110/60   Heart Rate (Admit)  107 bpm  88 bpm  94 bpm  94 bpm  101 bpm   Heart Rate (Exercise)  130 bpm  131 bpm  118 bpm  94 bpm  127 bpm   Heart Rate (Exit)  104 bpm  100 bpm  88 bpm  124 bpm  106 bpm   Oxygen Saturation (Admit)  94 %  94 %  92 %  94 %  91 %   Oxygen Saturation (Exercise)  89 %  87 % sat dropped while walking. O2 increased to 3L sat 91%  94 %  95 %  92 %   Oxygen Saturation (Exit)  84 %  97 %  91 %  92 %  91 %   Rating of Perceived Exertion (Exercise)  95  _0 Perceived Dyspnea (Exercise)  _1 0  2   Symptoms  1  -  -  -  -   Duration  Progress to 45 minutes of aerobic exercise without signs/symptoms of physical distress  Progress to 45 minutes of aerobic exercise without  signs/symptoms of physical distress  Progress to 45 minutes of aerobic exercise without signs/symptoms of physical distress  Progress to 45 minutes of aerobic exercise without signs/symptoms of physical distress  Progress to 45 minutes of aerobic exercise without signs/symptoms of physical distress   Intensity  Other (comment) 40-80 HRR  THRR unchanged  THRR  unchanged  THRR unchanged  THRR unchanged     Progression   Progression  Continue to progress workloads to maintain intensity without signs/symptoms of physical distress.  Continue to progress workloads to maintain intensity without signs/symptoms of physical distress.  Continue to progress workloads to maintain intensity without signs/symptoms of physical distress.  Continue to progress workloads to maintain intensity without signs/symptoms of physical distress.  Continue to progress workloads to maintain intensity without signs/symptoms of physical distress.     Resistance Training   Training Prescription  Yes  Yes  Yes  Yes  Yes   Weight  blue bands  blue bands  blue bands  blue bands  blue bands   Reps  10-15  10-15  10-15  10-15  10-15   Time  -  10 Minutes  10 Minutes  10 Minutes  10 Minutes     Interval Training   Interval Training  -  -  -  -  -     Oxygen   Oxygen  Continuous  Continuous  Continuous  Continuous  Continuous   Liters  2  2-3  2-3  2-3  2-3     NuStep   Level  _0 -  3   Minutes  _1 -  17   METs  2  2  1.7  -  1.9     Arm Ergometer   Level  -  _2 Minutes  -  _3 Track   Laps  _4 Minutes  _5 Row Name 11/30/17 1200 12/05/17 1100 12/12/17 1400 12/14/17 1300 12/21/17 1225     Response to Exercise   Blood Pressure (Admit)  100/80  108/64  134/60  110/70  104/64   Blood Pressure (Exercise)  124/70  126/70  150/90  122/62  136/80   Blood Pressure (Exit)  114/64  112/78  108/66  90/60 drank H20 recheck 106/60  98/64   Heart Rate (Admit)  107 bpm  118 bpm  91 bpm  97 bpm  93 bpm   Heart Rate (Exercise)  135 bpm  133 bpm  126 bpm  108 bpm  127 bpm   Heart Rate (Exit)  110 bpm  110 bpm  96 bpm  97 bpm  108 bpm   Oxygen Saturation (Admit)  88 %  94 %  90 %  97 %  94 %   Oxygen Saturation (Exercise)  93 %  93 %  92 %  95 %  91 %   Oxygen Saturation (Exit)  92 %  92 %  92 %  91 %  94 %   Rating  of Perceived Exertion (Exercise)  _6 Perceived Dyspnea (Exercise)  _7 0  3   Duration  Progress to 45 minutes of aerobic exercise without signs/symptoms of  physical distress  Progress to 45 minutes of aerobic exercise without signs/symptoms of physical distress  Progress to 45 minutes of aerobic exercise without signs/symptoms of physical distress  Progress to 45 minutes of aerobic exercise without signs/symptoms of physical distress  Progress to 45 minutes of aerobic exercise without signs/symptoms of physical distress   Intensity  THRR unchanged  THRR unchanged  THRR unchanged  THRR unchanged  THRR unchanged     Progression   Progression  Continue to progress workloads to maintain intensity without signs/symptoms of physical distress.  Continue to progress workloads to maintain intensity without signs/symptoms of physical distress.  Continue to progress workloads to maintain intensity without signs/symptoms of physical distress.  Continue to progress workloads to maintain intensity without signs/symptoms of physical distress.  Continue to progress workloads to maintain intensity without signs/symptoms of physical distress.     Resistance Training   Training Prescription  Yes  Yes  Yes  Yes  Yes   Weight  blue bands  blue bands  blue bands  blue bands  blue bands   Reps  10-15  10-15  10-15  10-15  10-15   Time  10 Minutes  10 Minutes  10 Minutes  10 Minutes  10 Minutes     Oxygen   Oxygen  Continuous  Continuous  Continuous  Continuous  Continuous   Liters  _0 2-4     NuStep   Level  _1 SPM  -  80  -  -  -   Minutes  _2 METs  _3 2.1  2.2     Arm Ergometer   Level  -  _4 Minutes  -  _5 Track   Laps  _6 -  12   Minutes  _7 -  17     Home Exercise Plan   Plans to continue exercise at  -  -  Home (comment)  -  -   Frequency  -  -  Add 3 additional days to program exercise  sessions.  -  -   Row Name 01/02/18 1200 01/04/18 1200 01/09/18 1200         Response to Exercise   Blood Pressure (Admit)  110/62  130/80  100/64     Blood Pressure (Exercise)  136/90  - RD consult  138/80     Blood Pressure (Exit)  98/60  104/64  120/70     Heart Rate (Admit)  103 bpm  95 bpm  101 bpm     Heart Rate (Exercise)  13 bpm  -  151 bpm HR up on track, encouraged rest stops sooner     Heart Rate (Exit)  106 bpm  81 bpm  108 bpm     Oxygen Saturation (Admit)  90 %  93 %  94 %     Oxygen Saturation (Exercise)  93 %  -  94 %     Oxygen Saturation (Exit)  94 %  93 %  91 %     Rating of Perceived Exertion (Exercise)  15  -  15     Perceived Dyspnea (Exercise)  3.5  -  3     Duration  Progress to 45 minutes of aerobic exercise without signs/symptoms of physical distress  Progress to 45 minutes of aerobic exercise without signs/symptoms of physical distress  Progress to 45 minutes of aerobic exercise without signs/symptoms of physical distress     Intensity  THRR unchanged  THRR unchanged  THRR unchanged       Progression   Progression  Continue to progress workloads to maintain intensity without signs/symptoms of physical distress.  Continue to progress workloads to maintain intensity without signs/symptoms of physical distress.  Continue to progress workloads to maintain intensity without signs/symptoms of physical distress.       Resistance Training   Training Prescription  Yes  Yes  Yes     Weight  blue bands  blue bands  blue bands     Reps  10-15  10-15  10-15     Time  10 Minutes  10 Minutes  10 Minutes       Oxygen   Oxygen  Continuous  Continuous  Continuous     Liters  2.4  2-4  2.4       NuStep   Level  5  -  6     Minutes  17  -  17     METs  2.1  -  2.3       Arm Ergometer   Level  4  -  4     Minutes  17  -  17       Track   Laps  10  -  8     Minutes  17  -  17        Exercise Comments: Exercise Comments    Row Name 12/12/17 1615            Exercise Comments  Home exercise completed          Exercise Goals and Review:   Exercise Goals Re-Evaluation : Exercise Goals Re-Evaluation    Row Name 11/09/17 1544 11/28/17 0746 12/14/17 0717 01/15/18 1546       Exercise Goal Re-Evaluation   Exercise Goals Review  Increase Physical Activity;Increase Strength and Stamina;Able to understand and use Dyspnea scale;Able to understand and use rate of perceived exertion (RPE) scale;Knowledge and understanding of Target Heart Rate Range (THRR);Understanding of Exercise Prescription  Increase Physical Activity;Increase Strength and Stamina;Able to understand and use Dyspnea scale;Able to understand and use rate of perceived exertion (RPE) scale;Knowledge and understanding of Target Heart Rate Range (THRR);Understanding of Exercise Prescription  Increase Strength and Stamina;Increase Physical Activity;Able to understand and use Dyspnea scale;Able to understand and use rate of perceived exertion (RPE) scale;Knowledge and understanding of Target Heart Rate Range (THRR);Understanding of Exercise Prescription  Increase Strength and Stamina;Increase Physical Activity;Able to understand and use Dyspnea scale;Able to understand and use rate of perceived exertion (RPE) scale;Knowledge and understanding of Target Heart Rate Range (THRR);Understanding of Exercise Prescription    Comments  Patient has only attended one rehab sessions. Will cont. to monitor and progress as able.   Patient has only attended 4 exercise sessions. We have requested oxygen for home from [place]. Patient shows initiative and motivation. Will cont. to monitor and progress as able.   Patient shows initiative and motivation. Home exercise has been completed. Has adequate oxygen at home. Weight loss is biggest goal. Will cont. to monitor and progress.  Patient shows initiative and motivation. Home exercise has been completed. Has adequate oxygen at home. Weight  loss is biggest goal--has gained  3.6kgs since being in the program. Will cont. to monitor and progress.    Expected Outcomes  Through exercise at rehab and at home, patient will increase strength and stamina making ADL's easier to perform. Patient will also have a better understanding of safe exercise and what they are capable of doing outside of clinical supervision.   Through exercise at rehab and at home, patient will increase strength and stamina and understand how to safely exercise at home.   Through exercise at rehab and at home, patient will increase strength and stamina making ADL's easier to perform. Patient will also have a better understanding of safe exercise and what they are capable to do outside of clinical supervision.  Through exercise at rehab and at home, patient will increase strength and stamina making ADL's easier to perform. Patient will also have a better understanding of safe exercise and what they are capable to do outside of clinical supervision.       Discharge Exercise Prescription (Final Exercise Prescription Changes): Exercise Prescription Changes - 01/09/18 1200      Response to Exercise   Blood Pressure (Admit)  100/64    Blood Pressure (Exercise)  138/80    Blood Pressure (Exit)  120/70    Heart Rate (Admit)  101 bpm    Heart Rate (Exercise)  151 bpm HR up on track, encouraged rest stops sooner    Heart Rate (Exit)  108 bpm    Oxygen Saturation (Admit)  94 %    Oxygen Saturation (Exercise)  94 %    Oxygen Saturation (Exit)  91 %    Rating of Perceived Exertion (Exercise)  15    Perceived Dyspnea (Exercise)  3    Duration  Progress to 45 minutes of aerobic exercise without signs/symptoms of physical distress    Intensity  THRR unchanged      Progression   Progression  Continue to progress workloads to maintain intensity without signs/symptoms of physical distress.      Resistance Training   Training Prescription  Yes    Weight  blue bands    Reps  10-15    Time  10 Minutes       Oxygen   Oxygen  Continuous    Liters  2.4      NuStep   Level  6    Minutes  17    METs  2.3      Arm Ergometer   Level  4    Minutes  17      Track   Laps  8    Minutes  17       Nutrition:  Target Goals: Understanding of nutrition guidelines, daily intake of sodium <153m, cholesterol <2041m calories 30% from fat and 7% or less from saturated fats, daily to have 5 or more servings of fruits and vegetables.  Biometrics: Pre Biometrics - 10/30/17 1319      Pre Biometrics   Grip Strength  31 kg        Nutrition Therapy Plan and Nutrition Goals: Nutrition Therapy & Goals - 11/24/17 1201      Nutrition Therapy   Diet  General, Healthful      Personal Nutrition Goals   Nutrition Goal  Identify food quantities necessary to achieve wt loss of  -2# per week to a goal wt loss of 6-24 lb at graduation from pulmonary rehab.      Intervention Plan   Intervention  Prescribe,  educate and counsel regarding individualized specific dietary modifications aiming towards targeted core components such as weight, hypertension, lipid management, diabetes, heart failure and other comorbidities.    Expected Outcomes  Short Term Goal: Understand basic principles of dietary content, such as calories, fat, sodium, cholesterol and nutrients.;Long Term Goal: Adherence to prescribed nutrition plan.       Nutrition Assessments: Nutrition Assessments - 11/24/17 1155      Rate Your Plate Scores   Pre Score  51       Nutrition Goals Re-Evaluation:   Nutrition Goals Discharge (Final Nutrition Goals Re-Evaluation):   Psychosocial: Target Goals: Acknowledge presence or absence of significant depression and/or stress, maximize coping skills, provide positive support system. Participant is able to verbalize types and ability to use techniques and skills needed for reducing stress and depression.  Initial Review & Psychosocial Screening: Initial Psych Review & Screening - 10/30/17 1325       Initial Review   Current issues with  None Identified      Family Dynamics   Good Support System?  Yes      Barriers   Psychosocial barriers to participate in program  There are no identifiable barriers or psychosocial needs.       Quality of Life Scores:  Scores of 19 and below usually indicate a poorer quality of life in these areas.  A difference of  2-3 points is a clinically meaningful difference.  A difference of 2-3 points in the total score of the Quality of Life Index has been associated with significant improvement in overall quality of life, self-image, physical symptoms, and general health in studies assessing change in quality of life.   PHQ-9: Recent Review Flowsheet Data    Depression screen [facility] 2/9 10/30/2017   Decreased Interest 0   Down, Depressed, Hopeless 1   PHQ - 2 Score 1     Interpretation of Total Score  Total Score Depression Severity:  1-4 = Minimal depression, 5-9 = Mild depression, 10-14 = Moderate depression, 15-19 = Moderately severe depression, 20-27 = Severe depression   Psychosocial Evaluation and Intervention: Psychosocial Evaluation - 10/30/17 1325      Psychosocial Evaluation & Interventions   Interventions  Encouraged to exercise with the program and follow exercise prescription    Continue Psychosocial Services   No Follow up required       Psychosocial Re-Evaluation: Psychosocial Re-Evaluation    [place] Name 11/09/17 1615 11/28/17 1256 12/21/17 0716 01/15/18 1421       Psychosocial Re-Evaluation   Current issues with  None Identified  None Identified  None Identified  None Identified    Expected Outcomes  patient will remain free from psychosocial barriers to participation in pulmonary rehab  patient will remain free from psychosocial barriers to participation in pulmonary rehab  patient will remain free from psychosocial barriers to participation in pulmonary rehab  patient will remain free from psychosocial barriers to  participation in pulmonary rehab    Continue Psychosocial Services   No Follow up required  No Follow up required  No Follow up required  No Follow up required       Psychosocial Discharge (Final Psychosocial Re-Evaluation): Psychosocial Re-Evaluation - 01/15/18 1421      Psychosocial Re-Evaluation   Current issues with  None Identified    Expected Outcomes  patient will remain free from psychosocial barriers to participation in pulmonary rehab    Continue Psychosocial Services   No Follow up required  Education: Education Goals: Education classes will be provided on a weekly basis, covering required topics. Participant will state understanding/return demonstration of topics presented.  Learning Barriers/Preferences:   Education Topics: Risk Factor Reduction:  -Group instruction that is supported by a PowerPoint presentation. Instructor discusses the definition of a risk factor, different risk factors for pulmonary disease, and how the heart and lungs work together.     Nutrition for Pulmonary Patient:  -Group instruction provided by PowerPoint slides, verbal discussion, and written materials to support subject matter. The instructor gives an explanation and review of healthy diet recommendations, which includes a discussion on weight management, recommendations for fruit and vegetable consumption, as well as protein, fluid, caffeine, fiber, sodium, sugar, and alcohol. Tips for eating when patients are short of breath are discussed.   PULMONARY REHAB OTHER RESPIRATORY from 01/04/2018 in [place]  Date  12/28/17  Educator  edna  Instruction Review Code  2- meets goals/outcomes      Pursed Lip Breathing:  -Group instruction that is supported by demonstration and informational handouts. Instructor discusses the benefits of pursed lip and diaphragmatic breathing and detailed demonstration on how to preform both.     Oxygen Safety:  -Group  instruction provided by PowerPoint, verbal discussion, and written material to support subject matter. There is an overview of "What is Oxygen" and "Why do we need it".  Instructor also reviews how to create a safe environment for oxygen use, the importance of using oxygen as prescribed, and the risks of noncompliance. There is a brief discussion on traveling with oxygen and resources the patient may utilize.   Oxygen Equipment:  -Group instruction provided by [facility] Staff utilizing handouts, written materials, and equipment demonstrations.   PULMONARY REHAB OTHER RESPIRATORY from 01/04/2018 in [place]  Date  11/23/17  Educator  [facility]  Instruction Review Code  2- meets goals/outcomes      Signs and Symptoms:  -Group instruction provided by written material and verbal discussion to support subject matter. Warning signs and symptoms of infection, stroke, and heart attack are reviewed and when to call the physician/911 reinforced. Tips for preventing the spread of infection discussed.   Advanced Directives:  -Group instruction provided by verbal instruction and written material to support subject matter. Instructor reviews Advanced Directive laws and proper instruction for filling out document.   Pulmonary Video:  -Group video education that reviews the importance of medication and oxygen compliance, exercise, good nutrition, pulmonary hygiene, and pursed lip and diaphragmatic breathing for the pulmonary patient.   Exercise for the Pulmonary Patient:  -Group instruction that is supported by a PowerPoint presentation. Instructor discusses benefits of exercise, core components of exercise, frequency, duration, and intensity of an exercise routine, importance of utilizing pulse oximetry during exercise, safety while exercising, and options of places to exercise outside of rehab.     Pulmonary Medications:  -Verbally interactive group education  provided by instructor with focus on inhaled medications and proper administration.   Anatomy and Physiology of the Respiratory System and Intimacy:  -Group instruction provided by PowerPoint, verbal discussion, and written material to support subject matter. Instructor reviews respiratory cycle and anatomical components of the respiratory system and their functions. Instructor also reviews differences in obstructive and restrictive respiratory diseases with examples of each. Intimacy, Sex, and Sexuality differences are reviewed with a discussion on how relationships can change when diagnosed with pulmonary disease. Common sexual concerns are reviewed.   PULMONARY REHAB OTHER  RESPIRATORY from 01/04/2018 in [place]  Date  12/14/17  Educator  RN  Instruction Review Code  2- meets goals/outcomes      MD DAY -A group question and answer session with a medical doctor that allows participants to ask questions that relate to their pulmonary disease state.   PULMONARY REHAB OTHER RESPIRATORY from 01/04/2018 in [place]  Date  01/04/18  Educator  Nelda Marseille  Instruction Review Code  2- meets goals/outcomes      OTHER EDUCATION -Group or individual verbal, written, or video instructions that support the educational goals of the pulmonary rehab program.   Knowledge Questionnaire Score: Knowledge Questionnaire Score - 11/09/17 1423      Knowledge Questionnaire Score   Pre Score  15/18       Core Components/Risk Factors/Patient Goals at Admission: Personal Goals and Risk Factors at Admission - 10/30/17 1322      Core Components/Risk Factors/Patient Goals on Admission   Improve shortness of breath with ADL's  Yes    Intervention  Provide education, individualized exercise plan and daily activity instruction to help decrease symptoms of SOB with activities of daily living.    Expected Outcomes  Short Term: Achieves a reduction of  symptoms when performing activities of daily living.    Develop more efficient breathing techniques such as purse lipped breathing and diaphragmatic breathing; and practicing self-pacing with activity  Yes    Intervention  Provide education, demonstration and support about specific breathing techniuqes utilized for more efficient breathing. Include techniques such as pursed lipped breathing, diaphragmatic breathing and self-pacing activity.    Expected Outcomes  Short Term: Participant will be able to demonstrate and use breathing techniques as needed throughout daily activities.    Increase knowledge of respiratory medications and ability to use respiratory devices properly   Yes    Intervention  Provide education and demonstration as needed of appropriate use of medications, inhalers, and oxygen therapy.    Expected Outcomes  Short Term: Achieves understanding of medications use. Understands that oxygen is a medication prescribed by physician. Demonstrates appropriate use of inhaler and oxygen therapy.       Core Components/Risk Factors/Patient Goals Review:  Goals and Risk Factor Review    Row Name 11/09/17 1614 11/28/17 1254 11/28/17 1256 12/21/17 0714 12/21/17 0716     Core Components/Risk Factors/Patient Goals Review   Personal Goals Review  Improve shortness of breath with ADL's;Develop more efficient breathing techniques such as purse lipped breathing and diaphragmatic breathing and practicing self-pacing with activity.;Increase knowledge of respiratory medications and ability to use respiratory devices properly.  Improve shortness of breath with ADL's;Develop more efficient breathing techniques such as purse lipped breathing and diaphragmatic breathing and practicing self-pacing with activity.;Increase knowledge of respiratory medications and ability to use respiratory devices properly.  -  Improve shortness of breath with ADL's;Develop more efficient breathing techniques such as purse lipped  breathing and diaphragmatic breathing and practicing self-pacing with activity.;Increase knowledge of respiratory medications and ability to use respiratory devices properly.  -   Review  patient has only attended 1 exercise session since admission and too soon to evaluate progress towards goals  patient has attended 5 session since admission and states she is seeing an improvement in her stamina and strenght during exertion in pulm rehab. She feels this is related to oxygen use. She has not recieved home oxygen as of today. She is awaiting delivery. She feels once she has oxygen for  home exertional activities she will see an improvement in her shortness of breath. She still need coaching in PLB.  -  patient is progressing well in pulmonary rehab. She is continueing to see an improvement in her stamina and strength especially now that she has home O2. She is observed using PLB more effeciently and has to be reminded less while exercising.  -   Expected Outcomes  -  -  see admission outcomes  -  see admission outcomes   Row Name 01/15/18 1420             Core Components/Risk Factors/Patient Goals Review   Personal Goals Review  Improve shortness of breath with ADL's;Develop more efficient breathing techniques such as purse lipped breathing and diaphragmatic breathing and practicing self-pacing with activity.;Increase knowledge of respiratory medications and ability to use respiratory devices properly.       Review  patient is progressing well in pulmonary rehab. She is continuing to see an improvement in her stamina and strength with using O2. She is observed using PLB more effeciently and has to be reminded less while exercising.       Expected Outcomes  see admission outcomes          Core Components/Risk Factors/Patient Goals at Discharge (Final Review):  Goals and Risk Factor Review - 01/15/18 1420      Core Components/Risk Factors/Patient Goals Review   Personal Goals Review  Improve  shortness of breath with ADL's;Develop more efficient breathing techniques such as purse lipped breathing and diaphragmatic breathing and practicing self-pacing with activity.;Increase knowledge of respiratory medications and ability to use respiratory devices properly.    Review  patient is progressing well in pulmonary rehab. She is continuing to see an improvement in her stamina and strength with using O2. She is observed using PLB more effeciently and has to be reminded less while exercising.    Expected Outcomes  see admission outcomes       ITP Comments:   Comments: Patient has attended 14 pulmonary rehab sessions since admission.

## 2018-01-17 ENCOUNTER — Other Ambulatory Visit: Payer: Self-pay

## 2018-01-17 MED ORDER — AMOXICILLIN-POT CLAVULANATE 875-125 MG PO TABS: 1.0000 | ORAL_TABLET | Freq: Two times a day (BID) | ORAL | 0 refills | Status: DC

## 2018-01-18 ENCOUNTER — Telehealth (HOSPITAL_COMMUNITY): Payer: Self-pay | Admitting: General Practice

## 2018-01-18 ENCOUNTER — Encounter (HOSPITAL_COMMUNITY): Payer: Medicare Other

## 2018-01-22 ENCOUNTER — Telehealth (HOSPITAL_COMMUNITY): Payer: Self-pay | Admitting: General Practice

## 2018-01-23 ENCOUNTER — Encounter (HOSPITAL_COMMUNITY): Payer: Medicare Other

## 2018-01-25 ENCOUNTER — Encounter (HOSPITAL_COMMUNITY): Payer: Medicare Other

## 2018-01-29 ENCOUNTER — Ambulatory Visit (INDEPENDENT_AMBULATORY_CARE_PROVIDER_SITE_OTHER)
Admission: RE | Admit: 2018-01-29 | Discharge: 2018-01-29 | Disposition: A | Discharge status: Home / Self Care | Payer: Medicare Other | Source: Ambulatory Visit | Attending: Internal Medicine | Admitting: Internal Medicine

## 2018-01-29 DIAGNOSIS — J849 Interstitial pulmonary disease, unspecified: Secondary | ICD-10-CM | POA: Diagnosis not present

## 2018-01-29 DIAGNOSIS — R0989 Other specified symptoms and signs involving the circulatory and respiratory systems: Secondary | ICD-10-CM | POA: Diagnosis not present

## 2018-01-30 ENCOUNTER — Encounter (HOSPITAL_COMMUNITY)
Admission: RE | Admit: 2018-01-30 | Discharge: 2018-01-30 | Disposition: A | Discharge status: Home / Self Care | Payer: Medicare Other | Source: Ambulatory Visit | Attending: Internal Medicine | Admitting: Internal Medicine

## 2018-01-30 VITALS — Wt 258.2 lb

## 2018-01-30 DIAGNOSIS — J849 Interstitial pulmonary disease, unspecified: Secondary | ICD-10-CM | POA: Insufficient documentation

## 2018-01-30 DIAGNOSIS — Z9889 Other specified postprocedural states: Secondary | ICD-10-CM

## 2018-01-30 NOTE — Progress Notes (Signed)
Daily Session Note  Patient Details  Name: Patty [name] MRN: [id number] Date of Birth: [birth year] Referring Provider:     Pulmonary Rehab Walk Test from 11/02/2017 in [place]  Referring Provider  Dr.Ramaswamy      Encounter Date: 01/30/2018  Check In: Session Check In - 01/30/18 1216      Check-In   Location  [facility]    Staff Present  Rosebud Poles, RN, Luisa Hart, RN, BSN;Lisa Ysidro Evert, RN;Shaquetta Arcos, MS, ACSM RCEP, Exercise Physiologist    Supervising physician immediately available to respond to emergencies  [facility] immediately available    Physician(s)  Dr. Tyrell Antonio    Medication changes reported      No    Fall or balance concerns reported     No    Tobacco Cessation  No Change    Warm-up and Cool-down  Performed as group-led instruction    Resistance Training Performed  Yes    VAD Patient?  No      Pain Assessment   Currently in Pain?  No/denies    Multiple Pain Sites  No       Capillary Blood Glucose: No results found for this or any previous visit (from the past 24 hour(s)).  Exercise Prescription Changes - 01/30/18 1200      Response to Exercise   Blood Pressure (Admit)  118/70    Blood Pressure (Exercise)  130/70    Blood Pressure (Exit)  112/60    Heart Rate (Admit)  98 bpm    Heart Rate (Exercise)  122 bpm HR up on track, encouraged rest stops sooner    Heart Rate (Exit)  103 bpm    Oxygen Saturation (Admit)  94 %    Oxygen Saturation (Exercise)  94 %    Oxygen Saturation (Exit)  92 %    Rating of Perceived Exertion (Exercise)  13    Perceived Dyspnea (Exercise)  2    Duration  Progress to 45 minutes of aerobic exercise without signs/symptoms of physical distress    Intensity  THRR unchanged      Progression   Progression  Continue to progress workloads to maintain intensity without signs/symptoms of physical distress.      Resistance Training   Training Prescription  Yes    Weight  blue bands    Reps  10-15    Time  10 Minutes      Oxygen   Oxygen  Continuous    Liters  2.4      NuStep   Level  6    Minutes  17    METs  2      Arm Ergometer   Level  4.5    Minutes  17      Track   Laps  11    Minutes  17       Social History   Tobacco Use  Smoking Status Former Smoker  . Packs/day: 1.00  . Years: 45.00  . Pack years: 45.00  . Types: Cigarettes  . Last attempt to quit: 12/26/2008  . Years since quitting: 9.1  Smokeless Tobacco Never Used    Goals Met:  Exercise tolerated well No report of cardiac concerns or symptoms Strength training completed today  Goals Unmet:  Not Applicable  Comments: Service time is from 10:30a to 12:05p    Dr. Rush Farmer is Medical Director for Pulmonary Rehab at [facility].

## 2018-02-01 ENCOUNTER — Encounter (HOSPITAL_COMMUNITY)
Admission: RE | Admit: 2018-02-01 | Discharge: 2018-02-01 | Disposition: A | Discharge status: Home / Self Care | Payer: Medicare Other | Source: Ambulatory Visit | Attending: Internal Medicine | Admitting: Internal Medicine

## 2018-02-01 VITALS — Wt 261.7 lb

## 2018-02-01 DIAGNOSIS — J849 Interstitial pulmonary disease, unspecified: Secondary | ICD-10-CM

## 2018-02-01 DIAGNOSIS — Z9889 Other specified postprocedural states: Secondary | ICD-10-CM

## 2018-02-01 NOTE — Progress Notes (Signed)
Daily Session Note  Patient Details  Name: Patty [name] MRN: [id number] Date of Birth: 08-26-1951 Referring Provider:     Pulmonary Rehab Walk Test from 11/02/2017 in [place]  Referring Provider  Dr.Ramaswamy      Encounter Date: 02/01/2018  Check In: Session Check In - 02/01/18 1032      Check-In   Location  [facility]    Staff Present  Rosebud Poles, RN, Luisa Hart, RN, BSN;Lisa Ysidro Evert, RN;Molly diVincenzo, MS, ACSM RCEP, Exercise Physiologist    Supervising physician immediately available to respond to emergencies  [facility] immediately available    Physician(s)  Dr. Denton Brick    Medication changes reported      No    Fall or balance concerns reported     No    Tobacco Cessation  No Change    Warm-up and Cool-down  Performed as group-led instruction    Resistance Training Performed  Yes    VAD Patient?  No      Pain Assessment   Currently in Pain?  No/denies    Multiple Pain Sites  No       Capillary Blood Glucose: No results found for this or any previous visit (from the past 24 hour(s)).    Social History   Tobacco Use  Smoking Status Former Smoker  . Packs/day: 1.00  . Years: 45.00  . Pack years: 45.00  . Types: Cigarettes  . Last attempt to quit: 12/26/2008  . Years since quitting: 9.1  Smokeless Tobacco Never Used    Goals Met:  Exercise tolerated well Strength training completed today  Goals Unmet:  Not Applicable  Comments:Service time is from 1030 to 1230.    Dr. Rush Farmer is Medical Director for Pulmonary Rehab at [facility].

## 2018-02-05 ENCOUNTER — Ambulatory Visit (INDEPENDENT_AMBULATORY_CARE_PROVIDER_SITE_OTHER): Payer: Medicare Other | Admitting: Internal Medicine

## 2018-02-05 DIAGNOSIS — R0602 Shortness of breath: Secondary | ICD-10-CM

## 2018-02-05 DIAGNOSIS — J849 Interstitial pulmonary disease, unspecified: Secondary | ICD-10-CM

## 2018-02-05 LAB — PULMONARY FUNCTION TEST
DL/VA % pred: 65 %
DL/VA: 3.14 ml/min/mmHg/L
DLCO unc % pred: 62 %
DLCO unc: 15.15 ml/min/mmHg
FEF 25-75 Pre: 1.03 L/sec
FEF2575-%Pred-Pre: 50 %
FEV1-%Pred-Pre: 72 %
FEV1-Pre: 1.71 L
FEV1FVC-%Pred-Pre: 87 %
FEV6-%Pred-Pre: 86 %
FEV6-Pre: 2.56 L
FEV6FVC-%Pred-Pre: 103 %
FVC-%Pred-Pre: 83 %
FVC-Pre: 2.57 L
Pre FEV1/FVC ratio: 67 %
Pre FEV6/FVC Ratio: 100 %

## 2018-02-05 NOTE — Progress Notes (Signed)
PFT completed today.  

## 2018-02-06 ENCOUNTER — Encounter (HOSPITAL_COMMUNITY)
Admission: RE | Admit: 2018-02-06 | Discharge: 2018-02-06 | Disposition: A | Discharge status: Home / Self Care | Payer: Medicare Other | Source: Ambulatory Visit | Attending: Internal Medicine | Admitting: Internal Medicine

## 2018-02-06 DIAGNOSIS — J849 Interstitial pulmonary disease, unspecified: Secondary | ICD-10-CM

## 2018-02-06 DIAGNOSIS — Z9889 Other specified postprocedural states: Secondary | ICD-10-CM

## 2018-02-06 NOTE — Progress Notes (Signed)
Daily Session Note  Patient Details  Name: Patty [name] MRN: [id number] Date of Birth: 12/31/1950 Referring Provider:     Pulmonary Rehab Walk Test from 11/02/2017 in [place]  Referring Provider  Dr.Ramaswamy      Encounter Date: 02/06/2018  Check In: Session Check In - 02/06/18 1217      Check-In   Location  [facility]    Staff Present  Rosebud Poles, RN, Luisa Hart, RN, BSN;Lisa Ysidro Evert, RN;Demetries Coia, MS, ACSM RCEP, Exercise Physiologist    Supervising physician immediately available to respond to emergencies  [facility] immediately available    Physician(s)  Dr. Lonny Prude    Medication changes reported      No    Fall or balance concerns reported     No    Tobacco Cessation  No Change    Warm-up and Cool-down  Performed as group-led instruction    Resistance Training Performed  Yes    VAD Patient?  No      Pain Assessment   Currently in Pain?  No/denies    Multiple Pain Sites  No       Capillary Blood Glucose: No results found for this or any previous visit (from the past 24 hour(s)).    Social History   Tobacco Use  Smoking Status Former Smoker  . Packs/day: 1.00  . Years: 45.00  . Pack years: 45.00  . Types: Cigarettes  . Last attempt to quit: 12/26/2008  . Years since quitting: 9.1  Smokeless Tobacco Never Used    Goals Met:  Exercise tolerated well No report of cardiac concerns or symptoms Strength training completed today  Goals Unmet:  Not Applicable  Comments: Service time is from 10:30a to 12:10p    Dr. Rush Farmer is Medical Director for Pulmonary Rehab at [facility].

## 2018-02-06 NOTE — Progress Notes (Signed)
Pulmonary Individual Treatment Plan  Patient Details  Name: Patty [name] MRN: [id number] Date of Birth: [birth year] Referring Provider:     Pulmonary Rehab Walk Test from 11/02/2017 in [facility]  Referring Provider  Dr.Ramaswamy      Initial Encounter Date:    Pulmonary Rehab Walk Test from 11/02/2017 in [facility]  Date  11/02/17  Referring Provider  Dr.Ramaswamy      Visit Diagnosis: Interstitial pulmonary disease ([place])  Patient's Home Medications on Admission:   Current Outpatient Medications:  .  amoxicillin-clavulanate (AUGMENTIN) 875-125 MG tablet, Take 1 tablet by mouth 2 (two) times daily., Disp: 14 tablet, Rfl: 0 .  aspirin EC 81 MG tablet, Take 81 mg by mouth daily., Disp: , Rfl:  .  B Complex-C (B-COMPLEX WITH VITAMIN C) tablet, Take 1 tablet by mouth daily., Disp: , Rfl:  .  Biotin 5000 MCG SUBL, Place 5,000 mcg under the tongue daily., Disp: , Rfl:  .  Cetirizine HCl (ZYRTEC ALLERGY) 10 MG CAPS, TAKE 1 TABLET DAILY ALONG WITH THE LASIX, Disp: 90 capsule, Rfl: 3 .  Cetirizine-Pseudoephedrine (ZYRTEC-D PO), Take 0.5-1 tablets by mouth daily as needed (for sinus congestion/allergies.). , Disp: , Rfl:  .  Cholecalciferol (VITAMIN D) 2000 units tablet, Take 2,000 Units by mouth daily., Disp: , Rfl:  .  Chromium 500 MCG TABS, Take 500 mcg by mouth every 3 (three) days., Disp: , Rfl:  .  clonazePAM (KLONOPIN) 0.5 MG tablet, Take 0.125-0.25 mg by mouth daily as needed (for anxiety or sleep.)., Disp: , Rfl:  .  cyanocobalamin (,VITAMIN B-12,) 1000 MCG/ML injection, Inject 1,000 mcg into the muscle every 30 (thirty) days. Pernicious anemia, Disp: , Rfl:  .  Cyanocobalamin (VITAMIN B-12) 5000 MCG SUBL, Place 5,000 mcg under the tongue daily., Disp: , Rfl:  .  cyclopentolate (CYCLODRYL,CYCLOGYL) 2 % ophthalmic solution, Place 1 drop into both eyes 3 (three) times daily as needed (for uveitis or iritis)., Disp: , Rfl:   .  fluticasone (FLONASE) 50 MCG/ACT nasal spray, , Disp: , Rfl:  .  furosemide (LASIX) 20 MG tablet, Take 1 tablet (20 mg total) by mouth daily., Disp: 90 tablet, Rfl: 3 .  Magnesium 250 MG TABS, Take 250 mg by mouth every other day., Disp: , Rfl:  .  Misc Natural Products (FOCUSED MIND PO), Take 1 tablet by mouth every 3 (three) days. FOCUS FACTOR NUTRITIONAL SUPPLEMENT, Disp: , Rfl:  .  Multiple Vitamins-Minerals (ICAPS AREDS 2 PO), Take 1 tablet by mouth every 3 (three) days. , Disp: , Rfl:  .  Niacinamide-Zinc-Copper-FA (NICOTINAMIDE ZCF PO), Take 1 capsule by mouth daily. Nicotinamide Riboside-Pterostilbene 250-50 mg ([employer]), Disp: , Rfl:  .  OVER THE COUNTER MEDICATION, Place 1 each under the tongue daily. 3-4 DROP DAILY (CBD OIL), Disp: , Rfl:  .  potassium chloride (MICRO-K) 10 MEQ CR capsule, Take 1 capsule (10 mEq total) by mouth daily., Disp: 90 capsule, Rfl: 3 .  prednisoLONE acetate (PRED FORTE) 1 % ophthalmic suspension, Place 1 drop into both eyes 4 (four) times daily as needed (for iritis flare ups (inflammation))., Disp: , Rfl:  .  Probiotic Product (PROBIOTIC PO), Take 1 capsule by mouth once a week., Disp: , Rfl:  .  traMADol-acetaminophen (ULTRACET) 37.5-325 MG tablet, Take 0.5-1 tablets by mouth every 6 (six) hours as needed (for pain.)., Disp: , Rfl:  .  traZODone (DESYREL) 50 MG tablet, Take 25-50 mg by mouth  at bedtime as needed for sleep., Disp: , Rfl:  .  triprolidine-pseudoephedrine (WAL-ACT) 2.5-60 MG TABS tablet, Take 1 tablet by mouth every 6 (six) hours as needed for allergies., Disp: , Rfl:  .  TURMERIC PO, Take 1 capsule by mouth every other day. CURAMIN, Disp: , Rfl:   Past Medical History: Past Medical History:  Diagnosis Date  . Abnormal nuclear stress test    a. 07/2017: cath with no CAD  . Anemia   . Crohn disease ([place])   . OSA on CPAP   . Polycythemia vera ([place])     Tobacco Use: Social History   Tobacco Use  Smoking  Status Former Smoker  . Packs/day: 1.00  . Years: 45.00  . Pack years: 45.00  . Types: Cigarettes  . Last attempt to quit: 12/26/2008  . Years since quitting: 9.1  Smokeless Tobacco Never Used    Labs: Recent Review Flowsheet Data    There is no flowsheet data to display.      Capillary Blood Glucose: No results found for: GLUCAP   Pulmonary Assessment Scores: Pulmonary Assessment Scores    Row Name 11/02/17 1638 11/09/17 1424       ADL UCSD   ADL Phase  Entry  Entry    SOB Score total  -  22      CAT Score   CAT Score  -  7 Entry      mMRC Score   mMRC Score  2  -       Pulmonary Function Assessment: Pulmonary Function Assessment - 10/30/17 1313      Breath   Bilateral Breath Sounds  Clear    Shortness of Breath  Limiting activity;Yes       Exercise Target Goals:    Exercise Program Goal: Individual exercise prescription set using results from initial 6 min walk test and THRR while considering  patient's activity barriers and safety.    Exercise Prescription Goal: Initial exercise prescription builds to 30-45 minutes a day of aerobic activity, 2-3 days per week.  Home exercise guidelines will be given to patient during program as part of exercise prescription that the participant will acknowledge.  Activity Barriers & Risk Stratification: Activity Barriers & Cardiac Risk Stratification - 10/30/17 1319      Activity Barriers & Cardiac Risk Stratification   Activity Barriers  Right Hip Replacement;Deconditioning;Muscular Weakness;Shortness of Breath       6 Minute Walk: 6 Minute Walk    Row Name 11/02/17 1635         6 Minute Walk   Phase  Initial     Distance  1000 feet     Walk Time  - 5 minutes 20 seconds     # of Rest Breaks  1 40 seconds     MPH  1.89     METS  2.45     RPE  13     Perceived Dyspnea   3     Symptoms  Yes (comment)     Comments  4/10 right hip pain     Resting HR  93 bpm     Resting BP  134/85     Resting Oxygen  Saturation   93 %     Exercise Oxygen Saturation  during 6 min walk  83 %     Max Ex. HR  154 bpm     Max Ex. BP  179/98     2 Minute Post BP  136/83  Interval HR   1 Minute HR  115     2 Minute HR  127     3 Minute HR  143     4 Minute HR  120     5 Minute HR  138     6 Minute HR  154     2 Minute Post HR  117     Interval Heart Rate?  Yes       Interval Oxygen   Interval Oxygen?  Yes     Baseline Oxygen Saturation %  93 %     1 Minute Oxygen Saturation %  98 %     1 Minute Liters of Oxygen  0 L     2 Minute Oxygen Saturation %  86 %     2 Minute Liters of Oxygen  0 L     3 Minute Oxygen Saturation %  84 %     3 Minute Liters of Oxygen  0 L     4 Minute Oxygen Saturation %  90 % rest break     4 Minute Liters of Oxygen  0 L     5 Minute Oxygen Saturation %  88 %     5 Minute Liters of Oxygen  0 L     6 Minute Oxygen Saturation %  83 %     6 Minute Liters of Oxygen  0 L     2 Minute Post Oxygen Saturation %  96 %     2 Minute Post Liters of Oxygen  0 L        Oxygen Initial Assessment: Oxygen Initial Assessment - 02/02/18 0719      Home Oxygen   Home Oxygen Device  Portable Concentrator    Sleep Oxygen Prescription  CPAP    Home Exercise Oxygen Prescription  Continuous    Home at Rest Exercise Oxygen Prescription  None    Compliance with Home Oxygen Use  Yes       Oxygen Re-Evaluation: Oxygen Re-Evaluation    Row Name 11/09/17 1611 11/28/17 1252 12/21/17 0713 01/15/18 1547 02/02/18 0719     Program Oxygen Prescription   Program Oxygen Prescription  Continuous;E-Tanks  Continuous;E-Tanks  Continuous;E-Tanks  Continuous;E-Tanks  Continuous;E-Tanks   Liters per minute  _0 2-4  2   Comments  patient desaturated to 84% while walking track and was placed on 2 liters of O2 for exercise.  -  patient desaturated to 84% while walking track and was placed on 2 liters of O2 for exercise.  -  patient desaturated to 84% while walking track and was placed on 2  liters of O2 for exercise.     Home Oxygen   Home Oxygen Device  -  None patient has completed evaluation for home o2 and awaiting delivery  None  Portable Concentrator  -   Sleep Oxygen Prescription  -  None  None  CPAP  -   Home Exercise Oxygen Prescription  -  None  None  Continuous  -   Liters per minute  -  -  -  - 2-4  -   Home at Rest Exercise Oxygen Prescription  -  None  None  None  -   Compliance with Home Oxygen Use  -  -  -  Yes  -     Goals/Expected Outcomes   Short Term Goals  To learn and exhibit compliance with exercise, home and travel  O2 prescription;To learn and understand importance of monitoring SPO2 with pulse oximeter and demonstrate accurate use of the pulse oximeter.;To learn and understand importance of maintaining oxygen saturations>88%;To learn and demonstrate proper pursed lip breathing techniques or other breathing techniques.;To learn and demonstrate proper use of respiratory medications  To learn and exhibit compliance with exercise, home and travel O2 prescription;To learn and understand importance of monitoring SPO2 with pulse oximeter and demonstrate accurate use of the pulse oximeter.;To learn and understand importance of maintaining oxygen saturations>88%;To learn and demonstrate proper pursed lip breathing techniques or other breathing techniques.;To learn and demonstrate proper use of respiratory medications  To learn and exhibit compliance with exercise, home and travel O2 prescription;To learn and understand importance of monitoring SPO2 with pulse oximeter and demonstrate accurate use of the pulse oximeter.;To learn and understand importance of maintaining oxygen saturations>88%;To learn and demonstrate proper pursed lip breathing techniques or other breathing techniques.;To learn and demonstrate proper use of respiratory medications  To learn and exhibit compliance with exercise, home and travel O2 prescription;To learn and understand importance of monitoring  SPO2 with pulse oximeter and demonstrate accurate use of the pulse oximeter.;To learn and understand importance of maintaining oxygen saturations>88%;To learn and demonstrate proper pursed lip breathing techniques or other breathing techniques.;To learn and demonstrate proper use of respiratory medications  To learn and exhibit compliance with exercise, home and travel O2 prescription;To learn and understand importance of monitoring SPO2 with pulse oximeter and demonstrate accurate use of the pulse oximeter.;To learn and understand importance of maintaining oxygen saturations>88%;To learn and demonstrate proper pursed lip breathing techniques or other breathing techniques.;To learn and demonstrate proper use of respiratory medications   Long  Term Goals  Exhibits compliance with exercise, home and travel O2 prescription;Verbalizes importance of monitoring SPO2 with pulse oximeter and return demonstration;Maintenance of O2 saturations>88%;Exhibits proper breathing techniques, such as pursed lip breathing or other method taught during program session;Compliance with respiratory medication;Demonstrates proper use of MDI's  Exhibits compliance with exercise, home and travel O2 prescription;Verbalizes importance of monitoring SPO2 with pulse oximeter and return demonstration;Maintenance of O2 saturations>88%;Exhibits proper breathing techniques, such as pursed lip breathing or other method taught during program session;Compliance with respiratory medication;Demonstrates proper use of MDI's  Exhibits compliance with exercise, home and travel O2 prescription;Verbalizes importance of monitoring SPO2 with pulse oximeter and return demonstration;Maintenance of O2 saturations>88%;Exhibits proper breathing techniques, such as pursed lip breathing or other method taught during program session;Compliance with respiratory medication;Demonstrates proper use of MDI's  Exhibits compliance with exercise, home and travel O2  prescription;Verbalizes importance of monitoring SPO2 with pulse oximeter and return demonstration;Maintenance of O2 saturations>88%;Exhibits proper breathing techniques, such as pursed lip breathing or other method taught during program session;Compliance with respiratory medication;Demonstrates proper use of MDI's  Exhibits compliance with exercise, home and travel O2 prescription;Verbalizes importance of monitoring SPO2 with pulse oximeter and return demonstration;Maintenance of O2 saturations>88%;Exhibits proper breathing techniques, such as pursed lip breathing or other method taught during program session;Compliance with respiratory medication;Demonstrates proper use of MDI's   Goals/Expected Outcomes  -  -  patient will state compliance with home O2 therapy  patient will state compliance with home O2 therapy  patient will state compliance with home O2 therapy      Oxygen Discharge (Final Oxygen Re-Evaluation): Oxygen Re-Evaluation - 02/02/18 0719      Program Oxygen Prescription   Program Oxygen Prescription  Continuous;E-Tanks    Liters per minute  2    Comments  patient desaturated to 84% while  walking track and was placed on 2 liters of O2 for exercise.      Goals/Expected Outcomes   Short Term Goals  To learn and exhibit compliance with exercise, home and travel O2 prescription;To learn and understand importance of monitoring SPO2 with pulse oximeter and demonstrate accurate use of the pulse oximeter.;To learn and understand importance of maintaining oxygen saturations>88%;To learn and demonstrate proper pursed lip breathing techniques or other breathing techniques.;To learn and demonstrate proper use of respiratory medications    Long  Term Goals  Exhibits compliance with exercise, home and travel O2 prescription;Verbalizes importance of monitoring SPO2 with pulse oximeter and return demonstration;Maintenance of O2 saturations>88%;Exhibits proper breathing techniques, such as pursed lip  breathing or other method taught during program session;Compliance with respiratory medication;Demonstrates proper use of MDI's    Goals/Expected Outcomes  patient will state compliance with home O2 therapy       Initial Exercise Prescription: Initial Exercise Prescription - 11/02/17 1600      Date of Initial Exercise RX and Referring Provider   Date  11/02/17    Referring Provider  Dr.Ramaswamy      Oxygen   Oxygen  -- will re-evaluate needs on first day of exercise      NuStep   Level  2    SPM  80    Minutes  17    METs  1.5      Arm Ergometer   Level  1    Watts  20    Minutes  17      Track   Laps  8    Minutes  17      Prescription Details   Frequency (times per week)  2    Duration  Progress to 45 minutes of aerobic exercise without signs/symptoms of physical distress      Intensity   THRR 40-80% of Max Heartrate  61-122    Ratings of Perceived Exertion  11-13    Perceived Dyspnea  0-4      Progression   Progression  Continue progressive overload as per policy without signs/symptoms or physical distress.      Resistance Training   Training Prescription  Yes    Weight  blue bands    Reps  10-15       Perform Capillary Blood Glucose checks as needed.  Exercise Prescription Changes: Exercise Prescription Changes    Row Name 11/09/17 1200 11/14/17 1200 11/21/17 1200 11/23/17 1300 11/28/17 1200     Response to Exercise   Blood Pressure (Admit)  134/80  122/82  120/76  132/84  100/56   Blood Pressure (Exercise)  134/92  130/80  124/80  124/60  150/80   Blood Pressure (Exit)  100/70  110/70  122/80  108/78  110/60   Heart Rate (Admit)  107 bpm  88 bpm  94 bpm  94 bpm  101 bpm   Heart Rate (Exercise)  130 bpm  131 bpm  118 bpm  94 bpm  127 bpm   Heart Rate (Exit)  104 bpm  100 bpm  88 bpm  124 bpm  106 bpm   Oxygen Saturation (Admit)  94 %  94 %  92 %  94 %  91 %   Oxygen Saturation (Exercise)  89 %  87 % sat dropped while walking. O2 increased to 3L sat  91%  94 %  95 %  92 %   Oxygen Saturation (Exit)  84 %  97 %  91 %  92 %  91 %   Rating of Perceived Exertion (Exercise)  95  _0 Perceived Dyspnea (Exercise)  _1 0  2   Symptoms  1  -  -  -  -   Duration  Progress to 45 minutes of aerobic exercise without signs/symptoms of physical distress  Progress to 45 minutes of aerobic exercise without signs/symptoms of physical distress  Progress to 45 minutes of aerobic exercise without signs/symptoms of physical distress  Progress to 45 minutes of aerobic exercise without signs/symptoms of physical distress  Progress to 45 minutes of aerobic exercise without signs/symptoms of physical distress   Intensity  Other (comment) 40-80 HRR  THRR unchanged  THRR unchanged  THRR unchanged  THRR unchanged     Progression   Progression  Continue to progress workloads to maintain intensity without signs/symptoms of physical distress.  Continue to progress workloads to maintain intensity without signs/symptoms of physical distress.  Continue to progress workloads to maintain intensity without signs/symptoms of physical distress.  Continue to progress workloads to maintain intensity without signs/symptoms of physical distress.  Continue to progress workloads to maintain intensity without signs/symptoms of physical distress.     Resistance Training   Training Prescription  Yes  Yes  Yes  Yes  Yes   Weight  blue bands  blue bands  blue bands  blue bands  blue bands   Reps  10-15  10-15  10-15  10-15  10-15   Time  -  10 Minutes  10 Minutes  10 Minutes  10 Minutes     Interval Training   Interval Training  -  -  -  -  -     Oxygen   Oxygen  Continuous  Continuous  Continuous  Continuous  Continuous   Liters  2  2-3  2-3  2-3  2-3     NuStep   Level  _2 -  3   Minutes  _3 -  17   METs  2  2  1.7  -  1.9     Arm Ergometer   Level  -  _4 Minutes  -  _5 Track   Laps  _6 Minutes  _7 Row Name 11/30/17 1200 12/05/17 1100 12/12/17 1400 12/14/17 1300 12/21/17 1225     Response to Exercise   Blood Pressure (Admit)  100/80  108/64  134/60  110/70  104/64   Blood Pressure (Exercise)  124/70  126/70  150/90  122/62  136/80   Blood Pressure (Exit)  114/64  112/78  108/66  90/60 drank H20 recheck 106/60  98/64   Heart Rate (Admit)  107 bpm  118 bpm  91 bpm  97 bpm  93 bpm   Heart Rate (Exercise)  135 bpm  133 bpm  126 bpm  108 bpm  127 bpm   Heart Rate (Exit)  110 bpm  110 bpm  96 bpm  97 bpm  108 bpm   Oxygen Saturation (Admit)  88 %  94 %  90 %  97 %  94 %   Oxygen Saturation (Exercise)  93 %  93 %  92 %  95 %  91 %   Oxygen Saturation (Exit)  92 %  92 %  92 %  91 %  94 %   Rating of Perceived Exertion (Exercise)  _0 Perceived Dyspnea (Exercise)  _1 0  3   Duration  Progress to 45 minutes of aerobic exercise without signs/symptoms of physical distress  Progress to 45 minutes of aerobic exercise without signs/symptoms of physical distress  Progress to 45 minutes of aerobic exercise without signs/symptoms of physical distress  Progress to 45 minutes of aerobic exercise without signs/symptoms of physical distress  Progress to 45 minutes of aerobic exercise without signs/symptoms of physical distress   Intensity  THRR unchanged  THRR unchanged  THRR unchanged  THRR unchanged  THRR unchanged     Progression   Progression  Continue to progress workloads to maintain intensity without signs/symptoms of physical distress.  Continue to progress workloads to maintain intensity without signs/symptoms of physical distress.  Continue to progress workloads to maintain intensity without signs/symptoms of physical distress.  Continue to progress workloads to maintain intensity without signs/symptoms of physical distress.  Continue to progress workloads to maintain intensity without signs/symptoms of physical distress.     Resistance Training   Training  Prescription  Yes  Yes  Yes  Yes  Yes   Weight  blue bands  blue bands  blue bands  blue bands  blue bands   Reps  10-15  10-15  10-15  10-15  10-15   Time  10 Minutes  10 Minutes  10 Minutes  10 Minutes  10 Minutes     Oxygen   Oxygen  Continuous  Continuous  Continuous  Continuous  Continuous   Liters  _2 2-4     NuStep   Level  _3 SPM  -  80  -  -  -   Minutes  _4 METs  _5 2.1  2.2     Arm Ergometer   Level  -  _6 Minutes  -  _7 Track   Laps  _8 -  12   Minutes  _9 -  17     Home Exercise Plan   Plans to continue exercise at  -  -  Home (comment)  -  -   Frequency  -  -  Add 3 additional days to program exercise sessions.  -  -   Row Name 01/02/18 1200 01/04/18 1200 01/09/18 1200 01/30/18 1200       Response to Exercise   Blood Pressure (Admit)  110/62  130/80  100/64  118/70    Blood Pressure (Exercise)  136/90  - RD consult  138/80  130/70    Blood Pressure (Exit)  98/60  104/64  120/70  112/60    Heart Rate (Admit)  103 bpm  95 bpm  101 bpm  98 bpm    Heart Rate (Exercise)  13 bpm  -  151 bpm HR up on track, encouraged rest stops sooner  122 bpm HR up on track, encouraged rest stops sooner  Heart Rate (Exit)  106 bpm  81 bpm  108 bpm  103 bpm    Oxygen Saturation (Admit)  90 %  93 %  94 %  94 %    Oxygen Saturation (Exercise)  93 %  -  94 %  94 %    Oxygen Saturation (Exit)  94 %  93 %  91 %  92 %    Rating of Perceived Exertion (Exercise)  15  -  15  13    Perceived Dyspnea (Exercise)  3.5  -  3  2    Duration  Progress to 45 minutes of aerobic exercise without signs/symptoms of physical distress  Progress to 45 minutes of aerobic exercise without signs/symptoms of physical distress  Progress to 45 minutes of aerobic exercise without signs/symptoms of physical distress  Progress to 45 minutes of aerobic exercise without signs/symptoms of physical distress    Intensity  THRR  unchanged  THRR unchanged  THRR unchanged  THRR unchanged      Progression   Progression  Continue to progress workloads to maintain intensity without signs/symptoms of physical distress.  Continue to progress workloads to maintain intensity without signs/symptoms of physical distress.  Continue to progress workloads to maintain intensity without signs/symptoms of physical distress.  Continue to progress workloads to maintain intensity without signs/symptoms of physical distress.      Resistance Training   Training Prescription  Yes  Yes  Yes  Yes    Weight  blue bands  blue bands  blue bands  blue bands    Reps  10-15  10-15  10-15  10-15    Time  10 Minutes  10 Minutes  10 Minutes  10 Minutes      Oxygen   Oxygen  Continuous  Continuous  Continuous  Continuous    Liters  2.4  2-4  2.4  2.4      NuStep   Level  5  -  6  6    Minutes  17  -  17  17    METs  2.1  -  2.3  2      Arm Ergometer   Level  4  -  4  4.5    Minutes  17  -  17  17      Track   Laps  10  -  8  11    Minutes  17  -  17  17       Exercise Comments: Exercise Comments    Row Name 12/12/17 1615           Exercise Comments  Home exercise completed          Exercise Goals and Review:   Exercise Goals Re-Evaluation : Exercise Goals Re-Evaluation    Row Name 11/09/17 1544 11/28/17 0746 12/14/17 0717 01/15/18 1546 02/02/18 0720     Exercise Goal Re-Evaluation   Exercise Goals Review  Increase Physical Activity;Increase Strength and Stamina;Able to understand and use Dyspnea scale;Able to understand and use rate of perceived exertion (RPE) scale;Knowledge and understanding of Target Heart Rate Range (THRR);Understanding of Exercise Prescription  Increase Physical Activity;Increase Strength and Stamina;Able to understand and use Dyspnea scale;Able to understand and use rate of perceived exertion (RPE) scale;Knowledge and understanding of Target Heart Rate Range (THRR);Understanding of Exercise Prescription   Increase Strength and Stamina;Increase Physical Activity;Able to understand and use Dyspnea scale;Able to understand and use rate of perceived exertion (RPE) scale;Knowledge and understanding  of Target Heart Rate Range (THRR);Understanding of Exercise Prescription  Increase Strength and Stamina;Increase Physical Activity;Able to understand and use Dyspnea scale;Able to understand and use rate of perceived exertion (RPE) scale;Knowledge and understanding of Target Heart Rate Range (THRR);Understanding of Exercise Prescription  Increase Strength and Stamina;Increase Physical Activity;Able to understand and use Dyspnea scale;Able to understand and use rate of perceived exertion (RPE) scale;Knowledge and understanding of Target Heart Rate Range (THRR);Understanding of Exercise Prescription   Comments  Patient has only attended one rehab sessions. Will cont. to monitor and progress as able.   Patient has only attended 4 exercise sessions. We have requested oxygen for home from [place]. Patient shows initiative and motivation. Will cont. to monitor and progress as able.   Patient shows initiative and motivation. Home exercise has been completed. Has adequate oxygen at home. Weight loss is biggest goal. Will cont. to monitor and progress.  Patient shows initiative and motivation. Home exercise has been completed. Has adequate oxygen at home. Weight loss is biggest goal--has gained 3.6kgs since being in the program. Will cont. to monitor and progress.  Patient was out of rehab with illness from 01/09/18-01/30/18. During that time, patient gained almost 7 pounds. This is a setback since one of her biggest goals is weight loss. Will cont. to monitor patient and motivate.   Expected Outcomes  Through exercise at rehab and at home, patient will increase strength and stamina making ADL's easier to perform. Patient will also have a better understanding of safe exercise and what they are capable of doing outside of clinical  supervision.   Through exercise at rehab and at home, patient will increase strength and stamina and understand how to safely exercise at home.   Through exercise at rehab and at home, patient will increase strength and stamina making ADL's easier to perform. Patient will also have a better understanding of safe exercise and what they are capable to do outside of clinical supervision.  Through exercise at rehab and at home, patient will increase strength and stamina making ADL's easier to perform. Patient will also have a better understanding of safe exercise and what they are capable to do outside of clinical supervision.  Through exercise at rehab and at home, patient will increase strength and stamina making ADL's easier to perform. Patient will also have a better understanding of safe exercise and what they are capable to do outside of clinical supervision.      Discharge Exercise Prescription (Final Exercise Prescription Changes): Exercise Prescription Changes - 01/30/18 1200      Response to Exercise   Blood Pressure (Admit)  118/70    Blood Pressure (Exercise)  130/70    Blood Pressure (Exit)  112/60    Heart Rate (Admit)  98 bpm    Heart Rate (Exercise)  122 bpm HR up on track, encouraged rest stops sooner    Heart Rate (Exit)  103 bpm    Oxygen Saturation (Admit)  94 %    Oxygen Saturation (Exercise)  94 %    Oxygen Saturation (Exit)  92 %    Rating of Perceived Exertion (Exercise)  13    Perceived Dyspnea (Exercise)  2    Duration  Progress to 45 minutes of aerobic exercise without signs/symptoms of physical distress    Intensity  THRR unchanged      Progression   Progression  Continue to progress workloads to maintain intensity without signs/symptoms of physical distress.      Horticulturist, commercial  Prescription  Yes    Weight  blue bands    Reps  10-15    Time  10 Minutes      Oxygen   Oxygen  Continuous    Liters  2.4      NuStep   Level  6    Minutes  17     METs  2      Arm Ergometer   Level  4.5    Minutes  17      Track   Laps  11    Minutes  17       Nutrition:  Target Goals: Understanding of nutrition guidelines, daily intake of sodium <1584m, cholesterol <2065m calories 30% from fat and 7% or less from saturated fats, daily to have 5 or more servings of fruits and vegetables.  Biometrics: Pre Biometrics - 10/30/17 1319      Pre Biometrics   Grip Strength  31 kg        Nutrition Therapy Plan and Nutrition Goals: Nutrition Therapy & Goals - 11/24/17 1201      Nutrition Therapy   Diet  General, Healthful      Personal Nutrition Goals   Nutrition Goal  Identify food quantities necessary to achieve wt loss of  -2# per week to a goal wt loss of 6-24 lb at graduation from pulmonary rehab.      Intervention Plan   Intervention  Prescribe, educate and counsel regarding individualized specific dietary modifications aiming towards targeted core components such as weight, hypertension, lipid management, diabetes, heart failure and other comorbidities.    Expected Outcomes  Short Term Goal: Understand basic principles of dietary content, such as calories, fat, sodium, cholesterol and nutrients.;Long Term Goal: Adherence to prescribed nutrition plan.       Nutrition Assessments: Nutrition Assessments - 11/24/17 1155      Rate Your Plate Scores   Pre Score  51       Nutrition Goals Re-Evaluation:   Nutrition Goals Discharge (Final Nutrition Goals Re-Evaluation):   Psychosocial: Target Goals: Acknowledge presence or absence of significant depression and/or stress, maximize coping skills, provide positive support system. Participant is able to verbalize types and ability to use techniques and skills needed for reducing stress and depression.  Initial Review & Psychosocial Screening: Initial Psych Review & Screening - 10/30/17 1325      Initial Review   Current issues with  None Identified      Family Dynamics   Good  Support System?  Yes      Barriers   Psychosocial barriers to participate in program  There are no identifiable barriers or psychosocial needs.       Quality of Life Scores:  Scores of 19 and below usually indicate a poorer quality of life in these areas.  A difference of  2-3 points is a clinically meaningful difference.  A difference of 2-3 points in the total score of the Quality of Life Index has been associated with significant improvement in overall quality of life, self-image, physical symptoms, and general health in studies assessing change in quality of life.   PHQ-9: Recent Review Flowsheet Data    [facility]   Decreased Interest 0   Down, Depressed, Hopeless 1   PHQ - 2 Score 1     Interpretation of Total Score  Total Score Depression Severity:  1-4 = Minimal depression, 5-9 = Mild depression, 10-14 = Moderate depression, 15-19 = Moderately severe depression, 20-27 =  Severe depression   Psychosocial Evaluation and Intervention: Psychosocial Evaluation - 10/30/17 1325      Psychosocial Evaluation & Interventions   Interventions  Encouraged to exercise with the program and follow exercise prescription    Continue Psychosocial Services   No Follow up required       Psychosocial Re-Evaluation: Psychosocial Re-Evaluation    [place] Name 11/09/17 1615 11/28/17 1256 12/21/17 0716 01/15/18 1421 02/05/18 1647     Psychosocial Re-Evaluation   Current issues with  None Identified  None Identified  None Identified  None Identified  None Identified   Expected Outcomes  patient will remain free from psychosocial barriers to participation in pulmonary rehab  patient will remain free from psychosocial barriers to participation in pulmonary rehab  patient will remain free from psychosocial barriers to participation in pulmonary rehab  patient will remain free from psychosocial barriers to participation in pulmonary rehab  patient will remain free from  psychosocial barriers to participation in pulmonary rehab   Continue Psychosocial Services   No Follow up required  No Follow up required  No Follow up required  No Follow up required  No Follow up required      Psychosocial Discharge (Final Psychosocial Re-Evaluation): Psychosocial Re-Evaluation - 02/05/18 1647      Psychosocial Re-Evaluation   Current issues with  None Identified    Expected Outcomes  patient will remain free from psychosocial barriers to participation in pulmonary rehab    Continue Psychosocial Services   No Follow up required       Education: Education Goals: Education classes will be provided on a weekly basis, covering required topics. Participant will state understanding/return demonstration of topics presented.  Learning Barriers/Preferences:   Education Topics: Risk Factor Reduction:  -Group instruction that is supported by a PowerPoint presentation. Instructor discusses the definition of a risk factor, different risk factors for pulmonary disease, and how the heart and lungs work together.     Nutrition for Pulmonary Patient:  -Group instruction provided by PowerPoint slides, verbal discussion, and written materials to support subject matter. The instructor gives an explanation and review of healthy diet recommendations, which includes a discussion on weight management, recommendations for fruit and vegetable consumption, as well as protein, fluid, caffeine, fiber, sodium, sugar, and alcohol. Tips for eating when patients are short of breath are discussed.   PULMONARY REHAB OTHER RESPIRATORY from 02/01/2018 in [place]  Date  12/28/17  Educator  [place]      Pursed Lip Breathing:  -Group instruction that is supported by demonstration and informational handouts. Instructor discusses the benefits of pursed lip and diaphragmatic breathing and detailed demonstration on how to preform both.     Oxygen Safety:  -Group  instruction provided by PowerPoint, verbal discussion, and written material to support subject matter. There is an overview of "What is Oxygen" and "Why do we need it".  Instructor also reviews how to create a safe environment for oxygen use, the importance of using oxygen as prescribed, and the risks of noncompliance. There is a brief discussion on traveling with oxygen and resources the patient may utilize.   PULMONARY REHAB OTHER RESPIRATORY from 02/01/2018 in [place]  Date  02/01/18  Educator  RN  Instruction Review Code  2- Demonstrated Understanding      Oxygen Equipment:  -Group instruction provided by [facility] Staff utilizing handouts, written materials, and equipment demonstrations.   PULMONARY REHAB OTHER RESPIRATORY from 02/01/2018 in [facility]  [place]  Date  11/23/17  Educator  [facility]      Signs and Symptoms:  -Group instruction provided by written material and verbal discussion to support subject matter. Warning signs and symptoms of infection, stroke, and heart attack are reviewed and when to call the physician/911 reinforced. Tips for preventing the spread of infection discussed.   Advanced Directives:  -Group instruction provided by verbal instruction and written material to support subject matter. Instructor reviews Advanced Directive laws and proper instruction for filling out document.   Pulmonary Video:  -Group video education that reviews the importance of medication and oxygen compliance, exercise, good nutrition, pulmonary hygiene, and pursed lip and diaphragmatic breathing for the pulmonary patient.   Exercise for the Pulmonary Patient:  -Group instruction that is supported by a PowerPoint presentation. Instructor discusses benefits of exercise, core components of exercise, frequency, duration, and intensity of an exercise routine, importance of utilizing pulse oximetry during exercise, safety  while exercising, and options of places to exercise outside of rehab.     Pulmonary Medications:  -Verbally interactive group education provided by instructor with focus on inhaled medications and proper administration.   Anatomy and Physiology of the Respiratory System and Intimacy:  -Group instruction provided by PowerPoint, verbal discussion, and written material to support subject matter. Instructor reviews respiratory cycle and anatomical components of the respiratory system and their functions. Instructor also reviews differences in obstructive and restrictive respiratory diseases with examples of each. Intimacy, Sex, and Sexuality differences are reviewed with a discussion on how relationships can change when diagnosed with pulmonary disease. Common sexual concerns are reviewed.   PULMONARY REHAB OTHER RESPIRATORY from 02/01/2018 in [place]  Date  12/14/17  Educator  RN      MD DAY -A group question and answer session with a medical doctor that allows participants to ask questions that relate to their pulmonary disease state.   PULMONARY REHAB OTHER RESPIRATORY from 02/01/2018 in [place]  Date  01/04/18  Educator  [place] -Group or individual verbal, written, or video instructions that support the educational goals of the pulmonary rehab program.   Holiday Eating Survival Tips:  -Group instruction provided by PowerPoint slides, verbal discussion, and written materials to support subject matter. The instructor gives patients tips, tricks, and techniques to help them not only survive but enjoy the holidays despite the onslaught of food that accompanies the holidays.   Knowledge Questionnaire Score: Knowledge Questionnaire Score - 11/09/17 1423      Knowledge Questionnaire Score   Pre Score  15/18       Core Components/Risk Factors/Patient Goals at Admission: Personal Goals and Risk  Factors at Admission - 10/30/17 1322      Core Components/Risk Factors/Patient Goals on Admission   Improve shortness of breath with ADL's  Yes    Intervention  Provide education, individualized exercise plan and daily activity instruction to help decrease symptoms of SOB with activities of daily living.    Expected Outcomes  Short Term: Achieves a reduction of symptoms when performing activities of daily living.    Develop more efficient breathing techniques such as purse lipped breathing and diaphragmatic breathing; and practicing self-pacing with activity  Yes    Intervention  Provide education, demonstration and support about specific breathing techniuqes utilized for more efficient breathing. Include techniques such as pursed lipped breathing, diaphragmatic breathing and self-pacing activity.    Expected  Outcomes  Short Term: Participant will be able to demonstrate and use breathing techniques as needed throughout daily activities.    Increase knowledge of respiratory medications and ability to use respiratory devices properly   Yes    Intervention  Provide education and demonstration as needed of appropriate use of medications, inhalers, and oxygen therapy.    Expected Outcomes  Short Term: Achieves understanding of medications use. Understands that oxygen is a medication prescribed by physician. Demonstrates appropriate use of inhaler and oxygen therapy.       Core Components/Risk Factors/Patient Goals Review:  Goals and Risk Factor Review    Row Name 11/09/17 1614 11/28/17 1254 11/28/17 1256 12/21/17 0714 12/21/17 0716     Core Components/Risk Factors/Patient Goals Review   Personal Goals Review  Improve shortness of breath with ADL's;Develop more efficient breathing techniques such as purse lipped breathing and diaphragmatic breathing and practicing self-pacing with activity.;Increase knowledge of respiratory medications and ability to use respiratory devices properly.  Improve shortness  of breath with ADL's;Develop more efficient breathing techniques such as purse lipped breathing and diaphragmatic breathing and practicing self-pacing with activity.;Increase knowledge of respiratory medications and ability to use respiratory devices properly.  -  Improve shortness of breath with ADL's;Develop more efficient breathing techniques such as purse lipped breathing and diaphragmatic breathing and practicing self-pacing with activity.;Increase knowledge of respiratory medications and ability to use respiratory devices properly.  -   Review  patient has only attended 1 exercise session since admission and too soon to evaluate progress towards goals  patient has attended 5 session since admission and states she is seeing an improvement in her stamina and strenght during exertion in pulm rehab. She feels this is related to oxygen use. She has not recieved home oxygen as of today. She is awaiting delivery. She feels once she has oxygen for home exertional activities she will see an improvement in her shortness of breath. She still need coaching in PLB.  -  patient is progressing well in pulmonary rehab. She is continueing to see an improvement in her stamina and strength especially now that she has home O2. She is observed using PLB more effeciently and has to be reminded less while exercising.  -   Expected Outcomes  -  -  see admission outcomes  -  see admission outcomes   Row Name 01/15/18 1420 02/05/18 1645           Core Components/Risk Factors/Patient Goals Review   Personal Goals Review  Improve shortness of breath with ADL's;Develop more efficient breathing techniques such as purse lipped breathing and diaphragmatic breathing and practicing self-pacing with activity.;Increase knowledge of respiratory medications and ability to use respiratory devices properly.  Improve shortness of breath with ADL's;Develop more efficient breathing techniques such as purse lipped breathing and diaphragmatic  breathing and practicing self-pacing with activity.;Increase knowledge of respiratory medications and ability to use respiratory devices properly.      Review  patient is progressing well in pulmonary rehab. She is continuing to see an improvement in her stamina and strength with using O2. She is observed using PLB more effeciently and has to be reminded less while exercising.  patient continues to progress well in pulmonary rehab. she missed from 1/15-2/5 related to an upper respiratory illness. She has "bounced back" nicely according to patient. She is continuing to see an improvement in her stamina and strength with using O2. She is observed using PLB more effeciently and has to be reminded less while exercising.  Expected Outcomes  see admission outcomes  see admission outcomes         Core Components/Risk Factors/Patient Goals at Discharge (Final Review):  Goals and Risk Factor Review - 02/05/18 1645      Core Components/Risk Factors/Patient Goals Review   Personal Goals Review  Improve shortness of breath with ADL's;Develop more efficient breathing techniques such as purse lipped breathing and diaphragmatic breathing and practicing self-pacing with activity.;Increase knowledge of respiratory medications and ability to use respiratory devices properly.    Review  patient continues to progress well in pulmonary rehab. she missed from 1/15-2/5 related to an upper respiratory illness. She has "bounced back" nicely according to patient. She is continuing to see an improvement in her stamina and strength with using O2. She is observed using PLB more effeciently and has to be reminded less while exercising.    Expected Outcomes  see admission outcomes       ITP Comments:   Comments: patient has attended 16 sessions since admission

## 2018-02-08 ENCOUNTER — Encounter (HOSPITAL_COMMUNITY)
Admission: RE | Admit: 2018-02-08 | Discharge: 2018-02-08 | Disposition: A | Discharge status: Home / Self Care | Payer: Medicare Other | Source: Ambulatory Visit | Attending: Internal Medicine | Admitting: Internal Medicine

## 2018-02-08 DIAGNOSIS — J849 Interstitial pulmonary disease, unspecified: Secondary | ICD-10-CM | POA: Diagnosis not present

## 2018-02-08 DIAGNOSIS — Z9889 Other specified postprocedural states: Secondary | ICD-10-CM

## 2018-02-08 NOTE — Progress Notes (Signed)
Daily Session Note  Patient Details  Name: Patty [name] MRN: [id number] Date of Birth: [birth year] Referring Provider:     Pulmonary Rehab Walk Test from 11/02/2017 in [place]  Referring Provider  Dr.Ramaswamy      Encounter Date: 02/08/2018  Check In: Session Check In - 02/08/18 1053      Check-In   Location  [facility]    Staff Present  Trish Fountain, RN, BSN;Lisa Ysidro Evert, RN;Molly diVincenzo, MS, ACSM RCEP, Exercise Physiologist    Supervising physician immediately available to respond to emergencies  [facility] immediately available    Physician(s)  Dr. Tyrell Antonio    Medication changes reported      No    Fall or balance concerns reported     No    Tobacco Cessation  No Change    Warm-up and Cool-down  Performed as group-led instruction    Resistance Training Performed  Yes    VAD Patient?  No      Pain Assessment   Currently in Pain?  No/denies    Multiple Pain Sites  No       Capillary Blood Glucose: No results found for this or any previous visit (from the past 24 hour(s)).    Social History   Tobacco Use  Smoking Status Former Smoker  . Packs/day: 1.00  . Years: 45.00  . Pack years: 45.00  . Types: Cigarettes  . Last attempt to quit: 12/26/2008  . Years since quitting: 9.1  Smokeless Tobacco Never Used    Goals Met:  Improved SOB with ADL's Using PLB without cueing & demonstrates good technique Exercise tolerated well No report of cardiac concerns or symptoms Strength training completed today  Goals Unmet:  Not Applicable  Comments: Service time is from 1030 to 1235   Dr. Rush Farmer is Medical Director for Pulmonary Rehab at [facility].

## 2018-02-13 ENCOUNTER — Encounter: Payer: Self-pay | Admitting: Internal Medicine

## 2018-02-13 ENCOUNTER — Ambulatory Visit (INDEPENDENT_AMBULATORY_CARE_PROVIDER_SITE_OTHER): Payer: Medicare Other | Admitting: Internal Medicine

## 2018-02-13 ENCOUNTER — Encounter (HOSPITAL_COMMUNITY)
Admission: RE | Admit: 2018-02-13 | Discharge: 2018-02-13 | Disposition: A | Discharge status: Home / Self Care | Payer: Medicare Other | Source: Ambulatory Visit | Attending: Internal Medicine | Admitting: Internal Medicine

## 2018-02-13 VITALS — BP 128/78 | HR 93 | Ht 64.5 in | Wt 253.0 lb

## 2018-02-13 DIAGNOSIS — J849 Interstitial pulmonary disease, unspecified: Secondary | ICD-10-CM

## 2018-02-13 DIAGNOSIS — Z23 Encounter for immunization: Secondary | ICD-10-CM

## 2018-02-13 NOTE — Progress Notes (Signed)
Subjective:     Patient ID: Patty [name], female   DOB: 11/22/1951, 67 y.o.   MRN: [id number]  HPI   _0  ID: Patty [name], female    DOB: [birth year], 67 y.o.   MRN: [id number]  Chief Complaint  Patient presents with  . Follow-up    dyspnea     Referring provider: No ref. provider found  HPI: 67 year old female former smoker seen for pulmonary consulokay guarded because then maybe they can meet him right now for the registryt 05/09/2017 for progressive dyspnea since 2004.[name] Patient has polycythemia vera followed by hematology and [place] She has Crohn's disease    IOV  05/09/2017  Chief Complaint  Patient presents with  . Pulmonary Consult    Pt referred for SOB with activity x years. Pt states over the last year the DOE has worsened. Pt denies cough and CP/tightness and f/c/s.     67 year old obese female. In 2004 Started noticing insidious onset of shortness of breath. In 2005 more [place] from [place] and probably gain over 60 pounds of weight. She has extensive workup at that time according to her history. She was then diagnosed with polycythemia by Dr. [name] at [facility]. She does not recollect any phlebotomies for this. However in the last 4-5 years she's had worsening shortness of breath. Noticeable with exertion particularly in the gym and doing standing exercises but not so much with sitting exercises. Relieved by rest. When she does some yard work she notices some associated wheezing as well.  Walking desaturation test in the office she did desaturate to 88% and did get tachycardic. She says that a chest x-ray done by primary care physicians interstitial pulmonary fibrosis. I personally visualized the chest x-ray done 04/03/2017: There is some interstitial markings but it is obscured by her obesity. I'm not so certain that is definite ILD. There is no lab work in the record. There is no PFT a CT scan available for visualization     05/29/2017 Follow up :  Dyspnea  Patient returns for a two-week follow-up. Patient was seen for pulmonary consult 05/09/2017 for progressive dyspnea since 2004. Patient was set up for a high resolution CT chest that showed a very mild basilar predominant subpleural reticulation and bronchiectasis which could be due to nonspecific interstitial pneumonitis.. CT did have incidental findings of enlarged pulmonary arteries and aortic and coronary calcification. Patient had pulmonary function test on May 23 that showed an FEV1 at 77%, ratio 73, FVC 81, no significant bronchodilator response, total lung capacity 100%, DLCO 52%. Overnight oximetry test did show significant desaturations. We discussed beginning oxygen at bedtime.  Patient denies significant cough. Says that she get short of breath with walking. Has daytime fatigue and low energy..  Interstitial lung disease patient questionnaire was completed as follows Patient has no previous use of Macrodantin, amiodarone, methotrexate. She has been treated with prednisone for her Crohn's in the past. Patient has had extensive travel with brief visits to multiple state and country's.. [place]. Did live in a apartment that she did notice mold for 4 years. She does have Crohn's disease and was on Mesamaline for many years. Patient says she has had a humidifier and hot tub and previous house. She has no indoor birds. She does have a dog. Patient says she was told that she had a blood clot in the past but was never found.   OV 08/15/2017  Chief Complaint  Patient presents with  .  Follow-up    Pt still gets occ. SOB. Other than that, pt states that she has been doing good. Denies any cough or CP.     Follow-up multifactorial dyspnea with interstitial lung disease concern.   Regarding interstitial lung disease concern: Mid May 2018 she had high resolution CT chest that showed interstitial lung disease very mild but unclear if possible UIP pattern. Autoimmune test  was negative. Isolated reduction in diffusion capacity 50%. [employer] of chest physicians questionnaire shows previous Crohn's disease and also mold exposure previously. Overall she feels stable  Regarding dyspnea: She underwent echocardiogram in June 2018 showed grade 1 diastolic dysfunction. Had abnormal cardiac stress test July 2018 followed by cardiac cath early August 2018 that showed normal coronary angiogram but elevated left ventricular end-diastolic pressure. Dietary therapy has been recommended but she is unaware of these results.  Overall she is here to discuss these results.  IMPRESSION: 1. Suspect very mild basilar predominant subpleural reticulation and bronchiolectasis, which may be due to nonspecific interstitial pneumonitis. 2. Aortic atherosclerosis (ICD10-170.0). Coronary artery calcification. 3. Enlarged pulmonary arteries, indicative of pulmonary arterial hypertension.   Electronically Signed   By: Lorin Picket M.D.   On: 05/13/2017 07:33   OV 10/17/2017  Chief Complaint  Patient presents with  . Follow-up    Pt states that she has had good days and bad days since last visit. States that when she was in [place] for a month, breathing was better; still became SOB but not as bad as in [place]. Pt's SOB is mainly on exertion. Denies any cough or CP.    Follow-up dyspnea that is multifactorial due to obesity, physical deconditioning and diastolic dysfunction Follow-up mild interstitial lung disease not otherwise specified  Last visit I started her on Lasix. I was only supposed to see herin 6 months or so. However she's had problems tolerating Lasix and potassium. It fluctuates between palpitations and hypertension. She feels the palpitations might be related to potassium depletion after taking Lasix. But she also tells me that the Lasix does help her dyspnea. At this point in time her condition is to see cardiologist. Off note she did send the email message  asking these questions on 09/28/2017 made a reply that she tells me that she never got the reply.   OV 02/13/2018  Chief Complaint  Patient presents with  . Follow-up    PFT done 02/05/18.  Pt states she has been doing good. Was sick in january but states she is doing better.  DME: [facility] 4L pulse    Follow-up dyspnea that is multifactorial due to obesity, physical deconditioning and diastolic dysfunction Follow-up mild interstitial lung disease not otherwise specified v concern   67 year old female with concern for interstitial lung disease.  Since her last visit she continues to do well.  She is attending pulmonary rehabilitation.  She feels better and less short of breath.  She uses oxygen with exertion at rehab saying she needs it.  She has upcoming travel in summer to [place] and wanted some flight information oxygen form filled out.  She is wondering if mesalamine that she took several years ago was the cause of interstitial lung disease or her Crohn's disease.  But overall she is stable.  Pulmonary function test shows stability and FVC since last 1 year with some improvement in DLCO.  High-resolution CT scan of the chest interpreted by thoracic radiology report suspected ILD.  Results for DESTENY, FREEMAN (MRN [id number]) as of 02/13/2018 12:41  Ref.  Range 05/17/2017 11:47 02/05/2018 12:07  FVC-Pre Latest Units: L 2.61 2.57  FVC-%Pred-Pre Latest Units: % 84 83    Results for DENALY, GATLING (MRN [id number]) as of 02/13/2018 12:41  Ref. Range 05/17/2017 11:47 02/05/2018 12:07  DLCO unc Latest Units: ml/min/mmHg 12.64 15.15  DLCO unc % pred Latest Units: % 52 62   IMPRESSION: 1. Suspect mild basilar subpleural reticulation and ground-glass, as on 05/12/2017, which may be due to nonspecific interstitial pneumonitis. Usual interstitial pneumonitis is not excluded. 2. Aortic atherosclerosis (ICD10-170.0). Coronary artery calcification. 3. Enlarged pulmonary arteries, indicative of pulmonary  arterial hypertension.   Electronically Signed   By: Lorin Picket M.D.   On: 01/29/2018 15:25      has a past medical history of Abnormal nuclear stress test, Anemia, Crohn disease ([place]), OSA on CPAP, and Polycythemia vera ([place]).   reports that she quit smoking about 9 years ago. Her smoking use included cigarettes. She has a 45.00 pack-year smoking history. she has never used smokeless tobacco.  Past Surgical History:  Procedure Laterality Date  . COLON RESECTION    . HIP ARTHROPLASTY    . LEFT HEART CATH AND CORONARY ANGIOGRAPHY N/A 07/28/2017   Procedure: Left Heart Cath and Coronary Angiography;  Surgeon: Burnell Blanks, MD;  Location: [facility];  Service: Cardiovascular;  Laterality: N/A;    Allergies  Allergen Reactions  . Lasix [Furosemide] Itching    Hypersensitivity   . Onion Other (See Comments)    sereve diarrhea, stomach pains (Crohn's flares)  . Oxycodone Other (See Comments)    Altered mental status  . Potassium-Containing Compounds Other (See Comments)    Due to chron's, difficult passing though kidneys not allowing absorption in the body   . Prednisone Other (See Comments)    Altered mood  . Sulfa Drugs Cross Reactors Other (See Comments)    "feels like bugs crawling on me"     There is no immunization history on file for this patient.  Family History  Problem Relation Age of Onset  . Glaucoma Mother   . Diabetes Mellitus II Father   . COPD Brother   . Heart disease Brother   . Non-Hodgkin's lymphoma Brother   . Diabetes Mellitus II Brother   . Glaucoma Brother      Current Outpatient Medications:  .  amoxicillin-clavulanate (AUGMENTIN) 875-125 MG tablet, Take 1 tablet by mouth 2 (two) times daily., Disp: 14 tablet, Rfl: 0 .  aspirin EC 81 MG tablet, Take 81 mg by mouth daily., Disp: , Rfl:  .  B Complex-C (B-COMPLEX WITH VITAMIN C) tablet, Take 1 tablet by mouth daily., Disp: , Rfl:  .  Biotin 5000 MCG SUBL, Place 5,000  mcg under the tongue daily., Disp: , Rfl:  .  Cetirizine HCl (ZYRTEC ALLERGY) 10 MG CAPS, TAKE 1 TABLET DAILY ALONG WITH THE LASIX, Disp: 90 capsule, Rfl: 3 .  Cetirizine-Pseudoephedrine (ZYRTEC-D PO), Take 0.5-1 tablets by mouth daily as needed (for sinus congestion/allergies.). , Disp: , Rfl:  .  Cholecalciferol (VITAMIN D) 2000 units tablet, Take 2,000 Units by mouth daily., Disp: , Rfl:  .  Chromium 500 MCG TABS, Take 500 mcg by mouth every 3 (three) days., Disp: , Rfl:  .  clonazePAM (KLONOPIN) 0.5 MG tablet, Take 0.125-0.25 mg by mouth daily as needed (for anxiety or sleep.)., Disp: , Rfl:  .  cyanocobalamin (,VITAMIN B-12,) 1000 MCG/ML injection, Inject 1,000 mcg into the muscle every 30 (thirty) days. Pernicious anemia, Disp: , Rfl:  .  Cyanocobalamin (VITAMIN B-12) 5000 MCG SUBL, Place 5,000 mcg under the tongue daily., Disp: , Rfl:  .  cyclopentolate (CYCLODRYL,CYCLOGYL) 2 % ophthalmic solution, Place 1 drop into both eyes 3 (three) times daily as needed (for uveitis or iritis)., Disp: , Rfl:  .  fluticasone (FLONASE) 50 MCG/ACT nasal spray, , Disp: , Rfl:  .  furosemide (LASIX) 20 MG tablet, Take 1 tablet (20 mg total) by mouth daily., Disp: 90 tablet, Rfl: 3 .  Magnesium 250 MG TABS, Take 250 mg by mouth every other day., Disp: , Rfl:  .  Misc Natural Products (FOCUSED MIND PO), Take 1 tablet by mouth every 3 (three) days. FOCUS FACTOR NUTRITIONAL SUPPLEMENT, Disp: , Rfl:  .  Multiple Vitamins-Minerals (ICAPS AREDS 2 PO), Take 1 tablet by mouth every 3 (three) days. , Disp: , Rfl:  .  Niacinamide-Zinc-Copper-FA (NICOTINAMIDE ZCF PO), Take 1 capsule by mouth daily. Nicotinamide Riboside-Pterostilbene 250-50 mg ([employer]), Disp: , Rfl:  .  OVER THE COUNTER MEDICATION, Place 1 each under the tongue daily. 3-4 DROP DAILY (CBD OIL), Disp: , Rfl:  .  potassium chloride (MICRO-K) 10 MEQ CR capsule, Take 1 capsule (10 mEq total) by mouth daily., Disp: 90 capsule, Rfl:  3 .  prednisoLONE acetate (PRED FORTE) 1 % ophthalmic suspension, Place 1 drop into both eyes 4 (four) times daily as needed (for iritis flare ups (inflammation))., Disp: , Rfl:  .  Probiotic Product (PROBIOTIC PO), Take 1 capsule by mouth once a week., Disp: , Rfl:  .  traMADol-acetaminophen (ULTRACET) 37.5-325 MG tablet, Take 0.5-1 tablets by mouth every 6 (six) hours as needed (for pain.)., Disp: , Rfl:  .  traZODone (DESYREL) 50 MG tablet, Take 25-50 mg by mouth at bedtime as needed for sleep., Disp: , Rfl:  .  triprolidine-pseudoephedrine (WAL-ACT) 2.5-60 MG TABS tablet, Take 1 tablet by mouth every 6 (six) hours as needed for allergies., Disp: , Rfl:  .  TURMERIC PO, Take 1 capsule by mouth every other day. CURAMIN, Disp: , Rfl:    Review of Systems     Objective:   Physical Exam  Constitutional: She is oriented to person, place, and time. She appears well-developed and well-nourished. No distress.  HENT:  Head: Normocephalic and atraumatic.  Right Ear: External ear normal.  Left Ear: External ear normal.  Mouth/Throat: Oropharynx is clear and moist. No oropharyngeal exudate.  Eyes: Conjunctivae and EOM are normal. Pupils are equal, round, and reactive to light. Right eye exhibits no discharge. Left eye exhibits no discharge. No scleral icterus.  Neck: Normal range of motion. Neck supple. No JVD present. No tracheal deviation present. No thyromegaly present.  Cardiovascular: Normal rate, regular rhythm, normal heart sounds and intact distal pulses. Exam reveals no gallop and no friction rub.  No murmur heard. Pulmonary/Chest: Effort normal and breath sounds normal. No respiratory distress. She has no wheezes. She has no rales. She exhibits no tenderness.  Given her obesity is very hard to discern if there are basal crackles  Abdominal: Soft. Bowel sounds are normal. She exhibits no distension and no mass. There is no tenderness. There is no rebound and no guarding.  Musculoskeletal:  Normal range of motion. She exhibits no edema or tenderness.  Lymphadenopathy:    She has no cervical adenopathy.  Neurological: She is alert and oriented to person, place, and time. She has normal reflexes. No cranial nerve deficit. She exhibits normal muscle tone. Coordination normal.  Skin: Skin is warm and  dry. No rash noted. She is not diaphoretic. No erythema. No pallor.  Psychiatric: She has a normal mood and affect. Her behavior is normal. Judgment and thought content normal.  Vitals reviewed.  Vitals:   02/13/18 1226  BP: 128/78  Pulse: 93  SpO2: 91%  Weight: 253 lb (114.8 kg)  Height: 5' 4.5" (1.638 m)       Assessment:       ICD-10-CM   1. ILD (interstitial lung disease) ([place]) J84.9 Pulmonary function test    Possible likely ILD NOS    Plan:      Lung disease is stable ? Related to crohns; cause not known [facility] went well (noted [address]L [place] with exertion at rehab)  Plan - prevnar 02/13/2018  - continue expectant approach with o2 during exertion   Followup - in 6 months do Pre-bd spiro and dlco only. No lung volume or bd response. No post-bd spiro - return to see Dr Chase Caller in 6 months or sooner if neeed    Dr. Brand Males, M.D., [facility].C.P Pulmonary and Critical Care Medicine Staff Physician, [place] Director - Interstitial Lung Disease  Program  Pulmonary [place] [facility] [place], [place], [address]  Pager: [phone], If no answer or between  15:00h - 7:00h: call [phone] Telephone: [phone]

## 2018-02-13 NOTE — Patient Instructions (Addendum)
ICD-10-CM   1. ILD (interstitial lung disease) ([place]) J84.9    Lung disease is stable ? Related to crohns; cause not known [facility]hab went well (noted 4L [place] with exertion at rehab)  Plan - prevnar 02/13/2018  - continue expectant approach with o2 during exertion   Followup - in 6 months do Pre-bd spiro and dlco only. No lung volume or bd response. No post-bd spiro - return to see Dr Chase Caller in 6 months or sooner if neeed

## 2018-02-15 ENCOUNTER — Encounter (HOSPITAL_COMMUNITY): Payer: Medicare Other

## 2018-02-15 ENCOUNTER — Encounter (HOSPITAL_COMMUNITY): Payer: Self-pay | Admitting: *Deleted

## 2018-02-20 ENCOUNTER — Encounter (HOSPITAL_COMMUNITY): Payer: Medicare Other

## 2018-02-24 ENCOUNTER — Encounter (HOSPITAL_COMMUNITY): Payer: Self-pay

## 2018-02-24 DIAGNOSIS — J849 Interstitial pulmonary disease, unspecified: Secondary | ICD-10-CM

## 2018-02-24 NOTE — Progress Notes (Signed)
Discharge Progress Report  Patient Details  Name: Patty [name] MRN: [id number] Date of Birth: 1951-10-13 Referring Provider:     Pulmonary Rehab Walk Test from 11/02/2017 in [place]  Referring Provider  Dr.Ramaswamy       Number of Visits: 18  Reason for Discharge:  Patient reached a stable level of exercise. Patient independent in their exercise. Patient has met program and personal goals.  Smoking History:  Social History   Tobacco Use  Smoking Status Former Smoker  . Packs/day: 1.00  . Years: 45.00  . Pack years: 45.00  . Types: Cigarettes  . Last attempt to quit: 12/26/2008  . Years since quitting: 9.1  Smokeless Tobacco Never Used    Diagnosis:  Interstitial pulmonary disease ([place])  ADL UCSD: Pulmonary Assessment Scores    Row Name 02/13/18 1309 02/15/18 1221       ADL UCSD   ADL Phase  Exit  Exit    SOB Score total  -  36      CAT Score   CAT Score  -  16 Exit      mMRC Score   mMRC Score  3  -       Initial Exercise Prescription:   Discharge Exercise Prescription (Final Exercise Prescription Changes): Exercise Prescription Changes - 01/30/18 1200      Response to Exercise   Blood Pressure (Admit)  118/70    Blood Pressure (Exercise)  130/70    Blood Pressure (Exit)  112/60    Heart Rate (Admit)  98 bpm    Heart Rate (Exercise)  122 bpm HR up on track, encouraged rest stops sooner    Heart Rate (Exit)  103 bpm    Oxygen Saturation (Admit)  94 %    Oxygen Saturation (Exercise)  94 %    Oxygen Saturation (Exit)  92 %    Rating of Perceived Exertion (Exercise)  13    Perceived Dyspnea (Exercise)  2    Duration  Progress to 45 minutes of aerobic exercise without signs/symptoms of physical distress    Intensity  THRR unchanged      Progression   Progression  Continue to progress workloads to maintain intensity without signs/symptoms of physical distress.      Resistance Training   Training Prescription   Yes    Weight  blue bands    Reps  10-15    Time  10 Minutes      Oxygen   Oxygen  Continuous    Liters  2.4      NuStep   Level  6    Minutes  17    METs  2      Arm Ergometer   Level  4.5    Minutes  17      Track   Laps  11    Minutes  17       Functional Capacity: 6 Minute Walk    Row Name 02/13/18 1306         6 Minute Walk   Phase  Discharge     Distance  1200 feet     Distance Feet Change  100 ft     Walk Time  6 minutes     # of Rest Breaks  0     MPH  2.27     METS  2.76     RPE  15     Perceived Dyspnea   3  Symptoms  No     Resting HR  103 bpm     Resting BP  144/74     Resting Oxygen Saturation   96 %     Exercise Oxygen Saturation  during 6 min walk  88 %     Max Ex. HR  159 bpm     Max Ex. BP  144/82       Interval HR   1 Minute HR  114     2 Minute HR  114     3 Minute HR  134     4 Minute HR  145     5 Minute HR  140     6 Minute HR  159     2 Minute Post HR  122     Interval Heart Rate?  Yes       Interval Oxygen   Interval Oxygen?  Yes     Baseline Oxygen Saturation %  96 %     1 Minute Oxygen Saturation %  91 %     1 Minute Liters of Oxygen  4 L     2 Minute Oxygen Saturation %  91 %     2 Minute Liters of Oxygen  4 L     3 Minute Oxygen Saturation %  92 %     3 Minute Liters of Oxygen  4 L     4 Minute Oxygen Saturation %  89 %     4 Minute Liters of Oxygen  4 L     5 Minute Oxygen Saturation %  88 %     5 Minute Liters of Oxygen  4 L     6 Minute Oxygen Saturation %  89 %     6 Minute Liters of Oxygen  4 L     2 Minute Post Oxygen Saturation %  97 %     2 Minute Post Liters of Oxygen  4 L        Psychological, QOL, Others - Outcomes: PHQ 2/9: Depression screen PHQ 2/9 10/30/2017  Decreased Interest 0  Down, Depressed, Hopeless 1  PHQ - 2 Score 1    Quality of Life:   Personal Goals: Goals established at orientation with interventions provided to work toward goal.    Personal Goals Discharge: Goals and  Risk Factor Review    Row Name 01/15/18 1420 02/05/18 1645 02/24/18 1502         Core Components/Risk Factors/Patient Goals Review   Personal Goals Review  Improve shortness of breath with ADL's;Develop more efficient breathing techniques such as purse lipped breathing and diaphragmatic breathing and practicing self-pacing with activity.;Increase knowledge of respiratory medications and ability to use respiratory devices properly.  Improve shortness of breath with ADL's;Develop more efficient breathing techniques such as purse lipped breathing and diaphragmatic breathing and practicing self-pacing with activity.;Increase knowledge of respiratory medications and ability to use respiratory devices properly.  Improve shortness of breath with ADL's;Develop more efficient breathing techniques such as purse lipped breathing and diaphragmatic breathing and practicing self-pacing with activity.;Increase knowledge of respiratory medications and ability to use respiratory devices properly.     Review  patient is progressing well in pulmonary rehab. She is continuing to see an improvement in her stamina and strength with using O2. She is observed using PLB more effeciently and has to be reminded less while exercising.  patient continues to progress well in pulmonary rehab. she missed from 1/15-2/5 related to an upper  respiratory illness. She has "bounced back" nicely according to patient. She is continuing to see an improvement in her stamina and strength with using O2. She is observed using PLB more effeciently and has to be reminded less while exercising.  all goals met at discharge     Expected Outcomes  see admission outcomes  see admission outcomes  see admission outcomes        Exercise Goals and Review:   Nutrition & Weight - Outcomes:    Nutrition:   Nutrition Discharge: Nutrition Assessments - 02/21/18 1047      Rate Your Plate Scores   Pre Score  51    Post Score  49       Education  Questionnaire Score: Knowledge Questionnaire Score - 02/15/18 1221      Knowledge Questionnaire Score   Post Score  13/13       Goals reviewed with patient; copy given to patient.

## 2018-03-13 ENCOUNTER — Encounter (INDEPENDENT_AMBULATORY_CARE_PROVIDER_SITE_OTHER): Payer: Self-pay

## 2018-04-05 ENCOUNTER — Ambulatory Visit (INDEPENDENT_AMBULATORY_CARE_PROVIDER_SITE_OTHER): Payer: Medicare Other | Admitting: Family Medicine

## 2018-04-05 ENCOUNTER — Encounter (INDEPENDENT_AMBULATORY_CARE_PROVIDER_SITE_OTHER): Payer: Self-pay | Admitting: Family Medicine

## 2018-04-05 VITALS — BP 116/78 | HR 75 | Temp 97.8°F | Ht 63.0 in | Wt 251.0 lb

## 2018-04-05 DIAGNOSIS — Z6841 Body Mass Index (BMI) 40.0 and over, adult: Secondary | ICD-10-CM | POA: Diagnosis not present

## 2018-04-05 DIAGNOSIS — R0602 Shortness of breath: Secondary | ICD-10-CM

## 2018-04-05 DIAGNOSIS — Z1331 Encounter for screening for depression: Secondary | ICD-10-CM | POA: Diagnosis not present

## 2018-04-05 DIAGNOSIS — K50818 Crohn's disease of both small and large intestine with other complication: Secondary | ICD-10-CM

## 2018-04-05 DIAGNOSIS — R5383 Other fatigue: Secondary | ICD-10-CM | POA: Diagnosis not present

## 2018-04-05 DIAGNOSIS — E559 Vitamin D deficiency, unspecified: Secondary | ICD-10-CM

## 2018-04-05 DIAGNOSIS — E538 Deficiency of other specified B group vitamins: Secondary | ICD-10-CM

## 2018-04-05 DIAGNOSIS — R739 Hyperglycemia, unspecified: Secondary | ICD-10-CM

## 2018-04-05 DIAGNOSIS — D649 Anemia, unspecified: Secondary | ICD-10-CM | POA: Diagnosis not present

## 2018-04-05 DIAGNOSIS — Z0289 Encounter for other administrative examinations: Secondary | ICD-10-CM

## 2018-04-06 LAB — ANEMIA PANEL
Ferritin: 71 ng/mL (ref 15–150)
Folate, Hemolysate: 427.5 ng/mL
Folate, RBC: 981 ng/mL (ref >498)
Hematocrit: 43.6 % (ref 34.0–46.6)
Iron Saturation: 36 % (ref 15–55)
Iron: 111 ug/dL (ref 27–139)
Retic Ct Pct: 1.2 % (ref 0.6–2.6)
Total Iron Binding Capacity: 311 ug/dL (ref 250–450)
UIBC: 200 ug/dL (ref 118–369)
Vitamin B-12: >2000 pg/mL — ABNORMAL HIGH (ref 232–1245)

## 2018-04-06 LAB — CBC WITH DIFFERENTIAL
Basophils Absolute: 0 10*3/uL (ref 0.0–0.2)
Basos: 1 %
EOS (ABSOLUTE): 0.2 10*3/uL (ref 0.0–0.4)
Eos: 3 %
Hemoglobin: 14.7 g/dL (ref 11.1–15.9)
Immature Grans (Abs): 0 10*3/uL (ref 0.0–0.1)
Immature Granulocytes: 0 %
Lymphocytes Absolute: 1.6 10*3/uL (ref 0.7–3.1)
Lymphs: 22 %
MCH: 29.8 pg (ref 26.6–33.0)
MCHC: 33.7 g/dL (ref 31.5–35.7)
MCV: 88 fL (ref 79–97)
Monocytes Absolute: 0.6 10*3/uL (ref 0.1–0.9)
Monocytes: 9 %
Neutrophils Absolute: 4.7 10*3/uL (ref 1.4–7.0)
Neutrophils: 65 %
RBC: 4.94 x10E6/uL (ref 3.77–5.28)
RDW: 15.3 % (ref 12.3–15.4)
WBC: 7.1 10*3/uL (ref 3.4–10.8)

## 2018-04-06 LAB — VITAMIN D 25 HYDROXY (VIT D DEFICIENCY, FRACTURES): Vit D, 25-Hydroxy: 27.5 ng/mL — ABNORMAL LOW (ref 30.0–100.0)

## 2018-04-06 LAB — COMPREHENSIVE METABOLIC PANEL
ALT: 51 IU/L — ABNORMAL HIGH (ref 0–32)
AST: 46 IU/L — ABNORMAL HIGH (ref 0–40)
Albumin/Globulin Ratio: 1.5 (ref 1.2–2.2)
Albumin: 4 g/dL (ref 3.6–4.8)
Alkaline Phosphatase: 113 IU/L (ref 39–117)
BUN/Creatinine Ratio: 13 (ref 12–28)
BUN: 11 mg/dL (ref 8–27)
Bilirubin Total: 0.6 mg/dL (ref 0.0–1.2)
CO2: 25 mmol/L (ref 20–29)
Calcium: 9.4 mg/dL (ref 8.7–10.3)
Chloride: 100 mmol/L (ref 96–106)
Creatinine, Ser: 0.87 mg/dL (ref 0.57–1.00)
GFR calc Af Amer: 80 mL/min/{1.73_m2} (ref >59)
GFR calc non Af Amer: 69 mL/min/{1.73_m2} (ref >59)
Globulin, Total: 2.7 g/dL (ref 1.5–4.5)
Glucose: 130 mg/dL — ABNORMAL HIGH (ref 65–99)
Potassium: 3.6 mmol/L (ref 3.5–5.2)
Sodium: 141 mmol/L (ref 134–144)
Total Protein: 6.7 g/dL (ref 6.0–8.5)

## 2018-04-06 LAB — LIPID PANEL WITH LDL/HDL RATIO
Cholesterol, Total: 183 mg/dL (ref 100–199)
HDL: 54 mg/dL (ref >39)
LDL Calculated: 111 mg/dL — ABNORMAL HIGH (ref 0–99)
LDl/HDL Ratio: 2.1 ratio (ref 0.0–3.2)
Triglycerides: 92 mg/dL (ref 0–149)
VLDL Cholesterol Cal: 18 mg/dL (ref 5–40)

## 2018-04-06 LAB — T3: T3, Total: 163 ng/dL (ref 71–180)

## 2018-04-06 LAB — HEMOGLOBIN A1C
Est. average glucose Bld gHb Est-mCnc: 143 mg/dL
Hgb A1c MFr Bld: 6.6 % — ABNORMAL HIGH (ref 4.8–5.6)

## 2018-04-06 LAB — T4, FREE: Free T4: 1.13 ng/dL (ref 0.82–1.77)

## 2018-04-06 LAB — TSH: TSH: 0.821 u[IU]/mL (ref 0.450–4.500)

## 2018-04-06 LAB — INSULIN, RANDOM: INSULIN: 30.6 u[IU]/mL — ABNORMAL HIGH (ref 2.6–24.9)

## 2018-04-11 NOTE — Progress Notes (Signed)
.  Office: [phone]  /  Fax: [phone]   HPI:   Chief Complaint: OBESITY  Patty [name] (MR# [id number]) is a 67 y.o. female who presents on 04/05/2018 for obesity evaluation and treatment. Current BMI is Body mass index is 44.46 kg/m.[name] Patty [name] has struggled with obesity for years and has been unsuccessful in either losing weight or maintaining long term weight loss. Patty [name] attended our information session and states she is currently in the action stage of change and ready to dedicate time achieving and maintaining a healthier weight.   her desired weight loss is 106-111 lbs she started gaining weight in 2005-gained 60 lbs in 4 months her heaviest weight ever was 258 lbs she is a picky eater and doesn't like to eat healthier foods  she has significant food cravings issues  she skips meals frequently she is frequently drinking liquids with calories she struggles with emotional eating    Fatigue Patty [name] feels her energy is lower than it should be. This has worsened with weight gain and has not worsened recently. Patty [name] admits to daytime somnolence and  denies waking up still tired. Patient has a history of obstructive sleep apnea, with the use of CPAP. Patent has a history of symptoms of daytime fatigue and morning headache. Patient generally gets 8 hours of sleep per night, and states they generally have generally restful sleep. Snoring is present. Apneic episodes are present. Epworth Sleepiness Score is 4.  Dyspnea on exertion Anis notes increasing shortness of breath with exercising and seems to be worsening over time with weight gain. She notes getting out of breath sooner with activity than she used to. This has not gotten worse recently. Charde denies orthopnea.  Crohn's Disease Patty [name] has a history of Crohn's disease. She is not eating a specific diet and states her BM's are moderately formed and regular with some episodes of loose stool.  Vitamin D Deficiency Patty [name] has a  diagnosis of vitamin D deficiency. She is on OTC Vit D and denies nausea, vomiting or muscle weakness.  Vitamin B12 Deficiency Patty [name] has a diagnosis of B12 insufficiency and notes fatigue. Due to colon and large and small intestines resection. She is on OTC B12. She is not a vegetarian and does have a previous diagnosis of pernicious anemia. She does not have a history of weight loss surgery.   Anemia Patty [name] has a diagnosis of anemia per patient, but also has a history of polycythemia vera.  She is not on iron supplementation.    Hyperglycemia Patty [name] has a history of some elevated blood glucose readings in the past without a diagnosis of diabetes. She admits to polyphagia.  Depression Screen Patty [name]'s Food and Mood (modified PHQ-9) score was  Depression screen PHQ 2/9 04/05/2018  Decreased Interest 0  Down, Depressed, Hopeless 1  PHQ - 2 Score 1  Altered sleeping 0  Tired, decreased energy 1  Change in appetite 1  Feeling bad or failure about yourself  1  Trouble concentrating 2  Moving slowly or fidgety/restless 0  Suicidal thoughts 0  PHQ-9 Score 6  Difficult doing work/chores Not difficult at all    ALLERGIES: Allergies  Allergen Reactions  . Lasix [Furosemide] Itching    Hypersensitivity   . Onion Other (See Comments)    sereve diarrhea, stomach pains (Crohn's flares)  . Oxycodone Other (See Comments)    Altered mental status  . Potassium-Containing Compounds Other (See Comments)    Due to chron's, difficult passing though kidneys not allowing absorption in  the body   . Prednisone Other (See Comments)    Altered mood  . Sulfa Drugs Cross Reactors Other (See Comments)    "feels like bugs crawling on me"    MEDICATIONS: Current Outpatient Medications on File Prior to Visit  Medication Sig Dispense Refill  . amoxicillin-clavulanate (AUGMENTIN) 875-125 MG tablet Take 1 tablet by mouth 2 (two) times daily. (Patient taking differently: Take 1 tablet by mouth as needed. ) 14  tablet 0  . aspirin EC 81 MG tablet Take 81 mg by mouth daily.    . B Complex-C (B-COMPLEX WITH VITAMIN C) tablet Take 1 tablet by mouth daily.    . Biotin 5000 MCG SUBL Place 5,000 mcg under the tongue daily.    . Cetirizine HCl (ZYRTEC ALLERGY) 10 MG CAPS TAKE 1 TABLET DAILY ALONG WITH THE LASIX 90 capsule 3  . Cholecalciferol (VITAMIN D) 2000 units tablet Take 2,000 Units by mouth daily.    . Chromium 500 MCG TABS Take 500 mcg by mouth every 3 (three) days.    . clonazePAM (KLONOPIN) 0.5 MG tablet Take 0.125-0.25 mg by mouth daily as needed (for anxiety or sleep.).    [name] cyanocobalamin (,VITAMIN B-12,) 1000 MCG/ML injection Inject 1,000 mcg into the muscle every 30 (thirty) days. Pernicious anemia    . Cyanocobalamin (VITAMIN B-12) 5000 MCG SUBL Place 5,000 mcg under the tongue daily.    . cyclopentolate (CYCLODRYL,CYCLOGYL) 2 % ophthalmic solution Place 1 drop into both eyes 3 (three) times daily as needed (for uveitis or iritis).    . fluticasone (FLONASE) 50 MCG/ACT nasal spray as needed.     . furosemide (LASIX) 20 MG tablet Take 1 tablet (20 mg total) by mouth daily. (Patient taking differently: Take 20 mg by mouth as needed. ) 90 tablet 3  . Magnesium 250 MG TABS Take 250 mg by mouth every other day.    . Multiple Vitamins-Minerals (ICAPS AREDS 2 PO) Take 1 tablet by mouth every other day.     . Niacinamide-Zinc-Copper-FA (NICOTINAMIDE ZCF PO) Take 1 capsule by mouth daily. Nicotinamide Riboside-Pterostilbene 250-50 mg ([employer])    . potassium chloride (MICRO-K) 10 MEQ CR capsule Take 1 capsule (10 mEq total) by mouth daily. 90 capsule 3  . Probiotic Product (PROBIOTIC PO) Take 1 capsule by mouth once a week.    . traMADol-acetaminophen (ULTRACET) 37.5-325 MG tablet Take 0.5-1 tablets by mouth every 6 (six) hours as needed (for pain.).    [name] TURMERIC PO Take 1 capsule by mouth every other day. CURAMIN    . Misc Natural Products (FOCUSED MIND PO) Take 1 tablet  by mouth every 3 (three) days. FOCUS FACTOR NUTRITIONAL SUPPLEMENT    . OVER THE COUNTER MEDICATION Place 1 each under the tongue daily. 3-4 DROP DAILY (CBD OIL)     No current facility-administered medications on file prior to visit.     PAST MEDICAL HISTORY: Past Medical History:  Diagnosis Date  . Abnormal nuclear stress test    a. 07/2017: cath with no CAD  . Amyloidosis ([place])   . Anemia   . Crohn disease ([place])   . Crohn's colitis ([place])   . ILD (interstitial lung disease) ([place])   . OSA on CPAP   . Pernicious anemia   . Polycythemia vera ([place])   . Sinus problem     PAST SURGICAL HISTORY: Past Surgical History:  Procedure Laterality Date  . COLON RESECTION    . HIP ARTHROPLASTY    .  LEFT HEART CATH AND CORONARY ANGIOGRAPHY N/A 07/28/2017   Procedure: Left Heart Cath and Coronary Angiography;  Surgeon: Burnell Blanks, MD;  Location: [facility];  Service: Cardiovascular;  Laterality: N/A;    SOCIAL HISTORY: Social History   Tobacco Use  . Smoking status: Former Smoker    Packs/day: 1.00    Years: 45.00    Pack years: 45.00    Types: Cigarettes    Last attempt to quit: 12/26/2008    Years since quitting: 9.2  . Smokeless tobacco: Never Used  Substance Use Topics  . Alcohol use: Yes    Comment: occ  . Drug use: No    FAMILY HISTORY: Family History  Problem Relation Age of Onset  . Glaucoma Mother   . High blood pressure Mother   . High Cholesterol Mother   . Sleep apnea Mother   . Diabetes Mellitus II Father   . Sleep apnea Father   . Anxiety disorder Father   . COPD Brother   . Heart disease Brother   . Non-Hodgkin's lymphoma Brother   . Diabetes Mellitus II Brother   . Glaucoma Brother     ROS: Review of Systems  Constitutional: Positive for malaise/fatigue. Negative for weight loss.       + Trouble sleeping  HENT: Positive for sinus pain and tinnitus.        + Decreased hearing + Nasal stuffiness  Eyes:       + Vision changes +  Wear glasses or contacts + Floaters  Respiratory: Positive for shortness of breath (with exertion).   Cardiovascular: Negative for orthopnea.       + Chest tightness + Difficulty breathing while lying down + Very cold feet or hands  Gastrointestinal: Negative for nausea and vomiting.  Musculoskeletal:       Negative muscle weakness + Neck-Swollen glands + Muscle or joint pain + Muscle stiffness  Skin: Positive for itching.       + Dryness + Hair or nail changes  Endo/Heme/Allergies:       Positive polyphagia + Heat/cold intolerance  Psychiatric/Behavioral: Positive for depression. Negative for suicidal ideas.    PHYSICAL EXAM: Blood pressure 116/78, pulse 75, temperature 97.8 F (36.6 C), temperature source Oral, height _0  (1.6 m), weight 251 lb (113.9 kg), SpO2 98 %. Body mass index is 44.46 kg/m. Physical Exam  Constitutional: She is oriented to person, place, and time. She appears well-developed and well-nourished.  HENT:  Head: Normocephalic and atraumatic.  Nose: Nose normal.  Eyes: EOM are normal. No scleral icterus.  Neck: Normal range of motion. Neck supple. No thyromegaly present.  Cardiovascular: Normal rate and regular rhythm.  Pulmonary/Chest: Effort normal. No respiratory distress.  Abdominal: Soft. There is no tenderness.  + Obesity  Musculoskeletal:  Range of Motion normal in all 4 extremities Trace edema noted in bilateral lower extremities  Neurological: She is alert and oriented to person, place, and time. Coordination normal.  Skin: Skin is warm and dry.  Psychiatric: She has a normal mood and affect. Her behavior is normal.  Vitals reviewed.   RECENT LABS AND TESTS: BMET    Component Value Date/Time   NA 141 04/05/2018 1009   K 3.6 04/05/2018 1009   CL 100 04/05/2018 1009   CO2 25 04/05/2018 1009   GLUCOSE 130 (H) 04/05/2018 1009   GLUCOSE 119 (H) 07/29/2017 0553   BUN 11 04/05/2018 1009   CREATININE 0.87 04/05/2018 1009   CALCIUM 9.4  04/05/2018  [place] 04/05/2018 1009   GFRAA 80 04/05/2018 1009   Lab Results  Component Value Date   HGBA1C 6.6 (H) 04/05/2018   Lab Results  Component Value Date   INSULIN 30.6 (H) 04/05/2018   CBC    Component Value Date/Time   WBC 7.1 04/05/2018 1009   RBC 4.94 04/05/2018 1009   HGB 14.7 04/05/2018 1009   HCT 43.6 04/05/2018 1009   PLT 342 07/25/2017 1544   MCV 88 04/05/2018 1009   MCH 29.8 04/05/2018 1009   MCHC 33.7 04/05/2018 1009   RDW 15.3 04/05/2018 1009   LYMPHSABS 1.6 04/05/2018 1009   EOSABS 0.2 04/05/2018 1009   BASOSABS 0.0 04/05/2018 1009   Iron/TIBC/Ferritin/ %Sat    Component Value Date/Time   IRON 111 04/05/2018 1009   TIBC 311 04/05/2018 1009   FERRITIN 71 04/05/2018 1009   IRONPCTSAT 36 04/05/2018 1009   Lipid Panel     Component Value Date/Time   CHOL 183 04/05/2018 1009   TRIG 92 04/05/2018 1009   HDL 54 04/05/2018 1009   LDLCALC 111 (H) 04/05/2018 1009   Hepatic Function Panel     Component Value Date/Time   PROT 6.7 04/05/2018 1009   ALBUMIN 4.0 04/05/2018 1009   AST 46 (H) 04/05/2018 1009   ALT 51 (H) 04/05/2018 1009   ALKPHOS 113 04/05/2018 1009   BILITOT 0.6 04/05/2018 1009      Component Value Date/Time   TSH 0.821 04/05/2018 1009   Vitamin D No recent labs  ECG  shows NSR with a rate of 76 BPM INDIRECT CALORIMETER done today shows a VO2 of 282 and a REE of 1963. Her calculated basal metabolic rate is 6468 thus her basal metabolic rate is better than expected.    ASSESSMENT AND PLAN: Other fatigue - Plan: EKG 12-Lead, Comprehensive metabolic panel, Insulin, random, Lipid Panel With LDL/HDL Ratio, T3, T4, free, TSH  Shortness of breath on exertion  Crohn's disease of both small and large intestine with other complication (HCC)  Vitamin D deficiency - Plan: VITAMIN D 25 Hydroxy (Vit-D Deficiency, Fractures)  B12 nutritional deficiency - Plan: Vitamin B12  Anemia, unspecified type - Plan: CBC With  Differential, Anemia panel  Hyperglycemia - Plan: Hemoglobin A1c  Depression screening  Class 3 severe obesity with serious comorbidity and body mass index (BMI) of 40.0 to 44.9 in adult, unspecified obesity type (HCC)  PLAN:  Fatigue Rosealee was informed that her fatigue may be related to obesity, depression or many other causes. Labs will be ordered, and in the meanwhile Charyl has agreed to work on diet, exercise and weight loss to help with fatigue. Proper sleep hygiene was discussed including the need for 7-8 hours of quality sleep each night. A sleep study was not ordered based on symptoms and Epworth score.  Dyspnea on exertion Xaniyah's shortness of breath appears to be obesity related and exercise induced. She has agreed to work on weight loss and gradually increase exercise to treat her exercise induced shortness of breath. If Ozzie follows our instructions and loses weight without improvement of her shortness of breath, we will plan to refer to pulmonology. We will monitor this condition regularly. Namine agrees to this plan.  Crohn's Disease We will modify diet prescription to be protein and fiber rich to help regulate BM and avoid symptomatic exacerbation. We will follow closely.   Vitamin D Deficiency Priscella was informed that low vitamin D levels contributes to fatigue and are associated with  obesity, breast, and colon cancer. Aubriella will follow up for routine testing of vitamin D, at least 2-3 times per year. She was informed of the risk of over-replacement of vitamin D and agrees to not increase her dose unless she discusses this with [name] first. We will check labs and Carel agrees to follow up with our clinic in 2 weeks.  Vitamin B12 Deficiency Faustina will work on increasing B12 rich foods in her diet. B12 supplementation was not prescribed today. We will check labs and Azalia agrees to follow up with our clinic in 2 weeks.  Anemia The diagnosis of anemia was discussed with Amanat and  was explained in detail. She was given suggestions of iron rich foods and and iron supplement was not prescribed. We will check labs and Milanna agrees to follow up with our clinic in 2 weeks.  Hyperglycemia Fasting labs will be obtained and results with be discussed with Sussan in 2 weeks at her follow up visit. In the meanwhile Kylani was started on a lower simple carbohydrate diet, exercise, and will work on weight loss efforts.  Depression Screen Craig had a mildly positive depression screening. Depression is commonly associated with obesity and often results in emotional eating behaviors. We will monitor this closely and work on CBT to help improve the non-hunger eating patterns. Referral to Psychology may be required if no improvement is seen as she continues in our clinic.  Obesity Maliaka is currently in the action stage of change and her goal is to continue with weight loss efforts She has agreed to follow the Category 3 plan Roslynn has been instructed to work up to a goal of 150 minutes of combined cardio and strengthening exercise per week for weight loss and overall health benefits. We discussed the following Behavioral Modification Strategies today: increasing lean protein intake, decreasing simple carbohydrates  and work on meal planning and easy cooking plans  Laruen has agreed to follow up with our clinic in 2 weeks. She was informed of the importance of frequent follow up visits to maximize her success with intensive lifestyle modifications for her multiple health conditions. She was informed we would discuss her lab results at her next visit unless there is a critical issue that needs to be addressed sooner. Lorijean agreed to keep her next visit at the agreed upon time to discuss these results.    OBESITY BEHAVIORAL INTERVENTION VISIT  Today's visit was # 1 out of 22.  Starting weight: 251 lbs Starting date: 04/05/18 Today's weight : 251 lbs Today's date: 04/05/2018 Total lbs lost to  date: 0 (Patients must lose 7 lbs in the first 6 months to continue with counseling)   ASK: We discussed the diagnosis of obesity with [name] today and Braelyn agreed to give [name] permission to discuss obesity behavioral modification therapy today.  ASSESS: Kaylaann has the diagnosis of obesity and her BMI today is 44.47 Dalesha is in the action stage of change   ADVISE: Mattilynn was educated on the multiple health risks of obesity as well as the benefit of weight loss to improve her health. She was advised of the need for long term treatment and the importance of lifestyle modifications.  AGREE: Multiple dietary modification options and treatment options were discussed and  Cydni agreed to the above obesity treatment plan.   I, Trixie Dredge, am acting as transcriptionist for Dennard Nip, MD  I have reviewed the above documentation for accuracy and completeness, and I agree with the above. -Caren  Leafy Ro, MD

## 2018-04-16 ENCOUNTER — Encounter: Payer: Self-pay | Admitting: Internal Medicine

## 2018-04-24 ENCOUNTER — Ambulatory Visit (INDEPENDENT_AMBULATORY_CARE_PROVIDER_SITE_OTHER): Payer: Medicare Other | Admitting: Family Medicine

## 2018-04-24 VITALS — BP 102/66 | HR 75 | Temp 98.0°F | Ht 63.0 in | Wt 244.0 lb

## 2018-04-24 DIAGNOSIS — I10 Essential (primary) hypertension: Secondary | ICD-10-CM

## 2018-04-24 DIAGNOSIS — E119 Type 2 diabetes mellitus without complications: Secondary | ICD-10-CM

## 2018-04-24 DIAGNOSIS — Z6841 Body Mass Index (BMI) 40.0 and over, adult: Secondary | ICD-10-CM | POA: Diagnosis not present

## 2018-04-24 DIAGNOSIS — E559 Vitamin D deficiency, unspecified: Secondary | ICD-10-CM

## 2018-04-24 DIAGNOSIS — Z9189 Other specified personal risk factors, not elsewhere classified: Secondary | ICD-10-CM | POA: Diagnosis not present

## 2018-04-24 MED ORDER — METFORMIN HCL 500 MG PO TABS: 500.0000 mg | ORAL_TABLET | Freq: Every day | ORAL | 0 refills | Status: DC

## 2018-04-24 MED ORDER — VITAMIN D (ERGOCALCIFEROL) 1.25 MG (50000 UNIT) PO CAPS: 50000.0000 [IU] | ORAL_CAPSULE | ORAL | 0 refills | Status: DC

## 2018-04-25 NOTE — Progress Notes (Signed)
Office: [phone]  /  Fax: [phone]   HPI:   Chief Complaint: OBESITY Patty [name] is here to discuss her progress with her obesity treatment plan. She is on the Category 3 plan and is following her eating plan approximately 95 % of the time. She states she is doing yard work and exercising at the gym for 45 minutes 3 times per week. Patty [name] has done well with weight loss on her category 2 plan, but she struggled to eat all her food. Hunger was controlled overall, but she notes some cravings in the evening. Her weight is 244 lb (110.7 kg) today and has had a weight loss of 7 pounds over a period of 2 to 3 weeks since her last visit. She has lost 7 lbs since starting treatment with [name].  Vitamin D deficiency (new) Patty [name] has a new diagnosis of vitamin D deficiency. She is on OTC vit D and admits fatigue but denies nausea, vomiting or muscle weakness.  Diabetes II (new) Patty [name] has a new diagnosis of diabetes type II. Her A1c is elevated at 6.6 and she had had polyphagia, but this is improved with diet prescription. She has been working on intensive lifestyle modifications including diet, exercise, and weight loss to help control her blood glucose levels.  Hypertension Patty [name] is a 67 y.o. female with hypertension. She noticed increased diuresis in the last 2 weeks with diet prescription and stopped her lasix. She is still urinating more and her blood pressure has decreased. Patty [name] denies chest pain or shortness of breath on exertion. She is working weight loss to help control her blood pressure with the goal of decreasing her risk of heart attack and stroke. Patty [name] blood pressure is not currently controlled.  At risk for cardiovascular disease Patty [name] is at a higher than average risk for cardiovascular disease due to obesity, diabetes and hypertension. She currently denies any chest pain.  ALLERGIES: Allergies  Allergen Reactions  . Lasix [Furosemide] Itching    Hypersensitivity    . Onion Other (See Comments)    sereve diarrhea, stomach pains (Crohn's flares)  . Oxycodone Other (See Comments)    Altered mental status  . Potassium-Containing Compounds Other (See Comments)    Due to chron's, difficult passing though kidneys not allowing absorption in the body   . Prednisone Other (See Comments)    Altered mood  . Sulfa Drugs Cross Reactors Other (See Comments)    "feels like bugs crawling on me"    MEDICATIONS: Current Outpatient Medications on File Prior to Visit  Medication Sig Dispense Refill  . amoxicillin-clavulanate (AUGMENTIN) 875-125 MG tablet Take 1 tablet by mouth 2 (two) times daily. (Patient taking differently: Take 1 tablet by mouth as needed. ) 14 tablet 0  . aspirin EC 81 MG tablet Take 81 mg by mouth daily.    . B Complex-C (B-COMPLEX WITH VITAMIN C) tablet Take 1 tablet by mouth daily.    . Biotin 5000 MCG SUBL Place 5,000 mcg under the tongue daily.    . Cetirizine HCl (ZYRTEC ALLERGY) 10 MG CAPS TAKE 1 TABLET DAILY ALONG WITH THE LASIX 90 capsule 3  . Cholecalciferol (VITAMIN D) 2000 units tablet Take 2,000 Units by mouth daily.    . Chromium 500 MCG TABS Take 500 mcg by mouth every 3 (three) days.    . clonazePAM (KLONOPIN) 0.5 MG tablet Take 0.125-0.25 mg by mouth daily as needed (for anxiety or sleep.).    [name] cyanocobalamin (,VITAMIN B-12,) 1000 MCG/ML injection  Inject 1,000 mcg into the muscle every 30 (thirty) days. Pernicious anemia    . Cyanocobalamin (VITAMIN B-12) 5000 MCG SUBL Place 5,000 mcg under the tongue daily.    . cyclopentolate (CYCLODRYL,CYCLOGYL) 2 % ophthalmic solution Place 1 drop into both eyes 3 (three) times daily as needed (for uveitis or iritis).    . fluticasone (FLONASE) 50 MCG/ACT nasal spray as needed.     . Magnesium 250 MG TABS Take 250 mg by mouth every other day.    . Misc Natural Products (FOCUSED MIND PO) Take 1 tablet by mouth every 3 (three) days. FOCUS FACTOR NUTRITIONAL SUPPLEMENT    . Multiple  Vitamins-Minerals (ICAPS AREDS 2 PO) Take 1 tablet by mouth every other day.     . Niacinamide-Zinc-Copper-FA (NICOTINAMIDE ZCF PO) Take 1 capsule by mouth daily. Nicotinamide Riboside-Pterostilbene 250-50 mg ([employer])    . OVER THE COUNTER MEDICATION Place 1 each under the tongue daily. 3-4 DROP DAILY (CBD OIL)    . Probiotic Product (PROBIOTIC PO) Take 1 capsule by mouth once a week.    . traMADol-acetaminophen (ULTRACET) 37.5-325 MG tablet Take 0.5-1 tablets by mouth every 6 (six) hours as needed (for pain.).    [name] TURMERIC PO Take 1 capsule by mouth every other day. CURAMIN     No current facility-administered medications on file prior to visit.     PAST MEDICAL HISTORY: Past Medical History:  Diagnosis Date  . Abnormal nuclear stress test    a. 07/2017: cath with no CAD  . Amyloidosis ([place])   . Anemia   . Crohn disease ([place])   . Crohn's colitis ([place])   . ILD (interstitial lung disease) ([place])   . OSA on CPAP   . Pernicious anemia   . Polycythemia vera ([place])   . Sinus problem     PAST SURGICAL HISTORY: Past Surgical History:  Procedure Laterality Date  . COLON RESECTION    . HIP ARTHROPLASTY    . LEFT HEART CATH AND CORONARY ANGIOGRAPHY N/A 07/28/2017   Procedure: Left Heart Cath and Coronary Angiography;  Surgeon: Burnell Blanks, MD;  Location: [facility];  Service: Cardiovascular;  Laterality: N/A;    SOCIAL HISTORY: Social History   Tobacco Use  . Smoking status: Former Smoker    Packs/day: 1.00    Years: 45.00    Pack years: 45.00    Types: Cigarettes    Last attempt to quit: 12/26/2008    Years since quitting: 9.3  . Smokeless tobacco: Never Used  Substance Use Topics  . Alcohol use: Yes    Comment: occ  . Drug use: No    FAMILY HISTORY: Family History  Problem Relation Age of Onset  . Glaucoma Mother   . High blood pressure Mother   . High Cholesterol Mother   . Sleep apnea Mother   . Diabetes Mellitus II  Father   . Sleep apnea Father   . Anxiety disorder Father   . COPD Brother   . Heart disease Brother   . Non-Hodgkin's lymphoma Brother   . Diabetes Mellitus II Brother   . Glaucoma Brother     ROS: Review of Systems  Constitutional: Positive for malaise/fatigue and weight loss.  Respiratory: Negative for shortness of breath (on exertion).   Cardiovascular: Negative for chest pain.  Gastrointestinal: Negative for nausea and vomiting.  Genitourinary:       Positive for diuresis  Musculoskeletal:       Negative for  muscle weakness    PHYSICAL EXAM: Blood pressure 102/66, pulse 75, temperature 98 F (36.7 C), temperature source Oral, height _0  (1.6 m), weight 244 lb (110.7 kg), SpO2 93 %. Body mass index is 43.22 kg/m. Physical Exam  Constitutional: She is oriented to person, place, and time. She appears well-developed and well-nourished.  Cardiovascular: Normal rate.  Pulmonary/Chest: Effort normal.  Musculoskeletal: Normal range of motion.  Neurological: She is oriented to person, place, and time.  Skin: Skin is warm and dry.  Psychiatric: She has a normal mood and affect. Her behavior is normal.  Vitals reviewed.   RECENT LABS AND TESTS: BMET    Component Value Date/Time   NA 141 04/05/2018 1009   K 3.6 04/05/2018 1009   CL 100 04/05/2018 1009   CO2 25 04/05/2018 1009   GLUCOSE 130 (H) 04/05/2018 1009   GLUCOSE 119 (H) 07/29/2017 0553   BUN 11 04/05/2018 1009   CREATININE 0.87 04/05/2018 1009   CALCIUM 9.4 04/05/2018 1009   GFRNONAA 69 04/05/2018 1009   GFRAA 80 04/05/2018 1009   Lab Results  Component Value Date   HGBA1C 6.6 (H) 04/05/2018   Lab Results  Component Value Date   INSULIN 30.6 (H) 04/05/2018   CBC    Component Value Date/Time   WBC 7.1 04/05/2018 1009   RBC 4.94 04/05/2018 1009   HGB 14.7 04/05/2018 1009   HCT 43.6 04/05/2018 1009   PLT 342 07/25/2017 1544   MCV 88 04/05/2018 1009   MCH 29.8 04/05/2018 1009   MCHC 33.7  04/05/2018 1009   RDW 15.3 04/05/2018 1009   LYMPHSABS 1.6 04/05/2018 1009   EOSABS 0.2 04/05/2018 1009   BASOSABS 0.0 04/05/2018 1009   Iron/TIBC/Ferritin/ %Sat    Component Value Date/Time   IRON 111 04/05/2018 1009   TIBC 311 04/05/2018 1009   FERRITIN 71 04/05/2018 1009   IRONPCTSAT 36 04/05/2018 1009   Lipid Panel     Component Value Date/Time   CHOL 183 04/05/2018 1009   TRIG 92 04/05/2018 1009   HDL 54 04/05/2018 1009   LDLCALC 111 (H) 04/05/2018 1009   Hepatic Function Panel     Component Value Date/Time   PROT 6.7 04/05/2018 1009   ALBUMIN 4.0 04/05/2018 1009   AST 46 (H) 04/05/2018 1009   ALT 51 (H) 04/05/2018 1009   ALKPHOS 113 04/05/2018 1009   BILITOT 0.6 04/05/2018 1009      Component Value Date/Time   TSH 0.821 04/05/2018 1009   Results for BRITANNY, MARKSBERRY (MRN [id number]) as of 04/25/2018 16:14  Ref. Range 04/05/2018 10:09  Vitamin D, 25-Hydroxy Latest Ref Range: 30.0 - 100.0 ng/mL 27.5 (L)   ASSESSMENT AND PLAN: Vitamin D deficiency - Plan: Vitamin D, Ergocalciferol, (DRISDOL) 50000 units CAPS capsule  Essential hypertension  Type 2 diabetes mellitus without complication, without long-term current use of insulin (HCC) - Plan: metFORMIN (GLUCOPHAGE) 500 MG tablet  At risk for heart disease  Class 3 severe obesity with serious comorbidity and body mass index (BMI) of 40.0 to 44.9 in adult, unspecified obesity type ([place])  PLAN:  Vitamin D Deficiency (new) Patty [name] was informed that low vitamin D levels contributes to fatigue and are associated with obesity, breast, and colon cancer. She agrees to start to take prescription Vit D _1 ,000 IU every week #4 with no refills and will follow up for routine testing of vitamin D, at least 2-3 times per year. She was informed of the risk of over-replacement of vitamin D  and agrees to not increase her dose unless she discusses this with [name] first. Kerline agrees to follow up with our clinic in 2 weeks.  Diabetes II  (new) Patty [name] has been given extensive diabetes education by myself today including ideal fasting and post-prandial blood glucose readings, individual ideal Hgb A1c goals and hypoglycemia prevention. We discussed the importance of good blood sugar control to decrease the likelihood of diabetic complications such as nephropathy, neuropathy, limb loss, blindness, coronary artery disease, and death. We discussed the importance of intensive lifestyle modification including diet, exercise and weight loss as the first line treatment for diabetes. Mykenzi agrees to start metformin 500 mg qAM #30 with no refills and will wait to start checking blood sugar for now. Arma agrees to continue with diet and exercise and follow up at the agreed upon time.  Hypertension We discussed sodium restriction, working on healthy weight loss, and a regular exercise program as the means to achieve improved blood pressure control. Patty [name] agreed with this plan and agreed to follow up as directed. We will continue to monitor her blood pressure as well as her progress with the above lifestyle modifications. She will discontinue lasix and potassium and we will continue to follow closely. May need to take as needed. Patty [name] will watch for signs of hypotension as she continues her lifestyle modifications.  Cardiovascular risk counseling Patty [name] was given extended (30 minutes) coronary artery disease prevention counseling today. She is 67 y.o. female and has risk factors for heart disease including obesity, diabetes and hypertension. We discussed intensive lifestyle modifications today with an emphasis on specific weight loss instructions and strategies. Pt was also informed of the importance of increasing exercise and decreasing saturated fats to help prevent heart disease.  Obesity Patty [name] is currently in the action stage of change. As such, her goal is to continue with weight loss efforts She has agreed to follow the Category 2 plan Patty [name] has  been instructed to work up to a goal of 150 minutes of combined cardio and strengthening exercise per week for weight loss and overall health benefits. We discussed the following Behavioral Modification Strategies today: no skipping meals, increasing lean protein intake, decreasing sodium intake and work on meal planning and easy cooking plans  Patty [name] has agreed to follow up with our clinic in 2 weeks. She was informed of the importance of frequent follow up visits to maximize her success with intensive lifestyle modifications for her multiple health conditions.   OBESITY BEHAVIORAL INTERVENTION VISIT  Today's visit was # 2 out of 22.  Starting weight: 251 lbs Starting date: 04/05/18 Today's weight : 244 lbs Today's date: 04/24/2018 Total lbs lost to date: 7 (Patients must lose 7 lbs in the first 6 months to continue with counseling)   ASK: We discussed the diagnosis of obesity with Patty [name] today and Patty [name] agreed to give [name] permission to discuss obesity behavioral modification therapy today.  ASSESS: Patty [name] has the diagnosis of obesity and her BMI today is 43.23 Patty [name] is in the action stage of change   ADVISE: Patty [name] was educated on the multiple health risks of obesity as well as the benefit of weight loss to improve her health. She was advised of the need for long term treatment and the importance of lifestyle modifications.  AGREE: Multiple dietary modification options and treatment options were discussed and  Patty [name] agreed to the above obesity treatment plan.  Corey Skains, am acting as transcriptionist for Dennard Nip, MD  I have reviewed  the above documentation for accuracy and completeness, and I agree with the above. -Dennard Nip, MD

## 2018-04-28 ENCOUNTER — Encounter (INDEPENDENT_AMBULATORY_CARE_PROVIDER_SITE_OTHER): Payer: Self-pay | Admitting: Family Medicine

## 2018-05-08 ENCOUNTER — Ambulatory Visit (INDEPENDENT_AMBULATORY_CARE_PROVIDER_SITE_OTHER): Payer: Medicare Other | Admitting: Family Medicine

## 2018-05-08 VITALS — BP 94/65 | HR 86 | Temp 97.7°F | Ht 63.0 in | Wt 241.0 lb

## 2018-05-08 DIAGNOSIS — E119 Type 2 diabetes mellitus without complications: Secondary | ICD-10-CM

## 2018-05-08 DIAGNOSIS — E559 Vitamin D deficiency, unspecified: Secondary | ICD-10-CM | POA: Diagnosis not present

## 2018-05-08 DIAGNOSIS — Z6841 Body Mass Index (BMI) 40.0 and over, adult: Secondary | ICD-10-CM | POA: Diagnosis not present

## 2018-05-08 MED ORDER — CHOLECALCIFEROL 100 MCG (4000 UT) PO CAPS: 1.0000 | ORAL_CAPSULE | Freq: Once | ORAL | Status: AC

## 2018-05-09 NOTE — Progress Notes (Signed)
Office: [phone]  /  Fax: [phone]   HPI:   Chief Complaint: OBESITY Patty [name] is here to discuss her progress with her obesity treatment plan. She is on the Category 2 plan and is following her eating plan approximately 98 % of the time. She states she is doing the elliptical, treadmill, and strengthening for 45-60 minutes 3 times per week. Patty [name] continues to do well on Category 2 plan. She sometimes doesn't eat all her food though.  Her weight is 241 lb (109.3 kg) today and has had a weight loss of 3 pounds over a period of 2 weeks since her last visit. She has lost 10 lbs since starting treatment with [name].  Diabetes II Patty [name] has a diagnosis of diabetes mellitus with an A1c of 6.6. She started metformin but stopped after 3 days because she feels it made her feel very tired and weak and had insomnia. She looked up possible side effects online and worried she had lactic acidosis. She denies any hypoglycemic episodes. She has been working on intensive lifestyle modifications including diet, exercise, and weight loss to help control her blood glucose levels.  Vitamin D Deficiency Patty [name] has a diagnosis of vitamin D deficiency. She started Vit D weekly prescription but thinks it is making her feel weak and doesn't want to take this anymore. She felt it made her back hurt bad enough to have to take tramadol and flexeril. She denies nausea, vomiting or muscle weakness.  ALLERGIES: Allergies  Allergen Reactions  . Lasix [Furosemide] Itching    Hypersensitivity   . Onion Other (See Comments)    sereve diarrhea, stomach pains (Crohn's flares)  . Oxycodone Other (See Comments)    Altered mental status  . Potassium-Containing Compounds Other (See Comments)    Due to chron's, difficult passing though kidneys not allowing absorption in the body   . Prednisone Other (See Comments)    Altered mood  . Sulfa Drugs Cross Reactors Other (See Comments)    "feels like bugs crawling on me"     MEDICATIONS: Current Outpatient Medications on File Prior to Visit  Medication Sig Dispense Refill  . amoxicillin-clavulanate (AUGMENTIN) 875-125 MG tablet Take 1 tablet by mouth 2 (two) times daily. (Patient taking differently: Take 1 tablet by mouth as needed. ) 14 tablet 0  . aspirin EC 81 MG tablet Take 81 mg by mouth daily.    . B Complex-C (B-COMPLEX WITH VITAMIN C) tablet Take 1 tablet by mouth daily.    . Biotin 5000 MCG SUBL Place 5,000 mcg under the tongue daily.    . Cetirizine HCl (ZYRTEC ALLERGY) 10 MG CAPS TAKE 1 TABLET DAILY ALONG WITH THE LASIX 90 capsule 3  . Chromium 500 MCG TABS Take 500 mcg by mouth every 3 (three) days.    . clonazePAM (KLONOPIN) 0.5 MG tablet Take 0.125-0.25 mg by mouth daily as needed (for anxiety or sleep.).    [name] cyanocobalamin (,VITAMIN B-12,) 1000 MCG/ML injection Inject 1,000 mcg into the muscle every 30 (thirty) days. Pernicious anemia    . Cyanocobalamin (VITAMIN B-12) 5000 MCG SUBL Place 5,000 mcg under the tongue daily.    . cyclopentolate (CYCLODRYL,CYCLOGYL) 2 % ophthalmic solution Place 1 drop into both eyes 3 (three) times daily as needed (for uveitis or iritis).    . fluticasone (FLONASE) 50 MCG/ACT nasal spray as needed.     . Magnesium 250 MG TABS Take 250 mg by mouth every other day.    . Misc Natural Products (FOCUSED  MIND PO) Take 1 tablet by mouth every 3 (three) days. FOCUS FACTOR NUTRITIONAL SUPPLEMENT    . Multiple Vitamins-Minerals (ICAPS AREDS 2 PO) Take 1 tablet by mouth every other day.     . Niacinamide-Zinc-Copper-FA (NICOTINAMIDE ZCF PO) Take 1 capsule by mouth daily. Nicotinamide Riboside-Pterostilbene 250-50 mg ([employer])    . OVER THE COUNTER MEDICATION Place 1 each under the tongue daily. 3-4 DROP DAILY (CBD OIL)    . Probiotic Product (PROBIOTIC PO) Take 1 capsule by mouth once a week.    . traMADol-acetaminophen (ULTRACET) 37.5-325 MG tablet Take 0.5-1 tablets by mouth every 6 (six) hours  as needed (for pain.).    [name] TURMERIC PO Take 1 capsule by mouth every other day. CURAMIN     No current facility-administered medications on file prior to visit.     PAST MEDICAL HISTORY: Past Medical History:  Diagnosis Date  . Abnormal nuclear stress test    a. 07/2017: cath with no CAD  . Amyloidosis ([place])   . Anemia   . Crohn disease ([place])   . Crohn's colitis ([place])   . ILD (interstitial lung disease) ([place])   . OSA on CPAP   . Pernicious anemia   . Polycythemia vera ([place])   . Sinus problem     PAST SURGICAL HISTORY: Past Surgical History:  Procedure Laterality Date  . COLON RESECTION    . HIP ARTHROPLASTY    . LEFT HEART CATH AND CORONARY ANGIOGRAPHY N/A 07/28/2017   Procedure: Left Heart Cath and Coronary Angiography;  Surgeon: Burnell Blanks, MD;  Location: [facility];  Service: Cardiovascular;  Laterality: N/A;    SOCIAL HISTORY: Social History   Tobacco Use  . Smoking status: Former Smoker    Packs/day: 1.00    Years: 45.00    Pack years: 45.00    Types: Cigarettes    Last attempt to quit: 12/26/2008    Years since quitting: 9.3  . Smokeless tobacco: Never Used  Substance Use Topics  . Alcohol use: Yes    Comment: occ  . Drug use: No    FAMILY HISTORY: Family History  Problem Relation Age of Onset  . Glaucoma Mother   . High blood pressure Mother   . High Cholesterol Mother   . Sleep apnea Mother   . Diabetes Mellitus II Father   . Sleep apnea Father   . Anxiety disorder Father   . COPD Brother   . Heart disease Brother   . Non-Hodgkin's lymphoma Brother   . Diabetes Mellitus II Brother   . Glaucoma Brother     ROS: Review of Systems  Constitutional: Positive for weight loss.  Gastrointestinal: Negative for nausea and vomiting.  Musculoskeletal:       Negative muscle weakness  Endo/Heme/Allergies:       Negative hypoglycemia  Psychiatric/Behavioral: The patient has insomnia.     PHYSICAL EXAM: Blood pressure 94/65, pulse  86, temperature 97.7 F (36.5 C), temperature source Oral, height _0  (1.6 m), weight 241 lb (109.3 kg), SpO2 92 %. Body mass index is 42.69 kg/m. Physical Exam  Constitutional: She is oriented to person, place, and time. She appears well-developed and well-nourished.  Cardiovascular: Normal rate.  Pulmonary/Chest: Effort normal.  Musculoskeletal: Normal range of motion.  Neurological: She is oriented to person, place, and time.  Skin: Skin is warm and dry.  Psychiatric: She has a normal mood and affect. Her behavior is normal.  Vitals reviewed.  RECENT LABS AND TESTS: BMET    Component Value Date/Time   NA 141 04/05/2018 1009   K 3.6 04/05/2018 1009   CL 100 04/05/2018 1009   CO2 25 04/05/2018 1009   GLUCOSE 130 (H) 04/05/2018 1009   GLUCOSE 119 (H) 07/29/2017 0553   BUN 11 04/05/2018 1009   CREATININE 0.87 04/05/2018 1009   CALCIUM 9.4 04/05/2018 1009   GFRNONAA 69 04/05/2018 1009   GFRAA 80 04/05/2018 1009   Lab Results  Component Value Date   HGBA1C 6.6 (H) 04/05/2018   Lab Results  Component Value Date   INSULIN 30.6 (H) 04/05/2018   CBC    Component Value Date/Time   WBC 7.1 04/05/2018 1009   RBC 4.94 04/05/2018 1009   HGB 14.7 04/05/2018 1009   HCT 43.6 04/05/2018 1009   PLT 342 07/25/2017 1544   MCV 88 04/05/2018 1009   MCH 29.8 04/05/2018 1009   MCHC 33.7 04/05/2018 1009   RDW 15.3 04/05/2018 1009   LYMPHSABS 1.6 04/05/2018 1009   EOSABS 0.2 04/05/2018 1009   BASOSABS 0.0 04/05/2018 1009   Iron/TIBC/Ferritin/ %Sat    Component Value Date/Time   IRON 111 04/05/2018 1009   TIBC 311 04/05/2018 1009   FERRITIN 71 04/05/2018 1009   IRONPCTSAT 36 04/05/2018 1009   Lipid Panel     Component Value Date/Time   CHOL 183 04/05/2018 1009   TRIG 92 04/05/2018 1009   HDL 54 04/05/2018 1009   LDLCALC 111 (H) 04/05/2018 1009   Hepatic Function Panel     Component Value Date/Time   PROT 6.7 04/05/2018 1009   ALBUMIN 4.0 04/05/2018 1009   AST 46  (H) 04/05/2018 1009   ALT 51 (H) 04/05/2018 1009   ALKPHOS 113 04/05/2018 1009   BILITOT 0.6 04/05/2018 1009      Component Value Date/Time   TSH 0.821 04/05/2018 1009  Results for MIKAIA, JANVIER (MRN [id number]) as of 05/09/2018 14:46  Ref. Range 04/05/2018 10:09  Vitamin D, 25-Hydroxy Latest Ref Range: 30.0 - 100.0 ng/mL 27.5 (L)    ASSESSMENT AND PLAN: Type 2 diabetes mellitus without complication, without long-term current use of insulin (HCC)  Vitamin D deficiency - Plan: Cholecalciferol (HM VITAMIN D3) 4000 units CAPS  Class 3 severe obesity with serious comorbidity and body mass index (BMI) of 40.0 to 44.9 in adult, unspecified obesity type ([place])  PLAN:  Diabetes II Leanore has been given extensive diabetes education by myself today including ideal fasting and post-prandial blood glucose readings, individual ideal Hgb A1c goals and hypoglycemia prevention. We discussed the importance of good blood sugar control to decrease the likelihood of diabetic complications such as nephropathy, neuropathy, limb loss, blindness, coronary artery disease, and death. We discussed the importance of intensive lifestyle modification including diet, exercise and weight loss as the first line treatment for diabetes. Julyanna agrees to discontinue metformin and she agrees to follow up with our clinic in 3 weeks.  Vitamin D Deficiency Persais was informed that low vitamin D levels contributes to fatigue and are associated with obesity, breast, and colon cancer. Oneda agrees to discontinue prescription Vit D, and start OTC Vit D 4,000 daily and will follow up for routine testing of vitamin D, at least 2-3 times per year. She was informed of the risk of over-replacement of vitamin D and agrees to not increase her dose unless she discusses this with [name] first. Takima agrees to follow up with our clinic in 3 weeks.  We spent > than 50%  of the 30 minute visit on the counseling as documented in the  note.  Obesity Jarita is currently in the action stage of change. As such, her goal is to continue with weight loss efforts She has agreed to follow the Category 2 plan Odaly has been instructed to work up to a goal of 150 minutes of combined cardio and strengthening exercise per week for weight loss and overall health benefits. We discussed the following Behavioral Modification Strategies today: increasing lean protein intake and decreasing simple carbohydrates    Alleah has agreed to follow up with our clinic in 3 weeks. She was informed of the importance of frequent follow up visits to maximize her success with intensive lifestyle modifications for her multiple health conditions.   OBESITY BEHAVIORAL INTERVENTION VISIT  Today's visit was # 3 out of 22.  Starting weight: 251 lbs Starting date: 04/05/18 Today's weight : 241 lbs  Today's date: 05/08/2018 Total lbs lost to date: 10 (Patients must lose 7 lbs in the first 6 months to continue with counseling)   ASK: We discussed the diagnosis of obesity with [name] today and Audie agreed to give [name] permission to discuss obesity behavioral modification therapy today.  ASSESS: Marshe has the diagnosis of obesity and her BMI today is 42.7 Aki is in the action stage of change   ADVISE: Delailah was educated on the multiple health risks of obesity as well as the benefit of weight loss to improve her health. She was advised of the need for long term treatment and the importance of lifestyle modifications.  AGREE: Multiple dietary modification options and treatment options were discussed and  Bernarda agreed to the above obesity treatment plan.  I, Trixie Dredge, am acting as transcriptionist for Dennard Nip, MD  I have reviewed the above documentation for accuracy and completeness, and I agree with the above. -Dennard Nip, MD

## 2018-05-15 ENCOUNTER — Other Ambulatory Visit: Payer: Medicare Other

## 2018-05-29 ENCOUNTER — Ambulatory Visit (INDEPENDENT_AMBULATORY_CARE_PROVIDER_SITE_OTHER): Payer: Medicare Other | Admitting: Family Medicine

## 2018-05-29 VITALS — BP 93/62 | HR 79 | Temp 97.9°F | Ht 63.0 in | Wt 235.0 lb

## 2018-05-29 DIAGNOSIS — E119 Type 2 diabetes mellitus without complications: Secondary | ICD-10-CM | POA: Diagnosis not present

## 2018-05-29 DIAGNOSIS — Z6841 Body Mass Index (BMI) 40.0 and over, adult: Secondary | ICD-10-CM | POA: Diagnosis not present

## 2018-05-29 NOTE — Progress Notes (Signed)
Office: [phone]  /  Fax: [phone]   HPI:   Chief Complaint: OBESITY Patty [name] is here to discuss her progress with her obesity treatment plan. She is on the Category 2 plan and is following her eating plan approximately 90 to 92 % of the time. She states she is exercising on the treadmill and elliptical and is doing weights for 60 minutes 3 times per week. Patty [name] continues to do well with weight loss on the category 2 plan. Hunger is controlled. Patty [name] is feeling well and she has no significant struggles. Her weight is 235 lb (106.6 kg) today and has had a weight loss of 6 pounds over a period of 3 weeks since her last visit. She has lost 16 lbs since starting treatment with [name].  Diabetes II Patty [name] has a diagnosis of diabetes type II. Patty [name] is not checking blood sugar at home. Last A1c was at 6.6 She is not on medication and is trying to control her diabetes with diet. She tried metformin, but had perceived hypoglycemia in the past. She has been working on intensive lifestyle modifications including diet, exercise, and weight loss to help control her blood glucose levels.  ALLERGIES: Allergies  Allergen Reactions  . Lasix [Furosemide] Itching    Hypersensitivity   . Onion Other (See Comments)    sereve diarrhea, stomach pains (Crohn's flares)  . Oxycodone Other (See Comments)    Altered mental status  . Potassium-Containing Compounds Other (See Comments)    Due to chron's, difficult passing though kidneys not allowing absorption in the body   . Prednisone Other (See Comments)    Altered mood  . Sulfa Drugs Cross Reactors Other (See Comments)    "feels like bugs crawling on me"    MEDICATIONS: Current Outpatient Medications on File Prior to Visit  Medication Sig Dispense Refill  . amoxicillin-clavulanate (AUGMENTIN) 875-125 MG tablet Take 1 tablet by mouth 2 (two) times daily. (Patient taking differently: Take 1 tablet by mouth as needed. ) 14 tablet 0  . aspirin EC 81 MG  tablet Take 81 mg by mouth daily.    . B Complex-C (B-COMPLEX WITH VITAMIN C) tablet Take 1 tablet by mouth daily.    . Biotin 5000 MCG SUBL Place 5,000 mcg under the tongue daily.    . Cetirizine HCl (ZYRTEC ALLERGY) 10 MG CAPS TAKE 1 TABLET DAILY ALONG WITH THE LASIX 90 capsule 3  . Chromium 500 MCG TABS Take 500 mcg by mouth every 3 (three) days.    . clonazePAM (KLONOPIN) 0.5 MG tablet Take 0.125-0.25 mg by mouth daily as needed (for anxiety or sleep.).    [name] cyanocobalamin (,VITAMIN B-12,) 1000 MCG/ML injection Inject 1,000 mcg into the muscle every 30 (thirty) days. Pernicious anemia    . Cyanocobalamin (VITAMIN B-12) 5000 MCG SUBL Place 5,000 mcg under the tongue daily.    . cyclopentolate (CYCLODRYL,CYCLOGYL) 2 % ophthalmic solution Place 1 drop into both eyes 3 (three) times daily as needed (for uveitis or iritis).    . fluticasone (FLONASE) 50 MCG/ACT nasal spray as needed.     . Magnesium 250 MG TABS Take 250 mg by mouth every other day.    . Misc Natural Products (FOCUSED MIND PO) Take 1 tablet by mouth every 3 (three) days. FOCUS FACTOR NUTRITIONAL SUPPLEMENT    . Multiple Vitamins-Minerals (ICAPS AREDS 2 PO) Take 1 tablet by mouth every other day.     . Niacinamide-Zinc-Copper-FA (NICOTINAMIDE ZCF PO) Take 1 capsule by mouth daily. Nicotinamide  Riboside-Pterostilbene 250-50 mg ([employer])    . OVER THE COUNTER MEDICATION Place 1 each under the tongue daily. 3-4 DROP DAILY (CBD OIL)    . Probiotic Product (PROBIOTIC PO) Take 1 capsule by mouth once a week.    . traMADol-acetaminophen (ULTRACET) 37.5-325 MG tablet Take 0.5-1 tablets by mouth every 6 (six) hours as needed (for pain.).    [name] TURMERIC PO Take 1 capsule by mouth every other day. CURAMIN     No current facility-administered medications on file prior to visit.     PAST MEDICAL HISTORY: Past Medical History:  Diagnosis Date  . Abnormal nuclear stress test    a. 07/2017: cath with no CAD  .  Amyloidosis ([place])   . Anemia   . Crohn disease ([place])   . Crohn's colitis ([place])   . ILD (interstitial lung disease) ([place])   . OSA on CPAP   . Pernicious anemia   . Polycythemia vera ([place])   . Sinus problem     PAST SURGICAL HISTORY: Past Surgical History:  Procedure Laterality Date  . COLON RESECTION    . HIP ARTHROPLASTY    . LEFT HEART CATH AND CORONARY ANGIOGRAPHY N/A 07/28/2017   Procedure: Left Heart Cath and Coronary Angiography;  Surgeon: Burnell Blanks, MD;  Location: [facility];  Service: Cardiovascular;  Laterality: N/A;    SOCIAL HISTORY: Social History   Tobacco Use  . Smoking status: Former Smoker    Packs/day: 1.00    Years: 45.00    Pack years: 45.00    Types: Cigarettes    Last attempt to quit: 12/26/2008    Years since quitting: 9.4  . Smokeless tobacco: Never Used  Substance Use Topics  . Alcohol use: Yes    Comment: occ  . Drug use: No    FAMILY HISTORY: Family History  Problem Relation Age of Onset  . Glaucoma Mother   . High blood pressure Mother   . High Cholesterol Mother   . Sleep apnea Mother   . Diabetes Mellitus II Father   . Sleep apnea Father   . Anxiety disorder Father   . COPD Brother   . Heart disease Brother   . Non-Hodgkin's lymphoma Brother   . Diabetes Mellitus II Brother   . Glaucoma Brother     ROS: Review of Systems  Constitutional: Positive for weight loss.  Endo/Heme/Allergies:       Negative for polyphagia    PHYSICAL EXAM: Blood pressure 93/62, pulse 79, temperature 97.9 F (36.6 C), temperature source Oral, height _0  (1.6 m), weight 235 lb (106.6 kg), SpO2 92 %. Body mass index is 41.63 kg/m. Physical Exam  Constitutional: She is oriented to person, place, and time. She appears well-developed and well-nourished.  Cardiovascular: Normal rate.  Pulmonary/Chest: Effort normal.  Musculoskeletal: Normal range of motion.  Neurological: She is oriented to person, place, and time.  Skin: Skin  is warm and dry.  Psychiatric: She has a normal mood and affect. Her behavior is normal.  Vitals reviewed.   RECENT LABS AND TESTS: BMET    Component Value Date/Time   NA 141 04/05/2018 1009   K 3.6 04/05/2018 1009   CL 100 04/05/2018 1009   CO2 25 04/05/2018 1009   GLUCOSE 130 (H) 04/05/2018 1009   GLUCOSE 119 (H) 07/29/2017 0553   BUN 11 04/05/2018 1009   CREATININE 0.87 04/05/2018 1009   CALCIUM 9.4 04/05/2018 1009   GFRNONAA 69 04/05/2018 1009  GFRAA 80 04/05/2018 1009   Lab Results  Component Value Date   HGBA1C 6.6 (H) 04/05/2018   Lab Results  Component Value Date   INSULIN 30.6 (H) 04/05/2018   CBC    Component Value Date/Time   WBC 7.1 04/05/2018 1009   RBC 4.94 04/05/2018 1009   HGB 14.7 04/05/2018 1009   HCT 43.6 04/05/2018 1009   PLT 342 07/25/2017 1544   MCV 88 04/05/2018 1009   MCH 29.8 04/05/2018 1009   MCHC 33.7 04/05/2018 1009   RDW 15.3 04/05/2018 1009   LYMPHSABS 1.6 04/05/2018 1009   EOSABS 0.2 04/05/2018 1009   BASOSABS 0.0 04/05/2018 1009   Iron/TIBC/Ferritin/ %Sat    Component Value Date/Time   IRON 111 04/05/2018 1009   TIBC 311 04/05/2018 1009   FERRITIN 71 04/05/2018 1009   IRONPCTSAT 36 04/05/2018 1009   Lipid Panel     Component Value Date/Time   CHOL 183 04/05/2018 1009   TRIG 92 04/05/2018 1009   HDL 54 04/05/2018 1009   LDLCALC 111 (H) 04/05/2018 1009   Hepatic Function Panel     Component Value Date/Time   PROT 6.7 04/05/2018 1009   ALBUMIN 4.0 04/05/2018 1009   AST 46 (H) 04/05/2018 1009   ALT 51 (H) 04/05/2018 1009   ALKPHOS 113 04/05/2018 1009   BILITOT 0.6 04/05/2018 1009      Component Value Date/Time   TSH 0.821 04/05/2018 1009   Results for MADDUX, FIRST (MRN [id number]) as of 05/29/2018 15:00  Ref. Range 04/05/2018 10:09  Vitamin D, 25-Hydroxy Latest Ref Range: 30.0 - 100.0 ng/mL 27.5 (L)   ASSESSMENT AND PLAN: Type 2 diabetes mellitus without complication, without long-term current use of insulin  (HCC)  Class 3 severe obesity with serious comorbidity and body mass index (BMI) of 40.0 to 44.9 in adult, unspecified obesity type ([place])  PLAN:  Diabetes II Tyrone has been given extensive diabetes education by myself today including ideal fasting and post-prandial blood glucose readings, individual ideal Hgb A1c goals and hypoglycemia prevention. We discussed the importance of good blood sugar control to decrease the likelihood of diabetic complications such as nephropathy, neuropathy, limb loss, blindness, coronary artery disease, and death. We discussed the importance of intensive lifestyle modification including diet, exercise and weight loss as the first line treatment for diabetes. Henryetta agrees to retry metformin half pill qHS (no refill needed) and follow up at the agreed upon time. Shye agrees to continue with diet and exercise.  Obesity Kyran is currently in the action stage of change. As such, her goal is to continue with weight loss efforts She has agreed to follow the Category 2 plan Markeya has been instructed to work up to a goal of 150 minutes of combined cardio and strengthening exercise per week for weight loss and overall health benefits. We discussed the following Behavioral Modification Strategies today: no skipping meals, increasing lean protein intake and decreasing simple carbohydrates   [place] has agreed to follow up with our clinic in 2 to 3 weeks. She was informed of the importance of frequent follow up visits to maximize her success with intensive lifestyle modifications for her multiple health conditions.   OBESITY BEHAVIORAL INTERVENTION VISIT  Today's visit was # 4 out of 22.  Starting weight: 251 lbs Starting date: 04/05/18 Today's weight : 235 lbs  Today's date: 05/29/2018 Total lbs lost to date: 16 (Patients must lose 7 lbs in the first 6 months to continue with counseling)   ASK: We  discussed the diagnosis of obesity with [name] today and Mayleen  agreed to give [name] permission to discuss obesity behavioral modification therapy today.  ASSESS: Alishah has the diagnosis of obesity and her BMI today is 41.64 Salwa is in the action stage of change   ADVISE: Saesha was educated on the multiple health risks of obesity as well as the benefit of weight loss to improve her health. She was advised of the need for long term treatment and the importance of lifestyle modifications.  AGREE: Multiple dietary modification options and treatment options were discussed and  Twilia agreed to the above obesity treatment plan.  I, Doreene Nest, am acting as transcriptionist for Dennard Nip, MD  I have reviewed the above documentation for accuracy and completeness, and I agree with the above. -Dennard Nip, MD

## 2018-06-14 ENCOUNTER — Ambulatory Visit (INDEPENDENT_AMBULATORY_CARE_PROVIDER_SITE_OTHER): Payer: Medicare Other | Admitting: Family Medicine

## 2018-06-14 VITALS — BP 100/66 | HR 86 | Temp 98.0°F | Ht 63.0 in | Wt 231.0 lb

## 2018-06-14 DIAGNOSIS — E119 Type 2 diabetes mellitus without complications: Secondary | ICD-10-CM

## 2018-06-14 DIAGNOSIS — Z6841 Body Mass Index (BMI) 40.0 and over, adult: Secondary | ICD-10-CM

## 2018-06-14 NOTE — Progress Notes (Signed)
Office: [phone]  /  Fax: [phone]   HPI:   Chief Complaint: OBESITY Patty [name] is here to discuss her progress with her obesity treatment plan. She is on the Category 2 plan and is following her eating plan approximately 90 % of the time. She states she is on the treadmill, elliptical, and weight machines for 45-60 minutes 1-3 times per week. Patty [name] continues to do well with weight loss but she is getting ready to go on vacation to [place] for 1 month.  Her weight is 231 lb (104.8 kg) today and has had a weight loss of 4 pounds over a period of 2 weeks since her last visit. She has lost 20 lbs since starting treatment with [name].  Diabetes II Patty [name] has a diagnosis of diabetes type II. Patty [name] states she is not checking BGs, last A1c was 6.6 and she denies hypoglycemia. She notes polyphagia. She is attempting to control with diet and supplements. She has been working on intensive lifestyle modifications including diet, exercise, and weight loss to help control her blood glucose levels.  ALLERGIES: Allergies  Allergen Reactions  . Lasix [Furosemide] Itching    Hypersensitivity   . Onion Other (See Comments)    sereve diarrhea, stomach pains (Crohn's flares)  . Oxycodone Other (See Comments)    Altered mental status  . Potassium-Containing Compounds Other (See Comments)    Due to chron's, difficult passing though kidneys not allowing absorption in the body   . Prednisone Other (See Comments)    Altered mood  . Sulfa Drugs Cross Reactors Other (See Comments)    "feels like bugs crawling on me"    MEDICATIONS: Current Outpatient Medications on File Prior to Visit  Medication Sig Dispense Refill  . amoxicillin-clavulanate (AUGMENTIN) 875-125 MG tablet Take 1 tablet by mouth 2 (two) times daily. (Patient taking differently: Take 1 tablet by mouth as needed. ) 14 tablet 0  . aspirin EC 81 MG tablet Take 81 mg by mouth daily.    . B Complex-C (B-COMPLEX WITH VITAMIN C) tablet Take 1 tablet  by mouth daily.    . Biotin 5000 MCG SUBL Place 5,000 mcg under the tongue daily.    . Cetirizine HCl (ZYRTEC ALLERGY) 10 MG CAPS TAKE 1 TABLET DAILY ALONG WITH THE LASIX 90 capsule 3  . clonazePAM (KLONOPIN) 0.5 MG tablet Take 0.125-0.25 mg by mouth daily as needed (for anxiety or sleep.).    [name] cyanocobalamin (,VITAMIN B-12,) 1000 MCG/ML injection Inject 1,000 mcg into the muscle every 30 (thirty) days. Pernicious anemia    . Cyanocobalamin (VITAMIN B-12) 5000 MCG SUBL Place 5,000 mcg under the tongue daily.    . cyclopentolate (CYCLODRYL,CYCLOGYL) 2 % ophthalmic solution Place 1 drop into both eyes 3 (three) times daily as needed (for uveitis or iritis).    . fluticasone (FLONASE) 50 MCG/ACT nasal spray as needed.     . Magnesium 250 MG TABS Take 250 mg by mouth every other day.    . Misc Natural Products (FOCUSED MIND PO) Take 1 tablet by mouth every 3 (three) days. FOCUS FACTOR NUTRITIONAL SUPPLEMENT    . Multiple Vitamins-Minerals (ICAPS AREDS 2 PO) Take 1 tablet by mouth every other day.     . Niacinamide-Zinc-Copper-FA (NICOTINAMIDE ZCF PO) Take 1 capsule by mouth daily. Nicotinamide Riboside-Pterostilbene 250-50 mg ([employer])    . OVER THE COUNTER MEDICATION Place 1 each under the tongue daily. 3-4 DROP DAILY (CBD OIL)    . Probiotic Product (PROBIOTIC  PO) Take 1 capsule by mouth once a week.    . traMADol-acetaminophen (ULTRACET) 37.5-325 MG tablet Take 0.5-1 tablets by mouth every 6 (six) hours as needed (for pain.).    [name] TURMERIC PO Take 1 capsule by mouth every other day. CURAMIN     No current facility-administered medications on file prior to visit.     PAST MEDICAL HISTORY: Past Medical History:  Diagnosis Date  . Abnormal nuclear stress test    a. 07/2017: cath with no CAD  . Amyloidosis ([place])   . Anemia   . Crohn disease ([place])   . Crohn's colitis ([place])   . ILD (interstitial lung disease) ([place])   . OSA on CPAP   . Pernicious anemia   .  Polycythemia vera ([place])   . Sinus problem     PAST SURGICAL HISTORY: Past Surgical History:  Procedure Laterality Date  . COLON RESECTION    . HIP ARTHROPLASTY    . LEFT HEART CATH AND CORONARY ANGIOGRAPHY N/A 07/28/2017   Procedure: Left Heart Cath and Coronary Angiography;  Surgeon: Burnell Blanks, MD;  Location: [facility];  Service: Cardiovascular;  Laterality: N/A;    SOCIAL HISTORY: Social History   Tobacco Use  . Smoking status: Former Smoker    Packs/day: 1.00    Years: 45.00    Pack years: 45.00    Types: Cigarettes    Last attempt to quit: 12/26/2008    Years since quitting: 9.4  . Smokeless tobacco: Never Used  Substance Use Topics  . Alcohol use: Yes    Comment: occ  . Drug use: No    FAMILY HISTORY: Family History  Problem Relation Age of Onset  . Glaucoma Mother   . High blood pressure Mother   . High Cholesterol Mother   . Sleep apnea Mother   . Diabetes Mellitus II Father   . Sleep apnea Father   . Anxiety disorder Father   . COPD Brother   . Heart disease Brother   . Non-Hodgkin's lymphoma Brother   . Diabetes Mellitus II Brother   . Glaucoma Brother     ROS: Review of Systems  Constitutional: Positive for weight loss.  Endo/Heme/Allergies:       Negative hypoglycemia Positive polyphagia    PHYSICAL EXAM: Blood pressure 100/66, pulse 86, temperature 98 F (36.7 C), temperature source Oral, height _0  (1.6 m), weight 231 lb (104.8 kg), SpO2 94 %. Body mass index is 40.92 kg/m. Physical Exam  Constitutional: She is oriented to person, place, and time. She appears well-developed and well-nourished.  Cardiovascular: Normal rate.  Pulmonary/Chest: Effort normal.  Musculoskeletal: Normal range of motion.  Neurological: She is oriented to person, place, and time.  Skin: Skin is warm and dry.  Psychiatric: She has a normal mood and affect. Her behavior is normal.  Vitals reviewed.   RECENT LABS AND TESTS: BMET      Component Value Date/Time   NA 141 04/05/2018 1009   K 3.6 04/05/2018 1009   CL 100 04/05/2018 1009   CO2 25 04/05/2018 1009   GLUCOSE 130 (H) 04/05/2018 1009   GLUCOSE 119 (H) 07/29/2017 0553   BUN 11 04/05/2018 1009   CREATININE 0.87 04/05/2018 1009   CALCIUM 9.4 04/05/2018 1009   GFRNONAA 69 04/05/2018 1009   GFRAA 80 04/05/2018 1009   Lab Results  Component Value Date   HGBA1C 6.6 (H) 04/05/2018   Lab Results  Component Value Date   INSULIN 30.6 (H)  04/05/2018   CBC    Component Value Date/Time   WBC 7.1 04/05/2018 1009   RBC 4.94 04/05/2018 1009   HGB 14.7 04/05/2018 1009   HCT 43.6 04/05/2018 1009   PLT 342 07/25/2017 1544   MCV 88 04/05/2018 1009   MCH 29.8 04/05/2018 1009   MCHC 33.7 04/05/2018 1009   RDW 15.3 04/05/2018 1009   LYMPHSABS 1.6 04/05/2018 1009   EOSABS 0.2 04/05/2018 1009   BASOSABS 0.0 04/05/2018 1009   Iron/TIBC/Ferritin/ %Sat    Component Value Date/Time   IRON 111 04/05/2018 1009   TIBC 311 04/05/2018 1009   FERRITIN 71 04/05/2018 1009   IRONPCTSAT 36 04/05/2018 1009   Lipid Panel     Component Value Date/Time   CHOL 183 04/05/2018 1009   TRIG 92 04/05/2018 1009   HDL 54 04/05/2018 1009   LDLCALC 111 (H) 04/05/2018 1009   Hepatic Function Panel     Component Value Date/Time   PROT 6.7 04/05/2018 1009   ALBUMIN 4.0 04/05/2018 1009   AST 46 (H) 04/05/2018 1009   ALT 51 (H) 04/05/2018 1009   ALKPHOS 113 04/05/2018 1009   BILITOT 0.6 04/05/2018 1009      Component Value Date/Time   TSH 0.821 04/05/2018 1009    ASSESSMENT AND PLAN: Type 2 diabetes mellitus without complication, without long-term current use of insulin (HCC)  Class 3 severe obesity with serious comorbidity and body mass index (BMI) of 40.0 to 44.9 in adult, unspecified obesity type ([place])  PLAN:  Diabetes II Patty [name] has been given extensive diabetes education by myself today including ideal fasting and post-prandial blood glucose readings, individual ideal  Hgb A1c goals and hypoglycemia prevention. We discussed the importance of good blood sugar control to decrease the likelihood of diabetic complications such as nephropathy, neuropathy, limb loss, blindness, coronary artery disease, and death. We discussed the importance of intensive lifestyle modification including diet, exercise and weight loss as the first line treatment for diabetes. Patty [name] will continue diet and exercise and we will recheck labs at next visit in 5 to 6 weeks.  We spent > than 50% of the 30 minute visit on the counseling as documented in the note.  Obesity Patty [name] is currently in the action stage of change. As such, her goal is to continue with weight loss efforts She has agreed to follow the Category 2 plan Patty [name] has been instructed to work up to a goal of 150 minutes of combined cardio and strengthening exercise per week for weight loss and overall health benefits. We discussed the following Behavioral Modification Strategies today: increasing lean protein intake, work on meal planning and easy cooking plans, dealing with family or coworker sabotage, holiday eating strategies, travel eating strategies, decrease ETOH, and decrease liquid calories   Patty [name] has agreed to follow up with our clinic in 5 to 6 weeks. She was informed of the importance of frequent follow up visits to maximize her success with intensive lifestyle modifications for her multiple health conditions.   OBESITY BEHAVIORAL INTERVENTION VISIT  Today's visit was # 5 out of 22.  Starting weight: 251 lbs Starting date: 04/05/18 Today's weight : 231 lbs Today's date: 06/14/2018 Total lbs lost to date: 20 (Patients must lose 7 lbs in the first 6 months to continue with counseling)   ASK: We discussed the diagnosis of obesity with Patty [name] today and Patty [name] agreed to give [name] permission to discuss obesity behavioral modification therapy today.  ASSESS: Patty [name] has the diagnosis of obesity  and her BMI today  is 40.93 Patty [name] is in the action stage of change   ADVISE: Patty [name] was educated on the multiple health risks of obesity as well as the benefit of weight loss to improve her health. She was advised of the need for long term treatment and the importance of lifestyle modifications.  AGREE: Multiple dietary modification options and treatment options were discussed and  Patty [name] agreed to the above obesity treatment plan.  I, Trixie Dredge, am acting as transcriptionist for Dennard Nip, MD  I have reviewed the above documentation for accuracy and completeness, and I agree with the above. -Dennard Nip, MD

## 2018-06-21 ENCOUNTER — Encounter: Payer: Self-pay | Admitting: Internal Medicine

## 2018-07-16 ENCOUNTER — Telehealth: Payer: Self-pay | Admitting: Internal Medicine

## 2018-07-16 ENCOUNTER — Encounter: Payer: Self-pay | Admitting: Internal Medicine

## 2018-07-16 DIAGNOSIS — G4733 Obstructive sleep apnea (adult) (pediatric): Secondary | ICD-10-CM

## 2018-07-16 DIAGNOSIS — Z9989 Dependence on other enabling machines and devices: Secondary | ICD-10-CM

## 2018-07-16 NOTE — Telephone Encounter (Signed)
Called and spoke with pt regarding [employer] at 3 liters O2 not working Pt advised in last 6 days, the machine is flashing yellow light not working properly Pt is on vacation in [place] from 06/25/18 - 07/20/2018, flying into [place], then driving to [place] until 08//10/19  Pt states that she contacted [facility] prior to her travel to make them aware she is taking Simply go mini with her out of town/state. Pt arrived to [place] on 06/25/18, on 07/04/18 the machine flashed yellow light stating needs serviced. Pt called [facility] to make aware, [place] is out of there service range, and they are unable to overnight new machine.  Called and spoke with Corene Cornea with [facility] [phone] ext [phone] regarding situation. He stated that since she is less than one year on o2, she could transfer to [place] for O2 as they are international.  Called and spoke with Magda Paganini with [facility] [phone] regarding pt's situation As of 4pm today, the machine stopped working and pt is unable to use the o2; pt uses it with exertion at [employer] advised that if they take pt on as new pt it takes up to 5-7 business days with Medicare They will need RX, snap shot of sats, and last OV notes from [facility] 11/22/17 They are unable to overnight a machine to pt today, and unable to have pt visit [place] location in [place] this week, as they are in need to process the order through through company's protocol.  Last billing cycle from [facility] with insurance for O2 was on 07/04/18; pt can't rec any o2 from [address] until after 08/04/18  Also Magda Paganini with [facility] advised pt could call [facility] [place] at [phone]; spoke with Judson Roch They advised that they provide POC O2 for $325/wk additional week $255 and to overnight is $200  Called and spoke with pt regarding all info above; she was truly appreciative  Pt advised that she is wanting to switch from [facility] to [place] when she gets back home Pt is calling her insurance Medicare tomorrow morning to see if she has any other  options Other than paying out of pocket for new O2 POC Pt did not want to do anything today Advised her if symptoms worsen with breathing seek urgent or ER care in [place] Nothing further needed at this time

## 2018-07-17 ENCOUNTER — Encounter: Payer: Self-pay | Admitting: Internal Medicine

## 2018-07-17 NOTE — Addendum Note (Signed)
Addended by: Georjean Mode on: 07/17/2018 04:44 PM   Modules accepted: Orders

## 2018-07-17 NOTE — Telephone Encounter (Signed)
Received a call back from [facility] with [facility] and was advised pt could reach out to [employer] Engineer, manufacturing with [facility]) at [phone]. I attempted to call Debbie to give her a heads up but was unable to reach her.   Will call back shortly.

## 2018-07-17 NOTE — Telephone Encounter (Signed)
Called and spoke to [facility] with [facility] and was advised she will look into the situation and call back.  Will await her call.

## 2018-07-17 NOTE — Telephone Encounter (Signed)
Called and spoke with pt regarding POC machine Advised pt that we have a message in with Lenna Sciara with [facility] [phone] Waiting on retail supervisor to call patient regarding POC machine issues. Pt advised she is buying her own POC while on vacation out right. Pt advised to send [facility] message having all documents for O2 and cpap machine to be transferred to [place]. Routed message to [facility] today with this request and copied [facility] Nothing further needed.  Routing message to [facility] for review.

## 2018-07-19 ENCOUNTER — Telehealth: Payer: Self-pay | Admitting: Internal Medicine

## 2018-07-19 NOTE — Telephone Encounter (Signed)
Patty [name] with [facility] called back needing rx for O2 faxed today Faxed rx order to fax [phone]; case# [id number] Waiting to hear back from [place]

## 2018-07-19 NOTE — Telephone Encounter (Signed)
Attempted to call patient today regarding LVM for Jim with [facility] today for POC. I did not receive an answer at time of call. I have left a voicemail message for pt to return call. X1  Attempted to call Jim-Salesman with [facility] for pt's new POC she is purchasing herself. Phone for Clair Gulling is [phone]; needing to obtain additional information.  I did not receive an answer at time of call. I have left a voicemail message for pt to return call. X1

## 2018-07-22 DIAGNOSIS — M79662 Pain in left lower leg: Secondary | ICD-10-CM | POA: Diagnosis not present

## 2018-07-22 DIAGNOSIS — L03116 Cellulitis of left lower limb: Secondary | ICD-10-CM | POA: Diagnosis not present

## 2018-07-22 DIAGNOSIS — M79605 Pain in left leg: Secondary | ICD-10-CM | POA: Diagnosis not present

## 2018-07-23 NOTE — Telephone Encounter (Signed)
LVM for Patty [name] with [facility] at phone [phone] regarding rx for O2 faxed last week Faxed rx order to fax [phone]; case# [id number] Waiting to hear back from [place]; will f/u this week.

## 2018-07-24 NOTE — Telephone Encounter (Signed)
LVM for Clair Gulling with [facility] at phone [phone] regarding rx for O2 faxed last week Faxed rx order to fax [phone]; case# [id number] Waiting to hear back from [place]; will f/u this week.  LVM for pt on mobile as she is still on vacation in [place]. Making sure all is okay with new POC. Will f/u later this week.

## 2018-07-24 NOTE — Telephone Encounter (Signed)
Pt called, she has POC working, and all is okay. Nothing further needed at this time.

## 2018-08-03 ENCOUNTER — Telehealth: Payer: Self-pay | Admitting: Internal Medicine

## 2018-08-03 DIAGNOSIS — J849 Interstitial pulmonary disease, unspecified: Secondary | ICD-10-CM

## 2018-08-03 NOTE — Telephone Encounter (Signed)
Called patient unable to reach left message to give [name] a call back.

## 2018-08-03 NOTE — Telephone Encounter (Signed)
Contacted Patty [name] with [facility] to set pt up with there DME company for O2 concentrator Pt travels often, and was with [facility]- transferring to [place] b/c they cover POC out of their area whereas [facility] doesn't. [facility] created acct for pt today, faxed O2 order to Patty [name]'s attention today Patty [name] states she is needing to speak with pt with further verification questions for pt Pt is aware, and will call Patty [name] today with [facility] Nothing further needed from our office at this time.

## 2018-08-03 NOTE — Telephone Encounter (Signed)
Pt is returning all of the equipment to [facility] and she needs her new cpap machine from the new company   [phone]

## 2018-08-06 NOTE — Telephone Encounter (Signed)
O2 order was sent by [facility] on 8/9 and pt will keep CPAP through [facility] Looks like nothing further is needed at this point Will sign off

## 2018-08-07 ENCOUNTER — Ambulatory Visit (INDEPENDENT_AMBULATORY_CARE_PROVIDER_SITE_OTHER): Payer: Medicare Other | Admitting: Family Medicine

## 2018-08-07 VITALS — BP 105/69 | HR 62 | Temp 97.6°F | Ht 63.0 in | Wt 222.0 lb

## 2018-08-07 DIAGNOSIS — E119 Type 2 diabetes mellitus without complications: Secondary | ICD-10-CM

## 2018-08-07 DIAGNOSIS — E559 Vitamin D deficiency, unspecified: Secondary | ICD-10-CM | POA: Diagnosis not present

## 2018-08-07 DIAGNOSIS — Z6839 Body mass index (BMI) 39.0-39.9, adult: Secondary | ICD-10-CM | POA: Diagnosis not present

## 2018-08-07 DIAGNOSIS — E7849 Other hyperlipidemia: Secondary | ICD-10-CM | POA: Diagnosis not present

## 2018-08-07 NOTE — Progress Notes (Signed)
Office: [phone]  /  Fax: [phone]   HPI:   Chief Complaint: OBESITY Patty [name] is here to discuss her progress with her obesity treatment plan. She is on the Category 2 plan and is following her eating plan approximately 80 % of the time. She states she is walking 2-3 miles a day 7 days a week. Patty [name] was on vacation in [place] and increased eating out and celebration eating but she has gotten back on track since she has gotten back.  Her weight is 222 lb (100.7 kg) today and has had a weight loss of 9 pounds over a period of 7 to 8 weeks since her last visit. She has lost 29 lbs since starting treatment with [name].  Diabetes II Patty [name] has a diagnosis of diabetes type II. Patty [name] states she is not checking BGs, last A1c was 6.6. She denies any hypoglycemic episodes.  She has been attempting to diet control her blood glucose levels.  Vitamin D Deficiency Patty [name] has a diagnosis of vitamin D deficiency. She is on OTC Vit D 2-4,000 IU daily, not yet at goal. She denies nausea, vomiting or muscle weakness.  Hyperlipidemia Patty [name] has hyperlipidemia and has been attempting to improve her cholesterol levels with intensive lifestyle modification including a low saturated fat diet, exercise and weight loss. She denies any chest pain, claudication or myalgias.  ALLERGIES: Allergies  Allergen Reactions  . Lasix [Furosemide] Itching    Hypersensitivity   . Onion Other (See Comments)    sereve diarrhea, stomach pains (Crohn's flares)  . Oxycodone Other (See Comments)    Altered mental status  . Potassium-Containing Compounds Other (See Comments)    Due to chron's, difficult passing though kidneys not allowing absorption in the body   . Prednisone Other (See Comments)    Altered mood  . Sulfa Drugs Cross Reactors Other (See Comments)    "feels like bugs crawling on me"    MEDICATIONS: Current Outpatient Medications on File Prior to Visit  Medication Sig Dispense Refill  .  amoxicillin-clavulanate (AUGMENTIN) 875-125 MG tablet Take 1 tablet by mouth 2 (two) times daily. (Patient taking differently: Take 1 tablet by mouth as needed. ) 14 tablet 0  . aspirin EC 81 MG tablet Take 81 mg by mouth daily.    . B Complex-C (B-COMPLEX WITH VITAMIN C) tablet Take 1 tablet by mouth daily.    . Biotin 5000 MCG SUBL Place 5,000 mcg under the tongue daily.    . Cetirizine HCl (ZYRTEC ALLERGY) 10 MG CAPS TAKE 1 TABLET DAILY ALONG WITH THE LASIX 90 capsule 3  . clonazePAM (KLONOPIN) 0.5 MG tablet Take 0.125-0.25 mg by mouth daily as needed (for anxiety or sleep.).    [name] cyanocobalamin (,VITAMIN B-12,) 1000 MCG/ML injection Inject 1,000 mcg into the muscle every 30 (thirty) days. Pernicious anemia    . Cyanocobalamin (VITAMIN B-12) 5000 MCG SUBL Place 5,000 mcg under the tongue daily.    . cyclopentolate (CYCLODRYL,CYCLOGYL) 2 % ophthalmic solution Place 1 drop into both eyes 3 (three) times daily as needed (for uveitis or iritis).    . fluticasone (FLONASE) 50 MCG/ACT nasal spray as needed.     . Magnesium 250 MG TABS Take 250 mg by mouth every other day.    . Misc Natural Products (FOCUSED MIND PO) Take 1 tablet by mouth every 3 (three) days. FOCUS FACTOR NUTRITIONAL SUPPLEMENT    . Multiple Vitamins-Minerals (ICAPS AREDS 2 PO) Take 1 tablet by mouth every other day.     [name]  Niacinamide-Zinc-Copper-FA (NICOTINAMIDE ZCF PO) Take 1 capsule by mouth daily. Nicotinamide Riboside-Pterostilbene 250-50 mg ([employer])    . OVER THE COUNTER MEDICATION Place 1 each under the tongue daily. 3-4 DROP DAILY (CBD OIL)    . Probiotic Product (PROBIOTIC PO) Take 1 capsule by mouth once a week.    . traMADol-acetaminophen (ULTRACET) 37.5-325 MG tablet Take 0.5-1 tablets by mouth every 6 (six) hours as needed (for pain.).    [name] TURMERIC PO Take 1 capsule by mouth every other day. CURAMIN     No current facility-administered medications on file prior to visit.     PAST  MEDICAL HISTORY: Past Medical History:  Diagnosis Date  . Abnormal nuclear stress test    a. 07/2017: cath with no CAD  . Amyloidosis ([place])   . Anemia   . Crohn disease ([place])   . Crohn's colitis ([place])   . ILD (interstitial lung disease) ([place])   . OSA on CPAP   . Pernicious anemia   . Polycythemia vera ([place])   . Sinus problem     PAST SURGICAL HISTORY: Past Surgical History:  Procedure Laterality Date  . COLON RESECTION    . HIP ARTHROPLASTY    . LEFT HEART CATH AND CORONARY ANGIOGRAPHY N/A 07/28/2017   Procedure: Left Heart Cath and Coronary Angiography;  Surgeon: Burnell Blanks, MD;  Location: [facility];  Service: Cardiovascular;  Laterality: N/A;    SOCIAL HISTORY: Social History   Tobacco Use  . Smoking status: Former Smoker    Packs/day: 1.00    Years: 45.00    Pack years: 45.00    Types: Cigarettes    Last attempt to quit: 12/26/2008    Years since quitting: 9.6  . Smokeless tobacco: Never Used  Substance Use Topics  . Alcohol use: Yes    Comment: occ  . Drug use: No    FAMILY HISTORY: Family History  Problem Relation Age of Onset  . Glaucoma Mother   . High blood pressure Mother   . High Cholesterol Mother   . Sleep apnea Mother   . Diabetes Mellitus II Father   . Sleep apnea Father   . Anxiety disorder Father   . COPD Brother   . Heart disease Brother   . Non-Hodgkin's lymphoma Brother   . Diabetes Mellitus II Brother   . Glaucoma Brother     ROS: Review of Systems  Constitutional: Positive for weight loss.  Cardiovascular: Negative for chest pain and claudication.  Gastrointestinal: Negative for nausea and vomiting.  Musculoskeletal: Negative for myalgias.       Negative muscle weakness  Endo/Heme/Allergies:       Negative hypoglycemia    PHYSICAL EXAM: Blood pressure 105/69, pulse 62, temperature 97.6 F (36.4 C), temperature source Oral, height _0  (1.6 m), weight 222 lb (100.7 kg), SpO2 94 %. Body mass index is 39.33  kg/m. Physical Exam  Constitutional: She is oriented to person, place, and time. She appears well-developed and well-nourished.  Cardiovascular: Normal rate.  Pulmonary/Chest: Effort normal.  Musculoskeletal: Normal range of motion.  Neurological: She is oriented to person, place, and time.  Skin: Skin is warm and dry.  Psychiatric: She has a normal mood and affect. Her behavior is normal.  Vitals reviewed.   RECENT LABS AND TESTS: BMET    Component Value Date/Time   NA 141 04/05/2018 1009   K 3.6 04/05/2018 1009   CL 100 04/05/2018 1009   CO2 25 04/05/2018  1009   GLUCOSE 130 (H) 04/05/2018 1009   GLUCOSE 119 (H) 07/29/2017 0553   BUN 11 04/05/2018 1009   CREATININE 0.87 04/05/2018 1009   CALCIUM 9.4 04/05/2018 1009   GFRNONAA 69 04/05/2018 1009   GFRAA 80 04/05/2018 1009   Lab Results  Component Value Date   HGBA1C 6.6 (H) 04/05/2018   Lab Results  Component Value Date   INSULIN 30.6 (H) 04/05/2018   CBC    Component Value Date/Time   WBC 7.1 04/05/2018 1009   RBC 4.94 04/05/2018 1009   HGB 14.7 04/05/2018 1009   HCT 43.6 04/05/2018 1009   PLT 342 07/25/2017 1544   MCV 88 04/05/2018 1009   MCH 29.8 04/05/2018 1009   MCHC 33.7 04/05/2018 1009   RDW 15.3 04/05/2018 1009   LYMPHSABS 1.6 04/05/2018 1009   EOSABS 0.2 04/05/2018 1009   BASOSABS 0.0 04/05/2018 1009   Iron/TIBC/Ferritin/ %Sat    Component Value Date/Time   IRON 111 04/05/2018 1009   TIBC 311 04/05/2018 1009   FERRITIN 71 04/05/2018 1009   IRONPCTSAT 36 04/05/2018 1009   Lipid Panel     Component Value Date/Time   CHOL 183 04/05/2018 1009   TRIG 92 04/05/2018 1009   HDL 54 04/05/2018 1009   LDLCALC 111 (H) 04/05/2018 1009   Hepatic Function Panel     Component Value Date/Time   PROT 6.7 04/05/2018 1009   ALBUMIN 4.0 04/05/2018 1009   AST 46 (H) 04/05/2018 1009   ALT 51 (H) 04/05/2018 1009   ALKPHOS 113 04/05/2018 1009   BILITOT 0.6 04/05/2018 1009      Component Value Date/Time     TSH 0.821 04/05/2018 1009  Results for ESTRELLITA, LASKY (MRN [id number]) as of 08/07/2018 13:26  Ref. Range 04/05/2018 10:09  Vitamin D, 25-Hydroxy Latest Ref Range: 30.0 - 100.0 ng/mL 27.5 (L)    ASSESSMENT AND PLAN: Type 2 diabetes mellitus without complication, without long-term current use of insulin (HCC) - Plan: Comprehensive metabolic panel, Hemoglobin A1c, Insulin, random  Vitamin D deficiency - Plan: VITAMIN D 25 Hydroxy (Vit-D Deficiency, Fractures)  Other hyperlipidemia - Plan: Lipid Panel With LDL/HDL Ratio  Class 2 severe obesity with serious comorbidity and body mass index (BMI) of 39.0 to 39.9 in adult, unspecified obesity type ([place])  PLAN:  Diabetes II Patty [name] has been given extensive diabetes education by myself today including ideal fasting and post-prandial blood glucose readings, individual ideal Hgb A1c goals and hypoglycemia prevention. We discussed the importance of good blood sugar control to decrease the likelihood of diabetic complications such as nephropathy, neuropathy, limb loss, blindness, coronary artery disease, and death. We discussed the importance of intensive lifestyle modification including diet, exercise and weight loss as the first line treatment for diabetes. We will check labs and she will continue with diet and exercise. Patty [name] agrees to follow up with our clinic in 8 weeks.  Vitamin D Deficiency Patty [name] was informed that low vitamin D levels contributes to fatigue and are associated with obesity, breast, and colon cancer. Patty [name] agrees to continue taking OTC Vit D 4,000 IU daily and will follow up for routine testing of vitamin D, at least 2-3 times per year. She was informed of the risk of over-replacement of vitamin D and agrees to not increase her dose unless she discusses this with [name] first. We will check labs and Patty [name] agrees to follow up with our clinic in 8 weeks.  Hyperlipidemia Patty [name] was informed of the American Heart Association Guidelines  emphasizing intensive lifestyle modifications as the first line treatment for hyperlipidemia. We discussed many lifestyle modifications today in depth, and Patty [name] will continue to work on decreasing saturated fats such as fatty red meat, butter and many fried foods. She will also increase vegetables and lean protein in her diet and continue to work on diet, exercise, and weight loss efforts. We will check labs and Patty [name] agrees to follow up with our clinic in 8 weeks.  Obesity Patty [name] is currently in the action stage of change. As such, her goal is to continue with weight loss efforts She has agreed to follow the Category 2 plan Patty [name] has been instructed to work up to a goal of 150 minutes of combined cardio and strengthening exercise per week or back to the gym for 15 minutes or more for weight loss and overall health benefits. We discussed the following Behavioral Modification Strategies today: increasing lean protein intake, work on meal planning and easy cooking plans, and keeping healthy foods in the home   Patty [name] has agreed to follow up with our clinic in 8 weeks. She was informed of the importance of frequent follow up visits to maximize her success with intensive lifestyle modifications for her multiple health conditions.   OBESITY BEHAVIORAL INTERVENTION VISIT  Today's visit was # 6 out of 22.  Starting weight: 251 lbs Starting date: 04/05/18 Today's weight : 222 lbs  Today's date: 08/07/2018 Total lbs lost to date: 30    ASK: We discussed the diagnosis of obesity with Patty [name] today and Patty [name] agreed to give [name] permission to discuss obesity behavioral modification therapy today.  ASSESS: Patty [name] has the diagnosis of obesity and her BMI today is 39.34 Patty [name] is in the action stage of change   ADVISE: Patty [name] was educated on the multiple health risks of obesity as well as the benefit of weight loss to improve her health. She was advised of the need for long term treatment and the  importance of lifestyle modifications.  AGREE: Multiple dietary modification options and treatment options were discussed and  Patty [name] agreed to the above obesity treatment plan.  I, Trixie Dredge, am acting as transcriptionist for Dennard Nip, MD  I have reviewed the above documentation for accuracy and completeness, and I agree with the above. -Dennard Nip, MD

## 2018-08-07 NOTE — Telephone Encounter (Signed)
O2 order was sent by [facility] on 8/9 and pt will keep CPAP through [facility] Looks like nothing further is needed at this point Will sign off

## 2018-08-08 LAB — COMPREHENSIVE METABOLIC PANEL
ALT: 20 IU/L (ref 0–32)
AST: 17 IU/L (ref 0–40)
Albumin/Globulin Ratio: 1.6 (ref 1.2–2.2)
Albumin: 4.2 g/dL (ref 3.6–4.8)
Alkaline Phosphatase: 83 IU/L (ref 39–117)
BUN/Creatinine Ratio: 32 — ABNORMAL HIGH (ref 12–28)
BUN: 27 mg/dL (ref 8–27)
Bilirubin Total: 0.6 mg/dL (ref 0.0–1.2)
CO2: 23 mmol/L (ref 20–29)
Calcium: 9.6 mg/dL (ref 8.7–10.3)
Chloride: 106 mmol/L (ref 96–106)
Creatinine, Ser: 0.84 mg/dL (ref 0.57–1.00)
GFR calc Af Amer: 83 mL/min/{1.73_m2} (ref >59)
GFR calc non Af Amer: 72 mL/min/{1.73_m2} (ref >59)
Globulin, Total: 2.7 g/dL (ref 1.5–4.5)
Glucose: 95 mg/dL (ref 65–99)
Potassium: 4.8 mmol/L (ref 3.5–5.2)
Sodium: 143 mmol/L (ref 134–144)
Total Protein: 6.9 g/dL (ref 6.0–8.5)

## 2018-08-08 LAB — LIPID PANEL WITH LDL/HDL RATIO
Cholesterol, Total: 165 mg/dL (ref 100–199)
HDL: 53 mg/dL (ref >39)
LDL Calculated: 98 mg/dL (ref 0–99)
LDl/HDL Ratio: 1.8 ratio (ref 0.0–3.2)
Triglycerides: 70 mg/dL (ref 0–149)
VLDL Cholesterol Cal: 14 mg/dL (ref 5–40)

## 2018-08-08 LAB — HEMOGLOBIN A1C
Est. average glucose Bld gHb Est-mCnc: 123 mg/dL
Hgb A1c MFr Bld: 5.9 % — ABNORMAL HIGH (ref 4.8–5.6)

## 2018-08-08 LAB — VITAMIN D 25 HYDROXY (VIT D DEFICIENCY, FRACTURES): Vit D, 25-Hydroxy: 43.5 ng/mL (ref 30.0–100.0)

## 2018-08-08 LAB — INSULIN, RANDOM: INSULIN: 16.4 u[IU]/mL (ref 2.6–24.9)

## 2018-08-08 NOTE — Telephone Encounter (Signed)
Pt returning call says Patty [name] has been trying to reach her she can be reached _[phone].Hillery Hunter

## 2018-08-09 DIAGNOSIS — R3129 Other microscopic hematuria: Secondary | ICD-10-CM | POA: Diagnosis not present

## 2018-08-09 DIAGNOSIS — S339XXA Sprain of unspecified parts of lumbar spine and pelvis, initial encounter: Secondary | ICD-10-CM | POA: Diagnosis not present

## 2018-08-14 ENCOUNTER — Encounter (INDEPENDENT_AMBULATORY_CARE_PROVIDER_SITE_OTHER): Payer: Self-pay | Admitting: Family Medicine

## 2018-08-23 NOTE — Telephone Encounter (Signed)
Left message to contact our office. Patient needs to set up an appt to be compliant with medicare per [facility] for cpap and oxygen use. If patient has questions please request Katie.

## 2018-08-28 ENCOUNTER — Ambulatory Visit (INDEPENDENT_AMBULATORY_CARE_PROVIDER_SITE_OTHER): Payer: Medicare Other | Admitting: Internal Medicine

## 2018-08-28 ENCOUNTER — Encounter: Payer: Self-pay | Admitting: Internal Medicine

## 2018-08-28 VITALS — BP 120/70 | HR 80 | Ht 63.25 in | Wt 223.0 lb

## 2018-08-28 DIAGNOSIS — J849 Interstitial pulmonary disease, unspecified: Secondary | ICD-10-CM

## 2018-08-28 DIAGNOSIS — Z23 Encounter for immunization: Secondary | ICD-10-CM | POA: Diagnosis not present

## 2018-08-28 LAB — PULMONARY FUNCTION TEST
DL/VA % pred: 65 %
DL/VA: 3.1 ml/min/mmHg/L
DLCO unc % pred: 62 %
DLCO unc: 14.53 ml/min/mmHg
FEF 25-75 Pre: 1.31 L/sec
FEF2575-%Pred-Pre: 67 %
FEV1-%Pred-Pre: 86 %
FEV1-Pre: 1.96 L
FEV1FVC-%Pred-Pre: 92 %
FEV6-%Pred-Pre: 97 %
FEV6-Pre: 2.78 L
FEV6FVC-%Pred-Pre: 103 %
FVC-%Pred-Pre: 94 %
FVC-Pre: 2.8 L
Pre FEV1/FVC ratio: 70 %
Pre FEV6/FVC Ratio: 99 %

## 2018-08-28 NOTE — Progress Notes (Signed)
Subjective:     Patient ID: Patty [name], female   DOB: 02/27/1951, 67 y.o.   MRN: [id number]  HPI   _0  ID: Patty [name], female    DOB: August 01, 1951, 67 y.o.   MRN: [id number]  Chief Complaint  Patient presents with  . Follow-up    dyspnea     Referring provider: No ref. provider found  HPI: 67 year old female former smoker seen for pulmonary consulokay guarded because then maybe they can meet him right now for the registryt 05/09/2017 for progressive dyspnea since 2004.[name] Patient has polycythemia vera followed by hematology and [place] She has Crohn's disease    IOV  05/09/2017  Chief Complaint  Patient presents with  . Pulmonary Consult    Pt referred for SOB with activity x years. Pt states over the last year the DOE has worsened. Pt denies cough and CP/tightness and f/c/s.     67 year old obese female. In 2004 Started noticing insidious onset of shortness of breath. In 2005 more [place] from [place] and probably gain over 60 pounds of weight. She has extensive workup at that time according to her history. She was then diagnosed with polycythemia by Dr. [name] at [facility]. She does not recollect any phlebotomies for this. However in the last 4-5 years she's had worsening shortness of breath. Noticeable with exertion particularly in the gym and doing standing exercises but not so much with sitting exercises. Relieved by rest. When she does some yard work she notices some associated wheezing as well.  Walking desaturation test in the office she did desaturate to 88% and did get tachycardic. She says that a chest x-ray done by primary care physicians interstitial pulmonary fibrosis. I personally visualized the chest x-ray done 04/03/2017: There is some interstitial markings but it is obscured by her obesity. I'm not so certain that is definite ILD. There is no lab work in the record. There is no PFT a CT scan available for visualization     05/29/2017 Follow up :  Dyspnea  Patient returns for a two-week follow-up. Patient was seen for pulmonary consult 05/09/2017 for progressive dyspnea since 2004. Patient was set up for a high resolution CT chest that showed a very mild basilar predominant subpleural reticulation and bronchiectasis which could be due to nonspecific interstitial pneumonitis.. CT did have incidental findings of enlarged pulmonary arteries and aortic and coronary calcification. Patient had pulmonary function test on May 23 that showed an FEV1 at 77%, ratio 73, FVC 81, no significant bronchodilator response, total lung capacity 100%, DLCO 52%. Overnight oximetry test did show significant desaturations. We discussed beginning oxygen at bedtime.  Patient denies significant cough. Says that she get short of breath with walking. Has daytime fatigue and low energy..  Interstitial lung disease patient questionnaire was completed as follows Patient has no previous use of Macrodantin, amiodarone, methotrexate. She has been treated with prednisone for her Crohn's in the past. Patient has had extensive travel with brief visits to multiple state and country's.. [place]. Did live in a apartment that she did notice mold for 4 years. She does have Crohn's disease and was on Mesamaline for many years. Patient says she has had a humidifier and hot tub and previous house. She has no indoor birds. She does have a dog. Patient says she was told that she had a blood clot in the past but was never found.   OV 08/15/2017  Chief Complaint  Patient presents with  .  Follow-up    Pt still gets occ. SOB. Other than that, pt states that she has been doing good. Denies any cough or CP.     Follow-up multifactorial dyspnea with interstitial lung disease concern.   Regarding interstitial lung disease concern: Mid May 2018 she had high resolution CT chest that showed interstitial lung disease very mild but unclear if possible UIP pattern. Autoimmune test  was negative. Isolated reduction in diffusion capacity 50%. [employer] of chest physicians questionnaire shows previous Crohn's disease and also mold exposure previously. Overall she feels stable  Regarding dyspnea: She underwent echocardiogram in June 2018 showed grade 1 diastolic dysfunction. Had abnormal cardiac stress test July 2018 followed by cardiac cath early August 2018 that showed normal coronary angiogram but elevated left ventricular end-diastolic pressure. Dietary therapy has been recommended but she is unaware of these results.  Overall she is here to discuss these results.  IMPRESSION: 1. Suspect very mild basilar predominant subpleural reticulation and bronchiolectasis, which may be due to nonspecific interstitial pneumonitis. 2. Aortic atherosclerosis (ICD10-170.0). Coronary artery calcification. 3. Enlarged pulmonary arteries, indicative of pulmonary arterial hypertension.   Electronically Signed   By: Lorin Picket M.D.   On: 05/13/2017 07:33   OV 10/17/2017  Chief Complaint  Patient presents with  . Follow-up    Pt states that she has had good days and bad days since last visit. States that when she was in [place] for a month, breathing was better; still became SOB but not as bad as in [place]. Pt's SOB is mainly on exertion. Denies any cough or CP.    Follow-up dyspnea that is multifactorial due to obesity, physical deconditioning and diastolic dysfunction Follow-up mild interstitial lung disease not otherwise specified  Last visit I started her on Lasix. I was only supposed to see herin 6 months or so. However she's had problems tolerating Lasix and potassium. It fluctuates between palpitations and hypertension. She feels the palpitations might be related to potassium depletion after taking Lasix. But she also tells me that the Lasix does help her dyspnea. At this point in time her condition is to see cardiologist. Off note she did send the email message  asking these questions on 09/28/2017 made a reply that she tells me that she never got the reply.   OV 02/13/2018  Chief Complaint  Patient presents with  . Follow-up    PFT done 02/05/18.  Pt states she has been doing good. Was sick in january but states she is doing better.  DME: [facility] 4L pulse    Follow-up dyspnea that is multifactorial due to obesity, physical deconditioning and diastolic dysfunction Follow-up mild interstitial lung disease not otherwise specified v concern   67 year old female with concern for interstitial lung disease.  Since her last visit she continues to do well.  She is attending pulmonary rehabilitation.  She feels better and less short of breath.  She uses oxygen with exertion at rehab saying she needs it.  She has upcoming travel in summer to [place] and wanted some flight information oxygen form filled out.  She is wondering if mesalamine that she took several years ago was the cause of interstitial lung disease or her Crohn's disease.  But overall she is stable.  Pulmonary function test shows stability and FVC since last 1 year with some improvement in DLCO.  High-resolution CT scan of the chest interpreted by thoracic radiology report suspected ILD.   OV 08/28/2018  Chief Complaint  Patient presents with  .  Follow-up    PFT performed today.  Pt states she has had both good and bad days since last visit. Pt denies any complaints of cough, SOB, or CP but states she is having some problems with congestion since she has been back from vacation. Pt has been having problems with getting O2 supplies taken care of with DME.    Follow-up dyspnea that is multifactorial due to obesity, physical deconditioning and diastolic dysfunction Follow-up concern for  mild interstitial lung disease not otherwise specified  Patty [name] - Presents for follow-up for the above issues. Her dyspnea significantly better following a 30 pound weight loss and continued rehabilitation. She  uses oxygen with rehabilitation. Today walking desaturation test 185 feet 3 laps on room air: She dropped to 87% on the second lap. But she is feeling better. She had spirometry today that shows improvement in FVC concomitant with weight loss but no change in diffusion capacity that reflects the presence of ongoing possible ILD.She continues to lose more weight she believes that she could lose another 30 pounds before the next visit.      Results for LYNSIE, MCWATTERS (MRN [id number]) as of 08/28/2018 12:09  Ref. Range 05/17/2017 11:47 02/05/2018 12:14 08/28/2018 10:59  FVC-Pre Latest Units: L 2.61 2.57 2.80  FVC-%Pred-Pre Latest Units: % 84 83 94   Results for ROCHELLE, LARUE (MRN [id number]) as of 08/28/2018 12:09  Ref. Range 05/17/2017 11:47 02/05/2018 12:14 08/28/2018 10:59  DLCO unc Latest Units: ml/min/mmHg 12.64 15.15 14.53  DLCO unc % pred Latest Units: % 52 62 62   IMPRESSION: 1. Suspect mild basilar subpleural reticulation and ground-glass, as on 05/12/2017, which may be due to nonspecific interstitial pneumonitis. Usual interstitial pneumonitis is not excluded. 2. Aortic atherosclerosis (ICD10-170.0). Coronary artery calcification. 3. Enlarged pulmonary arteries, indicative of pulmonary arterial hypertension.   Electronically Signed   By: Lorin Picket M.D.   On: 01/29/2018 15:25    has a past medical history of Abnormal nuclear stress test, Amyloidosis ([place]), Anemia, Crohn disease ([place]), Crohn's colitis ([place]), ILD (interstitial lung disease) ([place]), OSA on CPAP, Pernicious anemia, Polycythemia vera ([place]), and Sinus problem.   reports that she quit smoking about 9 years ago. Her smoking use included cigarettes. She has a 45.00 pack-year smoking history. She has never used smokeless tobacco.  Past Surgical History:  Procedure Laterality Date  . COLON RESECTION    . HIP ARTHROPLASTY    . LEFT HEART CATH AND CORONARY ANGIOGRAPHY N/A 07/28/2017   Procedure: Left Heart Cath and  Coronary Angiography;  Surgeon: Burnell Blanks, MD;  Location: [facility];  Service: Cardiovascular;  Laterality: N/A;    Allergies  Allergen Reactions  . Lasix [Furosemide] Itching    Hypersensitivity   . Onion Other (See Comments)    sereve diarrhea, stomach pains (Crohn's flares)  . Oxycodone Other (See Comments)    Altered mental status  . Potassium-Containing Compounds Other (See Comments)    Due to chron's, difficult passing though kidneys not allowing absorption in the body   . Prednisone Other (See Comments)    Altered mood  . Sulfa Drugs Cross Reactors Other (See Comments)    "feels like bugs crawling on me"    Immunization History  Administered Date(s) Administered  . Influenza-Unspecified 11/23/2017  . Pneumococcal Conjugate-13 02/13/2018    Family History  Problem Relation Age of Onset  . Glaucoma Mother   . High blood pressure Mother   . High Cholesterol Mother   . Sleep apnea  Mother   . Diabetes Mellitus II Father   . Sleep apnea Father   . Anxiety disorder Father   . COPD Brother   . Heart disease Brother   . Non-Hodgkin's lymphoma Brother   . Diabetes Mellitus II Brother   . Glaucoma Brother      Current Outpatient Medications:  .  aspirin EC 81 MG tablet, Take 81 mg by mouth daily., Disp: , Rfl:  .  B Complex-C (B-COMPLEX WITH VITAMIN C) tablet, Take 1 tablet by mouth daily., Disp: , Rfl:  .  Biotin 5000 MCG SUBL, Place 5,000 mcg under the tongue daily., Disp: , Rfl:  .  Cetirizine HCl (ZYRTEC ALLERGY) 10 MG CAPS, TAKE 1 TABLET DAILY ALONG WITH THE LASIX, Disp: 90 capsule, Rfl: 3 .  clonazePAM (KLONOPIN) 0.5 MG tablet, Take 0.125-0.25 mg by mouth daily as needed (for anxiety or sleep.)., Disp: , Rfl:  .  cyanocobalamin (,VITAMIN B-12,) 1000 MCG/ML injection, Inject 1,000 mcg into the muscle every 30 (thirty) days. Pernicious anemia, Disp: , Rfl:  .  Cyanocobalamin (VITAMIN B-12) 5000 MCG SUBL, Place 5,000 mcg under the tongue daily.,  Disp: , Rfl:  .  cyclopentolate (CYCLODRYL,CYCLOGYL) 2 % ophthalmic solution, Place 1 drop into both eyes 3 (three) times daily as needed (for uveitis or iritis)., Disp: , Rfl:  .  fluticasone (FLONASE) 50 MCG/ACT nasal spray, as needed. , Disp: , Rfl:  .  Magnesium 250 MG TABS, Take 250 mg by mouth every other day., Disp: , Rfl:  .  Multiple Vitamins-Minerals (ICAPS AREDS 2 PO), Take 1 tablet by mouth every other day. , Disp: , Rfl:  .  Niacinamide-Zinc-Copper-FA (NICOTINAMIDE ZCF PO), Take 1 capsule by mouth daily. Nicotinamide Riboside-Pterostilbene 250-50 mg ([employer]), Disp: , Rfl:  .  OVER THE COUNTER MEDICATION, Place 1 each under the tongue daily. 3-4 DROP DAILY (CBD OIL), Disp: , Rfl:  .  Probiotic Product (PROBIOTIC PO), Take 1 capsule by mouth once a week., Disp: , Rfl:  .  traMADol-acetaminophen (ULTRACET) 37.5-325 MG tablet, Take 0.5-1 tablets by mouth every 6 (six) hours as needed (for pain.)., Disp: , Rfl:  .  amoxicillin-clavulanate (AUGMENTIN) 875-125 MG tablet, Take 1 tablet by mouth 2 (two) times daily. (Patient not taking: Reported on 08/28/2018), Disp: 14 tablet, Rfl: 0   Review of Systems     Objective:   Physical Exam  Constitutional: She is oriented to person, place, and time. She appears well-developed and well-nourished. No distress.  HENT:  Head: Normocephalic and atraumatic.  Right Ear: External ear normal.  Left Ear: External ear normal.  Mouth/Throat: Oropharynx is clear and moist. No oropharyngeal exudate.  Eyes: Pupils are equal, round, and reactive to light. Conjunctivae and EOM are normal. Right eye exhibits no discharge. Left eye exhibits no discharge. No scleral icterus.  Neck: Normal range of motion. Neck supple. No JVD present. No tracheal deviation present. No thyromegaly present.  Cardiovascular: Normal rate, regular rhythm, normal heart sounds and intact distal pulses. Exam reveals no gallop and no friction rub.  No murmur  heard. Pulmonary/Chest: Effort normal. No respiratory distress. She has no wheezes. She has rales. She exhibits no tenderness.  Abdominal: Soft. Bowel sounds are normal. She exhibits no distension and no mass. There is no tenderness. There is no rebound and no guarding.  Obese abdomen  Musculoskeletal: Normal range of motion. She exhibits no edema or tenderness.  Lymphadenopathy:    She has no cervical adenopathy.  Neurological: She  is alert and oriented to person, place, and time. She has normal reflexes. No cranial nerve deficit. She exhibits normal muscle tone. Coordination normal.  Skin: Skin is warm and dry. No rash noted. She is not diaphoretic. No erythema. No pallor.  Psychiatric: She has a normal mood and affect. Her behavior is normal. Judgment and thought content normal.  Vitals reviewed.  Vitals:   08/28/18 1159  BP: 120/70  Pulse: 80  SpO2: 94%  Weight: 223 lb (101.2 kg)  Height: 5' 3.25" (1.607 m)   Estimated body mass index is 39.19 kg/m as calculated from the following:   Height as of this encounter: 5' 3.25" (1.607 m).   Weight as of this encounter: 223 lb (101.2 kg).     Assessment:       ICD-10-CM   1. ILD (interstitial lung disease) ([place]) J84.9        Plan:      Improving vital capacoity might be related to weight loss Diffusion capacity which reflects likely ILD is stable and unchanged at 62% OVerall glad you are feeling  Better and stable  Plan - let us see what continued wiehgt loss will bring and if it will improved your breathing tests - in 6 months do - Pre-bd spiro and dlco only. No lung volume or bd response. No post-bd spiro - in 6 months do  - HRCT  Followup ILD clinic in 6 months      SIGNATURE    Dr. Brand Males, M.D., F.C.C.P,  Pulmonary and Critical Care Medicine Staff Physician, [place] Director - Interstitial Lung Disease  Program  Pulmonary [place] [facility] [place], [place], [address]  Pager: [phone], If no answer or between  15:00h - 7:00h: call [phone] Telephone: [phone]  12:22 PM 08/28/2018

## 2018-08-28 NOTE — Addendum Note (Signed)
Addended by: Lorretta Harp on: 08/28/2018 12:27 PM   Modules accepted: Orders

## 2018-08-28 NOTE — Progress Notes (Signed)
Spirometry and Dlco done today.

## 2018-08-28 NOTE — Patient Instructions (Addendum)
ICD-10-CM   1. ILD (interstitial lung disease) ([place]) J84.9     Improving vital capacoity might be related to weight loss Diffusion capacity which reflects likely ILD is stable and unchanged at 62% OVerall glad you are feeling  Better and stable  Plan - high dose flu shot 08/28/2018 - let us see what continued wiehgt loss will bring and if it will improved your breathing tests - in 6 months do - Pre-bd spiro and dlco only. No lung volume or bd response. No post-bd spiro - in 6 months do  - HRCT  Followup ILD clinic in 6 months

## 2018-09-18 ENCOUNTER — Telehealth: Payer: Self-pay | Admitting: Internal Medicine

## 2018-09-18 NOTE — Telephone Encounter (Signed)
Attempted to call pt. I did not receive an answer. I have left a message for pt to return our call.  

## 2018-09-19 NOTE — Telephone Encounter (Signed)
Called patient unable to reach left message to give [name] a call back.

## 2018-09-19 NOTE — Telephone Encounter (Signed)
Pt is calling back [phone]

## 2018-09-19 NOTE — Telephone Encounter (Signed)
Pt is calling back [phone] 

## 2018-09-19 NOTE — Telephone Encounter (Signed)
lmtcb for pt.  

## 2018-09-19 NOTE — Telephone Encounter (Signed)
Attempted to call pt. I did not receive an answer. I have left a message for pt to return our call.  

## 2018-09-20 NOTE — Telephone Encounter (Signed)
Attempted to call pt. I did not receive an answer. I have left a message for pt to return our call.  

## 2018-09-21 NOTE — Telephone Encounter (Signed)
Spoke with [employer] about the pt's oxygen and they stated they are not able to service the patient due to [facility] billing. The pt has been with [facility] for over 10 months so they paid them several months for her oxygen, therefore, [employer] will not get paid for their services because Medicare will deny it.   I spoke with Melissa at [facility] and she stated that the patient was set to transfer if she wanted to but the only reason she doesn't have her oxygen is because she dropped off the equipment to them and stated she didn't want their services.  I called the pt, she was really upset but I explained all of the issues that were told to me from both DME companies. I told her that I could send a new order to [facility] to re start service but pt insisted and stated she is writing a letter of complaint to their corporate office. This is the only solution since she cannot switch service to [place]. I am out of options at this point unless the patient wants to pay cash for services.

## 2018-09-21 NOTE — Telephone Encounter (Signed)
Attempted to call pt. I did not receive an answer. I have left a message for pt to return our call.  

## 2018-09-21 NOTE — Telephone Encounter (Signed)
Spoke with pt and advised the only option is to be serviced through [facility] until the period ends for payment with Medicare. Pt wanted me to hold off placing the order so she can call her insurance to get more information on the matter. I will keep encounter open so we can get an update from pt.

## 2018-09-24 NOTE — Telephone Encounter (Signed)
Unable to contact [employer], they are close [facility] tomorrow

## 2018-09-25 NOTE — Telephone Encounter (Signed)
There is a MyChart message open on this same matter. We received this message from the pt yesterday: Hi [name] - I spoke with Jeannette and apparently I own my CPAP machine now (13 months of payments) and am not getting any supplies from them or renting any equipment any longer. They were billing me for the concentrator even though I returned it but accounting took that off my accounts today so I should be able to go to any company now as a new patient not a transition. I had been trying for 2 months to order CPAP supplies but I have canceled that as well since they want me to start a whole new order.  Please let me know what [employer] says and please give me a person and phone number of someone I can reach without being n hold for 30-40 minutes. I tried for a month to contact someone and this happened every time and ended up being hung up on if I continued to wait on hold. You can call me today on the [phone] number.  Thank you,  Patty [name]  --------------------------------------------------------- Attempted to call [employer]. They are not open yet. Will route to triage to follow up on.

## 2018-09-25 NOTE — Telephone Encounter (Signed)
See telephone encounter (09/18/18)

## 2018-09-25 NOTE — Telephone Encounter (Signed)
Called patient unable to reach left message to give [name] a call back.

## 2018-09-25 NOTE — Telephone Encounter (Signed)
Called and spoke with Patty [name]).  She states the patient still has to go with [facility] and cannot use [employer].  Kenney [name] states patient can call [phone] and ask for Patty [name].  Tried calling patient unable to reach, left message to call back.

## 2018-09-26 NOTE — Telephone Encounter (Signed)
Attempted to call pt. I did not receive an answer. I have left a message for pt to return our call.  

## 2018-09-27 NOTE — Telephone Encounter (Signed)
This message has been handled per the pt's MyChart message from 09/21/18. Message will be closed per triage protocol.

## 2018-10-04 ENCOUNTER — Ambulatory Visit (INDEPENDENT_AMBULATORY_CARE_PROVIDER_SITE_OTHER): Payer: Medicare Other | Admitting: Family Medicine

## 2018-10-04 VITALS — BP 124/72 | HR 62 | Temp 97.5°F | Ht 63.0 in | Wt 219.0 lb

## 2018-10-04 DIAGNOSIS — E119 Type 2 diabetes mellitus without complications: Secondary | ICD-10-CM | POA: Diagnosis not present

## 2018-10-04 DIAGNOSIS — Z6838 Body mass index (BMI) 38.0-38.9, adult: Secondary | ICD-10-CM | POA: Diagnosis not present

## 2018-10-08 NOTE — Progress Notes (Signed)
Office: [phone]  /  Fax: [phone]   HPI:   Chief Complaint: OBESITY Patty [name] is here to discuss her progress with her obesity treatment plan. She is on the Category 2 plan and is following her eating plan approximately 70 % of the time. She states she is walking 1-3 miles 2 times per week. Patty [name]'s last visit was 2 months ago. She is doing well with weight loss even with increased traveling and eating out. She was mindful of her food choices.  Her weight is 219 lb (99.3 kg) today and has had a weight loss of 3 pounds over a period of 8 weeks since her last visit. She has lost 32 lbs since starting treatment with [name].  Diabetes II Patty [name] has a diagnosis of diabetes type II. Patty [name] didn't tolerate metofmrin well and is now just attempting to diet control. She denies any hypoglycemic episodes. Last A1c was 5.9. She has been working on intensive lifestyle modifications including diet, exercise, and weight loss to help control her blood glucose levels.  ALLERGIES: Allergies  Allergen Reactions  . Lasix [Furosemide] Itching    Hypersensitivity   . Onion Other (See Comments)    sereve diarrhea, stomach pains (Crohn's flares)  . Oxycodone Other (See Comments)    Altered mental status  . Potassium-Containing Compounds Other (See Comments)    Due to chron's, difficult passing though kidneys not allowing absorption in the body   . Prednisone Other (See Comments)    Altered mood  . Sulfa Drugs Cross Reactors Other (See Comments)    "feels like bugs crawling on me"    MEDICATIONS: Current Outpatient Medications on File Prior to Visit  Medication Sig Dispense Refill  . aspirin EC 81 MG tablet Take 81 mg by mouth daily.    . B Complex-C (B-COMPLEX WITH VITAMIN C) tablet Take 1 tablet by mouth daily.    . Biotin 5000 MCG SUBL Place 5,000 mcg under the tongue daily.    . Cetirizine HCl (ZYRTEC ALLERGY) 10 MG CAPS TAKE 1 TABLET DAILY ALONG WITH THE LASIX 90 capsule 3  . clonazePAM (KLONOPIN)  0.5 MG tablet Take 0.125-0.25 mg by mouth daily as needed (for anxiety or sleep.).    [name] cyanocobalamin (,VITAMIN B-12,) 1000 MCG/ML injection Inject 1,000 mcg into the muscle every 30 (thirty) days. Pernicious anemia    . Cyanocobalamin (VITAMIN B-12) 5000 MCG SUBL Place 5,000 mcg under the tongue daily.    . cyclopentolate (CYCLODRYL,CYCLOGYL) 2 % ophthalmic solution Place 1 drop into both eyes 3 (three) times daily as needed (for uveitis or iritis).    . fluticasone (FLONASE) 50 MCG/ACT nasal spray as needed.     . Magnesium 250 MG TABS Take 250 mg by mouth every other day.    . Multiple Vitamins-Minerals (ICAPS AREDS 2 PO) Take 1 tablet by mouth every other day.     . Niacinamide-Zinc-Copper-FA (NICOTINAMIDE ZCF PO) Take 1 capsule by mouth daily. Nicotinamide Riboside-Pterostilbene 250-50 mg ([employer])    . OVER THE COUNTER MEDICATION Place 1 each under the tongue daily. 3-4 DROP DAILY (CBD OIL)    . Probiotic Product (PROBIOTIC PO) Take 1 capsule by mouth once a week.    . traMADol-acetaminophen (ULTRACET) 37.5-325 MG tablet Take 0.5-1 tablets by mouth every 6 (six) hours as needed (for pain.).     No current facility-administered medications on file prior to visit.     PAST MEDICAL HISTORY: Past Medical History:  Diagnosis Date  .  Abnormal nuclear stress test    a. 07/2017: cath with no CAD  . Amyloidosis ([place])   . Anemia   . Crohn disease ([place])   . Crohn's colitis ([place])   . ILD (interstitial lung disease) ([place])   . OSA on CPAP   . Pernicious anemia   . Polycythemia vera ([place])   . Sinus problem     PAST SURGICAL HISTORY: Past Surgical History:  Procedure Laterality Date  . COLON RESECTION    . HIP ARTHROPLASTY    . LEFT HEART CATH AND CORONARY ANGIOGRAPHY N/A 07/28/2017   Procedure: Left Heart Cath and Coronary Angiography;  Surgeon: Burnell Blanks, MD;  Location: [facility];  Service: Cardiovascular;  Laterality: N/A;    SOCIAL  HISTORY: Social History   Tobacco Use  . Smoking status: Former Smoker    Packs/day: 1.00    Years: 45.00    Pack years: 45.00    Types: Cigarettes    Last attempt to quit: 12/26/2008    Years since quitting: 9.7  . Smokeless tobacco: Never Used  Substance Use Topics  . Alcohol use: Yes    Comment: occ  . Drug use: No    FAMILY HISTORY: Family History  Problem Relation Age of Onset  . Glaucoma Mother   . High blood pressure Mother   . High Cholesterol Mother   . Sleep apnea Mother   . Diabetes Mellitus II Father   . Sleep apnea Father   . Anxiety disorder Father   . COPD Brother   . Heart disease Brother   . Non-Hodgkin's lymphoma Brother   . Diabetes Mellitus II Brother   . Glaucoma Brother     ROS: Review of Systems  Constitutional: Positive for weight loss.  Endo/Heme/Allergies:       Negative hypoglycemia    PHYSICAL EXAM: Blood pressure 124/72, pulse 62, temperature (!) 97.5 F (36.4 C), temperature source Oral, height _0  (1.6 m), weight 219 lb (99.3 kg), SpO2 95 %. Body mass index is 38.79 kg/m. Physical Exam  Constitutional: She is oriented to person, place, and time. She appears well-developed and well-nourished.  Cardiovascular: Normal rate.  Pulmonary/Chest: Effort normal.  Musculoskeletal: Normal range of motion.  Neurological: She is oriented to person, place, and time.  Skin: Skin is warm and dry.  Psychiatric: She has a normal mood and affect. Her behavior is normal.  Vitals reviewed.   RECENT LABS AND TESTS: BMET    Component Value Date/Time   NA 143 08/07/2018 1132   K 4.8 08/07/2018 1132   CL 106 08/07/2018 1132   CO2 23 08/07/2018 1132   GLUCOSE 95 08/07/2018 1132   GLUCOSE 119 (H) 07/29/2017 0553   BUN 27 08/07/2018 1132   CREATININE 0.84 08/07/2018 1132   CALCIUM 9.6 08/07/2018 1132   GFRNONAA 72 08/07/2018 1132   GFRAA 83 08/07/2018 1132   Lab Results  Component Value Date   HGBA1C 5.9 (H) 08/07/2018   HGBA1C 6.6 (H)  04/05/2018   Lab Results  Component Value Date   INSULIN 16.4 08/07/2018   INSULIN 30.6 (H) 04/05/2018   CBC    Component Value Date/Time   WBC 7.1 04/05/2018 1009   RBC 4.94 04/05/2018 1009   HGB 14.7 04/05/2018 1009   HCT 43.6 04/05/2018 1009   PLT 342 07/25/2017 1544   MCV 88 04/05/2018 1009   MCH 29.8 04/05/2018 1009   MCHC 33.7 04/05/2018 1009   RDW 15.3 04/05/2018 1009  LYMPHSABS 1.6 04/05/2018 1009   EOSABS 0.2 04/05/2018 1009   BASOSABS 0.0 04/05/2018 1009   Iron/TIBC/Ferritin/ %Sat    Component Value Date/Time   IRON 111 04/05/2018 1009   TIBC 311 04/05/2018 1009   FERRITIN 71 04/05/2018 1009   IRONPCTSAT 36 04/05/2018 1009   Lipid Panel     Component Value Date/Time   CHOL 165 08/07/2018 1132   TRIG 70 08/07/2018 1132   HDL 53 08/07/2018 1132   LDLCALC 98 08/07/2018 1132   Hepatic Function Panel     Component Value Date/Time   PROT 6.9 08/07/2018 1132   ALBUMIN 4.2 08/07/2018 1132   AST 17 08/07/2018 1132   ALT 20 08/07/2018 1132   ALKPHOS 83 08/07/2018 1132   BILITOT 0.6 08/07/2018 1132      Component Value Date/Time   TSH 0.821 04/05/2018 1009    ASSESSMENT AND PLAN: Type 2 diabetes mellitus without complication, without long-term current use of insulin (HCC)  Class 2 severe obesity with serious comorbidity and body mass index (BMI) of 38.0 to 38.9 in adult, unspecified obesity type (Patty [name])  PLAN:  Diabetes II Patty [name] has been given extensive diabetes education by myself today including ideal fasting and post-prandial blood glucose readings, individual ideal Hgb A1c goals and hypoglycemia prevention. We discussed the importance of good blood sugar control to decrease the likelihood of diabetic complications such as nephropathy, neuropathy, limb loss, blindness, coronary artery disease, and death. We discussed the importance of intensive lifestyle modification including diet, exercise and weight loss as the first line treatment for diabetes. Patty [name]  will continue diet and exercise, and she agrees to follow up with our clinic in 6 weeks and we will recheck labs at that time.  I spent > than 50% of the 15 minute visit on counseling as documented in the note.  Obesity Patty [name] is currently in the action stage of change. As such, her goal is to continue with weight loss efforts She has agreed to follow the Category 2 plan Patty [name] has been instructed to work up to a goal of 150 minutes of combined cardio and strengthening exercise per week for weight loss and overall health benefits. We discussed the following Behavioral Modification Strategies today: increasing lean protein intake, decreasing simple carbohydrates  and work on meal planning and easy cooking plans   Patty [name] has agreed to follow up with our clinic in 6 weeks. She was informed of the importance of frequent follow up visits to maximize her success with intensive lifestyle modifications for her multiple health conditions.   OBESITY BEHAVIORAL INTERVENTION VISIT  Today's visit was # 7   Starting weight: 251 lbs Starting date: 04/05/18 Today's weight : 219 lbs Today's date: 10/04/2018 Total lbs lost to date: 39    ASK: We discussed the diagnosis of obesity with Patty [name] today and Patty [name] agreed to give [name] permission to discuss obesity behavioral modification therapy today.  ASSESS: Patty [name] has the diagnosis of obesity and her BMI today is 38.8 Patty [name] is in the action stage of change   ADVISE: Patty [name] was educated on the multiple health risks of obesity as well as the benefit of weight loss to improve her health. She was advised of the need for long term treatment and the importance of lifestyle modifications to improve her current health and to decrease her risk of future health problems.  AGREE: Multiple dietary modification options and treatment options were discussed and  Patty [name] agreed to follow the recommendations documented in the above note.  ARRANGE: Patty [name] was  educated on the importance of frequent visits to treat obesity as outlined per CMS and USPSTF guidelines and agreed to schedule her next follow up appointment today.  I, Trixie Dredge, am acting as transcriptionist for Dennard Nip, MD  I have reviewed the above documentation for accuracy and completeness, and I agree with the above. -Dennard Nip, MD

## 2018-11-08 ENCOUNTER — Encounter (INDEPENDENT_AMBULATORY_CARE_PROVIDER_SITE_OTHER): Payer: Self-pay | Admitting: Family Medicine

## 2018-11-08 ENCOUNTER — Other Ambulatory Visit (INDEPENDENT_AMBULATORY_CARE_PROVIDER_SITE_OTHER): Payer: Self-pay | Admitting: Family Medicine

## 2018-11-08 ENCOUNTER — Ambulatory Visit (INDEPENDENT_AMBULATORY_CARE_PROVIDER_SITE_OTHER): Payer: Medicare Other | Admitting: Family Medicine

## 2018-11-08 VITALS — BP 102/69 | HR 76 | Temp 97.5°F | Ht 63.0 in | Wt 222.0 lb

## 2018-11-08 DIAGNOSIS — E119 Type 2 diabetes mellitus without complications: Secondary | ICD-10-CM | POA: Diagnosis not present

## 2018-11-08 DIAGNOSIS — Z6839 Body mass index (BMI) 39.0-39.9, adult: Secondary | ICD-10-CM | POA: Diagnosis not present

## 2018-11-08 MED ORDER — METFORMIN HCL 500 MG PO TABS: 500.0000 mg | ORAL_TABLET | Freq: Every day | ORAL | 0 refills | Status: DC

## 2018-11-12 NOTE — Progress Notes (Signed)
Office: [phone]  /  Fax: [phone]   HPI:   Chief Complaint: Patty Patty [name] is here to discuss her progress with her Patty treatment plan. She is on the Category 2 plan and is following her eating plan approximately 70 % of the time. She states she is exercising 0 minutes 0 times per week. Patty [name] notes increased cravings in the PM, primarily for simple carbohydrates. She feels her blood sugar is dropping low, so she needs to eat.  Her weight is 222 lb (100.7 kg) today and has gained 3 pounds since her last visit. She has lost 29 lbs since starting treatment with [name].  Diabetes II Patty [name] has a diagnosis of diabetes type II. Patty [name] didn't tolerate metformin previously as she feels it dropped her BGs low. She is not checking her BGs. She wonders if she can take 1/2 dose. Last A1c was 5.9. She has been working on intensive lifestyle modifications including diet, exercise, and weight loss to help control her blood glucose levels.  ALLERGIES: Allergies  Allergen Reactions  . Lasix [Furosemide] Itching    Hypersensitivity   . Onion Other (See Comments)    sereve diarrhea, stomach pains (Crohn's flares)  . Oxycodone Other (See Comments)    Altered mental status  . Potassium-Containing Compounds Other (See Comments)    Due to chron's, difficult passing though kidneys not allowing absorption in the body   . Prednisone Other (See Comments)    Altered mood  . Sulfa Drugs Cross Reactors Other (See Comments)    "feels like bugs crawling on me"    MEDICATIONS: Current Outpatient Medications on File Prior to Visit  Medication Sig Dispense Refill  . aspirin EC 81 MG tablet Take 81 mg by mouth daily.    . B Complex-C (B-COMPLEX WITH VITAMIN C) tablet Take 1 tablet by mouth daily.    . Biotin 5000 MCG SUBL Place 5,000 mcg under the tongue daily.    . Cetirizine HCl (ZYRTEC ALLERGY) 10 MG CAPS TAKE 1 TABLET DAILY ALONG WITH THE LASIX 90 capsule 3  . clonazePAM (KLONOPIN) 0.5 MG tablet Take  0.125-0.25 mg by mouth daily as needed (for anxiety or sleep.).    [name] cyanocobalamin (,VITAMIN B-12,) 1000 MCG/ML injection Inject 1,000 mcg into the muscle every 30 (thirty) days. Pernicious anemia    . Cyanocobalamin (VITAMIN B-12) 5000 MCG SUBL Place 5,000 mcg under the tongue daily.    . cyclopentolate (CYCLODRYL,CYCLOGYL) 2 % ophthalmic solution Place 1 drop into both eyes 3 (three) times daily as needed (for uveitis or iritis).    . fluticasone (FLONASE) 50 MCG/ACT nasal spray as needed.     . Magnesium 250 MG TABS Take 250 mg by mouth every other day.    . Multiple Vitamins-Minerals (ICAPS AREDS 2 PO) Take 1 tablet by mouth every other day.     . Niacinamide-Zinc-Copper-FA (NICOTINAMIDE ZCF PO) Take 1 capsule by mouth daily. Nicotinamide Riboside-Pterostilbene 250-50 mg ([employer])    . OVER THE COUNTER MEDICATION Place 1 each under the tongue daily. 3-4 DROP DAILY (CBD OIL)    . Probiotic Product (PROBIOTIC PO) Take 1 capsule by mouth once a week.    . traMADol-acetaminophen (ULTRACET) 37.5-325 MG tablet Take 0.5-1 tablets by mouth every 6 (six) hours as needed (for pain.).     No current facility-administered medications on file prior to visit.     PAST MEDICAL HISTORY: Past Medical History:  Diagnosis Date  . Abnormal nuclear stress test  a. 07/2017: cath with no CAD  . Amyloidosis ([place])   . Anemia   . Crohn disease ([place])   . Crohn's colitis ([place])   . ILD (interstitial lung disease) ([place])   . OSA on CPAP   . Pernicious anemia   . Polycythemia vera ([place])   . Sinus problem     PAST SURGICAL HISTORY: Past Surgical History:  Procedure Laterality Date  . COLON RESECTION    . HIP ARTHROPLASTY    . LEFT HEART CATH AND CORONARY ANGIOGRAPHY N/A 07/28/2017   Procedure: Left Heart Cath and Coronary Angiography;  Surgeon: Burnell Blanks, MD;  Location: [facility];  Service: Cardiovascular;  Laterality: N/A;    SOCIAL HISTORY: Social  History   Tobacco Use  . Smoking status: Former Smoker    Packs/day: 1.00    Years: 45.00    Pack years: 45.00    Types: Cigarettes    Last attempt to quit: 12/26/2008    Years since quitting: 9.8  . Smokeless tobacco: Never Used  Substance Use Topics  . Alcohol use: Yes    Comment: occ  . Drug use: No    FAMILY HISTORY: Family History  Problem Relation Age of Onset  . Glaucoma Mother   . High blood pressure Mother   . High Cholesterol Mother   . Sleep apnea Mother   . Diabetes Mellitus II Father   . Sleep apnea Father   . Anxiety disorder Father   . COPD Brother   . Heart disease Brother   . Non-Hodgkin's lymphoma Brother   . Diabetes Mellitus II Brother   . Glaucoma Brother     ROS: Review of Systems  Constitutional: Negative for weight loss.    PHYSICAL EXAM: Blood pressure 102/69, pulse 76, temperature (!) 97.5 F (36.4 C), temperature source Oral, height _0  (1.6 m), weight 222 lb (100.7 kg), SpO2 95 %. Body mass index is 39.33 kg/m. Physical Exam  Constitutional: She is oriented to person, place, and time. She appears well-developed and well-nourished.  Cardiovascular: Normal rate.  Pulmonary/Chest: Effort normal.  Musculoskeletal: Normal range of motion.  Neurological: She is oriented to person, place, and time.  Skin: Skin is warm and dry.  Psychiatric: She has a normal mood and affect. Her behavior is normal.  Vitals reviewed.   RECENT LABS AND TESTS: BMET    Component Value Date/Time   NA 143 08/07/2018 1132   K 4.8 08/07/2018 1132   CL 106 08/07/2018 1132   CO2 23 08/07/2018 1132   GLUCOSE 95 08/07/2018 1132   GLUCOSE 119 (H) 07/29/2017 0553   BUN 27 08/07/2018 1132   CREATININE 0.84 08/07/2018 1132   CALCIUM 9.6 08/07/2018 1132   GFRNONAA 72 08/07/2018 1132   GFRAA 83 08/07/2018 1132   Lab Results  Component Value Date   HGBA1C 5.9 (H) 08/07/2018   HGBA1C 6.6 (H) 04/05/2018   Lab Results  Component Value Date   INSULIN 16.4  08/07/2018   INSULIN 30.6 (H) 04/05/2018   CBC    Component Value Date/Time   WBC 7.1 04/05/2018 1009   RBC 4.94 04/05/2018 1009   HGB 14.7 04/05/2018 1009   HCT 43.6 04/05/2018 1009   PLT 342 07/25/2017 1544   MCV 88 04/05/2018 1009   MCH 29.8 04/05/2018 1009   MCHC 33.7 04/05/2018 1009   RDW 15.3 04/05/2018 1009   LYMPHSABS 1.6 04/05/2018 1009   EOSABS 0.2 04/05/2018 1009   BASOSABS 0.0 04/05/2018 1009  Iron/TIBC/Ferritin/ %Sat    Component Value Date/Time   IRON 111 04/05/2018 1009   TIBC 311 04/05/2018 1009   FERRITIN 71 04/05/2018 1009   IRONPCTSAT 36 04/05/2018 1009   Lipid Panel     Component Value Date/Time   CHOL 165 08/07/2018 1132   TRIG 70 08/07/2018 1132   HDL 53 08/07/2018 1132   LDLCALC 98 08/07/2018 1132   Hepatic Function Panel     Component Value Date/Time   PROT 6.9 08/07/2018 1132   ALBUMIN 4.2 08/07/2018 1132   AST 17 08/07/2018 1132   ALT 20 08/07/2018 1132   ALKPHOS 83 08/07/2018 1132   BILITOT 0.6 08/07/2018 1132      Component Value Date/Time   TSH 0.821 04/05/2018 1009    ASSESSMENT AND PLAN: Type 2 diabetes mellitus without complication, without long-term current use of insulin ([place]) - Plan: metFORMIN (GLUCOPHAGE) 500 MG tablet  Class 2 severe Patty with serious comorbidity and body mass index (BMI) of 39.0 to 39.9 in adult, unspecified Patty type ([place])  PLAN:  Diabetes II Patty [name] has been given extensive diabetes education by myself today including ideal fasting and post-prandial blood glucose readings, individual ideal Hgb A1c goals and hypoglycemia prevention. We discussed the importance of good blood sugar control to decrease the likelihood of diabetic complications such as nephropathy, neuropathy, limb loss, blindness, coronary artery disease, and death. We discussed the importance of intensive lifestyle modification including diet, exercise and weight loss as the first line treatment for diabetes. Patty [name] agrees to continue  taking metformin 500 mg q AM #30 and we will refill for 1 month (ok to take 1/2 pill daily for now); and she will continue diet and exercise. Patty [name] agrees to follow up with our clinic in 3 to 4 weeks.  Patty [name] is currently in the action stage of change. As such, her goal is to continue with weight loss efforts She has agreed to follow the Category 2 plan Patty [name] has been instructed to work up to a goal of 150 minutes of combined cardio and strengthening exercise per week for weight loss and overall health benefits. We discussed the following Behavioral Modification Strategies today: increasing lean protein intake and decreasing simple carbohydrates    Patty [name] has agreed to follow up with our clinic in 3 to 4 weeks. She was informed of the importance of frequent follow up visits to maximize her success with intensive lifestyle modifications for her multiple health conditions.   Patty BEHAVIORAL INTERVENTION VISIT  Today's visit was # 8   Starting weight: 251 lbs Starting date: 04/05/18 Today's weight : 222 lbs Today's date: 11/08/2018 Total lbs lost to date: 29 At least 15 minutes were spent on discussing the following behavioral intervention visit.   ASK: We discussed the diagnosis of Patty with Patty [name] today and Patty [name] agreed to give [name] permission to discuss Patty behavioral modification therapy today.  ASSESS: Patty [name] has the diagnosis of Patty and her BMI today is 39.34 Patty [name] is in the action stage of change   ADVISE: Patty [name] was educated on the multiple health risks of Patty as well as the benefit of weight loss to improve her health. She was advised of the need for long term treatment and the importance of lifestyle modifications to improve her current health and to decrease her risk of future health problems.  AGREE: Multiple dietary modification options and treatment options were discussed and  Patty [name] agreed to follow the recommendations documented in the above  note.  ARRANGE:  Patty [name] was educated on the importance of frequent visits to treat Patty as outlined per CMS and USPSTF guidelines and agreed to schedule her next follow up appointment today.  I, Trixie Dredge, am acting as transcriptionist for Dennard Nip, MD  I have reviewed the above documentation for accuracy and completeness, and I agree with the above. -Dennard Nip, MD

## 2018-12-05 DIAGNOSIS — D649 Anemia, unspecified: Secondary | ICD-10-CM | POA: Diagnosis not present

## 2018-12-06 ENCOUNTER — Ambulatory Visit (INDEPENDENT_AMBULATORY_CARE_PROVIDER_SITE_OTHER): Payer: Medicare Other | Admitting: Family Medicine

## 2018-12-06 ENCOUNTER — Encounter (INDEPENDENT_AMBULATORY_CARE_PROVIDER_SITE_OTHER): Payer: Self-pay | Admitting: Family Medicine

## 2018-12-06 VITALS — BP 104/56 | HR 78 | Ht 63.0 in | Wt 221.0 lb

## 2018-12-06 DIAGNOSIS — F3289 Other specified depressive episodes: Secondary | ICD-10-CM

## 2018-12-06 DIAGNOSIS — E119 Type 2 diabetes mellitus without complications: Secondary | ICD-10-CM | POA: Diagnosis not present

## 2018-12-06 DIAGNOSIS — Z6839 Body mass index (BMI) 39.0-39.9, adult: Secondary | ICD-10-CM | POA: Diagnosis not present

## 2018-12-06 MED ORDER — TOPIRAMATE 50 MG PO TABS: 50.0000 mg | ORAL_TABLET | Freq: Every day | ORAL | 0 refills | Status: DC

## 2018-12-06 MED ORDER — METFORMIN HCL 500 MG PO TABS: 500.0000 mg | ORAL_TABLET | Freq: Every day | ORAL | 0 refills | Status: DC

## 2018-12-10 NOTE — Progress Notes (Signed)
Office: [phone]  /  Fax: [phone]   HPI:   Chief Complaint: OBESITY Patty [name] is here to discuss her progress with her obesity treatment plan. She is on the Category 2 plan and is following her eating plan approximately 80 % of the time. She states she is doing yard work 120 minutes 2 times per week. Patty [name] continues to do well on her Category 2 plan. She is doing well with meal planning and prepping. She is active even in December with yard work.  Her weight is 221 lb (100.2 kg) today and has had a weight loss of 1 pound over a period of 4 weeks since her last visit. She has lost 30 lbs since starting treatment with [name].  Depression with emotional eating behaviors Patty [name] is struggling with emotional eating and using food for comfort to the extent that it is negatively impacting her health. She often snacks when she is not hungry. Patty [name] sometimes feels she is out of control and then feels guilty that she made poor food choices. She has been working on behavior modification techniques to help reduce her emotional eating and has been somewhat successful. Patty [name] notes an increase in evening snacking and it is sometimes related to her decreased mood. She has been on Prozac in the past and she shows no sign of suicidal or homicidal ideations.  Diabetes II Patty [name] has a diagnosis of diabetes type II. Patty [name] is on metformin 1/2 tablet PO qd, as she had mild GI upset with the full dose. She is doing well with that prescription. Her last A1c was 5.9 on 08/07/18.She has been working on intensive lifestyle modifications including diet, exercise, and weight loss to help control her blood glucose levels.  ALLERGIES: Allergies  Allergen Reactions  . Lasix [Furosemide] Itching    Hypersensitivity   . Onion Other (See Comments)    sereve diarrhea, stomach pains (Crohn's flares)  . Oxycodone Other (See Comments)    Altered mental status  . Potassium-Containing Compounds Other (See Comments)    Due to  chron's, difficult passing though kidneys not allowing absorption in the body   . Prednisone Other (See Comments)    Altered mood  . Sulfa Drugs Cross Reactors Other (See Comments)    "feels like bugs crawling on me"    MEDICATIONS: Current Outpatient Medications on File Prior to Visit  Medication Sig Dispense Refill  . aspirin EC 81 MG tablet Take 81 mg by mouth daily.    . B Complex-C (B-COMPLEX WITH VITAMIN C) tablet Take 1 tablet by mouth daily.    . Biotin 5000 MCG SUBL Place 5,000 mcg under the tongue daily.    . Cetirizine HCl (ZYRTEC ALLERGY) 10 MG CAPS TAKE 1 TABLET DAILY ALONG WITH THE LASIX 90 capsule 3  . clonazePAM (KLONOPIN) 0.5 MG tablet Take 0.125-0.25 mg by mouth daily as needed (for anxiety or sleep.).    [name] cyanocobalamin (,VITAMIN B-12,) 1000 MCG/ML injection Inject 1,000 mcg into the muscle every 30 (thirty) days. Pernicious anemia    . Cyanocobalamin (VITAMIN B-12) 5000 MCG SUBL Place 5,000 mcg under the tongue daily.    . cyclopentolate (CYCLODRYL,CYCLOGYL) 2 % ophthalmic solution Place 1 drop into both eyes 3 (three) times daily as needed (for uveitis or iritis).    . fluticasone (FLONASE) 50 MCG/ACT nasal spray as needed.     . Magnesium 250 MG TABS Take 250 mg by mouth every other day.    . Multiple Vitamins-Minerals (ICAPS AREDS 2 PO) Take  1 tablet by mouth every other day.     . Niacinamide-Zinc-Copper-FA (NICOTINAMIDE ZCF PO) Take 1 capsule by mouth daily. Nicotinamide Riboside-Pterostilbene 250-50 mg ([employer])    . OVER THE COUNTER MEDICATION Place 1 each under the tongue daily. 3-4 DROP DAILY (CBD OIL)    . Probiotic Product (PROBIOTIC PO) Take 1 capsule by mouth once a week.    . traMADol-acetaminophen (ULTRACET) 37.5-325 MG tablet Take 0.5-1 tablets by mouth every 6 (six) hours as needed (for pain.).     No current facility-administered medications on file prior to visit.     PAST MEDICAL HISTORY: Past Medical History:    Diagnosis Date  . Abnormal nuclear stress test    a. 07/2017: cath with no CAD  . Amyloidosis ([place])   . Anemia   . Crohn disease ([place])   . Crohn's colitis ([place])   . ILD (interstitial lung disease) ([place])   . OSA on CPAP   . Pernicious anemia   . Polycythemia vera ([place])   . Sinus problem     PAST SURGICAL HISTORY: Past Surgical History:  Procedure Laterality Date  . COLON RESECTION    . HIP ARTHROPLASTY    . LEFT HEART CATH AND CORONARY ANGIOGRAPHY N/A 07/28/2017   Procedure: Left Heart Cath and Coronary Angiography;  Surgeon: Burnell Blanks, MD;  Location: [facility];  Service: Cardiovascular;  Laterality: N/A;    SOCIAL HISTORY: Social History   Tobacco Use  . Smoking status: Former Smoker    Packs/day: 1.00    Years: 45.00    Pack years: 45.00    Types: Cigarettes    Last attempt to quit: 12/26/2008    Years since quitting: 9.9  . Smokeless tobacco: Never Used  Substance Use Topics  . Alcohol use: Yes    Comment: occ  . Drug use: No    FAMILY HISTORY: Family History  Problem Relation Age of Onset  . Glaucoma Mother   . High blood pressure Mother   . High Cholesterol Mother   . Sleep apnea Mother   . Diabetes Mellitus II Father   . Sleep apnea Father   . Anxiety disorder Father   . COPD Brother   . Heart disease Brother   . Non-Hodgkin's lymphoma Brother   . Diabetes Mellitus II Brother   . Glaucoma Brother     ROS: Review of Systems  Constitutional: Positive for weight loss.  Psychiatric/Behavioral: Positive for depression. Negative for suicidal ideas.       Negative for homicidal ideations.    PHYSICAL EXAM: Blood pressure (!) 104/56, pulse 78, height _0  (1.6 m), weight 221 lb (100.2 kg), SpO2 99 %. Body mass index is 39.15 kg/m. Physical Exam Vitals signs reviewed.  Constitutional:      Appearance: Normal appearance. She is obese.  Cardiovascular:     Rate and Rhythm: Normal rate.  Pulmonary:     Effort: Pulmonary effort  is normal.  Musculoskeletal: Normal range of motion.  Skin:    General: Skin is warm and dry.  Neurological:     Mental Status: She is alert and oriented to person, place, and time.  Psychiatric:        Mood and Affect: Mood normal.        Behavior: Behavior normal.     RECENT LABS AND TESTS: BMET    Component Value Date/Time   NA 143 08/07/2018 1132   K 4.8 08/07/2018 1132   CL  106 08/07/2018 1132   CO2 23 08/07/2018 1132   GLUCOSE 95 08/07/2018 1132   GLUCOSE 119 (H) 07/29/2017 0553   BUN 27 08/07/2018 1132   CREATININE 0.84 08/07/2018 1132   CALCIUM 9.6 08/07/2018 1132   GFRNONAA 72 08/07/2018 1132   GFRAA 83 08/07/2018 1132   Lab Results  Component Value Date   HGBA1C 5.9 (H) 08/07/2018   HGBA1C 6.6 (H) 04/05/2018   Lab Results  Component Value Date   INSULIN 16.4 08/07/2018   INSULIN 30.6 (H) 04/05/2018   CBC    Component Value Date/Time   WBC 7.1 04/05/2018 1009   RBC 4.94 04/05/2018 1009   HGB 14.7 04/05/2018 1009   HCT 43.6 04/05/2018 1009   PLT 342 07/25/2017 1544   MCV 88 04/05/2018 1009   MCH 29.8 04/05/2018 1009   MCHC 33.7 04/05/2018 1009   RDW 15.3 04/05/2018 1009   LYMPHSABS 1.6 04/05/2018 1009   EOSABS 0.2 04/05/2018 1009   BASOSABS 0.0 04/05/2018 1009   Iron/TIBC/Ferritin/ %Sat    Component Value Date/Time   IRON 111 04/05/2018 1009   TIBC 311 04/05/2018 1009   FERRITIN 71 04/05/2018 1009   IRONPCTSAT 36 04/05/2018 1009   Lipid Panel     Component Value Date/Time   CHOL 165 08/07/2018 1132   TRIG 70 08/07/2018 1132   HDL 53 08/07/2018 1132   LDLCALC 98 08/07/2018 1132   Hepatic Function Panel     Component Value Date/Time   PROT 6.9 08/07/2018 1132   ALBUMIN 4.2 08/07/2018 1132   AST 17 08/07/2018 1132   ALT 20 08/07/2018 1132   ALKPHOS 83 08/07/2018 1132   BILITOT 0.6 08/07/2018 1132      Component Value Date/Time   TSH 0.821 04/05/2018 1009   Results for ABBEGAYLE, DENAULT (MRN [id number]) as of 12/10/2018 08:43   Ref. Range 08/07/2018 11:32  Vitamin D, 25-Hydroxy Latest Ref Range: 30.0 - 100.0 ng/mL 43.5   ASSESSMENT AND PLAN: Type 2 diabetes mellitus without complication, without long-term current use of insulin (HCC) - Plan: metFORMIN (GLUCOPHAGE) 500 MG tablet  Other depression - with emotional eating - Plan: topiramate (TOPAMAX) 50 MG tablet  Class 2 severe obesity with serious comorbidity and body mass index (BMI) of 39.0 to 39.9 in adult, unspecified obesity type ([place])  PLAN:  Diabetes II Patty [name] has been given extensive diabetes education by myself today including ideal fasting and post-prandial blood glucose readings, individual ideal Hgb A1c goals  and hypoglycemia prevention. We discussed the importance of good blood sugar control to decrease the likelihood of diabetic complications such as nephropathy, neuropathy, limb loss, blindness, coronary artery disease, and death. We discussed the importance of intensive lifestyle modification including diet, exercise and weight loss as the first line treatment for diabetes. Patty [name] agrees to continue her metformin 543m, 1/2 tablet PO with breakfast #30 with no refills and will follow up at the agreed upon time in 3 to 4 weeks.  Depression with Emotional Eating Behaviors We discussed behavior modification techniques today to help Patty [name] with her emotional eating and depression. She has agreed to start Topamax [id number] #30 with no refills and agreed to follow up as directed.  Obesity Patty [name] currently in the action stage of change. As such, her goal is to continue with weight loss efforts. She has agreed to follow the Category 2 plan. Patty [name] been instructed to work up to a goal of 150 minutes of combined cardio and strengthening exercise per week for  weight loss and overall health benefits. We discussed the following Behavioral Modification Strategies today: increasing lean protein intake, decreasing simple carbohydrates, work on meal planning and  easy cooking plans, holiday eating strategies, celebration eating strategies, and emotional eating strategies.  Patty [name] has agreed to follow up with our clinic in 3 to 4 weeks. She was informed of the importance of frequent follow up visits to maximize her success with intensive lifestyle modifications for her multiple health conditions.   OBESITY BEHAVIORAL INTERVENTION VISIT  Today's visit was # 9   Starting weight: 251 lbs Starting date: 04/05/18 Today's weight : Weight: 221 lb (100.2 kg)  Today's date: 12/06/2018 Total lbs lost to date: 30 At least 15 minutes were spent on discussing the following behavioral intervention visit.  ASK: We discussed the diagnosis of obesity with [name] today and Arleen agreed to give [name] permission to discuss obesity behavioral modification therapy today.  ASSESS: [place]al[place] has the diagnosis of obesity and her BMI today is 39.1. Tianni is in the action stage of change.   ADVISE: Sharonna was educated on the multiple health risks of obesity as well as the benefit of weight loss to improve her health. She was advised of the need for long term treatment and the importance of lifestyle modifications to improve her current health and to decrease her risk of future health problems.  AGREE: Multiple dietary modification options and treatment options were discussed and Pressley agreed to follow the recommendations documented in the above note.  ARRANGE: Cordie was educated on the importance of frequent visits to treat obesity as outlined per CMS and USPSTF guidelines and agreed to schedule her next follow up appointment today.  I, Marcille Blanco, am acting as transcriptionist for Starlyn Skeans, MD  I have reviewed the above documentation for accuracy and completeness, and I agree with the above. -Dennard Nip, MD

## 2019-01-07 ENCOUNTER — Ambulatory Visit (INDEPENDENT_AMBULATORY_CARE_PROVIDER_SITE_OTHER): Payer: Medicare Other | Admitting: Family Medicine

## 2019-01-07 ENCOUNTER — Encounter (INDEPENDENT_AMBULATORY_CARE_PROVIDER_SITE_OTHER): Payer: Self-pay | Admitting: Family Medicine

## 2019-01-07 VITALS — BP 96/65 | HR 80 | Temp 97.7°F | Ht 63.0 in | Wt 227.0 lb

## 2019-01-07 DIAGNOSIS — E7849 Other hyperlipidemia: Secondary | ICD-10-CM | POA: Diagnosis not present

## 2019-01-07 DIAGNOSIS — R5383 Other fatigue: Secondary | ICD-10-CM | POA: Diagnosis not present

## 2019-01-07 DIAGNOSIS — E119 Type 2 diabetes mellitus without complications: Secondary | ICD-10-CM | POA: Diagnosis not present

## 2019-01-07 DIAGNOSIS — F3289 Other specified depressive episodes: Secondary | ICD-10-CM | POA: Diagnosis not present

## 2019-01-07 DIAGNOSIS — E538 Deficiency of other specified B group vitamins: Secondary | ICD-10-CM

## 2019-01-07 DIAGNOSIS — Z6841 Body Mass Index (BMI) 40.0 and over, adult: Secondary | ICD-10-CM

## 2019-01-07 DIAGNOSIS — E559 Vitamin D deficiency, unspecified: Secondary | ICD-10-CM | POA: Diagnosis not present

## 2019-01-07 MED ORDER — TOPIRAMATE 50 MG PO TABS: 50.0000 mg | ORAL_TABLET | Freq: Every day | ORAL | 0 refills | Status: DC

## 2019-01-07 MED ORDER — METFORMIN HCL 500 MG PO TABS: 500.0000 mg | ORAL_TABLET | Freq: Every day | ORAL | 0 refills | Status: DC

## 2019-01-08 NOTE — Progress Notes (Signed)
Office: [phone]  /  Fax: [phone]   HPI:   Chief Complaint: OBESITY Harry is here to discuss her progress with her obesity treatment plan. She is on the Category 2 plan and is following her eating plan approximately 70 % of the time. She states she is exercising 0 minutes 0 times per week. Marlina overindulged over the holidays and has gained 6 pounds, but states that she is ready to get back on track with her Category 2 plan. She agrees to work on meal prep especially. She feels her weight gain may be due to Cushing's syndrome which she read about over the holidays.  Her weight is 227 lb (103 kg) today and has had a weight gain of 6 pounds over a period of 4 weeks since her last visit. She has lost 24 lbs since starting treatment with [name].  Fatigue Chizara feels her energy is lower than it should be. This has worsened with weight gain and has not worsened recently. Enya has cushingoid features, such as a buffalo hump, fatigue, and stretch marks. She has researched this and requests a cortisol level be drawn.  Diabetes II Eddie has a diagnosis of diabetes type II. Faiga does not check her blood sugars. She is stable on metformin and diet and denies any nausea, vomiting, and hypoglycemic episodes. Last A1c was 5.9 on 08/07/18. She has been working on intensive lifestyle modifications including diet, exercise, and weight loss to help control her blood glucose levels.  Depression with emotional eating behaviors Yaneliz's mood is stable on topiramate. She is struggling with emotional eating and using food for comfort to the extent that it is negatively impacting her health. She often snacks when she is not hungry. Jayce sometimes feels she is out of control and then feels guilty that she made poor food choices. She has been working on behavior modification techniques to help reduce her emotional eating and has been somewhat successful. She denies dysgeusia or foggy headedness.  Vitamin  B12 Waneda has a diagnosis of B12 insufficiency. This is not a new diagnosis. Elvia is on B12 injections and still notes fatigue. Seri does have a previous diagnosis of pernicious anemia. She does not have a history of weight loss surgery.  Vitamin D deficiency Hannan has a diagnosis of vitamin D deficiency. She is currently stable on vit D, but is not yet at goal. She is due for labs today.  Hyperlipidemia Lacie has hyperlipidemia and has been attempting to improve her cholesterol levels with intensive lifestyle modification including a low saturated fat diet, exercise and weight loss. She is not on a statin and denies any chest pain.  ASSESSMENT AND PLAN:  Other fatigue - Plan: CBC With Differential, T3, T4, free, TSH, Cortisol, urine, free  Type 2 diabetes mellitus without complication, without long-term current use of insulin (HCC) - Plan: Comprehensive metabolic panel, Insulin, random, Hemoglobin A1c, Microalbumin / creatinine urine ratio, metFORMIN (GLUCOPHAGE) 500 MG tablet  B12 nutritional deficiency - Plan: Vitamin B12, Folate  Vitamin D deficiency - Plan: VITAMIN D 25 Hydroxy (Vit-D Deficiency, Fractures)  Other hyperlipidemia - Plan: Lipid Panel With LDL/HDL Ratio  Other depression - with emotional eating - Plan: topiramate (TOPAMAX) 50 MG tablet  Class 3 severe obesity with serious comorbidity and body mass index (BMI) of 40.0 to 44.9 in adult, unspecified obesity type (HCC)  PLAN:  Fatigue Cynai was informed that her fatigue may be related to obesity, depression or many other causes. Labs will be ordered, and  in the meanwhile Shemia has agreed to work on diet, exercise and weight loss to help with fatigue. We will order a 24 hour urine free cortisol today and she agrees to follow up in 2 to 3 weeks.  Diabetes II Francille has been given extensive diabetes education by myself today including ideal fasting and post-prandial blood glucose readings, individual ideal Hgb A1c goals,  and hypoglycemia prevention. We discussed the importance of good blood sugar control to decrease the likelihood of diabetic complications such as nephropathy, neuropathy, limb loss, blindness, coronary artery disease, and death. We discussed the importance of intensive lifestyle modification including diet, exercise and weight loss as the first line treatment for diabetes. Florena agrees to continue her metformin 528m with breakfast #30 with no refills and will follow up at the agreed upon time.  Vitamin B12 SDutchesswill work on increasing B12 rich foods in her diet and continue her B12. We will plan on rechecking labs today. We will see her in 2 weeks and she agrees with this plan.  Vitamin D Deficiency SAubreewas informed that low vitamin D levels contributes to fatigue and are associated with obesity, breast, and colon cancer. She agrees to continue to take Vitamin  D and we will order labs today. She will follow up as directed.  Hyperlipidemia SEurydicewas informed of the American Heart Association Guidelines emphasizing intensive lifestyle modifications as the first line treatment for hyperlipidemia. We discussed many lifestyle modifications today in depth, and SKhamilawill continue to work on decreasing saturated fats such as fatty red meat, butter and many fried foods. She will also increase vegetables and lean protein in her diet and continue to work on exercise and weight loss efforts. We will order labs today and SAnnajuliaagrees to continue with her diet and to follow up at the agree upon time.  Depression with Emotional Eating Behaviors We discussed behavior modification techniques today to help SJaylianideal with her emotional eating and depression. She has agreed to take topiramate 589mqd #30 with no refills and agreed to follow up as directed.  Obesity SuMariavictorias currently in the action stage of change. As such, her goal is to continue with weight loss efforts. She has agreed to follow the Category 2  plan. SuJeanniaas been instructed to work up to a goal of 150 minutes of combined cardio and strengthening exercise per week for weight loss and overall health benefits. We discussed the following Behavioral Modification Strategies today: increasing lean protein intake, decreasing simple carbohydrates, and work on meal planning and easy cooking plans.  SuLaizaas agreed to follow up with our clinic in 2 to 3 weeks. She was informed of the importance of frequent follow up visits to maximize her success with intensive lifestyle modifications for her multiple health conditions.  ALLERGIES: Allergies  Allergen Reactions  . Lasix [Furosemide] Itching    Hypersensitivity   . Onion Other (See Comments)    sereve diarrhea, stomach pains (Crohn's flares)  . Oxycodone Other (See Comments)    Altered mental status  . Potassium-Containing Compounds Other (See Comments)    Due to chron's, difficult passing though kidneys not allowing absorption in the body   . Prednisone Other (See Comments)    Altered mood  . Sulfa Drugs Cross Reactors Other (See Comments)    "feels like bugs crawling on me"    MEDICATIONS: Current Outpatient Medications on File Prior to Visit  Medication Sig Dispense Refill  . aspirin EC 81 MG tablet  Take 81 mg by mouth daily.    . B Complex-C (B-COMPLEX WITH VITAMIN C) tablet Take 1 tablet by mouth daily.    . Biotin 5000 MCG SUBL Place 5,000 mcg under the tongue daily.    . Cetirizine HCl (ZYRTEC ALLERGY) 10 MG CAPS TAKE 1 TABLET DAILY ALONG WITH THE LASIX 90 capsule 3  . clonazePAM (KLONOPIN) 0.5 MG tablet Take 0.125-0.25 mg by mouth daily as needed (for anxiety or sleep.).    [name] cyanocobalamin (,VITAMIN B-12,) 1000 MCG/ML injection Inject 1,000 mcg into the muscle every 30 (thirty) days. Pernicious anemia    . Cyanocobalamin (VITAMIN B-12) 5000 MCG SUBL Place 5,000 mcg under the tongue daily.    . cyclopentolate (CYCLODRYL,CYCLOGYL) 2 % ophthalmic solution Place 1 drop into  both eyes 3 (three) times daily as needed (for uveitis or iritis).    . fluticasone (FLONASE) 50 MCG/ACT nasal spray as needed.     . Magnesium 250 MG TABS Take 250 mg by mouth every other day.    . Multiple Vitamins-Minerals (ICAPS AREDS 2 PO) Take 1 tablet by mouth every other day.     . Niacinamide-Zinc-Copper-FA (NICOTINAMIDE ZCF PO) Take 1 capsule by mouth daily. Nicotinamide Riboside-Pterostilbene 250-50 mg ([employer])    . OVER THE COUNTER MEDICATION Place 1 each under the tongue daily. 3-4 DROP DAILY (CBD OIL)    . Probiotic Product (PROBIOTIC PO) Take 1 capsule by mouth once a week.    . traMADol-acetaminophen (ULTRACET) 37.5-325 MG tablet Take 0.5-1 tablets by mouth every 6 (six) hours as needed (for pain.).     No current facility-administered medications on file prior to visit.     PAST MEDICAL HISTORY: Past Medical History:  Diagnosis Date  . Abnormal nuclear stress test    a. 07/2017: cath with no CAD  . Amyloidosis ([place])   . Anemia   . Crohn disease ([place])   . Crohn's colitis ([place])   . ILD (interstitial lung disease) ([place])   . OSA on CPAP   . Pernicious anemia   . Polycythemia vera ([place])   . Sinus problem     PAST SURGICAL HISTORY: Past Surgical History:  Procedure Laterality Date  . COLON RESECTION    . HIP ARTHROPLASTY    . LEFT HEART CATH AND CORONARY ANGIOGRAPHY N/A 07/28/2017   Procedure: Left Heart Cath and Coronary Angiography;  Surgeon: Burnell Blanks, MD;  Location: [facility];  Service: Cardiovascular;  Laterality: N/A;    SOCIAL HISTORY: Social History   Tobacco Use  . Smoking status: Former Smoker    Packs/day: 1.00    Years: 45.00    Pack years: 45.00    Types: Cigarettes    Last attempt to quit: 12/26/2008    Years since quitting: 10.0  . Smokeless tobacco: Never Used  Substance Use Topics  . Alcohol use: Yes    Comment: occ  . Drug use: No    FAMILY HISTORY: Family History  Problem Relation  Age of Onset  . Glaucoma Mother   . High blood pressure Mother   . High Cholesterol Mother   . Sleep apnea Mother   . Diabetes Mellitus II Father   . Sleep apnea Father   . Anxiety disorder Father   . COPD Brother   . Heart disease Brother   . Non-Hodgkin's lymphoma Brother   . Diabetes Mellitus II Brother   . Glaucoma Brother     ROS: Review of Systems  Constitutional: Positive for malaise/fatigue. Negative for weight loss.  HENT:       Negative for dysgeusia.  Cardiovascular: Negative for chest pain.  Gastrointestinal: Negative for nausea and vomiting.  Neurological:       Negative for foggy headedness.  Endo/Heme/Allergies:       Negative for hypoglycemia.  Psychiatric/Behavioral: Positive for depression.    PHYSICAL EXAM: Blood pressure 96/65, pulse 80, temperature 97.7 F (36.5 C), temperature source Oral, height _0  (1.6 m), weight 227 lb (103 kg), SpO2 96 %. Body mass index is 40.21 kg/m. Physical Exam Vitals signs reviewed.  Constitutional:      Appearance: Normal appearance. She is obese.  Cardiovascular:     Rate and Rhythm: Normal rate.  Pulmonary:     Effort: Pulmonary effort is normal.  Musculoskeletal: Normal range of motion.  Skin:    General: Skin is warm and dry.  Neurological:     Mental Status: She is alert and oriented to person, place, and time.  Psychiatric:        Mood and Affect: Mood normal.        Behavior: Behavior normal.     RECENT LABS AND TESTS: BMET    Component Value Date/Time   NA 143 08/07/2018 1132   K 4.8 08/07/2018 1132   CL 106 08/07/2018 1132   CO2 23 08/07/2018 1132   GLUCOSE 95 08/07/2018 1132   GLUCOSE 119 (H) 07/29/2017 0553   BUN 27 08/07/2018 1132   CREATININE 0.84 08/07/2018 1132   CALCIUM 9.6 08/07/2018 1132   GFRNONAA 72 08/07/2018 1132   GFRAA 83 08/07/2018 1132   Lab Results  Component Value Date   HGBA1C 5.9 (H) 08/07/2018   HGBA1C 6.6 (H) 04/05/2018   Lab Results  Component Value Date    INSULIN 16.4 08/07/2018   INSULIN 30.6 (H) 04/05/2018   CBC    Component Value Date/Time   WBC 7.1 04/05/2018 1009   RBC 4.94 04/05/2018 1009   HGB 14.7 04/05/2018 1009   HCT 43.6 04/05/2018 1009   PLT 342 07/25/2017 1544   MCV 88 04/05/2018 1009   MCH 29.8 04/05/2018 1009   MCHC 33.7 04/05/2018 1009   RDW 15.3 04/05/2018 1009   LYMPHSABS 1.6 04/05/2018 1009   EOSABS 0.2 04/05/2018 1009   BASOSABS 0.0 04/05/2018 1009   Iron/TIBC/Ferritin/ %Sat    Component Value Date/Time   IRON 111 04/05/2018 1009   TIBC 311 04/05/2018 1009   FERRITIN 71 04/05/2018 1009   IRONPCTSAT 36 04/05/2018 1009   Lipid Panel     Component Value Date/Time   CHOL 165 08/07/2018 1132   TRIG 70 08/07/2018 1132   HDL 53 08/07/2018 1132   LDLCALC 98 08/07/2018 1132   Hepatic Function Panel     Component Value Date/Time   PROT 6.9 08/07/2018 1132   ALBUMIN 4.2 08/07/2018 1132   AST 17 08/07/2018 1132   ALT 20 08/07/2018 1132   ALKPHOS 83 08/07/2018 1132   BILITOT 0.6 08/07/2018 1132      Component Value Date/Time   TSH 0.821 04/05/2018 1009   Results for MAIE, KESINGER (MRN [id number]) as of 01/08/2019 11:57  Ref. Range 08/07/2018 11:32  Vitamin D, 25-Hydroxy Latest Ref Range: 30.0 - 100.0 ng/mL 43.5     OBESITY BEHAVIORAL INTERVENTION VISIT  Today's visit was # 10   Starting weight: 251 lbs Starting date: 04/05/18 Today's weight : Weight: 227 lb (103 kg)  Today's date: 01/07/2019 Total lbs lost to date: 24  At least 15 minutes were spent on discussing the following behavioral intervention visit.  ASK: We discussed the diagnosis of obesity with [name] today and Khara agreed to give [name] permission to discuss obesity behavioral modification therapy today.  ASSESS: Karletta has the diagnosis of obesity and her BMI today is 40.2. Emalene is in the action stage of change.   ADVISE: Alicen was educated on the multiple health risks of obesity as well as the benefit of weight loss to  improve her health. She was advised of the need for long term treatment and the importance of lifestyle modifications to improve her current health and to decrease her risk of future health problems.  AGREE: Multiple dietary modification options and treatment options were discussed and Kelisha agreed to follow the recommendations documented in the above note.  ARRANGE: Danielly was educated on the importance of frequent visits to treat obesity as outlined per CMS and USPSTF guidelines and agreed to schedule her next follow up appointment today.  I, Marcille Blanco, am acting as transcriptionist for Starlyn Skeans, MD  I have reviewed the above documentation for accuracy and completeness, and I agree with the above. -Dennard Nip, MD

## 2019-01-21 ENCOUNTER — Encounter (INDEPENDENT_AMBULATORY_CARE_PROVIDER_SITE_OTHER): Payer: Self-pay | Admitting: Family Medicine

## 2019-01-21 ENCOUNTER — Ambulatory Visit (INDEPENDENT_AMBULATORY_CARE_PROVIDER_SITE_OTHER): Payer: Medicare Other | Admitting: Family Medicine

## 2019-01-21 VITALS — BP 112/73 | HR 76 | Temp 97.6°F | Ht 63.0 in | Wt 223.0 lb

## 2019-01-21 DIAGNOSIS — Z6839 Body mass index (BMI) 39.0-39.9, adult: Secondary | ICD-10-CM | POA: Diagnosis not present

## 2019-01-21 DIAGNOSIS — E119 Type 2 diabetes mellitus without complications: Secondary | ICD-10-CM

## 2019-01-21 DIAGNOSIS — R5383 Other fatigue: Secondary | ICD-10-CM | POA: Diagnosis not present

## 2019-01-21 DIAGNOSIS — E559 Vitamin D deficiency, unspecified: Secondary | ICD-10-CM

## 2019-01-21 DIAGNOSIS — E538 Deficiency of other specified B group vitamins: Secondary | ICD-10-CM | POA: Diagnosis not present

## 2019-01-21 NOTE — Progress Notes (Signed)
Office: [phone]  /  Fax: [phone]   HPI:   Chief Complaint: OBESITY Patty Patty [name] is here to discuss her progress with her obesity treatment plan. She is on the Category 2 plan and is following her eating plan approximately 85 to 90 % of the time. She states she is exercising 0 minutes 0 times per week. Patty Patty [name] has been doing better with weight loss on her Category 2 plan. She notes that her hunger is controlled. She has done better with meal planning, prepping, and decreasing eating out.  Her weight is 223 lb (101.2 kg) Patty [name] and has had a weight loss of 4 pounds over a period of 2 weeks since her last visit. She has lost 4 lbs since starting treatment with [name].  Fatigue Patty Patty [name] wonders if her fatigue is due to increased cortisol or adrenal fatigue as she has been doing a lot of internet "research". She requests these be tested. She feels her energy is lower than it should be.   Vitamin D deficiency Patty Patty [name] has a diagnosis of vitamin D deficiency. She is currently stable on vit D and is due for labs Patty [name]. She denies nausea, vomiting, or muscle weakness.  Vitamin B12 deficiency Patty Patty [name] has a diagnosis of B12 insufficiency and notes fatigue. She gets B12 shots from another provider. She is due for her next shot and would like for her B12 to be tested to see how low it gets between injections.  Diabetes II Patty Patty [name] has a diagnosis of diabetes type II. Patty Patty [name] has discontinued her metformin on her own. She is due for labs Patty [name]. Her last A1c was 5.9 on 08/07/18 and she is doing well on her diet. She has been working on intensive lifestyle modifications including diet, exercise, and weight loss to help control her blood glucose levels.  ASSESSMENT AND PLAN:  Other fatigue - Plan: CBC With Differential, Lipid Panel With LDL/HDL Ratio, T3, T4, free, TSH  Vitamin D deficiency - Plan: VITAMIN D 25 Hydroxy (Vit-D Deficiency, Fractures)  B12 nutritional deficiency - Plan: Vitamin B12  Type 2 diabetes  mellitus without complication, without long-term current use of insulin (HCC) - Plan: Comprehensive metabolic panel, Hemoglobin A1c, Insulin, random, Cortisol, urine, free  Class 2 severe obesity with serious comorbidity and body mass index (BMI) of 39.0 to 39.9 in adult, unspecified obesity type (HCC)  PLAN:  Fatigue Patty Patty [name] was informed that her fatigue may be related to obesity, depression or many other causes. Labs will be ordered, and in the meanwhile Patty Patty [name] has agreed to work on diet, exercise and weight loss to help with fatigue. Proper sleep hygiene was discussed including the need for 7-8 hours of quality sleep each night. Patty ordered a 24 hour urine cortisol and Patty will follow up with her in 4 weeks. Patty Patty [name] agrees with this plan.  Vitamin D Deficiency Patty Patty [name] was informed that low vitamin D levels contributes to fatigue and are associated with obesity, breast, and colon cancer. A vitamin D level will be obtained Patty [name] and she agrees to follow up as directed.  Vitamin D deficiency Patty Patty [name] will work on increasing B12 rich foods in her diet. Patty will plan on check her B12 Patty [name] and she agrees to follow up at the agreed upon time.  Diabetes II Patty Patty [name] has been given extensive diabetes education by myself Patty [name] including ideal fasting and post-prandial blood glucose readings, individual ideal Hgb A1c goals, and hypoglycemia prevention. Patty discussed the importance of good blood sugar control to decrease the likelihood of diabetic  complications such as nephropathy, neuropathy, limb loss, blindness, coronary artery disease, and death. Patty discussed the importance of intensive lifestyle modification including diet, exercise and weight loss as the first line treatment for diabetes. Labs will be obtained Patty [name]. Patty Patty [name] agrees to continue her diet prescription and will follow up at the agreed upon time.  Obesity Patty Patty [name] is currently in the action stage of change. As such, her goal is to continue with weight loss  efforts. She has agreed to follow the Category 2 plan. [place] has been instructed to work up to a goal of 150 minutes of combined cardio and strengthening exercise per week for weight loss and overall health benefits. Patty discussed the following Behavioral Modification Strategies Patty [name]: increasing lean protein intake, decreasing simple carbohydrates, and work on meal planning and easy cooking plans.  Patty Patty [name] has agreed to follow up with our clinic in 4 weeks. She was informed of the importance of frequent follow up visits to maximize her success with intensive lifestyle modifications for her multiple health conditions.  ALLERGIES: Allergies  Allergen Reactions  . Lasix [Furosemide] Itching    Hypersensitivity   . Onion Other (See Comments)    sereve diarrhea, stomach pains (Crohn's flares)  . Oxycodone Other (See Comments)    Altered mental status  . Potassium-Containing Compounds Other (See Comments)    Due to chron's, difficult passing though kidneys not allowing absorption in the body   . Prednisone Other (See Comments)    Altered mood  . Sulfa Drugs Cross Reactors Other (See Comments)    "feels like bugs crawling on me"    MEDICATIONS: Current Outpatient Medications on File Prior to Visit  Medication Sig Dispense Refill  . aspirin EC 81 MG tablet Take 81 mg by mouth daily.    . B Complex-C (B-COMPLEX WITH VITAMIN C) tablet Take 1 tablet by mouth daily.    . Biotin 5000 MCG SUBL Place 5,000 mcg under the tongue daily.    . Cetirizine HCl (ZYRTEC ALLERGY) 10 MG CAPS TAKE 1 TABLET DAILY ALONG WITH THE LASIX 90 capsule 3  . clonazePAM (KLONOPIN) 0.5 MG tablet Take 0.125-0.25 mg by mouth daily as needed (for anxiety or sleep.).    [name] cyanocobalamin (,VITAMIN B-12,) 1000 MCG/ML injection Inject 1,000 mcg into the muscle every 30 (thirty) days. Pernicious anemia    . Cyanocobalamin (VITAMIN B-12) 5000 MCG SUBL Place 5,000 mcg under the tongue daily.    . cyclopentolate (CYCLODRYL,CYCLOGYL)  2 % ophthalmic solution Place 1 drop into both eyes 3 (three) times daily as needed (for uveitis or iritis).    . fluticasone (FLONASE) 50 MCG/ACT nasal spray as needed.     . Magnesium 250 MG TABS Take 250 mg by mouth every other day.    . metFORMIN (GLUCOPHAGE) 500 MG tablet Take 1 tablet (500 mg total) by mouth daily with breakfast. 30 tablet 0  . Multiple Vitamins-Minerals (ICAPS AREDS 2 PO) Take 1 tablet by mouth every other day.     . Niacinamide-Zinc-Copper-FA (NICOTINAMIDE ZCF PO) Take 1 capsule by mouth daily. Nicotinamide Riboside-Pterostilbene 250-50 mg ([employer])    . OVER THE COUNTER MEDICATION Place 1 each under the tongue daily. 3-4 DROP DAILY (CBD OIL)    . Probiotic Product (PROBIOTIC PO) Take 1 capsule by mouth once a week.    . topiramate (TOPAMAX) 50 MG tablet Take 1 tablet (50 mg total) by mouth daily. 30 tablet 0  . traMADol-acetaminophen (ULTRACET) 37.5-325 MG tablet Take 0.5-1 tablets  by mouth every 6 (six) hours as needed (for pain.).     No current facility-administered medications on file prior to visit.     PAST MEDICAL HISTORY: Past Medical History:  Diagnosis Date  . Abnormal nuclear stress test    a. 07/2017: cath with no CAD  . Amyloidosis ([place])   . Anemia   . Crohn disease ([place])   . Crohn's colitis ([place])   . ILD (interstitial lung disease) ([place])   . OSA on CPAP   . Pernicious anemia   . Polycythemia vera ([place])   . Sinus problem     PAST SURGICAL HISTORY: Past Surgical History:  Procedure Laterality Date  . COLON RESECTION    . HIP ARTHROPLASTY    . LEFT HEART CATH AND CORONARY ANGIOGRAPHY N/A 07/28/2017   Procedure: Left Heart Cath and Coronary Angiography;  Surgeon: Burnell Blanks, MD;  Location: [facility];  Service: Cardiovascular;  Laterality: N/A;    SOCIAL HISTORY: Social History   Tobacco Use  . Smoking status: Former Smoker    Packs/day: 1.00    Years: 45.00    Pack years: 45.00    Types:  Cigarettes    Last attempt to quit: 12/26/2008    Years since quitting: 10.0  . Smokeless tobacco: Never Used  Substance Use Topics  . Alcohol use: Yes    Comment: occ  . Drug use: No    FAMILY HISTORY: Family History  Problem Relation Age of Onset  . Glaucoma Mother   . High blood pressure Mother   . High Cholesterol Mother   . Sleep apnea Mother   . Diabetes Mellitus II Father   . Sleep apnea Father   . Anxiety disorder Father   . COPD Brother   . Heart disease Brother   . Non-Hodgkin's lymphoma Brother   . Diabetes Mellitus II Brother   . Glaucoma Brother    ROS: Review of Systems  Constitutional: Positive for malaise/fatigue and weight loss.  Gastrointestinal: Negative for nausea and vomiting.  Musculoskeletal:       Negative for muscle weakness.   PHYSICAL EXAM: Blood pressure 112/73, pulse 76, temperature 97.6 F (36.4 C), temperature source Oral, height 5' 3" (1.6 m), weight 223 lb (101.2 kg), SpO2 98 %. Body mass index is 39.5 kg/m. Physical Exam Vitals signs reviewed.  Constitutional:      Appearance: Normal appearance. She is obese.  Cardiovascular:     Rate and Rhythm: Normal rate.  Pulmonary:     Effort: Pulmonary effort is normal.  Musculoskeletal: Normal range of motion.  Skin:    General: Skin is warm and dry.  Neurological:     Mental Status: She is alert and oriented to person, place, and time.  Psychiatric:        Mood and Affect: Mood normal.        Behavior: Behavior normal.    RECENT LABS AND TESTS: BMET    Component Value Date/Time   NA 143 08/07/2018 1132   K 4.8 08/07/2018 1132   CL 106 08/07/2018 1132   CO2 23 08/07/2018 1132   GLUCOSE 95 08/07/2018 1132   GLUCOSE 119 (H) 07/29/2017 0553   BUN 27 08/07/2018 1132   CREATININE 0.84 08/07/2018 1132   CALCIUM 9.6 08/07/2018 1132   GFRNONAA 72 08/07/2018 1132   GFRAA 83 08/07/2018 1132   Lab Results  Component Value Date   HGBA1C 5.9 (H) 08/07/2018   HGBA1C 6.6 (H)  04/05/2018  Lab Results  Component Value Date   INSULIN 16.4 08/07/2018   INSULIN 30.6 (H) 04/05/2018   CBC    Component Value Date/Time   WBC 7.1 04/05/2018 1009   RBC 4.94 04/05/2018 1009   HGB 14.7 04/05/2018 1009   HCT 43.6 04/05/2018 1009   PLT 342 07/25/2017 1544   MCV 88 04/05/2018 1009   MCH 29.8 04/05/2018 1009   MCHC 33.7 04/05/2018 1009   RDW 15.3 04/05/2018 1009   LYMPHSABS 1.6 04/05/2018 1009   EOSABS 0.2 04/05/2018 1009   BASOSABS 0.0 04/05/2018 1009   Iron/TIBC/Ferritin/ %Sat    Component Value Date/Time   IRON 111 04/05/2018 1009   TIBC 311 04/05/2018 1009   FERRITIN 71 04/05/2018 1009   IRONPCTSAT 36 04/05/2018 1009   Lipid Panel     Component Value Date/Time   CHOL 165 08/07/2018 1132   TRIG 70 08/07/2018 1132   HDL 53 08/07/2018 1132   LDLCALC 98 08/07/2018 1132   Hepatic Function Panel     Component Value Date/Time   PROT 6.9 08/07/2018 1132   ALBUMIN 4.2 08/07/2018 1132   AST 17 08/07/2018 1132   ALT 20 08/07/2018 1132   ALKPHOS 83 08/07/2018 1132   BILITOT 0.6 08/07/2018 1132      Component Value Date/Time   TSH 0.821 04/05/2018 1009   Results for MAGAN, WINNETT (MRN [id number]) as of 01/21/2019 14:34  Ref. Range 08/07/2018 11:32  Vitamin D, 25-Hydroxy Latest Ref Range: 30.0 - 100.0 ng/mL 43.5    OBESITY BEHAVIORAL INTERVENTION VISIT  Patty [name]'s visit was # 11   Starting weight: 251 lbs Starting date: 04/05/18 Patty [name]'s weight : Weight: 223 lb (101.2 kg)  Patty [name]'s date: 01/21/2019 Total lbs lost to date: 52  ASK: Patty Patty [name] and Patty Patty [name] agreed to give [name] permission to discuss obesity behavioral modification therapy Patty [name].  ASSESS: Daizha has the diagnosis of obesity and her BMI Patty [name] is 39.5. Ashya is in the action stage of change.   ADVISE: Kanitra was educated on the multiple health risks of obesity as well as the benefit of weight loss to improve her health. She was advised of  the need for long term treatment and the importance of lifestyle modifications to improve her current health and to decrease her risk of future health problems.  AGREE: Multiple dietary modification options and treatment options were discussed and Carlie agreed to follow the recommendations documented in the above note.  ARRANGE: Raejean was educated on the importance of frequent visits to treat obesity as outlined per CMS and USPSTF guidelines and agreed to schedule her next follow up appointment Patty [name].  I, Marcille Blanco, am acting as transcriptionist for Starlyn Skeans, MD  I have reviewed the above documentation for accuracy and completeness, and I agree with the above. -Dennard Nip, MD

## 2019-01-23 ENCOUNTER — Telehealth: Payer: Self-pay | Admitting: Internal Medicine

## 2019-01-23 NOTE — Telephone Encounter (Signed)
Call made to patient requesting to go ahead and scheduled her Chest CT that is needed in March. Informed patient they typically call you closer to schedule time but I would forward this to the [facility] pool so they can call patient to schedule for March. Nothing further is needed at this time.

## 2019-01-25 LAB — CBC WITH DIFFERENTIAL
Basophils Absolute: 0 10*3/uL (ref 0.0–0.2)
Basos: 1 %
EOS (ABSOLUTE): 0.1 10*3/uL (ref 0.0–0.4)
Eos: 2 %
Hematocrit: 44 % (ref 34.0–46.6)
Hemoglobin: 14.4 g/dL (ref 11.1–15.9)
Immature Grans (Abs): 0 10*3/uL (ref 0.0–0.1)
Immature Granulocytes: 0 %
Lymphocytes Absolute: 1.6 10*3/uL (ref 0.7–3.1)
Lymphs: 24 %
MCH: 29.6 pg (ref 26.6–33.0)
MCHC: 32.7 g/dL (ref 31.5–35.7)
MCV: 90 fL (ref 79–97)
Monocytes Absolute: 0.5 10*3/uL (ref 0.1–0.9)
Monocytes: 7 %
Neutrophils Absolute: 4.5 10*3/uL (ref 1.4–7.0)
Neutrophils: 66 %
RBC: 4.87 x10E6/uL (ref 3.77–5.28)
RDW: 14.4 % (ref 11.7–15.4)
WBC: 6.8 10*3/uL (ref 3.4–10.8)

## 2019-01-25 LAB — COMPREHENSIVE METABOLIC PANEL
ALT: 20 IU/L (ref 0–32)
AST: 12 IU/L (ref 0–40)
Albumin/Globulin Ratio: 1.5 (ref 1.2–2.2)
Albumin: 4.2 g/dL (ref 3.8–4.8)
Alkaline Phosphatase: 79 IU/L (ref 39–117)
BUN/Creatinine Ratio: 28 (ref 12–28)
BUN: 21 mg/dL (ref 8–27)
Bilirubin Total: 0.5 mg/dL (ref 0.0–1.2)
CO2: 23 mmol/L (ref 20–29)
Calcium: 9.4 mg/dL (ref 8.7–10.3)
Chloride: 103 mmol/L (ref 96–106)
Creatinine, Ser: 0.75 mg/dL (ref 0.57–1.00)
GFR calc Af Amer: 95 mL/min/{1.73_m2} (ref >59)
GFR calc non Af Amer: 82 mL/min/{1.73_m2} (ref >59)
Globulin, Total: 2.8 g/dL (ref 1.5–4.5)
Glucose: 101 mg/dL — ABNORMAL HIGH (ref 65–99)
Potassium: 4.4 mmol/L (ref 3.5–5.2)
Sodium: 141 mmol/L (ref 134–144)
Total Protein: 7 g/dL (ref 6.0–8.5)

## 2019-01-25 LAB — HEMOGLOBIN A1C
Est. average glucose Bld gHb Est-mCnc: 123 mg/dL
Hgb A1c MFr Bld: 5.9 % — ABNORMAL HIGH (ref 4.8–5.6)

## 2019-01-25 LAB — LIPID PANEL WITH LDL/HDL RATIO
Cholesterol, Total: 178 mg/dL (ref 100–199)
HDL: 61 mg/dL (ref >39)
LDL Calculated: 102 mg/dL — ABNORMAL HIGH (ref 0–99)
LDl/HDL Ratio: 1.7 ratio (ref 0.0–3.2)
Triglycerides: 74 mg/dL (ref 0–149)
VLDL Cholesterol Cal: 15 mg/dL (ref 5–40)

## 2019-01-25 LAB — VITAMIN D 25 HYDROXY (VIT D DEFICIENCY, FRACTURES): Vit D, 25-Hydroxy: 32.7 ng/mL (ref 30.0–100.0)

## 2019-01-25 LAB — CORTISOL, URINE, FREE
Cortisol (Ur), Free: 26 ug/24 hr (ref 6–42)
Cortisol,F,ug/L,U: 13 ug/L

## 2019-01-25 LAB — INSULIN, RANDOM: INSULIN: 23.6 u[IU]/mL (ref 2.6–24.9)

## 2019-01-25 LAB — T3: T3, Total: 129 ng/dL (ref 71–180)

## 2019-01-25 LAB — T4, FREE: Free T4: 1.2 ng/dL (ref 0.82–1.77)

## 2019-01-25 LAB — TSH: TSH: 0.445 u[IU]/mL — ABNORMAL LOW (ref 0.450–4.500)

## 2019-01-25 LAB — VITAMIN B12: Vitamin B-12: >2000 pg/mL — ABNORMAL HIGH (ref 232–1245)

## 2019-02-18 ENCOUNTER — Ambulatory Visit (INDEPENDENT_AMBULATORY_CARE_PROVIDER_SITE_OTHER): Payer: Medicare Other | Admitting: Family Medicine

## 2019-02-18 ENCOUNTER — Encounter (INDEPENDENT_AMBULATORY_CARE_PROVIDER_SITE_OTHER): Payer: Self-pay | Admitting: Family Medicine

## 2019-02-18 VITALS — BP 102/60 | HR 76 | Temp 97.9°F | Ht 63.0 in | Wt 224.0 lb

## 2019-02-18 DIAGNOSIS — F418 Other specified anxiety disorders: Secondary | ICD-10-CM | POA: Diagnosis not present

## 2019-02-18 DIAGNOSIS — R7303 Prediabetes: Secondary | ICD-10-CM | POA: Diagnosis not present

## 2019-02-18 DIAGNOSIS — Z6839 Body mass index (BMI) 39.0-39.9, adult: Secondary | ICD-10-CM | POA: Diagnosis not present

## 2019-02-18 MED ORDER — FLUOXETINE HCL 10 MG PO TABS: 10.0000 mg | ORAL_TABLET | Freq: Every day | ORAL | 0 refills | Status: DC

## 2019-02-18 NOTE — Progress Notes (Signed)
Office: [phone]  /  Fax: [phone]   HPI:   Chief Complaint: OBESITY Patty [name] is here to discuss her progress with her obesity treatment plan. She is on the Category 2 plan and is following her eating plan approximately 85 % of the time. She states she is at the gym for 45-60 minutes 3 times per week. Patty [name] has stalled in her weight loss and is frustrated that she is not losing anymore.  Her weight is 224 lb (101.6 kg) today and has gained 1 pound since her last visit. She has lost 27 lbs since starting treatment with [name].  Depression with Anxiety Patty [name] had been on Prozac years ago and had weaned herself off this on her own. She has restarted Prozac on her own at 10 mg daily approximately 2 weeks ago. She shows no sign of suicidal or homicidal ideations.  Pre-Diabetes Patty [name] has a diagnosis of pre-diabetes based on her elevated Hgb A1c and was informed this puts her at greater risk of developing diabetes. Patty [name] discontinued metformin as she felt it was causing too many BGs fluctuations. She conitnues to work on diet and exercise to decrease risk of diabetes. She denies nausea or hypoglycemia.  ASSESSMENT AND PLAN:  Prediabetes  Depression with anxiety - Plan: FLUoxetine (PROZAC) 10 MG tablet  Class 2 severe obesity with serious comorbidity and body mass index (BMI) of 39.0 to 39.9 in adult, unspecified obesity type ([place])  PLAN:  Depression with Anxiety We discussed behavior modification techniques today to help Patty [name] deal with her depression and anxiety. Patty [name] agrees to start Prozac 10 mg q AM #30 with no refills. Patty [name] agrees to follow up with our clinic in 2 weeks with Patty [name], our registered dietitian and in 4 weeks with myself.  Pre-Diabetes Patty [name] will continue to work on weight loss, diet, exercise, and decreasing simple carbohydrates in her diet to help decrease the risk of diabetes. We dicussed metformin including benefits and risks. She was informed that eating too many  simple carbohydrates or too many calories at one sitting increases the likelihood of GI side effects. Patty [name] agrees to discontinue metformin, and she agrees to follow up with our clinic in 2 weeks with Patty [name], our registered dietitian and in 4 weeks with myself as directed to monitor her progress.  Obesity Patty [name] is currently in the action stage of change. As such, her goal is to continue with weight loss efforts She has agreed to keep a food journal with 1400-1600 calories and 85 grams of protein daily Patty [name] has been instructed to work up to a goal of 150 minutes of combined cardio and strengthening exercise per week for weight loss and overall health benefits. We discussed the following Behavioral Modification Strategies today: increasing lean protein intake, decreasing simple carbohydrates, work on meal planning and easy cooking plans, and better snacking choices Patty [name] will follow up with Patty [name], our registered dietitian in 2 weeks to review journal and offer suggestions.  Thuy has agreed to follow up with our clinic in 2 weeks with Patty [name], our registered dietitian and in 4 weeks with myself. She was informed of the importance of frequent follow up visits to maximize her success with intensive lifestyle modifications for her multiple health conditions.  ALLERGIES: Allergies  Allergen Reactions  . Lasix [Furosemide] Itching    Hypersensitivity   . Onion Other (See Comments)    sereve diarrhea, stomach pains (Crohn's flares)  . Oxycodone Other (See Comments)    Altered mental status  . Potassium-Containing  Compounds Other (See Comments)    Due to chron's, difficult passing though kidneys not allowing absorption in the body   . Prednisone Other (See Comments)    Altered mood  . Sulfa Drugs Cross Reactors Other (See Comments)    "feels like bugs crawling on me"    MEDICATIONS: Current Outpatient Medications on File Prior to Visit  Medication Sig Dispense Refill  . aspirin EC  81 MG tablet Take 81 mg by mouth daily.    . B Complex-C (B-COMPLEX WITH VITAMIN C) tablet Take 1 tablet by mouth daily.    . Biotin 5000 MCG SUBL Place 5,000 mcg under the tongue daily.    . Cetirizine HCl (ZYRTEC ALLERGY) 10 MG CAPS TAKE 1 TABLET DAILY ALONG WITH THE LASIX 90 capsule 3  . clonazePAM (KLONOPIN) 0.5 MG tablet Take 0.125-0.25 mg by mouth daily as needed (for anxiety or sleep.).    [name] cyanocobalamin (,VITAMIN B-12,) 1000 MCG/ML injection Inject 1,000 mcg into the muscle every 30 (thirty) days. Pernicious anemia    . Cyanocobalamin (VITAMIN B-12) 5000 MCG SUBL Place 5,000 mcg under the tongue daily.    . cyclopentolate (CYCLODRYL,CYCLOGYL) 2 % ophthalmic solution Place 1 drop into both eyes 3 (three) times daily as needed (for uveitis or iritis).    . fluticasone (FLONASE) 50 MCG/ACT nasal spray as needed.     . Magnesium 250 MG TABS Take 250 mg by mouth every other day.    . Multiple Vitamins-Minerals (ICAPS AREDS 2 PO) Take 1 tablet by mouth every other day.     . Niacinamide-Zinc-Copper-FA (NICOTINAMIDE ZCF PO) Take 1 capsule by mouth daily. Nicotinamide Riboside-Pterostilbene 250-50 mg ([employer])    . OVER THE COUNTER MEDICATION Place 1 each under the tongue daily. 3-4 DROP DAILY (CBD OIL)    . Probiotic Product (PROBIOTIC PO) Take 1 capsule by mouth once a week.    . topiramate (TOPAMAX) 50 MG tablet Take 1 tablet (50 mg total) by mouth daily. 30 tablet 0  . traMADol-acetaminophen (ULTRACET) 37.5-325 MG tablet Take 0.5-1 tablets by mouth every 6 (six) hours as needed (for pain.).     No current facility-administered medications on file prior to visit.     PAST MEDICAL HISTORY: Past Medical History:  Diagnosis Date  . Abnormal nuclear stress test    a. 07/2017: cath with no CAD  . Amyloidosis ([place])   . Anemia   . Crohn disease ([place])   . Crohn's colitis ([place])   . ILD (interstitial lung disease) ([place])   . OSA on CPAP   . Pernicious anemia   .  Polycythemia vera ([place])   . Sinus problem     PAST SURGICAL HISTORY: Past Surgical History:  Procedure Laterality Date  . COLON RESECTION    . HIP ARTHROPLASTY    . LEFT HEART CATH AND CORONARY ANGIOGRAPHY N/A 07/28/2017   Procedure: Left Heart Cath and Coronary Angiography;  Surgeon: Burnell Blanks, MD;  Location: [facility];  Service: Cardiovascular;  Laterality: N/A;    SOCIAL HISTORY: Social History   Tobacco Use  . Smoking status: Former Smoker    Packs/day: 1.00    Years: 45.00    Pack years: 45.00    Types: Cigarettes    Last attempt to quit: 12/26/2008    Years since quitting: 10.1  . Smokeless tobacco: Never Used  Substance Use Topics  . Alcohol use: Yes    Comment: occ  . Drug use: No  FAMILY HISTORY: Family History  Problem Relation Age of Onset  . Glaucoma Mother   . High blood pressure Mother   . High Cholesterol Mother   . Sleep apnea Mother   . Diabetes Mellitus II Father   . Sleep apnea Father   . Anxiety disorder Father   . COPD Brother   . Heart disease Brother   . Non-Hodgkin's lymphoma Brother   . Diabetes Mellitus II Brother   . Glaucoma Brother     ROS: Review of Systems  Constitutional: Negative for weight loss.  Gastrointestinal: Negative for nausea.  Endo/Heme/Allergies:       Negative hypoglycemia  Psychiatric/Behavioral: Positive for depression. Negative for suicidal ideas.       + Anxiety    PHYSICAL EXAM: Blood pressure 102/60, pulse 76, temperature 97.9 F (36.6 C), temperature source Oral, height 5' 3" (1.6 m), weight 224 lb (101.6 kg), SpO2 96 %. Body mass index is 39.68 kg/m. Physical Exam Vitals signs reviewed.  Constitutional:      Appearance: Normal appearance. She is obese.  Cardiovascular:     Rate and Rhythm: Normal rate.     Pulses: Normal pulses.  Pulmonary:     Effort: Pulmonary effort is normal.     Breath sounds: Normal breath sounds.  Musculoskeletal: Normal range of motion.  Skin:     General: Skin is warm and dry.  Neurological:     Mental Status: She is alert and oriented to person, place, and time.  Psychiatric:        Mood and Affect: Mood normal.        Behavior: Behavior normal.     RECENT LABS AND TESTS: BMET    Component Value Date/Time   NA 141 01/21/2019 1321   K 4.4 01/21/2019 1321   CL 103 01/21/2019 1321   CO2 23 01/21/2019 1321   GLUCOSE 101 (H) 01/21/2019 1321   GLUCOSE 119 (H) 07/29/2017 0553   BUN 21 01/21/2019 1321   CREATININE 0.75 01/21/2019 1321   CALCIUM 9.4 01/21/2019 1321   GFRNONAA 82 01/21/2019 1321   GFRAA 95 01/21/2019 1321   Lab Results  Component Value Date   HGBA1C 5.9 (H) 01/21/2019   HGBA1C 5.9 (H) 08/07/2018   HGBA1C 6.6 (H) 04/05/2018   Lab Results  Component Value Date   INSULIN 23.6 01/21/2019   INSULIN 16.4 08/07/2018   INSULIN 30.6 (H) 04/05/2018   CBC    Component Value Date/Time   WBC 6.8 01/21/2019 1321   RBC 4.87 01/21/2019 1321   HGB 14.4 01/21/2019 1321   HCT 44.0 01/21/2019 1321   PLT 342 07/25/2017 1544   MCV 90 01/21/2019 1321   MCH 29.6 01/21/2019 1321   MCHC 32.7 01/21/2019 1321   RDW 14.4 01/21/2019 1321   LYMPHSABS 1.6 01/21/2019 1321   EOSABS 0.1 01/21/2019 1321   BASOSABS 0.0 01/21/2019 1321   Iron/TIBC/Ferritin/ %Sat    Component Value Date/Time   IRON 111 04/05/2018 1009   TIBC 311 04/05/2018 1009   FERRITIN 71 04/05/2018 1009   IRONPCTSAT 36 04/05/2018 1009   Lipid Panel     Component Value Date/Time   CHOL 178 01/21/2019 1321   TRIG 74 01/21/2019 1321   HDL 61 01/21/2019 1321   LDLCALC 102 (H) 01/21/2019 1321   Hepatic Function Panel     Component Value Date/Time   PROT 7.0 01/21/2019 1321   ALBUMIN 4.2 01/21/2019 1321   AST 12 01/21/2019 1321   ALT 20 01/21/2019 1321  ALKPHOS 79 01/21/2019 1321   BILITOT 0.5 01/21/2019 1321      Component Value Date/Time   TSH 0.445 (L) 01/21/2019 1321   TSH 0.821 04/05/2018 1009      OBESITY BEHAVIORAL INTERVENTION  VISIT  Today's visit was # 12   Starting weight: 251 lbs Starting date: 04/05/18 Today's weight : 224 lbs Today's date: 02/18/2019 Total lbs lost to date: 27 At least 15 minutes were spent on discussing the following behavioral intervention visit.   ASK: We discussed the diagnosis of obesity with [name] today and Kamy agreed to give [name] permission to discuss obesity behavioral modification therapy today.  ASSESS: Lana has the diagnosis of obesity and her BMI today is 39.69 Taquila is in the action stage of change   ADVISE: Cydney was educated on the multiple health risks of obesity as well as the benefit of weight loss to improve her health. She was advised of the need for long term treatment and the importance of lifestyle modifications to improve her current health and to decrease her risk of future health problems.  AGREE: Multiple dietary modification options and treatment options were discussed and  Ahmyah agreed to follow the recommendations documented in the above note.  ARRANGE: Kendyl was educated on the importance of frequent visits to treat obesity as outlined per CMS and USPSTF guidelines and agreed to schedule her next follow up appointment today.  I, Trixie Dredge, am acting as transcriptionist for Dennard Nip, MD  I have reviewed the above documentation for accuracy and completeness, and I agree with the above. -Dennard Nip, MD

## 2019-02-22 ENCOUNTER — Encounter (INDEPENDENT_AMBULATORY_CARE_PROVIDER_SITE_OTHER): Payer: Self-pay | Admitting: Family Medicine

## 2019-02-25 ENCOUNTER — Other Ambulatory Visit (INDEPENDENT_AMBULATORY_CARE_PROVIDER_SITE_OTHER): Payer: Self-pay

## 2019-02-25 DIAGNOSIS — F418 Other specified anxiety disorders: Secondary | ICD-10-CM

## 2019-02-25 MED ORDER — FLUOXETINE HCL 10 MG PO CAPS: 10.0000 mg | ORAL_CAPSULE | Freq: Every day | ORAL | 0 refills | Status: DC

## 2019-03-06 ENCOUNTER — Other Ambulatory Visit: Payer: Self-pay

## 2019-03-06 ENCOUNTER — Ambulatory Visit (INDEPENDENT_AMBULATORY_CARE_PROVIDER_SITE_OTHER)
Admission: RE | Admit: 2019-03-06 | Discharge: 2019-03-06 | Disposition: A | Discharge status: Home / Self Care | Payer: Medicare Other | Source: Ambulatory Visit | Attending: Internal Medicine | Admitting: Internal Medicine

## 2019-03-06 DIAGNOSIS — J849 Interstitial pulmonary disease, unspecified: Secondary | ICD-10-CM | POA: Diagnosis not present

## 2019-03-11 ENCOUNTER — Encounter (INDEPENDENT_AMBULATORY_CARE_PROVIDER_SITE_OTHER): Payer: Self-pay | Admitting: Dietician

## 2019-03-11 ENCOUNTER — Ambulatory Visit (INDEPENDENT_AMBULATORY_CARE_PROVIDER_SITE_OTHER): Payer: Medicare Other | Admitting: Dietician

## 2019-03-11 ENCOUNTER — Other Ambulatory Visit: Payer: Self-pay

## 2019-03-11 VITALS — Ht 63.0 in | Wt 227.0 lb

## 2019-03-11 DIAGNOSIS — Z6841 Body Mass Index (BMI) 40.0 and over, adult: Principal | ICD-10-CM

## 2019-03-11 DIAGNOSIS — E119 Type 2 diabetes mellitus without complications: Secondary | ICD-10-CM | POA: Diagnosis not present

## 2019-03-12 ENCOUNTER — Other Ambulatory Visit: Payer: Self-pay

## 2019-03-12 ENCOUNTER — Ambulatory Visit (INDEPENDENT_AMBULATORY_CARE_PROVIDER_SITE_OTHER): Payer: Medicare Other | Admitting: Internal Medicine

## 2019-03-12 ENCOUNTER — Encounter: Payer: Self-pay | Admitting: Internal Medicine

## 2019-03-12 VITALS — BP 120/78 | HR 79 | Ht 63.25 in | Wt 232.0 lb

## 2019-03-12 DIAGNOSIS — J849 Interstitial pulmonary disease, unspecified: Secondary | ICD-10-CM

## 2019-03-12 DIAGNOSIS — J439 Emphysema, unspecified: Secondary | ICD-10-CM

## 2019-03-12 DIAGNOSIS — Z23 Encounter for immunization: Secondary | ICD-10-CM

## 2019-03-12 DIAGNOSIS — J8410 Pulmonary fibrosis, unspecified: Secondary | ICD-10-CM | POA: Diagnosis not present

## 2019-03-12 LAB — PULMONARY FUNCTION TEST
DL/VA % pred: 72 %
DL/VA: 3.01 ml/min/mmHg/L
DLCO unc % pred: 72 %
DLCO unc: 13.77 ml/min/mmHg
FEF 25-75 Pre: 1.04 L/sec
FEF2575-%Pred-Pre: 53 %
FEV1-%Pred-Pre: 80 %
FEV1-Pre: 1.83 L
FEV1FVC-%Pred-Pre: 87 %
FEV6-%Pred-Pre: 94 %
FEV6-Pre: 2.7 L
FEV6FVC-%Pred-Pre: 103 %
FVC-%Pred-Pre: 91 %
FVC-Pre: 2.73 L
Pre FEV1/FVC ratio: 67 %
Pre FEV6/FVC Ratio: 99 %

## 2019-03-12 MED ORDER — TIOTROPIUM BROMIDE MONOHYDRATE 1.25 MCG/ACT IN AERS: 2.0000 | INHALATION_SPRAY | Freq: Every day | RESPIRATORY_TRACT | 0 refills | Status: DC

## 2019-03-12 NOTE — Addendum Note (Signed)
Addended by: Lorretta Harp on: 03/12/2019 04:54 PM   Modules accepted: Orders

## 2019-03-12 NOTE — Patient Instructions (Addendum)
Pulmonary emphysema with fibrosis of lung ([place])  - continue o2 with exertion and exercise  - have pneumovax vaccine 03/12/2019   ILD (interstitial lung disease) (HCC)  - does not appear to be IPF or Autoimmune - seems to be a non-progressive variety called NSIP - given stability will continue to monitor and if gets worse can consider anti-fibrotic  Pulmonary emphysema, unspecified emphysema type (HCC)  - start spiriva respimat  Followup 6 months do spirometry/dlco Return to regular clinic in  6 montsh

## 2019-03-12 NOTE — Progress Notes (Signed)
Office: [phone]  /  Fax: [phone]     Patty [name] has a diagnosis of diabetes type II putting her at a higher than average risk for diabetic complications such as nephropathy, neuropathy, limb loss, blindness, coronary artery disease, and death . Patty [name] states patient does not check sugars and denies any hypoglycemic episodes. Last A1c was Hemoglobin A1C Latest Ref Rng & Units 01/21/2019 08/07/2018 04/05/2018  HGBA1C 4.8 - 5.6 % 5.9(H) 5.9(H) 6.6(H)  Some recent data might be hidden    She has been working on intensive lifestyle modifications including diet, exercise, and weight loss to help control her blood glucose levels. She is here today for cardiovascular disease risk nutrition counseling which includes her obesity treatment plan.   Patty [name]'s weight today is 227 lbs, a 3 lb weight gain since her last visit. She has had a weight loss of 24 lbs since beginning treatment with [name]. She is on a journaling meal plan of 1400-1600 calories and 85+ g protein per day. She has been journaling her food intake using the My Fitness Pal application and has been recording her intake approximately 95% of the time. Per review of her food record she is meeting her nutrition goals almost daily and is frustrated with her lack of further weight loss. She admits she is not taking the Meformin or topiramate as prescribed by her physician however continues to take multiple over the counter supplements. She reports polyphagia and will occasionally get up in the middle of the night to eat. Patient was educated on the importance of taking her prescribed medicine.      Reviewed the importance of  food nutrients ie protein, fats, simple and complex carbohydrates and how these affect insulin response, hunger and her weight.  Patty [name] is on the following meal plan: journaling 1400-1600 calories and 85+ g protein.  Her  meal plan was individualized for maximum benefit.  Also discussed at length the following behavioral  modifications to help maximize success: increasing lean protein intake, ways to avoid night time snacking, keeping a strict food journal, increasing fruit and vegetable intake.    Patty [name] has been instructed to work up to a goal of 150 minutes of combined cardio and strengthening exercise per week for weight loss and overall health benefits. Written information was provided and the following handouts were given: Insulin resistance/prediabetes/diabetes.   OBESITY BEHAVIORAL INTERVENTION VISIT  Today's visit was # 13   Starting weight: 251 lbs Starting date: 04/05/18 Today's weight : Weight: 227 lb (103 kg)  Today's date: 03/11/2019 Total lbs lost to date: 24 lbs At least 15 minutes were spent on discussing the following behavioral intervention visit.   ASK: We discussed the diagnosis of obesity with [name] today and [name] agreed to give [name] permission to discuss obesity behavioral modification therapy today.   ASSESS: Patty [name] has the diagnosis of obesity and her BMI today is 40. Patty [name] is in the action stage of change   ADVISE: Patty [name] was educated on the multiple health risks of obesity as well as the benefit of weight loss to improve her health. She was advised of the need for long term treatment and the importance of lifestyle modifications to improve her current health and to decrease her risk of future health problems.  AGREE: Multiple dietary modification options and treatment options were discussed and  Patty [name] agreed to follow the recommendations documented in the above note.  ARRANGE: Patty [name] was educated on the importance of frequent visits to treat obesity as outlined per  CMS and USPSTF guidelines and agreed to schedule her next follow up appointment today.

## 2019-03-12 NOTE — Progress Notes (Signed)
Spirometry and Dlco done today. 

## 2019-03-12 NOTE — Progress Notes (Signed)
_0  ID: Patty [name], female    DOB: 12-Jul-1951, 68 y.o.   MRN: [id number]  Chief Complaint  Patient presents with   Follow-up    dyspnea     Referring provider: No ref. provider found  HPI: 68 year old female former smoker seen for pulmonary consulokay guarded because then maybe they can meet him right now for the registryt 05/09/2017 for progressive dyspnea since 2004.[name] Patient has polycythemia vera followed by hematology and [place] She has Crohn's disease    IOV  05/09/2017  Chief Complaint  Patient presents with   Pulmonary Consult    Pt referred for SOB with activity x years. Pt states over the last year the DOE has worsened. Pt denies cough and CP/tightness and f/c/s.     68 year old obese female. In 2004 Started noticing insidious onset of shortness of breath. In 2005 more [place] from [place] and probably gain over 60 pounds of weight. She has extensive workup at that time according to her history. She was then diagnosed with polycythemia by Dr. [name] at [facility]. She does not recollect any phlebotomies for this. However in the last 4-5 years she's had worsening shortness of breath. Noticeable with exertion particularly in the gym and doing standing exercises but not so much with sitting exercises. Relieved by rest. When she does some yard work she notices some associated wheezing as well.  Walking desaturation test in the office she did desaturate to 88% and did get tachycardic. She says that a chest x-ray done by primary care physicians interstitial pulmonary fibrosis. I personally visualized the chest x-ray done 04/03/2017: There is some interstitial markings but it is obscured by her obesity. I'm not so certain that is definite ILD. There is no lab work in the record. There is no PFT a CT scan available for visualization     05/29/2017 Follow up : Dyspnea  Patient returns for a two-week follow-up. Patient was seen for pulmonary consult  05/09/2017 for progressive dyspnea since 2004. Patient was set up for a high resolution CT chest that showed a very mild basilar predominant subpleural reticulation and bronchiectasis which could be due to nonspecific interstitial pneumonitis.. CT did have incidental findings of enlarged pulmonary arteries and aortic and coronary calcification. Patient had pulmonary function test on May 23 that showed an FEV1 at 77%, ratio 73, FVC 81, no significant bronchodilator response, total lung capacity 100%, DLCO 52%. Overnight oximetry test did show significant desaturations. We discussed beginning oxygen at bedtime.  Patient denies significant cough. Says that she get short of breath with walking. Has daytime fatigue and low energy..  Interstitial lung disease patient questionnaire was completed as follows Patient has no previous use of Macrodantin, amiodarone, methotrexate. She has been treated with prednisone for her Crohn's in the past. Patient has had extensive travel with brief visits to multiple state and country's.. [place]. Did live in a apartment that she did notice mold for 4 years. She does have Crohn's disease and was on Mesamaline for many years. Patient says she has had a humidifier and hot tub and previous house. She has no indoor birds. She does have a dog. Patient says she was told that she had a blood clot in the past but was never found.   OV 08/15/2017  Chief Complaint  Patient presents with   Follow-up    Pt still gets occ. SOB. Other than that, pt states that she has been doing good.  Denies any cough or CP.     Follow-up multifactorial dyspnea with interstitial lung disease concern.   Regarding interstitial lung disease concern: Mid May 2018 she had high resolution CT chest that showed interstitial lung disease very mild but unclear if possible UIP pattern. Autoimmune test was negative. Isolated reduction in diffusion capacity 50%. [employer] of chest  physicians questionnaire shows previous Crohn's disease and also mold exposure previously. Overall she feels stable  Regarding dyspnea: She underwent echocardiogram in June 2018 showed grade 1 diastolic dysfunction. Had abnormal cardiac stress test July 2018 followed by cardiac cath early August 2018 that showed normal coronary angiogram but elevated left ventricular end-diastolic pressure. Dietary therapy has been recommended but she is unaware of these results.  Overall she is here to discuss these results.  IMPRESSION: 1. Suspect very mild basilar predominant subpleural reticulation and bronchiolectasis, which may be due to nonspecific interstitial pneumonitis. 2. Aortic atherosclerosis (ICD10-170.0). Coronary artery calcification. 3. Enlarged pulmonary arteries, indicative of pulmonary arterial hypertension.   Electronically Signed   By: Lorin Picket M.D.   On: 05/13/2017 07:33   OV 10/17/2017  Chief Complaint  Patient presents with   Follow-up    Pt states that she has had good days and bad days since last visit. States that when she was in [place] for a month, breathing was better; still became SOB but not as bad as in [place]. Pt's SOB is mainly on exertion. Denies any cough or CP.    Follow-up dyspnea that is multifactorial due to obesity, physical deconditioning and diastolic dysfunction Follow-up mild interstitial lung disease not otherwise specified  Last visit I started her on Lasix. I was only supposed to see herin 6 months or so. However she's had problems tolerating Lasix and potassium. It fluctuates between palpitations and hypertension. She feels the palpitations might be related to potassium depletion after taking Lasix. But she also tells me that the Lasix does help her dyspnea. At this point in time her condition is to see cardiologist. Off note she did send the email message asking these questions on 09/28/2017 made a reply that she tells me that she never got  the reply.   OV 02/13/2018  Chief Complaint  Patient presents with   Follow-up    PFT done 02/05/18.  Pt states she has been doing good. Was sick in january but states she is doing better.  DME: [facility] 4L pulse    Follow-up dyspnea that is multifactorial due to obesity, physical deconditioning and diastolic dysfunction Follow-up mild interstitial lung disease not otherwise specified v concern   68 year old female with concern for interstitial lung disease.  Since her last visit she continues to do well.  She is attending pulmonary rehabilitation.  She feels better and less short of breath.  She uses oxygen with exertion at rehab saying she needs it.  She has upcoming travel in summer to [place] and wanted some flight information oxygen form filled out.  She is wondering if mesalamine that she took several years ago was the cause of interstitial lung disease or her Crohn's disease.  But overall she is stable.  Pulmonary function test shows stability and FVC since last 1 year with some improvement in DLCO.  High-resolution CT scan of the chest interpreted by thoracic radiology report suspected ILD.   OV 08/28/2018  Chief Complaint  Patient presents with   Follow-up    PFT performed today.  Pt states she has had both good and bad days since last  visit. Pt denies any complaints of cough, SOB, or CP but states she is having some problems with congestion since she has been back from vacation. Pt has been having problems with getting O2 supplies taken care of with DME.    Follow-up dyspnea that is multifactorial due to obesity, physical deconditioning and diastolic dysfunction Follow-up concern for  mild interstitial lung disease not otherwise specified  Patty [name] - Presents for follow-up for the above issues. Her dyspnea significantly better following a 30 pound weight loss and continued rehabilitation. She uses oxygen with rehabilitation. Today walking desaturation test 185 feet 3 laps on  room air: She dropped to 87% on the second lap. But she is feeling better. She had spirometry today that shows improvement in FVC concomitant with weight loss but no change in diffusion capacity that reflects the presence of ongoing possible ILD.She continues to lose more weight she believes that she could lose another 30 pounds before the next visit.     OV 03/12/2019  Subjective:  Patient ID: Patty [name], female , DOB: 04-May-1951 , age 10 y.o. , MRN: [id number] , ADDRESS: [place] [id number]   03/12/2019 -   Chief Complaint  Patient presents with   Follow-up    PFT performed today.  Pt states she has been doing good since last visit. States she still becomes SOB with exertion, and has phlegm in the mornings, postnasal drainage which has used flonase to help. Pt also uses POC as needed.    Follow-up dyspnea that is multifactorial due to obesity, physical deconditioning and diastolic dysfunction Follow-up  mild interstitial lung disease not otherwise specifieds -with stability 2018 through 2020 [non-IPF pattern]; on observation   HPI Patty [name] 68 y.o. -presents for follow-up.  She is attending pulmonary rehabilitation and using oxygen with exertion.  Overall she feels stable.  Subjective symptom parameters and objective parameters are documented below.  She had high-resolution CT chest that showed shows that she has ILD.  At this point we can be confident that she has mild ILD but it is stable based on symptoms walking test and pulmonary function test.  The previous autoimmune test was negative.   SYMPTOM SCALE - ILD 03/12/2019   O2 use RA  Shortness of Breath 0 -> 5 scale with 5 being worst (score 6 If unable to do)  At rest 0  Simple tasks - showers, clothes change, eating, shaving 0  Household (dishes, doing bed, laundry) 0  Shopping 0  Walking level at own pace 0  Walking keeping up with others of same age 97  Walking up Stairs 1-2  Walking up [place] 1-2   Total (40 - 48) Dyspnea Score 2  How bad is your cough? 0  How bad is your fatigue 0        Simple office walk 185 feet x  3 laps goal with forehead probe 03/12/2019   O2 used Room air  Number laps completed 3  Comments about pace slow  Resting Pulse Ox/HR 100% and 85/min  Final Pulse Ox/HR 88% and 135/min  Desaturated </= 88% yes  Desaturated <= 3% points Yes, 12  Got Tachycardic >/= 90/min yes  Symptoms at end of test Mild dyspnea  Miscellaneous comments x      Results for JENAY, MORICI (MRN [id number]) as of 03/12/2019 16:21  Ref. Range 05/17/2017 13:11 02/05/2018 12:14 08/28/2018 11:22 03/12/2019 14:47  FVC-Pre Latest Units: L 2.61 2.57 2.80 2.73  FVC-%Pred-Pre Latest Units: % 84 83  94 91    Results for ELTON, CATALANO (MRN [id number]) as of 03/12/2019 16:21  Ref. Range 05/17/2017 13:11 02/05/2018 12:14 08/28/2018 11:22 03/12/2019 14:47  DLCO unc Latest Units: ml/min/mmHg 12.64 15.15 14.53 13.77  DLCO unc % pred Latest Units: % 52 62 62 72   IMPRESSION: 1. Very mild scattered basilar subpleural reticulation and ground-glass similar to 01/29/2018. Nonspecific interstitial pneumonitis can not be excluded. Findings are indeterminate for UIP per consensus guidelines: Diagnosis of Idiopathic Pulmonary Fibrosis: An Official ATS/ERS/JRS/ALAT Clinical Practice Guideline. [place], Iss 5, [id number], Aug 26 2017. 2. Question cirrhosis. 3. Aortic atherosclerosis (ICD10-170.0). Coronary artery calcification. 4. Enlarged arteries, arterial hypertension. 5.  Emphysema (ICD10-J43.9).   Electronically Signed   By: Lorin Picket M.D.   On: 03/06/2019 16:59     ROS - per HPI     has a past medical history of Abnormal nuclear stress test, Amyloidosis ([place]), Anemia, Crohn disease ([place]), Crohn's colitis ([place]), ILD (interstitial lung disease) ([place]), OSA on CPAP, Pernicious anemia, Polycythemia vera ([place]), and Sinus problem.   reports that she quit  smoking about 10 years ago. Her smoking use included cigarettes. She has a 45.00 pack-year smoking history. She has never used smokeless tobacco.  Past Surgical History:  Procedure Laterality Date   COLON RESECTION     HIP ARTHROPLASTY     LEFT HEART CATH AND CORONARY ANGIOGRAPHY N/A 07/28/2017   Procedure: Left Heart Cath and Coronary Angiography;  Surgeon: Burnell Blanks, MD;  Location: [facility];  Service: Cardiovascular;  Laterality: N/A;    Allergies  Allergen Reactions   Lasix [Furosemide] Itching    Hypersensitivity    Onion Other (See Comments)    sereve diarrhea, stomach pains (Crohn's flares)   Oxycodone Other (See Comments)    Altered mental status   Potassium-Containing Compounds Other (See Comments)    Due to chron's, difficult passing though kidneys not allowing absorption in the body    Prednisone Other (See Comments)    Altered mood   Sulfa Drugs Cross Reactors Other (See Comments)    "feels like bugs crawling on me"    Immunization History  Administered Date(s) Administered   Influenza, High Dose Seasonal PF 08/28/2018   Influenza-Unspecified 11/23/2017   Pneumococcal Conjugate-13 02/13/2018    Family History  Problem Relation Age of Onset   Glaucoma Mother    High blood pressure Mother    High Cholesterol Mother    Sleep apnea Mother    Diabetes Mellitus II Father    Sleep apnea Father    Anxiety disorder Father    COPD Brother    Heart disease Brother    Non-Hodgkin's lymphoma Brother    Diabetes Mellitus II Brother    Glaucoma Brother      Current Outpatient Medications:    aspirin EC 81 MG tablet, Take 81 mg by mouth daily., Disp: , Rfl:    B Complex-C (B-COMPLEX WITH VITAMIN C) tablet, Take 1 tablet by mouth daily., Disp: , Rfl:    Biotin 5000 MCG SUBL, Place 5,000 mcg under the tongue daily., Disp: , Rfl:    Cetirizine HCl (ZYRTEC ALLERGY) 10 MG CAPS, TAKE 1 TABLET DAILY ALONG WITH THE LASIX,  Disp: 90 capsule, Rfl: 3   clonazePAM (KLONOPIN) 0.5 MG tablet, Take 0.125-0.25 mg by mouth daily as needed (for anxiety or sleep.)., Disp: , Rfl:    cyanocobalamin (,VITAMIN B-12,) 1000 MCG/ML injection, Inject 1,000 mcg into the muscle every  30 (thirty) days. Pernicious anemia, Disp: , Rfl:    Cyanocobalamin (VITAMIN B-12) 5000 MCG SUBL, Place 5,000 mcg under the tongue daily., Disp: , Rfl:    cyclopentolate (CYCLODRYL,CYCLOGYL) 2 % ophthalmic solution, Place 1 drop into both eyes 3 (three) times daily as needed (for uveitis or iritis)., Disp: , Rfl:    FLUoxetine (PROZAC) 10 MG capsule, Take 1 capsule (10 mg total) by mouth daily., Disp: 30 capsule, Rfl: 0   fluticasone (FLONASE) 50 MCG/ACT nasal spray, as needed. , Disp: , Rfl:    Magnesium 250 MG TABS, Take 250 mg by mouth every other day., Disp: , Rfl:    metFORMIN (GLUCOPHAGE) 500 MG tablet, Take by mouth., Disp: , Rfl:    Multiple Vitamins-Minerals (ICAPS AREDS 2 PO), Take 1 tablet by mouth every other day. , Disp: , Rfl:    Niacinamide-Zinc-Copper-FA (NICOTINAMIDE ZCF PO), Take 1 capsule by mouth daily. Nicotinamide Riboside-Pterostilbene 250-50 mg ([employer]), Disp: , Rfl:    OVER THE COUNTER MEDICATION, Place 1 each under the tongue daily. 3-4 DROP DAILY (CBD OIL), Disp: , Rfl:    Probiotic Product (PROBIOTIC PO), Take 1 capsule by mouth once a week., Disp: , Rfl:    topiramate (TOPAMAX) 50 MG tablet, Take 1 tablet (50 mg total) by mouth daily., Disp: 30 tablet, Rfl: 0   traMADol-acetaminophen (ULTRACET) 37.5-325 MG tablet, Take 0.5-1 tablets by mouth every 6 (six) hours as needed (for pain.)., Disp: , Rfl:    Tiotropium Bromide Monohydrate (SPIRIVA RESPIMAT) 1.25 MCG/ACT AERS, Inhale 2 puffs into the lungs daily., Disp: 1 Inhaler, Rfl: 0      Objective:   Vitals:   03/12/19 1540  BP: 120/78  Pulse: 79  SpO2: 95%  Weight: 232 lb (105.2 kg)  Height: 5' 3.25" (1.607 m)    Estimated body  mass index is 40.77 kg/m as calculated from the following:   Height as of this encounter: 5' 3.25" (1.607 m).   Weight as of this encounter: 232 lb (105.2 kg).  _0 @  Filed Weights   03/12/19 1540  Weight: 232 lb (105.2 kg)     Physical Exam  General Appearance:    Alert, cooperative, no distress, appears stated age - yes , Deconditioned looking - no , OBESE  - yes, Sitting on Wheelchair -  no  Head:    Normocephalic, without obvious abnormality, atraumatic  Eyes:    PERRL, conjunctiva/corneas clear,  Ears:    Normal TM's and external ear canals, both ears  Nose:   Nares normal, septum midline, mucosa normal, no drainage    or sinus tenderness. OXYGEN ON  - no . Patient is @ ra   Throat:   Lips, mucosa, and tongue normal; teeth and gums normal. Cyanosis on lips - no  Neck:   Supple, symmetrical, trachea midline, no adenopathy;    thyroid:  no enlargement/tenderness/nodules; no carotid   bruit or JVD  Back:     Symmetric, no curvature, ROM normal, no CVA tenderness  Lungs:     Distress - no , Wheeze no, Barrell Chest - no, Purse lip breathing - no, Crackles - no   Chest Wall:    No tenderness or deformity.    Heart:    Regular rate and rhythm, S1 and S2 normal, no rub   or gallop, Murmur - no  Breast Exam:    NOT DONE  Abdomen:     Soft, non-tender, bowel sounds active all four quadrants,  no masses, no organomegaly. Visceral obesity - yes  Genitalia:   NOT DONE  Rectal:   NOT DONE  Extremities:   Extremities - normal, Has Cane - no, Clubbing - no, Edema - no  Pulses:   2+ and symmetric all extremities  Skin:   Stigmata of Connective Tissue Disease - no  Lymph nodes:   Cervical, supraclavicular, and axillary nodes normal  Psychiatric:  Neurologic:   Pleasant - yes, Anxious - no, Flat affect - no  CAm-ICU - neg, Alert and Oriented x 3 - yes, Moves all 4s - yes, Speech - normal, Cognition - intact           Assessment:       ICD-10-CM   1. Pulmonary  emphysema with fibrosis of lung ([place]) J43.9    J84.10   2. ILD (interstitial lung disease) ([place]) J84.9   3. Pulmonary emphysema, unspecified emphysema type ([place]) J43.9        Plan:     Patient Instructions  Pulmonary emphysema with fibrosis of lung ([place])  - continue o2 with exertion and exercise  - have pneumovax vaccine 03/12/2019   ILD (interstitial lung disease) ([place])  - does not appear to be IPF or Autoimmune - seems to be a non-progressive variety called NSIP - given stability will continue to monitor and if gets worse can consider anti-fibrotic  Pulmonary emphysema, unspecified emphysema type ([place])  - start spiriva respimat  Followup 6 months do spirometry/dlco Return to regular clinic in  6 montsh       SIGNATURE    Dr. Brand Males, M.D., F.C.C.P,  Pulmonary and Critical Care Medicine Staff Physician, [place] Director - Interstitial Lung Disease  Program  Pulmonary [facility] [place], [place], [address]  Pager: [phone], If no answer or between  15:00h - 7:00h: call [phone] Telephone: [phone]  4:45 PM 03/12/2019

## 2019-03-15 ENCOUNTER — Encounter (INDEPENDENT_AMBULATORY_CARE_PROVIDER_SITE_OTHER): Payer: Self-pay | Admitting: Family Medicine

## 2019-03-19 ENCOUNTER — Encounter (INDEPENDENT_AMBULATORY_CARE_PROVIDER_SITE_OTHER): Payer: Self-pay

## 2019-03-19 ENCOUNTER — Ambulatory Visit (INDEPENDENT_AMBULATORY_CARE_PROVIDER_SITE_OTHER): Payer: Medicare Other | Admitting: Family Medicine

## 2019-03-19 ENCOUNTER — Other Ambulatory Visit: Payer: Self-pay

## 2019-03-19 ENCOUNTER — Encounter (INDEPENDENT_AMBULATORY_CARE_PROVIDER_SITE_OTHER): Payer: Self-pay | Admitting: Family Medicine

## 2019-03-19 ENCOUNTER — Telehealth (INDEPENDENT_AMBULATORY_CARE_PROVIDER_SITE_OTHER): Payer: Self-pay | Admitting: Family Medicine

## 2019-03-19 DIAGNOSIS — E119 Type 2 diabetes mellitus without complications: Secondary | ICD-10-CM | POA: Diagnosis not present

## 2019-03-19 DIAGNOSIS — F3289 Other specified depressive episodes: Secondary | ICD-10-CM | POA: Diagnosis not present

## 2019-03-19 DIAGNOSIS — E66813 Obesity, class 3: Secondary | ICD-10-CM

## 2019-03-19 DIAGNOSIS — Z6841 Body Mass Index (BMI) 40.0 and over, adult: Secondary | ICD-10-CM | POA: Diagnosis not present

## 2019-03-19 MED ORDER — INSULIN PEN NEEDLE 32G X 4 MM MISC: 1.0000 | Freq: Two times a day (BID) | 0 refills | Status: DC

## 2019-03-19 MED ORDER — LIRAGLUTIDE 18 MG/3ML ~~LOC~~ SOPN: 0.6000 mg | PEN_INJECTOR | SUBCUTANEOUS | 0 refills | Status: DC

## 2019-03-19 MED ORDER — TOPIRAMATE 50 MG PO TABS: 50.0000 mg | ORAL_TABLET | Freq: Every day | ORAL | 0 refills | Status: DC

## 2019-03-19 MED ORDER — FLUOXETINE HCL 10 MG PO CAPS: 10.0000 mg | ORAL_CAPSULE | Freq: Every day | ORAL | 0 refills | Status: DC

## 2019-03-19 NOTE — Telephone Encounter (Signed)
Patient called stated she forgot to let doctor know which pharmacy to use now. Please send to [address] [place].

## 2019-03-20 NOTE — Progress Notes (Addendum)
Office: [phone]  /  Fax: [phone] TeleHealth Visit:  [name] has consented to this TeleHealth visit today via telephone due to patietns inability to access video. The patient is located at home, the provider is located at the [facility] office. The participants in this visit include the listed provider and patient and provider's assistant.  I spent > than 50% of the 25 minute visit on counseling as documented in the note.   HPI:   Chief Complaint: OBESITY Patty [name] is here to discuss her progress with her obesity treatment plan. She is on the keep a food journal with 1400-1600 calories and 85 grams of protein daily and is following her eating plan approximately 100 % of the time. She states she is doing just a little walking. Patty [name] is being seen via Facetime. She is staying home in isolation during the COVID-19 outbreak. She is struggling with eating everything on her plan, but is not certain if she has gained or lost weight. She states CT showed emphysema on last scan and Spiriva was added. Also cirrhosis of the liver was shown and is new, and she has never been a drinker. We were unable to weight the patient today for this TeleHealth visit. She feels as if she has maybe gained 3 lbs since her last visit. She has lost 27 lbs since starting treatment with Patty [name].  Diabetes II Monseratt has a diagnosis of diabetes type II. Midge restarted metformin and notes increased GI upset again. She decreased her dose but is still having GI upset. She denies hypoglycemia. Last A1c was 5.9. She has been working on intensive lifestyle modifications including diet, exercise, and weight loss to help control her blood glucose levels.  Depression with Emotional Eating Behaviors Patty [name]'s mood is stable on Prozac and topiramate. She denies insomnia or daytime somnolence. Patty [name] struggles with emotional eating and using food for comfort to the extent that it is negatively impacting her health. She  often snacks when she is not hungry. Patty [name] sometimes feels she is out of control and then feels guilty that she made poor food choices. She has been working on behavior modification techniques to help reduce her emotional eating and has been somewhat successful. She shows no sign of suicidal or homicidal ideations.  Depression screen Patty [name] 2/9 04/05/2018 10/30/2017  Decreased Interest 0 0  Down, Depressed, Hopeless 1 1  PHQ - 2 Score 1 1  Altered sleeping 0 -  Tired, decreased energy 1 -  Change in appetite 1 -  Feeling bad or failure about yourself  1 -  Trouble concentrating 2 -  Moving slowly or fidgety/restless 0 -  Suicidal thoughts 0 -  PHQ-9 Score 6 -  Difficult doing work/chores Not difficult at all -    ASSESSMENT AND PLAN:  Type 2 diabetes mellitus without complication, without long-term current use of insulin (HCC) - Plan: liraglutide (VICTOZA) 18 MG/3ML SOPN, Insulin Pen Needle (BD PEN NEEDLE NANO 2ND GEN) 32G X 4 MM MISC  Other depression - with emotional eating - Plan: topiramate (TOPAMAX) 50 MG tablet, FLUoxetine (PROZAC) 10 MG capsule  Class 3 severe obesity with serious comorbidity and body mass index (BMI) of 40.0 to 44.9 in adult, unspecified obesity type (HCC)  PLAN:  Diabetes II [name] has been given extensive diabetes education by myself today including ideal fasting and post-prandial blood glucose readings, individual ideal Hgb A1c goals and hypoglycemia prevention. We discussed the importance of good blood sugar control to decrease the likelihood  of diabetic complications such as nephropathy, neuropathy, limb loss, blindness, coronary artery disease, and death. We discussed the importance of intensive lifestyle modification including diet, exercise and weight loss as the first line treatment for diabetes. Patty [name] agrees to start Victoza 0.6 mg SubQ AM #1 pen, with no refills and nano needles #100 with no refills and will [facility]. She will discontinue  metformin. Patty [name] agrees to follow up with our clinic in 5 weeks.  Depression with Emotional Eating Behaviors We discussed behavior modification techniques today to help Patty [name] deal with her emotional eating and depression. Patty [name] agrees to continue taking Prozac 10 mg qd #90 day supply and she agrees to continue taking topiramate 50 mg qd #90 day supply mail order. Patty [name] agrees to follow up with our clinic in 5 weeks.  Obesity Patty [name] is currently in the action stage of change. As such, her goal is to continue with weight loss efforts She has agreed to keep a food journal with 1400-1600 calories and 85 grams of protein daily or follow the Category 3 plan Patty [name] has been instructed to work up to a goal of 150 minutes of combined cardio and strengthening exercise per week for weight loss and overall health benefits. We discussed the following Behavioral Modification Strategies today: increasing lean protein intake, decreasing simple carbohydrates, work on meal planning and easy cooking plans, emotional eating strategies, ways to avoid boredom eating, ways to avoid night time snacking, keeping healthy foods in the home, and better snacking choices   Patty [name] has agreed to follow up with our clinic in 5 weeks. She was informed of the importance of frequent follow up visits to maximize her success with intensive lifestyle modifications for her multiple health conditions.  ALLERGIES: Allergies  Allergen Reactions   Lasix [Furosemide] Itching    Hypersensitivity    Onion Other (See Comments)    sereve diarrhea, stomach pains (Crohn's flares)   Oxycodone Other (See Comments)    Altered mental status   Potassium-Containing Compounds Other (See Comments)    Due to chron's, difficult passing though kidneys not allowing absorption in the body    Prednisone Other (See Comments)    Altered mood   Sulfa Drugs Cross Reactors Other (See Comments)    "feels like bugs crawling on me"     MEDICATIONS: Current Outpatient Medications on File Prior to Visit  Medication Sig Dispense Refill   aspirin EC 81 MG tablet Take 81 mg by mouth daily.     B Complex-C (B-COMPLEX WITH VITAMIN C) tablet Take 1 tablet by mouth daily.     Biotin 5000 MCG SUBL Place 5,000 mcg under the tongue daily.     Cetirizine HCl (ZYRTEC ALLERGY) 10 MG CAPS TAKE 1 TABLET DAILY ALONG WITH THE LASIX 90 capsule 3   clonazePAM (KLONOPIN) 0.5 MG tablet Take 0.125-0.25 mg by mouth daily as needed (for anxiety or sleep.).     cyanocobalamin (,VITAMIN B-12,) 1000 MCG/ML injection Inject 1,000 mcg into the muscle every 30 (thirty) days. Pernicious anemia     Cyanocobalamin (VITAMIN B-12) 5000 MCG SUBL Place 5,000 mcg under the tongue daily.     cyclopentolate (CYCLODRYL,CYCLOGYL) 2 % ophthalmic solution Place 1 drop into both eyes 3 (three) times daily as needed (for uveitis or iritis).     fluticasone (FLONASE) 50 MCG/ACT nasal spray as needed.      Magnesium 250 MG TABS Take 250 mg by mouth every other day.     metFORMIN (GLUCOPHAGE) 500  MG tablet Take by mouth.     Multiple Vitamins-Minerals (ICAPS AREDS 2 PO) Take 1 tablet by mouth every other day.      Niacinamide-Zinc-Copper-FA (NICOTINAMIDE ZCF PO) Take 1 capsule by mouth daily. Nicotinamide Riboside-Pterostilbene 250-50 mg ([employer])     OVER THE COUNTER MEDICATION Place 1 each under the tongue daily. 3-4 DROP DAILY (CBD OIL)     Probiotic Product (PROBIOTIC PO) Take 1 capsule by mouth once a week.     Tiotropium Bromide Monohydrate (SPIRIVA RESPIMAT) 1.25 MCG/ACT AERS Inhale 2 puffs into the lungs daily. 1 Inhaler 0   traMADol-acetaminophen (ULTRACET) 37.5-325 MG tablet Take 0.5-1 tablets by mouth every 6 (six) hours as needed (for pain.).     No current facility-administered medications on file prior to visit.     PAST MEDICAL HISTORY: Past Medical History:  Diagnosis Date   Abnormal nuclear stress test     a. 07/2017: cath with no CAD   Amyloidosis ([place])    Anemia    Crohn disease ([place])    Crohn's colitis ([place])    ILD (interstitial lung disease) ([place])    OSA on CPAP    Pernicious anemia    Polycythemia vera ([place])    Sinus problem     PAST SURGICAL HISTORY: Past Surgical History:  Procedure Laterality Date   COLON RESECTION     HIP ARTHROPLASTY     LEFT HEART CATH AND CORONARY ANGIOGRAPHY N/A 07/28/2017   Procedure: Left Heart Cath and Coronary Angiography;  Surgeon: Burnell Blanks, MD;  Location: [facility];  Service: Cardiovascular;  Laterality: N/A;    SOCIAL HISTORY: Social History   Tobacco Use   Smoking status: Former Smoker    Packs/day: 1.00    Years: 45.00    Pack years: 45.00    Types: Cigarettes    Last attempt to quit: 12/26/2008    Years since quitting: 10.2   Smokeless tobacco: Never Used  Substance Use Topics   Alcohol use: Yes    Comment: occ   Drug use: No    FAMILY HISTORY: Family History  Problem Relation Age of Onset   Glaucoma Mother    High blood pressure Mother    High Cholesterol Mother    Sleep apnea Mother    Diabetes Mellitus II Father    Sleep apnea Father    Anxiety disorder Father    COPD Brother    Heart disease Brother    Non-Hodgkin's lymphoma Brother    Diabetes Mellitus II Brother    Glaucoma Brother     ROS: Review of Systems  Constitutional: Negative for weight loss.  Endo/Heme/Allergies:       Negative hypoglycemia  Psychiatric/Behavioral: Positive for depression. Negative for suicidal ideas. The patient does not have insomnia.     PHYSICAL EXAM: Pt in no acute distress  RECENT LABS AND TESTS: BMET    Component Value Date/Time   NA 141 01/21/2019 1321   K 4.4 01/21/2019 1321   CL 103 01/21/2019 1321   CO2 23 01/21/2019 1321   GLUCOSE 101 (H) 01/21/2019 1321   GLUCOSE 119 (H) 07/29/2017 0553   BUN 21 01/21/2019 1321   CREATININE 0.75 01/21/2019 1321   CALCIUM  9.4 01/21/2019 1321   GFRNONAA 82 01/21/2019 1321   GFRAA 95 01/21/2019 1321   Lab Results  Component Value Date   HGBA1C 5.9 (H) 01/21/2019   HGBA1C 5.9 (H) 08/07/2018   HGBA1C 6.6 (H) 04/05/2018  Lab Results  Component Value Date   INSULIN 23.6 01/21/2019   INSULIN 16.4 08/07/2018   INSULIN 30.6 (H) 04/05/2018   CBC    Component Value Date/Time   WBC 6.8 01/21/2019 1321   RBC 4.87 01/21/2019 1321   HGB 14.4 01/21/2019 1321   HCT 44.0 01/21/2019 1321   PLT 342 07/25/2017 1544   MCV 90 01/21/2019 1321   MCH 29.6 01/21/2019 1321   MCHC 32.7 01/21/2019 1321   RDW 14.4 01/21/2019 1321   LYMPHSABS 1.6 01/21/2019 1321   EOSABS 0.1 01/21/2019 1321   BASOSABS 0.0 01/21/2019 1321   Iron/TIBC/Ferritin/ %Sat    Component Value Date/Time   IRON 111 04/05/2018 1009   TIBC 311 04/05/2018 1009   FERRITIN 71 04/05/2018 1009   IRONPCTSAT 36 04/05/2018 1009   Lipid Panel     Component Value Date/Time   CHOL 178 01/21/2019 1321   TRIG 74 01/21/2019 1321   HDL 61 01/21/2019 1321   LDLCALC 102 (H) 01/21/2019 1321   Hepatic Function Panel     Component Value Date/Time   PROT 7.0 01/21/2019 1321   ALBUMIN 4.2 01/21/2019 1321   AST 12 01/21/2019 1321   ALT 20 01/21/2019 1321   ALKPHOS 79 01/21/2019 1321   BILITOT 0.5 01/21/2019 1321      Component Value Date/Time   TSH 0.445 (L) 01/21/2019 1321   TSH 0.821 04/05/2018 1009      I, Trixie Dredge, am acting as Location manager for Dennard Nip, MD I have reviewed the above documentation for accuracy and completeness, and I agree with the above. -Dennard Nip, MD

## 2019-04-12 ENCOUNTER — Telehealth: Payer: Self-pay | Admitting: Internal Medicine

## 2019-04-12 DIAGNOSIS — G4733 Obstructive sleep apnea (adult) (pediatric): Secondary | ICD-10-CM

## 2019-04-12 DIAGNOSIS — Z9989 Dependence on other enabling machines and devices: Secondary | ICD-10-CM

## 2019-04-12 NOTE — Telephone Encounter (Signed)
Order placed for pt to receive supplies for cpap. Nothing further needed.

## 2019-04-12 NOTE — Telephone Encounter (Signed)
I'm fine with that

## 2019-04-12 NOTE — Telephone Encounter (Signed)
Received a call from [place]. Pt is needing new supplies for cpap and [employer] is requesting an order for cpap supplies.  Beth, please advise if you are okay with [name] placing order under you. Thanks!

## 2019-04-22 ENCOUNTER — Ambulatory Visit (INDEPENDENT_AMBULATORY_CARE_PROVIDER_SITE_OTHER): Payer: Medicare Other | Admitting: Family Medicine

## 2019-04-22 ENCOUNTER — Other Ambulatory Visit: Payer: Self-pay

## 2019-04-22 ENCOUNTER — Encounter (INDEPENDENT_AMBULATORY_CARE_PROVIDER_SITE_OTHER): Payer: Self-pay | Admitting: Family Medicine

## 2019-04-22 DIAGNOSIS — Z6841 Body Mass Index (BMI) 40.0 and over, adult: Secondary | ICD-10-CM

## 2019-04-22 DIAGNOSIS — E119 Type 2 diabetes mellitus without complications: Secondary | ICD-10-CM

## 2019-04-22 MED ORDER — LIRAGLUTIDE 18 MG/3ML ~~LOC~~ SOPN: 0.6000 mg | PEN_INJECTOR | SUBCUTANEOUS | 0 refills | Status: DC

## 2019-04-22 NOTE — Progress Notes (Signed)
Office: [phone]  /  Fax: [phone] TeleHealth Visit:  Patty [name] has verbally consented to this TeleHealth visit today. The patient is located at home, the provider is located at the [employer] [facility] office. The participants in this visit include the listed provider and patient. The visit was conducted today via Face Time.  HPI:   Chief Complaint: OBESITY Patty [name] is here to discuss her progress with her obesity treatment plan. She is keeping a food journal with 1400 to 1600 calories and 85 grams of protein and is following her eating plan approximately 85 to 90 % of the time. She states she is walking 15 minutes 4 times per week. Patty [name] feels that she is mostly maintaining her weight, but she may have gained 1 to 2 pounds as she is feeling bloated, but she hasn't weighed herself. She has been snacking a bit more, but is trying to be mindful.  We were unable to weigh the patient today for this TeleHealth visit. She feels as if she may have gained weight since her last visit. She has lost 24 lbs since starting treatment with [name].  Diabetes II Patty [name] has a diagnosis of diabetes type II. Patty [name] started Victoza and was having some GI upset and fatigue, but also stopped her B12. She started doing her injection in her thigh and feels that she does better. Her last A1c was 5.9 on 01/21/19 and was well controlled. She has been working on intensive lifestyle modifications including diet, exercise, and weight loss to help control her blood glucose levels.  ASSESSMENT AND PLAN:  Type 2 diabetes mellitus without complication, without long-term current use of insulin (HCC) - Plan: liraglutide (VICTOZA) 18 MG/3ML SOPN  Class 3 severe obesity with serious comorbidity and body mass index (BMI) of 40.0 to 44.9 in adult, unspecified obesity type ([place])  PLAN:  Diabetes II Elvis has been given extensive diabetes education by myself today including ideal fasting and post-prandial blood glucose  readings, individual ideal Hgb A1c goals, and hypoglycemia prevention. We discussed the importance of good blood sugar control to decrease the likelihood of diabetic complications such as nephropathy, neuropathy, limb loss, blindness, coronary artery disease, and death. We discussed the importance of intensive lifestyle modification including diet, exercise, and weight loss as the first line treatment for diabetes. Patty [name] agrees to continue her Victoza 0.1m qAM #3 pens with no refills and she will follow up at the agreed upon time in 4 weeks.  Obesity Patty [name] currently in the action stage of change. As such, her goal is to continue with weight loss efforts. She has agreed to keep a food journal with 1400 to 1600 calories and 85 grams of protein.  SMalekahas been instructed to work up to a goal of 150 minutes of combined cardio and strengthening exercise per week for weight loss and overall health benefits. We discussed the following Behavioral Modification Strategies today: better snacking choices, ways to avoid boredom eating, and ways to avoid night time snacking.  SKrystynehas agreed to follow up with our clinic in 4 weeks. She was informed of the importance of frequent follow up visits to maximize her success with intensive lifestyle modifications for her multiple health conditions.  ALLERGIES: Allergies  Allergen Reactions  . Lasix [Furosemide] Itching    Hypersensitivity   . Onion Other (See Comments)    sereve diarrhea, stomach pains (Crohn's flares)  . Oxycodone Other (See Comments)    Altered mental status  . Potassium-Containing Compounds Other (See  Comments)    Due to chron's, difficult passing though kidneys not allowing absorption in the body   . Prednisone Other (See Comments)    Altered mood  . Sulfa Drugs Cross Reactors Other (See Comments)    "feels like bugs crawling on me"    MEDICATIONS: Current Outpatient Medications on File Prior to Visit  Medication Sig Dispense  Refill  . aspirin EC 81 MG tablet Take 81 mg by mouth daily.    . B Complex-C (B-COMPLEX WITH VITAMIN C) tablet Take 1 tablet by mouth daily.    . Biotin 5000 MCG SUBL Place 5,000 mcg under the tongue daily.    . Cetirizine HCl (ZYRTEC ALLERGY) 10 MG CAPS TAKE 1 TABLET DAILY ALONG WITH THE LASIX 90 capsule 3  . clonazePAM (KLONOPIN) 0.5 MG tablet Take 0.125-0.25 mg by mouth daily as needed (for anxiety or sleep.).    [name] cyanocobalamin (,VITAMIN B-12,) 1000 MCG/ML injection Inject 1,000 mcg into the muscle every 30 (thirty) days. Pernicious anemia    . Cyanocobalamin (VITAMIN B-12) 5000 MCG SUBL Place 5,000 mcg under the tongue daily.    . cyclopentolate (CYCLODRYL,CYCLOGYL) 2 % ophthalmic solution Place 1 drop into both eyes 3 (three) times daily as needed (for uveitis or iritis).    [name] FLUoxetine (PROZAC) 10 MG capsule Take 1 capsule (10 mg total) by mouth daily. 90 capsule 0  . fluticasone (FLONASE) 50 MCG/ACT nasal spray as needed.     . Insulin Pen Needle (BD PEN NEEDLE NANO 2ND GEN) 32G X 4 MM MISC 1 Package by Does not apply route 2 (two) times daily. 100 each 0  . liraglutide (VICTOZA) 18 MG/3ML SOPN Inject 0.1 mLs (0.6 mg total) into the skin every morning. 1 pen 0  . Magnesium 250 MG TABS Take 250 mg by mouth every other day.    . metFORMIN (GLUCOPHAGE) 500 MG tablet Take by mouth.    . Multiple Vitamins-Minerals (ICAPS AREDS 2 PO) Take 1 tablet by mouth every other day.     . Niacinamide-Zinc-Copper-FA (NICOTINAMIDE ZCF PO) Take 1 capsule by mouth daily. Nicotinamide Riboside-Pterostilbene 250-50 mg ([employer])    . OVER THE COUNTER MEDICATION Place 1 each under the tongue daily. 3-4 DROP DAILY (CBD OIL)    . Probiotic Product (PROBIOTIC PO) Take 1 capsule by mouth once a week.    . Tiotropium Bromide Monohydrate (SPIRIVA RESPIMAT) 1.25 MCG/ACT AERS Inhale 2 puffs into the lungs daily. 1 Inhaler 0  . topiramate (TOPAMAX) 50 MG tablet Take 1 tablet (50 mg total)  by mouth daily. 90 tablet 0  . traMADol-acetaminophen (ULTRACET) 37.5-325 MG tablet Take 0.5-1 tablets by mouth every 6 (six) hours as needed (for pain.).     No current facility-administered medications on file prior to visit.     PAST MEDICAL HISTORY: Past Medical History:  Diagnosis Date  . Abnormal nuclear stress test    a. 07/2017: cath with no CAD  . Amyloidosis ([place])   . Anemia   . Crohn disease ([place])   . Crohn's colitis ([place])   . ILD (interstitial lung disease) ([place])   . OSA on CPAP   . Pernicious anemia   . Polycythemia vera ([place])   . Sinus problem     PAST SURGICAL HISTORY: Past Surgical History:  Procedure Laterality Date  . COLON RESECTION    . HIP ARTHROPLASTY    . LEFT HEART CATH AND CORONARY ANGIOGRAPHY N/A 07/28/2017   Procedure: Left Heart Cath  and Coronary Angiography;  Surgeon: Burnell Blanks, MD;  Location: [facility];  Service: Cardiovascular;  Laterality: N/A;    SOCIAL HISTORY: Social History   Tobacco Use  . Smoking status: Former Smoker    Packs/day: 1.00    Years: 45.00    Pack years: 45.00    Types: Cigarettes    Last attempt to quit: 12/26/2008    Years since quitting: 10.3  . Smokeless tobacco: Never Used  Substance Use Topics  . Alcohol use: Yes    Comment: occ  . Drug use: No    FAMILY HISTORY: Family History  Problem Relation Age of Onset  . Glaucoma Mother   . High blood pressure Mother   . High Cholesterol Mother   . Sleep apnea Mother   . Diabetes Mellitus II Father   . Sleep apnea Father   . Anxiety disorder Father   . COPD Brother   . Heart disease Brother   . Non-Hodgkin's lymphoma Brother   . Diabetes Mellitus II Brother   . Glaucoma Brother     ROS: Review of Systems  Constitutional: Positive for malaise/fatigue.  Gastrointestinal:       Positive for bloating.    PHYSICAL EXAM: Pt in no acute distress  RECENT LABS AND TESTS: BMET    Component Value Date/Time   NA 141 01/21/2019 1321    K 4.4 01/21/2019 1321   CL 103 01/21/2019 1321   CO2 23 01/21/2019 1321   GLUCOSE 101 (H) 01/21/2019 1321   GLUCOSE 119 (H) 07/29/2017 0553   BUN 21 01/21/2019 1321   CREATININE 0.75 01/21/2019 1321   CALCIUM 9.4 01/21/2019 1321   GFRNONAA 82 01/21/2019 1321   GFRAA 95 01/21/2019 1321   Lab Results  Component Value Date   HGBA1C 5.9 (H) 01/21/2019   HGBA1C 5.9 (H) 08/07/2018   HGBA1C 6.6 (H) 04/05/2018   Lab Results  Component Value Date   INSULIN 23.6 01/21/2019   INSULIN 16.4 08/07/2018   INSULIN 30.6 (H) 04/05/2018   CBC    Component Value Date/Time   WBC 6.8 01/21/2019 1321   RBC 4.87 01/21/2019 1321   HGB 14.4 01/21/2019 1321   HCT 44.0 01/21/2019 1321   PLT 342 07/25/2017 1544   MCV 90 01/21/2019 1321   MCH 29.6 01/21/2019 1321   MCHC 32.7 01/21/2019 1321   RDW 14.4 01/21/2019 1321   LYMPHSABS 1.6 01/21/2019 1321   EOSABS 0.1 01/21/2019 1321   BASOSABS 0.0 01/21/2019 1321   Iron/TIBC/Ferritin/ %Sat    Component Value Date/Time   IRON 111 04/05/2018 1009   TIBC 311 04/05/2018 1009   FERRITIN 71 04/05/2018 1009   IRONPCTSAT 36 04/05/2018 1009   Lipid Panel     Component Value Date/Time   CHOL 178 01/21/2019 1321   TRIG 74 01/21/2019 1321   HDL 61 01/21/2019 1321   LDLCALC 102 (H) 01/21/2019 1321   Hepatic Function Panel     Component Value Date/Time   PROT 7.0 01/21/2019 1321   ALBUMIN 4.2 01/21/2019 1321   AST 12 01/21/2019 1321   ALT 20 01/21/2019 1321   ALKPHOS 79 01/21/2019 1321   BILITOT 0.5 01/21/2019 1321      Component Value Date/Time   TSH 0.445 (L) 01/21/2019 1321   TSH 0.821 04/05/2018 1009    Results for GIAVONNA, PFLUM (MRN [id number]) as of 04/22/2019 11:57  Ref. Range 01/21/2019 13:21  Vitamin D, 25-Hydroxy Latest Ref Range: 30.0 - 100.0 ng/mL 32.7  IMarcille Blanco, CMA, am acting as transcriptionist for Starlyn Skeans, MD I have reviewed the above documentation for accuracy and completeness, and I agree with the above.  -Dennard Nip, MD

## 2019-05-27 ENCOUNTER — Other Ambulatory Visit: Payer: Self-pay

## 2019-05-27 ENCOUNTER — Ambulatory Visit (INDEPENDENT_AMBULATORY_CARE_PROVIDER_SITE_OTHER): Payer: Medicare Other | Admitting: Family Medicine

## 2019-05-27 ENCOUNTER — Encounter (INDEPENDENT_AMBULATORY_CARE_PROVIDER_SITE_OTHER): Payer: Self-pay | Admitting: Family Medicine

## 2019-05-27 DIAGNOSIS — E119 Type 2 diabetes mellitus without complications: Secondary | ICD-10-CM

## 2019-05-27 DIAGNOSIS — Z6841 Body Mass Index (BMI) 40.0 and over, adult: Secondary | ICD-10-CM

## 2019-05-27 MED ORDER — BLOOD GLUCOSE MONITOR KIT: PACK | 0 refills | Status: DC

## 2019-05-27 NOTE — Progress Notes (Signed)
Office: [phone]  /  Fax: [phone] TeleHealth Visit:  Patty [name] has verbally consented to this TeleHealth visit today. The patient is located at home, the provider is located at the [employer] [facility] office. The participants in this visit include the listed provider and patient. Patty [name] was unable to use realtime audiovisual technology today and the telehealth visit was conducted via telephone.   HPI:   Chief Complaint: OBESITY Patty [name] is here to discuss her progress with her obesity treatment plan. She is on the keep a food journal with 1400-1600 calories and 85 grams of protein daily and is following her eating plan approximately 80 % of the time. She states she is doing some walking and yard work. Patty [name] continue to work on [employer], trade [employer] and is doing well with maintaining her weight. She feels her hunger is better controlled overall.  We were unable to weigh the patient today for this TeleHealth visit. She feels as if she has lost weight since her last visit. She has lost 24 lbs since starting treatment with [name].  Diabetes II Patty [name] has a diagnosis of diabetes type II. Patty [name] started on Victoza at 0.6 mg and had increased GI upset, and had a Crohn's flare-up. She stopped her Victoza, but her GI symptoms have not improved. She states she is not checking BGs at home and denies hypoglycemia. Last A1c was 5.9. She has been working on intensive lifestyle modifications including diet, exercise, and weight loss to help control her blood glucose levels.  ASSESSMENT AND PLAN:  Type 2 diabetes mellitus without complication, without long-term current use of insulin ([place]) - Plan: blood glucose meter kit and supplies KIT  Class 3 severe obesity with serious comorbidity and body mass index (BMI) of 40.0 to 44.9 in adult, unspecified obesity type (HCC)  PLAN:  Diabetes II Patty [name] has been given extensive diabetes education by myself today including ideal fasting and post-prandial blood  glucose readings, individual ideal Hgb A1c goals and hypoglycemia prevention. We discussed the importance of good blood sugar control to decrease the likelihood of diabetic complications such as nephropathy, neuropathy, limb loss, blindness, coronary artery disease, and death. We discussed the importance of intensive lifestyle modification including diet, exercise and weight loss as the first line treatment for diabetes. Patty [name] agrees to discontinue Victoza and continue with diet. We will send glucometer kit to her pharmacy. Patty [name] agrees to follow up with our clinic in 5 weeks.  I spent > than 50% of the 25 minute visit on counseling as documented in the note.  Obesity Patty [name] is currently in the action stage of change. As such, her goal is to continue with weight loss efforts She has agreed to keep a food journal with 1400-1600 calories and 85 grams of protein daily Patty [name] has been instructed to work up to a goal of 150 minutes of combined cardio and strengthening exercise per week for weight loss and overall health benefits. We discussed the following Behavioral Modification Strategies today: work on meal planning and easy cooking plans, no skipping meals, and ways to avoid night time snacking   Patty [name] has agreed to follow up with our clinic in 5 weeks. She was informed of the importance of frequent follow up visits to maximize her success with intensive lifestyle modifications for her multiple health conditions.  ALLERGIES: Allergies  Allergen Reactions   Lasix [Furosemide] Itching    Hypersensitivity    Onion Other (See Comments)    sereve diarrhea, stomach pains (Crohn's flares)   Oxycodone  Other (See Comments)    Altered mental status   Potassium-Containing Compounds Other (See Comments)    Due to chron's, difficult passing though kidneys not allowing absorption in the body    Prednisone Other (See Comments)    Altered mood   Sulfa Drugs Cross Reactors Other (See Comments)     "feels like bugs crawling on me"    MEDICATIONS: Current Outpatient Medications on File Prior to Visit  Medication Sig Dispense Refill   aspirin EC 81 MG tablet Take 81 mg by mouth daily.     B Complex-C (B-COMPLEX WITH VITAMIN C) tablet Take 1 tablet by mouth daily.     Biotin 5000 MCG SUBL Place 5,000 mcg under the tongue daily.     Cetirizine HCl (ZYRTEC ALLERGY) 10 MG CAPS TAKE 1 TABLET DAILY ALONG WITH THE LASIX 90 capsule 3   clonazePAM (KLONOPIN) 0.5 MG tablet Take 0.125-0.25 mg by mouth daily as needed (for anxiety or sleep.).     cyanocobalamin (,VITAMIN B-12,) 1000 MCG/ML injection Inject 1,000 mcg into the muscle every 30 (thirty) days. Pernicious anemia     Cyanocobalamin (VITAMIN B-12) 5000 MCG SUBL Place 5,000 mcg under the tongue daily.     cyclopentolate (CYCLODRYL,CYCLOGYL) 2 % ophthalmic solution Place 1 drop into both eyes 3 (three) times daily as needed (for uveitis or iritis).     FLUoxetine (PROZAC) 10 MG capsule Take 1 capsule (10 mg total) by mouth daily. 90 capsule 0   fluticasone (FLONASE) 50 MCG/ACT nasal spray as needed.      Magnesium 250 MG TABS Take 250 mg by mouth every other day.     metFORMIN (GLUCOPHAGE) 500 MG tablet Take by mouth.     Multiple Vitamins-Minerals (ICAPS AREDS 2 PO) Take 1 tablet by mouth every other day.      Niacinamide-Zinc-Copper-FA (NICOTINAMIDE ZCF PO) Take 1 capsule by mouth daily. Nicotinamide Riboside-Pterostilbene 250-50 mg ([employer])     OVER THE COUNTER MEDICATION Place 1 each under the tongue daily. 3-4 DROP DAILY (CBD OIL)     Probiotic Product (PROBIOTIC PO) Take 1 capsule by mouth once a week.     Tiotropium Bromide Monohydrate (SPIRIVA RESPIMAT) 1.25 MCG/ACT AERS Inhale 2 puffs into the lungs daily. 1 Inhaler 0   topiramate (TOPAMAX) 50 MG tablet Take 1 tablet (50 mg total) by mouth daily. 90 tablet 0   traMADol-acetaminophen (ULTRACET) 37.5-325 MG tablet Take 0.5-1 tablets by  mouth every 6 (six) hours as needed (for pain.).     No current facility-administered medications on file prior to visit.     PAST MEDICAL HISTORY: Past Medical History:  Diagnosis Date   Abnormal nuclear stress test    a. 07/2017: cath with no CAD   Amyloidosis ([place])    Anemia    Crohn disease ([place])    Crohn's colitis ([place])    ILD (interstitial lung disease) ([place])    OSA on CPAP    Pernicious anemia    Polycythemia vera ([place])    Sinus problem     PAST SURGICAL HISTORY: Past Surgical History:  Procedure Laterality Date   COLON RESECTION     HIP ARTHROPLASTY     LEFT HEART CATH AND CORONARY ANGIOGRAPHY N/A 07/28/2017   Procedure: Left Heart Cath and Coronary Angiography;  Surgeon: Burnell Blanks, MD;  Location: [facility];  Service: Cardiovascular;  Laterality: N/A;    SOCIAL HISTORY: Social History   Tobacco Use   Smoking status:  Former Smoker    Packs/day: 1.00    Years: 45.00    Pack years: 45.00    Types: Cigarettes    Last attempt to quit: 12/26/2008    Years since quitting: 10.4   Smokeless tobacco: Never Used  Substance Use Topics   Alcohol use: Yes    Comment: occ   Drug use: No    FAMILY HISTORY: Family History  Problem Relation Age of Onset   Glaucoma Mother    High blood pressure Mother    High Cholesterol Mother    Sleep apnea Mother    Diabetes Mellitus II Father    Sleep apnea Father    Anxiety disorder Father    COPD Brother    Heart disease Brother    Non-Hodgkin's lymphoma Brother    Diabetes Mellitus II Brother    Glaucoma Brother     ROS: Review of Systems  Constitutional: Positive for weight loss.  Endo/Heme/Allergies:       Negative hypoglycemia    PHYSICAL EXAM: Pt in no acute distress  RECENT LABS AND TESTS: BMET    Component Value Date/Time   NA 141 01/21/2019 1321   K 4.4 01/21/2019 1321   CL 103 01/21/2019 1321   CO2 23 01/21/2019 1321   GLUCOSE 101 (H) 01/21/2019 1321     GLUCOSE 119 (H) 07/29/2017 0553   BUN 21 01/21/2019 1321   CREATININE 0.75 01/21/2019 1321   CALCIUM 9.4 01/21/2019 1321   GFRNONAA 82 01/21/2019 1321   GFRAA 95 01/21/2019 1321   Lab Results  Component Value Date   HGBA1C 5.9 (H) 01/21/2019   HGBA1C 5.9 (H) 08/07/2018   HGBA1C 6.6 (H) 04/05/2018   Lab Results  Component Value Date   INSULIN 23.6 01/21/2019   INSULIN 16.4 08/07/2018   INSULIN 30.6 (H) 04/05/2018   CBC    Component Value Date/Time   WBC 6.8 01/21/2019 1321   RBC 4.87 01/21/2019 1321   HGB 14.4 01/21/2019 1321   HCT 44.0 01/21/2019 1321   PLT 342 07/25/2017 1544   MCV 90 01/21/2019 1321   MCH 29.6 01/21/2019 1321   MCHC 32.7 01/21/2019 1321   RDW 14.4 01/21/2019 1321   LYMPHSABS 1.6 01/21/2019 1321   EOSABS 0.1 01/21/2019 1321   BASOSABS 0.0 01/21/2019 1321   Iron/TIBC/Ferritin/ %Sat    Component Value Date/Time   IRON 111 04/05/2018 1009   TIBC 311 04/05/2018 1009   FERRITIN 71 04/05/2018 1009   IRONPCTSAT 36 04/05/2018 1009   Lipid Panel     Component Value Date/Time   CHOL 178 01/21/2019 1321   TRIG 74 01/21/2019 1321   HDL 61 01/21/2019 1321   LDLCALC 102 (H) 01/21/2019 1321   Hepatic Function Panel     Component Value Date/Time   PROT 7.0 01/21/2019 1321   ALBUMIN 4.2 01/21/2019 1321   AST 12 01/21/2019 1321   ALT 20 01/21/2019 1321   ALKPHOS 79 01/21/2019 1321   BILITOT 0.5 01/21/2019 1321      Component Value Date/Time   TSH 0.445 (L) 01/21/2019 1321   TSH 0.821 04/05/2018 1009      I, Trixie Dredge, am acting as Location manager for Dennard Nip, MD I have reviewed the above documentation for accuracy and completeness, and I agree with the above. -Dennard Nip, MD

## 2019-05-28 ENCOUNTER — Encounter (INDEPENDENT_AMBULATORY_CARE_PROVIDER_SITE_OTHER): Payer: Self-pay | Admitting: Family Medicine

## 2019-05-28 ENCOUNTER — Other Ambulatory Visit (INDEPENDENT_AMBULATORY_CARE_PROVIDER_SITE_OTHER): Payer: Self-pay

## 2019-05-28 DIAGNOSIS — E119 Type 2 diabetes mellitus without complications: Secondary | ICD-10-CM

## 2019-05-28 MED ORDER — BLOOD GLUCOSE MONITOR KIT: PACK | 0 refills | Status: DC

## 2019-06-03 ENCOUNTER — Ambulatory Visit: Payer: Medicare Other | Admitting: Family Medicine

## 2019-06-18 ENCOUNTER — Encounter (INDEPENDENT_AMBULATORY_CARE_PROVIDER_SITE_OTHER): Payer: Self-pay

## 2019-06-19 ENCOUNTER — Encounter (INDEPENDENT_AMBULATORY_CARE_PROVIDER_SITE_OTHER): Payer: Self-pay | Admitting: Family Medicine

## 2019-06-19 NOTE — Telephone Encounter (Signed)
Forwarding due to you working at home, please advise. Patty [name] [facility]

## 2019-06-19 NOTE — Telephone Encounter (Signed)
Need the refill request form filled out first

## 2019-06-24 ENCOUNTER — Encounter: Payer: Self-pay | Admitting: *Deleted

## 2019-06-24 ENCOUNTER — Encounter (INDEPENDENT_AMBULATORY_CARE_PROVIDER_SITE_OTHER): Payer: Self-pay | Admitting: Family Medicine

## 2019-06-25 ENCOUNTER — Ambulatory Visit (INDEPENDENT_AMBULATORY_CARE_PROVIDER_SITE_OTHER): Payer: Medicare Other | Admitting: Internal Medicine

## 2019-06-25 ENCOUNTER — Encounter: Payer: Self-pay | Admitting: Internal Medicine

## 2019-06-25 ENCOUNTER — Other Ambulatory Visit (INDEPENDENT_AMBULATORY_CARE_PROVIDER_SITE_OTHER): Payer: Self-pay

## 2019-06-25 VITALS — Ht 64.0 in | Wt 227.0 lb

## 2019-06-25 DIAGNOSIS — K50818 Crohn's disease of both small and large intestine with other complication: Secondary | ICD-10-CM

## 2019-06-25 DIAGNOSIS — F3289 Other specified depressive episodes: Secondary | ICD-10-CM

## 2019-06-25 DIAGNOSIS — R194 Change in bowel habit: Secondary | ICD-10-CM

## 2019-06-25 DIAGNOSIS — R195 Other fecal abnormalities: Secondary | ICD-10-CM | POA: Diagnosis not present

## 2019-06-25 MED ORDER — TOPIRAMATE 50 MG PO TABS: 50.0000 mg | ORAL_TABLET | Freq: Every day | ORAL | 0 refills | Status: DC

## 2019-06-25 MED ORDER — FLUOXETINE HCL 10 MG PO CAPS: 10.0000 mg | ORAL_CAPSULE | Freq: Every day | ORAL | 0 refills | Status: DC

## 2019-06-25 NOTE — Progress Notes (Signed)
Patient ID: Patty [name], female   DOB: Oct 20, 1951, 68 y.o.   MRN: [id number]   This service was provided via telemedicine.  [employer] APP with A/V communication The patient was located at home The provider was located in provider's GI office. The patient did consent to this telephone visit and is aware of possible charges through their insurance for this visit.   The persons participating in this telemedicine service were the patient and I. Time spent on call: 38 minutes   HPI: Patty [name] is a 68 year old female with a history of small bowel and colonic Crohn's disease with history of prior small bowel fistula, colonic stricture status post partial colonic resection in 1993, history of uveitis/iritis, history of ILD diagnosed in May 2018 on oxygen with walking distances over 250 feet, pernicious anemia, polycythemia vera, type 2 diabetes, obesity, sleep apnea who is seen in consult to discuss her Crohn's disease and also diarrhea.  She is seen virtually today through the [employer] app with A/V communication.  She reports that she was diagnosed with ulcerative colitis around age 66.  She recalls being hospitalized around age 45 and age 65.  The disease seemed to stabilize and then around age 79 while in [place] she was diagnosed with Crohn's disease by Dr. Tessa Lerner.  She recalls having small bowel Fistulizing disease and then developed a colonic narrowing/stricture in 1993 where she had a partial colectomy.  She was told she has about "1 foot of her colon left".  She has not had specific Crohn's management since that surgery but does have a prescription for Pentasa which she will take occasionally when she feels a flare of her Crohn's disease.  She uses 1000 mg/day in divided doses when she takes it but she takes it rarely.  Her last colonoscopy was in 1993 but she recalls a flexible sigmoidoscopy maybe 10 years ago but perhaps longer.  She does not have flares of her colitis often but if she does  she describes diarrhea, occasional crampy abdominal pain and swelling and soreness of a perianal skin tag with occasional bleeding.  She uses Proctosol for that.  She reports she has had complications of uveitis and iritis along with nasal sinusitis periodically.  Most recently particularly after starting metformin for her diabetes she had definitive diarrhea, fatigue and moodiness.  She tried to half the dose but symptoms persisted.  This was dose titrated over 3 to 4 months and eventually changed to Victoza.  Victoza also cause diarrhea and abdominal bloating and it has been stopped about a month ago.  With this her bowel movements have become more normal though not back to what she was used to.  She is no longer having diarrhea.  She is not having rectal bleeding.  Stools can be small and pellet-like but also wormlike.  She sees Dr. Chase Caller for her ILD, healthy weight and wellness Dr. Leafy Ro.  There is no known family history of IBD.  No family history of colon cancer.  The only malignancy known in her family is a brother who died of non-Hodgkin's lymphoma after being treated as a child for aplastic anemia.  Past Medical History:  Diagnosis Date  . Abnormal nuclear stress test    a. 07/2017: cath with no CAD  . Anemia   . Crohn disease ([place])   . Crohn's colitis ([place])   . ILD (interstitial lung disease) ([place])   . OSA on CPAP   . Pernicious anemia   . Polycythemia vera ([place])   .  Sinus problem     Past Surgical History:  Procedure Laterality Date  . COLON RESECTION    . HIP ARTHROPLASTY Right    x 2  . LEFT HEART CATH AND CORONARY ANGIOGRAPHY N/A 07/28/2017   Procedure: Left Heart Cath and Coronary Angiography;  Surgeon: Burnell Blanks, MD;  Location: [facility];  Service: Cardiovascular;  Laterality: N/A;    Outpatient Medications Prior to Visit  Medication Sig Dispense Refill  . aspirin EC 81 MG tablet Take 81 mg by mouth daily.    . Biotin 5000 MCG SUBL Place  5,000 mcg under the tongue daily.    . blood glucose meter kit and supplies KIT Dispense based on patient and insurance preference. Use up to twice daily as directed. (FOR ICD-9 250.00, 250.01). 1 each 0  . Cetirizine HCl (ZYRTEC ALLERGY) 10 MG CAPS TAKE 1 TABLET DAILY ALONG WITH THE LASIX (Patient taking differently: Take 1 capsule by mouth daily as needed. ) 90 capsule 3  . clonazePAM (KLONOPIN) 0.5 MG tablet Take 0.25-0.5 mg by mouth daily as needed (for anxiety or sleep.).     [name] cyclopentolate (CYCLODRYL,CYCLOGYL) 2 % ophthalmic solution Place 1 drop into both eyes 3 (three) times daily as needed (for uveitis or iritis).    [name] FLUoxetine (PROZAC) 10 MG capsule Take 1 capsule (10 mg total) by mouth daily. 90 capsule 0  . fluticasone (FLONASE) 50 MCG/ACT nasal spray as needed.     . mesalamine (PENTASA) 250 MG CR capsule Take 500 mg by mouth. 2-4 times daily as needed    . OVER THE COUNTER MEDICATION Place 1 each under the tongue as needed.     . Probiotic Product (PROBIOTIC PO) Take 1 capsule by mouth once a week.    . topiramate (TOPAMAX) 50 MG tablet Take 1 tablet (50 mg total) by mouth daily. (Patient taking differently: Take 25 mg by mouth daily as needed. ) 90 tablet 0  . traMADol-acetaminophen (ULTRACET) 37.5-325 MG tablet Take 0.5-1 tablets by mouth every 6 (six) hours as needed (for pain.).     No facility-administered medications prior to visit.     Allergies  Allergen Reactions  . Lasix [Furosemide] Itching    Hypersensitivity   . Onion Other (See Comments)    sereve diarrhea, stomach pains (Crohn's flares)  . Oxycodone Other (See Comments)    Altered mental status  . Potassium-Containing Compounds Other (See Comments)    Due to chron's, difficult passing though kidneys not allowing absorption in the body   . Prednisone Other (See Comments)    Altered mood  . Sulfa Drugs Cross Reactors Other (See Comments)    "feels like bugs crawling on me"    Family History  Problem  Relation Age of Onset  . Glaucoma Mother   . High blood pressure Mother   . High Cholesterol Mother   . Sleep apnea Mother   . Diabetes Mellitus II Father   . Sleep apnea Father   . Anxiety disorder Father   . COPD Brother   . Heart disease Brother   . Non-Hodgkin's lymphoma Brother   . Diabetes Mellitus II Brother   . Glaucoma Brother   . Colon cancer Neg Hx   . Esophageal cancer Neg Hx   . Pancreatic cancer Neg Hx   . Stomach cancer Neg Hx   . Liver disease Neg Hx     Social History   Tobacco Use  . Smoking status: Former Smoker  Packs/day: 1.00    Years: 45.00    Pack years: 45.00    Types: Cigarettes    Quit date: 12/26/2008    Years since quitting: 10.5  . Smokeless tobacco: Never Used  Substance Use Topics  . Alcohol use: Yes    Comment: occ  . Drug use: No    ROS: As per history of present illness, otherwise negative  Ht 5' 4" (1.626 m)   Wt 227 lb (103 kg)   BMI 38.96 kg/m  No PE, virtual visit  RELEVANT LABS AND IMAGING: CBC    Component Value Date/Time   WBC 6.8 01/21/2019 1321   RBC 4.87 01/21/2019 1321   HGB 14.4 01/21/2019 1321   HCT 44.0 01/21/2019 1321   PLT 342 07/25/2017 1544   MCV 90 01/21/2019 1321   MCH 29.6 01/21/2019 1321   MCHC 32.7 01/21/2019 1321   RDW 14.4 01/21/2019 1321   LYMPHSABS 1.6 01/21/2019 1321   EOSABS 0.1 01/21/2019 1321   BASOSABS 0.0 01/21/2019 1321    CMP     Component Value Date/Time   NA 141 01/21/2019 1321   K 4.4 01/21/2019 1321   CL 103 01/21/2019 1321   CO2 23 01/21/2019 1321   GLUCOSE 101 (H) 01/21/2019 1321   GLUCOSE 119 (H) 07/29/2017 0553   BUN 21 01/21/2019 1321   CREATININE 0.75 01/21/2019 1321   CALCIUM 9.4 01/21/2019 1321   PROT 7.0 01/21/2019 1321   ALBUMIN 4.2 01/21/2019 1321   AST 12 01/21/2019 1321   ALT 20 01/21/2019 1321   ALKPHOS 79 01/21/2019 1321   BILITOT 0.5 01/21/2019 1321   GFRNONAA 82 01/21/2019 1321   GFRAA 95 01/21/2019 1321    ASSESSMENT/PLAN: 68 year old  female with a history of small bowel and colonic Crohn's disease with history of prior small bowel fistula, colonic stricture status post partial colonic resection in 1993, history of uveitis/iritis, history of ILD diagnosed in May 2018 on oxygen with walking distances over 250 feet, pernicious anemia, polycythemia vera, type 2 diabetes, obesity, sleep apnea who is seen in consult to discuss her Crohn's disease and also diarrhea.   1.  Crohn's disease of the small bowel and colon/history of partial colectomy and history of small bowel fistula disease and stricturing disease of the colon --she has done remarkably well after her partial colectomy in 1993.  She is intermittently used Pentasa but not been on any maintenance therapy on a regular basis.  We discussed how the treatment of IBD has changed dramatically since her diagnosis and last intervention in 1993.  At this point I am not convinced that she needs any maintenance therapy but certainly we need to assess disease activity and perform a screening colonoscopy.  We discussed how the risk of colon cancer is increased in patients with had colonic Crohn's disease for longer than 8 years.  She certainly meets this definition.  Given her history of small bowel disease I recommended we assess with cross-sectional imaging as well.  Her bowel habit changes more likely related to diabetes medicine but I cannot exclude active IBD.  I recommended the following: --CT enterography; history of small and large bowel Crohn's disease evaluate for disease activity rule out stricture --Colonoscopy for screening and to assess disease activity; this will need to be performed in the outpatient hospital setting due to her oxygen use on occasion.  Please schedule for [place] [facility] with monitored anesthesia care on 08/12/2019 for a late morning appointment.  We discussed the risk, benefits  and alternatives and she is agreeable and wishes to proceed. --I asked that she begin Benefiber  tablespoon a day to try to help even out bowel movements, bulk stool and aid with regularity --She can continue the Pentasa on an as-needed basis at 500 mg twice daily though I am not sure that this is helping particularly with intermittent use.  Also 5-ASA therapy for Crohn's disease has marginal benefit for most people. --She can use proctozone zone HC 2.5% twice daily to the perianal skin on an as-needed basis.  She should avoid chronic daily use due to the risk of atrophy.

## 2019-06-26 MED ORDER — HYDROCORTISONE (PERIANAL) 2.5 % EX CREA: 1.0000 "application " | TOPICAL_CREAM | Freq: Two times a day (BID) | CUTANEOUS | 1 refills | Status: DC | PRN

## 2019-06-26 MED ORDER — SUPREP BOWEL PREP KIT 17.5-3.13-1.6 GM/177ML PO SOLN: 1.0000 | ORAL | 0 refills | Status: DC

## 2019-06-26 NOTE — Patient Instructions (Addendum)
Please purchase the following medications over the counter and take as directed: Benefiber-1 tablespoon a day  You can continue the Pentasa on an as-needed basis at 500 mg twice daily though I am not sure that this is helping particularly with intermittent use.  Also, 5-ASA therapy for Crohn's disease has marginal benefit for most people.  You may use proctozone HC 2.5% twice daily to the perianal skin on an as-needed basis.  She should avoid chronic daily use due to the risk of atrophy.   Your provider has requested that you go to the basement level for lab work at least 1 day prior to having your CT enterography. Press "B" on the elevator. The lab is located at the first door on the left as you exit the elevator.  You have been scheduled for a CT enterography scan of the abdomen and pelvis at [facility] ([address]---this is in the same building as [employer]).    You are scheduled on 07/10/2019 at 10:45 am. You should arrive at 9:15 am for registration and preparation for your test. Please follow the written instructions below on the day of your exam:  WARNING: IF YOU ARE ALLERGIC TO IODINE/X-RAY DYE, PLEASE NOTIFY RADIOLOGY IMMEDIATELY AT [phone]! YOU WILL BE GIVEN A 13 HOUR PREMEDICATION PREP.  1) Do not eat or drink anything after 6:45 am (4 hours prior to your test)  You may take any medications as prescribed with a small amount of water, if necessary. If you take any of the following medications: METFORMIN, GLUCOPHAGE, GLUCOVANCE, AVANDAMET, RIOMET, FORTAMET, ACTOPLUS MET, JANUMET, GLUMETZA or METAGLIP, you MAY be asked to HOLD this medication 48 hours AFTER the exam.  This test typically takes 30-45 minutes to complete.  If you have any questions regarding your exam or if you need to reschedule, you may call the CT department at [phone] between the hours of 8:00 am and 5:00 pm,  Monday-Friday.  ________________________________________________________________________ Dennis Bast have been scheduled for an endoscopy and colonoscopy. Please follow the written instructions given to you at your visit today. Please pick up your prep supplies at the pharmacy within the next 1-3 days. If you use inhalers (even only as needed), please bring them with you on the day of your procedure. -------------------------------------------------------------------------------------------------------------------  Due to recent COVID-19 restrictions implemented by our local and state authorities and in an effort to keep both patients and staff as safe as possible, our hospital system now requires COVID-19 testing prior to any scheduled hospital procedure. Please go to our [facility] location drive thru testing site (this is directly across from our office but on GPS will read an address of  [place], [place] [address]) on 08/08/2019, any time between 11 am - 3 pm. You will not be billed at the time of testing but may receive a bill later depending on your insurance. The approximate cost of the test is $100. You must agree to /quarantine from the time of your testing until the procedure date on 08/12/2019 . This should include staying at home with ONLY the people you live with. Avoid take-out, grocery store shopping or leaving the house for any non-emergent reason. Please call our office at [phone] if you have any questions.

## 2019-06-26 NOTE — Addendum Note (Signed)
Addended by: Larina Bras on: 06/26/2019 12:48 PM   Modules accepted: Orders, SmartSet

## 2019-06-26 NOTE — Addendum Note (Signed)
Addended by: Larina Bras on: 06/26/2019 03:37 PM   Modules accepted: Orders

## 2019-06-27 ENCOUNTER — Telehealth: Payer: Self-pay | Admitting: Internal Medicine

## 2019-06-27 NOTE — Telephone Encounter (Signed)
Patient called said she is returning your call. She said that if it was regarding her two appts you scheduled for the ct scan and the covid testing she said that they work for her. She saw it on my Chat.   If you have any additional information to call her back.

## 2019-06-27 NOTE — Telephone Encounter (Signed)
I have gone over instructions for CT enterography, COVID-19 testing, Endo/colon, labwork time/date/prep in great detail with patient. I explained that she will be getting written instructions in the mail and is free to give [name] a call with additional questions. Patient verbalizes understanding of all instructions and the fact that she will need a care partner 29 or older to bring her for procedure and take her home.

## 2019-07-02 ENCOUNTER — Ambulatory Visit (INDEPENDENT_AMBULATORY_CARE_PROVIDER_SITE_OTHER): Payer: Medicare Other | Admitting: Family Medicine

## 2019-07-03 DIAGNOSIS — H35013 Changes in retinal vascular appearance, bilateral: Secondary | ICD-10-CM | POA: Diagnosis not present

## 2019-07-03 DIAGNOSIS — H40013 Open angle with borderline findings, low risk, bilateral: Secondary | ICD-10-CM | POA: Diagnosis not present

## 2019-07-03 DIAGNOSIS — H353132 Nonexudative age-related macular degeneration, bilateral, intermediate dry stage: Secondary | ICD-10-CM | POA: Diagnosis not present

## 2019-07-03 DIAGNOSIS — H43813 Vitreous degeneration, bilateral: Secondary | ICD-10-CM | POA: Diagnosis not present

## 2019-07-03 LAB — HM DIABETES EYE EXAM

## 2019-07-04 ENCOUNTER — Encounter (INDEPENDENT_AMBULATORY_CARE_PROVIDER_SITE_OTHER): Payer: Self-pay | Admitting: Family Medicine

## 2019-07-04 ENCOUNTER — Telehealth (INDEPENDENT_AMBULATORY_CARE_PROVIDER_SITE_OTHER): Payer: Medicare Other | Admitting: Family Medicine

## 2019-07-04 ENCOUNTER — Telehealth: Payer: Self-pay | Admitting: Internal Medicine

## 2019-07-04 ENCOUNTER — Other Ambulatory Visit: Payer: Self-pay

## 2019-07-04 DIAGNOSIS — Z6841 Body Mass Index (BMI) 40.0 and over, adult: Secondary | ICD-10-CM

## 2019-07-04 DIAGNOSIS — E119 Type 2 diabetes mellitus without complications: Secondary | ICD-10-CM | POA: Diagnosis not present

## 2019-07-04 NOTE — Telephone Encounter (Signed)
Patient call wanting to reschedule appointment that is schedule in hospital

## 2019-07-05 NOTE — Telephone Encounter (Signed)
Pt called and needs to reschedule her CT scan and her hospital procedure due to insurance issues. Pt given the phone number to [facility] to move her appt to a date that works for her. Pts hospital appt cancelled.   Dottie do you have Dr. Vena Rua next hospital date?

## 2019-07-08 NOTE — Telephone Encounter (Signed)
I do not know Dr Vena Rua next hospital date. I do not think it has been released yet.

## 2019-07-08 NOTE — Progress Notes (Signed)
Office: [phone]  /  Fax: [phone] TeleHealth Visit:  Patty [name] has verbally consented to this TeleHealth visit today. The patient is located at home, the provider is located at the [facility] office. The participants in this visit include the listed provider and patient. The visit was conducted today via facetime.  HPI:   Chief Complaint: OBESITY Patty [name] is here to discuss her progress with her obesity treatment plan. She is on the  keep a food journal with 1400-1600 calories and 85g of protein  daily and is following her eating plan approximately 0 % of the time. She states she is exercising 0 minutes 0 times per week. Patty [name] is not journaling much, she is mostly doing portion control and smarter choices. She thinks she has maintained her weight but is not sure. She has been having problems with her Crohns disease and concentrating on dealing with that more than anything.  We were unable to weigh the patient today for this TeleHealth visit. She feels as if she has maintained weight since her last visit. She has lost 24 lbs since starting treatment with [name].  Diabetes II [name] has a diagnosis of diabetes type II. Patty [name] states fatsing BGs range between 115 and 120 over last 2 weeks and denies any hypoglycemic episodes. Last A1c was 5.9.  She has been working on intensive lifestyle modifications including diet, exercise, and weight loss to help control her blood glucose levels. She is not currently taking any medications and does not feel she needs them.   ASSESSMENT AND PLAN:  Type 2 diabetes mellitus without complication, without long-term current use of insulin (HCC)  Class 3 severe obesity with serious comorbidity and body mass index (BMI) of 40.0 to 44.9 in adult, unspecified obesity type ([place])  PLAN: Diabetes II Patty [name] has been given extensive diabetes education by myself today including ideal fasting and post-prandial blood glucose readings, individual ideal  HgA1c goals  and hypoglycemia prevention. We discussed the importance of good blood sugar control to decrease the likelihood of diabetic complications such as nephropathy, neuropathy, limb loss, blindness, coronary artery disease, and death. We discussed the importance of intensive lifestyle modification including diet, exercise and weight loss as the first line treatment for diabetes. She will follow up with PCP.  [name] agrees to continue her diabetes medications and will follow up at the agreed upon time.  I spent > than 50% of the 25 minute visit on counseling as documented in the note.  Obesity Patty [name] is not currently in the action stage of change. As such, her goal is to maintain weight for now. She plans on dealing with Crohns Disease for now and will reassess her readiness in 2-3 months.  She has agreed to portion control better and make smarter food choices, such as increase vegetables and decrease simple carbohydrates  [name] has been instructed to work up to a goal of 150 minutes of combined cardio and strengthening exercise per week for weight loss and overall health benefits. We discussed the following Behavioral Modification Stratagies today: keeping healthy foods in the home,  work on meal planning and easy cooking plans and ways to avoid boredom eating.    Patty [name] has agreed to follow up with our clinic in 2-3 months. She was informed of the importance of frequent follow up visits to maximize her success with intensive lifestyle modifications for her multiple health conditions.  ALLERGIES: Allergies  Allergen Reactions  . Lasix [Furosemide] Itching    Hypersensitivity   .  Onion Other (See Comments)    sereve diarrhea, stomach pains (Crohn's flares)  . Oxycodone Other (See Comments)    Altered mental status  . Potassium-Containing Compounds Other (See Comments)    Due to chron's, difficult passing though kidneys not allowing absorption in the body   . Prednisone Other (See  Comments)    Altered mood  . Sulfa Drugs Cross Reactors Other (See Comments)    "feels like bugs crawling on me"    MEDICATIONS: Current Outpatient Medications on File Prior to Visit  Medication Sig Dispense Refill  . aspirin EC 81 MG tablet Take 81 mg by mouth daily.    . Biotin 5000 MCG SUBL Place 5,000 mcg under the tongue daily.    . blood glucose meter kit and supplies KIT Dispense based on patient and insurance preference. Use up to twice daily as directed. (FOR ICD-9 250.00, 250.01). 1 each 0  . Cetirizine HCl (ZYRTEC ALLERGY) 10 MG CAPS TAKE 1 TABLET DAILY ALONG WITH THE LASIX (Patient taking differently: Take 1 capsule by mouth daily as needed. ) 90 capsule 3  . clonazePAM (KLONOPIN) 0.5 MG tablet Take 0.25-0.5 mg by mouth daily as needed (for anxiety or sleep.).     [name] cyclopentolate (CYCLODRYL,CYCLOGYL) 2 % ophthalmic solution Place 1 drop into both eyes 3 (three) times daily as needed (for uveitis or iritis).    [name] FLUoxetine (PROZAC) 10 MG capsule Take 1 capsule (10 mg total) by mouth daily. 90 capsule 0  . fluticasone (FLONASE) 50 MCG/ACT nasal spray as needed.     . hydrocortisone (PROCTOZONE-HC) 2.5 % rectal cream Place 1 application rectally 2 (two) times daily as needed for hemorrhoids or anal itching. 30 g 1  . mesalamine (PENTASA) 250 MG CR capsule Take 500 mg by mouth. 2-4 times daily as needed    . OVER THE COUNTER MEDICATION Place 1 each under the tongue as needed.     . Probiotic Product (PROBIOTIC PO) Take 1 capsule by mouth once a week.    Manus Gunning BOWEL PREP KIT 17.5-3.13-1.6 GM/177ML SOLN Take 1 kit by mouth as directed. For colonoscopy prep 354 mL 0  . topiramate (TOPAMAX) 50 MG tablet Take 1 tablet (50 mg total) by mouth daily. 90 tablet 0  . traMADol-acetaminophen (ULTRACET) 37.5-325 MG tablet Take 0.5-1 tablets by mouth every 6 (six) hours as needed (for pain.).     No current facility-administered medications on file prior to visit.     PAST MEDICAL HISTORY:  Past Medical History:  Diagnosis Date  . Abnormal nuclear stress test    a. 07/2017: cath with no CAD  . Amyloidosis ([place])   . Anemia   . Crohn disease ([place])   . Crohn's colitis ([place])   . ILD (interstitial lung disease) ([place])   . OSA on CPAP   . Pernicious anemia   . Polycythemia vera ([place])   . Sinus problem     PAST SURGICAL HISTORY: Past Surgical History:  Procedure Laterality Date  . COLON RESECTION    . HIP ARTHROPLASTY Right    x 2  . LEFT HEART CATH AND CORONARY ANGIOGRAPHY N/A 07/28/2017   Procedure: Left Heart Cath and Coronary Angiography;  Surgeon: Burnell Blanks, MD;  Location: [facility];  Service: Cardiovascular;  Laterality: N/A;    SOCIAL HISTORY: Social History   Tobacco Use  . Smoking status: Former Smoker    Packs/day: 1.00    Years: 45.00    Pack years: 45.00  Types: Cigarettes    Quit date: 12/26/2008    Years since quitting: 10.5  . Smokeless tobacco: Never Used  Substance Use Topics  . Alcohol use: Yes    Comment: occ  . Drug use: No    FAMILY HISTORY: Family History  Problem Relation Age of Onset  . Glaucoma Mother   . High blood pressure Mother   . High Cholesterol Mother   . Sleep apnea Mother   . Diabetes Mellitus II Father   . Sleep apnea Father   . Anxiety disorder Father   . COPD Brother   . Heart disease Brother   . Non-Hodgkin's lymphoma Brother   . Diabetes Mellitus II Brother   . Glaucoma Brother   . Colon cancer Neg Hx   . Esophageal cancer Neg Hx   . Pancreatic cancer Neg Hx   . Stomach cancer Neg Hx   . Liver disease Neg Hx     ROS: Review of Systems  Endo/Heme/Allergies:       Negative for hypoglycemia     PHYSICAL EXAM: Pt in no acute distress  RECENT LABS AND TESTS: BMET    Component Value Date/Time   NA 141 01/21/2019 1321   K 4.4 01/21/2019 1321   CL 103 01/21/2019 1321   CO2 23 01/21/2019 1321   GLUCOSE 101 (H) 01/21/2019 1321   GLUCOSE 119 (H) 07/29/2017 0553   BUN 21  01/21/2019 1321   CREATININE 0.75 01/21/2019 1321   CALCIUM 9.4 01/21/2019 1321   GFRNONAA 82 01/21/2019 1321   GFRAA 95 01/21/2019 1321   Lab Results  Component Value Date   HGBA1C 5.9 (H) 01/21/2019   HGBA1C 5.9 (H) 08/07/2018   HGBA1C 6.6 (H) 04/05/2018   Lab Results  Component Value Date   INSULIN 23.6 01/21/2019   INSULIN 16.4 08/07/2018   INSULIN 30.6 (H) 04/05/2018   CBC    Component Value Date/Time   WBC 6.8 01/21/2019 1321   RBC 4.87 01/21/2019 1321   HGB 14.4 01/21/2019 1321   HCT 44.0 01/21/2019 1321   PLT 342 07/25/2017 1544   MCV 90 01/21/2019 1321   MCH 29.6 01/21/2019 1321   MCHC 32.7 01/21/2019 1321   RDW 14.4 01/21/2019 1321   LYMPHSABS 1.6 01/21/2019 1321   EOSABS 0.1 01/21/2019 1321   BASOSABS 0.0 01/21/2019 1321   Iron/TIBC/Ferritin/ %Sat    Component Value Date/Time   IRON 111 04/05/2018 1009   TIBC 311 04/05/2018 1009   FERRITIN 71 04/05/2018 1009   IRONPCTSAT 36 04/05/2018 1009   Lipid Panel     Component Value Date/Time   CHOL 178 01/21/2019 1321   TRIG 74 01/21/2019 1321   HDL 61 01/21/2019 1321   LDLCALC 102 (H) 01/21/2019 1321   Hepatic Function Panel     Component Value Date/Time   PROT 7.0 01/21/2019 1321   ALBUMIN 4.2 01/21/2019 1321   AST 12 01/21/2019 1321   ALT 20 01/21/2019 1321   ALKPHOS 79 01/21/2019 1321   BILITOT 0.5 01/21/2019 1321      Component Value Date/Time   TSH 0.445 (L) 01/21/2019 1321   TSH 0.821 04/05/2018 1009      I, Renee Ramus, am acting as Location manager for Dennard Nip, MD  I have reviewed the above documentation for accuracy and completeness, and I agree with the above. -Dennard Nip, MD

## 2019-07-08 NOTE — Telephone Encounter (Signed)
Patient knows we will contact her when next hospital date available.

## 2019-07-09 ENCOUNTER — Ambulatory Visit: Payer: Medicare Other | Admitting: Family Medicine

## 2019-07-10 ENCOUNTER — Other Ambulatory Visit (INDEPENDENT_AMBULATORY_CARE_PROVIDER_SITE_OTHER): Payer: Self-pay | Admitting: Family Medicine

## 2019-07-10 ENCOUNTER — Other Ambulatory Visit: Payer: Medicare Other

## 2019-07-10 DIAGNOSIS — E119 Type 2 diabetes mellitus without complications: Secondary | ICD-10-CM

## 2019-07-10 MED ORDER — GLUCOSE BLOOD VI STRP: 1.0000 | ORAL_STRIP | Freq: Two times a day (BID) | 0 refills | Status: DC

## 2019-07-16 ENCOUNTER — Other Ambulatory Visit (INDEPENDENT_AMBULATORY_CARE_PROVIDER_SITE_OTHER): Payer: Self-pay | Admitting: Family Medicine

## 2019-07-19 ENCOUNTER — Ambulatory Visit: Payer: Medicare Other | Admitting: Family Medicine

## 2019-07-25 ENCOUNTER — Other Ambulatory Visit: Payer: Self-pay

## 2019-07-25 ENCOUNTER — Ambulatory Visit (INDEPENDENT_AMBULATORY_CARE_PROVIDER_SITE_OTHER): Payer: Medicare Other | Admitting: Family Medicine

## 2019-07-25 DIAGNOSIS — Z86718 Personal history of other venous thrombosis and embolism: Secondary | ICD-10-CM

## 2019-07-25 DIAGNOSIS — K50919 Crohn's disease, unspecified, with unspecified complications: Secondary | ICD-10-CM | POA: Diagnosis not present

## 2019-07-25 DIAGNOSIS — E119 Type 2 diabetes mellitus without complications: Secondary | ICD-10-CM | POA: Diagnosis not present

## 2019-07-25 DIAGNOSIS — Z9989 Dependence on other enabling machines and devices: Secondary | ICD-10-CM

## 2019-07-25 DIAGNOSIS — G4733 Obstructive sleep apnea (adult) (pediatric): Secondary | ICD-10-CM

## 2019-07-25 DIAGNOSIS — Z6841 Body Mass Index (BMI) 40.0 and over, adult: Secondary | ICD-10-CM

## 2019-07-25 DIAGNOSIS — J849 Interstitial pulmonary disease, unspecified: Secondary | ICD-10-CM

## 2019-07-26 ENCOUNTER — Encounter: Payer: Self-pay | Admitting: Family Medicine

## 2019-07-26 MED ORDER — CLONAZEPAM 0.5 MG PO TABS: 0.2500 mg | ORAL_TABLET | Freq: Every day | ORAL | 0 refills | Status: DC | PRN

## 2019-07-26 NOTE — Telephone Encounter (Signed)
Dr. Juleen China, pt needs refill on Clonazepam 0.5 mg sent to Mail Order. I have loaded the medication for you to send. You have not prescribed this before.

## 2019-08-05 ENCOUNTER — Telehealth: Payer: Self-pay

## 2019-08-05 ENCOUNTER — Encounter: Payer: Self-pay | Admitting: Family Medicine

## 2019-08-05 DIAGNOSIS — K50818 Crohn's disease of both small and large intestine with other complication: Secondary | ICD-10-CM

## 2019-08-05 NOTE — Telephone Encounter (Signed)
Left message for pt to call back regarding rescheduling hospital ECL. Next available hospital date for Dr. Hilarie Fredrickson is 9/8.

## 2019-08-05 NOTE — Progress Notes (Signed)
Patty [name] is a 68 y.o. female is here to [place].   Patient Care Team: Briscoe Deutscher, DO as PCP - General (Family Medicine)   History of Present Illness:   HPI: See Assessment and Plan section for Problem Based Charting of issues discussed today.   Health Maintenance Due  Topic Date Due  . Hepatitis C Screening  07-13-1951  . FOOT EXAM  09/03/1960  . OPHTHALMOLOGY EXAM  09/03/1960  . URINE MICROALBUMIN  09/03/1960  . TETANUS/TDAP  09/03/1969  . MAMMOGRAM  09/03/2000  . COLONOSCOPY  09/03/2000  . DEXA SCAN  09/04/2015  . HEMOGLOBIN A1C  07/22/2019  . INFLUENZA VACCINE  07/27/2019   Depression screen PHQ 2/9 04/05/2018  Decreased Interest 0  Down, Depressed, Hopeless 1  PHQ - 2 Score 1  Altered sleeping 0  Tired, decreased energy 1  Change in appetite 1  Feeling bad or failure about yourself  1  Trouble concentrating 2  Moving slowly or fidgety/restless 0  Suicidal thoughts 0  PHQ-9 Score 6  Difficult doing work/chores Not difficult at all   PMHx, SurgHx, SocialHx, Medications, and Allergies were reviewed in the Visit Navigator and updated as appropriate.   Past Medical History:  Diagnosis Date  . Abnormal nuclear stress test    a. 07/2017: cath with no CAD  . Amyloidosis ([place])   . Anemia   . Crohn disease ([place])   . Crohn's colitis ([place])   . ILD (interstitial lung disease) ([place])   . OSA on CPAP   . Pernicious anemia   . Polycythemia vera ([place])   . Sinus problem      Past Surgical History:  Procedure Laterality Date  . COLON RESECTION    . HIP ARTHROPLASTY Right    x 2  . LEFT HEART CATH AND CORONARY ANGIOGRAPHY N/A 07/28/2017   Procedure: Left Heart Cath and Coronary Angiography;  Surgeon: Burnell Blanks, MD;  Location: [facility];  Service: Cardiovascular;  Laterality: N/A;     Family History  Problem Relation Age of Onset  . Glaucoma Mother   . High blood pressure Mother   . High Cholesterol Mother   . Sleep apnea  Mother   . Diabetes Mellitus II Father   . Sleep apnea Father   . Anxiety disorder Father   . COPD Brother   . Heart disease Brother   . Non-Hodgkin's lymphoma Brother   . Diabetes Mellitus II Brother   . Glaucoma Brother   . Colon cancer Neg Hx   . Esophageal cancer Neg Hx   . Pancreatic cancer Neg Hx   . Stomach cancer Neg Hx   . Liver disease Neg Hx    Social History   Tobacco Use  . Smoking status: Former Smoker    Packs/day: 1.00    Years: 45.00    Pack years: 45.00    Types: Cigarettes    Quit date: 12/26/2008    Years since quitting: 10.6  . Smokeless tobacco: Never Used  Substance Use Topics  . Alcohol use: Yes    Comment: occ  . Drug use: No   Current Medications and Allergies   .  aspirin EC 81 MG tablet, Take 81 mg by mouth daily., Disp: , Rfl:  .  Biotin 5000 MCG SUBL, Place 5,000 mcg under the tongue daily., Disp: , Rfl:  .  blood glucose meter kit and supplies KIT, Dispense based on patient and insurance preference. Use up to twice daily as  directed. (FOR ICD-9 250.00, 250.01)., Disp: 1 each, Rfl: 0 .  Cetirizine HCl (ZYRTEC ALLERGY) 10 MG CAPS, TAKE 1 TABLET DAILY ALONG WITH THE LASIX (Patient taking differently: Take 1 capsule by mouth daily as needed. ), Disp: 90 capsule, Rfl: 3 .  clonazePAM (KLONOPIN) 0.5 MG tablet, Take 0.5-1 tablets (0.25-0.5 mg total) by mouth daily as needed (for anxiety or sleep.)., Disp: 90 tablet, Rfl: 0 .  cyclopentolate (CYCLODRYL,CYCLOGYL) 2 % ophthalmic solution, Place 1 drop into both eyes 3 (three) times daily as needed (for uveitis or iritis)., Disp: , Rfl:  .  FLUoxetine (PROZAC) 10 MG capsule, Take 1 capsule (10 mg total) by mouth daily., Disp: 90 capsule, Rfl: 0 .  fluticasone (FLONASE) 50 MCG/ACT nasal spray, as needed. , Disp: , Rfl:  .  hydrocortisone (PROCTOZONE-HC) 2.5 % rectal cream, Place 1 application rectally 2 (two) times daily as needed for hemorrhoids or anal itching., Disp: 30 g, Rfl: 1 .  Lancets (ONETOUCH  DELICA PLUS [id number]) MISC, USE UP TO TWICE DAILY AS DIRECTED, Disp: 100 each, Rfl: 0 .  mesalamine (PENTASA) 250 MG CR capsule, Take 500 mg by mouth. 2-4 times daily as needed, Disp: , Rfl:  .  ONETOUCH VERIO test strip, USE UP TO TWICE DAILY AS DIRECTED, Disp: 100 strip, Rfl: 0 .  ONETOUCH VERIO test strip, 1 EACH BY OTHER ROUTE 2 (TWO) TIMES A DAY., Disp: 100 strip, Rfl: 0 .  OVER THE COUNTER MEDICATION, Place 1 each under the tongue as needed. , Disp: , Rfl:  .  Probiotic Product (PROBIOTIC PO), Take 1 capsule by mouth once a week., Disp: , Rfl:  .  SUPREP BOWEL PREP KIT 17.5-3.13-1.6 GM/177ML SOLN, Take 1 kit by mouth as directed. For colonoscopy prep, Disp: 354 mL, Rfl: 0 .  topiramate (TOPAMAX) 50 MG tablet, Take 1 tablet (50 mg total) by mouth daily., Disp: 90 tablet, Rfl: 0 .  traMADol-acetaminophen (ULTRACET) 37.5-325 MG tablet, Take 0.5-1 tablets by mouth every 6 (six) hours as needed (for pain.)., Disp: , Rfl:    Allergies  Allergen Reactions  . Lasix [Furosemide] Itching    Hypersensitivity   . Onion Other (See Comments)    sereve diarrhea, stomach pains (Crohn's flares)  . Oxycodone Other (See Comments)    Altered mental status  . Potassium-Containing Compounds Other (See Comments)    Due to chron's, difficult passing though kidneys not allowing absorption in the body   . Prednisone Other (See Comments)    Altered mood  . Sulfa Drugs Cross Reactors Other (See Comments)    "feels like bugs crawling on me"   Review of Systems   Pertinent items are noted in the HPI. Otherwise, a complete ROS is negative.  Physical Exam   Physical Exam Vitals signs and nursing note reviewed.  HENT:     Head: Normocephalic and atraumatic.  Eyes:     Pupils: Pupils are equal, round, and reactive to light.  Neck:     Musculoskeletal: Normal range of motion and neck supple.  Cardiovascular:     Rate and Rhythm: Normal rate and regular rhythm.     Heart sounds: Normal heart sounds.    Pulmonary:     Effort: Pulmonary effort is normal.  Abdominal:     Palpations: Abdomen is soft.  Skin:    General: Skin is warm.  Psychiatric:        Behavior: Behavior normal.    Assessment and Plan   Diagnoses and all orders for this  visit:  Type 2 diabetes mellitus without complication, without long-term current use of insulin (HCC) Lab Results  Component Value Date   HGBA1C 5.9 (H) 01/21/2019   HGBA1C 5.9 (H) 08/07/2018   HGBA1C 6.6 (H) 04/05/2018   Lab Results  Component Value Date   LDLCALC 102 (H) 01/21/2019   CREATININE 0.75 01/21/2019   Controlled on current regimen.   History of DVT (deep vein thrombosis), with PE  Class 3 severe obesity with serious comorbidity and body mass index (BMI) of 40.0 to 44.9 in adult, unspecified obesity type ([place]) Followed by [facility].  OSA on CPAP  Crohn's disease with complication, unspecified gastrointestinal tract location ([place])  ILD (interstitial lung disease) ([place])  Depression, Anxiety Well controlled.  No signs of complications, medication side effects, or red flags.  Continue current regimen.    . Orders and follow up as documented in [place], reviewed diet, exercise and weight control, cardiovascular risk and specific lipid/LDL goals reviewed, reviewed medications and side effects in detail.  . Reviewed expectations re: course of current medical issues. . Outlined signs and symptoms indicating need for more acute intervention. . Patient verbalized understanding and all questions were answered. . Patient received an After Visit Summary.  Briscoe Deutscher, DO [place], [facility] 08/05/2019

## 2019-08-08 ENCOUNTER — Other Ambulatory Visit (HOSPITAL_COMMUNITY): Payer: Medicare Other

## 2019-08-09 ENCOUNTER — Other Ambulatory Visit (INDEPENDENT_AMBULATORY_CARE_PROVIDER_SITE_OTHER): Payer: Medicare Other

## 2019-08-09 ENCOUNTER — Other Ambulatory Visit: Payer: Self-pay

## 2019-08-09 DIAGNOSIS — R195 Other fecal abnormalities: Secondary | ICD-10-CM

## 2019-08-09 DIAGNOSIS — K50818 Crohn's disease of both small and large intestine with other complication: Secondary | ICD-10-CM

## 2019-08-09 DIAGNOSIS — R194 Change in bowel habit: Secondary | ICD-10-CM

## 2019-08-09 LAB — BUN: BUN: 22 mg/dL (ref 6–23)

## 2019-08-09 LAB — CREATININE, SERUM: Creatinine, Ser: 0.81 mg/dL (ref 0.40–1.20)

## 2019-08-09 NOTE — Telephone Encounter (Signed)
Pt to have covid screen test 08/30/19 between 9:30am-3pm and quarantine after test. Colon scheduled at [facility] 09/03/19_0 :30am, pt to arrive there at 7am. Pt aware of appt and instructions.

## 2019-08-12 ENCOUNTER — Encounter (HOSPITAL_COMMUNITY): Payer: Self-pay

## 2019-08-12 ENCOUNTER — Ambulatory Visit (HOSPITAL_COMMUNITY): Admit: 2019-08-12 | Payer: Medicare Other | Admitting: Internal Medicine

## 2019-08-12 SURGERY — COLONOSCOPY WITH PROPOFOL
Anesthesia: Monitor Anesthesia Care

## 2019-08-13 ENCOUNTER — Ambulatory Visit
Admission: RE | Admit: 2019-08-13 | Discharge: 2019-08-13 | Disposition: A | Discharge status: Home / Self Care | Payer: Medicare Other | Source: Ambulatory Visit | Attending: Internal Medicine | Admitting: Internal Medicine

## 2019-08-13 ENCOUNTER — Other Ambulatory Visit: Payer: Self-pay

## 2019-08-13 DIAGNOSIS — R194 Change in bowel habit: Secondary | ICD-10-CM

## 2019-08-13 DIAGNOSIS — K50818 Crohn's disease of both small and large intestine with other complication: Secondary | ICD-10-CM

## 2019-08-13 DIAGNOSIS — R195 Other fecal abnormalities: Secondary | ICD-10-CM

## 2019-08-16 ENCOUNTER — Encounter: Payer: Self-pay | Admitting: Family Medicine

## 2019-08-20 ENCOUNTER — Other Ambulatory Visit: Payer: Self-pay

## 2019-08-20 DIAGNOSIS — E119 Type 2 diabetes mellitus without complications: Secondary | ICD-10-CM

## 2019-08-20 DIAGNOSIS — K50818 Crohn's disease of both small and large intestine with other complication: Secondary | ICD-10-CM

## 2019-08-26 ENCOUNTER — Encounter: Payer: Self-pay | Admitting: Family Medicine

## 2019-08-26 NOTE — Telephone Encounter (Signed)
delicate message.

## 2019-08-28 ENCOUNTER — Other Ambulatory Visit: Payer: Self-pay

## 2019-08-28 ENCOUNTER — Telehealth: Payer: Self-pay | Admitting: Physical Therapy

## 2019-08-28 MED ORDER — FLUCONAZOLE 150 MG PO TABS: 150.0000 mg | ORAL_TABLET | Freq: Once | ORAL | 0 refills | Status: AC

## 2019-08-28 NOTE — Telephone Encounter (Signed)
Copied from [place] [id number]. Topic: General - Call Back - No Documentation >> Aug 28, 2019 11:15 AM Richardo Priest, NT wrote: Reason for CRM: Patient called back for [facility]. Please advise.

## 2019-08-28 NOTE — Progress Notes (Signed)
New instructions sent to MyChart for Colonoscopy at [facility] on Monday, 10-5 at 10:45am.

## 2019-08-29 ENCOUNTER — Ambulatory Visit (INDEPENDENT_AMBULATORY_CARE_PROVIDER_SITE_OTHER)
Admission: RE | Admit: 2019-08-29 | Discharge: 2019-08-29 | Disposition: A | Discharge status: Home / Self Care | Payer: Medicare Other | Source: Ambulatory Visit | Attending: Internal Medicine | Admitting: Internal Medicine

## 2019-08-29 ENCOUNTER — Other Ambulatory Visit: Payer: Self-pay

## 2019-08-29 DIAGNOSIS — N281 Cyst of kidney, acquired: Secondary | ICD-10-CM | POA: Diagnosis not present

## 2019-08-29 DIAGNOSIS — Z8719 Personal history of other diseases of the digestive system: Secondary | ICD-10-CM | POA: Diagnosis not present

## 2019-08-29 DIAGNOSIS — K50818 Crohn's disease of both small and large intestine with other complication: Secondary | ICD-10-CM | POA: Diagnosis not present

## 2019-08-29 MED ORDER — IOHEXOL 300 MG/ML  SOLN
100.0000 mL | Freq: Once | INTRAMUSCULAR | Status: AC | PRN
  Administered 2019-08-29: 100 mL via INTRAVENOUS

## 2019-08-30 ENCOUNTER — Other Ambulatory Visit (HOSPITAL_COMMUNITY): Payer: Medicare Other

## 2019-09-05 ENCOUNTER — Encounter: Payer: Self-pay | Admitting: Family Medicine

## 2019-09-24 ENCOUNTER — Ambulatory Visit (INDEPENDENT_AMBULATORY_CARE_PROVIDER_SITE_OTHER): Payer: Medicare Other | Admitting: Internal Medicine

## 2019-09-24 ENCOUNTER — Encounter: Payer: Self-pay | Admitting: Internal Medicine

## 2019-09-24 ENCOUNTER — Other Ambulatory Visit: Payer: Self-pay

## 2019-09-24 VITALS — BP 110/64 | HR 86 | Temp 98.4°F | Ht 64.0 in | Wt 251.0 lb

## 2019-09-24 DIAGNOSIS — E119 Type 2 diabetes mellitus without complications: Secondary | ICD-10-CM

## 2019-09-24 DIAGNOSIS — R635 Abnormal weight gain: Secondary | ICD-10-CM

## 2019-09-24 DIAGNOSIS — E1169 Type 2 diabetes mellitus with other specified complication: Secondary | ICD-10-CM | POA: Insufficient documentation

## 2019-09-24 DIAGNOSIS — E785 Hyperlipidemia, unspecified: Secondary | ICD-10-CM | POA: Diagnosis not present

## 2019-09-24 LAB — POCT GLYCOSYLATED HEMOGLOBIN (HGB A1C): Hemoglobin A1C: 6 % — AB (ref 4.0–5.6)

## 2019-09-24 NOTE — Patient Instructions (Signed)
24-Hour Urine Collection   You will be collecting your urine for a 24-hour period of time.  Your timer starts with your first urine of the morning (For example - If you first pee at [place], your timer will start at [place])  [place] away your first urine of the morning  Collect your urine every time you pee for the next 24 hours STOP your urine collection 24 hours after you started the collection (For example - You would stop at 9AM the day after you started)

## 2019-09-24 NOTE — Progress Notes (Signed)
Name: Patty [name]  MRN/ DOB: [id number], 01/04/1951   Age/ Sex: 68 y.o., female    PCP: Briscoe Deutscher, DO   Reason for Endocrinology Evaluation: Type 2 Diabetes Mellitus     Date of Initial Endocrinology Visit: 09/24/2019     PATIENT IDENTIFIER: Patty [name] is a 68 y.o. female with a past medical history of Crohn's disease, pernicious anemia , polycythemia vera  and T2DM. The patient presented for initial endocrinology clinic visit on 09/24/2019 for consultative assistance with her diabetes management.    HPI: Patty [name] was    Diagnosed with DM in 2019 Prior Medications tried/Intolerance: Victoza and metformin - reported intolerance Currently checking blood sugars 1x / day,  before breakfast  Hypoglycemia episodes : no             Hemoglobin A1c has ranged from 5.9% in 2020, peaking at 6.6% in 2019. Patient required assistance for hypoglycemia: no Patient has required hospitalization within the last 1 year from hyper or hypoglycemia: no  In terms of diet, the patient eats a healthy low carb diet and exercise     Today she is more interested in pinpointing the reasons for her inability to lose weight. Weight gain started in 2005. She is concerned that autoimmune disease has a role in this since she has been diagnosed with Crohon's disease . She was on prednisone at age 19 but no oral glucocorticoids since.  She does occasionally use prednisone eye drops for uveitis.   She had a 24-hr cortisol in 12/2018 which was normal.    Today she brings a paper wanting to discuss adrenal insufficiency, TRH challenge test and other endocrine causes.      HOME DIABETES REGIMEN: N/A   Statin: no ACE-I/ARB: no Prior Diabetic Education: yes  GLUCOSE LOG:  BG 's < 140 mg/dL   DIABETIC COMPLICATIONS: Microvascular complications:    Denies: retinopathy, neuropathy, CKD  Last eye exam: Completed 06/2019  Macrovascular complications:    Denies: CAD, PVD, CVA    PAST HISTORY: Past Medical History:  Past Medical History:  Diagnosis Date  . Abnormal nuclear stress test    a. 07/2017: cath with no CAD  . Amyloidosis ([facility])   . Anemia   . Crohn disease ([place])   . Crohn's colitis ([place])   . ILD (interstitial lung disease) ([place])   . OSA on CPAP   . Pernicious anemia   . Polycythemia vera ([facility])   . Sinus problem    Past Surgical History:  Past Surgical History:  Procedure Laterality Date  . COLON RESECTION    . HIP ARTHROPLASTY Right    x 2  . LEFT HEART CATH AND CORONARY ANGIOGRAPHY N/A 07/28/2017   Procedure: Left Heart Cath and Coronary Angiography;  Surgeon: Burnell Blanks, MD;  Location: [facility];  Service: Cardiovascular;  Laterality: N/A;      Social History:  reports that she quit smoking about 10 years ago. Her smoking use included cigarettes. She has a 45.00 pack-year smoking history. She has never used smokeless tobacco. She reports current alcohol use. She reports that she does not use drugs. Family History:  Family History  Problem Relation Age of Onset  . Glaucoma Mother   . High blood pressure Mother   . High Cholesterol Mother   . Sleep apnea Mother   . Diabetes Mellitus II Father   . Sleep apnea Father   . Anxiety disorder Father   . COPD Brother   .  Heart disease Brother   . Non-Hodgkin's lymphoma Brother   . Diabetes Mellitus II Brother   . Glaucoma Brother   . Colon cancer Neg Hx   . Esophageal cancer Neg Hx   . Pancreatic cancer Neg Hx   . Stomach cancer Neg Hx   . Liver disease Neg Hx      HOME MEDICATIONS: Allergies as of 09/24/2019      Reactions   Victoza [liraglutide] Shortness Of Breath   Lasix [furosemide] Itching   Hypersensitivity    Metformin And Related Diarrhea   Increased moodiness   Onion Other (See Comments)   sereve diarrhea, stomach pains (Crohn's flares)   Oxycodone Other (See Comments)   Altered mental status   Potassium-containing Compounds Other (See Comments)    Due to chron's, difficult passing though kidneys not allowing absorption in the body    Prednisone Other (See Comments)   Altered mood   Sulfa Drugs Cross Reactors Other (See Comments)   "feels like bugs crawling on me"      Medication List       Accurate as of September 24, 2019  1:21 PM. If you have any questions, ask your nurse or doctor.        aspirin EC 81 MG tablet Take 81 mg by mouth daily.   Biotin 5000 MCG Subl Place 5,000 mcg under the tongue daily.   blood glucose meter kit and supplies Kit Dispense based on patient and insurance preference. Use up to twice daily as directed. (FOR ICD-9 250.00, 250.01).   Cetirizine HCl 10 MG Caps Commonly known as: ZyrTEC Allergy TAKE 1 TABLET DAILY ALONG WITH THE LASIX What changed:   how much to take  how to take this  when to take this  reasons to take this  additional instructions   clonazePAM 0.5 MG tablet Commonly known as: KLONOPIN Take 0.5-1 tablets (0.25-0.5 mg total) by mouth daily as needed (for anxiety or sleep.).   cyclopentolate 2 % ophthalmic solution Commonly known as: CYCLODRYL Place 1 drop into both eyes 3 (three) times daily as needed (for uveitis or iritis).   FLUoxetine 10 MG capsule Commonly known as: PROzac Take 1 capsule (10 mg total) by mouth daily.   fluticasone 50 MCG/ACT nasal spray Commonly known as: FLONASE as needed.   hydrocortisone 2.5 % rectal cream Commonly known as: Proctozone-HC Place 1 application rectally 2 (two) times daily as needed for hemorrhoids or anal itching.   Magnesium 250 MG Tabs Take by mouth.   NON FORMULARY CBD oil prn   OneTouch Delica Plus [id number] Misc USE UP TO TWICE DAILY AS DIRECTED   OneTouch Verio test strip Generic drug: glucose blood USE UP TO TWICE DAILY AS DIRECTED   OneTouch Verio test strip Generic drug: glucose blood 1 EACH BY OTHER ROUTE 2 (TWO) TIMES A DAY.   OVER THE COUNTER MEDICATION Place 1 each under the tongue as  needed.   Pentasa 250 MG CR capsule Generic drug: mesalamine Take 500 mg by mouth. 2-4 times daily as needed   PRENATAL FORTE PO Take by mouth. prn   PROBIOTIC PO Take 1 capsule by mouth once a week.   Suprep Bowel Prep Kit 17.5-3.13-1.6 GM/177ML Soln Generic drug: Na Sulfate-K Sulfate-Mg Sulf Take 1 kit by mouth as directed. For colonoscopy prep   topiramate 50 MG tablet Commonly known as: Topamax Take 1 tablet (50 mg total) by mouth daily.   traMADol-acetaminophen 37.5-325 MG tablet Commonly known as: ULTRACET Take 0.5-1  tablets by mouth every 6 (six) hours as needed (for pain.).        ALLERGIES: Allergies  Allergen Reactions  . Victoza [Liraglutide] Shortness Of Breath  . Lasix [Furosemide] Itching    Hypersensitivity   . Metformin And Related Diarrhea    Increased moodiness  . Onion Other (See Comments)    sereve diarrhea, stomach pains (Crohn's flares)  . Oxycodone Other (See Comments)    Altered mental status  . Potassium-Containing Compounds Other (See Comments)    Due to chron's, difficult passing though kidneys not allowing absorption in the body   . Prednisone Other (See Comments)    Altered mood  . Sulfa Drugs Cross Reactors Other (See Comments)    "feels like bugs crawling on me"     REVIEW OF SYSTEMS: A comprehensive ROS was conducted with the patient and is negative except as per HPI and below:  Review of Systems  Constitutional: Negative for fever and weight loss.  HENT: Negative for congestion and sore throat.   Eyes: Negative for blurred vision and pain.  Respiratory: Negative for cough and shortness of breath.   Cardiovascular: Negative for chest pain and palpitations.  Gastrointestinal: Negative for diarrhea and nausea.  Genitourinary: Negative for dysuria and urgency.  Musculoskeletal: Negative for joint pain.  Neurological: Negative for tingling and tremors.  Psychiatric/Behavioral: Negative for depression. The patient is not  nervous/anxious.       OBJECTIVE:   VITAL SIGNS: BP 110/64 (BP Location: Left Arm, Patient Position: Sitting, Cuff Size: Large)   Pulse 86   Temp 98.4 F (36.9 C)   Ht _0  (1.626 m)   Wt 251 lb (113.9 kg)   SpO2 97%   BMI 43.08 kg/m    PHYSICAL EXAM:  General: Pt appears well and is in NAD  Hydration: Well-hydrated with moist mucous membranes and good skin turgor  HEENT: Head: Unremarkable with good dentition. Oropharynx clear without exudate.  Eyes: External eye exam normal without stare, lid lag or exophthalmos.  EOM intact.    Neck: General: Supple without adenopathy or carotid bruits. Thyroid: Thyroid size normal.  No goiter or nodules appreciated. No thyroid bruit.  Lungs: Clear with good BS bilat with no rales, rhonchi, or wheezes  Heart: RRR with normal S1 and S2 and no gallops; no murmurs; no rub  Abdomen: Normoactive bowel sounds, soft, nontender, without masses or organomegaly palpable  Extremities:  Lower extremities - No pretibial edema. No lesions.  Skin: Normal texture and temperature to palpation. No rash noted.  Neuro: MS is good with appropriate affect, pt is alert and Ox3    DM foot exam: 09/24/2019  The skin of the feet is intact without sores or ulcerations. The pedal pulses are 2+ on right and 2+ on left. The sensation is intact to a screening 5.07, 10 gram monofilament bilaterally   DATA REVIEWED:  Lab Results  Component Value Date   HGBA1C 6.0 (A) 09/24/2019   HGBA1C 5.9 (H) 01/21/2019   HGBA1C 5.9 (H) 08/07/2018   Lab Results  Component Value Date   LDLCALC 102 (H) 01/21/2019   CREATININE 0.81 08/09/2019   No results found for: [facility]  Lab Results  Component Value Date   CHOL 178 01/21/2019   HDL 61 01/21/2019   LDLCALC 102 (H) 01/21/2019   TRIG 74 01/21/2019        ASSESSMENT / PLAN / RECOMMENDATIONS:   1) Type 2 Diabetes Mellitus, Optimally controlled, Without complications - Most recent A1c of  6.0 %. Goal A1c <7.0 %.    - I have discussed with the patient the pathophysiology of diabetes. We went over the natural progression of the disease. We talked about both insulin resistance and insulin deficiency. We stressed the importance of lifestyle changes including diet and exercise. I explained the complications associated with diabetes including retinopathy, nephropathy, neuropathy as well as increased risk of cardiovascular disease. We went over the benefit seen with glycemic control.    - Despite her concerns about her weight gain , she has been able to do a great job with controlling her glucose without medications - Has tried Metformin and victoza with reported intolerance  - I have encouraged her to continue with lifestyle changes, no indication to start glycemic agents unless her A1c > 7.0%    EDUCATION / INSTRUCTIONS:  BG monitoring instructions: Patient is instructed to check her blood sugars  times a day.  Call [facility] if: BG persistently < 70 or > 300. . I reviewed the Rule of 15 for the treatment of hypoglycemia in detail with the patient. Literature supplied.   2) Diabetic complications:   Eye: Does not have known diabetic retinopathy. Recommend annual eye exams  Neuro/ Feet: Does not have known diabetic peripheral neuropathy.  Renal: Patient does not have known baseline CKD. She is not  on an ACEI/ARB at present.I would recommend an annual Check urine albumin/creatinine ratio. If albuminuria is positive, treatment is geared toward better glucose, blood pressure control and use of ACE inhibitors or ARBs. Monitor electrolytes and creatinine once to twice yearly.This can be done at her PCP's office.   3) Lipids: Patient is not on a statin. I have discussed with her the ADA recommendations of initiating statin therapy on all patients with DM between the ages 1-75 . We discussed the cardiovascular benefits of statins but she is reluctant at this time.    4) Morbid Obesity :    - Will screen again for cushing syndrome with 24-hr urine collection  - I have also advised her to hold off on Biotin for 48 hrs prior to any thyroid checks.  - No need for serial checking of insulin level, the pt is insulin resistant based on her weight status.  - Discussed genetic involvement with obesity. Brother with same body habitus, grandparents on heavy side.     F/u PRN    Signed electronically by: Mack Guise, MD  [facility]  [facility] [address] [place], [place] [address] Phone: [phone] FAX: [phone]   CC: [address]isco[address], [place] [address] Phone: [phone]  Fax: [phone]    Return to Endocrinology clinic as below: Future Appointments  Date Time Provider [place]  09/26/2019  2:55 PM [facility]-SCREENING [facility] None  10/01/2019  4:00 PM [facility]-PFT RM [facility] None  10/07/2019 11:00 AM Brand Males, MD [facility] None

## 2019-09-24 NOTE — Progress Notes (Signed)
Unable to reach Patty [name] for preop endoscopy call. Instructed to return call.

## 2019-09-25 ENCOUNTER — Encounter (HOSPITAL_COMMUNITY): Payer: Self-pay

## 2019-09-25 NOTE — Progress Notes (Signed)
Attempted to obtain medical history via telephone, unable to reach at this time. I left a voicemail to return pre surgical testing department's phone call.

## 2019-09-26 ENCOUNTER — Other Ambulatory Visit: Payer: Self-pay

## 2019-09-26 ENCOUNTER — Telehealth: Payer: Self-pay | Admitting: Internal Medicine

## 2019-09-26 ENCOUNTER — Other Ambulatory Visit (HOSPITAL_COMMUNITY)
Admission: RE | Admit: 2019-09-26 | Discharge: 2019-09-26 | Disposition: A | Discharge status: Home / Self Care | Payer: Medicare Other | Source: Ambulatory Visit | Attending: Internal Medicine | Admitting: Internal Medicine

## 2019-09-26 ENCOUNTER — Encounter (HOSPITAL_COMMUNITY): Payer: Self-pay | Admitting: Emergency Medicine

## 2019-09-26 DIAGNOSIS — Z20828 Contact with and (suspected) exposure to other viral communicable diseases: Secondary | ICD-10-CM | POA: Diagnosis not present

## 2019-09-26 NOTE — Telephone Encounter (Signed)
Pt stated that she is scheduled for a colon at [facility] but she would like to have an EGD as well.

## 2019-09-26 NOTE — Progress Notes (Signed)
Pre-op endo call completed.  Patient states she is supposed to be having EGD with colonoscopy, advised to check with Dr Hilarie Fredrickson office and also ask about ASA 81 instructions. Patient verbalized instructions

## 2019-09-26 NOTE — Telephone Encounter (Signed)
EGD was added to colon appt.

## 2019-09-27 ENCOUNTER — Other Ambulatory Visit (INDEPENDENT_AMBULATORY_CARE_PROVIDER_SITE_OTHER): Payer: Self-pay | Admitting: Family Medicine

## 2019-09-27 DIAGNOSIS — E119 Type 2 diabetes mellitus without complications: Secondary | ICD-10-CM

## 2019-09-27 LAB — NOVEL CORONAVIRUS, NAA (HOSP ORDER, SEND-OUT TO REF LAB; TAT 18-24 HRS): SARS-CoV-2, NAA: NOT DETECTED

## 2019-09-30 ENCOUNTER — Ambulatory Visit (HOSPITAL_COMMUNITY): Payer: Medicare Other | Admitting: Anesthesiology

## 2019-09-30 ENCOUNTER — Encounter (HOSPITAL_COMMUNITY)
Admission: RE | Disposition: A | Discharge status: Home / Self Care | Payer: Self-pay | Source: Home / Self Care | Attending: Internal Medicine

## 2019-09-30 ENCOUNTER — Ambulatory Visit (HOSPITAL_COMMUNITY)
Admission: RE | Admit: 2019-09-30 | Discharge: 2019-09-30 | Disposition: A | Discharge status: Home / Self Care | Payer: Medicare Other | Attending: Internal Medicine | Admitting: Internal Medicine

## 2019-09-30 ENCOUNTER — Encounter (HOSPITAL_COMMUNITY): Payer: Self-pay

## 2019-09-30 ENCOUNTER — Other Ambulatory Visit: Payer: Self-pay

## 2019-09-30 DIAGNOSIS — Z86718 Personal history of other venous thrombosis and embolism: Secondary | ICD-10-CM | POA: Diagnosis not present

## 2019-09-30 DIAGNOSIS — K644 Residual hemorrhoidal skin tags: Secondary | ICD-10-CM | POA: Diagnosis not present

## 2019-09-30 DIAGNOSIS — Z980 Intestinal bypass and anastomosis status: Secondary | ICD-10-CM | POA: Diagnosis not present

## 2019-09-30 DIAGNOSIS — K50818 Crohn's disease of both small and large intestine with other complication: Secondary | ICD-10-CM

## 2019-09-30 DIAGNOSIS — Z1211 Encounter for screening for malignant neoplasm of colon: Secondary | ICD-10-CM | POA: Insufficient documentation

## 2019-09-30 DIAGNOSIS — E119 Type 2 diabetes mellitus without complications: Secondary | ICD-10-CM | POA: Insufficient documentation

## 2019-09-30 DIAGNOSIS — K529 Noninfective gastroenteritis and colitis, unspecified: Secondary | ICD-10-CM | POA: Diagnosis not present

## 2019-09-30 DIAGNOSIS — K5080 Crohn's disease of both small and large intestine without complications: Secondary | ICD-10-CM | POA: Diagnosis not present

## 2019-09-30 DIAGNOSIS — Z86711 Personal history of pulmonary embolism: Secondary | ICD-10-CM | POA: Insufficient documentation

## 2019-09-30 DIAGNOSIS — K621 Rectal polyp: Secondary | ICD-10-CM | POA: Diagnosis not present

## 2019-09-30 DIAGNOSIS — Z87891 Personal history of nicotine dependence: Secondary | ICD-10-CM | POA: Diagnosis not present

## 2019-09-30 DIAGNOSIS — R195 Other fecal abnormalities: Secondary | ICD-10-CM

## 2019-09-30 DIAGNOSIS — Z79899 Other long term (current) drug therapy: Secondary | ICD-10-CM | POA: Insufficient documentation

## 2019-09-30 DIAGNOSIS — K5010 Crohn's disease of large intestine without complications: Secondary | ICD-10-CM | POA: Diagnosis not present

## 2019-09-30 DIAGNOSIS — G4733 Obstructive sleep apnea (adult) (pediatric): Secondary | ICD-10-CM | POA: Diagnosis not present

## 2019-09-30 DIAGNOSIS — D128 Benign neoplasm of rectum: Secondary | ICD-10-CM

## 2019-09-30 DIAGNOSIS — R194 Change in bowel habit: Secondary | ICD-10-CM

## 2019-09-30 HISTORY — DX: Acute embolism and thrombosis of unspecified deep veins of unspecified lower extremity: I82.409

## 2019-09-30 HISTORY — DX: Other ill-defined heart diseases: I51.89

## 2019-09-30 HISTORY — PX: COLONOSCOPY WITH PROPOFOL: SHX5780

## 2019-09-30 HISTORY — PX: BIOPSY: SHX5522

## 2019-09-30 HISTORY — PX: POLYPECTOMY: SHX5525

## 2019-09-30 SURGERY — COLONOSCOPY WITH PROPOFOL
Anesthesia: Monitor Anesthesia Care

## 2019-09-30 MED ORDER — SODIUM CHLORIDE 0.9 % IV SOLN
INTRAVENOUS | Status: DC
  Administered 2019-09-30: 500 mL via INTRAVENOUS

## 2019-09-30 MED ORDER — PROPOFOL 10 MG/ML IV BOLUS
INTRAVENOUS | Status: AC
  Filled 2019-09-30: qty 60

## 2019-09-30 MED ORDER — LIDOCAINE 2% (20 MG/ML) 5 ML SYRINGE
INTRAMUSCULAR | Status: DC | PRN
  Administered 2019-09-30: 100 mg via INTRAVENOUS

## 2019-09-30 MED ORDER — PROPOFOL 10 MG/ML IV BOLUS
INTRAVENOUS | Status: DC | PRN
  Administered 2019-09-30 (×4): 20 mg via INTRAVENOUS

## 2019-09-30 MED ORDER — PROPOFOL 500 MG/50ML IV EMUL
INTRAVENOUS | Status: DC | PRN
  Administered 2019-09-30: 100 ug/kg/min via INTRAVENOUS

## 2019-09-30 MED ORDER — SODIUM CHLORIDE 0.9 % IV SOLN: INTRAVENOUS | Status: DC

## 2019-09-30 SURGICAL SUPPLY — 25 items

## 2019-09-30 NOTE — Transfer of Care (Signed)
Immediate Anesthesia Transfer of Care Note  Patient: Patty [name]  Procedure(s) Performed: COLONOSCOPY WITH PROPOFOL (N/A ) BIOPSY POLYPECTOMY  Patient Location: Endoscopy Unit  Anesthesia Type:MAC  Level of Consciousness: awake and alert   Airway & Oxygen Therapy: Patient Spontanous Breathing and Patient connected to face mask oxygen  Post-op Assessment: Report given to RN and Post -op Vital signs reviewed and stable  Post vital signs: Reviewed and stable  Last Vitals:  Vitals Value Taken Time  BP    Temp    Pulse 79 09/30/19 1054  Resp 16 09/30/19 1054  SpO2 100 % 09/30/19 1054  Vitals shown include unvalidated device data.  Last Pain:  Vitals:   09/30/19 0936  TempSrc: Oral  PainSc: 0-No pain         Complications: No apparent anesthesia complications

## 2019-09-30 NOTE — H&P (Signed)
HPI: Patty [name] is a 68 year old female with a history of small bowel and colonic Crohn's disease with prior small bowel fistula, history of colonic stricture status post partial colonic resection 93, history of uveitis and iritis, ILD on oxygen who presents for surveillance colonoscopy.  She was seen in clinic on 06/25/2019 and presents for surveillance colonoscopy  No new changes to her symptoms  Past Medical History:  Diagnosis Date  . Abnormal nuclear stress test    a. 07/2017: cath with no CAD  . Amyloidosis ([place])   . Crohn disease ([place])   . Crohn's colitis ([place])   . DVT (deep venous thrombosis) ([place])   . Fracture    pelvic fracture   . Grade I diastolic dysfunction   . ILD (interstitial lung disease) ([place])   . OSA on CPAP   . Pernicious anemia   . Polycythemia   . Polycythemia vera ([place])   . Pulmonary embolism ([place]) 2010   s/p hip surgery , reports this was never truly confirmed   . Sinus problem   . Type 2 diabetes mellitus ([facility])    diet controlled    Past Surgical History:  Procedure Laterality Date  . COLON RESECTION  1993  . HIP ARTHROPLASTY Right    x 2, initial right THA, then subsequent right THA revision   . LEFT HEART CATH AND CORONARY ANGIOGRAPHY N/A 07/28/2017   Procedure: Left Heart Cath and Coronary Angiography;  Surgeon: Burnell Blanks, MD;  Location: [facility];  Service: Cardiovascular;  Laterality: N/A;    (Not in an outpatient encounter)   Allergies  Allergen Reactions  . Victoza [Liraglutide] Shortness Of Breath  . Lasix [Furosemide] Itching    Hypersensitivity   . Metformin And Related Diarrhea    Increased moodiness  . Onion Other (See Comments)    sereve diarrhea, stomach pains (Crohn's flares)  . Oxycodone Other (See Comments)    Altered mental status  . Potassium-Containing Compounds Other (See Comments)    Due to chron's, difficult passing though kidneys not allowing absorption in the body   . Prednisone Other  (See Comments)    Altered mood  . Sulfa Drugs Cross Reactors Other (See Comments)    "feels like bugs crawling on me"    Family History  Problem Relation Age of Onset  . Glaucoma Mother   . High blood pressure Mother   . High Cholesterol Mother   . Sleep apnea Mother   . Diabetes Mellitus II Father   . Sleep apnea Father   . Anxiety disorder Father   . COPD Brother   . Heart disease Brother   . Non-Hodgkin's lymphoma Brother   . Diabetes Mellitus II Brother   . Glaucoma Brother   . Colon cancer Neg Hx   . Esophageal cancer Neg Hx   . Pancreatic cancer Neg Hx   . Stomach cancer Neg Hx   . Liver disease Neg Hx     Social History   Tobacco Use  . Smoking status: Former Smoker    Packs/day: 1.00    Years: 45.00    Pack years: 45.00    Types: Cigarettes    Quit date: 12/26/2008    Years since quitting: 10.7  . Smokeless tobacco: Never Used  Substance Use Topics  . Alcohol use: Yes    Comment: occ  . Drug use: No    ROS: As per history of present illness, otherwise negative  BP 121/67   Pulse 79  Temp 98.4 F (36.9 C) (Oral)   Resp 15   Ht 5' 4" (1.626 m)   Wt 113.4 kg   SpO2 97%   BMI 42.91 kg/m  Gen: awake, alert, NAD HEENT: anicteric, op clear CV: RRR, no mrg Pulm: CTA b/l Abd: soft, NT/ND, +BS throughout Ext: no c/c/e Neuro: nonfocal   RELEVANT LABS AND IMAGING: CBC    Component Value Date/Time   WBC 6.8 01/21/2019 1321   RBC 4.87 01/21/2019 1321   HGB 14.4 01/21/2019 1321   HCT 44.0 01/21/2019 1321   PLT 342 07/25/2017 1544   MCV 90 01/21/2019 1321   MCH 29.6 01/21/2019 1321   MCHC 32.7 01/21/2019 1321   RDW 14.4 01/21/2019 1321   LYMPHSABS 1.6 01/21/2019 1321   EOSABS 0.1 01/21/2019 1321   BASOSABS 0.0 01/21/2019 1321    CMP     Component Value Date/Time   NA 141 01/21/2019 1321   K 4.4 01/21/2019 1321   CL 103 01/21/2019 1321   CO2 23 01/21/2019 1321   GLUCOSE 101 (H) 01/21/2019 1321   GLUCOSE 119 (H) 07/29/2017 0553   BUN  22 08/09/2019 1503   BUN 21 01/21/2019 1321   CREATININE 0.81 08/09/2019 1503   CALCIUM 9.4 01/21/2019 1321   PROT 7.0 01/21/2019 1321   ALBUMIN 4.2 01/21/2019 1321   AST 12 01/21/2019 1321   ALT 20 01/21/2019 1321   ALKPHOS 79 01/21/2019 1321   BILITOT 0.5 01/21/2019 1321   GFRNONAA 82 01/21/2019 1321   GFRAA 95 01/21/2019 1321    ASSESSMENT/PLAN: 68 year old female with a history of small bowel and colonic Crohn's disease with prior small bowel fistula, history of colonic stricture status post partial colonic resection 93, history of uveitis and iritis, ILD on oxygen who presents for surveillance colonoscopy.  1.  Crohn's disease of the small bowel and colon, longstanding --for surveillance colonoscopy today.  Recent CT enterography reassuring.  The nature of the procedure, as well as the risks, benefits, and alternatives were carefully and thoroughly reviewed with the patient. Ample time for discussion and questions allowed. The patient understood, was satisfied, and agreed to proceed.

## 2019-09-30 NOTE — Discharge Instructions (Signed)
YOU HAD AN ENDOSCOPIC PROCEDURE TODAY: Refer to the procedure report and other information in the discharge instructions given to you for any specific questions about what was found during the examination. If this information does not answer your questions, please call [place] office at [phone] to clarify.  ° °YOU SHOULD EXPECT: Some feelings of bloating in the abdomen. Passage of more gas than usual. Walking can help get rid of the air that was put into your GI tract during the procedure and reduce the bloating. If you had a lower endoscopy (such as a colonoscopy or flexible sigmoidoscopy) you may notice spotting of blood in your stool or on the toilet paper. Some abdominal soreness may be present for a day or two, also. ° °DIET: Your first meal following the procedure should be a light meal and then it is ok to progress to your normal diet. A half-sandwich or bowl of soup is an example of a good first meal. Heavy or fried foods are harder to digest and may make you feel nauseous or bloated. Drink plenty of fluids but you should avoid alcoholic beverages for 24 hours. If you had a esophageal dilation, please see attached instructions for diet.   ° °ACTIVITY: Your care partner should take you home directly after the procedure. You should plan to take it easy, moving slowly for the rest of the day. You can resume normal activity the day after the procedure however YOU SHOULD NOT DRIVE, use power tools, machinery or perform tasks that involve climbing or major physical exertion for 24 hours (because of the sedation medicines used during the test).  ° °SYMPTOMS TO REPORT IMMEDIATELY: °A gastroenterologist can be reached at any hour. Please call [phone]  for any of the following symptoms:  °Following lower endoscopy (colonoscopy, flexible sigmoidoscopy) °Excessive amounts of blood in the stool  °Significant tenderness, worsening of abdominal pains  °Swelling of the abdomen that is new, acute  °Fever of 100° or  higher  °Following upper endoscopy (EGD, EUS, ERCP, esophageal dilation) °Vomiting of blood or coffee ground material  °New, significant abdominal pain  °New, significant chest pain or pain under the shoulder blades  °Painful or persistently difficult swallowing  °New shortness of breath  °Black, tarry-looking or red, bloody stools ° °FOLLOW UP:  °If any biopsies were taken you will be contacted by phone or by letter within the next 1-3 weeks. Call [phone]  if you have not heard about the biopsies in 3 weeks.  °Please also call with any specific questions about appointments or follow up tests. ° °

## 2019-09-30 NOTE — Anesthesia Preprocedure Evaluation (Addendum)
Anesthesia Evaluation  Patient identified by MRN, date of birth, ID band Patient awake    Reviewed: Allergy & Precautions, NPO status , Patient's Chart, lab work & pertinent test results  Airway Mallampati: II  TM Distance: >3 FB Neck ROM: Full    Dental no notable dental hx. (+) Teeth Intact   Pulmonary sleep apnea , former smoker,    Pulmonary exam normal breath sounds clear to auscultation       Cardiovascular Exercise Tolerance: Good negative cardio ROS Normal cardiovascular exam Rhythm:Regular Rate:Normal  EF 53%   Neuro/Psych Depression negative neurological ROS  negative psych ROS   GI/Hepatic negative GI ROS, Neg liver ROS, Hx of Crohns DZ   Endo/Other  diabetes, Type 2  Renal/GU negative Renal ROS     Musculoskeletal negative musculoskeletal ROS (+)   Abdominal (+) + obese,   Peds  Hematology  (+) anemia ,   Anesthesia Other Findings All see list  Reproductive/Obstetrics negative OB ROS                           Anesthesia Physical Anesthesia Plan  ASA: III  Anesthesia Plan: MAC   Post-op Pain Management:    Induction: Intravenous  PONV Risk Score and Plan: Treatment may vary due to age or medical condition  Airway Management Planned: Natural Airway and Nasal Cannula  Additional Equipment:   Intra-op Plan:   Post-operative Plan:   Informed Consent: I have reviewed the patients History and Physical, chart, labs and discussed the procedure including the risks, benefits and alternatives for the proposed anesthesia with the patient or authorized representative who has indicated his/her understanding and acceptance.     Dental advisory given  Plan Discussed with: CRNA  Anesthesia Plan Comments: (Screening colonoscopy )       Anesthesia Quick Evaluation

## 2019-09-30 NOTE — Anesthesia Postprocedure Evaluation (Signed)
Anesthesia Post Note  Patient: Patty [name]  Procedure(s) Performed: COLONOSCOPY WITH PROPOFOL (N/A ) BIOPSY POLYPECTOMY     Patient location during evaluation: Endoscopy Anesthesia Type: MAC Level of consciousness: awake and alert Pain management: pain level controlled Vital Signs Assessment: post-procedure vital signs reviewed and stable Respiratory status: spontaneous breathing, nonlabored ventilation, respiratory function stable and patient connected to nasal cannula oxygen Cardiovascular status: blood pressure returned to baseline and stable Postop Assessment: no apparent nausea or vomiting Anesthetic complications: no    Last Vitals:  Vitals:   09/30/19 1105 09/30/19 1110  BP:    Pulse: 72 69  Resp: 18 18  Temp:    SpO2: 96% 95%    Last Pain:  Vitals:   09/30/19 1100  TempSrc:   PainSc: 0-No pain                 [place]

## 2019-09-30 NOTE — Op Note (Signed)
[facility] Patient Name: Patty [name] Procedure Date: 09/30/2019 MRN: [id number] Attending MD: Jerene Bears , MD Date of Birth: October 22, 1951 CSN: [id number] Age: 68 Admit Type: Outpatient Procedure:                Colonoscopy Indications:              High risk colon cancer surveillance: Crohn's                            colitis of 8 (or more) years duration with                            one-third (or more) of the colon involved (personal                            history of ileocolonic Crohn's disease complicated                            by fistula and stricturing s/p ileal and partial                            colonic resection Providers:                Lajuan Lines. Hilarie Fredrickson, MD, Jeanella Cara, RN,                            Lazaro Arms, Technician, Marguerita Merles, Technician Referring MD:             Josefina Do. Juleen China Medicines:                Monitored Anesthesia Care Complications:            No immediate complications. Estimated Blood Loss:     Estimated blood loss was minimal. Procedure:                Pre-Anesthesia Assessment:                           - Prior to the procedure, a History and Physical                            was performed, and patient medications and                            allergies were reviewed. The patient's tolerance of                            previous anesthesia was also reviewed. The risks                            and benefits of the procedure and the sedation                            options and risks were discussed with the patient.  All questions were answered, and informed consent                            was obtained. Prior Anticoagulants: The patient has                            taken no previous anticoagulant or antiplatelet                            agents. ASA Grade Assessment: III - A patient with                            severe systemic disease. After reviewing the risks                            and benefits, the patient was deemed in                            satisfactory condition to undergo the procedure.                           After obtaining informed consent, the colonoscope                            was passed under direct vision. Throughout the                            procedure, the patient's blood pressure, pulse, and                            oxygen saturations were monitored continuously. The                            PCF-H190DL ([id number] Olympus pediatric colonoscope                            was introduced through the anus and advanced to the                            terminal ileum. The colonoscopy was performed                            without difficulty. The patient tolerated the                            procedure well. The quality of the bowel                            preparation was good. The terminal ileum and the                            rectum were photographed. Scope In: 10:36:54 AM Scope Out: 10:49:37 AM Scope Withdrawal Time: 0 hours 10 minutes 39 seconds  Total Procedure Duration: 0 hours  12 minutes 43 seconds  Findings:      Skin tags within the anal canal, one of which was prolapsed but able to       be reduced, found on perianal exam. Evidence of scarring in the anal       canal without mass or stenosis palpable, likely related to previously       active perianal Crohn's disease.      There was evidence of a prior end-to-end colo-colonic anastomosis in the       proximal sigmoid colon. This was patent and was characterized by       ulceration (located at the anastomosis). The anastomosis was traversed.      Scattered inflammation, mild in severity and characterized by erosions       was found in the distal neo-terminal Ileum. Biopsies were taken with a       cold forceps for histology. About 3-4 cm above the terminal ileum the       ileal mucosa was normal.      A 2 mm polyp was found in the distal rectum  just above the dentate line.       The polyp was sessile. The polyp was removed with a cold biopsy forceps.       Resection and retrieval were complete.      No additional abnormalities were found on retroflexion. Impression:               - Perianal skin tags and scarring due to perianal                            Crohn's disease found on perianal exam (as above).                           - Patent end-to-end colo-colonic anastomosis,                            characterized by 1 ulceration.                           - Very mild ileitis, consistent with Crohn's                            disease. Biopsied.                           - One 2 mm polyp in the rectum, removed with a cold                            biopsy forceps. Resected and retrieved. Moderate Sedation:      N/A Recommendation:           - Patient has a contact number available for                            emergencies. The signs and symptoms of potential                            delayed complications were discussed with the  patient. Return to normal activities tomorrow.                            Written discharge instructions were provided to the                            patient.                           - Resume previous diet.                           - Continue present medications.                           - Await pathology results.                           - Return to my office in 2-3 months.                           - Repeat colonoscopy for surveillance based on                            pathology results. Procedure Code(s):        --- Professional ---                           [phone], Colonoscopy, flexible; with biopsy, single                            or multiple Diagnosis Code(s):        --- Professional ---                           K50.10, Crohn's disease of large intestine without                            complications                           Z98.0, Intestinal bypass  and anastomosis status                           K52.9, Noninfective gastroenteritis and colitis,                            unspecified                           K62.1, Rectal polyp                           K64.4, Residual hemorrhoidal skin tags CPT copyright 2019 [employer]. All rights reserved. The codes documented in this report are preliminary and upon coder review may  be revised to meet current compliance requirements. Jerene Bears, MD 09/30/2019 11:03:42 AM This report has been signed electronically. Number of Addenda: 0

## 2019-10-01 ENCOUNTER — Ambulatory Visit (INDEPENDENT_AMBULATORY_CARE_PROVIDER_SITE_OTHER): Payer: Medicare Other | Admitting: Internal Medicine

## 2019-10-01 ENCOUNTER — Encounter (HOSPITAL_COMMUNITY): Payer: Self-pay | Admitting: Internal Medicine

## 2019-10-01 ENCOUNTER — Encounter: Payer: Self-pay | Admitting: Family Medicine

## 2019-10-01 ENCOUNTER — Other Ambulatory Visit: Payer: Self-pay

## 2019-10-01 DIAGNOSIS — J849 Interstitial pulmonary disease, unspecified: Secondary | ICD-10-CM | POA: Diagnosis not present

## 2019-10-01 LAB — PULMONARY FUNCTION TEST
DL/VA % pred: 75 %
DL/VA: 3.15 ml/min/mmHg/L
DLCO unc % pred: 75 %
DLCO unc: 14.39 ml/min/mmHg
FEF 25-75 Pre: 1.05 L/sec
FEF2575-%Pred-Pre: 55 %
FEV1-%Pred-Pre: 74 %
FEV1-Pre: 1.65 L
FEV1FVC-%Pred-Pre: 90 %
FEV6-%Pred-Pre: 84 %
FEV6-Pre: 2.38 L
FEV6FVC-%Pred-Pre: 104 %
FVC-%Pred-Pre: 81 %
FVC-Pre: 2.38 L
Pre FEV1/FVC ratio: 69 %
Pre FEV6/FVC Ratio: 100 %

## 2019-10-01 LAB — SURGICAL PATHOLOGY

## 2019-10-01 MED ORDER — ONETOUCH DELICA PLUS LANCET33G MISC: 1.0000 | Freq: Two times a day (BID) | 3 refills | Status: DC

## 2019-10-01 MED ORDER — ONETOUCH VERIO VI STRP: ORAL_STRIP | 0 refills | Status: DC

## 2019-10-01 NOTE — Progress Notes (Signed)
Spiro/DLCO performed today. 

## 2019-10-04 ENCOUNTER — Encounter: Payer: Self-pay | Admitting: Internal Medicine

## 2019-10-07 ENCOUNTER — Encounter: Payer: Self-pay | Admitting: Internal Medicine

## 2019-10-07 ENCOUNTER — Other Ambulatory Visit: Payer: Self-pay

## 2019-10-07 ENCOUNTER — Encounter: Payer: Self-pay | Admitting: Family Medicine

## 2019-10-07 ENCOUNTER — Ambulatory Visit (INDEPENDENT_AMBULATORY_CARE_PROVIDER_SITE_OTHER): Payer: Medicare Other | Admitting: Internal Medicine

## 2019-10-07 VITALS — BP 118/80 | HR 90 | Temp 97.3°F | Ht 64.0 in | Wt 251.0 lb

## 2019-10-07 DIAGNOSIS — F3289 Other specified depressive episodes: Secondary | ICD-10-CM

## 2019-10-07 DIAGNOSIS — J8410 Pulmonary fibrosis, unspecified: Secondary | ICD-10-CM | POA: Diagnosis not present

## 2019-10-07 DIAGNOSIS — J849 Interstitial pulmonary disease, unspecified: Secondary | ICD-10-CM

## 2019-10-07 DIAGNOSIS — J439 Emphysema, unspecified: Secondary | ICD-10-CM

## 2019-10-07 DIAGNOSIS — Z23 Encounter for immunization: Secondary | ICD-10-CM

## 2019-10-07 MED ORDER — AMOXICILLIN 500 MG PO TABS: 500.0000 mg | ORAL_TABLET | Freq: Three times a day (TID) | ORAL | 0 refills | Status: DC

## 2019-10-07 MED ORDER — SPIRIVA RESPIMAT 1.25 MCG/ACT IN AERS: 2.0000 | INHALATION_SPRAY | Freq: Every day | RESPIRATORY_TRACT | 0 refills | Status: DC

## 2019-10-07 MED ORDER — TOPIRAMATE 50 MG PO TABS: 50.0000 mg | ORAL_TABLET | Freq: Every day | ORAL | 1 refills | Status: DC

## 2019-10-07 MED ORDER — FLUOXETINE HCL 10 MG PO CAPS: 10.0000 mg | ORAL_CAPSULE | Freq: Every day | ORAL | 1 refills | Status: DC

## 2019-10-07 NOTE — Patient Instructions (Addendum)
Pulmonary emphysema with fibrosis of lung ([place])  - continue o2 with exertion and exercise  - have high dose flu shot 10/07/2019   ILD (interstitial lung disease) ([place])  - does not appear to be IPF or Autoimmune - seems to be a non-progressive variety called NSIP - PFt worsening is probably due to weight gain but we need to be careful is not ILD - given stability will continue to monitor and if gets worse can consider anti-fibrotic  Pulmonary emphysema, unspecified emphysema type ([place])  - re- start spiriva respimat scheduled - take 2 samples and then price it  - if the help is significant you can call and we can give you prescrioption  -otherwise do albuterol as needed - check alpha 1 AT 10/07/2019 given family history  Followup 3 months do spirometry/dlco and if worsening might need to get CT chest sooner (last in march 2020) Return to regular clinic in  6 months

## 2019-10-07 NOTE — Progress Notes (Signed)
_0  ID: Patty [name], female    DOB: 1951/10/25, 68 y.o.   MRN: [id number]  Chief Complaint  Patient presents with  . Follow-up    dyspnea     Referring provider: No ref. provider found  HPI: 68 year old female former smoker seen for pulmonary consulokay guarded because then maybe they can meet him right now for the registryt 05/09/2017 for progressive dyspnea since 2004.[name] Patient has polycythemia vera followed by hematology and [place] She has Crohn's disease    IOV  05/09/2017  Chief Complaint  Patient presents with  . Pulmonary Consult    Pt referred for SOB with activity x years. Pt states over the last year the DOE has worsened. Pt denies cough and CP/tightness and f/c/s.     68 year old obese female. In 2004 Started noticing insidious onset of shortness of breath. In 2005 more [place] from [place] and probably gain over 60 pounds of weight. She has extensive workup at that time according to her history. She was then diagnosed with polycythemia by Dr. [name] at [facility]. She does not recollect any phlebotomies for this. However in the last 4-5 years she's had worsening shortness of breath. Noticeable with exertion particularly in the gym and doing standing exercises but not so much with sitting exercises. Relieved by rest. When she does some yard work she notices some associated wheezing as well.  Walking desaturation test in the office she did desaturate to 88% and did get tachycardic. She says that a chest x-ray done by primary care physicians interstitial pulmonary fibrosis. I personally visualized the chest x-ray done 04/03/2017: There is some interstitial markings but it is obscured by her obesity. I'm not so certain that is definite ILD. There is no lab work in the record. There is no PFT a CT scan available for visualization     05/29/2017 Follow up : Dyspnea  Patient returns for a two-week follow-up. Patient was seen for pulmonary consult  05/09/2017 for progressive dyspnea since 2004. Patient was set up for a high resolution CT chest that showed a very mild basilar predominant subpleural reticulation and bronchiectasis which could be due to nonspecific interstitial pneumonitis.. CT did have incidental findings of enlarged pulmonary arteries and aortic and coronary calcification. Patient had pulmonary function test on May 23 that showed an FEV1 at 77%, ratio 73, FVC 81, no significant bronchodilator response, total lung capacity 100%, DLCO 52%. Overnight oximetry test did show significant desaturations. We discussed beginning oxygen at bedtime.  Patient denies significant cough. Says that she get short of breath with walking. Has daytime fatigue and low energy..  Interstitial lung disease patient questionnaire was completed as follows Patient has no previous use of Macrodantin, amiodarone, methotrexate. She has been treated with prednisone for her Crohn's in the past. Patient has had extensive travel with brief visits to multiple state and country's.. [place]. Did live in a apartment that she did notice mold for 4 years. She does have Crohn's disease and was on Mesamaline for many years. Patient says she has had a humidifier and hot tub and previous house. She has no indoor birds. She does have a dog. Patient says she was told that she had a blood clot in the past but was never found.   OV 08/15/2017  Chief Complaint  Patient presents with  . Follow-up    Pt still gets occ. SOB. Other than that, pt states that she has been doing good. Denies  any cough or CP.     Follow-up multifactorial dyspnea with interstitial lung disease concern.   Regarding interstitial lung disease concern: Mid May 2018 she had high resolution CT chest that showed interstitial lung disease very mild but unclear if possible UIP pattern. Autoimmune test was negative. Isolated reduction in diffusion capacity 50%. [employer] of chest  physicians questionnaire shows previous Crohn's disease and also mold exposure previously. Overall she feels stable  Regarding dyspnea: She underwent echocardiogram in June 2018 showed grade 1 diastolic dysfunction. Had abnormal cardiac stress test July 2018 followed by cardiac cath early August 2018 that showed normal coronary angiogram but elevated left ventricular end-diastolic pressure. Dietary therapy has been recommended but she is unaware of these results.  Overall she is here to discuss these results.  IMPRESSION: 1. Suspect very mild basilar predominant subpleural reticulation and bronchiolectasis, which may be due to nonspecific interstitial pneumonitis. 2. Aortic atherosclerosis (ICD10-170.0). Coronary artery calcification. 3. Enlarged pulmonary arteries, indicative of pulmonary arterial hypertension.   Electronically Signed   By: Lorin Picket M.D.   On: 05/13/2017 07:33   OV 10/17/2017  Chief Complaint  Patient presents with  . Follow-up    Pt states that she has had good days and bad days since last visit. States that when she was in [place] for a month, breathing was better; still became SOB but not as bad as in [place]. Pt's SOB is mainly on exertion. Denies any cough or CP.    Follow-up dyspnea that is multifactorial due to obesity, physical deconditioning and diastolic dysfunction Follow-up mild interstitial lung disease not otherwise specified  Last visit I started her on Lasix. I was only supposed to see herin 6 months or so. However she's had problems tolerating Lasix and potassium. It fluctuates between palpitations and hypertension. She feels the palpitations might be related to potassium depletion after taking Lasix. But she also tells me that the Lasix does help her dyspnea. At this point in time her condition is to see cardiologist. Off note she did send the email message asking these questions on 09/28/2017 made a reply that she tells me that she never got  the reply.   OV 02/13/2018  Chief Complaint  Patient presents with  . Follow-up    PFT done 02/05/18.  Pt states she has been doing good. Was sick in january but states she is doing better.  DME: [facility] 4L pulse    Follow-up dyspnea that is multifactorial due to obesity, physical deconditioning and diastolic dysfunction Follow-up mild interstitial lung disease not otherwise specified v concern   68 year old female with concern for interstitial lung disease.  Since her last visit she continues to do well.  She is attending pulmonary rehabilitation.  She feels better and less short of breath.  She uses oxygen with exertion at rehab saying she needs it.  She has upcoming travel in summer to [place] and wanted some flight information oxygen form filled out.  She is wondering if mesalamine that she took several years ago was the cause of interstitial lung disease or her Crohn's disease.  But overall she is stable.  Pulmonary function test shows stability and FVC since last 1 year with some improvement in DLCO.  High-resolution CT scan of the chest interpreted by thoracic radiology report suspected ILD.   OV 08/28/2018  Chief Complaint  Patient presents with  . Follow-up    PFT performed today.  Pt states she has had both good and bad days since last visit.  Pt denies any complaints of cough, SOB, or CP but states she is having some problems with congestion since she has been back from vacation. Pt has been having problems with getting O2 supplies taken care of with DME.    Follow-up dyspnea that is multifactorial due to obesity, physical deconditioning and diastolic dysfunction Follow-up concern for  mild interstitial lung disease not otherwise specified  Patty [name] - Presents for follow-up for the above issues. Her dyspnea significantly better following a 30 pound weight loss and continued rehabilitation. She uses oxygen with rehabilitation. Today walking desaturation test 185 feet 3 laps on  room air: She dropped to 87% on the second lap. But she is feeling better. She had spirometry today that shows improvement in FVC concomitant with weight loss but no change in diffusion capacity that reflects the presence of ongoing possible ILD.She continues to lose more weight she believes that she could lose another 30 pounds before the next visit.     OV 03/12/2019  Subjective:  Patient ID: Patty [name], female , DOB: 1951-05-11 , age 24 y.o. , MRN: [id number] , ADDRESS: [place] [id number]   03/12/2019 -   Chief Complaint  Patient presents with  . Follow-up    PFT performed today.  Pt states she has been doing good since last visit. States she still becomes SOB with exertion, and has phlegm in the mornings, postnasal drainage which has used flonase to help. Pt also uses POC as needed.    Follow-up dyspnea that is multifactorial due to obesity, physical deconditioning and diastolic dysfunction Follow-up  mild interstitial lung disease not otherwise specifieds -with stability 2018 through 2020 [non-IPF pattern]; on observation   HPI Verneta Hamidi 68 y.o. -presents for follow-up.  She is attending pulmonary rehabilitation and using oxygen with exertion.  Overall she feels stable.  Subjective symptom parameters and objective parameters are documented below.  She had high-resolution CT chest that showed shows that she has ILD.  At this point we can be confident that she has mild ILD but it is stable based on symptoms walking test and pulmonary function test.  The previous autoimmune test was negative.    OV 10/07/2019  Subjective:  Patient ID: [name], female , DOB: 04-23-1951 , age 54 y.o. , MRN: [id number] , ADDRESS: [place] [id number]   10/07/2019 -   Chief Complaint  Patient presents with  . Follow-up     Follow-up dyspnea that is multifactorial due to obesity, physical deconditioning and diastolic dysfunction Follow-up   mild interstitial lung disease not otherwise specifieds -with stability 2018 through 2020 [non-IPF pattern]; on observation Follow-up mild pulmonary emphysema present on high-resolution CT chest  HPI Kailie Polus 68 y.o. -presents for follow-up.  Prior visit was pre-pandemic.  Since then she has been sedentary at home.  She tells me that she is more short of breath.  The symptom scores below reflect that.  She thinks is because of the 30 pound weight gain because of sedentary living and social isolation following the onset of the pandemic.  She is frustrated by this.  Deep down she thinks her ILD is stable.  Pulmonary function test shows a drop in FVC but stability and DLCO suggesting weight gain is an ongoing issue for worsening dyspnea and pulmonary function test.  She is willing to have a high-dose flu shot today.  She also tells me that she is not taking her Spiriva because it was expensive.  Although it did help her she is willing to try again and try to price it.  She also stated that her mom who never smoked had emphysema but her dad smoked heavily.  She has never been tested for alpha-1.   G[place]ine -dropped. But tries not to use it in public   SYMPTOM SCALE - ILD 03/12/2019  10/07/2019 30# wt gain   O2 use RA ra  Shortness of Breath 0 -> 5 scale with 5 being worst (score 6 If unable to do)   At rest 0 0  Simple tasks - showers, clothes change, eating, shaving 0 0  Household (dishes, doing bed, laundry) 0 0  Shopping 0 0  Walking level at own pace 0 4.5  Walking keeping up with others of same age 55 4.5  Walking up Stairs 1-2 5  Walking up [place] 1-2 5  Total (40 - 48) Dyspnea Score 2 19   How bad is your cough? 0 0  How bad is your fatigue 0 1        Simple office walk 185 feet x  3 laps goal with forehead probe 03/12/2019  10/07/2019   O2 used Room air Room air  Number laps completed 3 2nd lap  Comments about pace slow slow  Resting Pulse Ox/HR 100% and 85/min  96% and 72/min  Final Pulse Ox/HR 88% and 135/min 86% and 105/min  Desaturated </= 88% yes   Desaturated <= 3% points Yes, 12   Got Tachycardic >/= 90/min yes   Symptoms at end of test Mild dyspnea   Miscellaneous comments x       Results for SHRINIKA, BLATZ (MRN [id number]) as of 03/12/2019 16:21  Ref. Range 05/17/2017 13:11 02/05/2018 12:14 08/28/2018 11:22 03/12/2019 14:47 10/07/2019 30# weight gain  FVC-Pre Latest Units: L 2.61 2.57 2.80 2.73 2.38  FVC-%Pred-Pre Latest Units: % 84 83 94 91 81%    Results for KEIERA, STRATHMAN (MRN [id number]) as of 03/12/2019 16:21  Ref. Range 05/17/2017 13:11 02/05/2018 12:14 08/28/2018 11:22 03/12/2019 14:47 10/07/2019   DLCO unc Latest Units: ml/min/mmHg 12.64 15.15 14.53 13.77 14.39  DLCO unc % pred Latest Units: % 52 62 62 72 75%   IMPRESSION: 1. Very mild scattered basilar subpleural reticulation and ground-glass similar to 01/29/2018. Nonspecific interstitial pneumonitis can not be excluded. Findings are indeterminate for UIP per consensus guidelines: Diagnosis of Idiopathic Pulmonary Fibrosis: An Official ATS/ERS/JRS/ALAT Clinical Practice Guideline. [place], Iss 5, [id number], Aug 26 2017. 2. Question cirrhosis. 3. Aortic atherosclerosis (ICD10-170.0). Coronary artery calcification. 4. Enlarged arteries, arterial hypertension. 5.  Emphysema (ICD10-J43.9).   Electronically Signed   By: Lorin Picket M.D.   On: 03/06/2019 16:59   ROS - per HPI     has a past medical history of Abnormal nuclear stress test, Amyloidosis ([place]), Crohn disease ([place]), Crohn's colitis ([place]), DVT (deep venous thrombosis) ([place]), Fracture, Grade I diastolic dysfunction, ILD (interstitial lung disease) ([place]), OSA on CPAP, Pernicious anemia, Polycythemia, Polycythemia vera ([place]), Pulmonary embolism ([place]) (2010), Sinus problem, and Type 2 diabetes mellitus ([place]).   reports that she quit smoking about 10 years ago. Her smoking use included  cigarettes. She has a 45.00 pack-year smoking history. She has never used smokeless tobacco.  Past Surgical History:  Procedure Laterality Date  . BIOPSY  09/30/2019   Procedure: BIOPSY;  Surgeon: Jerene Bears, MD;  Location: [facility];  Service: Gastroenterology;;  . [place]  . COLONOSCOPY WITH  PROPOFOL N/A 09/30/2019   Procedure: COLONOSCOPY WITH PROPOFOL;  Surgeon: Jerene Bears, MD;  Location: [facility];  Service: Gastroenterology;  Laterality: N/A;  . HIP ARTHROPLASTY Right    x 2, initial right THA, then subsequent right THA revision   . LEFT HEART CATH AND CORONARY ANGIOGRAPHY N/A 07/28/2017   Procedure: Left Heart Cath and Coronary Angiography;  Surgeon: Burnell Blanks, MD;  Location: [facility];  Service: Cardiovascular;  Laterality: N/A;  . POLYPECTOMY  09/30/2019   Procedure: POLYPECTOMY;  Surgeon: Jerene Bears, MD;  Location: [facility];  Service: Gastroenterology;;    Allergies  Allergen Reactions  . Victoza [Liraglutide] Shortness Of Breath  . Lasix [Furosemide] Itching    Hypersensitivity   . Metformin And Related Diarrhea    Increased moodiness  . Onion Other (See Comments)    sereve diarrhea, stomach pains (Crohn's flares)  . Oxycodone Other (See Comments)    Altered mental status  . Potassium-Containing Compounds Other (See Comments)    Due to chron's, difficult passing though kidneys not allowing absorption in the body   . Prednisone Other (See Comments)    Altered mood  . Sulfa Drugs Cross Reactors Other (See Comments)    "feels like bugs crawling on me"    Immunization History  Administered Date(s) Administered  . Influenza, High Dose Seasonal PF 08/28/2018  . Influenza-Unspecified 11/23/2017  . Pneumococcal Conjugate-13 02/13/2018  . Pneumococcal Polysaccharide-23 03/12/2019    Family History  Problem Relation Age of Onset  . Glaucoma Mother   . High blood pressure Mother   . High Cholesterol Mother   . Sleep  apnea Mother   . Diabetes Mellitus II Father   . Sleep apnea Father   . Anxiety disorder Father   . COPD Brother   . Heart disease Brother   . Non-Hodgkin's lymphoma Brother   . Diabetes Mellitus II Brother   . Glaucoma Brother   . Colon cancer Neg Hx   . Esophageal cancer Neg Hx   . Pancreatic cancer Neg Hx   . Stomach cancer Neg Hx   . Liver disease Neg Hx      Current Outpatient Medications:  .  aspirin EC 81 MG tablet, Take 81 mg by mouth daily., Disp: , Rfl:  .  B Complex-C (B-COMPLEX WITH VITAMIN C) tablet, Take 1 tablet by mouth once a week., Disp: , Rfl:  .  Biotin 5000 MCG SUBL, Place 5,000 mcg under the tongue daily., Disp: , Rfl:  .  blood glucose meter kit and supplies KIT, Dispense based on patient and insurance preference. Use up to twice daily as directed. (FOR ICD-9 250.00, 250.01)., Disp: 1 each, Rfl: 0 .  CANNABIDIOL PO, Take 1 Dose by mouth 2 (two) times daily as needed (anxiety/sleep.). CBD Oil, Disp: , Rfl:  .  cetirizine (ZYRTEC) 10 MG tablet, Take 10 mg by mouth daily as needed (sinus/allergies.)., Disp: , Rfl:  .  cetirizine-pseudoephedrine (ZYRTEC-D) 5-120 MG tablet, Take 1 tablet by mouth daily as needed (sinus headaches.)., Disp: , Rfl:  .  Cholecalciferol (VITAMIN D3) 50 MCG (2000 UT) TABS, Take 2,000 Units by mouth daily., Disp: , Rfl:  .  clonazePAM (KLONOPIN) 0.5 MG tablet, Take 0.5-1 tablets (0.25-0.5 mg total) by mouth daily as needed (for anxiety or sleep.)., Disp: 90 tablet, Rfl: 0 .  cyclopentolate (CYCLODRYL,CYCLOGYL) 2 % ophthalmic solution, Place 1 drop into both eyes 3 (three) times daily as needed (for uveitis or iritis)., Disp: , Rfl:  .  FLUoxetine (PROZAC) 10 MG capsule, Take 1 capsule (10 mg total) by mouth daily., Disp: 90 capsule, Rfl: 0 .  fluticasone (FLONASE) 50 MCG/ACT nasal spray, Place 1-2 sprays into both nostrils daily as needed for allergies or rhinitis., Disp: , Rfl:  .  glucose blood (ONETOUCH VERIO) test strip, USE UP TO TWICE  DAILY AS DIRECTED, Disp: 100 strip, Rfl: 0 .  Glycerin-Polysorbate 80 (REFRESH DRY EYE THERAPY OP), Place 1 drop into both eyes 3 (three) times daily as needed (dry/irritated eyes.)., Disp: , Rfl:  .  hydrocortisone (PROCTOZONE-HC) 2.5 % rectal cream, Place 1 application rectally 2 (two) times daily as needed for hemorrhoids or anal itching. (Patient taking differently: Place 1 application rectally 2 (two) times daily as needed for hemorrhoids or anal itching. Crohn's disease), Disp: 30 g, Rfl: 1 .  ibuprofen (ADVIL) 200 MG tablet, Take 400-800 mg by mouth every 8 (eight) hours as needed (pain.)., Disp: , Rfl:  .  Lancets (ONETOUCH DELICA PLUS [id number]) MISC, 1 each by Does not apply route 2 (two) times daily., Disp: 180 each, Rfl: 3 .  MAGNESIUM PO, Take 1 tablet by mouth once a week., Disp: , Rfl:  .  mesalamine (PENTASA) 250 MG CR capsule, Take 500 mg by mouth 4 (four) times daily as needed (crohn's disease flare ups). , Disp: , Rfl:  .  ONETOUCH VERIO test strip, 1 EACH BY OTHER ROUTE 2 (TWO) TIMES A DAY., Disp: 100 strip, Rfl: 0 .  prednisoLONE acetate (PRED FORTE) 1 % ophthalmic suspension, Place 1 drop into both eyes 2 (two) times daily as needed (Uveitis)., Disp: , Rfl:  .  Probiotic Product (PROBIOTIC PO), Take 1 capsule by mouth daily. , Disp: , Rfl:  .  topiramate (TOPAMAX) 50 MG tablet, Take 1 tablet (50 mg total) by mouth daily. (Patient taking differently: Take 25-50 mg by mouth at bedtime as needed (sleep.). ), Disp: 90 tablet, Rfl: 0 .  traMADol-acetaminophen (ULTRACET) 37.5-325 MG tablet, Take 0.5-1 tablets by mouth every 6 (six) hours as needed (for pain.)., Disp: , Rfl:       Objective:   Vitals:   10/07/19 1113  BP: 118/80  Pulse: 90  Temp: (!) 97.3 F (36.3 C)  TempSrc: Oral  SpO2: 91%  Weight: 251 lb (113.9 kg)  Height: 5' 4" (1.626 m)    Estimated body mass index is 43.08 kg/m as calculated from the following:   Height as of this encounter: 5' 4" (1.626 m).    Weight as of this encounter: 251 lb (113.9 kg).  _0 @  Filed Weights   10/07/19 1113  Weight: 251 lb (113.9 kg)     Physical Exam  General Appearance:    Alert, cooperative, no distress, appears stated age - yes , Deconditioned looking - yes mild , OBESE  - yes, Sitting on Wheelchair -  no  Head:    Normocephalic, without obvious abnormality, atraumatic  Eyes:    PERRL, conjunctiva/corneas clear,  Ears:    Normal TM's and external ear canals, both ears  Nose:   Nares normal, septum midline, mucosa normal, no drainage    or sinus tenderness. OXYGEN ON  - no . Patient is @ ra   Throat:   Lips, mucosa, and tongue normal; teeth and gums normal. Cyanosis on lips - no  Neck:   Supple, symmetrical, trachea midline, no adenopathy;    thyroid:  no enlargement/tenderness/nodules; no carotid   bruit or JVD  Back:     Symmetric, no  curvature, ROM normal, no CVA tenderness  Lungs:     Distress - no , Wheeze no, Barrell Chest - no, Purse lip breathing - no, Crackles - yes at base   Chest Wall:    No tenderness or deformity.    Heart:    Regular rate and rhythm, S1 and S2 normal, no rub   or gallop, Murmur - no  Breast Exam:    NOT DONE  Abdomen:     Soft, non-tender, bowel sounds active all four quadrants,    no masses, no organomegaly. Visceral obesity - yes  Genitalia:   NOT DONE  Rectal:   NOT DONE  Extremities:   Extremities - normal, Has Cane - no, Clubbing - no, Edema - no  Pulses:   2+ and symmetric all extremities  Skin:   Stigmata of Connective Tissue Disease - no  Lymph nodes:   Cervical, supraclavicular, and axillary nodes normal  Psychiatric:  Neurologic:   Pleasant - yes, Anxious - no, Flat affect - no  CAm-ICU - neg, Alert and Oriented x 3 - yes, Moves all 4s - yes, Speech - normal, Cognition - intact           Assessment:       ICD-10-CM   1. Pulmonary emphysema with fibrosis of lung ([place])  J43.9    J84.10   2. ILD (interstitial lung disease) ([place])  J84.9    3. Pulmonary emphysema, unspecified emphysema type ([place])  J43.9        Plan:     Patient Instructions  Pulmonary emphysema with fibrosis of lung ([place])  - continue o2 with exertion and exercise  - have high dose flu shot 10/07/2019   ILD (interstitial lung disease) ([place])  - does not appear to be IPF or Autoimmune - seems to be a non-progressive variety called NSIP - PFt worsening is probably due to weight gain - given stability will continue to monitor and if gets worse can consider anti-fibrotic  Pulmonary emphysema, unspecified emphysema type ([place])  - re- start spiriva respimat scheduled - take 2 samples and then price it  - if the help is significant you can call and we can give you prescrioption  -otherwise do albuterol as needed - check alpha 1 AT 10/07/2019 given family history  Followup 6 months do spirometry/dlco Return to regular clinic in  6 months    > 50% of this > 25 min visit spent in face to face counseling or coordination of care - by this undersigned MD - Dr Brand Males. This includes one or more of the following documented above: discussion of test results, diagnostic or treatment recommendations, prognosis, risks and benefits of management options, instructions, education, compliance or risk-factor reduction    SIGNATURE    Dr. Brand Males, M.D., F.C.C.P,  Pulmonary and Critical Care Medicine Staff Physician, [place] Director - Interstitial Lung Disease  Program  Pulmonary [place] [facility] [place], [place], [address]  Pager: [phone], If no answer or between  15:00h - 7:00h: call [phone] Telephone: [phone]  11:56 AM 10/07/2019

## 2019-10-10 ENCOUNTER — Encounter: Payer: Self-pay | Admitting: Family Medicine

## 2019-10-10 DIAGNOSIS — E119 Type 2 diabetes mellitus without complications: Secondary | ICD-10-CM

## 2019-10-11 NOTE — Telephone Encounter (Signed)
MR - please advise. Thanks.

## 2019-10-14 ENCOUNTER — Encounter: Payer: Self-pay | Admitting: Family Medicine

## 2019-10-14 ENCOUNTER — Other Ambulatory Visit: Payer: Medicare Other

## 2019-10-14 ENCOUNTER — Other Ambulatory Visit: Payer: Self-pay

## 2019-10-14 DIAGNOSIS — R635 Abnormal weight gain: Secondary | ICD-10-CM

## 2019-10-15 ENCOUNTER — Encounter: Payer: Self-pay | Admitting: Family Medicine

## 2019-10-15 LAB — ALPHA-1 ANTITRYPSIN PHENOTYPE: A-1 Antitrypsin, Ser: 153 mg/dL (ref 83–199)

## 2019-10-15 NOTE — Telephone Encounter (Signed)
Mendel Ryder  You might have interpreted a communication with [name] and Dr Juleen China in [place]. I do not have any documentation of discussing antibiptics with patient at current recent visit

## 2019-10-15 NOTE — Telephone Encounter (Signed)
Duplicate

## 2019-10-15 NOTE — Telephone Encounter (Signed)
[name], I am sending you this message to follow up.

## 2019-10-15 NOTE — Telephone Encounter (Signed)
Dr. Chase Caller,  On 10/07/19 at OV, Amoxicillin 550m take 1 tab TID, #15, no refills was [facility] to [facility] under your name.

## 2019-10-15 NOTE — Telephone Encounter (Signed)
Dr. Chase Caller,       It appears that yes the staff member working with you that day, [name], put in verbal order for the amoxicillin on 10/07/2019 at 1219 and you cosigned it at 1302.  How would you like [name] to advise the patient?  Thank you!

## 2019-10-15 NOTE — Telephone Encounter (Signed)
I do not recolllect doing it. There is no documentation on my OV about it. So, I am not sure who sent it under my name. Unless as I was leaving she asked for a refill and the CMA sent it +/- getting my verbal approval and this was not documented. I just do not remember saying ok to an amox and is not documented. Please advise who in our office sent it. Piossible I signed it too and was not aware of the many verbals being sent   On 10/10/2019 she initiated a conversation about it with her PCP Briscoe Deutscher, DO about wanting refills for amox v augmentin for her dental issues  So, if she is confused about amox v agumenting - she should talk to PCP Briscoe Deutscher, DO or her dentist.

## 2019-10-16 ENCOUNTER — Other Ambulatory Visit: Payer: Self-pay | Admitting: Family Medicine

## 2019-10-16 NOTE — Telephone Encounter (Signed)
Requested medication (s) are due for refill today: no  Requested medication (s) are on the active medication list: yes  Last refill:  10/07/2019  Future visit scheduled:no  Notes to clinic:  Patient requesting refill on Amoxicillin and test strips and lancets went to wrong pharmacy   Requested Prescriptions  Pending Prescriptions Disp Refills   amoxicillin (AMOXIL) 500 MG tablet 15 tablet 0    Sig: Take 1 tablet (500 mg total) by mouth 3 (three) times daily.     Off-Protocol Failed - 10/16/2019  1:22 PM      Failed - Medication not assigned to a protocol, review manually.      Passed - Valid encounter within last 12 months    Recent Outpatient Visits          2 months ago Type 2 diabetes mellitus without complication, without long-term current use of insulin ([place])   [place] Wallace, Blanchard, DO              Lancets (ONETOUCH DELICA PLUS [id number]) [place] 180 each 3    Sig: 1 each by Does not apply route 2 (two) times daily.     There is no refill protocol information for this order     glucose blood (ONETOUCH VERIO) test strip 100 strip 0    Sig: USE UP TO TWICE DAILY AS DIRECTED     There is no refill protocol information for this order

## 2019-10-16 NOTE — Telephone Encounter (Signed)
See note °

## 2019-10-16 NOTE — Telephone Encounter (Signed)
Thanks you and yes [name] remembered her conversation with me. Suspect I forgot to document it. My apologies. I have asked [name] to call the patient and see if we in pulmonary really need to do this and if this is something if for dental or other reasons can be done via her dentist/pcp,. If so, then cancel the order we sent. This would be my preference unless there is a pulmonary reason

## 2019-10-16 NOTE — Telephone Encounter (Signed)
Copied from [place] [phone] [phone]. Topic: Quick Communication - Rx Refill/Question >> Oct 16, 2019  1:08 PM Mcneil, Ja-Kwan wrote: Medication: amoxicillin (AMOXIL) 500 MG tablet, Lancets (ONETOUCH DELICA PLUS [id number]) MISC, and ONETOUCH VERIO test strip  Has the patient contacted their pharmacy? yes   Preferred Pharmacy (with phone number or street name): [id number] [place], [place]- [phone](Phone)  [phone](Fax)  Agent: Please be advised that RX refills may take up to 3 business days. We ask that you follow-up with your pharmacy.

## 2019-10-17 MED ORDER — CLONAZEPAM 0.5 MG PO TABS: 0.2500 mg | ORAL_TABLET | Freq: Every day | ORAL | 0 refills | Status: DC | PRN

## 2019-10-17 MED ORDER — ONETOUCH VERIO VI STRP: ORAL_STRIP | 0 refills | Status: DC

## 2019-10-17 MED ORDER — AMOXICILLIN 500 MG PO CAPS: 500.0000 mg | ORAL_CAPSULE | Freq: Three times a day (TID) | ORAL | 0 refills | Status: DC

## 2019-10-17 MED ORDER — ONETOUCH DELICA PLUS LANCET33G MISC: 1.0000 | Freq: Two times a day (BID) | 3 refills | Status: DC

## 2019-10-17 NOTE — Telephone Encounter (Signed)
Have sent everything to local this just needs to be sent to mail order

## 2019-10-18 ENCOUNTER — Encounter: Payer: Self-pay | Admitting: Internal Medicine

## 2019-10-21 ENCOUNTER — Other Ambulatory Visit: Payer: Self-pay

## 2019-10-22 LAB — EXTRA URINE SPECIMEN

## 2019-10-22 LAB — CORTISOL, URINE, 24 HOUR
24 Hour urine volume (VMAHVA): 2850 mL
Cortisol (Ur), Free: 46.1 mcg/24 h (ref 4.0–50.0)
RESULTS RECEIVED: 1.3 g/(24.h) (ref 0.50–2.15)

## 2019-10-23 ENCOUNTER — Encounter (INDEPENDENT_AMBULATORY_CARE_PROVIDER_SITE_OTHER): Payer: Self-pay | Admitting: Family Medicine

## 2019-10-24 ENCOUNTER — Other Ambulatory Visit: Payer: Self-pay

## 2019-10-24 ENCOUNTER — Ambulatory Visit (INDEPENDENT_AMBULATORY_CARE_PROVIDER_SITE_OTHER): Payer: Medicare Other | Admitting: Family Medicine

## 2019-10-24 ENCOUNTER — Encounter (INDEPENDENT_AMBULATORY_CARE_PROVIDER_SITE_OTHER): Payer: Self-pay | Admitting: Family Medicine

## 2019-10-24 VITALS — BP 95/66 | HR 86 | Temp 97.9°F | Ht 63.0 in | Wt 246.0 lb

## 2019-10-24 DIAGNOSIS — Z6841 Body Mass Index (BMI) 40.0 and over, adult: Secondary | ICD-10-CM | POA: Diagnosis not present

## 2019-10-24 DIAGNOSIS — R0602 Shortness of breath: Secondary | ICD-10-CM | POA: Diagnosis not present

## 2019-10-24 DIAGNOSIS — E66813 Obesity, class 3: Secondary | ICD-10-CM

## 2019-10-24 NOTE — Telephone Encounter (Signed)
FYI/ appt today

## 2019-10-28 NOTE — Progress Notes (Signed)
Office: [phone]  /  Fax: [phone]   HPI:   Chief Complaint: OBESITY Patty [name] is here to discuss her progress with her obesity treatment plan. She is on the portion control better and make smarter food choices, such as increase vegetables and decrease simple carbohydrates and is following her eating plan approximately 85-90 % of the time. She states she is walking for 15-30 minutes 5 times per week. Patty [name] states she is journaling most of the time, but continues to gain weight. She saw an Endocrinologist who repeated her cortisol levels, as she still feels she has a high cortisol. She also wonders if it is related to her adrenal glands which were normal on her CT scan in the past. Her weight is 246 lb (111.6 kg) today and has gained 19 lbs since her last visit. She has lost 5 lbs since starting treatment with [name].  Shortness of Breath with Exertion Patty [name] notes increasing shortness of breath with exercising, especially with weight gain. She is followed by Pulmonary. She notes getting out of breath sooner with activity than she used to. Patty [name] denies shortness of breath at rest or orthopnea.  ASSESSMENT AND PLAN:  Shortness of breath on exertion  Class 3 severe obesity with serious comorbidity and body mass index (BMI) of 40.0 to 44.9 in adult, unspecified obesity type (HCC)  PLAN:  Shortness of Breath with Exertion Patty [name]'s shortness of breath appears to be obesity related and exercise induced. Repeated IC shows RMR has increased which is contributing to her weight gain. The indirect calorimeter results showed VO2 of 199 and a REE of 1387. She has agreed to continue working on weight loss and gradually increase exercise to treat her exercise induced shortness of breath. Patty [name] agrees to follow up with ur clinic in 5 to 6 weeks.  I spent > than 50% of the 25 minute visit on counseling as documented in the note.  Obesity Patty [name] is currently in the action stage of change. As such, her  goal is to continue with weight loss efforts She has agreed to keep a food journal with 1200-1300 calories and 85+ grams of protein daily Patty [name] has been instructed to work up to a goal of 150 minutes of combined cardio and strengthening exercise per week for weight loss and overall health benefits. We discussed the following Behavioral Modification Strategies today: planning for success and keep a strict food journal Patty [name] repeat IC shows her RMR has decreased significantly. She is to adjust her calories and will follow up and plan to repeat IC in January.  Patty [name] has agreed to follow up with our clinic in 5 to 6 weeks. She was informed of the importance of frequent follow up visits to maximize her success with intensive lifestyle modifications for her multiple health conditions.  ALLERGIES: Allergies  Allergen Reactions  . Victoza [Liraglutide] Shortness Of Breath  . Lasix [Furosemide] Itching    Hypersensitivity   . Metformin And Related Diarrhea    Increased moodiness  . Onion Other (See Comments)    sereve diarrhea, stomach pains (Crohn's flares)  . Oxycodone Other (See Comments)    Altered mental status  . Potassium-Containing Compounds Other (See Comments)    Due to chron's, difficult passing though kidneys not allowing absorption in the body   . Prednisone Other (See Comments)    Altered mood  . Sulfa Drugs Cross Reactors Other (See Comments)    "feels like bugs crawling on me"    MEDICATIONS: Current Outpatient Medications on  File Prior to Visit  Medication Sig Dispense Refill  . amoxicillin (AMOXIL) 500 MG capsule Take 1 capsule (500 mg total) by mouth 3 (three) times daily. 30 capsule 0  . amoxicillin (AMOXIL) 500 MG tablet Take 1 tablet (500 mg total) by mouth 3 (three) times daily. 15 tablet 0  . aspirin EC 81 MG tablet Take 81 mg by mouth daily.    . B Complex-C (B-COMPLEX WITH VITAMIN C) tablet Take 1 tablet by mouth once a week.    . Biotin 5000 MCG SUBL Place 5,000  mcg under the tongue daily.    . blood glucose meter kit and supplies KIT Dispense based on patient and insurance preference. Use up to twice daily as directed. (FOR ICD-9 250.00, 250.01). 1 each 0  . CANNABIDIOL PO Take 1 Dose by mouth 2 (two) times daily as needed (anxiety/sleep.). CBD Oil    . cetirizine (ZYRTEC) 10 MG tablet Take 10 mg by mouth daily as needed (sinus/allergies.).    [name] cetirizine-pseudoephedrine (ZYRTEC-D) 5-120 MG tablet Take 1 tablet by mouth daily as needed (sinus headaches.).    [name] Cholecalciferol (VITAMIN D3) 50 MCG (2000 UT) TABS Take 2,000 Units by mouth daily.    . clonazePAM (KLONOPIN) 0.5 MG tablet Take 0.5-1 tablets (0.25-0.5 mg total) by mouth daily as needed (for anxiety or sleep.). 90 tablet 0  . cyclopentolate (CYCLODRYL,CYCLOGYL) 2 % ophthalmic solution Place 1 drop into both eyes 3 (three) times daily as needed (for uveitis or iritis).    [name] FLUoxetine (PROZAC) 10 MG capsule Take 1 capsule (10 mg total) by mouth daily. 90 capsule 1  . fluticasone (FLONASE) 50 MCG/ACT nasal spray Place 1-2 sprays into both nostrils daily as needed for allergies or rhinitis.    [name] glucose blood (ONETOUCH VERIO) test strip USE UP TO TWICE DAILY AS DIRECTED 100 strip 0  . glucose blood (ONETOUCH VERIO) test strip 1 EACH BY OTHER ROUTE 2 (TWO) TIMES A DAY. 100 strip 0  . Glycerin-Polysorbate 80 (REFRESH DRY EYE THERAPY OP) Place 1 drop into both eyes 3 (three) times daily as needed (dry/irritated eyes.).    [name] hydrocortisone (PROCTOZONE-HC) 2.5 % rectal cream Place 1 application rectally 2 (two) times daily as needed for hemorrhoids or anal itching. (Patient taking differently: Place 1 application rectally 2 (two) times daily as needed for hemorrhoids or anal itching. Crohn's disease) 30 g 1  . ibuprofen (ADVIL) 200 MG tablet Take 400-800 mg by mouth every 8 (eight) hours as needed (pain.).    [name] Lancets (ONETOUCH DELICA PLUS [id number]) MISC 1 each by Does not apply route 2 (two) times  daily. 180 each 3  . MAGNESIUM PO Take 1 tablet by mouth once a week.    . mesalamine (PENTASA) 250 MG CR capsule Take 500 mg by mouth 4 (four) times daily as needed (crohn's disease flare ups).     . prednisoLONE acetate (PRED FORTE) 1 % ophthalmic suspension Place 1 drop into both eyes 2 (two) times daily as needed (Uveitis).    . Probiotic Product (PROBIOTIC PO) Take 1 capsule by mouth daily.     . Tiotropium Bromide Monohydrate (SPIRIVA RESPIMAT) 1.25 MCG/ACT AERS Inhale 2 puffs into the lungs daily. 4 g 0  . Tiotropium Bromide Monohydrate (SPIRIVA RESPIMAT) 1.25 MCG/ACT AERS Inhale 2 puffs into the lungs daily. 4 g 0  . topiramate (TOPAMAX) 50 MG tablet Take 1 tablet (50 mg total) by mouth daily. 90 tablet 1  . traMADol-acetaminophen (ULTRACET) 37.5-325 MG  tablet Take 0.5-1 tablets by mouth every 6 (six) hours as needed (for pain.).     No current facility-administered medications on file prior to visit.     PAST MEDICAL HISTORY: Past Medical History:  Diagnosis Date  . Abnormal nuclear stress test    a. 07/2017: cath with no CAD  . Amyloidosis ([place])   . Crohn disease ([place])   . Crohn's colitis ([place])   . DVT (deep venous thrombosis) ([place])   . Fracture    pelvic fracture   . Grade I diastolic dysfunction   . ILD (interstitial lung disease) ([place])   . OSA on CPAP   . Pernicious anemia   . Polycythemia   . Polycythemia vera ([place])   . Pulmonary embolism ([place]) 2010   s/p hip surgery , reports this was never truly confirmed   . Sinus problem   . Type 2 diabetes mellitus ([place])    diet controlled    PAST SURGICAL HISTORY: Past Surgical History:  Procedure Laterality Date  . BIOPSY  09/30/2019   Procedure: BIOPSY;  Surgeon: Jerene Bears, MD;  Location: [facility];  Service: Gastroenterology;;  . [place]  . COLONOSCOPY WITH PROPOFOL N/A 09/30/2019   Procedure: COLONOSCOPY WITH PROPOFOL;  Surgeon: Jerene Bears, MD;  Location: [facility];  Service:  Gastroenterology;  Laterality: N/A;  . HIP ARTHROPLASTY Right    x 2, initial right THA, then subsequent right THA revision   . LEFT HEART CATH AND CORONARY ANGIOGRAPHY N/A 07/28/2017   Procedure: Left Heart Cath and Coronary Angiography;  Surgeon: Burnell Blanks, MD;  Location: [facility];  Service: Cardiovascular;  Laterality: N/A;  . POLYPECTOMY  09/30/2019   Procedure: POLYPECTOMY;  Surgeon: Jerene Bears, MD;  Location: [facility];  Service: Gastroenterology;;    SOCIAL HISTORY: Social History   Tobacco Use  . Smoking status: Former Smoker    Packs/day: 1.00    Years: 45.00    Pack years: 45.00    Types: Cigarettes    Quit date: 12/26/2008    Years since quitting: 10.8  . Smokeless tobacco: Never Used  Substance Use Topics  . Alcohol use: Yes    Comment: occ  . Drug use: No    FAMILY HISTORY: Family History  Problem Relation Age of Onset  . Glaucoma Mother   . High blood pressure Mother   . High Cholesterol Mother   . Sleep apnea Mother   . Diabetes Mellitus II Father   . Sleep apnea Father   . Anxiety disorder Father   . COPD Brother   . Heart disease Brother   . Non-Hodgkin's lymphoma Brother   . Diabetes Mellitus II Brother   . Glaucoma Brother   . Colon cancer Neg Hx   . Esophageal cancer Neg Hx   . Pancreatic cancer Neg Hx   . Stomach cancer Neg Hx   . Liver disease Neg Hx     ROS: Review of Systems  Constitutional: Negative for weight loss.  Respiratory: Positive for shortness of breath (with exertion).   Cardiovascular: Negative for orthopnea.    PHYSICAL EXAM: Blood pressure 95/66, pulse 86, temperature 97.9 F (36.6 C), temperature source Oral, height _0  (1.6 m), weight 246 lb (111.6 kg), SpO2 96 %. Body mass index is 43.58 kg/m. Physical Exam Vitals signs reviewed.  Constitutional:      Appearance: Normal appearance. She is obese.  Cardiovascular:     Rate and Rhythm: Normal rate.  Pulses: Normal pulses.   Pulmonary:     Effort: Pulmonary effort is normal.     Breath sounds: Normal breath sounds.  Musculoskeletal: Normal range of motion.  Skin:    General: Skin is warm and dry.  Neurological:     Mental Status: She is alert and oriented to person, place, and time.  Psychiatric:        Mood and Affect: Mood normal.        Behavior: Behavior normal.     RECENT LABS AND TESTS: BMET    Component Value Date/Time   NA 141 01/21/2019 1321   K 4.4 01/21/2019 1321   CL 103 01/21/2019 1321   CO2 23 01/21/2019 1321   GLUCOSE 101 (H) 01/21/2019 1321   GLUCOSE 119 (H) 07/29/2017 0553   BUN 22 08/09/2019 1503   BUN 21 01/21/2019 1321   CREATININE 0.81 08/09/2019 1503   CALCIUM 9.4 01/21/2019 1321   GFRNONAA 82 01/21/2019 1321   GFRAA 95 01/21/2019 1321   Lab Results  Component Value Date   HGBA1C 6.0 (A) 09/24/2019   HGBA1C 5.9 (H) 01/21/2019   HGBA1C 5.9 (H) 08/07/2018   HGBA1C 6.6 (H) 04/05/2018   Lab Results  Component Value Date   INSULIN 23.6 01/21/2019   INSULIN 16.4 08/07/2018   INSULIN 30.6 (H) 04/05/2018   CBC    Component Value Date/Time   WBC 6.8 01/21/2019 1321   RBC 4.87 01/21/2019 1321   HGB 14.4 01/21/2019 1321   HCT 44.0 01/21/2019 1321   PLT 342 07/25/2017 1544   MCV 90 01/21/2019 1321   MCH 29.6 01/21/2019 1321   MCHC 32.7 01/21/2019 1321   RDW 14.4 01/21/2019 1321   LYMPHSABS 1.6 01/21/2019 1321   EOSABS 0.1 01/21/2019 1321   BASOSABS 0.0 01/21/2019 1321   Iron/TIBC/Ferritin/ %Sat    Component Value Date/Time   IRON 111 04/05/2018 1009   TIBC 311 04/05/2018 1009   FERRITIN 71 04/05/2018 1009   IRONPCTSAT 36 04/05/2018 1009   Lipid Panel     Component Value Date/Time   CHOL 178 01/21/2019 1321   TRIG 74 01/21/2019 1321   HDL 61 01/21/2019 1321   LDLCALC 102 (H) 01/21/2019 1321   Hepatic Function Panel     Component Value Date/Time   PROT 7.0 01/21/2019 1321   ALBUMIN 4.2 01/21/2019 1321   AST 12 01/21/2019 1321   ALT 20 01/21/2019  1321   ALKPHOS 79 01/21/2019 1321   BILITOT 0.5 01/21/2019 1321      Component Value Date/Time   TSH 0.445 (L) 01/21/2019 1321   TSH 0.821 04/05/2018 1009      OBESITY BEHAVIORAL INTERVENTION VISIT  Today's visit was # 17   Starting weight: 251 lbs Starting date: 04/05/18 Today's weight : 246 lbs  Today's date: 10/24/2019 Total lbs lost to date: 5    ASK: We discussed the diagnosis of obesity with Patty [name] today and Patty [name] agreed to give [name] permission to discuss obesity behavioral modification therapy today.  ASSESS: Patty [name] has the diagnosis of obesity and her BMI today is 43.59 Lien is in the action stage of change   ADVISE: Patty [name] was educated on the multiple health risks of obesity as well as the benefit of weight loss to improve her health. She was advised of the need for long term treatment and the importance of lifestyle modifications to improve her current health and to decrease her risk of future health problems.  AGREE: Multiple dietary modification options  and treatment options were discussed and  Patty [name] agreed to follow the recommendations documented in the above note.  ARRANGE: Patty [name] was educated on the importance of frequent visits to treat obesity as outlined per CMS and USPSTF guidelines and agreed to schedule her next follow up appointment today.  I, Trixie Dredge, am acting as transcriptionist for Dennard Nip, MD I have reviewed the above documentation for accuracy and completeness, and I agree with the above. -Dennard Nip, MD

## 2019-11-04 DIAGNOSIS — H2511 Age-related nuclear cataract, right eye: Secondary | ICD-10-CM | POA: Diagnosis not present

## 2019-11-04 DIAGNOSIS — H2513 Age-related nuclear cataract, bilateral: Secondary | ICD-10-CM | POA: Diagnosis not present

## 2019-11-04 DIAGNOSIS — H353132 Nonexudative age-related macular degeneration, bilateral, intermediate dry stage: Secondary | ICD-10-CM | POA: Diagnosis not present

## 2019-11-04 DIAGNOSIS — H25013 Cortical age-related cataract, bilateral: Secondary | ICD-10-CM | POA: Diagnosis not present

## 2019-11-04 DIAGNOSIS — H40013 Open angle with borderline findings, low risk, bilateral: Secondary | ICD-10-CM | POA: Diagnosis not present

## 2019-11-04 LAB — HM DIABETES EYE EXAM

## 2019-11-08 ENCOUNTER — Encounter: Payer: Self-pay | Admitting: Family Medicine

## 2019-11-19 DIAGNOSIS — H25811 Combined forms of age-related cataract, right eye: Secondary | ICD-10-CM | POA: Diagnosis not present

## 2019-11-19 DIAGNOSIS — H2511 Age-related nuclear cataract, right eye: Secondary | ICD-10-CM | POA: Diagnosis not present

## 2019-11-27 ENCOUNTER — Encounter (INDEPENDENT_AMBULATORY_CARE_PROVIDER_SITE_OTHER): Payer: Self-pay

## 2019-11-28 ENCOUNTER — Other Ambulatory Visit: Payer: Self-pay

## 2019-11-28 ENCOUNTER — Encounter (INDEPENDENT_AMBULATORY_CARE_PROVIDER_SITE_OTHER): Payer: Self-pay | Admitting: Family Medicine

## 2019-11-28 ENCOUNTER — Ambulatory Visit (INDEPENDENT_AMBULATORY_CARE_PROVIDER_SITE_OTHER): Payer: Medicare Other | Admitting: Family Medicine

## 2019-11-28 VITALS — BP 103/68 | HR 70 | Temp 98.0°F | Ht 63.0 in | Wt 245.0 lb

## 2019-11-28 DIAGNOSIS — Z6841 Body Mass Index (BMI) 40.0 and over, adult: Secondary | ICD-10-CM | POA: Diagnosis not present

## 2019-11-28 DIAGNOSIS — E119 Type 2 diabetes mellitus without complications: Secondary | ICD-10-CM

## 2019-11-28 DIAGNOSIS — F3289 Other specified depressive episodes: Secondary | ICD-10-CM

## 2019-11-28 MED ORDER — TOPIRAMATE 50 MG PO TABS: 50.0000 mg | ORAL_TABLET | Freq: Every day | ORAL | 0 refills | Status: DC

## 2019-12-01 NOTE — Progress Notes (Signed)
Office: [phone]  /  Fax: [phone]   HPI:   Chief Complaint: OBESITY Patty [name] is here to discuss her progress with her obesity treatment plan. She is on the keep a food journal with 1200-1300 calories and 85+ grams of protein daily and is following her eating plan approximately 80-85 % of the time. She states she is walking with 6 lb weights on her ankles, doing squats, and yard work for 30 minutes 3 times per week. Patty [name] has been able to lose weight over Thanksgiving. She still has episodes when she indulges in too much chocolate, but she is working on this. She states she is doing well meeting her protein goals.  Her weight is 245 lb (111.1 kg) today and has had a weight loss of 1 pound over a period of 5 weeks since her last visit. She has lost 6 lbs since starting treatment with [name].  Diabetes II Patty [name] has a diagnosis of diabetes type II. Patty [name] states her fasting BGs have improved and are now in the 120's. She denies hypoglycemia. Last A1c was at goal. She has been working on intensive lifestyle modifications including diet, exercise, and weight loss to help control her blood glucose levels.  Depression with Emotional Eating Behaviors Patty [name] is taking 1/2 tablet of Topamax at night, and feels this is helping her with emotional eating but still has some binge-like episodes. She is using food for comfort to the extent that it is negatively impacting her health. She often snacks when she is not hungry. Patty [name] sometimes feels she is out of control and then feels guilty that she made poor food choices. She has been working on behavior modification techniques to help reduce her emotional eating and has been somewhat successful. She shows no sign of suicidal or homicidal ideations.  Depression screen Pat[name] 2/9 04/05/2018 10/30/2017  Decreased Interest 0 0  Down, Depressed, Hopeless 1 1  PHQ - 2 Score 1 1  Altered sleeping 0 -  Tired, decreased energy 1 -  Change in appetite 1 -  Feeling bad or  failure about yourself  1 -  Trouble concentrating 2 -  Moving slowly or fidgety/restless 0 -  Suicidal thoughts 0 -  PHQ-9 Score 6 -  Difficult doing work/chores Not difficult at all -    ASSESSMENT AND PLAN:  Type 2 diabetes mellitus without complication, without long-term current use of insulin (HCC)  Other depression,with emotional eating  - Plan: topiramate (TOPAMAX) 50 MG tablet  Class 3 severe obesity with serious comorbidity and body mass index (BMI) of 40.0 to 44.9 in adult, unspecified obesity type (HCC)  Other depression - with emotional eating  PLAN:  Diabetes II Patty [name] has been given diabetes education by myself including individual ideal Hgb A1c goals. Good blood sugar control is important to decrease the likelihood of diabetic complications such as nephropathy, neuropathy, limb loss, blindness, coronary artery disease, and death. Intensive lifestyle modification including diet, exercise and weight loss were discussed as the first line treatment for diabetes. Patty [name] will continue to follow up with our clinic.  Depression with Emotional Eating Behaviors We discussed behavior modification techniques today to help Patty [name] deal with her emotional eating behaviors. Patty [name] agrees to continue Topamax 50 mg PO daily #30 and we will refill for 1 month. Patty [name] agrees to follow up with our clinic in 6 to 8 weeks.  Obesity Patty [name] is currently in the action stage of change. As such, her goal is to continue with weight loss efforts She  has agreed to keep a food journal with 1200-1300 calories and 85+ grams of protein daily Patty [name] has been instructed to work up to a goal of 150 minutes of combined cardio and strengthening exercise per week for weight loss and overall health benefits. We discussed the following Behavioral Modification Strategies today: holiday eating strategies    Patty [name] has agreed to follow up with our clinic in 6 to 8 weeks. She was informed of the importance of frequent  follow up visits to maximize her success with intensive lifestyle modifications for her multiple health conditions.  ALLERGIES: Allergies  Allergen Reactions  . Victoza [Liraglutide] Shortness Of Breath  . Lasix [Furosemide] Itching    Hypersensitivity   . Metformin And Related Diarrhea    Increased moodiness  . Onion Other (See Comments)    sereve diarrhea, stomach pains (Crohn's flares)  . Oxycodone Other (See Comments)    Altered mental status  . Potassium-Containing Compounds Other (See Comments)    Due to chron's, difficult passing though kidneys not allowing absorption in the body   . Prednisone Other (See Comments)    Altered mood  . Sulfa Drugs Cross Reactors Other (See Comments)    "feels like bugs crawling on me"    MEDICATIONS: Current Outpatient Medications on File Prior to Visit  Medication Sig Dispense Refill  . amoxicillin (AMOXIL) 500 MG capsule Take 1 capsule (500 mg total) by mouth 3 (three) times daily. 30 capsule 0  . amoxicillin (AMOXIL) 500 MG tablet Take 1 tablet (500 mg total) by mouth 3 (three) times daily. 15 tablet 0  . aspirin EC 81 MG tablet Take 81 mg by mouth daily.    . B Complex-C (B-COMPLEX WITH VITAMIN C) tablet Take 1 tablet by mouth once a week.    . Biotin 5000 MCG SUBL Place 5,000 mcg under the tongue daily.    . blood glucose meter kit and supplies KIT Dispense based on patient and insurance preference. Use up to twice daily as directed. (FOR ICD-9 250.00, 250.01). 1 each 0  . CANNABIDIOL PO Take 1 Dose by mouth 2 (two) times daily as needed (anxiety/sleep.). CBD Oil    . cetirizine (ZYRTEC) 10 MG tablet Take 10 mg by mouth daily as needed (sinus/allergies.).    [name] cetirizine-pseudoephedrine (ZYRTEC-D) 5-120 MG tablet Take 1 tablet by mouth daily as needed (sinus headaches.).    [name] Cholecalciferol (VITAMIN D3) 50 MCG (2000 UT) TABS Take 2,000 Units by mouth daily.    . clonazePAM (KLONOPIN) 0.5 MG tablet Take 0.5-1 tablets (0.25-0.5 mg total)  by mouth daily as needed (for anxiety or sleep.). 90 tablet 0  . cyclopentolate (CYCLODRYL,CYCLOGYL) 2 % ophthalmic solution Place 1 drop into both eyes 3 (three) times daily as needed (for uveitis or iritis).    [name] FLUoxetine (PROZAC) 10 MG capsule Take 1 capsule (10 mg total) by mouth daily. 90 capsule 1  . fluticasone (FLONASE) 50 MCG/ACT nasal spray Place 1-2 sprays into both nostrils daily as needed for allergies or rhinitis.    [name] glucose blood (ONETOUCH VERIO) test strip USE UP TO TWICE DAILY AS DIRECTED 100 strip 0  . glucose blood (ONETOUCH VERIO) test strip 1 EACH BY OTHER ROUTE 2 (TWO) TIMES A DAY. 100 strip 0  . Glycerin-Polysorbate 80 (REFRESH DRY EYE THERAPY OP) Place 1 drop into both eyes 3 (three) times daily as needed (dry/irritated eyes.).    [name] hydrocortisone (PROCTOZONE-HC) 2.5 % rectal cream Place 1 application rectally 2 (two) times daily  as needed for hemorrhoids or anal itching. (Patient taking differently: Place 1 application rectally 2 (two) times daily as needed for hemorrhoids or anal itching. Crohn's disease) 30 g 1  . ibuprofen (ADVIL) 200 MG tablet Take 400-800 mg by mouth every 8 (eight) hours as needed (pain.).    [name] Lancets (ONETOUCH DELICA PLUS [id number]) MISC 1 each by Does not apply route 2 (two) times daily. 180 each 3  . MAGNESIUM PO Take 1 tablet by mouth once a week.    . mesalamine (PENTASA) 250 MG CR capsule Take 500 mg by mouth 4 (four) times daily as needed (crohn's disease flare ups).     . prednisoLONE acetate (PRED FORTE) 1 % ophthalmic suspension Place 1 drop into both eyes 2 (two) times daily as needed (Uveitis).    . Probiotic Product (PROBIOTIC PO) Take 1 capsule by mouth daily.     . Tiotropium Bromide Monohydrate (SPIRIVA RESPIMAT) 1.25 MCG/ACT AERS Inhale 2 puffs into the lungs daily. 4 g 0  . traMADol-acetaminophen (ULTRACET) 37.5-325 MG tablet Take 0.5-1 tablets by mouth every 6 (six) hours as needed (for pain.).    [name] Tiotropium Bromide Monohydrate  (SPIRIVA RESPIMAT) 1.25 MCG/ACT AERS Inhale 2 puffs into the lungs daily. 4 g 0   No current facility-administered medications on file prior to visit.     PAST MEDICAL HISTORY: Past Medical History:  Diagnosis Date  . Abnormal nuclear stress test    a. 07/2017: cath with no CAD  . Amyloidosis ([place])   . Crohn disease ([place])   . Crohn's colitis (Patty [name])   . DVT (deep venous thrombosis) ([place])   . Fracture    pelvic fracture   . Grade I diastolic dysfunction   . ILD (interstitial lung disease) ([place])   . OSA on CPAP   . Pernicious anemia   . Polycythemia   . Polycythemia vera ([place])   . Pulmonary embolism ([place]) 2010   s/p hip surgery , reports this was never truly confirmed   . Sinus problem   . Type 2 diabetes mellitus ([place])    diet controlled    PAST SURGICAL HISTORY: Past Surgical History:  Procedure Laterality Date  . BIOPSY  09/30/2019   Procedure: BIOPSY;  Surgeon: Jerene Bears, MD;  Location: [name];  Service: Gastroenterology;;  . [place]  . COLONOSCOPY WITH PROPOFOL N/A 09/30/2019   Procedure: COLONOSCOPY WITH PROPOFOL;  Surgeon: Jerene Bears, MD;  Location: [name];  Service: Gastroenterology;  Laterality: N/A;  . HIP ARTHROPLASTY Right    x 2, initial right THA, then subsequent right THA revision   . LEFT HEART CATH AND CORONARY ANGIOGRAPHY N/A 07/28/2017   Procedure: Left Heart Cath and Coronary Angiography;  Surgeon: Burnell Blanks, MD;  Location: [facility];  Service: Cardiovascular;  Laterality: N/A;  . POLYPECTOMY  09/30/2019   Procedure: POLYPECTOMY;  Surgeon: Jerene Bears, MD;  Location: [name];  Service: Gastroenterology;;    SOCIAL HISTORY: Social History   Tobacco Use  . Smoking status: Former Smoker    Packs/day: 1.00    Years: 45.00    Pack years: 45.00    Types: Cigarettes    Quit date: 12/26/2008    Years since quitting: 10.9  . Smokeless tobacco: Never Used  Substance Use Topics  . Alcohol use: Yes     Comment: occ  . Drug use: No    FAMILY HISTORY: Family History  Problem Relation Age of Onset  .  Glaucoma Mother   . High blood pressure Mother   . High Cholesterol Mother   . Sleep apnea Mother   . Diabetes Mellitus II Father   . Sleep apnea Father   . Anxiety disorder Father   . COPD Brother   . Heart disease Brother   . Non-Hodgkin's lymphoma Brother   . Diabetes Mellitus II Brother   . Glaucoma Brother   . Colon cancer Neg Hx   . Esophageal cancer Neg Hx   . Pancreatic cancer Neg Hx   . Stomach cancer Neg Hx   . Liver disease Neg Hx     ROS: Review of Systems  Constitutional: Positive for weight loss.  Endo/Heme/Allergies:       Negative hypoglycemia  Psychiatric/Behavioral: Positive for depression. Negative for suicidal ideas.    PHYSICAL EXAM: Blood pressure 103/68, pulse 70, temperature 98 F (36.7 C), temperature source Oral, height _0  (1.6 m), weight 245 lb (111.1 kg), SpO2 96 %. Body mass index is 43.4 kg/m. Physical Exam Vitals signs reviewed.  Constitutional:      Appearance: Normal appearance. She is obese.  Cardiovascular:     Rate and Rhythm: Normal rate.     Pulses: Normal pulses.  Pulmonary:     Effort: Pulmonary effort is normal.     Breath sounds: Normal breath sounds.  Musculoskeletal: Normal range of motion.  Skin:    General: Skin is warm and dry.  Neurological:     Mental Status: She is alert and oriented to person, place, and time.  Psychiatric:        Mood and Affect: Mood normal.        Behavior: Behavior normal.     RECENT LABS AND TESTS: BMET    Component Value Date/Time   NA 141 01/21/2019 1321   K 4.4 01/21/2019 1321   CL 103 01/21/2019 1321   CO2 23 01/21/2019 1321   GLUCOSE 101 (H) 01/21/2019 1321   GLUCOSE 119 (H) 07/29/2017 0553   BUN 22 08/09/2019 1503   BUN 21 01/21/2019 1321   CREATININE 0.81 08/09/2019 1503   CALCIUM 9.4 01/21/2019 1321   GFRNONAA 82 01/21/2019 1321   GFRAA 95 01/21/2019 1321   Lab  Results  Component Value Date   HGBA1C 6.0 (A) 09/24/2019   HGBA1C 5.9 (H) 01/21/2019   HGBA1C 5.9 (H) 08/07/2018   HGBA1C 6.6 (H) 04/05/2018   Lab Results  Component Value Date   INSULIN 23.6 01/21/2019   INSULIN 16.4 08/07/2018   INSULIN 30.6 (H) 04/05/2018   CBC    Component Value Date/Time   WBC 6.8 01/21/2019 1321   RBC 4.87 01/21/2019 1321   HGB 14.4 01/21/2019 1321   HCT 44.0 01/21/2019 1321   PLT 342 07/25/2017 1544   MCV 90 01/21/2019 1321   MCH 29.6 01/21/2019 1321   MCHC 32.7 01/21/2019 1321   RDW 14.4 01/21/2019 1321   LYMPHSABS 1.6 01/21/2019 1321   EOSABS 0.1 01/21/2019 1321   BASOSABS 0.0 01/21/2019 1321   Iron/TIBC/Ferritin/ %Sat    Component Value Date/Time   IRON 111 04/05/2018 1009   TIBC 311 04/05/2018 1009   FERRITIN 71 04/05/2018 1009   IRONPCTSAT 36 04/05/2018 1009   Lipid Panel     Component Value Date/Time   CHOL 178 01/21/2019 1321   TRIG 74 01/21/2019 1321   HDL 61 01/21/2019 1321   LDLCALC 102 (H) 01/21/2019 1321   Hepatic Function Panel     Component Value Date/Time  PROT 7.0 01/21/2019 1321   ALBUMIN 4.2 01/21/2019 1321   AST 12 01/21/2019 1321   ALT 20 01/21/2019 1321   ALKPHOS 79 01/21/2019 1321   BILITOT 0.5 01/21/2019 1321      Component Value Date/Time   TSH 0.445 (L) 01/21/2019 1321   TSH 0.821 04/05/2018 1009      OBESITY BEHAVIORAL INTERVENTION VISIT  Today's visit was # 18   Starting weight: 251 lbs Starting date: 04/05/18 Today's weight : 245 lbs Today's date: 11/28/2019 Total lbs lost to date: 6 At least 15 minutes were spent on discussing the following behavioral intervention visit.   ASK: We discussed the diagnosis of obesity with Patty [name] today and Patty [name] agreed to give [name] permission to discuss obesity behavioral modification therapy today.  ASSESS: Patty [name] has the diagnosis of obesity and her BMI today is 43.41 Patty [name] is in the action stage of change   ADVISE: Wilhemenia was educated on  the multiple health risks of obesity as well as the benefit of weight loss to improve her health. She was advised of the need for long term treatment and the importance of lifestyle modifications to improve her current health and to decrease her risk of future health problems.  AGREE: Multiple dietary modification options and treatment options were discussed and  Tamrah agreed to follow the recommendations documented in the above note.  ARRANGE: Amila was educated on the importance of frequent visits to treat obesity as outlined per CMS and USPSTF guidelines and agreed to schedule her next follow up appointment today.  I, Trixie Dredge, am acting as transcriptionist for Dennard Nip, MD  I have reviewed the above documentation for accuracy and completeness, and I agree with the above. -Dennard Nip, MD

## 2019-12-17 ENCOUNTER — Other Ambulatory Visit (INDEPENDENT_AMBULATORY_CARE_PROVIDER_SITE_OTHER): Payer: Self-pay | Admitting: Family Medicine

## 2019-12-17 DIAGNOSIS — F3289 Other specified depressive episodes: Secondary | ICD-10-CM

## 2019-12-25 ENCOUNTER — Encounter (INDEPENDENT_AMBULATORY_CARE_PROVIDER_SITE_OTHER): Payer: Self-pay | Admitting: Family Medicine

## 2019-12-26 NOTE — Telephone Encounter (Signed)
Please review

## 2020-01-01 ENCOUNTER — Encounter (INDEPENDENT_AMBULATORY_CARE_PROVIDER_SITE_OTHER): Payer: Self-pay | Admitting: Family Medicine

## 2020-01-01 NOTE — Telephone Encounter (Signed)
Please review

## 2020-01-06 ENCOUNTER — Other Ambulatory Visit: Payer: Self-pay

## 2020-01-06 ENCOUNTER — Telehealth: Payer: Self-pay | Admitting: Family Medicine

## 2020-01-06 DIAGNOSIS — E119 Type 2 diabetes mellitus without complications: Secondary | ICD-10-CM

## 2020-01-06 MED ORDER — ONETOUCH VERIO VI STRP: ORAL_STRIP | 1 refills | Status: DC

## 2020-01-06 NOTE — Telephone Encounter (Signed)
See pt advice for documentation.

## 2020-01-06 NOTE — Telephone Encounter (Signed)
Patient called in saying that a prescription was sent in for her test strips but she is no longer covered the insurance on file and would like someone to take a look at the [place] message about the new coverage

## 2020-01-09 NOTE — Telephone Encounter (Signed)
Has this been taken care of?  Thank you

## 2020-01-10 ENCOUNTER — Encounter: Payer: Self-pay | Admitting: Family Medicine

## 2020-01-10 ENCOUNTER — Ambulatory Visit (INDEPENDENT_AMBULATORY_CARE_PROVIDER_SITE_OTHER): Payer: Medicare Other | Admitting: Family Medicine

## 2020-01-10 VITALS — Ht 63.0 in | Wt 245.0 lb

## 2020-01-10 DIAGNOSIS — Z1231 Encounter for screening mammogram for malignant neoplasm of breast: Secondary | ICD-10-CM | POA: Diagnosis not present

## 2020-01-10 DIAGNOSIS — F411 Generalized anxiety disorder: Secondary | ICD-10-CM

## 2020-01-10 DIAGNOSIS — J309 Allergic rhinitis, unspecified: Secondary | ICD-10-CM | POA: Diagnosis not present

## 2020-01-10 DIAGNOSIS — Z79899 Other long term (current) drug therapy: Secondary | ICD-10-CM

## 2020-01-10 DIAGNOSIS — G479 Sleep disorder, unspecified: Secondary | ICD-10-CM

## 2020-01-10 DIAGNOSIS — J849 Interstitial pulmonary disease, unspecified: Secondary | ICD-10-CM | POA: Diagnosis not present

## 2020-01-10 DIAGNOSIS — E2839 Other primary ovarian failure: Secondary | ICD-10-CM | POA: Diagnosis not present

## 2020-01-10 DIAGNOSIS — R7301 Impaired fasting glucose: Secondary | ICD-10-CM

## 2020-01-10 DIAGNOSIS — D510 Vitamin B12 deficiency anemia due to intrinsic factor deficiency: Secondary | ICD-10-CM

## 2020-01-10 HISTORY — DX: Sleep disorder, unspecified: G47.9

## 2020-01-10 MED ORDER — FLUOXETINE HCL 10 MG PO CAPS: 10.0000 mg | ORAL_CAPSULE | Freq: Every day | ORAL | 3 refills | Status: DC

## 2020-01-10 MED ORDER — CLONAZEPAM 0.5 MG PO TABS: 0.2500 mg | ORAL_TABLET | Freq: Every day | ORAL | 1 refills | Status: DC | PRN

## 2020-01-10 NOTE — Progress Notes (Signed)
Virtual Visit via Video Note  Subjective  CC:  Chief Complaint  Patient presents with  . Diabetes   New patient, I reviewed chart in detail . Multiple specialists notes and findings/study results, labs.   I connected with Patty [name] on 01/10/20 at  4:20 PM EST by a video enabled telemedicine application and verified that I am speaking with the correct person using two identifiers. Location patient: Home Location provider: [facility] [place], Office Persons participating in the virtual visit: [name], Patty Arnt, MD Pat[name], [place] discussed the limitations of evaluation and management by telemedicine and the availability of in person appointments. The patient expressed understanding and agreed to proceed. HPI: Patty [name] is a 69 y.o. female who was contacted today to address the problems listed above in the chief complaint. . Diabetes on the PL but not technically a diabetic. We discussed fasting sugars: rarely elevated. Working on weight loss with healthy weight and wellness center. She has been evaluated by endocrinology to ensure there is no secondary cause to her obesity. Work up to date has been negative.  . Sleep disorder and GAD: on chronic benzo's needing refill. I reviewed patient's records from the PMP aware controlled substance registry today.  . Sleep apnea on osa . ILD followed by pulm. Notes reviewed . Crohn's disease: currently stable but long h/o with complicated course w/ h/o surgery, perniscious anemia, and managed by GI.  [name] HM: overdue for mammo, dexa, cpe .  Assessment  1. Impaired fasting glucose   2. Sleep disorder   3. GAD (generalized anxiety disorder)   4. Chronic allergic rhinitis   5. Pernicious anemia   6. Chronic prescription benzodiazepine use   7. ILD (interstitial lung disease) ([place])   8. Morbid obesity ([place])   9. Encounter for screening mammogram for breast cancer   10.  Hypoestrogenism      Plan   IFG:  Discussed dx; to manage with lifestyle changes.   Sleep disorder and chronic benzo use; refilled. Stable and controlled.   ILD and crohns per specialist. Reviewed notes and pt reports stable.   HM: order mammogram and dexa  cpe at next visit .  Time today spent on case: 40 minutes I discussed the assessment and treatment plan with the patient. The patient was provided an opportunity to ask questions and all were answered. The patient agreed with the plan and demonstrated an understanding of the instructions.   The patient was advised to call back or seek an in-person evaluation if the symptoms worsen or if the condition fails to improve as anticipated. Follow up: No follow-ups on file.  04/29/2020  Meds ordered this encounter  Medications  . clonazePAM (KLONOPIN) 0.5 MG tablet    Sig: Take 0.5-1 tablets (0.25-0.5 mg total) by mouth daily as needed (for anxiety or sleep.).    Dispense:  90 tablet    Refill:  1  . FLUoxetine (PROZAC) 10 MG capsule    Sig: Take 1 capsule (10 mg total) by mouth daily.    Dispense:  90 capsule    Refill:  3      I reviewed the patients updated PMH, FH, and SocHx.    Patient Active Problem List   Diagnosis Date Noted  . Sleep disorder 01/10/2020    Priority: High  . Pernicious anemia 01/10/2020    Priority: High  . Chronic prescription benzodiazepine use 01/10/2020  Priority: High  . Morbid obesity ([place]) 09/24/2019    Priority: High  . History of DVT (deep vein thrombosis), with PE 07/25/2019    Priority: High  . OSA on CPAP     Priority: High  . Crohn disease ([place]) 06/15/2017    Priority: High  . ILD (interstitial lung disease) ([place]) 05/29/2017    Priority: High  . Dyslipidemia 09/24/2019    Priority: Medium  . History of polycythemia vera 11/12/2014    Priority: Medium  . Chronic allergic rhinitis 01/10/2020    Priority: Low  . Benign neoplasm of rectum    Current Meds  Medication Sig  .  aspirin EC 81 MG tablet Take 81 mg by mouth daily.  . Biotin 5000 MCG SUBL Place 5,000 mcg under the tongue daily.  . blood glucose meter kit and supplies KIT Dispense based on patient and insurance preference. Use up to twice daily as directed. (FOR ICD-9 250.00, 250.01).  . CANNABIDIOL PO Take 1 Dose by mouth 2 (two) times daily as needed (anxiety/sleep.). CBD Oil  . cetirizine (ZYRTEC) 10 MG tablet Take 10 mg by mouth daily as needed (sinus/allergies.).  [name] cetirizine-pseudoephedrine (ZYRTEC-D) 5-120 MG tablet Take 1 tablet by mouth daily as needed (sinus headaches.).  [name] Cholecalciferol (VITAMIN D3) 50 MCG (2000 UT) TABS Take 2,000 Units by mouth daily.  . clonazePAM (KLONOPIN) 0.5 MG tablet Take 0.5-1 tablets (0.25-0.5 mg total) by mouth daily as needed (for anxiety or sleep.).  [name] cyclopentolate (CYCLODRYL,CYCLOGYL) 2 % ophthalmic solution Place 1 drop into both eyes 3 (three) times daily as needed (for uveitis or iritis).  [name] FLUoxetine (PROZAC) 10 MG capsule Take 1 capsule (10 mg total) by mouth daily.  . fluticasone (FLONASE) 50 MCG/ACT nasal spray Place 1-2 sprays into both nostrils daily as needed for allergies or rhinitis.  [name] glucose blood (ONETOUCH VERIO) test strip 1 EACH BY OTHER ROUTE 2 (TWO) TIMES A DAY.  [name] Glycerin-Polysorbate 80 (REFRESH DRY EYE THERAPY OP) Place 1 drop into both eyes 3 (three) times daily as needed (dry/irritated eyes.).  [name] hydrocortisone (PROCTOZONE-HC) 2.5 % rectal cream Place 1 application rectally 2 (two) times daily as needed for hemorrhoids or anal itching. (Patient taking differently: Place 1 application rectally 2 (two) times daily as needed for hemorrhoids or anal itching. Crohn's disease)  . ibuprofen (ADVIL) 200 MG tablet Take 400-800 mg by mouth every 8 (eight) hours as needed (pain.).  [name] MAGNESIUM PO Take 1 tablet by mouth once a week.  . mesalamine (PENTASA) 250 MG CR capsule Take 500 mg by mouth 4 (four) times daily as needed (crohn's disease flare  ups).   . prednisoLONE acetate (PRED FORTE) 1 % ophthalmic suspension Place 1 drop into both eyes 2 (two) times daily as needed (Uveitis).  . Probiotic Product (PROBIOTIC PO) Take 1 capsule by mouth daily.   . Tiotropium Bromide Monohydrate (SPIRIVA RESPIMAT) 1.25 MCG/ACT AERS Inhale 2 puffs into the lungs daily.  [name] topiramate (TOPAMAX) 50 MG tablet Take 1 tablet (50 mg total) by mouth daily.  . traMADol-acetaminophen (ULTRACET) 37.5-325 MG tablet Take 0.5-1 tablets by mouth every 6 (six) hours as needed (for pain.).  . [DISCONTINUED] clonazePAM (KLONOPIN) 0.5 MG tablet Take 0.5-1 tablets (0.25-0.5 mg total) by mouth daily as needed (for anxiety or sleep.).  . [DISCONTINUED] FLUoxetine (PROZAC) 10 MG capsule Take 1 capsule (10 mg total) by mouth daily.  . [DISCONTINUED] Lancets (ONETOUCH DELICA PLUS [id number]) [place] 1 each by Does not apply route 2 (  two) times daily.  . [DISCONTINUED] Tiotropium Bromide Monohydrate (SPIRIVA RESPIMAT) 1.25 MCG/ACT AERS Inhale 2 puffs into the lungs daily.    Allergies: Patient is allergic to victoza [liraglutide]; lasix [furosemide]; metformin and related; onion; oxycodone; potassium-containing compounds; prednisone; and sulfa drugs cross reactors. Family History: Patient family history includes Anxiety disorder in her father; COPD in her brother; Diabetes Mellitus II in her brother and father; Glaucoma in her brother and mother; Heart disease in her brother; High Cholesterol in her mother; High blood pressure in her mother; Non-Hodgkin's lymphoma in her brother; Sleep apnea in her father and mother. Social History:  Patient  reports that she quit smoking about 11 years ago. Her smoking use included cigarettes. She has a 45.00 pack-year smoking history. She has never used smokeless tobacco. She reports current alcohol use. She reports that she does not use drugs.  Review of Systems: Constitutional: Negative for fever malaise or anorexia Cardiovascular: negative  for chest pain Respiratory: negative for SOB or persistent cough Gastrointestinal: negative for abdominal pain  OBJECTIVE Vitals: Ht _0  (1.6 m)   Wt 245 lb (111.1 kg)   BMI 43.40 kg/m  General: no acute distress , A&Ox3  Patty Arnt, MD

## 2020-01-13 NOTE — Telephone Encounter (Signed)
Patient was seen by Dr. Jonni Sanger

## 2020-01-16 ENCOUNTER — Other Ambulatory Visit: Payer: Self-pay

## 2020-01-16 ENCOUNTER — Ambulatory Visit (INDEPENDENT_AMBULATORY_CARE_PROVIDER_SITE_OTHER): Payer: Medicare Other | Admitting: Family Medicine

## 2020-01-16 ENCOUNTER — Encounter (INDEPENDENT_AMBULATORY_CARE_PROVIDER_SITE_OTHER): Payer: Self-pay | Admitting: Family Medicine

## 2020-01-16 VITALS — BP 90/61 | HR 103 | Temp 98.1°F | Ht 63.0 in | Wt 248.0 lb

## 2020-01-16 DIAGNOSIS — Z6841 Body Mass Index (BMI) 40.0 and over, adult: Secondary | ICD-10-CM

## 2020-01-16 DIAGNOSIS — R7303 Prediabetes: Secondary | ICD-10-CM

## 2020-01-16 DIAGNOSIS — R5383 Other fatigue: Secondary | ICD-10-CM

## 2020-01-16 DIAGNOSIS — E538 Deficiency of other specified B group vitamins: Secondary | ICD-10-CM | POA: Diagnosis not present

## 2020-01-16 DIAGNOSIS — E559 Vitamin D deficiency, unspecified: Secondary | ICD-10-CM | POA: Diagnosis not present

## 2020-01-17 LAB — VITAMIN D 25 HYDROXY (VIT D DEFICIENCY, FRACTURES): Vit D, 25-Hydroxy: 32.3 ng/mL (ref 30.0–100.0)

## 2020-01-17 LAB — COMPREHENSIVE METABOLIC PANEL
ALT: 17 IU/L (ref 0–32)
AST: 18 IU/L (ref 0–40)
Albumin/Globulin Ratio: 1.6 (ref 1.2–2.2)
Albumin: 4.3 g/dL (ref 3.8–4.8)
Alkaline Phosphatase: 87 IU/L (ref 39–117)
BUN/Creatinine Ratio: 23 (ref 12–28)
BUN: 19 mg/dL (ref 8–27)
Bilirubin Total: 0.2 mg/dL (ref 0.0–1.2)
CO2: 21 mmol/L (ref 20–29)
Calcium: 9.7 mg/dL (ref 8.7–10.3)
Chloride: 107 mmol/L — ABNORMAL HIGH (ref 96–106)
Creatinine, Ser: 0.82 mg/dL (ref 0.57–1.00)
GFR calc Af Amer: 84 mL/min/{1.73_m2} (ref >59)
GFR calc non Af Amer: 73 mL/min/{1.73_m2} (ref >59)
Globulin, Total: 2.7 g/dL (ref 1.5–4.5)
Glucose: 131 mg/dL — ABNORMAL HIGH (ref 65–99)
Potassium: 4.6 mmol/L (ref 3.5–5.2)
Sodium: 145 mmol/L — ABNORMAL HIGH (ref 134–144)
Total Protein: 7 g/dL (ref 6.0–8.5)

## 2020-01-17 LAB — HEMOGLOBIN A1C
Est. average glucose Bld gHb Est-mCnc: 131 mg/dL
Hgb A1c MFr Bld: 6.2 % — ABNORMAL HIGH (ref 4.8–5.6)

## 2020-01-17 LAB — INSULIN, RANDOM: INSULIN: 204 u[IU]/mL — ABNORMAL HIGH (ref 2.6–24.9)

## 2020-01-17 LAB — VITAMIN B12: Vitamin B-12: 681 pg/mL (ref 232–1245)

## 2020-01-22 NOTE — Progress Notes (Signed)
Chief Complaint:   OBESITY Myrle is here to discuss her progress with her obesity treatment plan along with follow-up of her obesity related diagnoses. Menucha is on keeping a food journal and adhering to recommended goals of 1200-1300 calories and 85+ grams of protein and states she is following her eating plan approximately 70% of the time. Maura states she is doing 0 minutes 0 times per week.  Today's visit was #: 20 Starting weight: 251 lbs Starting date: 04/05/18 Today's weight: 248 lbs Today's date: 01/16/2020 Total lbs lost to date: 3 Total lbs lost since last in-office visit: 0  Interim History: Farida states she has been on steroids and noticed increase in hunger. She has gained some weight, but she states she is ready to get back on track with journaling.  Subjective:   1. Other fatigue Laiba notes increased fatigue and doesn't know why. She is due for labs soon.  2. Pre-diabetes Lesli is working on diet, and she is due to have labs rechecked. She denies hypoglycemia.  3. Vitamin D deficiency Dyasia is on OTC Vit D 2,000 IU daily. She is due to have labs rechecked.  4. B12 nutritional deficiency Dalani has a history of low B12 and fatigue. She is due for labs soon.  Assessment/Plan:   1. Other fatigue Larin does feel that her weight is causing her energy to be lower than it should be. Fatigue may be related to obesity, depression or many other causes. Labs will be ordered, and in the meanwhile, Alpha will focus on self care including making healthy food choices, increasing physical activity and focusing on stress reduction.  2. Pre-diabetes Ivannah will continue to work on weight loss, diet, exercise, and decreasing simple carbohydrates to help decrease the risk of diabetes. We will check labs today and follow up.  - Comprehensive metabolic panel - Hemoglobin A1c - Insulin, random  3. Vitamin D deficiency Low Vitamin D level contributes to fatigue and are associated  with obesity, breast, and colon cancer. We will check labs today and will follow up. Geral will follow-up for routine testing of Vitamin D, at least 2-3 times per year to avoid over-replacement.  - VITAMIN D 25 Hydroxy (Vit-D Deficiency, Fractures)  4. B12 nutritional deficiency The diagnosis was reviewed with the patient. Counseling provided today, see below. We will check labs today and will continue to monitor. Orders and follow up as documented in patient record.  Counseling . The body needs vitamin B12: to make red blood cells; to make DNA; and to help the nerves work properly so they can carry messages from the brain to the body.  . The main causes of vitamin B12 deficiency include dietary deficiency, digestive diseases, pernicious anemia, and having a surgery in which part of the stomach or small intestine is removed.  . Certain medicines can make it harder for the body to absorb vitamin B12. These medicines include: heartburn medications; some antibiotics; some medications used to treat diabetes, gout, and high cholesterol.  . In some cases, there are no symptoms of this condition. If the condition leads to anemia or nerve damage, various symptoms can occur, such as weakness or fatigue, shortness of breath, and numbness or tingling in your hands and feet.   . Treatment:  o May include taking vitamin B12 supplements.  o Avoid alcohol.  o Eat lots of healthy foods that contain vitamin B12: - Beef, pork, chicken, [place], and organ meats, such as liver.  - Seafood: This  includes clams, rainbow trout, salmon, tuna, and haddock. Eggs.  - Cereal and dairy products that are fortified: This means that vitamin B12 has been added to the food.   - Vitamin B12  5. Class 3 severe obesity with serious comorbidity and body mass index (BMI) of 40.0 to 44.9 in adult, unspecified obesity type [facility] Liliya is currently in the action stage of change. As such, her goal is to continue with weight loss  efforts. She has agreed to keeping a food journal and adhering to recommended goals of 1200-1300 calories and 85+ grams of protein daily.   Exercise goals: No exercise has been prescribed at this time.  Behavioral modification strategies: increasing lean protein intake and decreasing simple carbohydrates.  Reine has agreed to follow-up with our clinic in 4 weeks. She was informed of the importance of frequent follow-up visits to maximize her success with intensive lifestyle modifications for her multiple health conditions.   Antania was informed we would discuss her lab results at her next visit unless there is a critical issue that needs to be addressed sooner. Janille agreed to keep her next visit at the agreed upon time to discuss these results.  Objective:   Blood pressure 90/61, pulse (!) 103, temperature 98.1 F (36.7 C), temperature source Oral, height _0  (1.6 m), weight 248 lb (112.5 kg), SpO2 92 %. Body mass index is 43.93 kg/m.  General: Cooperative, alert, well developed, in no acute distress. HEENT: Conjunctivae and lids unremarkable. Cardiovascular: Regular rhythm.  Lungs: Normal work of breathing. Neurologic: No focal deficits.   Lab Results  Component Value Date   CREATININE 0.82 01/16/2020   BUN 19 01/16/2020   NA 145 (H) 01/16/2020   K 4.6 01/16/2020   CL 107 (H) 01/16/2020   CO2 21 01/16/2020   Lab Results  Component Value Date   ALT 17 01/16/2020   AST 18 01/16/2020   ALKPHOS 87 01/16/2020   BILITOT 0.2 01/16/2020   Lab Results  Component Value Date   HGBA1C 6.2 (H) 01/16/2020   HGBA1C 6.0 (A) 09/24/2019   HGBA1C 5.9 (H) 01/21/2019   HGBA1C 5.9 (H) 08/07/2018   HGBA1C 6.6 (H) 04/05/2018   Lab Results  Component Value Date   INSULIN 204.0 (H) 01/16/2020   INSULIN 23.6 01/21/2019   INSULIN 16.4 08/07/2018   INSULIN 30.6 (H) 04/05/2018   Lab Results  Component Value Date   TSH 0.445 (L) 01/21/2019   Lab Results  Component Value Date   CHOL  178 01/21/2019   HDL 61 01/21/2019   LDLCALC 102 (H) 01/21/2019   TRIG 74 01/21/2019   Lab Results  Component Value Date   WBC 6.8 01/21/2019   HGB 14.4 01/21/2019   HCT 44.0 01/21/2019   MCV 90 01/21/2019   PLT 342 07/25/2017   Lab Results  Component Value Date   IRON 111 04/05/2018   TIBC 311 04/05/2018   FERRITIN 71 04/05/2018    Obesity Behavioral Intervention Documentation for Insurance:   Approximately 15 minutes were spent on the discussion below.  ASK: We discussed the diagnosis of obesity with Jelitza today and Rozalia agreed to give [name] permission to discuss obesity behavioral modification therapy today.  ASSESS: Tiaria has the diagnosis of obesity and her BMI today is 43.94. Jeanice is in the action stage of change.   ADVISE: Roseann was educated on the multiple health risks of obesity as well as the benefit of weight loss to improve her health. She was advised of  the need for long term treatment and the importance of lifestyle modifications to improve her current health and to decrease her risk of future health problems.  AGREE: Multiple dietary modification options and treatment options were discussed and [place] agreed to follow the recommendations documented in the above note.  ARRANGE: Cattaleya was educated on the importance of frequent visits to treat obesity as outlined per CMS and USPSTF guidelines and agreed to schedule her next follow up appointment today.  Attestation Statements:   Reviewed by clinician on day of visit: allergies, medications, problem list, medical history, surgical history, family history, social history, and previous encounter notes.   I, Trixie Dredge, am acting as transcriptionist for Dennard Nip, MD.  I have reviewed the above documentation for accuracy and completeness, and I agree with the above. -  Dennard Nip, MD

## 2020-02-04 ENCOUNTER — Encounter: Payer: Self-pay | Admitting: Family Medicine

## 2020-02-12 ENCOUNTER — Encounter (INDEPENDENT_AMBULATORY_CARE_PROVIDER_SITE_OTHER): Payer: Self-pay | Admitting: Family Medicine

## 2020-02-12 ENCOUNTER — Ambulatory Visit (INDEPENDENT_AMBULATORY_CARE_PROVIDER_SITE_OTHER): Payer: Medicare Other | Admitting: Family Medicine

## 2020-02-12 ENCOUNTER — Other Ambulatory Visit: Payer: Self-pay

## 2020-02-12 VITALS — BP 119/57 | HR 67 | Temp 97.8°F | Ht 63.0 in | Wt 247.0 lb

## 2020-02-12 DIAGNOSIS — Z6841 Body Mass Index (BMI) 40.0 and over, adult: Secondary | ICD-10-CM | POA: Diagnosis not present

## 2020-02-12 DIAGNOSIS — R5383 Other fatigue: Secondary | ICD-10-CM

## 2020-02-16 ENCOUNTER — Ambulatory Visit: Payer: Medicare Other | Attending: Internal Medicine

## 2020-02-16 DIAGNOSIS — Z23 Encounter for immunization: Secondary | ICD-10-CM | POA: Insufficient documentation

## 2020-02-16 NOTE — Progress Notes (Signed)
   Covid-19 Vaccination Clinic  Name:  Patty [name]    MRN: [id number] DOB: [birth year]  02/16/2020  [name] was observed post Covid-19 immunization for 15 minutes without incidence. She was provided with Vaccine Information Sheet and instruction to access the V-Safe system.   [name] was instructed to call 911 with any severe reactions post vaccine: [name] Difficulty breathing  . Swelling of your face and throat  . A fast heartbeat  . A bad rash all over your body  . Dizziness and weakness    Immunizations Administered    Name Date Dose VIS Date Route   Pfizer COVID-19 Vaccine 02/16/2020  2:29 PM 0.3 mL 12/06/2019 Intramuscular   Manufacturer: [place]   Lot: [id number]   [place]: [id number]

## 2020-02-18 NOTE — Progress Notes (Signed)
Chief Complaint:   OBESITY Patty [name] is here to discuss her progress with her obesity treatment plan along with follow-up of her obesity related diagnoses. Patty [name] is on keeping a food journal and adhering to recommended goals of 1200-1300 calories and 85+ grams of protein daily and states she is following her eating plan approximately 85% of the time. Patty [name] states she is walking for 20-30 minutes 3 times per week.  Today's visit was #: 20 Starting weight: 251 lbs Starting date: 04/05/18 Today's weight: 247 lbs Today's date: 02/12/2020 Total lbs lost to date: 4 Total lbs lost since last in-office visit: 1  Interim History: Patty [name] continues to do well with weight loss and journaling. She is working on meeting her calorie and protein goals. Her hunger is mostly controlled.  Subjective:   1. Other fatigue Patty [name] feels low on energy since stopping her B12 injections. She wonders if this is related. She also wonders if this may be related to stress, sleep, or CBD oil. Her last labs were within normal limits.  Assessment/Plan:   1. Other fatigue Patty [name] is to try to decrease CBD oil and will continue to monitor. My be due to seasonal affective disorder, which tends to worsen in February, but improves in March. In the meanwhile, Patty [name] will focus on self care including making healthy food choices, increasing physical activity and focusing on stress reduction.  2. Class 3 severe obesity with serious comorbidity and body mass index (BMI) of 40.0 to 44.9 in adult, unspecified obesity type [facility]) Patty [name] is currently in the action stage of change. As such, her goal is to continue with weight loss efforts. She has agreed to keeping a food journal and adhering to recommended goals of 1200-1300 calories and 85+ grams of protein daily.   Exercise goals: Patty [name] is to continue her current exercise regimen as is.  Behavioral modification strategies: increasing lean protein intake.  Patty [name] has agreed to follow-up  with our clinic in 3 months. She was informed of the importance of frequent follow-up visits to maximize her success with intensive lifestyle modifications for her multiple health conditions.   Objective:   Blood pressure (!) 119/57, pulse 67, temperature 97.8 F (36.6 C), temperature source Oral, height _0  (1.6 m), weight 247 lb (112 kg), SpO2 95 %. Body mass index is 43.75 kg/m.  General: Cooperative, alert, well developed, in no acute distress. HEENT: Conjunctivae and lids unremarkable. Cardiovascular: Regular rhythm.  Lungs: Normal work of breathing. Neurologic: No focal deficits.   Lab Results  Component Value Date   CREATININE 0.82 01/16/2020   BUN 19 01/16/2020   NA 145 (H) 01/16/2020   K 4.6 01/16/2020   CL 107 (H) 01/16/2020   CO2 21 01/16/2020   Lab Results  Component Value Date   ALT 17 01/16/2020   AST 18 01/16/2020   ALKPHOS 87 01/16/2020   BILITOT 0.2 01/16/2020   Lab Results  Component Value Date   HGBA1C 6.2 (H) 01/16/2020   HGBA1C 6.0 (A) 09/24/2019   HGBA1C 5.9 (H) 01/21/2019   HGBA1C 5.9 (H) 08/07/2018   HGBA1C 6.6 (H) 04/05/2018   Lab Results  Component Value Date   INSULIN 204.0 (H) 01/16/2020   INSULIN 23.6 01/21/2019   INSULIN 16.4 08/07/2018   INSULIN 30.6 (H) 04/05/2018   Lab Results  Component Value Date   TSH 0.445 (L) 01/21/2019   Lab Results  Component Value Date   CHOL 178 01/21/2019   HDL 61 01/21/2019   [place]  102 (H) 01/21/2019   TRIG 74 01/21/2019   Lab Results  Component Value Date   WBC 6.8 01/21/2019   HGB 14.4 01/21/2019   HCT 44.0 01/21/2019   MCV 90 01/21/2019   PLT 342 07/25/2017   Lab Results  Component Value Date   IRON 111 04/05/2018   TIBC 311 04/05/2018   FERRITIN 71 04/05/2018   Attestation Statements:   Reviewed by clinician on day of visit: allergies, medications, problem list, medical history, surgical history, family history, social history, and previous encounter notes.  Time spent on  visit including pre-visit chart review and post-visit care was 32 minutes.    I, Trixie Dredge, am acting as transcriptionist for Dennard Nip, MD.  I have reviewed the above documentation for accuracy and completeness, and I agree with the above. -  Dennard Nip, MD

## 2020-02-26 ENCOUNTER — Encounter: Payer: Self-pay | Admitting: Family Medicine

## 2020-03-09 ENCOUNTER — Telehealth: Payer: Self-pay | Admitting: Family Medicine

## 2020-03-09 NOTE — Telephone Encounter (Signed)
Need to clarify foot issue: is this an acute problem? If so, then same day is appropriate but if it isn't , then she will have to wait until I have openings. thanks

## 2020-03-09 NOTE — Telephone Encounter (Signed)
Patient called in and stated that she wanted to make appt for Dr. Jonni Sanger to go over her Biopsy and for her to look at her foot. The first available is not till April 5th the patient would like to be seen before then is it ok to use a same day.

## 2020-03-09 NOTE — Telephone Encounter (Signed)
See below

## 2020-03-10 NOTE — Telephone Encounter (Signed)
LVM for patient to return call.

## 2020-03-10 NOTE — Telephone Encounter (Signed)
Patient return call. Patient states that you can also send message through my chart if you're unable to reach her by phone.

## 2020-03-11 ENCOUNTER — Encounter: Payer: Self-pay | Admitting: Family Medicine

## 2020-03-11 NOTE — Telephone Encounter (Signed)
Please schedule patient an appointment to be seen for her foot. It does not have to be a same day appointment. Thanks

## 2020-03-11 NOTE — Telephone Encounter (Signed)
MyChart message sent to patient.

## 2020-03-12 NOTE — Telephone Encounter (Signed)
LVM for patient to call back and schedule

## 2020-03-16 ENCOUNTER — Ambulatory Visit (INDEPENDENT_AMBULATORY_CARE_PROVIDER_SITE_OTHER): Payer: Medicare Other | Admitting: Family Medicine

## 2020-03-16 ENCOUNTER — Other Ambulatory Visit: Payer: Self-pay

## 2020-03-16 ENCOUNTER — Encounter: Payer: Self-pay | Admitting: Family Medicine

## 2020-03-16 VITALS — BP 112/68 | HR 93 | Temp 98.0°F | Ht 63.0 in | Wt 257.6 lb

## 2020-03-16 DIAGNOSIS — L84 Corns and callosities: Secondary | ICD-10-CM | POA: Diagnosis not present

## 2020-03-16 DIAGNOSIS — K1329 Other disturbances of oral epithelium, including tongue: Secondary | ICD-10-CM | POA: Diagnosis not present

## 2020-03-16 DIAGNOSIS — Z6841 Body Mass Index (BMI) 40.0 and over, adult: Secondary | ICD-10-CM | POA: Diagnosis not present

## 2020-03-16 NOTE — Progress Notes (Signed)
Subjective  CC:  Chief Complaint  Patient presents with  . Plantars Wart    would like to discuss biopsy report. bilateral foot pain    HPI: Patty [name] is a 69 y.o. female who presents to the office today to address the problems listed above in the chief complaint.  Had biopsy of mucosa of mouth in December; hasn't been able to get results from oral surgeon. Brings in results; epithelial hyperplasia. No atypia  ? Warts on feet. Hurts when walks.   Assessment  1. Plantar callus   2. Focal epithelial hyperplasia of mouth   3. Morbid obesity ([place])      Plan   Calluses, plantar:  Educated and discussed proper foot wear and padding. S/o paring.   Benign pathology from biopsy discussed. No further treatment indicated but recommend f/u with dentist for surveillance.   Obesity: struggles with weight loss. Has been to healthy weight and weight loss clinic but hasn't been successful in keeping weight off.   Follow up: No follow-ups on file.  04/29/2020  No orders of the defined types were placed in this encounter.  No orders of the defined types were placed in this encounter.     I reviewed the patients updated PMH, FH, and SocHx.    Patient Active Problem List   Diagnosis Date Noted  . Sleep disorder 01/10/2020    Priority: High  . Pernicious anemia 01/10/2020    Priority: High  . Chronic prescription benzodiazepine use 01/10/2020    Priority: High  . Morbid obesity ([place]) 09/24/2019    Priority: High  . History of DVT (deep vein thrombosis), with PE 07/25/2019    Priority: High  . OSA on CPAP     Priority: High  . Crohn disease ([place]) 06/15/2017    Priority: High  . ILD (interstitial lung disease) ([place]) 05/29/2017    Priority: High  . Dyslipidemia 09/24/2019    Priority: Medium  . History of polycythemia vera 11/12/2014    Priority: Medium  . Chronic allergic rhinitis 01/10/2020    Priority: Low  . Benign neoplasm of rectum    Current Meds  Medication  Sig  . aspirin EC 81 MG tablet Take 81 mg by mouth daily.  . B Complex-C (B-COMPLEX WITH VITAMIN C) tablet Take 1 tablet by mouth once a week.  . Biotin 5000 MCG SUBL Place 5,000 mcg under the tongue daily.  . blood glucose meter kit and supplies KIT Dispense based on patient and insurance preference. Use up to twice daily as directed. (FOR ICD-9 250.00, 250.01).  . CANNABIDIOL PO Take 1 Dose by mouth 2 (two) times daily as needed (anxiety/sleep.). CBD Oil  . cetirizine (ZYRTEC) 10 MG tablet Take 10 mg by mouth daily as needed (sinus/allergies.).  [name] cetirizine-pseudoephedrine (ZYRTEC-D) 5-120 MG tablet Take 1 tablet by mouth daily as needed (sinus headaches.).  [name] Cholecalciferol (VITAMIN D3) 50 MCG (2000 UT) TABS Take 2,000 Units by mouth daily.  . clonazePAM (KLONOPIN) 0.5 MG tablet Take 0.5-1 tablets (0.25-0.5 mg total) by mouth daily as needed (for anxiety or sleep.).  [name] Cyanocobalamin 1000 MCG/15ML LIQD Take by mouth.  [name] FLUoxetine (PROZAC) 10 MG capsule Take 1 capsule (10 mg total) by mouth daily.  . fluticasone (FLONASE) 50 MCG/ACT nasal spray Place 1-2 sprays into both nostrils daily as needed for allergies or rhinitis.  [name] glucose blood (ONETOUCH VERIO) test strip 1 EACH BY OTHER ROUTE 2 (TWO) TIMES A DAY.  [name] Glycerin-Polysorbate 80 (REFRESH  DRY EYE THERAPY OP) Place 1 drop into both eyes 3 (three) times daily as needed (dry/irritated eyes.).  . hydrocortisone (PROCTOZONE-HC) 2.5 % rectal cream Place 1 application rectally 2 (two) times daily as needed for hemorrhoids or anal itching. (Patient taking differently: Place 1 application rectally 2 (two) times daily as needed for hemorrhoids or anal itching. Crohn's disease)  . ibuprofen (ADVIL) 200 MG tablet Take 400-800 mg by mouth every 8 (eight) hours as needed (pain.).  . mesalamine (PENTASA) 250 MG CR capsule Take 500 mg by mouth 4 (four) times daily as needed (crohn's disease flare ups).   . prednisoLONE acetate (PRED FORTE) 1 % ophthalmic  suspension Place 1 drop into both eyes 2 (two) times daily as needed (Uveitis).  . Probiotic Product (PROBIOTIC PO) Take 1 capsule by mouth daily.   . topiramate (TOPAMAX) 50 MG tablet Take 1 tablet (50 mg total) by mouth daily.  . traMADol-acetaminophen (ULTRACET) 37.5-325 MG tablet Take 0.5-1 tablets by mouth every 6 (six) hours as needed (for pain.).    Allergies: Patient is allergic to victoza [liraglutide]; lasix [furosemide]; metformin and related; onion; oxycodone; potassium-containing compounds; prednisone; and sulfa drugs cross reactors. Family History: Patient family history includes Anxiety disorder in her father; COPD in her brother; Diabetes Mellitus II in her brother and father; Glaucoma in her brother and mother; Heart disease in her brother; High Cholesterol in her mother; High blood pressure in her mother; Non-Hodgkin's lymphoma in her brother; Sleep apnea in her father and mother. Social History:  Patient  reports that she quit smoking about 11 years ago. Her smoking use included cigarettes. She has a 45.00 pack-year smoking history. She has never used smokeless tobacco. She reports current alcohol use. She reports that she does not use drugs.  Review of Systems: Constitutional: Negative for fever malaise or anorexia Cardiovascular: negative for chest pain Respiratory: negative for SOB or persistent cough Gastrointestinal: negative for abdominal pain  Objective  Vitals: BP 112/68 (BP Location: Right Arm, Patient Position: Sitting, Cuff Size: Large)   Pulse 93   Temp 98 F (36.7 C) (Temporal)   Ht 5' 3" (1.6 m)   Wt 257 lb 9.6 oz (116.8 kg)   SpO2 93%   BMI 45.63 kg/m  General: no acute distress , A&Ox3 Feet: flat footed. callues on big toe, beneath head of 3rd metatarsals and over left head of 5th metatarsal. No redness or drainage or warts visible. Nl sensation   Procedure Note: Paring Skin Callus: foot x 2  Indication: pain  Verbal consent obtained.  Alcohol  swab used for cleansing area of callus.  Using an #15 scalpel, the calluses: 3rd metatarsal head on right and 5th metatarsal head on left,  was bluntly pared down, core was removed. There were no complications. Pt tolerated procedure well and felt improved afterwards.      Commons side effects, risks, benefits, and alternatives for medications and treatment plan prescribed today were discussed, and the patient expressed understanding of the given instructions. Patient is instructed to call or message via MyChart if he/she has any questions or concerns regarding our treatment plan. No barriers to understanding were identified. We discussed Red Flag symptoms and signs in detail. Patient expressed understanding regarding what to do in case of urgent or emergency type symptoms.   Medication list was reconciled, printed and provided to the patient in AVS. Patient instructions and summary information was reviewed with the patient as documented in the AVS. This note was prepared with assistance   of Dragon voice recognition software. Occasional wrong-word or sound-a-like substitutions may have occurred due to the inherent limitations of voice recognition software  This visit occurred during the SARS-CoV-2 public health emergency.  Safety protocols were in place, including screening questions prior to the visit, additional usage of staff PPE, and extensive cleaning of exam room while observing appropriate contact time as indicated for disinfecting solutions.   

## 2020-03-17 ENCOUNTER — Ambulatory Visit: Payer: Medicare Other | Attending: Internal Medicine

## 2020-03-17 DIAGNOSIS — H2512 Age-related nuclear cataract, left eye: Secondary | ICD-10-CM | POA: Diagnosis not present

## 2020-03-17 DIAGNOSIS — H40013 Open angle with borderline findings, low risk, bilateral: Secondary | ICD-10-CM | POA: Diagnosis not present

## 2020-03-17 DIAGNOSIS — H353132 Nonexudative age-related macular degeneration, bilateral, intermediate dry stage: Secondary | ICD-10-CM | POA: Diagnosis not present

## 2020-03-17 DIAGNOSIS — H35013 Changes in retinal vascular appearance, bilateral: Secondary | ICD-10-CM | POA: Diagnosis not present

## 2020-03-17 DIAGNOSIS — H40051 Ocular hypertension, right eye: Secondary | ICD-10-CM | POA: Diagnosis not present

## 2020-03-17 DIAGNOSIS — Z23 Encounter for immunization: Secondary | ICD-10-CM

## 2020-03-17 NOTE — Progress Notes (Signed)
   Covid-19 Vaccination Clinic  Name:  Patty [name]    MRN: [id number] DOB: 02/19/1951  03/17/2020  [name] was observed post Covid-19 immunization for 15 minutes without incident. She was provided with Vaccine Information Sheet and instruction to access the V-Safe system.   [name] was instructed to call 911 with any severe reactions post vaccine: [name] Difficulty breathing  . Swelling of face and throat  . A fast heartbeat  . A bad rash all over body  . Dizziness and weakness   Immunizations Administered    Name Date Dose VIS Date Route   Pfizer COVID-19 Vaccine 03/17/2020  3:01 PM 0.3 mL 12/06/2019 Intramuscular   Manufacturer: [place]   Lot: [id number]   [place]: [id number]

## 2020-04-29 ENCOUNTER — Other Ambulatory Visit: Payer: Self-pay

## 2020-04-29 ENCOUNTER — Encounter: Payer: Self-pay | Admitting: Family Medicine

## 2020-04-29 ENCOUNTER — Ambulatory Visit (INDEPENDENT_AMBULATORY_CARE_PROVIDER_SITE_OTHER): Payer: Medicare Other | Admitting: Family Medicine

## 2020-04-29 VITALS — BP 118/78 | HR 82 | Temp 98.0°F | Resp 18 | Ht 63.0 in | Wt 254.6 lb

## 2020-04-29 DIAGNOSIS — Z1159 Encounter for screening for other viral diseases: Secondary | ICD-10-CM | POA: Diagnosis not present

## 2020-04-29 DIAGNOSIS — F4322 Adjustment disorder with anxiety: Secondary | ICD-10-CM | POA: Insufficient documentation

## 2020-04-29 DIAGNOSIS — G479 Sleep disorder, unspecified: Secondary | ICD-10-CM | POA: Diagnosis not present

## 2020-04-29 DIAGNOSIS — K50919 Crohn's disease, unspecified, with unspecified complications: Secondary | ICD-10-CM

## 2020-04-29 DIAGNOSIS — D510 Vitamin B12 deficiency anemia due to intrinsic factor deficiency: Secondary | ICD-10-CM | POA: Diagnosis not present

## 2020-04-29 DIAGNOSIS — J849 Interstitial pulmonary disease, unspecified: Secondary | ICD-10-CM | POA: Diagnosis not present

## 2020-04-29 DIAGNOSIS — Z862 Personal history of diseases of the blood and blood-forming organs and certain disorders involving the immune mechanism: Secondary | ICD-10-CM | POA: Diagnosis not present

## 2020-04-29 DIAGNOSIS — Z79899 Other long term (current) drug therapy: Secondary | ICD-10-CM

## 2020-04-29 DIAGNOSIS — Z9989 Dependence on other enabling machines and devices: Secondary | ICD-10-CM

## 2020-04-29 DIAGNOSIS — E785 Hyperlipidemia, unspecified: Secondary | ICD-10-CM | POA: Diagnosis not present

## 2020-04-29 DIAGNOSIS — G4733 Obstructive sleep apnea (adult) (pediatric): Secondary | ICD-10-CM | POA: Diagnosis not present

## 2020-04-29 DIAGNOSIS — R7303 Prediabetes: Secondary | ICD-10-CM

## 2020-04-29 DIAGNOSIS — E2839 Other primary ovarian failure: Secondary | ICD-10-CM

## 2020-04-29 LAB — LIPID PANEL
Cholesterol: 213 mg/dL — ABNORMAL HIGH (ref 0–200)
HDL: 56.3 mg/dL (ref >39.00)
LDL Cholesterol: 128 mg/dL — ABNORMAL HIGH (ref 0–99)
NonHDL: 156.61
Total CHOL/HDL Ratio: 4
Triglycerides: 143 mg/dL (ref 0.0–149.0)
VLDL: 28.6 mg/dL (ref 0.0–40.0)

## 2020-04-29 LAB — CBC WITH DIFFERENTIAL/PLATELET
Basophils Absolute: 0 10*3/uL (ref 0.0–0.1)
Basophils Relative: 0.4 % (ref 0.0–3.0)
Eosinophils Absolute: 0.2 10*3/uL (ref 0.0–0.7)
Eosinophils Relative: 2 % (ref 0.0–5.0)
HCT: 45.6 % (ref 36.0–46.0)
Hemoglobin: 15 g/dL (ref 12.0–15.0)
Lymphocytes Relative: 21.9 % (ref 12.0–46.0)
Lymphs Abs: 2 10*3/uL (ref 0.7–4.0)
MCHC: 32.8 g/dL (ref 30.0–36.0)
MCV: 91.1 fl (ref 78.0–100.0)
Monocytes Absolute: 0.9 10*3/uL (ref 0.1–1.0)
Monocytes Relative: 9.7 % (ref 3.0–12.0)
Neutro Abs: 6 10*3/uL (ref 1.4–7.7)
Neutrophils Relative %: 66 % (ref 43.0–77.0)
Platelets: 287 10*3/uL (ref 150.0–400.0)
RBC: 5.01 Mil/uL (ref 3.87–5.11)
RDW: 14.6 % (ref 11.5–15.5)
WBC: 9.1 10*3/uL (ref 4.0–10.5)

## 2020-04-29 LAB — POCT GLYCOSYLATED HEMOGLOBIN (HGB A1C): Hemoglobin A1C: 6.1 % — AB (ref 4.0–5.6)

## 2020-04-29 NOTE — Patient Instructions (Signed)
Please return in 6 months for recheck. AWV as well at your convenience. Medicare recommends an Annual Wellness Visit for all patients. Please schedule this to be done with our Nurse Educator, Loma Sousa. This is an informative "talk" visit; it's goals are to ensure that your health care needs are being met and to give you education regarding avoiding falls, ensuring you are not suffering from depression or problems with memory or thinking, and to educate you on Advance Care Planning. It helps me take good care of you!   I will release your lab results to you on your MyChart account with further instructions. Please reply with any questions.  Stop biotin at least 3 months prior to your next visit.    If you have any questions or concerns, please don't hesitate to send me a message via MyChart or call the office at [phone]. Thank you for visiting with [name] today! It's our pleasure caring for you.  I have ordered a mammogram and/or bone density for you as we discussed today: _0   Mammogram  _1   Bone Density  Please call the office checked below to schedule your appointment: Your appointment will at the following location  _2   [facility]      [place], [place]         _3   [facility]  [place], [place]  Make sure to wear two peace clothing  No lotions powders or deodorants the day of the appointment Make sure to bring picture ID and insurance card.  Bring list of medications you are currently taking including any supplements.

## 2020-04-29 NOTE — Progress Notes (Signed)
Subjective  Chief Complaint  Patient presents with  . Transitions Of Care    Last CPE was 10 years. She stated that she has been to many specialist but hasn't had a complete physical.   . Mammogram    10 years ago. She wanted to wait until she got vaccinated to have her Mammogram done.   . Dexa Scan    10 years ago.     HPI: Patty [name] is a 69 y.o. female who presents to [name] today for a Female Wellness Visit. She also has the concerns and/or needs as listed above in the chief complaint. These will be addressed in addition to the Health Maintenance Visit.   Wellness Visit: annual visit with health maintenance review and exam without Pap   HM: overdue for mammogram and dexa. UTD on crc screens (crohn's disease). Weight mgt remains difficult for her. No longer going to healthy weight and wellness. Discussed her diet: 1200kcal per day, high protein x 7 months w/o weight loss. Frustrated. Limited diet choices due to IR and crohn's).  Chronic disease f/u and/or acute problem visit: (deemed necessary to be done in addition to the wellness visit):  Prediabetes: stable w/o progression. No sxs of hyperglycemia. No longer wants to be on weight loss meds: met/glp-1.   Lipids: not on medications. Non fasting for labs today.  Sleep disorder that had been managed with klonopin. Trying to lessen use. Has osa on cpap. Once falls asleep, sleeps well but awakening feeling warm nightly. No h/o thyroid problems. On biotin supplements now  b12 def/pernicious anemia: levels were high. Back on IM replacment now  H/o PV. Due recheck.   ILD: worsening breathing but mainly restrictive changes due to weight gain. pulm reports ILD is stable.   Assessment  1. Prediabetes   2. ILD (interstitial lung disease) ([place])   3. Crohn's disease with complication, unspecified gastrointestinal tract location ([place])   4. Dyslipidemia   5. Sleep disorder   6. Chronic  prescription benzodiazepine use   7. Pernicious anemia   8. Morbid obesity ([place])   9. OSA on CPAP   10. History of polycythemia vera   11. Need for hepatitis C screening test   12. Hypoestrogenism   13. Adjustment disorder with anxiety      Plan  Female Wellness Visit:  Age appropriate Health Maintenance and Prevention measures were discussed with patient. Included topics are cancer screening recommendations, ways to keep healthy (see AVS) including dietary and exercise recommendations, regular eye and dental care, use of seat belts, and avoidance of moderate alcohol use and tobacco use. rec mammogram and dexa. Pt to schedule. rec AWV  BMI: discussed patient's BMI and encouraged positive lifestyle modifications to help get to or maintain a target BMI.  HM needs and immunizations were addressed and ordered. See below for orders. See HM and immunization section for updates. utd  Routine labs and screening tests ordered including cmp, cbc and lipids where appropriate.  Discussed recommendations regarding Vit D and calcium supplementation (see AVS)  Chronic disease management visit and/or acute problem visit:  Obesity: discussed diet changes. rec increasing caloric intake; avoid processed foods. Less meat if possible. Add strength training (silver sneakers). Would like to recheck thyroid but need to be off biotin. Defer till next visit  Prediabetes: stable. Diet changes as above  HLD: recheck. May warrant statin  Vit b12 def. rec every other month supplements. (IM)  Recheck cbc with h/o PV  ILD per pulm; osa stable on cpap  Continue prozac: adjustment anxiety? Follow up: 6 months for recheck   Orders Placed This Encounter  Procedures  . DG Bone Density  . Lipid panel  . CBC with Differential/Platelet  . Hepatitis C antibody  . POCT HgB A1C   No orders of the defined types were placed in this encounter.    Lifestyle: Body mass index is 45.1 kg/m. Wt Readings from Last  3 Encounters:  04/29/20 254 lb 9.6 oz (115.5 kg)  03/16/20 257 lb 9.6 oz (116.8 kg)  02/12/20 247 lb (112 kg)     Patient Active Problem List   Diagnosis Date Noted  . Sleep disorder 01/10/2020    Priority: High    Uses klonopin for sleep since 2014; sleep walking. And anxiety   . Pernicious anemia 01/10/2020    Priority: High  . Chronic prescription benzodiazepine use 01/10/2020    Priority: High  . Morbid obesity ([place]) 09/24/2019    Priority: High  . History of DVT (deep vein thrombosis), with PE 07/25/2019    Priority: High  . OSA on CPAP     Priority: High  . Crohn disease ([place]) 06/15/2017    Priority: High  . ILD (interstitial lung disease) ([place]) 05/29/2017    Priority: High  . Dyslipidemia 09/24/2019    Priority: Medium  . History of polycythemia vera 11/12/2014    Priority: Medium  . Chronic allergic rhinitis 01/10/2020    Priority: Low  . Adjustment disorder with anxiety 04/29/2020  . Benign neoplasm of rectum    Health Maintenance  Topic Date Due  . Hepatitis C Screening  Never done  . MAMMOGRAM  Never done  . TETANUS/TDAP  Never done  . DEXA SCAN  Never done  . INFLUENZA VACCINE  07/26/2020  . COLONOSCOPY  09/29/2029  . COVID-19 Vaccine  Completed  . PNA vac Low Risk Adult  Completed   Immunization History  Administered Date(s) Administered  . Fluad Quad(high Dose 65+) 10/07/2019  . Influenza, High Dose Seasonal PF 08/28/2018  . Influenza-Unspecified 11/23/2017  . PFIZER SARS-COV-2 Vaccination 02/16/2020, 03/17/2020  . Pneumococcal Conjugate-13 02/13/2018  . Pneumococcal Polysaccharide-23 03/12/2019   We updated and reviewed the patient's past history in detail and it is documented below. Allergies: Patient is allergic to victoza [liraglutide]; lasix [furosemide]; metformin and related; onion; oxycodone; potassium-containing compounds; prednisone; and sulfa drugs cross reactors. Past Medical History Patient  has a past medical history of Abnormal  nuclear stress test, Amyloidosis ([place]), Crohn disease ([place]), Crohn's colitis ([place]), DVT (deep venous thrombosis) ([place]), Grade I diastolic dysfunction, Hyperlipidemia, ILD (interstitial lung disease) ([place]), OSA on CPAP, Pernicious anemia, Polycythemia vera ([place]), Pulmonary embolism ([place]) (2010), and Sleep disorder (01/10/2020). Past Surgical History Patient  has a past surgical history that includes Hip Arthroplasty (Right); Colon resection (1993); LEFT HEART CATH AND CORONARY ANGIOGRAPHY (N/A, 07/28/2017); Colonoscopy with propofol (N/A, 09/30/2019); biopsy (09/30/2019); and polypectomy (09/30/2019). Family History: Patient family history includes Anxiety disorder in her father; COPD in her brother; Diabetes Mellitus II in her brother and father; Glaucoma in her brother and mother; Heart disease in her brother; High Cholesterol in her mother; High blood pressure in her mother; Non-Hodgkin's lymphoma in her brother; Sleep apnea in her father and mother. Social History:  Patient  reports that she quit smoking about 11 years ago. Her smoking use included cigarettes. She has a 45.00 pack-year smoking history. She has never used smokeless tobacco. She reports current alcohol use. She reports  that she does not use drugs.  Review of Systems: Constitutional: negative for fever or malaise Ophthalmic: negative for photophobia, double vision or loss of vision Cardiovascular: negative for chest pain, dyspnea on exertion, or new LE swelling Respiratory: negative for persistent cough Gastrointestinal: negative for abdominal pain, change in bowel habits or melena Genitourinary: negative for dysuria or gross hematuria, no abnormal uterine bleeding or disharge Musculoskeletal: negative for new gait disturbance or muscular weakness Integumentary: negative for new or persistent rashes, no breast lumps Neurological: negative for TIA or stroke symptoms Psychiatric: negative for SI or delusions Allergic/Immunologic: negative  for hives  Patient Care Team    Relationship Specialty Notifications Start End  [name], MD PCP - General Family Medicine  01/10/20   Pyrtle, Lajuan Lines, MD Consulting Physician Gastroenterology  01/10/20   Shamleffer, Melanie Crazier, MD Consulting Physician Endocrinology  01/10/20   Brand Males, MD Consulting Physician Pulmonary Disease  01/10/20     Objective  Vitals: BP 118/78   Pulse 82   Temp 98 F (36.7 C) (Temporal)   Resp 18   Ht 5' 3" (1.6 m)   Wt 254 lb 9.6 oz (115.5 kg)   SpO2 94%   BMI 45.10 kg/m  General:  Well developed, well nourished, no acute distress , central obesity Psych:  Alert and orientedx3,normal mood and affect HEENT:  Normocephalic, atraumatic, non-icteric sclera,  supple neck without adenopathy, mass or thyromegaly Cardiovascular:  Normal S1, S2, RRR without gallop, rub or murmur, no edema Respiratory:  Good breath sounds bilaterally, CTAB with normal respiratory effort Gastrointestinal: normal bowel sounds, soft, non-tender, no noted masses. No HSM MSK: no deformities, contusions. Joints are without erythema or swelling.  Skin:  Warm, no rashes or suspicious lesions noted Neurologic:    Mental status is normal. . No tremor    Commons side effects, risks, benefits, and alternatives for medications and treatment plan prescribed today were discussed, and the patient expressed understanding of the given instructions. Patient is instructed to call or message via MyChart if he/she has any questions or concerns regarding our treatment plan. No barriers to understanding were identified. We discussed Red Flag symptoms and signs in detail. Patient expressed understanding regarding what to do in case of urgent or emergency type symptoms.   Medication list was reconciled, printed and provided to the patient in AVS. Patient instructions and summary information was reviewed with the patient as documented in the AVS. This note was prepared with assistance of  Dragon voice recognition software. Occasional wrong-word or sound-a-like substitutions may have occurred due to the inherent limitations of voice recognition software  This visit occurred during the SARS-CoV-2 public health emergency.  Safety protocols were in place, including screening questions prior to the visit, additional usage of staff PPE, and extensive cleaning of exam room while observing appropriate contact time as indicated for disinfecting solutions.

## 2020-04-30 LAB — HEPATITIS C ANTIBODY
Hepatitis C Ab: NONREACTIVE
SIGNAL TO CUT-OFF: 0.02 (ref <1.00)

## 2020-05-02 ENCOUNTER — Encounter (INDEPENDENT_AMBULATORY_CARE_PROVIDER_SITE_OTHER): Payer: Self-pay | Admitting: Family Medicine

## 2020-05-05 ENCOUNTER — Encounter: Payer: Self-pay | Admitting: Family Medicine

## 2020-05-11 ENCOUNTER — Ambulatory Visit (INDEPENDENT_AMBULATORY_CARE_PROVIDER_SITE_OTHER): Payer: Medicare Other | Admitting: Family Medicine

## 2020-05-18 ENCOUNTER — Other Ambulatory Visit: Payer: Self-pay | Admitting: Family Medicine

## 2020-05-18 DIAGNOSIS — Z1231 Encounter for screening mammogram for malignant neoplasm of breast: Secondary | ICD-10-CM

## 2020-05-18 DIAGNOSIS — E2839 Other primary ovarian failure: Secondary | ICD-10-CM

## 2020-06-06 ENCOUNTER — Other Ambulatory Visit (HOSPITAL_COMMUNITY)
Admission: RE | Admit: 2020-06-06 | Discharge: 2020-06-06 | Disposition: A | Discharge status: Home / Self Care | Payer: Medicare Other | Source: Ambulatory Visit | Attending: Internal Medicine | Admitting: Internal Medicine

## 2020-06-06 DIAGNOSIS — Z20822 Contact with and (suspected) exposure to covid-19: Secondary | ICD-10-CM | POA: Insufficient documentation

## 2020-06-06 DIAGNOSIS — Z01812 Encounter for preprocedural laboratory examination: Secondary | ICD-10-CM | POA: Insufficient documentation

## 2020-06-06 LAB — SARS CORONAVIRUS 2 (TAT 6-24 HRS): SARS Coronavirus 2: NEGATIVE

## 2020-06-08 ENCOUNTER — Other Ambulatory Visit: Payer: Self-pay | Admitting: Internal Medicine

## 2020-06-08 DIAGNOSIS — J849 Interstitial pulmonary disease, unspecified: Secondary | ICD-10-CM

## 2020-06-09 ENCOUNTER — Other Ambulatory Visit: Payer: Self-pay

## 2020-06-09 ENCOUNTER — Ambulatory Visit (INDEPENDENT_AMBULATORY_CARE_PROVIDER_SITE_OTHER): Payer: Medicare Other | Admitting: Internal Medicine

## 2020-06-09 DIAGNOSIS — J849 Interstitial pulmonary disease, unspecified: Secondary | ICD-10-CM

## 2020-06-09 LAB — PULMONARY FUNCTION TEST
DL/VA % pred: 70 %
DL/VA: 2.96 ml/min/mmHg/L
DLCO cor % pred: 67 %
DLCO cor: 12.89 ml/min/mmHg
DLCO unc % pred: 70 %
DLCO unc: 13.48 ml/min/mmHg
FEF 25-75 Pre: 0.86 L/sec
FEF2575-%Pred-Pre: 44 %
FEV1-%Pred-Pre: 68 %
FEV1-Pre: 1.53 L
FEV1FVC-%Pred-Pre: 82 %
FEV6-%Pred-Pre: 85 %
FEV6-Pre: 2.39 L
FEV6FVC-%Pred-Pre: 103 %
FVC-%Pred-Pre: 82 %
FVC-Pre: 2.42 L
Pre FEV1/FVC ratio: 63 %
Pre FEV6/FVC Ratio: 99 %

## 2020-06-09 NOTE — Progress Notes (Signed)
Spirometry and Dlco done today. 

## 2020-06-23 ENCOUNTER — Encounter: Payer: Self-pay | Admitting: Internal Medicine

## 2020-06-23 ENCOUNTER — Other Ambulatory Visit: Payer: Self-pay

## 2020-06-23 ENCOUNTER — Ambulatory Visit (INDEPENDENT_AMBULATORY_CARE_PROVIDER_SITE_OTHER): Payer: Medicare Other | Admitting: Internal Medicine

## 2020-06-23 VITALS — BP 126/74 | HR 77 | Temp 98.3°F | Ht 64.0 in | Wt 250.6 lb

## 2020-06-23 DIAGNOSIS — R0600 Dyspnea, unspecified: Secondary | ICD-10-CM | POA: Diagnosis not present

## 2020-06-23 DIAGNOSIS — J439 Emphysema, unspecified: Secondary | ICD-10-CM | POA: Diagnosis not present

## 2020-06-23 DIAGNOSIS — R0609 Other forms of dyspnea: Secondary | ICD-10-CM

## 2020-06-23 DIAGNOSIS — J849 Interstitial pulmonary disease, unspecified: Secondary | ICD-10-CM

## 2020-06-23 DIAGNOSIS — Z8679 Personal history of other diseases of the circulatory system: Secondary | ICD-10-CM | POA: Diagnosis not present

## 2020-06-23 DIAGNOSIS — J8410 Pulmonary fibrosis, unspecified: Secondary | ICD-10-CM

## 2020-06-23 LAB — CBC WITH DIFFERENTIAL/PLATELET
Basophils Absolute: 0 10*3/uL (ref 0.0–0.1)
Basophils Relative: 0.7 % (ref 0.0–3.0)
Eosinophils Absolute: 0.1 10*3/uL (ref 0.0–0.7)
Eosinophils Relative: 1.9 % (ref 0.0–5.0)
HCT: 43.8 % (ref 36.0–46.0)
Hemoglobin: 14.4 g/dL (ref 12.0–15.0)
Lymphocytes Relative: 19.6 % (ref 12.0–46.0)
Lymphs Abs: 1.3 10*3/uL (ref 0.7–4.0)
MCHC: 32.8 g/dL (ref 30.0–36.0)
MCV: 91.6 fl (ref 78.0–100.0)
Monocytes Absolute: 0.6 10*3/uL (ref 0.1–1.0)
Monocytes Relative: 9.2 % (ref 3.0–12.0)
Neutro Abs: 4.4 10*3/uL (ref 1.4–7.7)
Neutrophils Relative %: 68.6 % (ref 43.0–77.0)
Platelets: 273 10*3/uL (ref 150.0–400.0)
RBC: 4.77 Mil/uL (ref 3.87–5.11)
RDW: 15 % (ref 11.5–15.5)
WBC: 6.5 10*3/uL (ref 4.0–10.5)

## 2020-06-23 LAB — COMPREHENSIVE METABOLIC PANEL
ALT: 17 U/L (ref 0–35)
AST: 17 U/L (ref 0–37)
Albumin: 4.2 g/dL (ref 3.5–5.2)
Alkaline Phosphatase: 67 U/L (ref 39–117)
BUN: 24 mg/dL — ABNORMAL HIGH (ref 6–23)
CO2: 29 mEq/L (ref 19–32)
Calcium: 9.5 mg/dL (ref 8.4–10.5)
Chloride: 104 mEq/L (ref 96–112)
Creatinine, Ser: 0.82 mg/dL (ref 0.40–1.20)
GFR: 68.96 mL/min (ref >60.00)
Glucose, Bld: 103 mg/dL — ABNORMAL HIGH (ref 70–99)
Potassium: 4.5 mEq/L (ref 3.5–5.1)
Sodium: 138 mEq/L (ref 135–145)
Total Bilirubin: 0.4 mg/dL (ref 0.2–1.2)
Total Protein: 7.4 g/dL (ref 6.0–8.3)

## 2020-06-23 LAB — BRAIN NATRIURETIC PEPTIDE: Pro B Natriuretic peptide (BNP): 34 pg/mL (ref 0.0–100.0)

## 2020-06-23 LAB — D-DIMER, QUANTITATIVE: D-Dimer, Quant: 0.34 mcg/mL FEU (ref <0.50)

## 2020-06-23 NOTE — Progress Notes (Signed)
_0  ID: [name], female    DOB: 1951/10/25, 69 y.o.   MRN: [id number]  Chief Complaint  Patient presents with  . Follow-up    dyspnea     Referring provider: No ref. provider found  HPI: 69 year old female former smoker seen for pulmonary consulokay guarded because then maybe they can meet him right now for the registryt 05/09/2017 for progressive dyspnea since 2004.[name] Patient has polycythemia vera followed by hematology and [place] She has Crohn's disease    IOV  05/09/2017  Chief Complaint  Patient presents with  . Pulmonary Consult    Pt referred for SOB with activity x years. Pt states over the last year the DOE has worsened. Pt denies cough and CP/tightness and f/c/s.     69 year old obese female. In 2004 Started noticing insidious onset of shortness of breath. In 2005 more [place] from [place] and probably gain over 60 pounds of weight. She has extensive workup at that time according to her history. She was then diagnosed with polycythemia by Dr. [name] at [facility]. She does not recollect any phlebotomies for this. However in the last 4-5 years she's had worsening shortness of breath. Noticeable with exertion particularly in the gym and doing standing exercises but not so much with sitting exercises. Relieved by rest. When she does some yard work she notices some associated wheezing as well.  Walking desaturation test in the office she did desaturate to 88% and did get tachycardic. She says that a chest x-ray done by primary care physicians interstitial pulmonary fibrosis. I personally visualized the chest x-ray done 04/03/2017: There is some interstitial markings but it is obscured by her obesity. I'm not so certain that is definite ILD. There is no lab work in the record. There is no PFT a CT scan available for visualization     05/29/2017 Follow up : Dyspnea  Patient returns for a two-week follow-up. Patient was seen for pulmonary consult  05/09/2017 for progressive dyspnea since 2004. Patient was set up for a high resolution CT chest that showed a very mild basilar predominant subpleural reticulation and bronchiectasis which could be due to nonspecific interstitial pneumonitis.. CT did have incidental findings of enlarged pulmonary arteries and aortic and coronary calcification. Patient had pulmonary function test on May 23 that showed an FEV1 at 77%, ratio 73, FVC 81, no significant bronchodilator response, total lung capacity 100%, DLCO 52%. Overnight oximetry test did show significant desaturations. We discussed beginning oxygen at bedtime.  Patient denies significant cough. Says that she get short of breath with walking. Has daytime fatigue and low energy..  Interstitial lung disease patient questionnaire was completed as follows Patient has no previous use of Macrodantin, amiodarone, methotrexate. She has been treated with prednisone for her Crohn's in the past. Patient has had extensive travel with brief visits to multiple state and country's.. [place]. Did live in a apartment that she did notice mold for 4 years. She does have Crohn's disease and was on Mesamaline for many years. Patient says she has had a humidifier and hot tub and previous house. She has no indoor birds. She does have a dog. Patient says she was told that she had a blood clot in the past but was never found.   OV 08/15/2017  Chief Complaint  Patient presents with  . Follow-up    Pt still gets occ. SOB. Other than that, pt states that she has been doing good. Denies  any cough or CP.     Follow-up multifactorial dyspnea with interstitial lung disease concern.   Regarding interstitial lung disease concern: Mid May 2018 she had high resolution CT chest that showed interstitial lung disease very mild but unclear if possible UIP pattern. Autoimmune test was negative. Isolated reduction in diffusion capacity 50%. [employer] of chest  physicians questionnaire shows previous Crohn's disease and also mold exposure previously. Overall she feels stable  Regarding dyspnea: She underwent echocardiogram in June 2018 showed grade 1 diastolic dysfunction. Had abnormal cardiac stress test July 2018 followed by cardiac cath early August 2018 that showed normal coronary angiogram but elevated left ventricular end-diastolic pressure. Dietary therapy has been recommended but she is unaware of these results.  Overall she is here to discuss these results.  IMPRESSION: 1. Suspect very mild basilar predominant subpleural reticulation and bronchiolectasis, which may be due to nonspecific interstitial pneumonitis. 2. Aortic atherosclerosis (ICD10-170.0). Coronary artery calcification. 3. Enlarged pulmonary arteries, indicative of pulmonary arterial hypertension.   Electronically Signed   By: Lorin Picket M.D.   On: 05/13/2017 07:33   OV 10/17/2017  Chief Complaint  Patient presents with  . Follow-up    Pt states that she has had good days and bad days since last visit. States that when she was in [place] for a month, breathing was better; still became SOB but not as bad as in [place]. Pt's SOB is mainly on exertion. Denies any cough or CP.    Follow-up dyspnea that is multifactorial due to obesity, physical deconditioning and diastolic dysfunction Follow-up mild interstitial lung disease not otherwise specified  Last visit I started her on Lasix. I was only supposed to see herin 6 months or so. However she's had problems tolerating Lasix and potassium. It fluctuates between palpitations and hypertension. She feels the palpitations might be related to potassium depletion after taking Lasix. But she also tells me that the Lasix does help her dyspnea. At this point in time her condition is to see cardiologist. Off note she did send the email message asking these questions on 09/28/2017 made a reply that she tells me that she never got  the reply.   OV 02/13/2018  Chief Complaint  Patient presents with  . Follow-up    PFT done 02/05/18.  Pt states she has been doing good. Was sick in january but states she is doing better.  DME: [facility] 4L pulse    Follow-up dyspnea that is multifactorial due to obesity, physical deconditioning and diastolic dysfunction Follow-up mild interstitial lung disease not otherwise specified v concern   69 year old female with concern for interstitial lung disease.  Since her last visit she continues to do well.  She is attending pulmonary rehabilitation.  She feels better and less short of breath.  She uses oxygen with exertion at rehab saying she needs it.  She has upcoming travel in summer to [place] and wanted some flight information oxygen form filled out.  She is wondering if mesalamine that she took several years ago was the cause of interstitial lung disease or her Crohn's disease.  But overall she is stable.  Pulmonary function test shows stability and FVC since last 1 year with some improvement in DLCO.  High-resolution CT scan of the chest interpreted by thoracic radiology report suspected ILD.   OV 08/28/2018  Chief Complaint  Patient presents with  . Follow-up    PFT performed today.  Pt states she has had both good and bad days since last visit.  Pt denies any complaints of cough, SOB, or CP but states she is having some problems with congestion since she has been back from vacation. Pt has been having problems with getting O2 supplies taken care of with DME.    Follow-up dyspnea that is multifactorial due to obesity, physical deconditioning and diastolic dysfunction Follow-up concern for  mild interstitial lung disease not otherwise specified  [name] - Presents for follow-up for the above issues. Her dyspnea significantly better following a 30 pound weight loss and continued rehabilitation. She uses oxygen with rehabilitation. Today walking desaturation test 185 feet 3 laps on  room air: She dropped to 87% on the second lap. But she is feeling better. She had spirometry today that shows improvement in FVC concomitant with weight loss but no change in diffusion capacity that reflects the presence of ongoing possible ILD.She continues to lose more weight she believes that she could lose another 30 pounds before the next visit.     OV 03/12/2019  Subjective:  Patient ID: [name], female , DOB: 10-23-1951 , age 38 y.o. , MRN: [id number] , ADDRESS: [place] [id number]   03/12/2019 -   Chief Complaint  Patient presents with  . Follow-up    PFT performed today.  Pt states she has been doing good since last visit. States she still becomes SOB with exertion, and has phlegm in the mornings, postnasal drainage which has used flonase to help. Pt also uses POC as needed.    Follow-up dyspnea that is multifactorial due to obesity, physical deconditioning and diastolic dysfunction Follow-up  mild interstitial lung disease not otherwise specifieds -with stability 2018 through 2020 [non-IPF pattern]; on observation   HPI Alante Weimann 69 y.o. -presents for follow-up.  She is attending pulmonary rehabilitation and using oxygen with exertion.  Overall she feels stable.  Subjective symptom parameters and objective parameters are documented below.  She had high-resolution CT chest that showed shows that she has ILD.  At this point we can be confident that she has mild ILD but it is stable based on symptoms walking test and pulmonary function test.  The previous autoimmune test was negative.    OV 10/07/2019  Subjective:  Patient ID: [name], female , DOB: 06-Apr-1951 , age 49 y.o. , MRN: [id number] , ADDRESS: [place] [id number]   10/07/2019 -   Chief Complaint  Patient presents with  . Follow-up     Follow-up dyspnea that is multifactorial due to obesity, physical deconditioning and diastolic dysfunction Follow-up   mild interstitial lung disease not otherwise specifieds -with stability 2018 through 2020 [non-IPF pattern]; on observation Follow-up mild pulmonary emphysema present on high-resolution CT chest  HPI Fronie Holstein 69 y.o. -presents for follow-up.  Prior visit was pre-pandemic.  Since then she has been sedentary at home.  She tells me that she is more short of breath.  The symptom scores below reflect that.  She thinks is because of the 30 pound weight gain because of sedentary living and social isolation following the onset of the pandemic.  She is frustrated by this.  Deep down she thinks her ILD is stable.  Pulmonary function test shows a drop in FVC but stability and DLCO suggesting weight gain is an ongoing issue for worsening dyspnea and pulmonary function test.  She is willing to have a high-dose flu shot today.  She also tells me that she is not taking her Spiriva because it was expensive.  Although it did help her she is willing to try again and try to price it.  She also stated that her mom who never smoked had emphysema but her dad smoked heavily.  She has never been tested for alpha-1.   [place] -dropped. But tries not to use it in public    IMPRESSION: 1. Very mild scattered basilar subpleural reticulation and ground-glass similar to 01/29/2018. Nonspecific interstitial pneumonitis can not be excluded. Findings are indeterminate for UIP per consensus guidelines: Diagnosis of Idiopathic Pulmonary Fibrosis: An Official ATS/ERS/JRS/ALAT Clinical Practice Guideline. [place], Iss 5, [id number], Aug 26 2017. 2. Question cirrhosis. 3. Aortic atherosclerosis (ICD10-170.0). Coronary artery calcification. 4. Enlarged arteries, arterial hypertension. 5.  Emphysema (ICD10-J43.9).   Electronically Signed   By: Lorin Picket M.D.   On: 03/06/2019 16:59   ROS - per HPI     OV 06/23/2020  Subjective:  Patient ID: [name], female  , DOB: 12-02-1951 , age 30 y.o. , MRN: [id number] , ADDRESS: [place] [id number]   06/23/2020 -   Chief Complaint  Patient presents with  . Follow-up    shortness of breath with exertion   Follow-up dyspnea that is multifactorial due to obesity, physical deconditioning and diastolic dysfunction  Follow-up  mild interstitial lung disease not otherwise specifieds -with stability 2018 through 2020 [non-IPF pattern/indeterminate   -; on observation  - last CT Mach 2020  Follow-up mild pulmonary emphysema present on high-resolution CT chest - alpha 1 MM  - o2 with exertion  - advised spiriva oct 2020 but too expensive  HPI Carlyon Nolasco 69 y.o. -returns for routine follow-up.  She says she has been doing well.  She uses portable oxygen cranks it up to 4 L - 5 L and then walks every day for 30 minutes.  Overall she feels stable.  Her symptom score itself is stable.  However on pulmonary function testing her FVC shows a decline.  This decline started in October 2020 and since then it is stable.  She attributes this to weight gain.  There is what she told me last time as well.  However when we walked her she seemed to desaturate much more easily.  We also needed to corrected desaturation at 5 L.  She thinks it is because the portable oxygen system is not delivering oxygen currently and is a technical issue with a portable machine.  However the distance of desaturation seems to worsen.  There is no leg swelling although she has some mild varicose veins at baseline.  She is not on nighttime oxygen and she recollects attest that this some years ago and was normal back then.  Currently she is only using portable oxygen.  Her last echocardiogram was few years ago.  There is no hemoptysis or worsening cough.  She got a Covid vaccine in February 2021 and she thinks things might of changed since then.   SYMPTOM SCALE - ILD 03/12/2019  10/07/2019 30# wt gain  06/23/2020 250#  O2 use  RA ra   Shortness of Breath 0 -> 5 scale with 5 being worst (score 6 If unable to do)    At rest 0 0 0  Simple tasks - showers, clothes change, eating, shaving 0 0   Household (dishes, doing bed, laundry) 0 0 0  Shopping 0 0 0  Walking at own pace 0 4.5 4.5  Walking up Stairs 1-_0 Total (40 -  48) Dyspnea Score 2 9.5 9.5  How bad is your cough? 0 0 0  How bad is your fatigue 0 1 0  nausea   0  vomit   0  diarrhe   0  axniety   3  depression   0        Simple office walk 185 feet x  3 laps goal with forehead probe 03/12/2019  10/07/2019  06/23/2020   O2 used Room air Room air Room air  Number laps completed 3 2nd lap 1 lap  Comments about pace slow slow slow  Resting Pulse Ox/HR 100% and 85/min 96% and 72/min 97% and 77  Final Pulse Ox/HR 88% and 135/min 86% and 105/min 87% and 112/min  Desaturated </= 88% yes    Desaturated <= 3% points Yes, 12    Got Tachycardic >/= 90/min yes    Symptoms at end of test Mild dyspnea  Corrected with 5L [place]. Even with 4L [place] downt to  87%  Miscellaneous comments x  ? worse    Results for MELAINA, HOWERTON ANN (MRN [id number]) as of 06/23/2020 11:49  Ref. Range 05/17/2017 13:11 02/05/2018 12:14 08/28/2018 11:22 03/12/2019 14:47 10/01/2019 15:58 30 pound weight gain 06/09/2020 11:25  FVC-Pre Latest Units: L 2.61 2.57 2.80 2.73 2.38 2.42  FVC-%Pred-Pre Latest Units: % 84 83 94 91 81 82  FEV1-Pre Latest Units: L 1.83 1.71 1.96 1.83 1.65 1.53   Results for LAKODA, RASKE ANN (MRN [id number]) as of 06/23/2020 11:49  Ref. Range 05/17/2017 13:11 02/05/2018 12:14 08/28/2018 11:22 03/12/2019 14:47 10/01/2019 15:58 06/09/2020 11:25  DLCO unc Latest Units: ml/min/mmHg 12.64 15.15 14.53 13.77 14.39 13.48  DLCO unc % pred Latest Units: % 52 62 62 72 75 70    ROS - per HPI     has a past medical history of Abnormal nuclear stress test, Amyloidosis ([place]), Crohn disease ([place]), Crohn's colitis ([place]), DVT (deep venous thrombosis) ([place]), Grade I diastolic  dysfunction, Hyperlipidemia, ILD (interstitial lung disease) ([place]), OSA on CPAP, Pernicious anemia, Polycythemia vera ([place]), Pulmonary embolism ([place]) (2010), and Sleep disorder (01/10/2020).   reports that she quit smoking about 11 years ago. Her smoking use included cigarettes. She has a 45.00 pack-year smoking history. She has never used smokeless tobacco.  Past Surgical History:  Procedure Laterality Date  . BIOPSY  09/30/2019   Procedure: BIOPSY;  Surgeon: Jerene Bears, MD;  Location: [facility];  Service: Gastroenterology;;  . [place]  . COLONOSCOPY WITH PROPOFOL N/A 09/30/2019   Procedure: COLONOSCOPY WITH PROPOFOL;  Surgeon: Jerene Bears, MD;  Location: [facility];  Service: Gastroenterology;  Laterality: N/A;  . HIP ARTHROPLASTY Right    x 2, initial right THA, then subsequent right THA revision   . LEFT HEART CATH AND CORONARY ANGIOGRAPHY N/A 07/28/2017   Procedure: Left Heart Cath and Coronary Angiography;  Surgeon: Burnell Blanks, MD;  Location: [facility];  Service: Cardiovascular;  Laterality: N/A;  . POLYPECTOMY  09/30/2019   Procedure: POLYPECTOMY;  Surgeon: Jerene Bears, MD;  Location: [facility];  Service: Gastroenterology;;    Allergies  Allergen Reactions  . Victoza [Liraglutide] Shortness Of Breath  . Lasix [Furosemide] Itching    Hypersensitivity   . Metformin And Related Diarrhea    Increased moodiness  . Onion Other (See Comments)    sereve diarrhea, stomach pains (Crohn's flares)  . Oxycodone Other (See Comments)    Altered mental status  . Potassium-Containing Compounds Other (See Comments)  Due to chron's, difficult passing though kidneys not allowing absorption in the body   . Prednisone Other (See Comments)    Altered mood  . Sulfa Drugs Cross Reactors Other (See Comments)    "feels like bugs crawling on me"    Immunization History  Administered Date(s) Administered  . Fluad Quad(high Dose 65+) 10/07/2019  .  Influenza, High Dose Seasonal PF 08/28/2018  . Influenza-Unspecified 11/23/2017  . PFIZER SARS-COV-2 Vaccination 02/16/2020, 03/17/2020  . Pneumococcal Conjugate-13 02/13/2018  . Pneumococcal Polysaccharide-23 03/12/2019    Family History  Problem Relation Age of Onset  . Glaucoma Mother   . High blood pressure Mother   . High Cholesterol Mother   . Sleep apnea Mother   . Diabetes Mellitus II Father   . Sleep apnea Father   . Anxiety disorder Father   . COPD Brother   . Heart disease Brother   . Non-Hodgkin's lymphoma Brother   . Diabetes Mellitus II Brother   . Glaucoma Brother   . Colon cancer Neg Hx   . Esophageal cancer Neg Hx   . Pancreatic cancer Neg Hx   . Stomach cancer Neg Hx   . Liver disease Neg Hx      Current Outpatient Medications:  .  aspirin EC 81 MG tablet, Take 81 mg by mouth daily., Disp: , Rfl:  .  B Complex-C (B-COMPLEX WITH VITAMIN C) tablet, Take 1 tablet by mouth once a week., Disp: , Rfl:  .  CANNABIDIOL PO, Take 1 Dose by mouth 2 (two) times daily as needed (anxiety/sleep.). CBD Oil, Disp: , Rfl:  .  cetirizine (ZYRTEC) 10 MG tablet, Take 10 mg by mouth daily as needed (sinus/allergies.)., Disp: , Rfl:  .  cetirizine-pseudoephedrine (ZYRTEC-D) 5-120 MG tablet, Take 1 tablet by mouth daily as needed (sinus headaches.)., Disp: , Rfl:  .  Cholecalciferol (VITAMIN D3) 50 MCG (2000 UT) TABS, Take 2,000 Units by mouth daily., Disp: , Rfl:  .  clonazePAM (KLONOPIN) 0.5 MG tablet, Take 0.5-1 tablets (0.25-0.5 mg total) by mouth daily as needed (for anxiety or sleep.)., Disp: 90 tablet, Rfl: 1 .  Cyanocobalamin (VITAMIN B-12 IJ), Inject 1,000 mcg as directed every 30 (thirty) days., Disp: , Rfl:  .  FLUoxetine (PROZAC) 10 MG capsule, Take 1 capsule (10 mg total) by mouth daily., Disp: 90 capsule, Rfl: 3 .  fluticasone (FLONASE) 50 MCG/ACT nasal spray, Place 1-2 sprays into both nostrils daily as needed for allergies or rhinitis., Disp: , Rfl:  .   Glycerin-Polysorbate 80 (REFRESH DRY EYE THERAPY OP), Place 1 drop into both eyes 3 (three) times daily as needed (dry/irritated eyes.)., Disp: , Rfl:  .  hydrocortisone (PROCTOZONE-HC) 2.5 % rectal cream, Place 1 application rectally 2 (two) times daily as needed for hemorrhoids or anal itching. (Patient taking differently: Place 1 application rectally 2 (two) times daily as needed for hemorrhoids or anal itching. Crohn's disease), Disp: 30 g, Rfl: 1 .  ibuprofen (ADVIL) 200 MG tablet, Take 400-800 mg by mouth every 8 (eight) hours as needed (pain.)., Disp: , Rfl:  .  mesalamine (PENTASA) 250 MG CR capsule, Take 250 mg by mouth 4 (four) times daily as needed (crohn's disease flare ups). , Disp: , Rfl:  .  Multiple Vitamins-Minerals (ICAPS AREDS 2 PO), Take 1 tablet by mouth daily., Disp: , Rfl:  .  prednisoLONE acetate (PRED FORTE) 1 % ophthalmic suspension, Place 1 drop into both eyes 2 (two) times daily as needed (Uveitis)., Disp: , Rfl:  .  Probiotic Product (PROBIOTIC PO), Take 1 capsule by mouth 2 (two) times a week. , Disp: , Rfl:  .  Biotin 5000 MCG SUBL, Place 5,000 mcg under the tongue 2 (two) times a week. , Disp: , Rfl:  .  traMADol-acetaminophen (ULTRACET) 37.5-325 MG tablet, Take 0.5-1 tablets by mouth every 6 (six) hours as needed (for pain.)., Disp: , Rfl:       Objective:   Vitals:   06/23/20 1130  BP: 126/74  Pulse: 77  Temp: 98.3 F (36.8 C)  TempSrc: Oral  SpO2: 96%  Weight: 250 lb 9.6 oz (113.7 kg)  Height: 5' 4" (1.626 m)    Estimated body mass index is 43.02 kg/m as calculated from the following:   Height as of this encounter: 5' 4" (1.626 m).   Weight as of this encounter: 250 lb 9.6 oz (113.7 kg).  _0 @  Filed Weights   06/23/20 1130  Weight: 250 lb 9.6 oz (113.7 kg)     Physical Exam obese female.  Mallampati class III.  Faint basal crackles possibly but hard to auscultate given obesity normal heart sounds abdomen visceral obesity present no  cyanosis no clubbing no edema but has mild baseline varicosities.        Assessment:       ICD-10-CM   1. Pulmonary emphysema with fibrosis of lung ([place])  J43.9    J84.10   2. History of diastolic dysfunction  V76.16 ECHOCARDIOGRAM COMPLETE  3. ILD (interstitial lung disease) ([place])  J84.9 CBC with Differential/Platelet    Comp Met (CMET)    D-Dimer, Quantitative    Pulse oximetry, overnight    D-Dimer, Quantitative    Comp Met (CMET)    CBC with Differential/Platelet  4. Pulmonary emphysema, unspecified emphysema type ([place])  J43.9   5. Dyspnea on exertion  R06.00 B Nat Peptide    ECHOCARDIOGRAM COMPLETE    B Nat Peptide  6. Interstitial pulmonary disease (H[facility])  J84.9 CT Chest High Resolution   Hard for me to tell if she is gotten worse but she is definitely desaturating with exertion and the distance of desaturation seems to have gotten worse.  She thinks because of weight gain in which case this could be weight gain related worsening diastolic dysfunction.  On the other hand she could have ILD that is progressed.  We need to sort this out.  We will start getting overnight oxygen study on room air.  Will get high-resolution CT scan of the chest supine and prone.  We will get 2D echo.  We will get blood work including BNP and D-dimer.  Depending on the results she might need a right heart catheterization with exercise in case diastolic dysfunction is high suspicion  If ILD is getting worse then will need to consider antifibrotic's with or without surgical lung biopsy or transbronchial biopsy    Plan:     Patient Instructions     ICD-10-CM   1. Pulmonary emphysema with fibrosis of lung ([place])  J43.9    J84.10   2. History of diastolic dysfunction  W73.71   3. ILD (interstitial lung disease) ([place])  J84.9   4. Pulmonary emphysema, unspecified emphysema type ([place])  J43.9   5. Dyspnea on exertion  R06.00    I am concerned that you are having worsening shortness of breath that  might be linked to worsening pulmonary fibrosis as opposed to increased weight gain  Plan -Check CBC, chemistry, D-dimer, BNP today blood work  =-Do 2D echocardiogram  of the heart [last one was a few years ago] -Do high-resolution CT scan of the chest supine and prone -Do overnight oxygen study on room air -Continue daily exercises with 5 L oxygen at exertion through portable machine -You can definitely call your DME company to make sure your portable oxygen device is functioning properly  Followup  = 2-3 weeks but after completing above - with app to [facility]view tests (tele or face to face)  She is agreeable to the plan   (Level 04: Estb 30-39 min   visit type: on-site physical face to visit visit spent in total care time and counseling or/and coordination of care by this undersigned MD - Dr Brand Males. This includes one or more of the following on this same day 06/23/2020: pre-charting, chart review, note writing, documentation discussion of test results, diagnostic or treatment recommendations, prognosis, risks and benefits of management options, instructions, education, compliance or risk-factor reduction. It excludes time spent by the [place] or office staff in the care of the patient . Actual time is 30 min)    SIGNATURE    Dr. Brand Males, M.D., F.C.C.P,  Pulmonary and Critical Care Medicine Staff Physician, [place] Director - Interstitial Lung Disease  Program  Pulmonary [place] [facility] [place], [place], [address]  Pager: [phone], If no answer or between  15:00h - 7:00h: call [phone] Telephone: [phone]  12:29 PM 06/23/2020

## 2020-06-23 NOTE — Patient Instructions (Addendum)
ICD-10-CM   1. Pulmonary emphysema with fibrosis of lung ([place])  J43.9    J84.10   2. History of diastolic dysfunction  L84.53   3. ILD (interstitial lung disease) ([place])  J84.9   4. Pulmonary emphysema, unspecified emphysema type ([place])  J43.9   5. Dyspnea on exertion  R06.00    I am concerned that you are having worsening shortness of breath that might be linked to worsening pulmonary fibrosis as opposed to increased weight gain  Plan -Check CBC, chemistry, D-dimer, BNP today blood work  =-Do 2D echocardiogram of the heart [last one was a few years ago] -Do high-resolution CT scan of the chest supine and prone -Do overnight oxygen study on room air -Continue daily exercises with 5 L oxygen at exertion through portable machine -You can definitely call your DME company to make sure your portable oxygen device is functioning properly  Followup  = 2-3 weeks but after completing above - with app to rview tests (tele or face to face)

## 2020-07-01 ENCOUNTER — Encounter: Payer: Self-pay | Admitting: Internal Medicine

## 2020-07-01 DIAGNOSIS — J449 Chronic obstructive pulmonary disease, unspecified: Secondary | ICD-10-CM | POA: Diagnosis not present

## 2020-07-01 DIAGNOSIS — R0902 Hypoxemia: Secondary | ICD-10-CM | POA: Diagnosis not present

## 2020-07-10 ENCOUNTER — Ambulatory Visit
Admission: RE | Admit: 2020-07-10 | Discharge: 2020-07-10 | Disposition: A | Discharge status: Home / Self Care | Payer: Medicare Other | Source: Ambulatory Visit | Attending: Internal Medicine | Admitting: Internal Medicine

## 2020-07-10 DIAGNOSIS — J84112 Idiopathic pulmonary fibrosis: Secondary | ICD-10-CM | POA: Diagnosis not present

## 2020-07-10 DIAGNOSIS — J849 Interstitial pulmonary disease, unspecified: Secondary | ICD-10-CM

## 2020-07-10 DIAGNOSIS — R0600 Dyspnea, unspecified: Secondary | ICD-10-CM | POA: Diagnosis not present

## 2020-07-10 DIAGNOSIS — I272 Pulmonary hypertension, unspecified: Secondary | ICD-10-CM | POA: Diagnosis not present

## 2020-07-10 DIAGNOSIS — I288 Other diseases of pulmonary vessels: Secondary | ICD-10-CM | POA: Diagnosis not present

## 2020-07-10 DIAGNOSIS — I700 Atherosclerosis of aorta: Secondary | ICD-10-CM | POA: Diagnosis not present

## 2020-07-13 ENCOUNTER — Ambulatory Visit: Payer: Medicare Other | Admitting: Primary Care

## 2020-07-15 ENCOUNTER — Other Ambulatory Visit (HOSPITAL_COMMUNITY): Payer: Medicare Other

## 2020-07-17 ENCOUNTER — Other Ambulatory Visit: Payer: Self-pay

## 2020-07-17 ENCOUNTER — Ambulatory Visit (HOSPITAL_COMMUNITY): Payer: Medicare Other | Attending: Cardiology

## 2020-07-17 DIAGNOSIS — R0600 Dyspnea, unspecified: Secondary | ICD-10-CM | POA: Diagnosis not present

## 2020-07-17 DIAGNOSIS — Z8679 Personal history of other diseases of the circulatory system: Secondary | ICD-10-CM | POA: Diagnosis not present

## 2020-07-17 DIAGNOSIS — R0609 Other forms of dyspnea: Secondary | ICD-10-CM

## 2020-07-17 LAB — ECHOCARDIOGRAM COMPLETE
Area-P 1/2: 2.94 cm2
S' Lateral: 2.5 cm

## 2020-07-17 MED ORDER — PERFLUTREN LIPID MICROSPHERE
1.0000 mL | INTRAVENOUS | Status: AC | PRN
  Administered 2020-07-17: 2 mL via INTRAVENOUS

## 2020-07-20 ENCOUNTER — Telehealth: Payer: Self-pay | Admitting: Internal Medicine

## 2020-07-20 NOTE — Telephone Encounter (Signed)
-  ono on ra 07/01/20 -</= 88% is 30 min. Plan - sart [facility]  - echo is normal but for mild heart muscle stiffness

## 2020-07-21 NOTE — Telephone Encounter (Signed)
Attempted to call pt but line went straight to VM. Left message for her to return call.

## 2020-07-23 NOTE — Telephone Encounter (Signed)
Attempted to call pt x2 but unable to reach. Left message for her to return call.

## 2020-07-27 NOTE — Telephone Encounter (Signed)
Pt does have a f/u scheduled with Beth 8/3. Routing this to her as an FYI that we have not been able to reach pt to discuss the results of the ONO with her and also in regards to plan per MR.

## 2020-07-27 NOTE — Telephone Encounter (Signed)
Thanks, we can discuss at visit

## 2020-07-28 ENCOUNTER — Other Ambulatory Visit: Payer: Self-pay

## 2020-07-28 ENCOUNTER — Encounter: Payer: Self-pay | Admitting: Primary Care

## 2020-07-28 ENCOUNTER — Telehealth: Payer: Self-pay | Admitting: Primary Care

## 2020-07-28 ENCOUNTER — Ambulatory Visit (INDEPENDENT_AMBULATORY_CARE_PROVIDER_SITE_OTHER): Payer: Medicare Other | Admitting: Primary Care

## 2020-07-28 DIAGNOSIS — G4733 Obstructive sleep apnea (adult) (pediatric): Secondary | ICD-10-CM | POA: Diagnosis not present

## 2020-07-28 DIAGNOSIS — Z9989 Dependence on other enabling machines and devices: Secondary | ICD-10-CM | POA: Diagnosis not present

## 2020-07-28 DIAGNOSIS — J849 Interstitial pulmonary disease, unspecified: Secondary | ICD-10-CM | POA: Diagnosis not present

## 2020-07-28 DIAGNOSIS — G4734 Idiopathic sleep related nonobstructive alveolar hypoventilation: Secondary | ICD-10-CM | POA: Diagnosis not present

## 2020-07-28 NOTE — Telephone Encounter (Signed)
Patient of yours with ILD. You ordered testing d/t dyspnea and oxygen desaturating. HRCT did not show progression for ILD. Labs essentially normal. Echo showed LVH and grade 1 dd. ONO patient spend 30 mins <88%, starting on 2L nocturnal oxygen with CPAP  Do you want me to refer her to cardiology?

## 2020-07-28 NOTE — Progress Notes (Signed)
Virtual Visit via Telephone Note  I connected with Patty [name] on 07/28/20 at  2:30 PM EDT by telephone and verified that I am speaking with the correct person using two identifiers.  Location: Patient: Home Provider: Office   I discussed the limitations, risks, security and privacy concerns of performing an evaluation and management service by telephone and the availability of in person appointments. I also discussed with the patient that there may be a patient responsible charge related to this service. The patient expressed understanding and agreed to proceed.   History of Present Illness: 69 year old female, former smoker (45 pack yx). PMH significant for ILD, chronic allergic rhintiis, OSA on CPAP, emphysema, crohns disease, obesity. Patient of Dr. Donnie Mesa, last seen on 06/23/20  Previous [facility] pulmonary encounter: 06/23/20 Dr. Beatrix Fetters 69 y.o. -returns for routine follow-up.  She says she has been doing well.  She uses portable oxygen cranks it up to 4 L - 5 L and then walks every day for 30 minutes.  Overall she feels stable.  Her symptom score itself is stable.  However on pulmonary function testing her FVC shows a decline.  This decline started in October 2020 and since then it is stable.  She attributes this to weight gain.  There is what she told me last time as well.  However when we walked her she seemed to desaturate much more easily.  We also needed to corrected desaturation at 5 L.  She thinks it is because the portable oxygen system is not delivering oxygen currently and is a technical issue with a portable machine.  However the distance of desaturation seems to worsen.  There is no leg swelling although she has some mild varicose veins at baseline.  She is not on nighttime oxygen and she recollects attest that this some years ago and was normal back then.  Currently she is only using portable oxygen.  Her last echocardiogram was few years ago.  There is no  hemoptysis or worsening cough.  She got a Covid vaccine in February 2021 and she thinks things might of changed since then.  07/28/2020 - interim hx Patient contacted today for 1 month follow. During last visit she was noted to be desaturating with exertoin. Patient felt this was d/t weight gain which could be worsening diastolic dysfunction. On the other hand she could have progression of ILD. She was ordered for ONO on RA, high resolution CT,  and 2D echocardiogram. CBC, CMET, BNP and D-dimer were all within normal limits.   Received Covid vaccine in March 2021. Her shortness of breath worsened around the same time. States that she needs to stop 6-8 times during 1/2 mile walk. She wears 5L on portable oxygen concentration when she exercises. HRCT did not show any progression of her fibrosis. Echocardiogram showed normal EF, moderate LVH and grade 1 DD. ONO showed that she spend 30 min with less than 88% RA. Recommend 2L [place] at night to be blended into her CPAP.  Observations/Objective:  - Appear well able to speak in full sentence   Imaging: 07/10/20 HRCT- Spectrum of findings suggestive of a mild basilar predominant fibrotic interstitial lung disease without appreciable interval progression since baseline 05/12/2017 high-resolution chest CT. Favor NSIP, with UIP not excluded. Dilated main pulmonary artery, stable, suggesting chronic pulmonary arterial hypertension. Aortic Atherosclerosis (ICD10-I70.0).  Cardiac testing:  Echocardiogram- EF 60-65%, moderate left ventricular hypertrophy, grade 1 diastolic dysfunction   Pulmonary testing: 06/23/20 Overnight oximetry - Spent  30 mins with O2 <88% while on CPAP  Assessment and Plan:  Dyspnea: - Reports increased shortness of breath after COVID vaccine in March 2021 - Testing was mostly normal. HRCT did not show progression of ILD. Echocardiogram showed moderate left ventricular hypertrophy and grade 1 diastolic dysfunction.  I will discuss  with Dr. Chase Caller to see if he wants [name] to refer you back to cardiology   OSA/Nocturnal hypoxemia: - ONO showed that she spent 30 min with O2 <88% on CPAP.  - Recommend 2L nocturnal oxygen blended into CPAP  Follow Up Instructions:   - 6 months with Dr. Chase Caller  I discussed the assessment and treatment plan with the patient. The patient was provided an opportunity to ask questions and all were answered. The patient agreed with the plan and demonstrated an understanding of the instructions.   The patient was advised to call back or seek an in-person evaluation if the symptoms worsen or if the condition fails to improve as anticipated.  I provided 22 minutes of non-face-to-face time during this encounter.   Martyn Ehrich, NP

## 2020-07-28 NOTE — Patient Instructions (Addendum)
Testing was mostly normal. HRCT did not show progression of ILD. Echocardiogram showed moderate left ventricular hypertrophy and grade 1 diastolic dysfunction. ONO showed that you spent 30 min with O2 <88% on CPAP. Starting you on 2L nocturnal oxygen to be blended into CPAP. I will discuss with Dr. Chase Caller to see if he wants [name] to refer you back to cardiology   Orders: Needs order for nocturnal oxygen 2L with CPAP   Follow-up: 6 months with Dr. Chase Caller

## 2020-07-30 NOTE — Telephone Encounter (Signed)
Called and left message for patient to return call.  

## 2020-07-30 NOTE — Telephone Encounter (Signed)
She had heart cath in 2018 with elevated pressures. SO  A lot of this is all Diast Dysfn. She can follow with cardiology per their prior advice. She needs to lose weight, lasix, control bP. Upto her if she wants to do this with cards or PCP [name], MD   Wiol close message. Reply not needed

## 2020-07-30 NOTE — Telephone Encounter (Signed)
Can you let patient know I discussed with MR. We can refer her to cardiology or she can follow up with PCP. Recommend weight loss efforts, possible diuretics and control BP. Thanks

## 2020-08-04 ENCOUNTER — Other Ambulatory Visit: Payer: Self-pay

## 2020-08-04 ENCOUNTER — Ambulatory Visit
Admission: RE | Admit: 2020-08-04 | Discharge: 2020-08-04 | Disposition: A | Discharge status: Home / Self Care | Payer: Medicare Other | Source: Ambulatory Visit | Attending: Family Medicine | Admitting: Family Medicine

## 2020-08-04 DIAGNOSIS — Z1231 Encounter for screening mammogram for malignant neoplasm of breast: Secondary | ICD-10-CM | POA: Diagnosis not present

## 2020-08-04 DIAGNOSIS — Z780 Asymptomatic menopausal state: Secondary | ICD-10-CM | POA: Diagnosis not present

## 2020-08-04 DIAGNOSIS — E2839 Other primary ovarian failure: Secondary | ICD-10-CM

## 2020-08-04 DIAGNOSIS — M8589 Other specified disorders of bone density and structure, multiple sites: Secondary | ICD-10-CM | POA: Diagnosis not present

## 2020-08-05 ENCOUNTER — Other Ambulatory Visit: Payer: Self-pay | Admitting: Family Medicine

## 2020-08-05 ENCOUNTER — Encounter: Payer: Self-pay | Admitting: Family Medicine

## 2020-08-05 DIAGNOSIS — M8580 Other specified disorders of bone density and structure, unspecified site: Secondary | ICD-10-CM | POA: Insufficient documentation

## 2020-08-05 DIAGNOSIS — R928 Other abnormal and inconclusive findings on diagnostic imaging of breast: Secondary | ICD-10-CM

## 2020-08-06 ENCOUNTER — Telehealth: Payer: Self-pay | Admitting: Primary Care

## 2020-08-06 DIAGNOSIS — Z8679 Personal history of other diseases of the circulatory system: Secondary | ICD-10-CM

## 2020-08-06 DIAGNOSIS — I517 Cardiomegaly: Secondary | ICD-10-CM

## 2020-08-06 NOTE — Telephone Encounter (Signed)
Pt returning call.  [phone].

## 2020-08-06 NOTE — Telephone Encounter (Signed)
ATC patient at requested number, [phone]. LM to call back.

## 2020-08-06 NOTE — Telephone Encounter (Signed)
Called and spoke with patient letting her know that Patty [name] talked to Dr. Chase Caller and they either wanted to do a referral to Cardiology or for her to follow up with her PCP. Patient states she would like to have referral placed. States she has seen Dr. Johnsie Cancel in the past and would like to be sent back to his office. Referral placed. Nothing further needed at this time.

## 2020-08-07 ENCOUNTER — Encounter: Payer: Self-pay | Admitting: Family Medicine

## 2020-08-21 ENCOUNTER — Other Ambulatory Visit: Payer: Self-pay

## 2020-08-21 ENCOUNTER — Other Ambulatory Visit: Payer: Self-pay | Admitting: Family Medicine

## 2020-08-21 ENCOUNTER — Ambulatory Visit
Admission: RE | Admit: 2020-08-21 | Discharge: 2020-08-21 | Disposition: A | Discharge status: Home / Self Care | Payer: Medicare Other | Source: Ambulatory Visit | Attending: Family Medicine | Admitting: Family Medicine

## 2020-08-21 DIAGNOSIS — R928 Other abnormal and inconclusive findings on diagnostic imaging of breast: Secondary | ICD-10-CM

## 2020-09-01 ENCOUNTER — Ambulatory Visit
Admission: RE | Admit: 2020-09-01 | Discharge: 2020-09-01 | Disposition: A | Discharge status: Home / Self Care | Payer: Medicare Other | Source: Ambulatory Visit | Attending: Family Medicine | Admitting: Family Medicine

## 2020-09-01 ENCOUNTER — Other Ambulatory Visit: Payer: Self-pay

## 2020-09-01 DIAGNOSIS — R928 Other abnormal and inconclusive findings on diagnostic imaging of breast: Secondary | ICD-10-CM

## 2020-09-01 DIAGNOSIS — N6489 Other specified disorders of breast: Secondary | ICD-10-CM | POA: Diagnosis not present

## 2020-09-03 ENCOUNTER — Other Ambulatory Visit: Payer: Self-pay | Admitting: Surgery

## 2020-09-03 DIAGNOSIS — N6021 Fibroadenosis of right breast: Secondary | ICD-10-CM

## 2020-09-03 DIAGNOSIS — N6489 Other specified disorders of breast: Secondary | ICD-10-CM | POA: Diagnosis not present

## 2020-09-08 ENCOUNTER — Ambulatory Visit: Payer: Medicare Other

## 2020-09-09 ENCOUNTER — Encounter: Payer: Self-pay | Admitting: Family Medicine

## 2020-09-09 ENCOUNTER — Other Ambulatory Visit: Payer: Self-pay

## 2020-09-09 ENCOUNTER — Ambulatory Visit (INDEPENDENT_AMBULATORY_CARE_PROVIDER_SITE_OTHER): Payer: Medicare Other | Admitting: Family Medicine

## 2020-09-09 VITALS — BP 132/84 | HR 68 | Temp 97.5°F | Wt 256.4 lb

## 2020-09-09 DIAGNOSIS — R0902 Hypoxemia: Secondary | ICD-10-CM | POA: Diagnosis not present

## 2020-09-09 DIAGNOSIS — R0600 Dyspnea, unspecified: Secondary | ICD-10-CM

## 2020-09-09 DIAGNOSIS — J849 Interstitial pulmonary disease, unspecified: Secondary | ICD-10-CM | POA: Diagnosis not present

## 2020-09-09 DIAGNOSIS — R7301 Impaired fasting glucose: Secondary | ICD-10-CM

## 2020-09-09 DIAGNOSIS — Z23 Encounter for immunization: Secondary | ICD-10-CM

## 2020-09-09 DIAGNOSIS — R0609 Other forms of dyspnea: Secondary | ICD-10-CM

## 2020-09-09 DIAGNOSIS — D510 Vitamin B12 deficiency anemia due to intrinsic factor deficiency: Secondary | ICD-10-CM | POA: Diagnosis not present

## 2020-09-09 MED ORDER — FUROSEMIDE 20 MG PO TABS
20.0000 mg | ORAL_TABLET | Freq: Every day | ORAL | 3 refills | Status: DC | PRN
Start: 2020-09-09 — End: 2020-10-08

## 2020-09-09 MED ORDER — POTASSIUM CHLORIDE ER 10 MEQ PO CPCR: 10.0000 meq | ORAL_CAPSULE | ORAL | 3 refills | Status: DC

## 2020-09-09 NOTE — Progress Notes (Signed)
Subjective  CC:  Chief Complaint  Patient presents with  . Follow-up    recent echo preformed by pulmonology after experiecing SOB - lungs stable believes this to be heart issue    HPI: Patty [name] is a 69 y.o. female who presents to the office today to address the problems listed above in the chief complaint.  I reviewed recent records and evaluation by pulmonology: labs, pfts, echocardiogram results.  Pt with worsening DOE; pulm eval shows stable ILD although desaturating at night and nighttime o2 initiated. Wears oxygen chronically during daytime activity. No clear reason for worsening sxs so want cardiology eval. Pt appt is scheduled for October.   Patient reports that she has been trying to walk and keep active.  As she told the pulmonologist, she has to stop when she feels short of breath and she can tell when her oxygen levels are decreasing below 90.  They come back with rest.  She also has followed her heart rate and it will elevate to 160s.  She does not feel palpitations nor chest pain.  She does not understand why she has to see the cardiologist or what they were looking for.  She did report that with her last echocardiogram the cardiologist noted some stiffness of the heart and a trial of Lasix was initiated.  She has restarted that and does feel that his helped decrease the desaturations however her heart rate remains elevated.  She struggles with weight loss and feels that her is a reason she cannot lose weight.  There is also concern whether her symptoms were related to the Covid vaccination that she received back in March.  Although there were no lung findings to corroborate this.  Labs from this year showed normal CBC and thyroid studies.  She had a normal BNP.  Negative D-dimer in June.  She has history of vitamin B12 deficiency/pernicious anemia and impaired fasting glucose.  Her energy is fine.  No lower extremity edema.  No symptoms of hyperglycemia.  Wt Readings  from Last 3 Encounters:  09/09/20 256 lb 6.4 oz (116.3 kg)  06/23/20 250 lb 9.6 oz (113.7 kg)  04/29/20 254 lb 9.6 oz (115.5 kg)    Assessment  1. DOE (dyspnea on exertion)   2. Hypoxia   3. ILD (interstitial lung disease) ([place])   4. Morbid obesity (H[facility])   5. Pernicious anemia   6. Impaired fasting glucose    7. Need for immunization against influenza      Plan   Dyspnea on exertion with hypoxia in setting of morbid obesity, interstitial lung disease and possible diastolic dysfunction: Discussed reason for cardiology evaluation.  Need to rule out ischemic heart disease.  Patient understands.  Continue oxygen with activity.  Continue monitoring.  Check for other causes that could be contributing including insulin resistance and hyperglycemia, worsening anemia, thyroid disease, heart failure.  Continue Lasix.  Lasix and potassium are reordered.  Flu shot updated today.  Follow up: As scheduled for complete physical.    10/12/2020  Orders Placed This Encounter  Procedures  . Flu Vaccine QUAD High Dose(Fluad)  . CBC with Differential/Platelet  . TSH  . B Nat Peptide  . Hemoglobin A1c  . COMPLETE METABOLIC PANEL WITH GFR  . Vitamin B12   Meds ordered this encounter  Medications  . potassium chloride (MICRO-K) 10 MEQ CR capsule    Sig: Take 1 capsule (10 mEq total) by mouth every other day.    Dispense:  45  capsule    Refill:  3  . furosemide (LASIX) 20 MG tablet    Sig: Take 1 tablet (20 mg total) by mouth daily as needed.    Dispense:  90 tablet    Refill:  3      I reviewed the patients updated PMH, FH, and SocHx.    Patient Active Problem List   Diagnosis Date Noted  . Sleep disorder 01/10/2020    Priority: High  . Pernicious anemia 01/10/2020    Priority: High  . Chronic prescription benzodiazepine use 01/10/2020    Priority: High  . Morbid obesity ([place]) 09/24/2019    Priority: High  . History of DVT (deep vein thrombosis), with PE 07/25/2019     Priority: High  . OSA on CPAP     Priority: High  . Crohn disease ([place]) 06/15/2017    Priority: High  . ILD (interstitial lung disease) ([employer]) 05/29/2017    Priority: High  . Dyslipidemia 09/24/2019    Priority: Medium  . History of polycythemia vera 11/12/2014    Priority: Medium  . Chronic allergic rhinitis 01/10/2020    Priority: Low  . Osteopenia 08/05/2020  . Adjustment disorder with anxiety 04/29/2020  . Benign neoplasm of rectum    Current Meds  Medication Sig  . aspirin EC 81 MG tablet Take 81 mg by mouth daily.  . B Complex-C (B-COMPLEX WITH VITAMIN C) tablet Take 1 tablet by mouth once a week.  [name] CANNABIDIOL PO Take 1 Dose by mouth 2 (two) times daily as needed (anxiety/sleep.). CBD Oil  . cetirizine (ZYRTEC) 10 MG tablet Take 10 mg by mouth daily as needed (sinus/allergies.).  [name] cetirizine-pseudoephedrine (ZYRTEC-D) 5-120 MG tablet Take 1 tablet by mouth daily as needed (sinus headaches.).  [name] Cholecalciferol (VITAMIN D3) 50 MCG (2000 UT) TABS Take 2,000 Units by mouth daily.  . clonazePAM (KLONOPIN) 0.5 MG tablet Take 0.5-1 tablets (0.25-0.5 mg total) by mouth daily as needed (for anxiety or sleep.).  [name] Cyanocobalamin (VITAMIN B-12 IJ) Inject 1,000 mcg as directed every 30 (thirty) days.  [name] FLUoxetine (PROZAC) 10 MG capsule Take 1 capsule (10 mg total) by mouth daily.  . fluticasone (FLONASE) 50 MCG/ACT nasal spray Place 1-2 sprays into both nostrils daily as needed for allergies or rhinitis.  . furosemide (LASIX) 20 MG tablet Take 1 tablet (20 mg total) by mouth daily as needed.  . Glycerin-Polysorbate 80 (REFRESH DRY EYE THERAPY OP) Place 1 drop into both eyes 3 (three) times daily as needed (dry/irritated eyes.).  [name] ibuprofen (ADVIL) 200 MG tablet Take 400-800 mg by mouth every 8 (eight) hours as needed (pain.).  [name] mesalamine (PENTASA) 250 MG CR capsule Take 250 mg by mouth 4 (four) times daily as needed (crohn's disease flare ups).   . prednisoLONE acetate (PRED FORTE)  1 % ophthalmic suspension Place 1 drop into both eyes 2 (two) times daily as needed (Uveitis).  . Probiotic Product (PROBIOTIC PO) Take 1 capsule by mouth 2 (two) times a week.   . [DISCONTINUED] furosemide (LASIX) 20 MG tablet Take 20 mg by mouth.  . [DISCONTINUED] potassium chloride (KLOR-CON) 10 MEQ tablet Take 10 mEq by mouth every other day.    Allergies: Patient is allergic to victoza [liraglutide], lasix [furosemide], metformin and related, onion, oxycodone, potassium-containing compounds, prednisone, and sulfa drugs cross reactors. Family History: Patient family history includes Anxiety disorder in her father; COPD in her brother; Diabetes Mellitus II in her brother and father; Glaucoma in her brother and mother;  Heart disease in her brother; High Cholesterol in her mother; High blood pressure in her mother; Non-Hodgkin's lymphoma in her brother; Sleep apnea in her father and mother. Social History:  Patient  reports that she quit smoking about 11 years ago. Her smoking use included cigarettes. She has a 45.00 pack-year smoking history. She has never used smokeless tobacco. She reports current alcohol use. She reports that she does not use drugs.  Review of Systems: Constitutional: Negative for fever malaise or anorexia Cardiovascular: negative for chest pain Respiratory: negative for SOB or persistent cough Gastrointestinal: negative for abdominal pain  Objective  Vitals: BP 132/84   Pulse 68   Temp (!) 97.5 F (36.4 C) (Temporal)   Wt 256 lb 6.4 oz (116.3 kg)   SpO2 98%   BMI 44.01 kg/m  General: no acute distress , A&Ox3      Commons side effects, risks, benefits, and alternatives for medications and treatment plan prescribed today were discussed, and the patient expressed understanding of the given instructions. Patient is instructed to call or message via MyChart if he/she has any questions or concerns regarding our treatment plan. No barriers to understanding were  identified. We discussed Red Flag symptoms and signs in detail. Patient expressed understanding regarding what to do in case of urgent or emergency type symptoms.   Medication list was reconciled, printed and provided to the patient in AVS. Patient instructions and summary information was reviewed with the patient as documented in the AVS. This note was prepared with assistance of Dragon voice recognition software. Occasional wrong-word or sound-a-like substitutions may have occurred due to the inherent limitations of voice recognition software  This visit occurred during the SARS-CoV-2 public health emergency.  Safety protocols were in place, including screening questions prior to the visit, additional usage of staff PPE, and extensive cleaning of exam room while observing appropriate contact time as indicated for disinfecting solutions.

## 2020-09-09 NOTE — Patient Instructions (Signed)
Please follow up as scheduled for your next visit with me: 11/02/2020   I will release your lab results to you on your MyChart account with further instructions. Please reply with any questions.   If you have any questions or concerns, please don't hesitate to send me a message via MyChart or call the office at [phone]. Thank you for visiting with [name] today! It's our pleasure caring for you.

## 2020-09-10 LAB — COMPLETE METABOLIC PANEL WITH GFR
AG Ratio: 1.5 (calc) (ref 1.0–2.5)
ALT: 17 U/L (ref 6–29)
AST: 18 U/L (ref 10–35)
Albumin: 4.1 g/dL (ref 3.6–5.1)
Alkaline phosphatase (APISO): 72 U/L (ref 37–153)
BUN: 21 mg/dL (ref 7–25)
CO2: 29 mmol/L (ref 20–32)
Calcium: 9.7 mg/dL (ref 8.6–10.4)
Chloride: 103 mmol/L (ref 98–110)
Creat: 0.81 mg/dL (ref 0.60–0.93)
GFR, Est African American: 85 mL/min/{1.73_m2} (ref >=60)
GFR, Est Non African American: 74 mL/min/{1.73_m2} (ref >=60)
Globulin: 2.7 g/dL (calc) (ref 1.9–3.7)
Glucose, Bld: 90 mg/dL (ref 65–99)
Potassium: 4.5 mmol/L (ref 3.5–5.3)
Sodium: 140 mmol/L (ref 135–146)
Total Bilirubin: 0.4 mg/dL (ref 0.2–1.2)
Total Protein: 6.8 g/dL (ref 6.1–8.1)

## 2020-09-10 LAB — HEMOGLOBIN A1C
Hgb A1c MFr Bld: 6.3 % of total Hgb — ABNORMAL HIGH (ref <5.7)
Mean Plasma Glucose: 134 (calc)
eAG (mmol/L): 7.4 (calc)

## 2020-09-10 LAB — CBC WITH DIFFERENTIAL/PLATELET
Absolute Monocytes: 755 cells/uL (ref 200–950)
Basophils Absolute: 39 cells/uL (ref 0–200)
Basophils Relative: 0.5 %
Eosinophils Absolute: 162 cells/uL (ref 15–500)
Eosinophils Relative: 2.1 %
HCT: 45.1 % — ABNORMAL HIGH (ref 35.0–45.0)
Hemoglobin: 14.4 g/dL (ref 11.7–15.5)
Lymphs Abs: 1948 cells/uL (ref 850–3900)
MCH: 29.1 pg (ref 27.0–33.0)
MCHC: 31.9 g/dL — ABNORMAL LOW (ref 32.0–36.0)
MCV: 91.1 fL (ref 80.0–100.0)
MPV: 10.6 fL (ref 7.5–12.5)
Monocytes Relative: 9.8 %
Neutro Abs: 4797 cells/uL (ref 1500–7800)
Neutrophils Relative %: 62.3 %
Platelets: 312 10*3/uL (ref 140–400)
RBC: 4.95 10*6/uL (ref 3.80–5.10)
RDW: 14 % (ref 11.0–15.0)
Total Lymphocyte: 25.3 %
WBC: 7.7 10*3/uL (ref 3.8–10.8)

## 2020-09-10 LAB — BRAIN NATRIURETIC PEPTIDE: Brain Natriuretic Peptide: 18 pg/mL (ref <100)

## 2020-09-10 LAB — TSH: TSH: 0.58 mIU/L (ref 0.40–4.50)

## 2020-09-10 LAB — VITAMIN B12: Vitamin B-12: 1051 pg/mL (ref 200–1100)

## 2020-10-06 ENCOUNTER — Other Ambulatory Visit: Payer: Self-pay | Admitting: Family Medicine

## 2020-10-06 ENCOUNTER — Encounter: Payer: Self-pay | Admitting: Family Medicine

## 2020-10-06 NOTE — Telephone Encounter (Signed)
Last refill: 01/10/20 #90, 1 Last OV: 09/09/20 dx. DOE

## 2020-10-07 NOTE — Progress Notes (Signed)
Cardiology Office Note  Date:  10/07/2020   ID:  Patty [name], DOB 05-Jul-1951, MRN [id number]  PCP:  Patty Arnt, MD  Cardiologist:  Dr. Johnsie Cancel  _____________  2 year follow-up/ worsening dypsnea  _____________   History of Present Illness: Patty [name] is a 69 y.o. female with pmh of CAD seen on CT with hx of dyspnea, nuc study in 06/2017 with EF 53% and + ishcemia, cath in 2018 with no evidence of CAD, echo with EF 65-70% and G1DD, ILD on O2 with activity, morbid obesity, pernicious anemia, OSA on CPAP, remote smoker, Crohn's disease, and pre-diabetes who is being seen for worsening dyspnea.  Last seen 10/2017 and was symptomatically doing well. She was placed no laisix for elevated LVEDP however had negative reaction to it (pruritis). NO chest pain.   Patient reports for the last couple months she has had worsening sob. She received the vaccine in February of this year and the second in March. Since then she has noticed worsening dyspnea on exertion. She was walking 5-6 days for 30 minutes and she noted O2 was dropping and she was was requiring more O2 during exercise. Also noted she was unable to walk as far as before. Denies chest pain. She replaced her O2 concentrator but this did not seem to help. She saw her pulmonologist who did a full work up and said breathing was worse however it was not due to worsening ILD. He also tested her OSA and put her on 2L at night for CPAP. Pt says this has helped her sleeping but not necessarily her breathing.Pulmonology recommended ischemic eval to rule out CAD.  An echo was ordered 07/17/20 which showed LVEF 60-65% with no WMA, G1DD. Patient takes lasix every other day along with potassium. This controls volume status well. Pt follows low salt idet,  6 months ago PCP wanted to start a statin however patient wanted to try lifestyle changes first. She has pre-diabetes. Seh says she still has dyspnea on exertion however some days are worse  than others. She denies symptoms of palpitations, chest pain, shortness of breath, orthopnea, PND, lower extremity edema, claudication, dizziness, presyncope, syncope, bleeding, or neurologic sequela.     Past Medical History:  Diagnosis Date  . Abnormal nuclear stress test    a. 07/2017: cath with no CAD  . Amyloidosis ([place])   . Crohn disease ([place])   . Crohn's colitis ([place])   . DVT (deep venous thrombosis) ([place])   . Grade I diastolic dysfunction   . Hyperlipidemia   . ILD (interstitial lung disease) ([place])   . OSA on CPAP   . Pernicious anemia   . Polycythemia vera ([place])   . Pulmonary embolism ([place]) 2010   s/p hip surgery , reports this was never truly confirmed   . Sleep disorder 01/10/2020   Uses klonopin for sleep since 2014; sleep walking. And anxiety   Past Surgical History:  Procedure Laterality Date  . BIOPSY  09/30/2019   Procedure: BIOPSY;  Surgeon: Patty Bears, MD;  Location: [facility];  Service: Gastroenterology;;  . [place]  . COLONOSCOPY WITH PROPOFOL N/A 09/30/2019   Procedure: COLONOSCOPY WITH PROPOFOL;  Surgeon: Patty Bears, MD;  Location: [facility];  Service: Gastroenterology;  Laterality: N/A;  . HIP ARTHROPLASTY Right    x 2, initial right THA, then subsequent right THA revision   . LEFT HEART CATH AND CORONARY ANGIOGRAPHY N/A 07/28/2017   Procedure: Left Heart Cath  and Coronary Angiography;  Surgeon: Patty Blanks, MD;  Location: [facility];  Service: Cardiovascular;  Laterality: N/A;  . POLYPECTOMY  09/30/2019   Procedure: POLYPECTOMY;  Surgeon: Patty Bears, MD;  Location: [facility];  Service: Gastroenterology;;   _____________  Current Outpatient Medications  Medication Sig Dispense Refill  . aspirin EC 81 MG tablet Take 81 mg by mouth daily.    . B Complex-C (B-COMPLEX WITH VITAMIN C) tablet Take 1 tablet by mouth once a week.    [name] CANNABIDIOL PO Take 1 Dose by mouth 2 (two) times daily as needed  (anxiety/sleep.). CBD Oil    . cetirizine (ZYRTEC) 10 MG tablet Take 10 mg by mouth daily as needed (sinus/allergies.).    [name] cetirizine-pseudoephedrine (ZYRTEC-D) 5-120 MG tablet Take 1 tablet by mouth daily as needed (sinus headaches.).    [name] Cholecalciferol (VITAMIN D3) 50 MCG (2000 UT) TABS Take 2,000 Units by mouth daily.    . clonazePAM (KLONOPIN) 0.5 MG tablet TAKE 0.5-1 TABLETS (0.25-0.5 MG TOTAL) BY MOUTH DAILY AS NEEDED (FOR ANXIETY OR SLEEP.). 90 tablet 1  . Cyanocobalamin (VITAMIN B-12 IJ) Inject 1,000 mcg as directed every 30 (thirty) days.    [name] FLUoxetine (PROZAC) 10 MG capsule Take 1 capsule (10 mg total) by mouth daily. 90 capsule 3  . fluticasone (FLONASE) 50 MCG/ACT nasal spray Place 1-2 sprays into both nostrils daily as needed for allergies or rhinitis.    . furosemide (LASIX) 20 MG tablet Take 1 tablet (20 mg total) by mouth daily as needed. 90 tablet 3  . Glycerin-Polysorbate 80 (REFRESH DRY EYE THERAPY OP) Place 1 drop into both eyes 3 (three) times daily as needed (dry/irritated eyes.).    [name] ibuprofen (ADVIL) 200 MG tablet Take 400-800 mg by mouth every 8 (eight) hours as needed (pain.).    [name] mesalamine (PENTASA) 250 MG CR capsule Take 250 mg by mouth 4 (four) times daily as needed (crohn's disease flare ups).     . potassium chloride (MICRO-K) 10 MEQ CR capsule Take 1 capsule (10 mEq total) by mouth every other day. 45 capsule 3  . prednisoLONE acetate (PRED FORTE) 1 % ophthalmic suspension Place 1 drop into both eyes 2 (two) times daily as needed (Uveitis).    . Probiotic Product (PROBIOTIC PO) Take 1 capsule by mouth 2 (two) times a week.      No current facility-administered medications for this visit.   _____________   Allergies:   Victoza [liraglutide], Lasix [furosemide], Metformin and related, Onion, Oxycodone, Potassium-containing compounds, Prednisone, and Sulfa drugs cross reactors  _____________   Social History:  The patient  reports that she quit smoking  about 11 years ago. Her smoking use included cigarettes. She has a 45.00 pack-year smoking history. She has never used smokeless tobacco. She reports current alcohol use. She reports that she does not use drugs.  _____________   Family History:  The patient's family history includes Anxiety disorder in her father; COPD in her brother; Diabetes Mellitus II in her brother and father; Glaucoma in her brother and mother; Heart disease in her brother; High Cholesterol in her mother; High blood pressure in her mother; Non-Hodgkin's lymphoma in her brother; Sleep apnea in her father and mother.  _____________   ROS:  Please see the history of present illness.   Positive for dyspnea,   All other systems are reviewed and negative.  _____________   PHYSICAL EXAM: VS:  There were no vitals taken for this visit. ,  BMI There is no height or weight on file to calculate BMI. GEN: Well nourished, well developed, in no acute distress  HEENT: normal  Neck: no JVD, carotid bruits, or masses Cardiac: RRR; no murmurs, rubs, or gallops. No clubbing, cyanosis, edema.  Radials/DP/PT 2+ and equal bilaterally.  Respiratory:  clear to auscultation bilaterally, normal work of breathing GI: soft, nontender, nondistended, + BS MS: no deformity or atrophy  Skin: warm and dry, no rash Neuro:  Strength and sensation are intact Psych: euthymic mood, full affect _____________  EKG:   The ekg ordered today shows NSR 73bpm, nonspecific ST/T wave changes  Recent Labs: 06/23/2020: Pro B Natriuretic peptide (BNP) 34.0 09/09/2020: ALT 17; Brain Natriuretic Peptide 18; BUN 21; Creat 0.81; Hemoglobin 14.4; Platelets 312; Potassium 4.5; Sodium 140; TSH 0.58  04/29/2020: Cholesterol 213; HDL 56.30; LDL Cholesterol 128; Total CHOL/HDL Ratio 4; Triglycerides 143.0; VLDL 28.6  CrCl cannot be calculated (Patient's most recent lab result is older than the maximum 21 days allowed.).  Wt Readings from Last 3 Encounters:  09/09/20 256 lb  6.4 oz (116.3 kg)  06/23/20 250 lb 9.6 oz (113.7 kg)  04/29/20 254 lb 9.6 oz (115.5 kg)     Echo 07/17/20 1. Left ventricular ejection fraction, by estimation, is 60 to 65%. The  left ventricle has normal function. The left ventricle has no regional  wall motion abnormalities. There is moderate concentric left ventricular  hypertrophy. Left ventricular  diastolic parameters are consistent with Grade I diastolic dysfunction  (impaired relaxation).  2. Although TAPSE is normal, visually RVF appears reduced. The right  ventricular size is normal.  3. The mitral valve is normal in structure. No evidence of mitral valve  regurgitation. No evidence of mitral stenosis.  4. The aortic valve is normal in structure. Aortic valve regurgitation is  not visualized. No aortic stenosis is present.  5. The inferior vena cava is normal in size with greater than 50%  respiratory variability, suggesting right atrial pressure of 3 mmHg.   Left heart cath 07/2017 1. No angiographic evidence of CAD 2. Elevated filling pressures  Recommendations: No further ischemic workup. Her filling pressures are elevated. She may benefit from diuretic therapy if she becomes symptomatic.    _____________   ASSESSMENT AND PLAN:  Dyspnea - Pt has ILD with O2 use during activity, morbid obesity, and OSa on CPAP - Reports new dyspnea since the covid vaccine in February requiring increased O2. DOE on exertion worse than before. No chest pain.  - Pulmonology did full work up and said breathing was worse however not due to ILD. Also CPAP tested and she was put on 2L at night. -Pulmonology wanted to r/u CAD - Pt is still has DOE however some days are better than others. She is trying to lose weight.  - Cath in 07/2017 showing nonobstructive CAD.  - Recent echo showed LVEF 60-65% with G1DD - Patient denies chest pain. EKG with no new ishcemic changes.  - PCP wanting to start statin however patient wanted to try  lifestyle changes. They will re-check lipids - Pt also has pre-diabetes with A1C 6.3%  - BP good today - She takes daily aspirin - Will order Myoview stress test   Chronic diastolic CHF - Echo 07/8279 showed LVEF 60-65% with G1DD - takes lasix every other day with potassium. Cannot take every day due to side effects.  - Euvolemic on exam - continue current regimen  Hyperlipidemia - lipid profile 04/2020: total 213, HDL 56,  LDL 128, TG 143 - patient has made diet/lifestyle changes - PCP plans to re-check and consider statin at that point  Pre-diabetes - last A1C 6.3 in 08/2020 - Diet and lifestyle changes as above, followed by PCP  ILD - Uses O2 with exercise - followed by pulmonary  OSA - on CPAP with 2L at night followed by Pulmonology _____________    Disposition:   FU with Dr. Johnsie Cancel in 1 year or sooner depending on stress test results   Signed, Danaiya Steadman Ninfa Meeker, PA-C 10/07/2020 9:32 PM    _____________ [facility] [address] [place] [address]  [phone] (office) [phone] (fax)

## 2020-10-08 ENCOUNTER — Other Ambulatory Visit: Payer: Self-pay

## 2020-10-08 ENCOUNTER — Ambulatory Visit (INDEPENDENT_AMBULATORY_CARE_PROVIDER_SITE_OTHER): Payer: Medicare Other | Admitting: Medical

## 2020-10-08 ENCOUNTER — Encounter: Payer: Self-pay | Admitting: Medical

## 2020-10-08 VITALS — BP 118/72 | HR 73 | Ht 64.0 in | Wt 253.8 lb

## 2020-10-08 DIAGNOSIS — R7303 Prediabetes: Secondary | ICD-10-CM

## 2020-10-08 DIAGNOSIS — R0600 Dyspnea, unspecified: Secondary | ICD-10-CM

## 2020-10-08 DIAGNOSIS — R0602 Shortness of breath: Secondary | ICD-10-CM | POA: Diagnosis not present

## 2020-10-08 DIAGNOSIS — G4733 Obstructive sleep apnea (adult) (pediatric): Secondary | ICD-10-CM

## 2020-10-08 DIAGNOSIS — Z9989 Dependence on other enabling machines and devices: Secondary | ICD-10-CM | POA: Diagnosis not present

## 2020-10-08 DIAGNOSIS — R0609 Other forms of dyspnea: Secondary | ICD-10-CM

## 2020-10-08 DIAGNOSIS — E785 Hyperlipidemia, unspecified: Secondary | ICD-10-CM | POA: Diagnosis not present

## 2020-10-08 DIAGNOSIS — I5032 Chronic diastolic (congestive) heart failure: Secondary | ICD-10-CM | POA: Diagnosis not present

## 2020-10-08 DIAGNOSIS — J849 Interstitial pulmonary disease, unspecified: Secondary | ICD-10-CM

## 2020-10-08 NOTE — Patient Instructions (Signed)
Medication Instructions:  Your physician recommends that you continue on your current medications as directed. Please refer to the Current Medication list given to you today.  *If you need a refill on your cardiac medications before your next appointment, please call your pharmacy*   Lab Work: None ordered  If you have labs (blood work) drawn today and your tests are completely normal, you will receive your results only by:  [place] (if you have MyChart) OR  A paper copy in the mail If you have any lab test that is abnormal or we need to change your treatment, we will call you to review the results.   Testing/Procedures: Your physician has requested that you have a lexiscan myoview. For further information please visit [email or web address]. Please follow instruction sheet, as BELOW:     You are scheduled for a Myocardial Perfusion Imaging Study.  Please arrive 15 minutes prior to your appointment time for registration and insurance purposes.  The test will take approximately 3 to 4 hours to complete; you may bring reading material.  If someone comes with you to your appointment, they will need to remain in the main lobby due to limited space in the testing area. **If you are pregnant or breastfeeding, please notify the nuclear lab prior to your appointment**  How to prepare for your Myocardial Perfusion Test:  Do not eat or drink 3 hours prior to your test, except you may have water.  Do not consume products containing caffeine (regular or decaffeinated) 12 hours prior to your test. (ex: coffee, chocolate, sodas, tea).  Do bring a list of your current medications with you.  If not listed below, you may take your medications as normal.  Do wear comfortable clothes (no dresses or overalls) and walking shoes, tennis shoes preferred (No heels or open toe shoes are allowed).  Do NOT wear cologne, perfume, aftershave, or lotions (deodorant is allowed).  If these instructions are  not followed, your test will have to be rescheduled.      Follow-Up: At [facility], you and your health needs are our priority.  As part of our continuing mission to provide you with exceptional heart care, we have created designated Provider Care Teams.  These Care Teams include your primary Cardiologist (physician) and Advanced Practice Providers (APPs -  Physician Assistants and Nurse Practitioners) who all work together to provide you with the care you need, when you need it.  We recommend signing up for the patient portal called "MyChart".  Sign up information is provided on this After Visit Summary.  MyChart is used to connect with patients for Virtual Visits (Telemedicine).  Patients are able to view lab/test results, encounter notes, upcoming appointments, etc.  Non-urgent messages can be sent to your provider as well.   To learn more about what you can do with MyChart, go to [email or web address].    Your next appointment:   12 month(s)  The format for your next appointment:   In Person  Provider:   You may see Jenkins Rouge, MD or one of the following Advanced Practice Providers on your designated Care Team:    Truitt Merle, NP  Cecilie Kicks, NP  Kathyrn Drown, NP    Other Instructions

## 2020-10-12 ENCOUNTER — Ambulatory Visit: Payer: Medicare Other

## 2020-10-15 ENCOUNTER — Telehealth (HOSPITAL_COMMUNITY): Payer: Self-pay | Admitting: *Deleted

## 2020-10-15 NOTE — Telephone Encounter (Signed)
Left message on voicemail per DPR in reference to upcoming appointment scheduled on 10/21/20 at 1:15 with detailed instructions given per Myocardial Perfusion Study Information Sheet for the test. LM to arrive 15 minutes early, and that it is imperative to arrive on time for appointment to keep from having the test rescheduled. If you need to cancel or reschedule your appointment, please call the office within 24 hours of your appointment. Failure to do so may result in a cancellation of your appointment, and a $50 no show fee. Phone number given for call back for any questions.

## 2020-10-21 ENCOUNTER — Other Ambulatory Visit: Payer: Self-pay

## 2020-10-21 ENCOUNTER — Ambulatory Visit (HOSPITAL_COMMUNITY): Payer: Medicare Other | Attending: Cardiovascular Disease

## 2020-10-21 DIAGNOSIS — R0602 Shortness of breath: Secondary | ICD-10-CM | POA: Insufficient documentation

## 2020-10-21 MED ORDER — TECHNETIUM TC 99M TETROFOSMIN IV KIT
31.4000 | PACK | Freq: Once | INTRAVENOUS | Status: AC | PRN
  Administered 2020-10-21: 31.4 via INTRAVENOUS
  Filled 2020-10-21: qty 32

## 2020-10-21 MED ORDER — REGADENOSON 0.4 MG/5ML IV SOLN
0.4000 mg | Freq: Once | INTRAVENOUS | Status: AC
  Administered 2020-10-21: 0.4 mg via INTRAVENOUS

## 2020-10-22 ENCOUNTER — Ambulatory Visit (HOSPITAL_COMMUNITY): Payer: Medicare Other | Attending: Cardiology

## 2020-10-22 LAB — MYOCARDIAL PERFUSION IMAGING
LV dias vol: 120 mL (ref 46–106)
LV sys vol: 26 mL
Peak HR: 96 {beats}/min
Rest HR: 60 {beats}/min
SDS: 1
SRS: 3
SSS: 4
TID: 0.98

## 2020-10-22 MED ORDER — TECHNETIUM TC 99M TETROFOSMIN IV KIT
31.5000 | PACK | Freq: Once | INTRAVENOUS | Status: AC | PRN
  Administered 2020-10-22: 31.5 via INTRAVENOUS
  Filled 2020-10-22: qty 32

## 2020-10-23 ENCOUNTER — Telehealth: Payer: Self-pay | Admitting: Cardiovascular Disease

## 2020-10-23 NOTE — Telephone Encounter (Signed)
Advised patient that her stress test results have not been reviewed but we will call her with results once they are.

## 2020-10-23 NOTE — Telephone Encounter (Signed)
Patient calling for stress test results.

## 2020-10-27 ENCOUNTER — Other Ambulatory Visit: Payer: Self-pay | Admitting: Surgery

## 2020-10-27 DIAGNOSIS — N6021 Fibroadenosis of right breast: Secondary | ICD-10-CM

## 2020-10-28 ENCOUNTER — Telehealth: Payer: Self-pay | Admitting: Cardiovascular Disease

## 2020-10-28 ENCOUNTER — Telehealth: Payer: Self-pay

## 2020-10-28 NOTE — Telephone Encounter (Signed)
Follow Up:     Pt is returning Katie's call, concerning test results.

## 2020-10-28 NOTE — Telephone Encounter (Signed)
° ° °  Pt is returning call

## 2020-10-28 NOTE — Telephone Encounter (Signed)
Dr Chase Caller, This message was received this afternoon.   Hi Dr. Chase Caller, The results from the cardiac stress test came back normal and low risk and the pulmonary function test, MRI, and echo all show the IST unchanged. I have not had any improvement since our visit The only connection I have correlated is it started after my 2nd COVID Vaccine. I have continued my walking of 50mnutes or more 5-7 days/week but my distance has shortened due to stopping to regain normal oxygen/heart levels. . Are there any other causes for the shortness of breath to worsen?  Is there any treatment that would help? Is it is COVID Vaccine that caused this, would it be permanent? Should I have the booster shot if this issue is a reaction to the vaccine? Thank you, SBonna Gains

## 2020-10-28 NOTE — Telephone Encounter (Signed)
Left message to call office

## 2020-10-28 NOTE — Telephone Encounter (Signed)
The patient has been notified of the result and verbalized understanding.  All questions (if any) were answered. Wilma Flavin, RN 10/28/2020 2:08 PM

## 2020-10-28 NOTE — Telephone Encounter (Signed)
     I went in pt's chart to see who called her. Call was transferred to [facility]

## 2020-10-29 NOTE — Telephone Encounter (Signed)
The ILD itself is stable.  The cardiac stress test is stable.  I do not know why she is feeling more short of breath.  One of the possibilities elevated pulmonary artery pressures.  For this she will have to call the cardiologist back and see if they want to do a right heart catheterization.  Also please schedule a follow-up so we can discuss more of this face-to-face next few to several weeks is fine

## 2020-11-02 ENCOUNTER — Ambulatory Visit: Payer: Medicare Other | Admitting: Family Medicine

## 2020-11-03 ENCOUNTER — Encounter: Payer: Self-pay | Admitting: Family Medicine

## 2020-11-06 ENCOUNTER — Telehealth: Payer: Self-pay | Admitting: Medical

## 2020-11-06 NOTE — Telephone Encounter (Signed)
Left detailed message, dpr on file. Informed that mychart questions were forwarded to ordering provider to address on 11/9. Aware that we will forward this telephone note as well, aware she should get a response next wee.

## 2020-11-06 NOTE — Telephone Encounter (Signed)
Patient sent the office a MyChart message on Tuesday but has not gotten a response yet . The patient said the best way to communicate with her is through the MyChart portal.

## 2020-11-12 NOTE — Telephone Encounter (Signed)
Patient calling back in stating she has not received a phone call/message from anyone regarding her MyChart message. Patient states she is fine with someone other than Cadence returning her call to answer the questions, however she prefers communication via [employer]. The patient stated that she is unhappy with the timeframe it has taken to receive a response.

## 2020-11-12 NOTE — Telephone Encounter (Signed)
Sent patient's original mychart message to Dr. Johnsie Cancel, so she can get a response.

## 2020-11-12 NOTE — Telephone Encounter (Signed)
Will forward to Dr. Johnsie Cancel to see if he can advise patient.

## 2020-11-13 ENCOUNTER — Telehealth: Payer: Self-pay | Admitting: Internal Medicine

## 2020-11-13 DIAGNOSIS — J849 Interstitial pulmonary disease, unspecified: Secondary | ICD-10-CM

## 2020-11-13 NOTE — Telephone Encounter (Signed)
Attempted to call pt but unable to reach. Left message for her to return call. 

## 2020-11-13 NOTE — Telephone Encounter (Signed)
Patient returning Pam's phone call, patient will await a callback

## 2020-11-13 NOTE — Telephone Encounter (Signed)
Made patient an appointment on 11/25/20 to see Dr. Johnsie Cancel to discuss [place].

## 2020-11-13 NOTE — Telephone Encounter (Signed)
Go ahead as indicated before and make arrangements for right heart cath with DM, DB

## 2020-11-13 NOTE — Telephone Encounter (Signed)
Hi Patty [name]  I last saw patient in June 2021 / AFter that my APP saw in aug 2021. Then she saw [name]. Looks like myoview normal. So, yes at this stage she needs RHC. I wil get her in soon but probably best she can get RHC first. If she has WHO group 3 pAH then inhaled tyvaso can help  Thanks  MR

## 2020-11-13 NOTE — Telephone Encounter (Signed)
Left message for patient to call back.

## 2020-11-13 NOTE — Telephone Encounter (Signed)
Patty [name]  Patient is concerned about her DOE and desats. She had contactd cardiology and they reached out to me  Plan  - pls let her know that I have indicated to cardiology the need for right heart cath  - pls give her first aavail spiro/dlco and to see me any day in 30 min slot  Thanks  MR  PFT Results Latest Ref Rng & Units 06/09/2020 10/01/2019 03/12/2019 08/28/2018 02/05/2018 05/17/2017  FVC-Pre L 2.42 2.38 2.73 2.80 2.57 2.61  FVC-Predicted Pre % 82 81 91 94 83 84  FVC-Post L - - - - - 2.54  FVC-Predicted Post % - - - - - 81  Pre FEV1/FVC % % 63 69 67 70 67 70  Post FEV1/FCV % % - - - - - 73  FEV1-Pre L 1.53 1.65 1.83 1.96 1.71 1.83  FEV1-Predicted Pre % 68 74 80 86 72 77  FEV1-Post L - - - - - 1.84  DLCO uncorrected ml/min/mmHg 13.48 14.39 13.77 14.53 15.15 12.64  DLCO UNC% % 70 75 72 62 62 52  DLCO corrected ml/min/mmHg 12.89 - - - - 12.31  DLCO COR %Predicted % 67 - - - - 50  DLVA Predicted % 70 75 72 65 65 60  TLC L - - - - - 5.09  TLC % Predicted % - - - - - 100  RV % Predicted % - - - - - 115

## 2020-11-15 NOTE — H&P (View-Only) (Signed)
Cardiology Office Note  Date:  11/25/2020   ID:  Patty [name], DOB 07/26/1951, MRN [id number]  PCP:  [name], MD  Cardiologist:  Dr. Johnsie Cancel  _____________  _____________   History of Present Illness:  69 y.o. I have not seen in over 3 years. Seen by PA October of this year. Follows with Dr Chase Caller for ILD and chronic lung disease Has had increasing dyspnea over the last few months. Echo done 07/17/20 EF 60-65% only grade one diastolic parameters no significant valve disease or suggestion of severe pulmonary HTN.  Myovue done 10/22/20 was normal with no ischemia Cath in 2018 with no CAD. Referred by pulmonary for right heart cath He felt that if she had WHO 2 pulmonary HTN that inhaled Tyvaso may help CT chest 07/09/20 showed ILD but deemed stable compared to 2018   She notes worsening desats with activity Tries to walk up to a mile daily HR higher No chest pain  She was started on meds for DM but they made her gain weight and then and endocrinologist told Her she did not have DM. She [facility]   Long discussion about the risks and benefits of right heart catheterization to guide Rx for presumed Pulmonary HTN Would seem to be WHO class 3 given her known lung dx ILD   Past Medical History:  Diagnosis Date  . Abnormal nuclear stress test    a. 07/2017: cath with no CAD  . Amyloidosis ([place])   . Crohn disease ([place])   . Crohn's colitis ([place])   . DVT (deep venous thrombosis) ([place])   . Grade I diastolic dysfunction   . Hyperlipidemia   . ILD (interstitial lung disease) ([place])   . OSA on CPAP   . Pernicious anemia   . Polycythemia vera ([place])   . Pulmonary embolism ([place]) 2010   s/p hip surgery , reports this was never truly confirmed   . Sleep disorder 01/10/2020   Uses klonopin for sleep since 2014; sleep walking. And anxiety   Past Surgical History:  Procedure Laterality Date  . BIOPSY  09/30/2019   Procedure: BIOPSY;   Surgeon: Jerene Bears, MD;  Location: [facility];  Service: Gastroenterology;;  . [place]  . COLONOSCOPY WITH PROPOFOL N/A 09/30/2019   Procedure: COLONOSCOPY WITH PROPOFOL;  Surgeon: Jerene Bears, MD;  Location: [facility];  Service: Gastroenterology;  Laterality: N/A;  . HIP ARTHROPLASTY Right    x 2, initial right THA, then subsequent right THA revision   . LEFT HEART CATH AND CORONARY ANGIOGRAPHY N/A 07/28/2017   Procedure: Left Heart Cath and Coronary Angiography;  Surgeon: Burnell Blanks, MD;  Location: [facility];  Service: Cardiovascular;  Laterality: N/A;  . POLYPECTOMY  09/30/2019   Procedure: POLYPECTOMY;  Surgeon: Jerene Bears, MD;  Location: [facility];  Service: Gastroenterology;;   _____________  Current Outpatient Medications  Medication Sig Dispense Refill  . amoxicillin (AMOXIL) 500 MG capsule Take 500 mg by mouth 3 (three) times daily.    [name] aspirin EC 81 MG tablet Take 81 mg by mouth daily.    . Calcium Carbonate-Vit D-Min (CALCIUM 1200) 1200-1000 MG-UNIT CHEW     . CANNABIDIOL PO Take 1 Dose by mouth 2 (two) times daily as needed (anxiety/sleep.). CBD Oil    . cetirizine (ZYRTEC) 10 MG tablet Take 10 mg by mouth daily as needed (sinus/allergies.).    [name] cetirizine-pseudoephedrine (ZYRTEC-D)  5-120 MG tablet Take 1 tablet by mouth daily as needed (sinus headaches.).    [name] Cholecalciferol (VITAMIN D3) 50 MCG (2000 UT) TABS Take 2,000 Units by mouth daily.    . clonazePAM (KLONOPIN) 0.5 MG tablet TAKE 0.5-1 TABLETS (0.25-0.5 MG TOTAL) BY MOUTH DAILY AS NEEDED (FOR ANXIETY OR SLEEP.). 90 tablet 1  . Cyanocobalamin (VITAMIN B-12 IJ) Inject 1,000 mcg as directed every 30 (thirty) days.    [name] FLUoxetine (PROZAC) 10 MG capsule Take 1 capsule (10 mg total) by mouth daily. 90 capsule 3  . fluticasone (FLONASE) 50 MCG/ACT nasal spray Place 1-2 sprays into both nostrils daily as needed for allergies or rhinitis.    . Glycerin-Polysorbate 80 (REFRESH  DRY EYE THERAPY OP) Place 1 drop into both eyes 3 (three) times daily as needed (dry/irritated eyes.).    [name] ibuprofen (ADVIL) 200 MG tablet Take 400-800 mg by mouth every 8 (eight) hours as needed (pain.).    [name] mesalamine (PENTASA) 250 MG CR capsule Take 250 mg by mouth 4 (four) times daily as needed (crohn's disease flare ups).     . prednisoLONE acetate (PRED FORTE) 1 % ophthalmic suspension Place 1 drop into both eyes 2 (two) times daily as needed (Uveitis).    . Probiotic Product (PROBIOTIC PO) Take 1 capsule by mouth 2 (two) times a week.     . furosemide (LASIX) 20 MG tablet Take 20 mg by mouth every other day. (Patient not taking: Reported on 11/25/2020)    . potassium chloride (MICRO-K) 10 MEQ CR capsule Take 1 capsule (10 mEq total) by mouth every other day. (Patient not taking: Reported on 11/25/2020) 45 capsule 3   No current facility-administered medications for this visit.   _____________   Allergies:   Victoza [liraglutide], Lasix [furosemide], Metformin and related, Onion, Oxycodone, Potassium-containing compounds, Prednisone, and Sulfa drugs cross reactors  _____________   Social History:  The patient  reports that she quit smoking about 11 years ago. Her smoking use included cigarettes. She has a 45.00 pack-year smoking history. She has never used smokeless tobacco. She reports current alcohol use. She reports that she does not use drugs.  _____________   Family History:  The patient's family history includes Anxiety disorder in her father; COPD in her brother; Diabetes Mellitus II in her brother and father; Glaucoma in her brother and mother; Heart disease in her brother; High Cholesterol in her mother; High blood pressure in her mother; Non-Hodgkin's lymphoma in her brother; Sleep apnea in her father and mother.  _____________   ROS:  Please see the history of present illness.   Positive for dyspnea,   All other systems are reviewed and negative.  _____________   PHYSICAL  EXAM: VS:  BP 132/84   Pulse 75   Ht _0  (1.626 m)   Wt 118.4 kg   SpO2 97%   BMI 44.80 kg/m  , BMI Body mass index is 44.8 kg/m.  Affect appropriate Healthy:  appears stated age 38: normal Neck supple with no adenopathy JVP normal no bruits no thyromegaly Lungs velcro crackles in lungs  Heart:  S1/S2 no murmur, no rub, gallop or click PMI normal Abdomen: benighn, BS positve, no tenderness, no AAA no bruit.  No HSM or HJR Distal pulses intact with no bruits No edema Neuro non-focal Skin warm and dry No muscular weakness  _____________  EKG:   NSR 73bpm, nonspecific ST/T wave changes  11/25/2020 SR PAC ICRBBB   Recent Labs: 06/23/2020: Pro B  Natriuretic peptide (BNP) 34.0 09/09/2020: ALT 17; Brain Natriuretic Peptide 18; BUN 21; Creat 0.81; Hemoglobin 14.4; Platelets 312; Potassium 4.5; Sodium 140; TSH 0.58  04/29/2020: Cholesterol 213; HDL 56.30; LDL Cholesterol 128; Total CHOL/HDL Ratio 4; Triglycerides 143.0; VLDL 28.6  CrCl cannot be calculated (Patient's most recent lab result is older than the maximum 21 days allowed.).  Wt Readings from Last 3 Encounters:  11/25/20 118.4 kg  10/21/20 114.8 kg  10/08/20 115.1 kg     Echo 07/17/20 1. Left ventricular ejection fraction, by estimation, is 60 to 65%. The  left ventricle has normal function. The left ventricle has no regional  wall motion abnormalities. There is moderate concentric left ventricular  hypertrophy. Left ventricular  diastolic parameters are consistent with Grade I diastolic dysfunction  (impaired relaxation).  2. Although TAPSE is normal, visually RVF appears reduced. The right  ventricular size is normal.  3. The mitral valve is normal in structure. No evidence of mitral valve  regurgitation. No evidence of mitral stenosis.  4. The aortic valve is normal in structure. Aortic valve regurgitation is  not visualized. No aortic stenosis is present.  5. The inferior vena cava is normal in size with  greater than 50%  respiratory variability, suggesting right atrial pressure of 3 mmHg.   Left heart cath 07/2017 1. No angiographic evidence of CAD 2. Elevated filling pressures  Recommendations: No further ischemic workup. Her filling pressures are elevated. She may benefit from diuretic therapy if she becomes symptomatic.    _____________   ASSESSMENT AND PLAN:  Dyspnea - No obvious cardiac etiology with normal LVEF, non ischemic myovue ILD presumably stable by CT but progressive symptoms Per pulmonary discussed right heart cath to assess PA pressures Risks including infection, bleeding discussed willing to proceed Lab called orders written Consider vasodilator study in lab if WU's high Dr Chase Caller indicates possible use of inhaled Tyvaso may be helpful   Chronic diastolic CHF - Echo 01/3342 showed LVEF 60-65% with G1DD - continue laisx  - Euvolemic on exam - continue current regimen  Hyperlipidemia - lipid profile 04/2020: total 213, HDL 56, LDL 128, TG 143 - patient has made diet/lifestyle changes - PCP plans to re-check and consider statin at that point  Pre-diabetes - last A1C 6.3 in 08/2020 - Diet and lifestyle changes as above, followed by PCP  [facility] - on CPAP with 2L at night followed by Pulmonology _____________  Time spent reviewing chart direct patient interview orders and setting up heart cath 40 minutes   Disposition:   F/u with pulmonary after right heart cath   Rozann Lesches, PA-C 11/25/2020 3:16 PM    _____________ [facility] [address] [place] [address]  [phone] (office) [phone] (fax)

## 2020-11-15 NOTE — Progress Notes (Signed)
 Cardiology Office Note  Date:  11/25/2020   ID:  Patty [name], DOB 02/12/1951, MRN [id number]  PCP:  Andy, Camille L, MD  Cardiologist:  Dr. Roberth Berling  _____________  _____________   History of Present Illness:  69 y.o. I have not seen in over 3 years. Seen by PA October of this year. Follows with Dr Ramaswamy for ILD and chronic lung disease Has had increasing dyspnea over the last few months. Echo done 07/17/20 EF 60-65% only grade one diastolic parameters no significant valve disease or suggestion of severe pulmonary HTN.  Myovue done 10/22/20 was normal with no ischemia Cath in 2018 with no CAD. Referred by pulmonary for right heart cath He felt that if she had WHO 2 pulmonary HTN that inhaled Tyvaso may help CT chest 07/09/20 showed ILD but deemed stable compared to 2018   She notes worsening desats with activity Tries to walk up to a mile daily HR higher No chest pain  She was started on meds for DM but they made her gain weight and then and endocrinologist told Her she did not have DM. She gets [facility]   Long discussion about the risks and benefits of right heart catheterization to guide Rx for presumed Pulmonary HTN Would seem to be WHO class 3 given her known lung dx ILD   Past Medical History:  Diagnosis Date  . Abnormal nuclear stress test    a. 07/2017: cath with no CAD  . Amyloidosis ([facility])   . Crohn disease ([facility])   . Crohn's colitis ([facility])   . DVT (deep venous thrombosis) ([facility])   . Grade I diastolic dysfunction   . Hyperlipidemia   . ILD (interstitial lung disease) ([facility])   . OSA on CPAP   . Pernicious anemia   . Polycythemia vera ([facility])   . Pulmonary embolism ([facility]) 2010   s/p hip surgery , reports this was never truly confirmed   . Sleep disorder 01/10/2020   Uses klonopin for sleep since 2014; sleep walking. And anxiety   Past Surgical History:  Procedure Laterality Date  . BIOPSY  09/30/2019   Procedure: BIOPSY;   Surgeon: Pyrtle, Jay M, MD;  Location: [facility];  Service: Gastroenterology;;  . COLON RESECTION  1993  . COLONOSCOPY WITH PROPOFOL N/A 09/30/2019   Procedure: COLONOSCOPY WITH PROPOFOL;  Surgeon: Pyrtle, Jay M, MD;  Location: [facility];  Service: Gastroenterology;  Laterality: N/A;  . HIP ARTHROPLASTY Right    x 2, initial right THA, then subsequent right THA revision   . LEFT HEART CATH AND CORONARY ANGIOGRAPHY N/A 07/28/2017   Procedure: Left Heart Cath and Coronary Angiography;  Surgeon: McAlhany, Christopher D, MD;  [facility];  Service: Cardiovascular;  Laterality: N/A;  . POLYPECTOMY  09/30/2019   Procedure: POLYPECTOMY;  Surgeon: Pyrtle, Jay M, MD;  Location: [facility];  Service: Gastroenterology;;   _____________  Current Outpatient Medications  Medication Sig Dispense Refill  . amoxicillin (AMOXIL) 500 MG capsule Take 500 mg by mouth 3 (three) times daily.    . aspirin EC 81 MG tablet Take 81 mg by mouth daily.    . Calcium Carbonate-Vit D-Min (CALCIUM 1200) 1200-1000 MG-UNIT CHEW     . CANNABIDIOL PO Take 1 Dose by mouth 2 (two) times daily as needed (anxiety/sleep.). CBD Oil    . cetirizine (ZYRTEC) 10 MG tablet Take 10 mg by mouth daily as needed (sinus/allergies.).    . cetirizine-pseudoephedrine (ZYRTEC-D)   5-120 MG tablet Take 1 tablet by mouth daily as needed (sinus headaches.).    . Cholecalciferol (VITAMIN D3) 50 MCG (2000 UT) TABS Take 2,000 Units by mouth daily.    . clonazePAM (KLONOPIN) 0.5 MG tablet TAKE 0.5-1 TABLETS (0.25-0.5 MG TOTAL) BY MOUTH DAILY AS NEEDED (FOR ANXIETY OR SLEEP.). 90 tablet 1  . Cyanocobalamin (VITAMIN B-12 IJ) Inject 1,000 mcg as directed every 30 (thirty) days.    . FLUoxetine (PROZAC) 10 MG capsule Take 1 capsule (10 mg total) by mouth daily. 90 capsule 3  . fluticasone (FLONASE) 50 MCG/ACT nasal spray Place 1-2 sprays into both nostrils daily as needed for allergies or rhinitis.    . Glycerin-Polysorbate 80 (REFRESH  DRY EYE THERAPY OP) Place 1 drop into both eyes 3 (three) times daily as needed (dry/irritated eyes.).    . ibuprofen (ADVIL) 200 MG tablet Take 400-800 mg by mouth every 8 (eight) hours as needed (pain.).    . mesalamine (PENTASA) 250 MG CR capsule Take 250 mg by mouth 4 (four) times daily as needed (crohn's disease flare ups).     . prednisoLONE acetate (PRED FORTE) 1 % ophthalmic suspension Place 1 drop into both eyes 2 (two) times daily as needed (Uveitis).    . Probiotic Product (PROBIOTIC PO) Take 1 capsule by mouth 2 (two) times a week.     . furosemide (LASIX) 20 MG tablet Take 20 mg by mouth every other day. (Patient not taking: Reported on 11/25/2020)    . potassium chloride (MICRO-K) 10 MEQ CR capsule Take 1 capsule (10 mEq total) by mouth every other day. (Patient not taking: Reported on 11/25/2020) 45 capsule 3   No current facility-administered medications for this visit.   _____________   Allergies:   Victoza [liraglutide], Lasix [furosemide], Metformin and related, Onion, Oxycodone, Potassium-containing compounds, Prednisone, and Sulfa drugs cross reactors  _____________   Social History:  The patient  reports that she quit smoking about 11 years ago. Her smoking use included cigarettes. She has a 45.00 pack-year smoking history. She has never used smokeless tobacco. She reports current alcohol use. She reports that she does not use drugs.  _____________   Family History:  The patient's family history includes Anxiety disorder in her father; COPD in her brother; Diabetes Mellitus II in her brother and father; Glaucoma in her brother and mother; Heart disease in her brother; High Cholesterol in her mother; High blood pressure in her mother; Non-Hodgkin's lymphoma in her brother; Sleep apnea in her father and mother.  _____________   ROS:  Please see the history of present illness.   Positive for dyspnea,   All other systems are reviewed and negative.  _____________   PHYSICAL  EXAM: VS:  BP 132/84   Pulse 75   Ht 5' 4" (1.626 m)   Wt 118.4 kg   SpO2 97%   BMI 44.80 kg/m  , BMI Body mass index is 44.8 kg/m.  Affect appropriate Healthy:  appears stated age HEENT: normal Neck supple with no adenopathy JVP normal no bruits no thyromegaly Lungs velcro crackles in lungs  Heart:  S1/S2 no murmur, no rub, gallop or click PMI normal Abdomen: benighn, BS positve, no tenderness, no AAA no bruit.  No HSM or HJR Distal pulses intact with no bruits No edema Neuro non-focal Skin warm and dry No muscular weakness  _____________  EKG:   NSR 73bpm, nonspecific ST/T wave changes  11/25/2020 SR PAC ICRBBB   Recent Labs: 06/23/2020: Pro B   Natriuretic peptide (BNP) 34.0 09/09/2020: ALT 17; Brain Natriuretic Peptide 18; BUN 21; Creat 0.81; Hemoglobin 14.4; Platelets 312; Potassium 4.5; Sodium 140; TSH 0.58  04/29/2020: Cholesterol 213; HDL 56.30; LDL Cholesterol 128; Total CHOL/HDL Ratio 4; Triglycerides 143.0; VLDL 28.6  CrCl cannot be calculated (Patient's most recent lab result is older than the maximum 21 days allowed.).  Wt Readings from Last 3 Encounters:  11/25/20 118.4 kg  10/21/20 114.8 kg  10/08/20 115.1 kg     Echo 07/17/20 1. Left ventricular ejection fraction, by estimation, is 60 to 65%. The  left ventricle has normal function. The left ventricle has no regional  wall motion abnormalities. There is moderate concentric left ventricular  hypertrophy. Left ventricular  diastolic parameters are consistent with Grade I diastolic dysfunction  (impaired relaxation).  2. Although TAPSE is normal, visually RVF appears reduced. The right  ventricular size is normal.  3. The mitral valve is normal in structure. No evidence of mitral valve  regurgitation. No evidence of mitral stenosis.  4. The aortic valve is normal in structure. Aortic valve regurgitation is  not visualized. No aortic stenosis is present.  5. The inferior vena cava is normal in size with  greater than 50%  respiratory variability, suggesting right atrial pressure of 3 mmHg.   Left heart cath 07/2017 1. No angiographic evidence of CAD 2. Elevated filling pressures  Recommendations: No further ischemic workup. Her filling pressures are elevated. She may benefit from diuretic therapy if she becomes symptomatic.    _____________   ASSESSMENT AND PLAN:  Dyspnea - No obvious cardiac etiology with normal LVEF, non ischemic myovue ILD presumably stable by CT but progressive symptoms Per pulmonary discussed right heart cath to assess PA pressures Risks including infection, bleeding discussed willing to proceed Lab called orders written Consider vasodilator study in lab if WU's high Dr Ramaswamy indicates possible use of inhaled Tyvaso may be helpful   Chronic diastolic CHF - Echo 06/2020 showed LVEF 60-65% with G1DD - continue laisx  - Euvolemic on exam - continue current regimen  Hyperlipidemia - lipid profile 04/2020: total 213, HDL 56, LDL 128, TG 143 - patient has made diet/lifestyle changes - PCP plans to re-check and consider statin at that point  Pre-diabetes - last A1C 6.3 in 08/2020 - Diet and lifestyle changes as above, followed by PCP  [facility] - on CPAP with 2L at night followed by Pulmonology _____________  Time spent reviewing chart direct patient interview orders and setting up heart cath 40 minutes   Disposition:   F/u with pulmonary after right heart cath   Signed, Malaiyah Achorn, PA-C 11/25/2020 3:16 PM    _____________ [facility] [address] [place] [address]  [phone] (office) [phone] (fax) 

## 2020-11-16 NOTE — Telephone Encounter (Signed)
Patient has appointment on 11/25/20 with Dr. Johnsie Cancel.

## 2020-11-17 ENCOUNTER — Telehealth: Payer: Self-pay | Admitting: Internal Medicine

## 2020-11-17 DIAGNOSIS — J849 Interstitial pulmonary disease, unspecified: Secondary | ICD-10-CM

## 2020-11-17 NOTE — Telephone Encounter (Signed)
Pt states she was able to find message.  Please advise.

## 2020-11-17 NOTE — Telephone Encounter (Signed)
Pt returning call.  Lost call with Mendel Ryder.  [phone].

## 2020-11-17 NOTE — Telephone Encounter (Signed)
Spoke with pt. States that she got a call from our office about discussing treatments with MR. I do not see any documentation from anyone in this office calling the pt. She was trying to retrieve the VM and the line got disconnected. I tried to call the pt back but I received a busy signal.

## 2020-11-18 NOTE — Telephone Encounter (Signed)
I have called and left a VM on the machine for the pt to return our call.

## 2020-11-25 ENCOUNTER — Other Ambulatory Visit: Payer: Self-pay

## 2020-11-25 ENCOUNTER — Encounter: Payer: Self-pay | Admitting: Cardiovascular Disease

## 2020-11-25 ENCOUNTER — Ambulatory Visit (INDEPENDENT_AMBULATORY_CARE_PROVIDER_SITE_OTHER): Payer: Medicare Other | Admitting: Cardiovascular Disease

## 2020-11-25 VITALS — BP 132/84 | HR 75 | Ht 64.0 in | Wt 261.0 lb

## 2020-11-25 DIAGNOSIS — E785 Hyperlipidemia, unspecified: Secondary | ICD-10-CM | POA: Diagnosis not present

## 2020-11-25 DIAGNOSIS — I272 Pulmonary hypertension, unspecified: Secondary | ICD-10-CM | POA: Diagnosis not present

## 2020-11-25 DIAGNOSIS — R0609 Other forms of dyspnea: Secondary | ICD-10-CM

## 2020-11-25 DIAGNOSIS — R0600 Dyspnea, unspecified: Secondary | ICD-10-CM

## 2020-11-25 DIAGNOSIS — I5032 Chronic diastolic (congestive) heart failure: Secondary | ICD-10-CM | POA: Diagnosis not present

## 2020-11-25 NOTE — Patient Instructions (Signed)
Medication Instructions:  *If you need a refill on your cardiac medications before your next appointment, please call your pharmacy*  Lab Work: Your physician recommends that you return for lab work in: 12/04/20 for BMET and CBC  If you have labs (blood work) drawn today and your tests are completely normal, you will receive your results only by: [name] MyChart Message (if you have MyChart) OR . A paper copy in the mail If you have any lab test that is abnormal or we need to change your treatment, we will call you to review the results.  Testing/Procedures: Your physician has requested that you have a cardiac catheterization. Cardiac catheterization is used to diagnose and/or treat various heart conditions. Doctors may recommend this procedure for a number of different reasons. The most common reason is to evaluate chest pain. Chest pain can be a symptom of coronary artery disease (CAD), and cardiac catheterization can show whether plaque is narrowing or blocking your heart's arteries. This procedure is also used to evaluate the valves, as well as measure the blood flow and oxygen levels in different parts of your heart. For further information please visit [email or web address]. Please follow instruction sheet, as given.  Follow-Up: At [facility], you and your health needs are our priority.  As part of our continuing mission to provide you with exceptional heart care, we have created designated Provider Care Teams.  These Care Teams include your primary Cardiologist (physician) and Advanced Practice Providers (APPs -  Physician Assistants and Nurse Practitioners) who all work together to provide you with the care you need, when you need it.  We recommend signing up for the patient portal called "MyChart".  Sign up information is provided on this After Visit Summary.  MyChart is used to connect with patients for Virtual Visits (Telemedicine).  Patients are able to view lab/test results, encounter  notes, upcoming appointments, etc.  Non-urgent messages can be sent to your provider as well.   To learn more about what you can do with MyChart, go to [email or web address].    Your next appointment:   6 weeks  The format for your next appointment:   In Person  Provider:   You may see Dr. Johnsie Cancel or one of the following Advanced Practice Providers on your designated Care Team:    Truitt Merle, NP  Cecilie Kicks, NP  Kathyrn Drown, NP    Other Instructions    [facility] OFFICE [place], [place] [address] Dept: [phone] Loc: [place]  11/25/2020  You are scheduled for a Cardiac Catheterization on Monday, December 13 with Dr. Sherren Mocha.  1. Please arrive at the [facility] (Main Entrance A) at [facility]: [address] [place], [place] [address] at 8:30 AM (This time is two hours before your procedure to ensure your preparation). Free valet parking service is available.   Special note: Every effort is made to have your procedure done on time. Please understand that emergencies sometimes delay scheduled procedures.  2. Diet: Do not eat solid foods after midnight.  The patient may have clear liquids until 5am upon the day of the procedure.  3. Labs: You will need to have blood drawn on Friday, December 10 at [facility] at [facility]. [address]  Open: 7:30am - 5pm    Phone: [phone]. You do not need to be fasting.  Due to recent  COVID-19 restrictions implemented by our local and state authorities and in an effort to keep both patients and staff as safe as possible, our hospital system requires COVID-19 testing prior to certain scheduled hospital procedures.  Please go to [place]. [place], [place] [address] on 12/04/20 at 1:00 pm  .  This is a drive up testing site.  You will not need to exit  your vehicle.  You will not be billed at the time of testing but may receive a bill later depending on your insurance. You must agree to self-quarantine from the time of your testing until the procedure date on 12/04/20.  This should included staying home with ONLY the people you live with.  Avoid take-out, grocery store shopping or leaving the house for any non-emergent reason.  Failure to have your COVID-19 test done on the date and time you have been scheduled will result in cancellation of your procedure.  Please call our office at [phone] if you have any questions.   4. Medication instructions in preparation for your procedure:   Contrast Allergy: No  Stop taking, Lasix (Furosemide)  Monday, December 13,  On the morning of your procedure, take your Aspirin and any morning medicines NOT listed above.  You may use sips of water.  5. Plan for one night stay--bring personal belongings. 6. Bring a current list of your medications and current insurance cards. 7. You MUST have a responsible person to drive you home. 8. Someone MUST be with you the first 24 hours after you arrive home or your discharge will be delayed. 9. Please wear clothes that are easy to get on and off and wear slip-on shoes.  Thank you for allowing [name] to care for you!   -- [facility]

## 2020-11-26 ENCOUNTER — Inpatient Hospital Stay (HOSPITAL_COMMUNITY)
Admission: RE | Admit: 2020-11-26 | Discharge: 2020-11-26 | Disposition: A | Discharge status: Home / Self Care | Payer: Medicare Other | Source: Ambulatory Visit

## 2020-11-26 ENCOUNTER — Telehealth: Payer: Self-pay | Admitting: *Deleted

## 2020-11-26 ENCOUNTER — Encounter (HOSPITAL_COMMUNITY): Payer: Self-pay | Admitting: Physician Assistant

## 2020-11-26 NOTE — Telephone Encounter (Signed)
   [facility] Pre-operative Risk Assessment    HEARTCARE STAFF: - Please ensure there is not already an duplicate clearance open for this procedure. - Under Visit Info/Reason for Call, type in Other and utilize the format Clearance MM/DD/YY or Clearance TBD. Do not use dashes or single digits. - If request is for dental extraction, please clarify the # of teeth to be extracted.  Request for surgical clearance:   1. What type of surgery is being performed? RIGHT BREAST LUMPECTOMY   2. When is this surgery scheduled? 12/01/20   3. What type of clearance is required (medical clearance vs. Pharmacy clearance to hold med vs. Both)? MEDICAL  4. Are there any medications that need to be held prior to surgery and how long? ASA    5. Practice name and name of physician performing surgery? [facility]; DR. Coralie Keens   6. What is the office phone number? [phone]   7.   What is the office fax number? [place]: Malachi Bonds, CMA  8.   Anesthesia type (None, local, MAC, general) ? GENERAL   Julaine Hua 11/26/2020, 12:29 PM  _________________________________________________________________   (provider comments below)

## 2020-11-26 NOTE — Telephone Encounter (Signed)
Left VM on both numbers.

## 2020-11-26 NOTE — Pre-Procedure Instructions (Signed)
Patty [name] [facility]  11/26/2020      [id number] [place] [address]: [phone]: [id number] [place] [place]Phone: [phone]: [phone]   Your procedure is scheduled on Dec. 7  [facility]  Call this number if you have problems the morning of surgery:  [phone]   Remember:  Do not eat  after midnight.   You may drink clear liquids until 11:30 A.M..  Clear liquids allowed are:                    Water, Juice (non-citric and without pulp - diabetics please choose diet or no sugar options), Carbonated beverages - (diabetics please choose diet or no sugar options), Clear Tea, Black Coffee only (no creamer, milk or cream including half and half), Plain Jell-O only (diabetics please choose diet or no sugar options), Gatorade (diabetics please choose diet or no sugar options) and Plain Popsicles only    Take these medicines the morning of surgery with A SIP OF WATER :              Cetirizine (zyrtec) if needed             Clonazepam (klonopin) if needed             Fluoxetine (prozac)             flonase if needed             Eye drops if needed              7 days prior to surgery STOP taking any Aspirin (unless otherwise instructed by your surgeon), Aleve, Naproxen, Ibuprofen, Motrin, Advil, Goody's, BC's, all herbal medications, fish oil, and all vitamins.       Follow your surgeon's instructions on when to stop Aspirin.  If no instructions were given by your surgeon then you will need to call the office to get those instructions.       Do not wear jewelry, make-up or nail polish.  Do not wear lotions, powders, or perfumes, or deodorant.  Do not shave 48 hours prior to surgery.  Men may shave face and neck.  Do not bring valuables to the hospital.  [facility].  Contacts, dentures or bridgework may not be worn into surgery.  Leave your suitcase in the car.  After surgery it may be brought to your room.  For patients admitted to the hospital, discharge time will be determined by your treatment team.  Patients discharged the day of surgery will not be allowed to drive home.    Special instructions:   [facility]- Preparing For Surgery  Before surgery, you can play an important role. Because skin is not sterile, your skin needs to be as free of germs as possible. You can reduce the number of germs on your skin by washing with CHG (chlorahexidine gluconate) Soap before surgery.  CHG is an antiseptic cleaner which kills germs and bonds with the skin to continue killing germs even after washing.    Oral Hygiene is also important to reduce your risk of infection.  Remember - BRUSH YOUR TEETH THE MORNING OF SURGERY WITH YOUR REGULAR TOOTHPASTE  Please do not use if you  have an allergy to CHG or antibacterial soaps. If your skin becomes reddened/irritated stop using the CHG.  Do not shave (including legs and underarms) for at least 48 hours prior to first CHG shower. It is OK to shave your face.  Please follow these instructions carefully.   1. Shower the NIGHT BEFORE SURGERY and the MORNING OF SURGERY with CHG.   2. If you chose to wash your hair, wash your hair first as usual with your normal shampoo.  3. After you shampoo, rinse your hair and body thoroughly to remove the shampoo.  4. Use CHG as you would any other liquid soap. You can apply CHG directly to the skin and wash gently with a scrungie or a clean washcloth.   5. Apply the CHG Soap to your body ONLY FROM THE NECK DOWN.  Do not use on open wounds or open sores. Avoid contact with your eyes, ears, mouth and genitals (private parts). Wash Face and genitals (private parts)  with your normal soap.  6. Wash thoroughly, paying special  attention to the area where your surgery will be performed.  7. Thoroughly rinse your body with warm water from the neck down.  8. DO NOT shower/wash with your normal soap after using and rinsing off the CHG Soap.  9. Pat yourself dry with a CLEAN TOWEL.  10. Wear CLEAN PAJAMAS to bed the night before surgery, wear comfortable clothes the morning of surgery  11. Place CLEAN SHEETS on your bed the night of your first shower and DO NOT SLEEP WITH PETS.    Day of Surgery:  Do not apply any deodorants/lotions.  Please wear clean clothes to the hospital/surgery center.   Remember to brush your teeth WITH YOUR REGULAR TOOTHPASTE.    Please read over the following fact sheets that you were given.

## 2020-11-26 NOTE — Telephone Encounter (Signed)
Please see pt's email from 11.23.21. Will sign off.

## 2020-11-26 NOTE — Telephone Encounter (Signed)
Attempted to call pt but unable to reach. Left message for her to return call.  Left pt a detailed message that MR is wanting [name] to get her scheduled for a PFT and OV after.

## 2020-11-27 ENCOUNTER — Other Ambulatory Visit (HOSPITAL_COMMUNITY): Payer: Medicare Other

## 2020-11-27 ENCOUNTER — Other Ambulatory Visit: Payer: Self-pay | Admitting: Cardiovascular Disease

## 2020-11-27 ENCOUNTER — Telehealth: Payer: Self-pay | Admitting: Internal Medicine

## 2020-11-27 MED ORDER — SODIUM CHLORIDE 0.9% FLUSH: 3.0000 mL | Freq: Two times a day (BID) | INTRAVENOUS | Status: DC

## 2020-11-27 NOTE — Telephone Encounter (Signed)
Patty [name]  Patient can be given 1 appt with APP in between cath and seeing me/pft  Thanks  MR

## 2020-11-27 NOTE — Telephone Encounter (Signed)
Mychart message sent by pt as an FYI in regards to upcoming appts: Just an FYI - The cardiac cath is scheduled for 13DEC - The earliest appointment I could get after 10a is 31Jan with Dr. Purnell Shoemaker and the PFT the week before. PFT is scheduled 01/19/21 prior to the [place] on 01/24/21.  Routing to MR as an Pharmacist, hospital.

## 2020-11-27 NOTE — Telephone Encounter (Signed)
Called and spoke with pt letting her know the info stated by MR and she verbalized understanding. appt has been scheduled for pt with TP 12/17. Nothing further needed.

## 2020-11-30 ENCOUNTER — Inpatient Hospital Stay: Admission: RE | Admit: 2020-11-30 | Payer: Medicare Other | Source: Ambulatory Visit

## 2020-11-30 NOTE — Progress Notes (Signed)
Patty [name] from [facility] called me and stated that pt's surgery is being cancelled.

## 2020-11-30 NOTE — Progress Notes (Signed)
Called Dr. Trevor Mace office to check to see if pt's surgery had been cancelled. Spoke to [place], one of the schedulers. She is going to call pt and then she will call me back to let me know if surgery is cancelled.

## 2020-12-01 ENCOUNTER — Encounter (HOSPITAL_COMMUNITY): Admission: RE | Payer: Self-pay | Source: Home / Self Care

## 2020-12-01 ENCOUNTER — Ambulatory Visit (HOSPITAL_COMMUNITY): Admission: RE | Admit: 2020-12-01 | Payer: Medicare Other | Source: Home / Self Care | Admitting: Surgery

## 2020-12-01 SURGERY — BREAST LUMPECTOMY WITH RADIOACTIVE SEED LOCALIZATION
Anesthesia: Choice | Site: Breast | Laterality: Right

## 2020-12-01 NOTE — Telephone Encounter (Signed)
° °  Pt is returning call

## 2020-12-01 NOTE — Telephone Encounter (Signed)
The patient was contacted 5 days ago for preop clearance, a message was left for her to call back.  She called back today.  The patient was to have a breast biopsy but apparently this has been put on hold until after her right heart cath which is scheduled for December 13.  She will decide about the breast biopsy after this.  Kerin Ransom PA-C 12/01/2020 3:33 PM

## 2020-12-03 ENCOUNTER — Ambulatory Visit (INDEPENDENT_AMBULATORY_CARE_PROVIDER_SITE_OTHER): Payer: Medicare Other

## 2020-12-03 ENCOUNTER — Telehealth: Payer: Self-pay | Admitting: *Deleted

## 2020-12-03 DIAGNOSIS — Z0000 Encounter for general adult medical examination without abnormal findings: Secondary | ICD-10-CM | POA: Diagnosis not present

## 2020-12-03 NOTE — Progress Notes (Cosign Needed Addendum)
Virtual Visit via Telephone Note  I connected with  [name] on 12/03/20 at  1:45 PM EST by telephone and verified that I am speaking with the correct person using two identifiers.  Medicare Annual Wellness visit completed telephonically due to Covid-19 pandemic.   Persons participating in this call: This Health Coach and this patient.   Location: Patient: Home Provider: Office   I discussed the limitations, risks, security and privacy concerns of performing an evaluation and management service by telephone and the availability of in person appointments. The patient expressed understanding and agreed to proceed.  Unable to perform video visit due to video visit attempted and failed and/or patient does not have video capability.   Some vital signs may be absent or patient reported.   Willette Brace, LPN    Subjective:   Patty [name] is a 69 y.o. female who presents for an Initial Medicare Annual Wellness Visit.  Review of Systems     Cardiac Risk Factors include: advanced age (>38mn, >>51women);dyslipidemia;obesity (BMI >30kg/m2)     Objective:    There were no vitals filed for this visit. There is no height or weight on file to calculate BMI.  Advanced Directives 12/03/2020 09/30/2019 10/30/2017 10/12/2017 07/28/2017  Does Patient Have a Medical Advance Directive? _0   Type of AParamedicof APort HopeLiving will HDanaLiving will HFost[place]Living will HPhilipLiving will Living will  Does patient want to make changes to medical advance directive? - - Yes (Inpatient - patient defers changing a medical advance directive at this time) - No - Patient declined  Copy of [place]in Chart? No - copy requested No - copy requested No - copy requested Yes -    Current Medications (verified) Outpatient Encounter Medications as of 12/03/2020   Medication Sig  . amoxicillin (AMOXIL) 500 MG capsule Take 500 mg by mouth 3 (three) times daily.  .Marland Kitchenaspirin EC 81 MG tablet Take 81 mg by mouth daily.  . Calcium Carbonate-Vit D-Min (CALCIUM 1200) 1200-1000 MG-UNIT CHEW   . CANNABIDIOL PO Take 1 Dose by mouth 2 (two) times daily as needed (anxiety/sleep.). CBD Oil  . cetirizine (ZYRTEC) 10 MG tablet Take 10 mg by mouth daily as needed (sinus/allergies.).  .Marland Kitchencetirizine-pseudoephedrine (ZYRTEC-D) 5-120 MG tablet Take 1 tablet by mouth daily as needed (sinus headaches.).  .Marland KitchenCholecalciferol (VITAMIN D3) 50 MCG (2000 UT) TABS Take 2,000 Units by mouth daily.  . clonazePAM (KLONOPIN) 0.5 MG tablet TAKE 0.5-1 TABLETS (0.25-0.5 MG TOTAL) BY MOUTH DAILY AS NEEDED (FOR ANXIETY OR SLEEP.).  .Marland KitchenCyanocobalamin (VITAMIN B-12 IJ) Inject 1,000 mcg as directed every 30 (thirty) days.  .Marland KitchenFLUoxetine (PROZAC) 10 MG capsule Take 1 capsule (10 mg total) by mouth daily.  . fluticasone (FLONASE) 50 MCG/ACT nasal spray Place 1-2 sprays into both nostrils daily as needed for allergies or rhinitis.  . Glycerin-Polysorbate 80 (REFRESH DRY EYE THERAPY OP) Place 1 drop into both eyes 3 (three) times daily as needed (dry/irritated eyes.).  .Marland Kitchenibuprofen (ADVIL) 200 MG tablet Take 400-800 mg by mouth every 8 (eight) hours as needed (pain.).  .Marland Kitchenmesalamine (PENTASA) 250 MG CR capsule Take 250 mg by mouth 4 (four) times daily as needed (crohn's disease flare ups).   . Multiple Vitamins-Minerals (ICAPS AREDS 2 PO)   . prednisoLONE acetate (PRED FORTE) 1 % ophthalmic suspension Place 1 drop into both eyes 2 (two) times  daily as needed (Uveitis).  . Probiotic Product (PROBIOTIC PO) Take 1 capsule by mouth 2 (two) times a week.   . [DISCONTINUED] furosemide (LASIX) 20 MG tablet Take 20 mg by mouth every other day. (Patient not taking: Reported on 11/25/2020)  . [DISCONTINUED] potassium chloride (MICRO-K) 10 MEQ CR capsule Take 1 capsule (10 mEq total) by mouth every other day. (Patient  not taking: Reported on 11/25/2020)   No facility-administered encounter medications on file as of 12/03/2020.    Allergies (verified) Victoza [liraglutide], Lasix [furosemide], Metformin and related, Onion, Oxycodone, Potassium-containing compounds, Prednisone, and Sulfa drugs cross reactors   History: Past Medical History:  Diagnosis Date  . Abnormal nuclear stress test    a. 07/2017: cath with no CAD  . Amyloidosis ([place])   . Crohn disease ([place])   . Crohn's colitis ([place])   . DVT (deep venous thrombosis) ([place])   . Grade I diastolic dysfunction   . Hyperlipidemia   . ILD (interstitial lung disease) ([place])   . OSA on CPAP   . Pernicious anemia   . Polycythemia vera ([place])   . Pulmonary embolism ([place]) 2010   s/p hip surgery , reports this was never truly confirmed   . Sleep disorder 01/10/2020   Uses klonopin for sleep since 2014; sleep walking. And anxiety   Past Surgical History:  Procedure Laterality Date  . BIOPSY  09/30/2019   Procedure: BIOPSY;  Surgeon: Jerene Bears, MD;  Location: [facility];  Service: Gastroenterology;;  . [place]  . COLONOSCOPY WITH PROPOFOL N/A 09/30/2019   Procedure: COLONOSCOPY WITH PROPOFOL;  Surgeon: Jerene Bears, MD;  Location: [facility];  Service: Gastroenterology;  Laterality: N/A;  . HIP ARTHROPLASTY Right    x 2, initial right THA, then subsequent right THA revision   . LEFT HEART CATH AND CORONARY ANGIOGRAPHY N/A 07/28/2017   Procedure: Left Heart Cath and Coronary Angiography;  Surgeon: Burnell Blanks, MD;  Location: [facility];  Service: Cardiovascular;  Laterality: N/A;  . POLYPECTOMY  09/30/2019   Procedure: POLYPECTOMY;  Surgeon: Jerene Bears, MD;  Location: [facility];  Service: Gastroenterology;;   Family History  Problem Relation Age of Onset  . Glaucoma Mother   . High blood pressure Mother   . High Cholesterol Mother   . Sleep apnea Mother   . Diabetes Mellitus II Father   . Sleep apnea Father    . Anxiety disorder Father   . COPD Brother   . Heart disease Brother   . Non-Hodgkin's lymphoma Brother   . Diabetes Mellitus II Brother   . Glaucoma Brother   . Colon cancer Neg Hx   . Esophageal cancer Neg Hx   . Pancreatic cancer Neg Hx   . Stomach cancer Neg Hx   . Liver disease Neg Hx    Social History   Socioeconomic History  . Marital status: Single    Spouse name: Not on file  . Number of children: 0  . Years of education: Not on file  . Highest education level: Not on file  Occupational History  . Occupation: retired  Tobacco Use  . Smoking status: Former Smoker    Packs/day: 1.00    Years: 45.00    Pack years: 45.00    Types: Cigarettes    Quit date: 12/26/2008    Years since quitting: 11.9  . Smokeless tobacco: Never Used  Vaping Use  . Vaping Use: Never used  Substance and Sexual Activity  .  Alcohol use: Yes    Comment: occ  . Drug use: No  . Sexual activity: Not Currently  Other Topics Concern  . 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    Social Determinants of Health   Financial Resource Strain: Low Risk   . Difficulty of Paying Living Expenses: Not hard at all  Food Insecurity: No Food Insecurity  . Worried About Charity fundraiser in the Last Year: Never true  . Ran Out of Food in the Last Year: Never true  Transportation Needs: No Transportation Needs  . Lack of Transportation (Medical): No  . Lack of Transportation (Non-Medical): No  Physical Activity: Insufficiently Active  . Days of Exercise per Week: 4 days  . Minutes of Exercise per Session: 30 min  Stress: Stress Concern Present  . Feeling of Stress : To some extent  Social Connections: Socially Isolated  . Frequency of Communication with Friends and Family: Twice a week  . Frequency of Social Gatherings with Friends and Family: Never  . Attends Religious Services: 1 to 4 times per year  . Active  Member of Clubs or Organizations: No  . Attends Archivist Meetings: Never  . Marital Status: Never married    Tobacco Counseling Counseling given: Not Answered   Clinical Intake:  Pre-visit preparation completed: Yes  Pain : No/denies pain     BMI - recorded: 44.8 Nutritional Status: BMI > 30  Obese Nutritional Risks: None Diabetes: No  How often do you need to have someone help you when you read instructions, pamphlets, or other written materials from your doctor or pharmacy?: 1 - Never  Diabetic?No  Interpreter Needed?: No  Information entered by :: Charlott Rakes, LPN   Activities of Daily Living In your present state of health, do you have any difficulty performing the following activities: 12/03/2020 04/29/2020  Hearing? Y N  Comment mild loss in right ear -  Vision? N N  Difficulty concentrating or making decisions? Y N  Comment concentrating when reading -  Walking or climbing stairs? Y N  Comment SOB at times -  Dressing or bathing? N N  Doing errands, shopping? N N  Preparing Food and eating ? N -  Using the Toilet? N -  In the past six months, have you accidently leaked urine? N -  Do you have problems with loss of bowel control? N -  Managing your Medications? N -  Managing your Finances? N -  Housekeeping or managing your Housekeeping? N -  Some recent data might be hidden    Patient Care Team: [name], MD as PCP - General (Family Medicine) Pyrtle, Lajuan Lines, MD as Consulting Physician (Gastroenterology) [facility], Melanie Crazier, MD as Consulting Physician (Endocrinology) Brand Males, MD as Consulting Physician (Pulmonary Disease)  Indicate any recent Medical Services you may have received from other than [facility] providers in the past year (date may be approximate).     Assessment:   This is a routine wellness examination for Patty [name].  Hearing/Vision screen  Hearing Screening   _0  _1  _2  _3  _4  _5  _6   _7  _8   Right ear:           Left ear:           Comments: Pt states mild loss in right ear  Vision Screening Comments: Pt follows up with Dr Herbert Deaner for annual eye exams  Dietary  issues and exercise activities discussed: Current Exercise Habits: The patient does not participate in regular exercise at present, Exercise limited by: respiratory conditions(s)  Goals    . Patient Stated     Lose weight      Depression Screen PHQ 2/9 Scores 12/03/2020 04/29/2020 01/10/2020 04/05/2018 10/30/2017  PHQ - 2 Score 1 0 0 1 1  PHQ- 9 Score - 6 - 6 -  Exception Documentation - - - Medical reason -    Fall Risk Fall Risk  12/03/2020 03/16/2020 10/30/2017  Falls in the past year? 0 0 No  Number falls in past yr: 0 - -  Injury with Fall? 0 - -  Risk for fall due to : Impaired vision - -  Follow up Falls prevention discussed Falls evaluation completed -    FALL RISK PREVENTION PERTAINING TO THE HOME:  Any stairs in or around the home? No  If so, are there any without handrails? No  Home free of loose throw rugs in walkways, pet beds, electrical cords, etc? Yes  Adequate lighting in your home to reduce risk of falls? Yes   ASSISTIVE DEVICES UTILIZED TO PREVENT FALLS:  Life alert? No  Use of a cane, walker or w/c? No  Grab bars in the bathroom? Yes  Shower chair or bench in shower? No  Elevated toilet seat or a handicapped toilet? Yes   TIMED UP AND GO:  Was the test performed? No .     Cognitive Function:     6CIT Screen 12/03/2020  What Year? 0 points  What month? 0 points  Count back from 20 0 points  Months in reverse 0 points  Repeat phrase 2 points    Immunizations Immunization History  Administered Date(s) Administered  . Fluad Quad(high Dose 65+) 10/07/2019, 09/09/2020  . Influenza, High Dose Seasonal PF 08/28/2018  . Influenza-Unspecified 11/23/2017  . PFIZER SARS-COV-2 Vaccination 02/16/2020, 03/17/2020  . Pneumococcal Conjugate-13 02/13/2018  . Pneumococcal  Polysaccharide-23 03/12/2019    TDAP status: Due, Education has been provided regarding the importance of this vaccine. Advised may receive this vaccine at local pharmacy or Health Dept. Aware to provide a copy of the vaccination record if obtained from local pharmacy or Health Dept. Verbalized acceptance and understanding.  Flu Vaccine status: Up to date  Done 09/09/20 Pneumococcal vaccine status: Up to date  Covid-19 vaccine status: Completed vaccines  Qualifies for Shingles Vaccine? Yes   Zostavax completed No   Shingrix Completed?: No.    Education has been provided regarding the importance of this vaccine. Patient has been advised to call insurance company to determine out of pocket expense if they have not yet received this vaccine. Advised may also receive vaccine at local pharmacy or Health Dept. Verbalized acceptance and understanding.  Screening Tests Health Maintenance  Topic Date Due  . TETANUS/TDAP  12/28/2020 (Originally 09/03/1970)  . COVID-19 Vaccine (3 - Pfizer risk 4-dose series) 12/28/2020 (Originally 04/14/2020)  . MAMMOGRAM  08/04/2021  . DEXA SCAN  08/04/2022  . COLONOSCOPY  09/29/2029  . INFLUENZA VACCINE  Completed  . Hepatitis C Screening  Completed  . PNA vac Low Risk Adult  Completed    Health Maintenance  There are no preventive care reminders to display for this patient.  Colorectal cancer screening: Type of screening: Colonoscopy. Completed 09/30/19. Repeat every 10 years  Mammogram status: Completed 09/01/20. Repeat every year  Bone Density status: Completed 08/04/20. Results reflect: Bone density results: OSTEOPENIA. Repeat every 2 years.   Additional  Screening:  Hepatitis C Screening:  Completed 04/29/20  Vision Screening: Recommended annual ophthalmology exams for early detection of glaucoma and other disorders of the eye. Is the patient up to date with their annual eye exam?  Yes  Who is the provider or what is the name of the office in which the  patient attends annual eye exams? Dr  Herbert Deaner    Dental Screening: Recommended annual dental exams for proper oral hygiene  Community Resource Referral / Chronic Care Management: CRR required this visit?  No   CCM required this visit?  No      Plan:     I have personally reviewed and noted the following in the patient's chart:   . Medical and social history . Use of alcohol, tobacco or illicit drugs  . Current medications and supplements . Functional ability and status . Nutritional status . Physical activity . Advanced directives . List of other physicians . Hospitalizations, surgeries, and ER visits in previous 12 months . Vitals . Screenings to include cognitive, depression, and falls . Referrals and appointments  In addition, I have reviewed and discussed with patient certain preventive protocols, quality metrics, and best practice recommendations. A written personalized care plan for preventive services as well as general preventive health recommendations were provided to patient.     Willette Brace, LPN   [id number]   Nurse Notes: Pt has increased prozac to 20 mg she stated per your conversation at last visit with Dr Jonni Sanger.  She is requesting a new prescription for the 52m  please advise.

## 2020-12-03 NOTE — Telephone Encounter (Signed)
Pt contacted pre-[facility]HC scheduled at [facility] for: Monday December 07, 2020 10:30 AM Verified arrival time and place: [facility]) at: 8:30 AM   No solid food after midnight prior to cath, clear liquids until 5 AM day of procedure.  Hold: Lasix/KCl- pt reports she is not taking   AM meds can be  taken pre-cath with sips of water.    Confirmed patient has responsible adult to drive home post procedure and be with patient first 24 hours after arriving home: * see notes below  You are allowed ONE visitor in the waiting room during the time you are at the hospital for your procedure. Both you and your visitor must wear a mask once you enter the hospital.       COVID-19 Pre-Screening Questions:  . In the past 14 days have you had any symptoms concerning for COVID-19 infection (fever, chills, cough, or new shortness of breath)? no . In the past 14 days have you been around anyone with known Covid 19? no    * pt states she would have transportation to and from hospital, but would not have responsible adult to be with her first 24 hours if she is same day discharge.

## 2020-12-03 NOTE — Patient Instructions (Signed)
Patty [name] , Thank you for taking time to come for your Medicare Wellness Visit. I appreciate your ongoing commitment to your health goals. Please review the following plan we discussed and let me know if I can assist you in the future.   Screening recommendations/referrals: Colonoscopy: Done 09/30/19 Mammogram: Done 09/01/20 Bone Density: Done 08/04/20 Recommended yearly ophthalmology/optometry visit for glaucoma screening and checkup Recommended yearly dental visit for hygiene and checkup  Vaccinations: Influenza vaccine: Done 09/09/20 Up to date Pneumococcal vaccine: Up to date Tdap vaccine: Declined and discussed Shingles vaccine: Declined and discussed  Covid-19:Completed 02/16/20 & 03/17/20  Advanced directives: Please bring a copy of your health care power of attorney and living will to the office at your convenience.  Conditions/risks identified: lose weight  Next appointment: Follow up in one year for your annual wellness visit    Preventive Care 65 Years and Older, Female Preventive care refers to lifestyle choices and visits with your health care provider that can promote health and wellness. What does preventive care include?  A yearly physical exam. This is also called an annual well check.  Dental exams once or twice a year.  Routine eye exams. Ask your health care provider how often you should have your eyes checked.  Personal lifestyle choices, including:  Daily care of your teeth and gums.  Regular physical activity.  Eating a healthy diet.  Avoiding tobacco and drug use.  Limiting alcohol use.  Practicing safe sex.  Taking low-dose aspirin every day.  Taking vitamin and mineral supplements as recommended by your health care provider. What happens during an annual well check? The services and screenings done by your health care provider during your annual well check will depend on your age, overall health, lifestyle risk factors, and family history of  disease. Counseling  Your health care provider may ask you questions about your:  Alcohol use.  Tobacco use.  Drug use.  Emotional well-being.  Home and relationship well-being.  Sexual activity.  Eating habits.  History of falls.  Memory and ability to understand (cognition).  Work and work Statistician.  Reproductive health. Screening  You may have the following tests or measurements:  Height, weight, and BMI.  Blood pressure.  Lipid and cholesterol levels. These may be checked every 5 years, or more frequently if you are over 28 years old.  Skin check.  Lung cancer screening. You may have this screening every year starting at age 62 if you have a 30-pack-year history of smoking and currently smoke or have quit within the past 15 years.  Fecal occult blood test (FOBT) of the stool. You may have this test every year starting at age 36.  Flexible sigmoidoscopy or colonoscopy. You may have a sigmoidoscopy every 5 years or a colonoscopy every 10 years starting at age 81.  Hepatitis C blood test.  Hepatitis B blood test.  Sexually transmitted disease (STD) testing.  Diabetes screening. This is done by checking your blood sugar (glucose) after you have not eaten for a while (fasting). You may have this done every 1-3 years.  Bone density scan. This is done to screen for osteoporosis. You may have this done starting at age 15.  Mammogram. This may be done every 1-2 years. Talk to your health care provider about how often you should have regular mammograms. Talk with your health care provider about your test results, treatment options, and if necessary, the need for more tests. Vaccines  Your health care provider may recommend certain  vaccines, such as:  Influenza vaccine. This is recommended every year.  Tetanus, diphtheria, and acellular pertussis (Tdap, Td) vaccine. You may need a Td booster every 10 years.  Zoster vaccine. You may need this after age  59.  Pneumococcal 13-valent conjugate (PCV13) vaccine. One dose is recommended after age 59.  Pneumococcal polysaccharide (PPSV23) vaccine. One dose is recommended after age 21. Talk to your health care provider about which screenings and vaccines you need and how often you need them. This information is not intended to replace advice given to you by your health care provider. Make sure you discuss any questions you have with your health care provider. Document Released: 01/08/2016 Document Revised: 08/31/2016 Document Reviewed: 10/13/2015 Elsevier Interactive Patient Education  2017 [place] Prevention in the Home Falls can cause injuries. They can happen to people of all ages. There are many things you can do to make your home safe and to help prevent falls. What can I do on the outside of my home?  Regularly fix the edges of walkways and driveways and fix any cracks.  Remove anything that might make you trip as you walk through a door, such as a raised step or threshold.  Trim any bushes or trees on the path to your home.  Use bright outdoor lighting.  Clear any walking paths of anything that might make someone trip, such as rocks or tools.  Regularly check to see if handrails are loose or broken. Make sure that both sides of any steps have handrails.  Any raised decks and porches should have guardrails on the edges.  Have any leaves, snow, or ice cleared regularly.  Use sand or salt on walking paths during winter.  Clean up any spills in your garage right away. This includes oil or grease spills. What can I do in the bathroom?  Use night lights.  Install grab bars by the toilet and in the tub and shower. Do not use towel bars as grab bars.  Use non-skid mats or decals in the tub or shower.  If you need to sit down in the shower, use a plastic, non-slip stool.  Keep the floor dry. Clean up any water that spills on the floor as soon as it happens.  Remove  soap buildup in the tub or shower regularly.  Attach bath mats securely with double-sided non-slip rug tape.  Do not have throw rugs and other things on the floor that can make you trip. What can I do in the bedroom?  Use night lights.  Make sure that you have a light by your bed that is easy to reach.  Do not use any sheets or blankets that are too big for your bed. They should not hang down onto the floor.  Have a firm chair that has side arms. You can use this for support while you get dressed.  Do not have throw rugs and other things on the floor that can make you trip. What can I do in the kitchen?  Clean up any spills right away.  Avoid walking on wet floors.  Keep items that you use a lot in easy-to-reach places.  If you need to reach something above you, use a strong step stool that has a grab bar.  Keep electrical cords out of the way.  Do not use floor polish or wax that makes floors slippery. If you must use wax, use non-skid floor wax.  Do not have throw rugs and other things  on the floor that can make you trip. What can I do with my stairs?  Do not leave any items on the stairs.  Make sure that there are handrails on both sides of the stairs and use them. Fix handrails that are broken or loose. Make sure that handrails are as long as the stairways.  Check any carpeting to make sure that it is firmly attached to the stairs. Fix any carpet that is loose or worn.  Avoid having throw rugs at the top or bottom of the stairs. If you do have throw rugs, attach them to the floor with carpet tape.  Make sure that you have a light switch at the top of the stairs and the bottom of the stairs. If you do not have them, ask someone to add them for you. What else can I do to help prevent falls?  Wear shoes that:  Do not have high heels.  Have rubber bottoms.  Are comfortable and fit you well.  Are closed at the toe. Do not wear sandals.  If you use a  stepladder:  Make sure that it is fully opened. Do not climb a closed stepladder.  Make sure that both sides of the stepladder are locked into place.  Ask someone to hold it for you, if possible.  Clearly mark and make sure that you can see:  Any grab bars or handrails.  First and last steps.  Where the edge of each step is.  Use tools that help you move around (mobility aids) if they are needed. These include:  Canes.  Walkers.  Scooters.  Crutches.  Turn on the lights when you go into a dark area. Replace any light bulbs as soon as they burn out.  Set up your furniture so you have a clear path. Avoid moving your furniture around.  If any of your floors are uneven, fix them.  If there are any pets around you, be aware of where they are.  Review your medicines with your doctor. Some medicines can make you feel dizzy. This can increase your chance of falling. Ask your doctor what other things that you can do to help prevent falls. This information is not intended to replace advice given to you by your health care provider. Make sure you discuss any questions you have with your health care provider. Document Released: 10/08/2009 Document Revised: 05/19/2016 Document Reviewed: 01/16/2015 Elsevier Interactive Patient Education  2017 [employer].

## 2020-12-04 ENCOUNTER — Other Ambulatory Visit (HOSPITAL_COMMUNITY)
Admission: RE | Admit: 2020-12-04 | Discharge: 2020-12-04 | Disposition: A | Discharge status: Home / Self Care | Payer: Medicare Other | Source: Ambulatory Visit | Attending: Cardiovascular Disease | Admitting: Cardiovascular Disease

## 2020-12-04 ENCOUNTER — Other Ambulatory Visit: Payer: Self-pay

## 2020-12-04 ENCOUNTER — Other Ambulatory Visit: Payer: Medicare Other | Admitting: *Deleted

## 2020-12-04 DIAGNOSIS — Z01812 Encounter for preprocedural laboratory examination: Secondary | ICD-10-CM | POA: Diagnosis not present

## 2020-12-04 DIAGNOSIS — Z20822 Contact with and (suspected) exposure to covid-19: Secondary | ICD-10-CM | POA: Diagnosis not present

## 2020-12-04 DIAGNOSIS — I5032 Chronic diastolic (congestive) heart failure: Secondary | ICD-10-CM | POA: Diagnosis not present

## 2020-12-04 DIAGNOSIS — E785 Hyperlipidemia, unspecified: Secondary | ICD-10-CM | POA: Diagnosis not present

## 2020-12-04 DIAGNOSIS — R0600 Dyspnea, unspecified: Secondary | ICD-10-CM | POA: Diagnosis not present

## 2020-12-04 DIAGNOSIS — I272 Pulmonary hypertension, unspecified: Secondary | ICD-10-CM | POA: Diagnosis not present

## 2020-12-04 LAB — SARS CORONAVIRUS 2 (TAT 6-24 HRS): SARS Coronavirus 2: NEGATIVE

## 2020-12-04 MED ORDER — FLUOXETINE HCL 20 MG PO CAPS: 20.0000 mg | ORAL_CAPSULE | Freq: Every day | ORAL | 3 refills | Status: DC

## 2020-12-04 NOTE — Progress Notes (Signed)
Patient increased Prozac "about two weeks ago and seems to be doing the trick." I have sent in new script to pharmacy. States she will call back to schedule f/u for next month. Waiting on surgery scheduling for lumpectomy

## 2020-12-04 NOTE — Progress Notes (Signed)
Please contact patient: I last saw her in September.  When did she increase her prozac and how is it working.  Please send in new RX for 47m po qd #90, 3 rf.  Please schedule a f/u visit with me in January.   I have reviewed the documented AWV and agree with the findings as documented by our nurse educator.  I was available for consultation during the visit. I will follow up with patient on any outstanding screens that are needed, results or new concerns that were found on this documented screening visit.

## 2020-12-04 NOTE — Progress Notes (Signed)
FYI

## 2020-12-05 LAB — BASIC METABOLIC PANEL
BUN/Creatinine Ratio: 25 (ref 12–28)
BUN: 23 mg/dL (ref 8–27)
CO2: 23 mmol/L (ref 20–29)
Calcium: 9.7 mg/dL (ref 8.7–10.3)
Chloride: 101 mmol/L (ref 96–106)
Creatinine, Ser: 0.91 mg/dL (ref 0.57–1.00)
GFR calc Af Amer: 74 mL/min/{1.73_m2} (ref >59)
GFR calc non Af Amer: 64 mL/min/{1.73_m2} (ref >59)
Glucose: 94 mg/dL (ref 65–99)
Potassium: 4.2 mmol/L (ref 3.5–5.2)
Sodium: 139 mmol/L (ref 134–144)

## 2020-12-05 LAB — CBC WITH DIFFERENTIAL/PLATELET
Basophils Absolute: 0.1 10*3/uL (ref 0.0–0.2)
Basos: 1 %
EOS (ABSOLUTE): 0.2 10*3/uL (ref 0.0–0.4)
Eos: 2 %
Hematocrit: 45.5 % (ref 34.0–46.6)
Hemoglobin: 15.1 g/dL (ref 11.1–15.9)
Immature Grans (Abs): 0 10*3/uL (ref 0.0–0.1)
Immature Granulocytes: 0 %
Lymphocytes Absolute: 2 10*3/uL (ref 0.7–3.1)
Lymphs: 22 %
MCH: 29.9 pg (ref 26.6–33.0)
MCHC: 33.2 g/dL (ref 31.5–35.7)
MCV: 90 fL (ref 79–97)
Monocytes Absolute: 0.8 10*3/uL (ref 0.1–0.9)
Monocytes: 9 %
Neutrophils Absolute: 6 10*3/uL (ref 1.4–7.0)
Neutrophils: 66 %
Platelets: 316 10*3/uL (ref 150–450)
RBC: 5.05 x10E6/uL (ref 3.77–5.28)
RDW: 14 % (ref 11.7–15.4)
WBC: 8.9 10*3/uL (ref 3.4–10.8)

## 2020-12-07 ENCOUNTER — Encounter (HOSPITAL_COMMUNITY)
Admission: RE | Disposition: A | Discharge status: Home / Self Care | Payer: Self-pay | Source: Home / Self Care | Attending: Cardiovascular Disease

## 2020-12-07 ENCOUNTER — Encounter (HOSPITAL_COMMUNITY): Payer: Self-pay | Admitting: Cardiovascular Disease

## 2020-12-07 ENCOUNTER — Other Ambulatory Visit: Payer: Self-pay

## 2020-12-07 ENCOUNTER — Ambulatory Visit (HOSPITAL_COMMUNITY)
Admission: RE | Admit: 2020-12-07 | Discharge: 2020-12-07 | Disposition: A | Discharge status: Home / Self Care | Payer: Medicare Other | Attending: Cardiovascular Disease | Admitting: Cardiovascular Disease

## 2020-12-07 DIAGNOSIS — Z882 Allergy status to sulfonamides status: Secondary | ICD-10-CM | POA: Insufficient documentation

## 2020-12-07 DIAGNOSIS — R7303 Prediabetes: Secondary | ICD-10-CM | POA: Insufficient documentation

## 2020-12-07 DIAGNOSIS — I2699 Other pulmonary embolism without acute cor pulmonale: Secondary | ICD-10-CM | POA: Diagnosis present

## 2020-12-07 DIAGNOSIS — Z7982 Long term (current) use of aspirin: Secondary | ICD-10-CM | POA: Insufficient documentation

## 2020-12-07 DIAGNOSIS — I272 Pulmonary hypertension, unspecified: Secondary | ICD-10-CM | POA: Diagnosis not present

## 2020-12-07 DIAGNOSIS — G4733 Obstructive sleep apnea (adult) (pediatric): Secondary | ICD-10-CM | POA: Diagnosis not present

## 2020-12-07 DIAGNOSIS — E785 Hyperlipidemia, unspecified: Secondary | ICD-10-CM | POA: Diagnosis not present

## 2020-12-07 DIAGNOSIS — Z885 Allergy status to narcotic agent status: Secondary | ICD-10-CM | POA: Diagnosis not present

## 2020-12-07 DIAGNOSIS — Z888 Allergy status to other drugs, medicaments and biological substances status: Secondary | ICD-10-CM | POA: Insufficient documentation

## 2020-12-07 DIAGNOSIS — I5032 Chronic diastolic (congestive) heart failure: Secondary | ICD-10-CM | POA: Insufficient documentation

## 2020-12-07 DIAGNOSIS — Z87891 Personal history of nicotine dependence: Secondary | ICD-10-CM | POA: Diagnosis not present

## 2020-12-07 DIAGNOSIS — R0600 Dyspnea, unspecified: Secondary | ICD-10-CM | POA: Diagnosis not present

## 2020-12-07 DIAGNOSIS — J849 Interstitial pulmonary disease, unspecified: Secondary | ICD-10-CM | POA: Diagnosis present

## 2020-12-07 HISTORY — PX: RIGHT HEART CATH: CATH118263

## 2020-12-07 LAB — POCT I-STAT EG7
Acid-Base Excess: 0 mmol/L (ref 0.0–2.0)
Acid-Base Excess: 0 mmol/L (ref 0.0–2.0)
Acid-Base Excess: 1 mmol/L (ref 0.0–2.0)
Bicarbonate: 26.6 mmol/L (ref 20.0–28.0)
Bicarbonate: 26.9 mmol/L (ref 20.0–28.0)
Bicarbonate: 27 mmol/L (ref 20.0–28.0)
Calcium, Ion: 1.1 mmol/L — ABNORMAL LOW (ref 1.15–1.40)
Calcium, Ion: 1.18 mmol/L (ref 1.15–1.40)
Calcium, Ion: 1.23 mmol/L (ref 1.15–1.40)
HCT: 40 % (ref 36.0–46.0)
HCT: 41 % (ref 36.0–46.0)
HCT: 41 % (ref 36.0–46.0)
Hemoglobin: 13.6 g/dL (ref 12.0–15.0)
Hemoglobin: 13.9 g/dL (ref 12.0–15.0)
Hemoglobin: 13.9 g/dL (ref 12.0–15.0)
O2 Saturation: 62 %
O2 Saturation: 62 %
O2 Saturation: 64 %
Potassium: 3.9 mmol/L (ref 3.5–5.1)
Potassium: 4 mmol/L (ref 3.5–5.1)
Potassium: 4 mmol/L (ref 3.5–5.1)
Sodium: 141 mmol/L (ref 135–145)
Sodium: 142 mmol/L (ref 135–145)
Sodium: 142 mmol/L (ref 135–145)
TCO2: 28 mmol/L (ref 22–32)
TCO2: 28 mmol/L (ref 22–32)
TCO2: 28 mmol/L (ref 22–32)
pCO2, Ven: 48.4 mmHg (ref 44.0–60.0)
pCO2, Ven: 49 mmHg (ref 44.0–60.0)
pCO2, Ven: 49.2 mmHg (ref 44.0–60.0)
pH, Ven: 7.342 (ref 7.250–7.430)
pH, Ven: 7.347 (ref 7.250–7.430)
pH, Ven: 7.354 (ref 7.250–7.430)
pO2, Ven: 34 mmHg (ref 32.0–45.0)
pO2, Ven: 34 mmHg (ref 32.0–45.0)
pO2, Ven: 36 mmHg (ref 32.0–45.0)

## 2020-12-07 SURGERY — RIGHT HEART CATH

## 2020-12-07 MED ORDER — LIDOCAINE HCL (PF) 1 % IJ SOLN
INTRAMUSCULAR | Status: DC | PRN
  Administered 2020-12-07: 2 mL

## 2020-12-07 MED ORDER — SODIUM CHLORIDE 0.9 % IV SOLN: 250.0000 mL | INTRAVENOUS | Status: DC | PRN

## 2020-12-07 MED ORDER — ASPIRIN 81 MG PO CHEW: 81.0000 mg | CHEWABLE_TABLET | ORAL | Status: DC

## 2020-12-07 MED ORDER — SODIUM CHLORIDE 0.9% FLUSH: 3.0000 mL | INTRAVENOUS | Status: DC | PRN

## 2020-12-07 MED ORDER — SODIUM CHLORIDE 0.9 % WEIGHT BASED INFUSION
3.0000 mL/kg/h | INTRAVENOUS | Status: AC
  Administered 2020-12-07: 09:00:00 3 mL/kg/h via INTRAVENOUS

## 2020-12-07 MED ORDER — HEPARIN (PORCINE) IN NACL 1000-0.9 UT/500ML-% IV SOLN
INTRAVENOUS | Status: AC
  Filled 2020-12-07: qty 500

## 2020-12-07 MED ORDER — LIDOCAINE HCL (PF) 1 % IJ SOLN
INTRAMUSCULAR | Status: AC
  Filled 2020-12-07: qty 30

## 2020-12-07 MED ORDER — SODIUM CHLORIDE 0.9 % WEIGHT BASED INFUSION: 1.0000 mL/kg/h | INTRAVENOUS | Status: DC

## 2020-12-07 MED ORDER — HEPARIN (PORCINE) IN NACL 1000-0.9 UT/500ML-% IV SOLN
INTRAVENOUS | Status: DC | PRN
  Administered 2020-12-07: 500 mL

## 2020-12-07 SURGICAL SUPPLY — 9 items
CATH BALLN WEDGE 5F 110CM (CATHETERS) ×3 IMPLANT
GUIDEWIRE .025 260CM (WIRE) ×3 IMPLANT
PACK CARDIAC CATHETERIZATION (CUSTOM PROCEDURE TRAY) ×3 IMPLANT
PROTECTION STATION PRESSURIZED (MISCELLANEOUS) ×3
SHEATH GLIDE SLENDER 4/5FR (SHEATH) ×3 IMPLANT
STATION PROTECTION PRESSURIZED (MISCELLANEOUS) ×1 IMPLANT
TRANSDUCER W/STOPCOCK (MISCELLANEOUS) ×3 IMPLANT
TUBING ART PRESS 72  MALE/FEM (TUBING) ×2
TUBING ART PRESS 72 MALE/FEM (TUBING) ×1 IMPLANT

## 2020-12-07 NOTE — Discharge Instructions (Signed)
Brachial Site Care  This sheet gives you information about how to care for yourself after your procedure. Your health care provider may also give you more specific instructions. If you have problems or questions, contact your health care provider. What can I expect after the procedure? After the procedure, it is common to have:  Bruising and tenderness at the catheter insertion area. Follow these instructions at home: Medicines  Take over-the-counter and prescription medicines only as told by your health care provider. Insertion site care  Follow instructions from your health care provider about how to take care of your insertion site. Make sure you: ? Wash your hands with soap and water before you change your bandage (dressing). If soap and water are not available, use hand sanitizer. ? Remove your dressing as told by your health care provider. In 24 hours.   Check your insertion site every day for signs of infection. Check for: ? Redness, swelling, or pain. ? Fluid or blood. ? Pus or a bad smell. ? Warmth.  Do not take baths, swim, or use a hot tub until your health care provider approves.  You may shower 24-48 hours after the procedure, or as directed by your health care provider. ? Remove the dressing and gently wash the site with plain soap and water. ? Pat the area dry with a clean towel. ? Do not rub the site. That could cause bleeding.  Do not apply powder or lotion to the site. Activity   For 24 hours after the procedure, or as directed by your health care provider: ? Do not flex or bend the affected arm. ? Do not push or pull heavy objects with the affected arm. ? Do not drive yourself home from the hospital or clinic. You may drive 24 hours after the procedure unless your health care provider tells you not to. ? Do not operate machinery or power tools.  Do not lift anything that is heavier than 10 lb (4.5 kg), or the limit that you are told, until your health care  provider says that it is safe. For 2 days   Ask your health care provider when it is okay to: ? Return to work or school. ? Resume usual physical activities or sports. ? Resume sexual activity. General instructions  If the catheter site starts to bleed, raise your arm and put firm pressure on the site. If the bleeding does not stop, get help right away.   If you went home on the same day as your procedure, a responsible adult should be with you for the first 24 hours after you arrive home.  Keep all follow-up visits as told by your health care provider. This is important. Contact a health care provider if:  You have a fever.  You have redness, swelling, or yellow drainage around your insertion site. Get help right away if:  You have unusual pain at the brachial site.  The catheter insertion area swells very fast.  The insertion area is bleeding, and the bleeding does not stop when you hold steady pressure on the area.  These symptoms may represent a serious problem that is an emergency. Do not wait to see if the symptoms will go away. Get medical help right away. Call your local emergency services (911 in the [place] Do not drive yourself to the hospital. Summary  After the procedure, it is common to have bruising and tenderness at the site.  Follow instructions from your health care provider about how to  take care of your brachial site wound. Check the wound every day for signs of infection.  Do not lift anything that is heavier than 10 lb (4.5 kg), or the limit that you are told, until your health care provider says that it is safe. This information is not intended to replace advice given to you by your health care provider. Make sure you discuss any questions you have with your health care provider. Document Revised: 01/17/2018 Document Reviewed: 01/17/2018 Elsevier Patient Education  2020 [employer].

## 2020-12-07 NOTE — Interval H&P Note (Signed)
History and Physical Interval Note:  12/07/2020 10:20 AM  Patty [name]  has presented today for surgery, with the diagnosis of pulmonary hypertension.  The various methods of treatment have been discussed with the patient and family. After consideration of risks, benefits and other options for treatment, the patient has consented to  Procedure(s): RIGHT HEART CATH (N/A) as a surgical intervention.  The patient's history has been reviewed, patient examined, no change in status, stable for surgery.  I have reviewed the patient's chart and labs.  Questions were answered to the patient's satisfaction.     Sherren Mocha

## 2020-12-11 ENCOUNTER — Ambulatory Visit (INDEPENDENT_AMBULATORY_CARE_PROVIDER_SITE_OTHER): Payer: Medicare Other | Admitting: Adult Health

## 2020-12-11 ENCOUNTER — Encounter: Payer: Self-pay | Admitting: Adult Health

## 2020-12-11 ENCOUNTER — Other Ambulatory Visit: Payer: Self-pay

## 2020-12-11 VITALS — BP 130/90 | HR 87 | Temp 97.0°F | Ht 64.0 in | Wt 258.6 lb

## 2020-12-11 DIAGNOSIS — I272 Pulmonary hypertension, unspecified: Secondary | ICD-10-CM | POA: Diagnosis not present

## 2020-12-11 DIAGNOSIS — G4733 Obstructive sleep apnea (adult) (pediatric): Secondary | ICD-10-CM

## 2020-12-11 DIAGNOSIS — Z9989 Dependence on other enabling machines and devices: Secondary | ICD-10-CM

## 2020-12-11 DIAGNOSIS — J849 Interstitial pulmonary disease, unspecified: Secondary | ICD-10-CM

## 2020-12-11 NOTE — Progress Notes (Signed)
_0  ID: Patty [name], female    DOB: 04/03/1951, 69 y.o.   MRN: [id number]  Chief Complaint  Patient presents with  . Follow-up    Referring provider: [name], MD  HPI: 69 year old female former smoker followed for interstitial lung disease and emphysema  Medical history significant for Crohn's disease, diastolic heart failure   TEST/EVENTS :  07/10/20 HRCT- Spectrum of findings suggestive of a mild basilar predominant fibrotic interstitial lung disease without appreciable interval progression since baseline 05/12/2017 high-resolution chest CT. Favor NSIP, with UIP not excluded. Dilated main pulmonary artery, stable, suggesting chronic pulmonary arterial hypertension. Aortic Atherosclerosis (ICD10-I70.0).  Cardiac testing:  Echocardiogram- EF 60-65%, moderate left ventricular hypertrophy, grade 1 diastolic dysfunction   Pulmonary testing: 06/23/20 Overnight oximetry - Spent 30 mins with O2 <88% while on CPAP  12/11/2020 Follow up : ILD and Emphysema  Returns for a 100-monthfollow-up.  Patient has underlying mild interstitial lung disease and emphysema.  Patient complained over the last year she has noted that she has had increase shortness of breath and decreased activity tolerance.  Last visit patient was noted to have some mild drop in DLCO.  Pulmonary function testing in October 2020 showed FEV1 at 74%, ratio 69, FVC 81%, DLCO 75%.  June PFTs were repeated with an FEV1 at 82%, ratio 63, FVC 82%.  DLCO was at 70%. Patient was referred to cardiology 2D echo July showed EF at 64854% grade 1 diastolic dysfunction.  Right ventricle normal stress Lexi scan showed normal with low risk.  No sign of ischemia with normal pump function.  Patient underwent a right heart cath that showed moderate pulmonary hypertension with PA pressure 50/29, mean 38 mmHg. We discussed her test results. Patient has underlying obstructive sleep apnea.  Is on nocturnal CPAP.  She is very  compliant.  Which she has 100% compliance.  Daily average usage at 9.5 hours.  She has AHI at 1.2.  And minimum leaks.  She is on auto CPAP 5 to 15 cm H2O.  Daily average pressure at 7.8 cm H2O. She denies any hemoptysis, chest pain.  Lower extremity edema has been doing well.  She was unable to tolerate Lasix and potassium supplement..   Allergies  Allergen Reactions  . Victoza [Liraglutide] Shortness Of Breath  . Lasix [Furosemide] Itching    Hypersensitivity   . Metformin And Related Diarrhea    Increased moodiness  . Onion Other (See Comments)    sereve diarrhea, stomach pains (Crohn's flares)  . Oxycodone Other (See Comments)    Altered mental status  . Potassium-Containing Compounds Other (See Comments)    Due to chron's, difficult passing though kidneys not allowing absorption in the body   . Prednisone Other (See Comments)    Altered mood  . Sulfa Drugs Cross Reactors Other (See Comments)    "feels like bugs crawling on me"    Immunization History  Administered Date(s) Administered  . Fluad Quad(high Dose 65+) 10/07/2019, 09/09/2020  . Influenza, High Dose Seasonal PF 08/28/2018  . Influenza-Unspecified 11/23/2017  . PFIZER SARS-COV-2 Vaccination 02/16/2020, 03/17/2020  . Pneumococcal Conjugate-13 02/13/2018  . Pneumococcal Polysaccharide-23 03/12/2019    Past Medical History:  Diagnosis Date  . Abnormal nuclear stress test    a. 07/2017: cath with no CAD  . [place]   . Cro[place] ([place]   . Crohn's colitis ([place]   . D[place]) ([place]   . Grade I diastolic dysfunction   . Hyperlipidemia   .  ILD (interstitial lung disease) ([place])   . OSA on CPAP   . Pernicious anemia   . Polycythemia vera ([place])   . Pulmonary embolism ([place]) 2010   s/p hip surgery , reports this was never truly confirmed   . Sleep disorder 01/10/2020   Uses klonopin for sleep since 2014; sleep walking. And anxiety    Tobacco History: Social History   Tobacco Use   Smoking Status Former Smoker  . Packs/day: 1.00  . Years: 45.00  . Pack years: 45.00  . Types: Cigarettes  . Quit date: 12/26/2008  . Years since quitting: 11.9  Smokeless Tobacco Never Used   Counseling given: Not Answered   Outpatient Medications Prior to Visit  Medication Sig Dispense Refill  . amoxicillin (AMOXIL) 500 MG capsule Take 500 mg by mouth 3 (three) times daily.    [name] aspirin EC 81 MG tablet Take 81 mg by mouth daily.    [name] b complex vitamins capsule Take 1 capsule by mouth once a week.    . Calcium Carbonate-Vit D-Min (CALCIUM 1200) 1200-1000 MG-UNIT CHEW Chew 1 tablet by mouth daily at 12 noon.    [name] CANNABIDIOL PO Take 1 Dose by mouth 2 (two) times daily as needed (anxiety/sleep.). CBD Oil    . cetirizine (ZYRTEC) 10 MG tablet Take 10 mg by mouth daily as needed (sinus/allergies.).    [name] cetirizine-pseudoephedrine (ZYRTEC-D) 5-120 MG tablet Take 1 tablet by mouth daily as needed (sinus headaches.).    [name] cholecalciferol (VITAMIN D) 25 MCG (1000 UNIT) tablet Take 1,000 Units by mouth daily.    . clonazePAM (KLONOPIN) 0.5 MG tablet TAKE 0.5-1 TABLETS (0.25-0.5 MG TOTAL) BY MOUTH DAILY AS NEEDED (FOR ANXIETY OR SLEEP.). 90 tablet 1  . Cyanocobalamin (VITAMIN B-12 IJ) Inject 1,000 mcg as directed every 30 (thirty) days.    [name] FLUoxetine (PROZAC) 20 MG capsule Take 1 capsule (20 mg total) by mouth daily. 90 capsule 3  . fluticasone (FLONASE) 50 MCG/ACT nasal spray Place 1-2 sprays into both nostrils daily as needed for allergies or rhinitis.    . Glycerin-Polysorbate 80 (REFRESH DRY EYE THERAPY OP) Place 1 drop into both eyes daily.    [name] ibuprofen (ADVIL) 200 MG tablet Take 400-800 mg by mouth every 8 (eight) hours as needed (pain.).    [name] mesalamine (PENTASA) 250 MG CR capsule Take 250-500 mg by mouth 4 (four) times daily as needed (crohn's disease flare ups).    . Multiple Vitamins-Minerals (ICAPS AREDS 2 PO) Take 1 tablet by mouth daily.    . prednisoLONE acetate (PRED FORTE) 1  % ophthalmic suspension Place 1 drop into both eyes 2 (two) times daily as needed (Uveitis).    . Probiotic Product (PROBIOTIC PO) Take 1 capsule by mouth 2 (two) times a week. As needed     No facility-administered medications prior to visit.     Review of Systems:   Constitutional:   No  weight loss, night sweats,  Fevers, chills,  +fatigue, or  lassitude.  HEENT:   No headaches,  Difficulty swallowing,  Tooth/dental problems, or  Sore throat,                No sneezing, itching, ear ache, nasal congestion, post nasal drip,   CV:  No chest pain,  Orthopnea, PND, swelling in lower extremities, anasarca, dizziness, palpitations, syncope.   GI  No heartburn, indigestion, abdominal pain, nausea, vomiting, diarrhea, change in bowel habits, loss of appetite, bloody stools.   Resp:  .  No chest wall deformity  Skin: no rash or lesions.  GU: no dysuria, change in color of urine, no urgency or frequency.  No flank pain, no hematuria   MS:  No joint pain or swelling.  No decreased range of motion.  No back pain.    Physical Exam  BP 130/90 (BP Location: Left Arm, Patient Position: Sitting, Cuff Size: Normal)   Pulse 87   Temp (!) 97 F (36.1 C) (Temporal)   Ht 5' 4" (1.626 m)   Wt 258 lb 9.6 oz (117.3 kg)   SpO2 91%   BMI 44.39 kg/m   GEN: A/Ox3; pleasant , NAD, well nourished    HEENT:  [place]/AT,    NOSE-clear, THROAT-clear, no lesions, no postnasal drip or exudate noted.   NECK:  Supple w/ fair ROM; no JVD; normal carotid impulses w/o bruits; no thyromegaly or nodules palpated; no lymphadenopathy.    RESP  Clear  P & A; w/o, wheezes/ rales/ or rhonchi. no accessory muscle use, no dullness to percussion  CARD:  RRR, no m/r/g, tr peripheral edema, pulses intact, no cyanosis or clubbing.  GI:   Soft & nt; nml bowel sounds; no organomegaly or masses detected.   Musco: Warm bil, no deformities or joint swelling noted.   Neuro: alert, no focal deficits noted.    Skin: Warm,  no lesions or rashes    Lab Results:     Imaging: CARDIAC CATHETERIZATION  Result Date: 12/07/2020  Hemodynamic findings consistent with moderate pulmonary hypertension.  RA 13/12 mean 9 mmHg RV 48/8, EDP 12 PA 50/29 mean 38 PCWP 22/20 mean 20 O2 saturations: PA 62, Ao 97, SVC 64 CO 4.26 L/m, CI 1.96 Trans-pulmonic gradient 18 mmHg PVR 4.2 [place] Units    regadenoson (LEXISCAN) injection SOLN 0.4 mg    Date Action Dose Route User   Discharged on 12/07/2020   Admitted on 12/07/2020   10/21/2020 1359 Given 0.4 mg Intravenous Hornowski Kubak, Elzbieta    technetium tetrofosmin (TC-MYOVIEW) injection 74.2 millicurie    Date Action Dose Route User   Discharged on 12/07/2020   Admitted on 12/07/2020   10/21/2020 1359 Contrast Given 59.5 millicurie Intravenous Hornowski Kubak, Elzbieta    technetium tetrofosmin (TC-MYOVIEW) injection 63.8 millicurie    Date Action Dose Route User   Discharged on 12/07/2020   Admitted on 12/07/2020   10/22/2020 1318 Contrast Given 75.6 millicurie Intravenous Hornowski Earl Many      PFT Results Latest Ref Rng & Units 06/09/2020 10/01/2019 03/12/2019 08/28/2018 02/05/2018 05/17/2017  FVC-Pre L 2.42 2.38 2.73 2.80 2.57 2.61  FVC-Predicted Pre % 82 81 91 94 83 84  FVC-Post L - - - - - 2.54  FVC-Predicted Post % - - - - - 81  Pre FEV1/FVC % % 63 69 67 70 67 70  Post FEV1/FCV % % - - - - - 73  FEV1-Pre L 1.53 1.65 1.83 1.96 1.71 1.83  FEV1-Predicted Pre % 68 74 80 86 72 77  FEV1-Post L - - - - - 1.84  DLCO uncorrected ml/min/mmHg 13.48 14.39 13.77 14.53 15.15 12.64  DLCO UNC% % 70 75 72 62 62 52  DLCO corrected ml/min/mmHg 12.89 - - - - 12.31  DLCO COR %Predicted % 67 - - - - 50  DLVA Predicted % 70 75 72 65 65 60  TLC L - - - - - 5.09  TLC % Predicted % - - - - - 100  RV % Predicted % - - - - -  115    No results found for: NITRICOXIDE      Assessment & Plan:   ILD (interstitial lung disease) ([place]) CT chest shows no significant  change in the last 3 years.  PFTs did show a slight decrease in her DLCO.  Suspect her dyspnea is multifactorial with underlying interstitial lung disease, emphysema, diastolic dysfunction and pulmonary hypertension. For now continue on current regimen.  We'll follow-up PFTs on return visit   Plan  Patient Instructions  Continue on Oxygen 3l/m with activity .-continuous flow during exercise/ heavy walking . Goal is to have O2 sats >88-90%.  Continue on CPAP At bedtime with Oxygen. Low salt diet .  Work on healthy weight loss .  Refer to Pulmonary rehab.  Follow up with Cardiology next month as planned.  Follow up Dr. Chase Caller in 6 week with PFT and As needed   Please contact office for sooner follow up if symptoms do not improve or worsen or seek emergency care       Pulmonary hypertension, unspecified ([place]) Right heart cath shows moderate pulmonary hypertension.  She has underlying obstructive sleep apnea and interstitial lung disease.  We'll continue on oxygen to keep O2 saturation greater than 88 to 90%. Continue to control underlying autoimmune disorder with Crohn's disease. Avoid volume overload.  Hold on additional diuretics currently as she has no evidence of volume overload.  Might need to consider HCTZ and/or Aldactone going forward if needed  OSA on CPAP Continue on CPAP at bedtime.  Patient has excellent control and compliance on nocturnal CPAP  Plan  Patient Instructions  Continue on Oxygen 3l/m with activity .-continuous flow during exercise/ heavy walking . Goal is to have O2 sats >88-90%.  Continue on CPAP At bedtime with Oxygen. Low salt diet .  Work on healthy weight loss .  Refer to Pulmonary rehab.  Follow up with Cardiology next month as planned.  Follow up Dr. Chase Caller in 6 week with PFT and As needed   Please contact office for sooner follow up if symptoms do not improve or worsen or seek emergency care    '      Abdulaziz Toman, NP 12/11/2020

## 2020-12-11 NOTE — Assessment & Plan Note (Signed)
Right heart cath shows moderate pulmonary hypertension.  She has underlying obstructive sleep apnea and interstitial lung disease.  We'll continue on oxygen to keep O2 saturation greater than 88 to 90%. Continue to control underlying autoimmune disorder with Crohn's disease. Avoid volume overload.  Hold on additional diuretics currently as she has no evidence of volume overload.  Might need to consider HCTZ and/or Aldactone going forward if needed

## 2020-12-11 NOTE — Patient Instructions (Addendum)
Continue on Oxygen 3l/m with activity .-continuous flow during exercise/ heavy walking . Goal is to have O2 sats >88-90%.  Continue on CPAP At bedtime with Oxygen. Low salt diet .  Work on healthy weight loss .  Refer to Pulmonary rehab.  Follow up with Cardiology next month as planned.  Follow up Dr. Chase Caller in 6 week with PFT and As needed   Please contact office for sooner follow up if symptoms do not improve or worsen or seek emergency care

## 2020-12-11 NOTE — Assessment & Plan Note (Signed)
Continue on CPAP at bedtime.  Patient has excellent control and compliance on nocturnal CPAP  Plan  Patient Instructions  Continue on Oxygen 3l/m with activity .-continuous flow during exercise/ heavy walking . Goal is to have O2 sats >88-90%.  Continue on CPAP At bedtime with Oxygen. Low salt diet .  Work on healthy weight loss .  Refer to Pulmonary rehab.  Follow up with Cardiology next month as planned.  Follow up Dr. Chase Caller in 6 week with PFT and As needed   Please contact office for sooner follow up if symptoms do not improve or worsen or seek emergency care    '

## 2020-12-11 NOTE — Assessment & Plan Note (Signed)
CT chest shows no significant change in the last 3 years.  PFTs did show a slight decrease in her DLCO.  Suspect her dyspnea is multifactorial with underlying interstitial lung disease, emphysema, diastolic dysfunction and pulmonary hypertension. For now continue on current regimen.  We'll follow-up PFTs on return visit   Plan  Patient Instructions  Continue on Oxygen 3l/m with activity .-continuous flow during exercise/ heavy walking . Goal is to have O2 sats >88-90%.  Continue on CPAP At bedtime with Oxygen. Low salt diet .  Work on healthy weight loss .  Refer to Pulmonary rehab.  Follow up with Cardiology next month as planned.  Follow up Dr. Chase Caller in 6 week with PFT and As needed   Please contact office for sooner follow up if symptoms do not improve or worsen or seek emergency care

## 2020-12-14 ENCOUNTER — Other Ambulatory Visit (HOSPITAL_COMMUNITY): Payer: Medicare Other

## 2020-12-14 DIAGNOSIS — Z23 Encounter for immunization: Secondary | ICD-10-CM | POA: Diagnosis not present

## 2020-12-17 ENCOUNTER — Encounter (HOSPITAL_COMMUNITY): Payer: Self-pay | Admitting: *Deleted

## 2020-12-17 NOTE — Progress Notes (Signed)
Received referral from Dr. Chase Caller for this pt to participate in pulmonary rehab with the the diagnosis of ILD. Clinical review of pt follow up appt on 12/17 Pulmonary office note.  Pt with Covid Risk Score - 6. Pt appropriate for scheduling for Pulmonary rehab.  Will forward to support staff for scheduling and verification of insurance eligibility/benefits with pt consent. Cherre Huger, BSN Cardiac and Training and development officer

## 2021-01-03 ENCOUNTER — Other Ambulatory Visit: Payer: Self-pay | Admitting: Family Medicine

## 2021-01-04 ENCOUNTER — Telehealth (HOSPITAL_COMMUNITY): Payer: Self-pay

## 2021-01-04 NOTE — Telephone Encounter (Signed)
Pt insurance is active and benefits verified through Medicare a/b Co-pay 0, DED $233/0 met, out of pocket 0/0 met, co-insurance 20%. no pre-authorization required.  2ndary insurance is active and benefits verified through [facility]. Co-pay 0, DED 0/0 met, out of pocket 0/0 met, co-insurance 0%. No pre-authorization required.

## 2021-01-06 NOTE — Progress Notes (Signed)
Cardiology Office Note   Date:  01/13/2021   ID:  Patty [name], DOB 11/03/1951, MRN [id number]  PCP:  [name], MD  Cardiologist:  Dr. Jenkins Rouge, MD   Chief Complaint  Patient presents with  . Follow-up   History of Present Illness: Patty [name] is a 70 y.o. female who presents for follow up, seen for Dr. Johnsie Cancel  Patty [name] has a hx of worsening DOE, ILD on home O2 with activity followed by pulmonary, morbid obesity, pernicious anemia, OSA on CPAP, remote tobacco use, Crohn's disease, and pre-diabetes.   She was last seen in follow up with Dr. Johnsie Cancel 11/25/20 at which time she had persistently worsening DOE. She had been attempting to increase her activity by walking 4-5 days per week however she began to notice dropping O2 saturations. She was unable to match previous distances due to worsening shortness of breath therefore she was seen by pulmonology and was evaluated with a full workup that showed worsening breathing capacities however this was felt to be secondary to worsening ILD. She also tested her for OSA and put her on 2L at night for CPAP. Pulmonology recommended ischemic eval to rule out CAD.  An echo was ordered 07/17/20 which showed LVEF 60-65% with no WMA, G1DD. She then underwent a Myoview stress test 10/22/20 which showed no ischemia or infarct. Pulmonology was asking for a RHC to assess PA pressures for Tyvaso consideration    RHC performed 12/07/20 with reading as below:  RA 13/12 mean 9 mmHg RV 48/8, EDP 12 PA 50/29 mean 38 PCWP 22/20 mean 20  O2 saturations: PA 62, Ao 97, SVC 64  CO 4.26 L/m, CI 1.96 Trans-pulmonic gradient 18 mmHg PVR 4.2 Woods Units  RHC>>indicates moderate pulmonary hypertension   Today she presents for follow up and reports that she has been doing ok since last seen. She has close follow up with PFT testing 01/25/21. She states that she continues to wear home oxygen. She states that she had some improvement in  her breathing after loosing about 30lb of weight however she was then started on Metformin for pre-diabetes and could not tolerate this medication due to her Crohns disease. She was sent to an endocrinologist who took her off. She unfortunately gained 40lbs back and feels back to her previous baseline. She is asking about adding Lipitor to her regimen but is reluctant because of her Crohns and multiple intolerances with other medications. She would like to attempt lifestyle modifications and recheck lipids with PCP next month. She denies chest pain, LE edema, orthopnea, or palpitations.   Past Medical History:  Diagnosis Date  . Abnormal nuclear stress test    a. 07/2017: cath with no CAD  . Amyloidosis ([place])   . Crohn disease ([place])   . Crohn's colitis ([place])   . DVT (deep venous thrombosis) ([place])   . Grade I diastolic dysfunction   . Hyperlipidemia   . ILD (interstitial lung disease) ([place])   . OSA on CPAP   . Pernicious anemia   . Polycythemia vera ([place])   . Pulmonary embolism ([place]) 2010   s/p hip surgery , reports this was never truly confirmed   . Sleep disorder 01/10/2020   Uses klonopin for sleep since 2014; sleep walking. And anxiety    Past Surgical History:  Procedure Laterality Date  . BIOPSY  09/30/2019   Procedure: BIOPSY;  Surgeon: Jerene Bears, MD;  Location: [facility];  Service: Gastroenterology;;  .  COLON RESECTION  1993  . COLONOSCOPY WITH PROPOFOL N/A 09/30/2019   Procedure: COLONOSCOPY WITH PROPOFOL;  Surgeon: Jerene Bears, MD;  Location: [facility];  Service: Gastroenterology;  Laterality: N/A;  . HIP ARTHROPLASTY Right    x 2, initial right THA, then subsequent right THA revision   . LEFT HEART CATH AND CORONARY ANGIOGRAPHY N/A 07/28/2017   Procedure: Left Heart Cath and Coronary Angiography;  Surgeon: Burnell Blanks, MD;  Location: [facility];  Service: Cardiovascular;  Laterality: N/A;  . POLYPECTOMY  09/30/2019   Procedure: POLYPECTOMY;   Surgeon: Jerene Bears, MD;  Location: [facility];  Service: Gastroenterology;;  . RIGHT HEART CATH N/A 12/07/2020   Procedure: RIGHT HEART CATH;  Surgeon: Sherren Mocha, MD;  Location: [facility];  Service: Cardiovascular;  Laterality: N/A;     Current Outpatient Medications  Medication Sig Dispense Refill  . amoxicillin (AMOXIL) 500 MG capsule Take 500 mg by mouth 3 (three) times daily.    [name] aspirin EC 81 MG tablet Take 81 mg by mouth daily.    [name] b complex vitamins capsule Take 1 capsule by mouth once a week.    . Calcium Carbonate-Vit D-Min (CALCIUM 1200) 1200-1000 MG-UNIT CHEW Chew 1 tablet by mouth daily at 12 noon.    [name] CANNABIDIOL PO Take 1 Dose by mouth 2 (two) times daily as needed (anxiety/sleep.). CBD Oil    . cetirizine (ZYRTEC) 10 MG tablet Take 10 mg by mouth daily as needed (sinus/allergies.).    [name] cetirizine-pseudoephedrine (ZYRTEC-D) 5-120 MG tablet Take 1 tablet by mouth daily as needed (sinus headaches.).    [name] cholecalciferol (VITAMIN D) 25 MCG (1000 UNIT) tablet Take 1,000 Units by mouth daily.    . clonazePAM (KLONOPIN) 0.5 MG tablet TAKE 0.5-1 TABLETS (0.25-0.5 MG TOTAL) BY MOUTH DAILY AS NEEDED (FOR ANXIETY OR SLEEP.). 90 tablet 1  . Cyanocobalamin (VITAMIN B-12 IJ) Inject 1,000 mcg as directed every 30 (thirty) days.    [name] FLUoxetine (PROZAC) 20 MG capsule Take 1 capsule (20 mg total) by mouth daily. 90 capsule 3  . fluticasone (FLONASE) 50 MCG/ACT nasal spray Place 1-2 sprays into both nostrils daily as needed for allergies or rhinitis.    . furosemide (LASIX) 20 MG tablet Take 1 tablet (20 mg total) by mouth as needed for fluid. 90 tablet 3  . Glycerin-Polysorbate 80 (REFRESH DRY EYE THERAPY OP) Place 1 drop into both eyes daily.    [name] ibuprofen (ADVIL) 200 MG tablet Take 400-800 mg by mouth every 8 (eight) hours as needed (pain.).    [name] mesalamine (PENTASA) 250 MG CR capsule Take 250-500 mg by mouth 4 (four) times daily as needed (crohn's disease flare ups).     . Multiple Vitamins-Minerals (ICAPS AREDS 2 PO) Take 1 tablet by mouth daily.    . prednisoLONE acetate (PRED FORTE) 1 % ophthalmic suspension Place 1 drop into both eyes 2 (two) times daily as needed (Uveitis).    . Probiotic Product (PROBIOTIC PO) Take 1 capsule by mouth 2 (two) times a week. As needed     No current facility-administered medications for this visit.    Allergies:   Victoza [liraglutide], Lasix [furosemide], Metformin and related, Onion, Oxycodone, Potassium-containing compounds, Prednisone, and Sulfa drugs cross reactors    Social History:  The patient  reports that she quit smoking about 12 years ago. Her smoking use included cigarettes. She has a 45.00 pack-year smoking history. She has never used smokeless tobacco. She  reports current alcohol use. She reports that she does not use drugs.   Family History:  The patient's family history includes Anxiety disorder in her father; COPD in her brother; Diabetes Mellitus II in her brother and father; Glaucoma in her brother and mother; Heart disease in her brother; High Cholesterol in her mother; High blood pressure in her mother; Non-Hodgkin's lymphoma in her brother; Sleep apnea in her father and mother.    ROS:  Please see the history of present illness.   Otherwise, review of systems are positive for none. All other systems are reviewed and negative.    PHYSICAL EXAM: VS:  BP 114/72   Pulse 82   Ht _0  (1.626 m)   Wt 257 lb (116.6 kg)   SpO2 96%   BMI 44.11 kg/m  , BMI Body mass index is 44.11 kg/m.   General: Well developed, well nourished, NAD Neck: Negative for carotid bruits. No JVD Lungs:Clear to ausculation bilaterally. No wheezes, rales, or rhonchi. Breathing is unlabored. Cardiovascular: RRR with S1 S2. No murmurs Extremities: No edema. Neuro: Alert and oriented. No focal deficits. No facial asymmetry. MAE spontaneously. Psych: Responds to questions appropriately with normal affect.     EKG:  EKG is  not ordered today.  Recent Labs: 06/23/2020: Pro B Natriuretic peptide (BNP) 34.0 09/09/2020: ALT 17; Brain Natriuretic Peptide 18; TSH 0.58 12/04/2020: BUN 23; Creatinine, Ser 0.91; Platelets 316 12/07/2020: Hemoglobin 13.6; Potassium 3.9; Sodium 142    Lipid Panel    Component Value Date/Time   CHOL 213 (H) 04/29/2020 1438   CHOL 178 01/21/2019 1321   TRIG 143.0 04/29/2020 1438   HDL 56.30 04/29/2020 1438   HDL 61 01/21/2019 1321   CHOLHDL 4 04/29/2020 1438   VLDL 28.6 04/29/2020 1438   LDLCALC 128 (H) 04/29/2020 1438   LDLCALC 102 (H) 01/21/2019 1321    Wt Readings from Last 3 Encounters:  01/13/21 257 lb (116.6 kg)  12/11/20 258 lb 9.6 oz (117.3 kg)  12/07/20 255 lb (115.7 kg)    Other studies Reviewed: Additional studies/ records that were reviewed today include: Review of the above records demonstrates:   [place] 12/07/20:  RA 13/12 mean 9 mmHg RV 48/8, EDP 12 PA 50/29 mean 38 PCWP 22/20 mean 20  O2 saturations: PA 62, Ao 97, SVC 64  CO 4.26 L/m, CI 1.96 Trans-pulmonic gradient 18 mmHg PVR 4.2 Woods Units   Stress test 10/21/20:   The left ventricular ejection fraction is normal (55-65%).  Nuclear stress EF: 63%.  There was no ST segment deviation noted during stress.  There is diaphragmatic attenuation most notable on rest imaging.  Normal perfusion and rest and with stress. No evidence of ischemia or infarction.  This is a low risk study    ASSESSMENT AND PLAN:  1. Dyspnea: -No obvious cardiac etiology with normal LVEF.  -No ischemia on recent stress test.  -Referred to CV for RHC which showed moderate pulmonary HTN -Follows with pulmonary the end of the month for repeat PFTs    2. Chronic diastolic CHF: -Echo 08/3789 showed LVEF 60-65% with G1DD -Self discontinued Lasix due to severe Crohns s/e>>change to PRN doing -Euvolemic on exam  3. Hyperlipidemia: -LDL 128 from last panel -Discussed adding Lipitor however very reluctant. Will  have repeat labs with PCP 02/01/21.  -Follow to possible add   4. Pre-diabetes: -Last A1C 6.3 in 08/2020 -Continue with diet and lifestyle changes as above, followed by PCP  5. OSA -Continue CPAP with 2L  at night -Followed by Pulmonology   Current medicines are reviewed at length with the patient today.  The patient does not have concerns regarding medicines.  The following changes have been made:  Lasix 9m PRN  Labs/ tests ordered today include: None  No orders of the defined types were placed in this encounter.   Disposition:   FU with myself  in 9 months  Signed, JKathyrn Drown NP  01/13/2021 3:01 PM    [facility] [address] [place]  [id number]: [phone] Fax: [phone]

## 2021-01-12 NOTE — Telephone Encounter (Signed)
Called and spoke with patient. I explained that due to Max, we are going back to having patient scheduled for COVID tests before breathing tests. She has been scheduled for 01/16/21 at 1210pm. She is aware of the time and location.   Nothing further needed at time of call.

## 2021-01-13 ENCOUNTER — Telehealth (HOSPITAL_COMMUNITY): Payer: Self-pay | Admitting: *Deleted

## 2021-01-13 ENCOUNTER — Other Ambulatory Visit: Payer: Self-pay

## 2021-01-13 ENCOUNTER — Ambulatory Visit (INDEPENDENT_AMBULATORY_CARE_PROVIDER_SITE_OTHER): Payer: Medicare Other | Admitting: Cardiology

## 2021-01-13 ENCOUNTER — Encounter: Payer: Self-pay | Admitting: Cardiology

## 2021-01-13 VITALS — BP 114/72 | HR 82 | Ht 64.0 in | Wt 257.0 lb

## 2021-01-13 DIAGNOSIS — I5032 Chronic diastolic (congestive) heart failure: Secondary | ICD-10-CM | POA: Diagnosis not present

## 2021-01-13 DIAGNOSIS — I272 Pulmonary hypertension, unspecified: Secondary | ICD-10-CM

## 2021-01-13 DIAGNOSIS — G4733 Obstructive sleep apnea (adult) (pediatric): Secondary | ICD-10-CM

## 2021-01-13 DIAGNOSIS — R0609 Other forms of dyspnea: Secondary | ICD-10-CM

## 2021-01-13 DIAGNOSIS — E785 Hyperlipidemia, unspecified: Secondary | ICD-10-CM | POA: Diagnosis not present

## 2021-01-13 DIAGNOSIS — R7303 Prediabetes: Secondary | ICD-10-CM

## 2021-01-13 DIAGNOSIS — R0600 Dyspnea, unspecified: Secondary | ICD-10-CM

## 2021-01-13 DIAGNOSIS — J849 Interstitial pulmonary disease, unspecified: Secondary | ICD-10-CM

## 2021-01-13 DIAGNOSIS — Z9989 Dependence on other enabling machines and devices: Secondary | ICD-10-CM | POA: Diagnosis not present

## 2021-01-13 MED ORDER — FUROSEMIDE 20 MG PO TABS: 20.0000 mg | ORAL_TABLET | ORAL | 3 refills | Status: DC | PRN

## 2021-01-13 NOTE — Patient Instructions (Signed)
Medication Instructions:  Your physician has recommended you make the following change in your medication:  1. DECREASE LASIX TO 20 MG AS NEEDED FOR FLUID RETENTION.   *If you need a refill on your cardiac medications before your next appointment, please call your pharmacy*   Lab Work: NONE If you have labs (blood work) drawn today and your tests are completely normal, you will receive your results only by: [name] MyChart Message (if you have MyChart) OR . A paper copy in the mail If you have any lab test that is abnormal or we need to change your treatment, we will call you to review the results.   Testing/Procedures: NONE   Follow-Up: At [facility], you and your health needs are our priority.  As part of our continuing mission to provide you with exceptional heart care, we have created designated Provider Care Teams.  These Care Teams include your primary Cardiologist (physician) and Advanced Practice Providers (APPs -  Physician Assistants and Nurse Practitioners) who all work together to provide you with the care you need, when you need it.  We recommend signing up for the patient portal called "MyChart".  Sign up information is provided on this After Visit Summary.  MyChart is used to connect with patients for Virtual Visits (Telemedicine).  Patients are able to view lab/test results, encounter notes, upcoming appointments, etc.  Non-urgent messages can be sent to your provider as well.   To learn more about what you can do with MyChart, go to [email or web address].    Your next appointment:   9 month(s)  The format for your next appointment:   In Person  Provider:   Kathyrn Drown, NP

## 2021-01-14 ENCOUNTER — Encounter (HOSPITAL_COMMUNITY): Payer: Self-pay

## 2021-01-16 ENCOUNTER — Inpatient Hospital Stay (HOSPITAL_COMMUNITY): Admission: RE | Admit: 2021-01-16 | Payer: Medicare Other | Source: Ambulatory Visit

## 2021-01-18 ENCOUNTER — Telehealth: Payer: Self-pay | Admitting: Internal Medicine

## 2021-01-18 NOTE — Telephone Encounter (Signed)
Spoke with patient. She was calling to see if there was anyway to have her PFT scheduled for next week as well having her OV with MR. I did look at the schedule to see if this was possible but MR did not have any openings. She is scheduled for surgery at the beginning of February. She is aware of this and will keep her PFT appt for 02/28 and OV on 03/01.

## 2021-01-25 ENCOUNTER — Ambulatory Visit: Payer: Medicare Other | Admitting: Internal Medicine

## 2021-01-26 ENCOUNTER — Other Ambulatory Visit (HOSPITAL_COMMUNITY): Payer: Medicare Other

## 2021-01-28 ENCOUNTER — Other Ambulatory Visit: Payer: Self-pay

## 2021-01-28 ENCOUNTER — Encounter (HOSPITAL_COMMUNITY): Payer: Self-pay

## 2021-01-28 ENCOUNTER — Encounter (HOSPITAL_COMMUNITY)
Admission: RE | Admit: 2021-01-28 | Discharge: 2021-01-28 | Disposition: A | Discharge status: Home / Self Care | Payer: Medicare Other | Source: Ambulatory Visit | Attending: Surgery | Admitting: Surgery

## 2021-01-28 DIAGNOSIS — Z9989 Dependence on other enabling machines and devices: Secondary | ICD-10-CM | POA: Diagnosis not present

## 2021-01-28 DIAGNOSIS — I272 Pulmonary hypertension, unspecified: Secondary | ICD-10-CM | POA: Insufficient documentation

## 2021-01-28 DIAGNOSIS — G4733 Obstructive sleep apnea (adult) (pediatric): Secondary | ICD-10-CM | POA: Insufficient documentation

## 2021-01-28 DIAGNOSIS — K5090 Crohn's disease, unspecified, without complications: Secondary | ICD-10-CM | POA: Insufficient documentation

## 2021-01-28 DIAGNOSIS — Z79899 Other long term (current) drug therapy: Secondary | ICD-10-CM | POA: Diagnosis not present

## 2021-01-28 DIAGNOSIS — Z01812 Encounter for preprocedural laboratory examination: Secondary | ICD-10-CM | POA: Insufficient documentation

## 2021-01-28 DIAGNOSIS — Z9981 Dependence on supplemental oxygen: Secondary | ICD-10-CM | POA: Diagnosis not present

## 2021-01-28 DIAGNOSIS — I5032 Chronic diastolic (congestive) heart failure: Secondary | ICD-10-CM | POA: Diagnosis not present

## 2021-01-28 DIAGNOSIS — J982 Interstitial emphysema: Secondary | ICD-10-CM | POA: Diagnosis not present

## 2021-01-28 HISTORY — DX: Dyspnea, unspecified: R06.00

## 2021-01-28 HISTORY — DX: Unspecified osteoarthritis, unspecified site: M19.90

## 2021-01-28 HISTORY — DX: Anxiety disorder, unspecified: F41.9

## 2021-01-28 LAB — CBC
HCT: 42.8 % (ref 36.0–46.0)
Hemoglobin: 14.4 g/dL (ref 12.0–15.0)
MCH: 30.9 pg (ref 26.0–34.0)
MCHC: 33.6 g/dL (ref 30.0–36.0)
MCV: 91.8 fL (ref 80.0–100.0)
Platelets: 299 10*3/uL (ref 150–400)
RBC: 4.66 MIL/uL (ref 3.87–5.11)
RDW: 14.5 % (ref 11.5–15.5)
WBC: 7.5 10*3/uL (ref 4.0–10.5)
nRBC: 0 % (ref 0.0–0.2)

## 2021-01-28 LAB — BASIC METABOLIC PANEL
Anion gap: 8 (ref 5–15)
BUN: 19 mg/dL (ref 8–23)
CO2: 27 mmol/L (ref 22–32)
Calcium: 9.1 mg/dL (ref 8.9–10.3)
Chloride: 103 mmol/L (ref 98–111)
Creatinine, Ser: 0.76 mg/dL (ref 0.44–1.00)
GFR, Estimated: >60 mL/min (ref >60)
Glucose, Bld: 142 mg/dL — ABNORMAL HIGH (ref 70–99)
Potassium: 4 mmol/L (ref 3.5–5.1)
Sodium: 138 mmol/L (ref 135–145)

## 2021-01-28 LAB — GLUCOSE, CAPILLARY: Glucose-Capillary: 123 mg/dL — ABNORMAL HIGH (ref 70–99)

## 2021-01-28 NOTE — Pre-Procedure Instructions (Signed)
Patty [name] [facility]  01/28/2021      [id number] [id number]: [phone]: [phone]   Your procedure is scheduled on 02/04/21.  [facility]  Call this number if you have problems the morning of surgery:  [phone]  Remember:  Do not eat or drink after midnight.  You may drink clear liquids until 11AM .  Clear liquids allowed are:                    Juice (non-citric and without pulp - diabetics please choose diet or no sugar options), Carbonated beverages - (diabetics please choose diet or no sugar options), Clear Tea and Black Coffee only (no creamer, milk or cream including half and half)    Take these medicines the morning of surgery with A SIP OF WATER ----PROZAC,PENTASA,ZYRTEC    Do not wear jewelry, make-up or nail polish.  Do not wear lotions, powders, or perfumes, or deodorant.  Do not shave 48 hours prior to surgery.  Men may shave face and neck.  Do not bring valuables to the hospital.  [facility] any belongings or val[facility]ables.  Contacts, dentures or bridgework may not be worn into surgery.  Leave your suitcase in the car.  After surgery it may be brought to your room.  For patients admitted to the hospital, discharge time will be determined by your treatment team.  Patients discharged the day of surgery will not be allowed to drive home.   Do not take any aspirin,anti-inflammatories,vitamins,or herbal supplements 5-7 days prior to surgery.[place] - Preparing for Surgery  Before surgery, you can play an important role.  Because skin is not sterile, your skin needs to be as free of germs as possible.  You can reduce the number of germs on you skin by washing with CHG (chlorahexidine gluconate) soap before surgery.  CHG is an antiseptic cleaner which kills germs and bonds with the skin to continue killing germs  even after washing.  Oral Hygiene is also important in reducing the risk of infection.  Remember to brush your teeth with your regular toothpaste the morning of surgery.  Please DO NOT use if you have an allergy to CHG or antibacterial soaps.  If your skin becomes reddened/irritated stop using the CHG and inform your nurse when you arrive at Short Stay.  Do not shave (including legs and underarms) for at least 48 hours prior to the first CHG shower.  You may shave your face.  Please follow these instructions carefully:   1.  Shower with CHG Soap the night before surgery and the morning of Surgery.  2.  If you choose to wash your hair, wash your hair first as usual with your normal shampoo.  3.  After you shampoo, rinse your hair and body thoroughly to remove the shampoo. 4.  Use CHG as you would any other liquid soap.  You can apply chg directly to the skin and wash gently with a      scrungie or washcloth.           5.  Apply the CHG Soap to your body ONLY FROM THE NECK DOWN.   Do not use on open wounds or open sores. Avoid contact with your eyes, ears, mouth and genitals (private parts).  Wash genitals (private parts) with your normal soap.  6.  Wash thoroughly, paying special attention to the area where your surgery will be performed.  7.  Thoroughly rinse your body with warm water from the neck down.  8.  DO NOT shower/wash with your normal soap after using and rinsing off the CHG Soap.  9.  Pat yourself dry with a clean towel.            10.  Wear clean pajamas.            11.  Place clean sheets on your bed the night of your first shower and do not sleep with pets.  Day of Surgery  Do not apply any lotions/deoderants the morning of surgery.   Please wear clean clothes to the hospital/surgery center. Remember to brush your teeth with toothpaste.     Please read over the following fact sheets that you were given. Coughing and Deep Breathing

## 2021-01-28 NOTE — Progress Notes (Signed)
PCP - DR Jonni Sanger WITH [facility]      ALSO  PULMONARY [place] Cardiologist - DR Melene Muller     NOT SEEN RECENTLY  -   Chest x-ray - CT CHEST  7/21 EKG - 12/21 Stress Test - 10/21 ECHO - 7/21 Cardiac Cath - 12/21  Sleep Study - YES CPAP - YES   ALSO O2 AT NIGHT    [facility] -INSTRUCTIONS  COVID TEST- 02/01/21   Anesthesia review: PUL. HTN      CPAP     ILD  Patient denies shortness of breath, fever, cough and chest pain at PAT appointment   All instructions explained to the patient, with a verbal understanding of the material. Patient agrees to go over the instructions while at home for a better understanding. Patient also instructed to self quarantine after being tested for COVID-19. The opportunity to ask questions was provided.

## 2021-01-29 ENCOUNTER — Ambulatory Visit (HOSPITAL_COMMUNITY): Admission: RE | Admit: 2021-01-29 | Payer: Medicare Other | Source: Home / Self Care | Admitting: Surgery

## 2021-01-29 NOTE — Progress Notes (Signed)
Anesthesia Chart Review:  Follows with pulmonology for history of interstitial lung disease and emphysema.  Last seen 12/11/2020.  Per note, CT chest showed no significant change over the past 3 years.  PFTs did show slight decrease in her DLCO.  Suspected that her dyspnea is multifactorial with underlying interstitial lung disease, emphysema, diastolic function, and pulmonary hypertension.  She is maintained on 3 L supplemental oxygen with activity with a goal of keeping O2 sats >88-90%.  She is also on CPAP with oxygen at night for treatment of OSA.  She has been referred to participate in pulmonary rehabilitation program to improve her conditioning.  Patient has also recently undergone cardiac evaluation of her DOE.  Echo7/23/21 showedLVEF 60-65% with no WMA, G1DD.Myoview stress test 10/22/20 showed no ischemia or infarct.  [place] 12/07/2020 indicated moderate pulmonary hypertension.   She was last seen by cardiology 01/13/2021 and it was felt there was no obvious cardiac etiology for her dyspnea given normal LVEF and no ischemia on stress test.  Her Lasix was changed to as needed dosing due to severe Crohn's side effects with daily use.  She was advised to follow-up with cardiology in 9 months.  Preop labs reviewed, unremarkable.  EKG 11/25/2020: Sinus rhythm.  Rate 75.  Incomplete right bundle branch block.  CT chest 07/10/2020: IMPRESSION: 1. Spectrum of findings suggestive of a mild basilar predominant fibrotic interstitial lung disease without appreciable interval progression since baseline 05/12/2017 high-resolution chest CT. Favor NSIP, with UIP not excluded. Findings are indeterminate for UIP per consensus guidelines: Diagnosis of Idiopathic Pulmonary Fibrosis: An Official ATS/ERS/JRS/ALAT Clinical Practice Guideline. [place], Iss 5, [phone], Aug 26 2017. 2. Dilated main pulmonary artery, stable, suggesting chronic pulmonary arterial hypertension. 3. Aortic  Atherosclerosis (ICD10-I70.0).  PFT 06/09/2020: FVC-%Pred-Pre Latest Units: % 82  FEV1-%Pred-Pre Latest Units: % 68  FEV1FVC-%Pred-Pre Latest Units: % 82  DLCO unc % pred Latest Units: % 70    RHC 12/07/20: RA 13/12 mean 9 mmHg RV 48/8, EDP 12 PA 50/29 mean 38 PCWP 22/20 mean 20  O2 saturations: PA 62, Ao 97, SVC 64  CO 4.26 L/m, CI 1.96 Trans-pulmonic gradient 18 mmHg PVR 4.2 Woods Units  Stress test 10/21/20:   The left ventricular ejection fraction is normal (55-65%).  Nuclear stress EF: 63%.  There was no ST segment deviation noted during stress.  There is diaphragmatic attenuation most notable on rest imaging.  Normal perfusion and rest and with stress. No evidence of ischemia or infarction.  This is a low risk study  TTE 07/17/2020: 1. Left ventricular ejection fraction, by estimation, is 60 to 65%. The  left ventricle has normal function. The left ventricle has no regional  wall motion abnormalities. There is moderate concentric left ventricular  hypertrophy. Left ventricular  diastolic parameters are consistent with Grade I diastolic dysfunction  (impaired relaxation).  2. Although TAPSE is normal, visually RVF appears reduced. The right  ventricular size is normal.  3. The mitral valve is normal in structure. No evidence of mitral valve  regurgitation. No evidence of mitral stenosis.  4. The aortic valve is normal in structure. Aortic valve regurgitation is  not visualized. No aortic stenosis is present.  5. The inferior vena cava is normal in size with greater than 50%  respiratory variability, suggesting right atrial pressure of 3 mmHg.    Wynonia Musty [facility] Short Stay Center/Anesthesiology Phone [phone] 01/29/2021 2:01 PM

## 2021-01-29 NOTE — Anesthesia Preprocedure Evaluation (Addendum)
Anesthesia Evaluation  Patient identified by MRN, date of birth, ID band Patient awake    Reviewed: Allergy & Precautions, H&P , NPO status , Patient's Chart, lab work & pertinent test results  Airway Mallampati: II   Neck ROM: full    Dental   Pulmonary shortness of breath, sleep apnea , former smoker,  Interstitial lung disease   breath sounds clear to auscultation       Cardiovascular negative cardio ROS   Rhythm:regular Rate:Normal     Neuro/Psych PSYCHIATRIC DISORDERS Anxiety    GI/Hepatic Crohn's dz   Endo/Other  Morbid obesity  Renal/GU      Musculoskeletal  (+) Arthritis ,   Abdominal   Peds  Hematology   Anesthesia Other Findings   Reproductive/Obstetrics                           Anesthesia Physical Anesthesia Plan  ASA: III  Anesthesia Plan: General   Post-op Pain Management:    Induction: Intravenous  PONV Risk Score and Plan: 3 and Ondansetron, Dexamethasone and Treatment may vary due to age or medical condition  Airway Management Planned: LMA  Additional Equipment:   Intra-op Plan:   Post-operative Plan: Extubation in OR  Informed Consent: I have reviewed the patients History and Physical, chart, labs and discussed the procedure including the risks, benefits and alternatives for the proposed anesthesia with the patient or authorized representative who has indicated his/her understanding and acceptance.     Dental advisory given  Plan Discussed with: CRNA, Anesthesiologist and Surgeon  Anesthesia Plan Comments: (PAT note by Karoline Caldwell, PA-C: Follows with pulmonology for history of interstitial lung disease and emphysema.  Last seen 12/11/2020.  Per note, CT chest showed no significant change over the past 3 years.  PFTs did show slight decrease in her DLCO.  Suspected that her dyspnea is multifactorial with underlying interstitial lung disease, emphysema,  diastolic function, and pulmonary hypertension.  She is maintained on 3 L supplemental oxygen with activity with a goal of keeping O2 sats >88-90%.  She is also on CPAP with oxygen at night for treatment of OSA.  She has been referred to participate in pulmonary rehabilitation program to improve her conditioning.  Patient has also recently undergone cardiac evaluation of her DOE.  Echo7/23/21 showedLVEF 60-65% with no WMA, G1DD.Myoview stress test 10/22/20 showed no ischemia or infarct.  [place] 12/07/2020 indicated moderate pulmonary hypertension.   She was last seen by cardiology 01/13/2021 and it was felt there was no obvious cardiac etiology for her dyspnea given normal LVEF and no ischemia on stress test.  Her Lasix was changed to as needed dosing due to severe Crohn's side effects with daily use.  She was advised to follow-up with cardiology in 9 months.  Preop labs reviewed, unremarkable.  EKG 11/25/2020: Sinus rhythm.  Rate 75.  Incomplete right bundle branch block.  CT chest 07/10/2020: IMPRESSION: 1. Spectrum of findings suggestive of a mild basilar predominant fibrotic interstitial lung disease without appreciable interval progression since baseline 05/12/2017 high-resolution chest CT. Favor NSIP, with UIP not excluded. Findings are indeterminate for UIP per consensus guidelines: Diagnosis of Idiopathic Pulmonary Fibrosis: An Official ATS/ERS/JRS/ALAT Clinical Practice Guideline. [place], Iss 5, [id number], Aug 26 2017. 2. Dilated main pulmonary artery, stable, suggesting chronic pulmonary arterial hypertension. 3. Aortic Atherosclerosis (ICD10-I70.0).  PFT 06/09/2020: FVC-%Pred-Pre Latest Units: % 82 FEV1-%Pred-Pre Latest Units: % 68 FEV1FVC-%Pred-Pre Latest Units: %  82 DLCO unc % pred Latest Units: % 70   RHC 12/07/20: RA 13/12 mean 9 mmHg RV 48/8, EDP 12 PA 50/29 mean 38 PCWP 22/20 mean 20  O2 saturations: PA 62, Ao 97, SVC 64  CO 4.26 L/m, CI  1.96 Trans-pulmonic gradient 18 mmHg PVR 4.2 [place] Units  Stress test 10/21/20:  The left ventricular ejection fraction is normal (55-65%). Nuclear stress EF: 63%. There was no ST segment deviation noted during stress. There is diaphragmatic attenuation most notable on rest imaging. Normal perfusion and rest and with stress. No evidence of ischemia or infarction. This is a low risk study  TTE 07/17/2020: 1. Left ventricular ejection fraction, by estimation, is 60 to 65%. The  left ventricle has normal function. The left ventricle has no regional  wall motion abnormalities. There is moderate concentric left ventricular  hypertrophy. Left ventricular  diastolic parameters are consistent with Grade I diastolic dysfunction  (impaired relaxation).  2. Although TAPSE is normal, visually RVF appears reduced. The right  ventricular size is normal.  3. The mitral valve is normal in structure. No evidence of mitral valve  regurgitation. No evidence of mitral stenosis.  4. The aortic valve is normal in structure. Aortic valve regurgitation is  not visualized. No aortic stenosis is present.  5. The inferior vena cava is normal in size with greater than 50%  respiratory variability, suggesting right atrial pressure of 3 mmHg.   )       Anesthesia Quick Evaluation

## 2021-02-01 ENCOUNTER — Other Ambulatory Visit (HOSPITAL_COMMUNITY)
Admission: RE | Admit: 2021-02-01 | Discharge: 2021-02-01 | Disposition: A | Discharge status: Home / Self Care | Payer: Medicare Other | Source: Ambulatory Visit | Attending: Surgery | Admitting: Surgery

## 2021-02-01 ENCOUNTER — Ambulatory Visit (INDEPENDENT_AMBULATORY_CARE_PROVIDER_SITE_OTHER): Payer: Medicare Other | Admitting: Family Medicine

## 2021-02-01 ENCOUNTER — Encounter: Payer: Self-pay | Admitting: Family Medicine

## 2021-02-01 ENCOUNTER — Other Ambulatory Visit: Payer: Self-pay

## 2021-02-01 VITALS — BP 122/78 | HR 64 | Temp 97.1°F | Ht 64.0 in | Wt 255.0 lb

## 2021-02-01 DIAGNOSIS — Z9989 Dependence on other enabling machines and devices: Secondary | ICD-10-CM

## 2021-02-01 DIAGNOSIS — J849 Interstitial pulmonary disease, unspecified: Secondary | ICD-10-CM | POA: Diagnosis not present

## 2021-02-01 DIAGNOSIS — F4322 Adjustment disorder with anxiety: Secondary | ICD-10-CM

## 2021-02-01 DIAGNOSIS — E782 Mixed hyperlipidemia: Secondary | ICD-10-CM

## 2021-02-01 DIAGNOSIS — R7303 Prediabetes: Secondary | ICD-10-CM | POA: Diagnosis not present

## 2021-02-01 DIAGNOSIS — G4733 Obstructive sleep apnea (adult) (pediatric): Secondary | ICD-10-CM

## 2021-02-01 DIAGNOSIS — I272 Pulmonary hypertension, unspecified: Secondary | ICD-10-CM

## 2021-02-01 DIAGNOSIS — Z20822 Contact with and (suspected) exposure to covid-19: Secondary | ICD-10-CM | POA: Diagnosis not present

## 2021-02-01 DIAGNOSIS — K50919 Crohn's disease, unspecified, with unspecified complications: Secondary | ICD-10-CM

## 2021-02-01 DIAGNOSIS — Z01812 Encounter for preprocedural laboratory examination: Secondary | ICD-10-CM | POA: Insufficient documentation

## 2021-02-01 LAB — CBC WITH DIFFERENTIAL/PLATELET
Basophils Absolute: 0 10*3/uL (ref 0.0–0.1)
Basophils Relative: 0.6 % (ref 0.0–3.0)
Eosinophils Absolute: 0.1 10*3/uL (ref 0.0–0.7)
Eosinophils Relative: 2.1 % (ref 0.0–5.0)
HCT: 43.1 % (ref 36.0–46.0)
Hemoglobin: 14.4 g/dL (ref 12.0–15.0)
Lymphocytes Relative: 25.9 % (ref 12.0–46.0)
Lymphs Abs: 1.6 10*3/uL (ref 0.7–4.0)
MCHC: 33.4 g/dL (ref 30.0–36.0)
MCV: 89.7 fl (ref 78.0–100.0)
Monocytes Absolute: 0.5 10*3/uL (ref 0.1–1.0)
Monocytes Relative: 8.7 % (ref 3.0–12.0)
Neutro Abs: 3.9 10*3/uL (ref 1.4–7.7)
Neutrophils Relative %: 62.7 % (ref 43.0–77.0)
Platelets: 280 10*3/uL (ref 150.0–400.0)
RBC: 4.8 Mil/uL (ref 3.87–5.11)
RDW: 14.9 % (ref 11.5–15.5)
WBC: 6.3 10*3/uL (ref 4.0–10.5)

## 2021-02-01 LAB — COMPREHENSIVE METABOLIC PANEL
ALT: 16 U/L (ref 0–35)
AST: 16 U/L (ref 0–37)
Albumin: 4.1 g/dL (ref 3.5–5.2)
Alkaline Phosphatase: 64 U/L (ref 39–117)
BUN: 17 mg/dL (ref 6–23)
CO2: 30 mEq/L (ref 19–32)
Calcium: 9.6 mg/dL (ref 8.4–10.5)
Chloride: 103 mEq/L (ref 96–112)
Creatinine, Ser: 0.81 mg/dL (ref 0.40–1.20)
GFR: 73.55 mL/min (ref >60.00)
Glucose, Bld: 104 mg/dL — ABNORMAL HIGH (ref 70–99)
Potassium: 4.5 mEq/L (ref 3.5–5.1)
Sodium: 137 mEq/L (ref 135–145)
Total Bilirubin: 0.6 mg/dL (ref 0.2–1.2)
Total Protein: 7.3 g/dL (ref 6.0–8.3)

## 2021-02-01 LAB — TSH: TSH: 0.45 u[IU]/mL (ref 0.35–4.50)

## 2021-02-01 LAB — LIPID PANEL
Cholesterol: 190 mg/dL (ref 0–200)
HDL: 55.6 mg/dL (ref >39.00)
LDL Cholesterol: 120 mg/dL — ABNORMAL HIGH (ref 0–99)
NonHDL: 134.54
Total CHOL/HDL Ratio: 3
Triglycerides: 75 mg/dL (ref 0.0–149.0)
VLDL: 15 mg/dL (ref 0.0–40.0)

## 2021-02-01 LAB — HEMOGLOBIN A1C: Hgb A1c MFr Bld: 6.4 % (ref 4.6–6.5)

## 2021-02-01 MED ORDER — SHINGRIX 50 MCG/0.5ML IM SUSR: 0.5000 mL | Freq: Once | INTRAMUSCULAR | 0 refills | Status: AC

## 2021-02-01 NOTE — Progress Notes (Signed)
Subjective  CC:  Chief Complaint  Patient presents with  . Follow-up    HPI: Patty [name] is a 70 y.o. female who presents to the office today for follow up of diabetes and problems listed above in the chief complaint.  I reviewed recent pulmonology and cardiology notes.  She has had thorough cardiology work-up including a right heart catheterization that showed pulmonary hypertension.  Stress Myoview cardiac test was negative for ischemia.  She has normal EF diastolic dysfunction.  She is euvolemic using as needed Lasix.  Her blood pressure is well controlled.  She is feeling better overall.  She is on medications for pulmonary hypertension and interstitial lung disease and using overnight oxygen.  We will transition her continuous oxygen for activity levels.  She also will undergo pulmonary rehab.  Overall she feels stable.  Prediabetes follow up: She has been eating well.  Tries to avoid carbs and sweets.  Weight is stable.  She struggles to lose weight.  She has been on Victoza in the past and had GI side effects.  Also cannot tolerate Metformin.  Hyperlipidemia: She is very hesitant to start a statin.  Fortunately she has normal coronaries by heart catheterization.  Fasting lipid panel to be checked today.  Crohn's disease is currently well controlled.  She does see GI and is on medications for this.  Her mood is well maintained on Prozac daily.  She continues to use CPAP for her obstructive sleep apnea. Wt Readings from Last 3 Encounters:  02/01/21 255 lb (115.7 kg)  01/28/21 255 lb 11.2 oz (116 kg)  01/13/21 257 lb (116.6 kg)    BP Readings from Last 3 Encounters:  02/01/21 122/78  01/28/21 132/76  01/13/21 114/72    Assessment  1. ILD (interstitial lung disease) ([place])   2. Pulmonary hypertension, unspecified ([place])   3. Morbid obesity ([place])   4. Prediabetes   5. Mixed hyperlipidemia   6. Crohn's disease with complication, unspecified gastrointestinal tract  location ([place])   7. Adjustment disorder with anxiety   8. OSA on CPAP      Plan   Prediabetes: We will recheck today.  Continue healthy diet.  She is working to increase her exercise level  Pulmonary disease: ILD and pulmonary hypertension on oxygen.  Crohn's disease, mood disorder and sleep apnea are all currently well controlled.  Check fasting lipids to help her know if a statin indicated.  She prefers to avoid this if possible.  Screens are up-to-date.  She is scheduled for lumpectomy later this week.  Takes vitamin B12 injections for pernicious anemia  Offer Shingrix.  Patient to think about it  Follow up: 6 months for recheck. Orders Placed This Encounter  Procedures  . Lipid panel  . CBC with Differential/Platelet  . Comprehensive metabolic panel  . TSH  . Hemoglobin A1c   Meds ordered this encounter  Medications  . Zoster Vaccine Adjuvanted [facility]) injection    Sig: Inject 0.5 mLs into the muscle once for 1 dose. Please give 2nd dose 2-6 months after first dose    Dispense:  2 each    Refill:  0      Immunization History  Administered Date(s) Administered  . Fluad Quad(high Dose 65+) 10/07/2019, 09/09/2020  . Influenza, High Dose Seasonal PF 08/28/2018  . Influenza-Unspecified 11/23/2017  . PFIZER(Purple Top)SARS-COV-2 Vaccination 02/16/2020, 03/17/2020, 12/14/2020  . Pneumococcal Conjugate-13 02/13/2018  . Pneumococcal Polysaccharide-23 03/12/2019    Diabetes Related Lab Review: Lab Results  Component  Value Date   HGBA1C 6.3 (H) 09/09/2020   HGBA1C 6.1 (A) 04/29/2020   HGBA1C 6.2 (H) 01/16/2020    No results found for: Derl Barrow Lab Results  Component Value Date   CREATININE 0.76 01/28/2021   BUN 19 01/28/2021   NA 138 01/28/2021   K 4.0 01/28/2021   CL 103 01/28/2021   CO2 27 01/28/2021   Lab Results  Component Value Date   CHOL 213 (H) 04/29/2020   CHOL 178 01/21/2019   CHOL 165 08/07/2018   Lab Results  Component  Value Date   HDL 56.30 04/29/2020   HDL 61 01/21/2019   HDL 53 08/07/2018   Lab Results  Component Value Date   LDLCALC 128 (H) 04/29/2020   LDLCALC 102 (H) 01/21/2019   LDLCALC 98 08/07/2018   Lab Results  Component Value Date   TRIG 143.0 04/29/2020   TRIG 74 01/21/2019   TRIG 70 08/07/2018   Lab Results  Component Value Date   CHOLHDL 4 04/29/2020   No results found for: LDLDIRECT The 10-year ASCVD risk score Mikey Bussing DC Jr., et al., 2013) is: 21.4%   Values used to calculate the score:     Age: 12 years     Sex: Female     Is Non-Hispanic African American: No     Diabetic: Yes     Tobacco smoker: No     Systolic Blood Pressure: 301 mmHg     Is BP treated: Yes     HDL Cholesterol: 56.3 mg/dL     Total Cholesterol: 213 mg/dL I have reviewed the [place], Fam and Soc history. Patient Active Problem List   Diagnosis Date Noted  . Pulmonary hypertension, unspecified ([place]) 12/07/2020    Priority: High  . Sleep disorder 01/10/2020    Priority: High    Uses klonopin for sleep since 2014; sleep walking. And anxiety   . Pernicious anemia 01/10/2020    Priority: High  . Chronic prescription benzodiazepine use 01/10/2020    Priority: High  . Morbid obesity ([place]) 09/24/2019    Priority: High  . History of DVT (deep vein thrombosis), with PE 07/25/2019    Priority: High  . OSA on CPAP     Priority: High  . Crohn disease ([place]) 06/15/2017    Priority: High  . ILD (interstitial lung disease) ([place]) 05/29/2017    Priority: High  . Osteopenia 08/05/2020    Priority: Medium    H/o pred use Dexa 07/2020: lowest T left femur -2.2; rec cal vit d and exercise. Recheck 2023   . Adjustment disorder with anxiety 04/29/2020    Priority: Medium  . Dyslipidemia 09/24/2019    Priority: Medium  . History of polycythemia vera 11/12/2014    Priority: Medium  . Chronic allergic rhinitis 01/10/2020    Priority: Low  . Prediabetes 02/01/2021  . Benign neoplasm of rectum     Social  History: Patient  reports that she quit smoking about 12 years ago. Her smoking use included cigarettes. She has a 45.00 pack-year smoking history. She has never used smokeless tobacco. She reports current alcohol use. She reports that she does not use drugs.  Review of Systems: Ophthalmic: negative for eye pain, loss of vision or double vision Cardiovascular: negative for chest pain Respiratory: negative for SOB or persistent cough Gastrointestinal: negative for abdominal pain Genitourinary: negative for dysuria or gross hematuria MSK: negative for foot lesions Neurologic: negative for weakness or gait disturbance  Objective  Vitals: BP 122/78  Pulse 64   Temp (!) 97.1 F (36.2 C) (Temporal)   Ht _0  (1.626 m)   Wt 255 lb (115.7 kg)   SpO2 96%   BMI 43.77 kg/m  General: well appearing, no acute distress  Psych:  Alert and oriented, normal mood and affect  Diabetic education: ongoing education regarding chronic disease management for diabetes was given today. We continue to reinforce the ABC's of diabetic management: A1c (<7 or 8 dependent upon patient), tight blood pressure control, and cholesterol management with goal LDL < 100 minimally. We discuss diet strategies, exercise recommendations, medication options and possible side effects. At each visit, we review recommended immunizations and preventive care recommendations for diabetics and stress that good diabetic control can prevent other problems. See below for this patient's data.    Commons side effects, risks, benefits, and alternatives for medications and treatment plan prescribed today were discussed, and the patient expressed understanding of the given instructions. Patient is instructed to call or message via MyChart if he/she has any questions or concerns regarding our treatment plan. No barriers to understanding were identified. We discussed Red Flag symptoms and signs in detail. Patient expressed understanding regarding  what to do in case of urgent or emergency type symptoms.   Medication list was reconciled, printed and provided to the patient in AVS. Patient instructions and summary information was reviewed with the patient as documented in the AVS. This note was prepared with assistance of Dragon voice recognition software. Occasional wrong-word or sound-a-like substitutions may have occurred due to the inherent limitations of voice recognition software  This visit occurred during the SARS-CoV-2 public health emergency.  Safety protocols were in place, including screening questions prior to the visit, additional usage of staff PPE, and extensive cleaning of exam room while observing appropriate contact time as indicated for disinfecting solutions.

## 2021-02-01 NOTE — Patient Instructions (Addendum)
Please return in 6 months for recheck.   I will release your lab results to you on your MyChart account with further instructions. Please reply with any questions.   Please take the prescription for Shingrix to the pharmacy so they may administer the vaccinations. Your insurance will then cover the injections.   If you have any questions or concerns, please don't hesitate to send me a message via MyChart or call the office at [phone]. Thank you for visiting with [name] today! It's our pleasure caring for you.

## 2021-02-02 LAB — SARS CORONAVIRUS 2 (TAT 6-24 HRS): SARS Coronavirus 2: NEGATIVE

## 2021-02-03 ENCOUNTER — Ambulatory Visit
Admission: RE | Admit: 2021-02-03 | Discharge: 2021-02-03 | Disposition: A | Discharge status: Home / Self Care | Payer: Medicare Other | Source: Ambulatory Visit | Attending: Surgery | Admitting: Surgery

## 2021-02-03 ENCOUNTER — Other Ambulatory Visit: Payer: Self-pay

## 2021-02-03 DIAGNOSIS — R928 Other abnormal and inconclusive findings on diagnostic imaging of breast: Secondary | ICD-10-CM | POA: Diagnosis not present

## 2021-02-03 DIAGNOSIS — N6021 Fibroadenosis of right breast: Secondary | ICD-10-CM

## 2021-02-03 NOTE — H&P (Signed)
[facility]  Location: [facility] Surgery Patient #: [id number] DOB: [birth year] Single / Language: Cleophus Molt / Race: White Female   History of Present Illness  The patient is a 70 year old female who presents with a complaint of Breast problems.  Chief complaint: Complex sclerosing lesion of the right breast  This is a 70 year old female with multiple medical problems including a history of Crohn's disease and pulmonary fibrosis. She was recently found to have an abnormality in the right breast on screening mammography. This was a distortion with calcifications. She had a biopsy of this area showing a complex sclerosing lesion with calcifications. She has no previous history of breast cancer. She has had a benign mass biopsy in the right breast and she was in her 107s. She denies nipple discharge. As no family history of breast cancer. She is on oxygen when she sleeps secondary to her lung condition.   Past Surgical History  Breast Biopsy  Right. Colon Polyp Removal - Colonoscopy  Hip Surgery  Right. Oral Surgery  Resection of Small Bowel   Diagnostic Studies History  Mammogram  within last year Pap Smear  >5 years ago  Allergies  Lasix *DIURETICS*  Itching. Victoza *ANTIDIABETICS*  Shortness of breath. metFORMIN HCl *ANTIDIABETICS*  Diarrhea. OxyCONTIN *ANALGESICS - OPIOID*  Potassium Acetate *CHEMICALS*  predniSONE *CORTICOSTEROIDS*  Sulfacetamide *CHEMICALS*  Allergies Reconciled   Medication History  clonazePAM (0.5MG Tablet, Oral) Active. Aspirin (81MG Tablet Chewable, Oral) Active. Vitamin B Complex (Oral) Active. Cannabidiol (100MG/ML Solution, Oral) Active. ZyrTEC (10MG Tablet Chewable, Oral) Active. Flonase (50MCG/DOSE Inhaler, Nasal) Active. Advil (200MG Capsule, Oral) Active. Mesalamine ER (250MG Capsule ER, Oral) Active. Probiotic (Oral) Specific strength unknown - Active. Lasix (20MG Tablet, Oral) Active. Medications  Reconciled  Social History  Alcohol use  Occasional alcohol use. Illicit drug use  Remotely quit drug use. No caffeine use  Tobacco use  Former smoker.  Family History Alcohol Abuse  Brother. Cancer  Brother. Diabetes Mellitus  Brother, Father. Heart Disease  Brother. Heart disease in female family member before age 102  Hypertension  Brother, Mother. Seizure disorder  Brother.  Pregnancy / Birth History  Age at menarche  42 years. Age of menopause  <44 Gravida  0 Irregular periods  Para  0  Other Problems Anxiety Disorder  Crohn's Disease  Home Oxygen Use  Hypercholesterolemia  Pulmonary Embolism / Blood Clot in Legs  Sleep Apnea  Transfusion history  Ulcerative Colitis     Review of Systems  General Not Present- Appetite Loss, Chills, Fatigue, Fever, Night Sweats, Weight Gain and Weight Loss. Skin Not Present- Change in Wart/Mole, Dryness, Hives, Jaundice, New Lesions, Non-Healing Wounds, Rash and Ulcer. HEENT Not Present- Earache, Hearing Loss, Hoarseness, Nose Bleed, Oral Ulcers, Ringing in the Ears, Seasonal Allergies, Sinus Pain, Sore Throat, Visual Disturbances, Wears glasses/contact lenses and Yellow Eyes. Respiratory Not Present- Bloody sputum, Chronic Cough, Difficulty Breathing, Snoring and Wheezing. Breast Not Present- Breast Mass, Breast Pain, Nipple Discharge and Skin Changes. Cardiovascular Not Present- Chest Pain, Difficulty Breathing Lying Down, Leg Cramps, Palpitations, Rapid Heart Rate, Shortness of Breath and Swelling of Extremities. Gastrointestinal Not Present- Abdominal Pain, Bloating, Bloody Stool, Change in Bowel Habits, Chronic diarrhea, Constipation, Difficulty Swallowing, Excessive gas, Gets full quickly at meals, Hemorrhoids, Indigestion, Nausea, Rectal Pain and Vomiting. Female Genitourinary Not Present- Frequency, Nocturia, Painful Urination, Pelvic Pain and Urgency. Musculoskeletal Not Present- Back Pain, Joint Pain,  Joint Stiffness, Muscle Pain, Muscle Weakness and Swelling of Extremities. Neurological Not Present- Decreased Memory,  Fainting, Headaches, Numbness, Seizures, Tingling, Tremor, Trouble walking and Weakness. Psychiatric Not Present- Anxiety, Bipolar, Change in Sleep Pattern, Depression, Fearful and Frequent crying. Endocrine Not Present- Cold Intolerance, Excessive Hunger, Hair Changes, Heat Intolerance, Hot flashes and New Diabetes. Hematology Not Present- Blood Thinners, Easy Bruising, Excessive bleeding, Gland problems, HIV and Persistent Infections.  Vitals  Weight: 254.38 lb Height: 64in Body Surface Area: 2.17 m Body Mass Index: 43.66 kg/m  Temp.: 9F  Pulse: 92 (Regular)  P.OX: 89% (Room air) BP: 128/76(Sitting, Left Arm, Standard)     Physical Exam  The physical exam findings are as follows: Note: She appears well on exam and does not appear short of breath  There is a well-healed scar on the right breast from the biopsy site. There are no palpable masses in her breast. There is no axillary adenopathy. The nipple areolar complex is normal    Assessment & Plan  COMPLEX SCLEROSING LESION OF RIGHT BREAST (N64.89)  Impression: I reviewed the patient's notes in the electronic medical records. I have reviewed her mammograms, ultrasound, and pathology results. She has a complex sclerosing lesion in the right breast with calcifications. I discussed the diagnosis with her. I discussed the reasons for surgical excision of this area. This would be via a radioactive seed guided right breast lumpectomy. This will allow complete histologic evaluation of the area to rule out malignancy. I discussed this with her in detail and gave her a copy of the pathology results. I discussed the surgical procedure in detail. I discussed the risk which includes but is not limited to bleeding, infection, injury to surrounding structures, the need for further procedures if malignancy is found,  postoperative recovery, etc. She understands and wished to proceed with surgery

## 2021-02-04 ENCOUNTER — Ambulatory Visit (HOSPITAL_COMMUNITY): Payer: Medicare Other | Admitting: Physician Assistant

## 2021-02-04 ENCOUNTER — Encounter (HOSPITAL_COMMUNITY): Payer: Self-pay | Admitting: Surgery

## 2021-02-04 ENCOUNTER — Other Ambulatory Visit: Payer: Self-pay | Admitting: Surgery

## 2021-02-04 ENCOUNTER — Observation Stay (HOSPITAL_COMMUNITY)
Admission: RE | Admit: 2021-02-04 | Discharge: 2021-02-05 | Disposition: A | Discharge status: Home / Self Care | Payer: Medicare Other | Attending: Surgery | Admitting: Surgery

## 2021-02-04 ENCOUNTER — Ambulatory Visit (HOSPITAL_COMMUNITY): Payer: Medicare Other

## 2021-02-04 ENCOUNTER — Ambulatory Visit
Admission: RE | Admit: 2021-02-04 | Discharge: 2021-02-04 | Disposition: A | Discharge status: Home / Self Care | Payer: Medicare Other | Source: Ambulatory Visit | Attending: Surgery | Admitting: Surgery

## 2021-02-04 ENCOUNTER — Encounter (HOSPITAL_COMMUNITY)
Admission: RE | Disposition: A | Discharge status: Home / Self Care | Payer: Self-pay | Source: Home / Self Care | Attending: Surgery

## 2021-02-04 ENCOUNTER — Other Ambulatory Visit: Payer: Self-pay

## 2021-02-04 DIAGNOSIS — E785 Hyperlipidemia, unspecified: Secondary | ICD-10-CM | POA: Diagnosis not present

## 2021-02-04 DIAGNOSIS — R921 Mammographic calcification found on diagnostic imaging of breast: Secondary | ICD-10-CM | POA: Diagnosis not present

## 2021-02-04 DIAGNOSIS — Z87891 Personal history of nicotine dependence: Secondary | ICD-10-CM | POA: Diagnosis not present

## 2021-02-04 DIAGNOSIS — N6489 Other specified disorders of breast: Secondary | ICD-10-CM | POA: Diagnosis not present

## 2021-02-04 DIAGNOSIS — N6031 Fibrosclerosis of right breast: Principal | ICD-10-CM | POA: Insufficient documentation

## 2021-02-04 DIAGNOSIS — Z7982 Long term (current) use of aspirin: Secondary | ICD-10-CM | POA: Diagnosis not present

## 2021-02-04 DIAGNOSIS — Z9989 Dependence on other enabling machines and devices: Secondary | ICD-10-CM | POA: Diagnosis not present

## 2021-02-04 DIAGNOSIS — N6081 Other benign mammary dysplasias of right breast: Secondary | ICD-10-CM | POA: Diagnosis not present

## 2021-02-04 DIAGNOSIS — N6021 Fibroadenosis of right breast: Secondary | ICD-10-CM

## 2021-02-04 DIAGNOSIS — R928 Other abnormal and inconclusive findings on diagnostic imaging of breast: Secondary | ICD-10-CM | POA: Diagnosis not present

## 2021-02-04 DIAGNOSIS — N6091 Unspecified benign mammary dysplasia of right breast: Secondary | ICD-10-CM | POA: Diagnosis not present

## 2021-02-04 DIAGNOSIS — Z79899 Other long term (current) drug therapy: Secondary | ICD-10-CM | POA: Diagnosis not present

## 2021-02-04 DIAGNOSIS — G4733 Obstructive sleep apnea (adult) (pediatric): Secondary | ICD-10-CM | POA: Diagnosis not present

## 2021-02-04 HISTORY — PX: OTHER SURGICAL HISTORY: SHX169

## 2021-02-04 HISTORY — PX: BREAST LUMPECTOMY WITH RADIOACTIVE SEED LOCALIZATION: SHX6424

## 2021-02-04 SURGERY — BREAST LUMPECTOMY WITH RADIOACTIVE SEED LOCALIZATION
Anesthesia: General | Site: Breast | Laterality: Right

## 2021-02-04 MED ORDER — CLONAZEPAM 0.25 MG PO TBDP
0.2500 mg | ORAL_TABLET | Freq: Every day | ORAL | Status: DC | PRN
  Administered 2021-02-05: 0.5 mg via ORAL
  Filled 2021-02-04: qty 2

## 2021-02-04 MED ORDER — EPHEDRINE SULFATE-NACL 50-0.9 MG/10ML-% IV SOSY
PREFILLED_SYRINGE | INTRAVENOUS | Status: DC | PRN
  Administered 2021-02-04 (×3): 5 mg via INTRAVENOUS

## 2021-02-04 MED ORDER — CHLORHEXIDINE GLUCONATE 0.12 % MT SOLN
15.0000 mL | OROMUCOSAL | Status: AC
  Filled 2021-02-04: qty 15

## 2021-02-04 MED ORDER — PHENYLEPHRINE 40 MCG/ML (10ML) SYRINGE FOR IV PUSH (FOR BLOOD PRESSURE SUPPORT)
PREFILLED_SYRINGE | INTRAVENOUS | Status: DC | PRN
  Administered 2021-02-04 (×3): 40 ug via INTRAVENOUS

## 2021-02-04 MED ORDER — FLUOXETINE HCL 20 MG PO CAPS: 20.0000 mg | ORAL_CAPSULE | Freq: Every day | ORAL | Status: DC

## 2021-02-04 MED ORDER — ONDANSETRON HCL 4 MG/2ML IJ SOLN: 4.0000 mg | Freq: Four times a day (QID) | INTRAMUSCULAR | Status: DC | PRN

## 2021-02-04 MED ORDER — DEXAMETHASONE SODIUM PHOSPHATE 10 MG/ML IJ SOLN
INTRAMUSCULAR | Status: DC | PRN
  Administered 2021-02-04: 4 mg via INTRAVENOUS

## 2021-02-04 MED ORDER — OXYCODONE HCL 5 MG PO TABS: 5.0000 mg | ORAL_TABLET | Freq: Once | ORAL | Status: DC | PRN

## 2021-02-04 MED ORDER — MIDAZOLAM HCL 2 MG/2ML IJ SOLN
INTRAMUSCULAR | Status: AC
  Filled 2021-02-04: qty 2

## 2021-02-04 MED ORDER — ENOXAPARIN SODIUM 40 MG/0.4ML ~~LOC~~ SOLN: 40.0000 mg | SUBCUTANEOUS | Status: DC

## 2021-02-04 MED ORDER — BUPIVACAINE HCL (PF) 0.25 % IJ SOLN
INTRAMUSCULAR | Status: AC
  Filled 2021-02-04: qty 30

## 2021-02-04 MED ORDER — BUPIVACAINE HCL (PF) 0.25 % IJ SOLN
INTRAMUSCULAR | Status: DC | PRN
  Administered 2021-02-04: 20 mL

## 2021-02-04 MED ORDER — CHLORHEXIDINE GLUCONATE 0.12 % MT SOLN
OROMUCOSAL | Status: AC
  Administered 2021-02-04: 15 mL via OROMUCOSAL
  Filled 2021-02-04: qty 15

## 2021-02-04 MED ORDER — LACTATED RINGERS IV SOLN: INTRAVENOUS | Status: DC

## 2021-02-04 MED ORDER — FENTANYL CITRATE (PF) 100 MCG/2ML IJ SOLN
INTRAMUSCULAR | Status: DC | PRN
  Administered 2021-02-04: 25 ug via INTRAVENOUS

## 2021-02-04 MED ORDER — CHLORHEXIDINE GLUCONATE CLOTH 2 % EX PADS: 6.0000 | MEDICATED_PAD | Freq: Once | CUTANEOUS | Status: DC

## 2021-02-04 MED ORDER — 0.9 % SODIUM CHLORIDE (POUR BTL) OPTIME
TOPICAL | Status: DC | PRN
  Administered 2021-02-04: 1000 mL

## 2021-02-04 MED ORDER — TRAMADOL HCL 50 MG PO TABS: 50.0000 mg | ORAL_TABLET | Freq: Four times a day (QID) | ORAL | Status: DC | PRN

## 2021-02-04 MED ORDER — OXYCODONE HCL 5 MG/5ML PO SOLN: 5.0000 mg | Freq: Once | ORAL | Status: DC | PRN

## 2021-02-04 MED ORDER — FENTANYL CITRATE (PF) 100 MCG/2ML IJ SOLN: 25.0000 ug | INTRAMUSCULAR | Status: DC | PRN

## 2021-02-04 MED ORDER — ACETAMINOPHEN 500 MG PO TABS: 1000.0000 mg | ORAL_TABLET | ORAL | Status: AC

## 2021-02-04 MED ORDER — OXYCODONE HCL 5 MG PO TABS: 5.0000 mg | ORAL_TABLET | ORAL | Status: DC | PRN

## 2021-02-04 MED ORDER — PROPOFOL 10 MG/ML IV BOLUS
INTRAVENOUS | Status: DC | PRN
  Administered 2021-02-04: 150 mg via INTRAVENOUS

## 2021-02-04 MED ORDER — DIPHENHYDRAMINE HCL 50 MG/ML IJ SOLN: 12.5000 mg | Freq: Four times a day (QID) | INTRAMUSCULAR | Status: DC | PRN

## 2021-02-04 MED ORDER — CEFAZOLIN SODIUM-DEXTROSE 2-4 GM/100ML-% IV SOLN
INTRAVENOUS | Status: AC
  Filled 2021-02-04: qty 100

## 2021-02-04 MED ORDER — ACETAMINOPHEN 650 MG RE SUPP: 650.0000 mg | Freq: Four times a day (QID) | RECTAL | Status: DC | PRN

## 2021-02-04 MED ORDER — FENTANYL CITRATE (PF) 250 MCG/5ML IJ SOLN
INTRAMUSCULAR | Status: AC
  Filled 2021-02-04: qty 5

## 2021-02-04 MED ORDER — DEXTROSE 5 % IV SOLN
2.0000 g | INTRAVENOUS | Status: AC
  Administered 2021-02-04: 2 g via INTRAVENOUS

## 2021-02-04 MED ORDER — ACETAMINOPHEN 325 MG PO TABS
650.0000 mg | ORAL_TABLET | Freq: Four times a day (QID) | ORAL | Status: DC | PRN
  Administered 2021-02-05: 650 mg via ORAL
  Filled 2021-02-04: qty 2

## 2021-02-04 MED ORDER — LIDOCAINE HCL (CARDIAC) PF 100 MG/5ML IV SOSY
PREFILLED_SYRINGE | INTRAVENOUS | Status: DC | PRN
  Administered 2021-02-04: 60 mg via INTRAVENOUS

## 2021-02-04 MED ORDER — DIPHENHYDRAMINE HCL 12.5 MG/5ML PO ELIX
12.5000 mg | ORAL_SOLUTION | Freq: Four times a day (QID) | ORAL | Status: DC | PRN
Start: 2021-02-04 — End: 2021-02-05

## 2021-02-04 MED ORDER — ONDANSETRON HCL 4 MG/2ML IJ SOLN
INTRAMUSCULAR | Status: DC | PRN
  Administered 2021-02-04: 4 mg via INTRAVENOUS

## 2021-02-04 MED ORDER — ONDANSETRON 4 MG PO TBDP: 4.0000 mg | ORAL_TABLET | Freq: Four times a day (QID) | ORAL | Status: DC | PRN

## 2021-02-04 SURGICAL SUPPLY — 32 items
APPLIER CLIP 9.375 MED OPEN (MISCELLANEOUS)
BINDER BREAST LRG (GAUZE/BANDAGES/DRESSINGS) IMPLANT
BINDER BREAST XLRG (GAUZE/BANDAGES/DRESSINGS) IMPLANT
BINDER BREAST XXLRG (GAUZE/BANDAGES/DRESSINGS) ×2 IMPLANT
CANISTER SUCT 3000ML PPV (MISCELLANEOUS) ×2 IMPLANT
CHLORAPREP W/TINT 26 (MISCELLANEOUS) ×2 IMPLANT
CLIP APPLIE 9.375 MED OPEN (MISCELLANEOUS) IMPLANT
COVER PROBE W GEL 5X96 (DRAPES) ×2 IMPLANT
COVER SURGICAL LIGHT HANDLE (MISCELLANEOUS) ×2 IMPLANT
COVER WAND RF STERILE (DRAPES) IMPLANT
DERMABOND ADVANCED (GAUZE/BANDAGES/DRESSINGS) ×1
DERMABOND ADVANCED .7 DNX12 (GAUZE/BANDAGES/DRESSINGS) ×1 IMPLANT
DEVICE DUBIN SPECIMEN MAMMOGRA (MISCELLANEOUS) ×2 IMPLANT
DRAPE CHEST BREAST 15X10 FENES (DRAPES) ×2 IMPLANT
ELECT CAUTERY BLADE 6.4 (BLADE) ×2 IMPLANT
ELECT REM PT RETURN 9FT ADLT (ELECTROSURGICAL) ×2
ELECTRODE REM PT RTRN 9FT ADLT (ELECTROSURGICAL) ×1 IMPLANT
GLOVE SURG SIGNA 7.5 PF LTX (GLOVE) ×2 IMPLANT
GOWN STRL REUS W/ TWL LRG LVL3 (GOWN DISPOSABLE) ×2 IMPLANT
GOWN STRL REUS W/ TWL XL LVL3 (GOWN DISPOSABLE) ×1 IMPLANT
GOWN STRL REUS W/TWL LRG LVL3 (GOWN DISPOSABLE) ×2
GOWN STRL REUS W/TWL XL LVL3 (GOWN DISPOSABLE) ×1
KIT BASIN OR (CUSTOM PROCEDURE TRAY) ×2 IMPLANT
KIT MARKER MARGIN INK (KITS) ×2 IMPLANT
NEEDLE HYPO 25GX1X1/2 BEV (NEEDLE) ×2 IMPLANT
NS IRRIG 1000ML POUR BTL (IV SOLUTION) ×2 IMPLANT
PACK GENERAL/GYN (CUSTOM PROCEDURE TRAY) ×2 IMPLANT
SUT MNCRL AB 4-0 PS2 18 (SUTURE) ×2 IMPLANT
SUT VIC AB 3-0 SH 18 (SUTURE) ×2 IMPLANT
SYR CONTROL 10ML LL (SYRINGE) ×2 IMPLANT
TOWEL GREEN STERILE (TOWEL DISPOSABLE) ×2 IMPLANT
TOWEL GREEN STERILE FF (TOWEL DISPOSABLE) ×2 IMPLANT

## 2021-02-04 NOTE — Interval H&P Note (Signed)
History and Physical Interval Note: no change in H and P  02/04/2021 12:59 PM  Patty [name]  has presented today for surgery, with the diagnosis of [place].  The various methods of treatment have been discussed with the patient and family. After consideration of risks, benefits and other options for treatment, the patient has consented to  Procedure(s): RIGHT BREAST LUMPECTOMY WITH RADIOACTIVE SEED LOCALIZATION (Right) as a surgical intervention.  The patient's history has been reviewed, patient examined, no change in status, stable for surgery.  I have reviewed the patient's chart and labs.  Questions were answered to the patient's satisfaction.     Coralie Keens

## 2021-02-04 NOTE — Op Note (Signed)
PATIENT:Patty [name] 70 y.o. female  PRE-OPERATIVE DIAGNOSIS:Complex sclerosing lesion - right breast  POST-OPERATIVE DIAGNOSIS: Complex sclerosing lesion - right breast  PROCEDURE:  Right breast lumpectomy with radioactive seed localization  SURGEON: Surgeon(s) and Role: *Harl Bowie, MD  ASSISTANTS: , MD  ANESTHESIA:General Endotracheal Anesthesia  EBL:34m   BLOOD ADMINISTERED:None  DRAINS:None  SPECIMEN: Right breast lumpectomy  DISPOSITION OF SPECIMEN:PATHOLOGY  COUNTS:Sponge an instrument count was correct  DICTATION:  The patient was taken to the operating and placed in the supine positionin the operating room table. Pneumatic compression stockings were placed in bilateral lower extremities.The patient was prepped and draped in the usual sterile fashion. A time out wasperformed per hospital's policy. The probe was used to identify the appropriate positioning of the incision. A transverse incision was made in the right upper quadrant of the right breast. The incision was carried down circumferentially with the bovie electrocautery while lifting the target breast tissue up with an allis clamp. The breast lumpectomy specimen was then marked and sent to pathology. Hemostasis was achieved.   The subcutaneous tissues were approximated with four 3-0 vicryl sutures. The skin was then closed with a subcuticular continuous 4-0 Monocryl andDermabond skin glue.  The patient was awaken and taken to the recovery room in stable condition.  PLAN OF CARE:Discharge to home after PACU  PATIENT DISPOSITION:PACU - hemodynamically stable.  Delay start of Pharmacological VTE agent (>24hrs) due to surgical blood loss or risk of bleeding:not applicable

## 2021-02-04 NOTE — Anesthesia Procedure Notes (Signed)
Procedure Name: LMA Insertion Date/Time: 02/04/2021 1:13 PM Performed by: Inda Coke, CRNA Pre-anesthesia Checklist: Patient identified, Emergency Drugs available, Suction available and Patient being monitored Patient Re-evaluated:Patient Re-evaluated prior to induction Oxygen Delivery Method: Circle System Utilized Preoxygenation: Pre-oxygenation with 100% oxygen Induction Type: IV induction Ventilation: Mask ventilation without difficulty LMA: LMA inserted LMA Size: 4.0 Number of attempts: 1 Airway Equipment and Method: Bite block Placement Confirmation: positive ETCO2 Tube secured with: Tape Dental Injury: Teeth and Oropharynx as per pre-operative assessment

## 2021-02-04 NOTE — Progress Notes (Signed)
Pt. Placed on auto cpap 20/5 with 2L in line per pt. VSS stable, pt. Resting comfortable. RT will continue to monitor.

## 2021-02-04 NOTE — Transfer of Care (Signed)
Immediate Anesthesia Transfer of Care Note  Patient: Patty [name]  Procedure(s) Performed: RIGHT BREAST LUMPECTOMY WITH RADIOACTIVE SEED LOCALIZATION (Right Breast)  Patient Location: PACU  Anesthesia Type:General  Level of Consciousness: awake and alert   Airway & Oxygen Therapy: Patient Spontanous Breathing and Patient connected to face mask oxygen  Post-op Assessment: Report given to RN and Post -op Vital signs reviewed and stable  Post vital signs: Reviewed and stable  Last Vitals:  Vitals Value Taken Time  BP 105/63 02/04/21 1358  Temp    Pulse 72 02/04/21 1359  Resp 21 02/04/21 1359  SpO2 100 % 02/04/21 1359  Vitals shown include unvalidated device data.  Last Pain:  Vitals:   02/04/21 1214  TempSrc:   PainSc: 0-No pain         Complications: No complications documented.

## 2021-02-05 ENCOUNTER — Encounter (HOSPITAL_COMMUNITY): Payer: Self-pay | Admitting: Surgery

## 2021-02-05 DIAGNOSIS — Z87891 Personal history of nicotine dependence: Secondary | ICD-10-CM | POA: Diagnosis not present

## 2021-02-05 DIAGNOSIS — Z7982 Long term (current) use of aspirin: Secondary | ICD-10-CM | POA: Diagnosis not present

## 2021-02-05 DIAGNOSIS — N6489 Other specified disorders of breast: Secondary | ICD-10-CM | POA: Diagnosis not present

## 2021-02-05 DIAGNOSIS — N6031 Fibrosclerosis of right breast: Secondary | ICD-10-CM | POA: Diagnosis not present

## 2021-02-05 DIAGNOSIS — Z79899 Other long term (current) drug therapy: Secondary | ICD-10-CM | POA: Diagnosis not present

## 2021-02-05 LAB — BASIC METABOLIC PANEL
Anion gap: 10 (ref 5–15)
BUN: 18 mg/dL (ref 8–23)
CO2: 23 mmol/L (ref 22–32)
Calcium: 8.9 mg/dL (ref 8.9–10.3)
Chloride: 104 mmol/L (ref 98–111)
Creatinine, Ser: 0.75 mg/dL (ref 0.44–1.00)
GFR, Estimated: >60 mL/min (ref >60)
Glucose, Bld: 147 mg/dL — ABNORMAL HIGH (ref 70–99)
Potassium: 4 mmol/L (ref 3.5–5.1)
Sodium: 137 mmol/L (ref 135–145)

## 2021-02-05 LAB — CBC
HCT: 40.8 % (ref 36.0–46.0)
Hemoglobin: 12.9 g/dL (ref 12.0–15.0)
MCH: 29 pg (ref 26.0–34.0)
MCHC: 31.6 g/dL (ref 30.0–36.0)
MCV: 91.7 fL (ref 80.0–100.0)
Platelets: 283 10*3/uL (ref 150–400)
RBC: 4.45 MIL/uL (ref 3.87–5.11)
RDW: 14.4 % (ref 11.5–15.5)
WBC: 9.5 10*3/uL (ref 4.0–10.5)
nRBC: 0 % (ref 0.0–0.2)

## 2021-02-05 MED ORDER — TRAMADOL HCL 50 MG PO TABS: 50.0000 mg | ORAL_TABLET | Freq: Four times a day (QID) | ORAL | 0 refills | Status: AC | PRN

## 2021-02-05 NOTE — Progress Notes (Signed)
Patient ID: Patty [name], female   DOB: Jul 18, 1951, 69 y.o.   MRN: [id number]  Doing well No complaints Incision clean  Plan: Discharge home this morning

## 2021-02-05 NOTE — Anesthesia Postprocedure Evaluation (Signed)
Anesthesia Post Note  Patient: Patty [name]  Procedure(s) Performed: RIGHT BREAST LUMPECTOMY WITH RADIOACTIVE SEED LOCALIZATION (Right Breast)     Patient location during evaluation: PACU Anesthesia Type: General Level of consciousness: awake and alert Pain management: pain level controlled Vital Signs Assessment: post-procedure vital signs reviewed and stable Respiratory status: spontaneous breathing, nonlabored ventilation, respiratory function stable and patient connected to nasal cannula oxygen Cardiovascular status: blood pressure returned to baseline and stable Postop Assessment: no apparent nausea or vomiting Anesthetic complications: no   No complications documented.  Last Vitals:  Vitals:   02/05/21 0209 02/05/21 0623  BP: 122/61 (!) 109/59  Pulse: 67 63  Resp: 17 16  Temp: 36.7 C 36.7 C  SpO2: 96% 96%    Last Pain:  Vitals:   02/04/21 2035  TempSrc: Oral  PainSc:                  [place]

## 2021-02-05 NOTE — Discharge Summary (Signed)
Physician Discharge Summary  Patient ID: Patty [name] MRN: [id number] DOB/AGE: Jun 30, 1951 70 y.o.  Admit date: 02/04/2021 Discharge date: 02/05/2021  Admission Diagnoses:  Discharge Diagnoses:  Active Problems:   Sclerosing adenosis of right breast   Discharged Condition: good  Hospital Course: uneventful post op recovery.  Discharged home POD#1  Consults: None  Significant Diagnostic Studies:   Treatments: surgery: radioactive seed guided right breast lumpectomy  Discharge Exam: Blood pressure (!) 109/59, pulse 63, temperature 98 F (36.7 C), resp. rate 16, height 5' 4" (1.626 m), weight 115.7 kg, SpO2 96 %. General appearance: alert, cooperative and no distress Resp: clear to auscultation bilaterally Cardio: regular rate and rhythm, S1, S2 normal, no murmur, click, rub or gallop Incision/Wound:incision clean, healing well  Disposition: Discharge disposition: 01-Home or Self Care        Allergies as of 02/05/2021      Reactions   Victoza [liraglutide] Shortness Of Breath   Lasix [furosemide] Itching   Hypersensitivity - pt can take as needed    Metformin And Related Diarrhea   Increased moodiness   Onion Other (See Comments)   sereve diarrhea, stomach pains (Crohn's flares)   Potassium-containing Compounds Other (See Comments)   Due to chron's, difficult passing though kidneys not allowing absorption in the body    Prednisone Other (See Comments)   Altered mood   Sulfa Drugs Cross Reactors Other (See Comments)   "feels like bugs crawling on me"      Medication List    TAKE these medications   amoxicillin 500 MG capsule Commonly known as: AMOXIL Take 2,000 mg by mouth See admin instructions. Before dental procedures   aspirin EC 81 MG tablet Take 81 mg by mouth daily.   b complex vitamins capsule Take 1 capsule by mouth once a week.   Calcium 1200 1200-1000 MG-UNIT Chew Chew 1 tablet by mouth daily.   CANNABIDIOL PO Take 1 Dose by mouth 2  (two) times daily as needed (anxiety/sleep.). CBD Oil   cetirizine 10 MG tablet Commonly known as: ZYRTEC Take 10 mg by mouth daily as needed (sinus/allergies.).   cetirizine-pseudoephedrine 5-120 MG tablet Commonly known as: ZYRTEC-D Take 1 tablet by mouth daily as needed (sinus headaches.).   clonazePAM 0.5 MG tablet Commonly known as: KLONOPIN TAKE 0.5-1 TABLETS (0.25-0.5 MG TOTAL) BY MOUTH DAILY AS NEEDED (FOR ANXIETY OR SLEEP.). What changed: reasons to take this   FLUoxetine 20 MG capsule Commonly known as: PROzac Take 1 capsule (20 mg total) by mouth daily.   fluticasone 50 MCG/ACT nasal spray Commonly known as: FLONASE Place 1-2 sprays into both nostrils daily as needed for allergies or rhinitis.   furosemide 20 MG tablet Commonly known as: LASIX Take 1 tablet (20 mg total) by mouth as needed for fluid.   ibuprofen 200 MG tablet Commonly known as: ADVIL Take 400-800 mg by mouth every 8 (eight) hours as needed (pain.).   ICAPS AREDS 2 PO Take 1 tablet by mouth daily.   Pentasa 250 MG CR capsule Generic drug: mesalamine Take 250-500 mg by mouth 4 (four) times daily as needed (crohn's disease flare ups).   prednisoLONE acetate 1 % ophthalmic suspension Commonly known as: PRED FORTE Place 1 drop into both eyes 2 (two) times daily as needed (Uveitis).   PROBIOTIC PO Take 1 capsule by mouth 2 (two) times a week. As needed   REFRESH DRY EYE THERAPY OP Place 1 drop into both eyes daily.   traMADol 50 MG tablet Commonly  known as: ULTRAM Take 1 tablet (50 mg total) by mouth every 6 (six) hours as needed.   VITAMIN B-12 IJ Inject 1,000 mcg as directed every 30 (thirty) days.       Follow-up Information    Coralie Keens, MD. Schedule an appointment as soon as possible for a visit in 2 week(s).   Specialty: General Surgery Contact information: [address] [place] [address] [phone]               Signed: Coralie Keens 02/05/2021, 8:15 AM

## 2021-02-05 NOTE — Discharge Instructions (Signed)
[place] Office Phone Number [phone]  BREAST BIOPSY/ PARTIAL MASTECTOMY: POST OP INSTRUCTIONS  Always review your discharge instruction sheet given to you by the facility where your surgery was performed.  IF YOU HAVE DISABILITY OR FAMILY LEAVE FORMS, YOU MUST BRING THEM TO THE OFFICE FOR PROCESSING.  DO NOT GIVE THEM TO YOUR DOCTOR.  1. A prescription for pain medication may be given to you upon discharge.  Take your pain medication as prescribed, if needed.  If narcotic pain medicine is not needed, then you may take acetaminophen (Tylenol) or ibuprofen (Advil) as needed. 2. Take your usually prescribed medications unless otherwise directed 3. If you need a refill on your pain medication, please contact your pharmacy.  They will contact our office to request authorization.  Prescriptions will not be filled after 5pm or on week-ends. 4. You should eat very light the first 24 hours after surgery, such as soup, crackers, pudding, etc.  Resume your normal diet the day after surgery. 5. Most patients will experience some swelling and bruising in the breast.  Ice packs and a good support bra will help.  Swelling and bruising can take several days to resolve.  6. It is common to experience some constipation if taking pain medication after surgery.  Increasing fluid intake and taking a stool softener will usually help or prevent this problem from occurring.  A mild laxative (Milk of Magnesia or Miralax) should be taken according to package directions if there are no bowel movements after 48 hours. 7. Unless discharge instructions indicate otherwise, you may remove your bandages 24-48 hours after surgery, and you may shower at that time.  You may have steri-strips (small skin tapes) in place directly over the incision.  These strips should be left on the skin for 7-10 days.  If your surgeon used skin glue on the incision, you may shower in 24 hours.  The glue will flake off over the  next 2-3 weeks.  Any sutures or staples will be removed at the office during your follow-up visit. 8. ACTIVITIES:  You may resume regular daily activities (gradually increasing) beginning the next day.  Wearing a good support bra or sports bra minimizes pain and swelling.  You may have sexual intercourse when it is comfortable. a. You may drive when you no longer are taking prescription pain medication, you can comfortably wear a seatbelt, and you can safely maneuver your car and apply brakes. b. RETURN TO WORK:  ______________________________________________________________________________________ 9. You should see your doctor in the office for a follow-up appointment approximately two weeks after your surgery.  Your doctor's nurse will typically make your follow-up appointment when she calls you with your pathology report.  Expect your pathology report 2-3 business days after your surgery.  You may call to check if you do not hear from [name] after three days. 10. OTHER INSTRUCTIONS: _______________________________________________________________________________________________ _____________________________________________________________________________________________________________________________________ _____________________________________________________________________________________________________________________________________ _____________________________________________________________________________________________________________________________________  WHEN TO CALL YOUR DOCTOR: 1. Fever over 101.0 2. Nausea and/or vomiting. 3. Extreme swelling or bruising. 4. Continued bleeding from incision. 5. Increased pain, redness, or drainage from the incision.  The clinic staff is available to answer your questions during regular business hours.  Please don't hesitate to call and ask to speak to one of the nurses for clinical concerns.  If you have a medical emergency, go to the nearest  emergency room or call 911.  A surgeon from [facility] is always on call at the hospital.  For further questions, please visit [email or web address]

## 2021-02-05 NOTE — Progress Notes (Signed)
Discharge instructions reviewed with patient, teachback completed, IV removed, belongings returned to patient.

## 2021-02-08 LAB — SURGICAL PATHOLOGY

## 2021-02-11 ENCOUNTER — Encounter (HOSPITAL_COMMUNITY): Payer: Self-pay | Admitting: *Deleted

## 2021-02-11 NOTE — Progress Notes (Signed)
Called pt to remind her of orientation appointment for Pulmonary Rehab. We discussed wearing mask, proper shoes and directions to the department.She voiced understanding. I confirmed with pt  that she was cleared to be able to exercise since her breast procedure and she states that she has been.

## 2021-02-15 ENCOUNTER — Encounter (HOSPITAL_COMMUNITY)
Admission: RE | Admit: 2021-02-15 | Discharge: 2021-02-15 | Disposition: A | Discharge status: Home / Self Care | Payer: Medicare Other | Source: Ambulatory Visit | Attending: Internal Medicine | Admitting: Internal Medicine

## 2021-02-15 ENCOUNTER — Other Ambulatory Visit: Payer: Self-pay

## 2021-02-15 VITALS — BP 108/60 | HR 104 | Ht 64.0 in | Wt 257.5 lb

## 2021-02-15 DIAGNOSIS — J849 Interstitial pulmonary disease, unspecified: Secondary | ICD-10-CM | POA: Insufficient documentation

## 2021-02-15 NOTE — Progress Notes (Signed)
Pulmonary Individual Treatment Plan  Patient Details  Name: Patty [name] MRN: [id number] Date of Birth: [birth year] Referring Provider:   April Manson Pulmonary Rehab Walk Test from 02/15/2021 in [place]  Referring Provider Dr. Chase Caller      Initial Encounter Date:  Flowsheet Row Pulmonary Rehab Walk Test from 02/15/2021 in [place]  Date 02/15/21      Visit Diagnosis: Interstitial lung disease ([place])  Patient's Home Medications on Admission:   Current Outpatient Medications:  .  amoxicillin (AMOXIL) 500 MG capsule, Take 2,000 mg by mouth See admin instructions. Before dental procedures, Disp: , Rfl:  .  aspirin EC 81 MG tablet, Take 81 mg by mouth daily., Disp: , Rfl:  .  b complex vitamins capsule, Take 1 capsule by mouth once a week., Disp: , Rfl:  .  Calcium Carbonate-Vit D-Min (CALCIUM 1200) 1200-1000 MG-UNIT CHEW, Chew 1 tablet by mouth daily., Disp: , Rfl:  .  CANNABIDIOL PO, Take 1 Dose by mouth 2 (two) times daily as needed (anxiety/sleep.). CBD Oil, Disp: , Rfl:  .  cetirizine (ZYRTEC) 10 MG tablet, Take 10 mg by mouth daily as needed (sinus/allergies.)., Disp: , Rfl:  .  cetirizine-pseudoephedrine (ZYRTEC-D) 5-120 MG tablet, Take 1 tablet by mouth daily as needed (sinus headaches.)., Disp: , Rfl:  .  clonazePAM (KLONOPIN) 0.5 MG tablet, TAKE 0.5-1 TABLETS (0.25-0.5 MG TOTAL) BY MOUTH DAILY AS NEEDED (FOR ANXIETY OR SLEEP.). (Patient taking differently: Take 0.25-0.5 mg by mouth daily as needed for anxiety (for anxiety or sleep.).), Disp: 90 tablet, Rfl: 1 .  Cyanocobalamin (VITAMIN B-12 IJ), Inject 1,000 mcg as directed every 30 (thirty) days., Disp: , Rfl:  .  FLUoxetine (PROZAC) 20 MG capsule, Take 1 capsule (20 mg total) by mouth daily., Disp: 90 capsule, Rfl: 3 .  fluticasone (FLONASE) 50 MCG/ACT nasal spray, Place 1-2 sprays into both nostrils daily as needed for allergies or rhinitis., Disp: ,  Rfl:  .  furosemide (LASIX) 20 MG tablet, Take 1 tablet (20 mg total) by mouth as needed for fluid., Disp: 90 tablet, Rfl: 3 .  Glycerin-Polysorbate 80 (REFRESH DRY EYE THERAPY OP), Place 1 drop into both eyes daily., Disp: , Rfl:  .  ibuprofen (ADVIL) 200 MG tablet, Take 400-800 mg by mouth every 8 (eight) hours as needed (pain.)., Disp: , Rfl:  .  mesalamine (PENTASA) 250 MG CR capsule, Take 250-500 mg by mouth 4 (four) times daily as needed (crohn's disease flare ups)., Disp: , Rfl:  .  Multiple Vitamins-Minerals (ICAPS AREDS 2 PO), Take 1 tablet by mouth daily., Disp: , Rfl:  .  prednisoLONE acetate (PRED FORTE) 1 % ophthalmic suspension, Place 1 drop into both eyes 2 (two) times daily as needed (Uveitis)., Disp: , Rfl:  .  Probiotic Product (PROBIOTIC PO), Take 1 capsule by mouth 2 (two) times a week. As needed, Disp: , Rfl:  .  traMADol (ULTRAM) 50 MG tablet, Take 1 tablet (50 mg total) by mouth every 6 (six) hours as needed., Disp: 20 tablet, Rfl: 0 No current facility-administered medications for this encounter.  Facility-Administered Medications Ordered in Other Encounters:  .  6 CHG cloth bath night before surgery, , , Once **AND** 6 CHG cloth bath AM of surgery, , , Once **AND** Chlorhexidine Gluconate Cloth 2 % PADS 6 each, 6 each, Topical, Once **AND** Chlorhexidine Gluconate Cloth 2 % PADS 6 each, 6 each, Topical, Once, Coralie Keens, MD  Past Medical History:  Past Medical History:  Diagnosis Date  . Abnormal nuclear stress test    a. 07/2017: cath with no CAD  . Amyloidosis ([place])   . Anxiety   . Arthritis   . Crohn disease ([place])   . Crohn's colitis ([place])   . DVT (deep venous thrombosis) ([place])   . Dyspnea   . Grade I diastolic dysfunction   . Hyperlipidemia   . ILD (interstitial lung disease) ([place])   . OSA on CPAP    CPAP    AND O2  . Pernicious anemia   . Polycythemia vera ([place])   . Pulmonary embolism ([place]) 2010   s/p hip surgery , reports this was never truly  confirmed   . Sleep disorder 01/10/2020   Uses klonopin for sleep since 2014; sleep walking. And anxiety    Tobacco Use: Social History   Tobacco Use  Smoking Status Former Smoker  . Packs/day: 1.00  . Years: 45.00  . Pack years: 45.00  . Types: Cigarettes  . Quit date: 12/26/2008  . Years since quitting: 12.1  Smokeless Tobacco Never Used    Labs: Recent Chemical engineer    Labs for ITP Cardiac and Pulmonary Rehab Latest Ref Rng & Units 09/09/2020 12/07/2020 12/07/2020 12/07/2020 02/01/2021   Cholestrol 0 - 200 mg/dL - - - - 190   LDLCALC 0 - 99 mg/dL - - - - 120(H)   HDL >39.00 mg/dL - - - - 55.60   Trlycerides 0.0 - 149.0 mg/dL - - - - 75.0   Hemoglobin A1c 4.6 - 6.5 % 6.3(H) - - - 6.4   HCO3 20.0 - 28.0 mmol/L - 27.0 26.9 26.6 -   TCO2 22 - 32 mmol/L - _0 -   O2SAT % - 62.0 62.0 64.0 -      Capillary Blood Glucose: Lab Results  Component Value Date   GLUCAP 123 (H) 01/28/2021     Pulmonary Assessment Scores:  Pulmonary Assessment Scores    Row Name 02/15/21 1140 02/15/21 1150 02/15/21 1523     ADL UCSD   ADL Phase Entry Entry Entry   SOB Score total 39 39 -     CAT Score   CAT Score 9 9 -     mMRC Score   mMRC Score - - 3         UCSD: Self-administered rating of dyspnea associated with activities of daily living (ADLs) 6-point scale (0 = "not at all" to 5 = "maximal or unable to do because of breathlessness")  Scoring Scores range from 0 to 120.  Minimally important difference is 5 units  CAT: CAT can identify the health impairment of COPD patients and is better correlated with disease progression.  CAT has a scoring range of zero to 40. The CAT score is classified into four groups of low (less than 10), medium (10 - 20), high (21-30) and very high (31-40) based on the impact level of disease on health status. A CAT score over 10 suggests significant symptoms.  A worsening CAT score could be explained by an exacerbation, poor medication  adherence, poor inhaler technique, or progression of COPD or comorbid conditions.  CAT MCID is 2 points  mMRC: mMRC (Modified Medical Research Council) Dyspnea Scale is used to assess the degree of baseline functional disability in patients of respiratory disease due to dyspnea. No minimal important difference is established. A decrease in score of 1 point or greater is considered a positive change.   Pulmonary  Function Assessment:  Pulmonary Function Assessment - 02/15/21 1120      Breath   Bilateral Breath Sounds Clear    Shortness of Breath Yes;Limiting activity           Exercise Target Goals: Exercise Program Goal: Individual exercise prescription set using results from initial 6 min walk test and THRR while considering  patient's activity barriers and safety.   Exercise Prescription Goal: Initial exercise prescription builds to 30-45 minutes a day of aerobic activity, 2-3 days per week.  Home exercise guidelines will be given to patient during program as part of exercise prescription that the participant will acknowledge.  Activity Barriers & Risk Stratification:  Activity Barriers & Cardiac Risk Stratification - 02/15/21 1115      Activity Barriers & Cardiac Risk Stratification   Activity Barriers Right Hip Replacement;Deconditioning;Muscular Weakness;Shortness of Breath           6 Minute Walk:  6 Minute Walk    Row Name 02/15/21 1224         6 Minute Walk   Phase Initial     Distance 715 feet     Walk Time 6 minutes     # of Rest Breaks 0     MPH 1.35     METS 1.81     RPE 14     Perceived Dyspnea  3     VO2 Peak 6.33     Symptoms Yes (comment)     Comments Oxygen saturation dropped below 88% twice and had to increase to 4L with exertion     Resting HR 99 bpm     Resting BP 102/70     Resting Oxygen Saturation  96 %     Exercise Oxygen Saturation  during 6 min walk 86 %     Max Ex. HR 136 bpm     Max Ex. BP 182/74     2 Minute Post BP 140/76            Interval HR   1 Minute HR 101     2 Minute HR 101     3 Minute HR 124     4 Minute HR 128     5 Minute HR 125     6 Minute HR 136     2 Minute Post HR 102     Interval Heart Rate? Yes           Interval Oxygen   Interval Oxygen? Yes     Baseline Oxygen Saturation % 96 %     1 Minute Oxygen Saturation % 91 %     1 Minute Liters of Oxygen 2 L     2 Minute Oxygen Saturation % 86 %     2 Minute Liters of Oxygen 2 L     3 Minute Oxygen Saturation % 92 %     3 Minute Liters of Oxygen 3 L     4 Minute Oxygen Saturation % 87 %     4 Minute Liters of Oxygen 3 L     5 Minute Oxygen Saturation % 91 %     5 Minute Liters of Oxygen 4 L     6 Minute Oxygen Saturation % 89 %     6 Minute Liters of Oxygen 4 L     2 Minute Post Oxygen Saturation % 98 %     2 Minute Post Liters of Oxygen 4 L  Oxygen Initial Assessment:  Oxygen Initial Assessment - 02/15/21 1230      Home Oxygen   Home Oxygen Device Home Concentrator;E-Tanks    Sleep Oxygen Prescription Continuous    Liters per minute 2    Home Exercise Oxygen Prescription Continuous    Liters per minute 3    Home Resting Oxygen Prescription None    Compliance with Home Oxygen Use Yes      Initial 6 min Walk   Oxygen Used Continuous    Liters per minute 4      Program Oxygen Prescription   Program Oxygen Prescription Continuous    Liters per minute 4    Comments SaO2 dropped to 86% on 2L, increased to 3L and oxygen saturation dropped to 87%, increased to 4L. Pt will need to be on 4L for exertion      Intervention   Short Term Goals To learn and exhibit compliance with exercise, home and travel O2 prescription;To learn and understand importance of monitoring SPO2 with pulse oximeter and demonstrate accurate use of the pulse oximeter.;To learn and understand importance of maintaining oxygen saturations>88%;To learn and demonstrate proper pursed lip breathing techniques or other breathing techniques.;To learn and  demonstrate proper use of respiratory medications    Long  Term Goals Exhibits compliance with exercise, home and travel O2 prescription;Verbalizes importance of monitoring SPO2 with pulse oximeter and return demonstration;Maintenance of O2 saturations>88%;Exhibits proper breathing techniques, such as pursed lip breathing or other method taught during program session;Compliance with respiratory medication;Demonstrates proper use of MDI's           Oxygen Re-Evaluation:   Oxygen Discharge (Final Oxygen Re-Evaluation):   Initial Exercise Prescription:  Initial Exercise Prescription - 02/15/21 1500      Date of Initial Exercise RX and Referring Provider   Date 02/15/21    Referring Provider Dr. Chase Caller    Expected Discharge Date 04/22/21      Oxygen   Oxygen Continuous    Liters 3-4      NuStep   Level 1    SPM 80    Minutes 30      Prescription Details   Frequency (times per week) 2    Duration Progress to 45 minutes of aerobic exercise without signs/symptoms of physical distress      Intensity   THRR 40-80% of Max Heartrate 60-120    Ratings of Perceived Exertion 11-13    Perceived Dyspnea 0-4      Progression   Progression Continue to progress workloads to maintain intensity without signs/symptoms of physical distress.      Resistance Training   Training Prescription Yes    Weight orange bands    Reps 10-15           Perform Capillary Blood Glucose checks as needed.  Exercise Prescription Changes:   Exercise Comments:   Exercise Goals and Review:  Exercise Goals    Row Name 02/15/21 1223             Exercise Goals   Increase Physical Activity Yes       Intervention Provide advice, education, support and counseling about physical activity/exercise needs.;Develop an individualized exercise prescription for aerobic and resistive training based on initial evaluation findings, risk stratification, comorbidities and participant's personal goals.        Expected Outcomes Short Term: Attend rehab on a regular basis to increase amount of physical activity.;Long Term: Add in home exercise to make exercise part of routine and to increase amount  of physical activity.;Long Term: Exercising regularly at least 3-5 days a week.       Increase Strength and Stamina Yes       Intervention Provide advice, education, support and counseling about physical activity/exercise needs.;Develop an individualized exercise prescription for aerobic and resistive training based on initial evaluation findings, risk stratification, comorbidities and participant's personal goals.       Expected Outcomes Short Term: Increase workloads from initial exercise prescription for resistance, speed, and METs.;Short Term: Perform resistance training exercises routinely during rehab and add in resistance training at home;Long Term: Improve cardiorespiratory fitness, muscular endurance and strength as measured by increased METs and functional capacity (6MWT)       Able to understand and use rate of perceived exertion (RPE) scale Yes       Intervention Provide education and explanation on how to use RPE scale       Expected Outcomes Short Term: Able to use RPE daily in rehab to express subjective intensity level;Long Term:  Able to use RPE to guide intensity level when exercising independently       Able to understand and use Dyspnea scale Yes       Intervention Provide education and explanation on how to use Dyspnea scale       Expected Outcomes Short Term: Able to use Dyspnea scale daily in rehab to express subjective sense of shortness of breath during exertion;Long Term: Able to use Dyspnea scale to guide intensity level when exercising independently       Knowledge and understanding of Target Heart Rate Range (THRR) Yes       Intervention Provide education and explanation of THRR including how the numbers were predicted and where they are located for reference       Expected Outcomes  Short Term: Able to state/look up THRR;Long Term: Able to use THRR to govern intensity when exercising independently;Short Term: Able to use daily as guideline for intensity in rehab       Understanding of Exercise Prescription Yes       Intervention Provide education, explanation, and written materials on patient's individual exercise prescription       Expected Outcomes Short Term: Able to explain program exercise prescription;Long Term: Able to explain home exercise prescription to exercise independently              Exercise Goals Re-Evaluation :   Discharge Exercise Prescription (Final Exercise Prescription Changes):   Nutrition:  Target Goals: Understanding of nutrition guidelines, daily intake of sodium <1553m, cholesterol <2097m calories 30% from fat and 7% or less from saturated fats, daily to have 5 or more servings of fruits and vegetables.  Biometrics:  Pre Biometrics - 02/15/21 1116      Pre Biometrics   Height _0  (1.626 m)    Weight 116.8 kg    BMI (Calculated) 44.18    Grip Strength 30 kg            Nutrition Therapy Plan and Nutrition Goals:   Nutrition Assessments:  MEDIFICTS Score Key:  ?70 Need to make dietary changes   40-70 Heart Healthy Diet  ? 40 Therapeutic Level Cholesterol Diet   Picture Your Plate Scores:  <4<21nhealthy dietary pattern with much room for improvement.  41-50 Dietary pattern unlikely to meet recommendations for good health and room for improvement.  51-60 More healthful dietary pattern, with some room for improvement.   >60 Healthy dietary pattern, although there may be some specific behaviors that could be  improved.    Nutrition Goals Re-Evaluation:   Nutrition Goals Discharge (Final Nutrition Goals Re-Evaluation):   Psychosocial: Target Goals: Acknowledge presence or absence of significant depression and/or stress, maximize coping skills, provide positive support system. Participant is able to verbalize  types and ability to use techniques and skills needed for reducing stress and depression.  Initial Review & Psychosocial Screening:  Initial Psych Review & Screening - 02/15/21 1121      Initial Review   Current issues with Current Anxiety/Panic   takes prozac for anxiety     Family Dynamics   Good Support System? Yes   neighbors     Barriers   Psychosocial barriers to participate in program There are no identifiable barriers or psychosocial needs.      Screening Interventions   Interventions Encouraged to exercise    Expected Outcomes Long Term goal: The participant improves quality of Life and PHQ9 Scores as seen by post scores and/or verbalization of changes           Quality of Life Scores:  Scores of 19 and below usually indicate a poorer quality of life in these areas.  A difference of  2-3 points is a clinically meaningful difference.  A difference of 2-3 points in the total score of the Quality of Life Index has been associated with significant improvement in overall quality of life, self-image, physical symptoms, and general health in studies assessing change in quality of life.  PHQ-9: Recent Review Flowsheet Data    Depression screen [facility] 2/9 02/15/2021 12/03/2020 04/29/2020 01/10/2020 04/05/2018   Decreased Interest 0 0 0 0 0   Down, Depressed, Hopeless 0 1 0 0 1   PHQ - 2 Score 0 1 0 0 1   Altered sleeping 1  - 3  - 0   Tired, decreased energy 1 - 0 - 1   Change in appetite 0 - 0 - 1   Feeling bad or failure about yourself  0 - 0 - 1   Trouble concentrating 0 - 3  - 2   Moving slowly or fidgety/restless 0 - 0 - 0   Suicidal thoughts 0 - 0 - 0   PHQ-9 Score - - 6 - 6   Difficult doing work/chores Not difficult at all - Somewhat difficult  - Not difficult at all     Interpretation of Total Score  Total Score Depression Severity:  1-4 = Minimal depression, 5-9 = Mild depression, 10-14 = Moderate depression, 15-19 = Moderately severe depression, 20-27 = Severe  depression   Psychosocial Evaluation and Intervention:  Psychosocial Evaluation - 02/15/21 1126      Psychosocial Evaluation & Interventions   Interventions Relaxation education;Stress management education    Continue Psychosocial Services  No Follow up required           Psychosocial Re-Evaluation:   Psychosocial Discharge (Final Psychosocial Re-Evaluation):   Education: Education Goals: Education classes will be provided on a weekly basis, covering required topics. Participant will state understanding/return demonstration of topics presented.  Learning Barriers/Preferences:  Learning Barriers/Preferences - 02/15/21 1127      Learning Barriers/Preferences   Learning Barriers None    Learning Preferences Computer/Internet;Written Material           Education Topics: Risk Factor Reduction:  -Group instruction that is supported by a PowerPoint presentation. Instructor discusses the definition of a risk factor, different risk factors for pulmonary disease, and how the heart and lungs work together.     Nutrition for  Pulmonary Patient:  -Group instruction provided by PowerPoint slides, verbal discussion, and written materials to support subject matter. The instructor gives an explanation and review of healthy diet recommendations, which includes a discussion on weight management, recommendations for fruit and vegetable consumption, as well as protein, fluid, caffeine, fiber, sodium, sugar, and alcohol. Tips for eating when patients are short of breath are discussed. Flowsheet Row PULMONARY REHAB OTHER RESPIRATORY from 02/08/2018 in [place]  Date 12/28/17  Educator edna      Pursed Lip Breathing:  -Group instruction that is supported by demonstration and informational handouts. Instructor discusses the benefits of pursed lip and diaphragmatic breathing and detailed demonstration on how to preform both.     Oxygen Safety:  -Group  instruction provided by PowerPoint, verbal discussion, and written material to support subject matter. There is an overview of "What is Oxygen" and "Why do we need it".  Instructor also reviews how to create a safe environment for oxygen use, the importance of using oxygen as prescribed, and the risks of noncompliance. There is a brief discussion on traveling with oxygen and resources the patient may utilize. Flowsheet Row PULMONARY REHAB OTHER RESPIRATORY from 02/08/2018 in [place]  Date 02/01/18  Educator RN  Instruction Review Code 2- Demonstrated Understanding      Oxygen Equipment:  -Group instruction provided by [facility] Staff utilizing handouts, written materials, and equipment demonstrations. Flowsheet Row PULMONARY REHAB OTHER RESPIRATORY from 02/08/2018 in [place]  Date 02/08/18  Educator [place]  Instruction Review Code 2- Demonstrated Understanding      Signs and Symptoms:  -Group instruction provided by written material and verbal discussion to support subject matter. Warning signs and symptoms of infection, stroke, and heart attack are reviewed and when to call the physician/911 reinforced. Tips for preventing the spread of infection discussed.   Advanced Directives:  -Group instruction provided by verbal instruction and written material to support subject matter. Instructor reviews Advanced Directive laws and proper instruction for filling out document.   Pulmonary Video:  -Group video education that reviews the importance of medication and oxygen compliance, exercise, good nutrition, pulmonary hygiene, and pursed lip and diaphragmatic breathing for the pulmonary patient.   Exercise for the Pulmonary Patient:  -Group instruction that is supported by a PowerPoint presentation. Instructor discusses benefits of exercise, core components of exercise, frequency, duration, and intensity of an exercise  routine, importance of utilizing pulse oximetry during exercise, safety while exercising, and options of places to exercise outside of rehab.     Pulmonary Medications:  -Verbally interactive group education provided by instructor with focus on inhaled medications and proper administration.   Anatomy and Physiology of the Respiratory System and Intimacy:  -Group instruction provided by PowerPoint, verbal discussion, and written material to support subject matter. Instructor reviews respiratory cycle and anatomical components of the respiratory system and their functions. Instructor also reviews differences in obstructive and restrictive respiratory diseases with examples of each. Intimacy, Sex, and Sexuality differences are reviewed with a discussion on how relationships can change when diagnosed with pulmonary disease. Common sexual concerns are reviewed. Flowsheet Row PULMONARY REHAB OTHER RESPIRATORY from 02/08/2018 in [place]  Date 12/14/17  Educator RN      MD DAY -A group question and answer session with a medical doctor that allows participants to ask questions that relate to their pulmonary disease state. Flowsheet Row PULMONARY REHAB OTHER RESPIRATORY from  02/08/2018 in [place]  Date 01/04/18  Educator [place] -Group or individual verbal, written, or video instructions that support the educational goals of the pulmonary rehab program.   Holiday Eating Survival Tips:  -Group instruction provided by PowerPoint slides, verbal discussion, and written materials to support subject matter. The instructor gives patients tips, tricks, and techniques to help them not only survive but enjoy the holidays despite the onslaught of food that accompanies the holidays.   Knowledge Questionnaire Score:  Knowledge Questionnaire Score - 02/15/21 1137      Knowledge Questionnaire Score   Pre Score 17/18            Core Components/Risk Factors/Patient Goals at Admission:  Personal Goals and Risk Factors at Admission - 02/15/21 1127      Core Components/Risk Factors/Patient Goals on Admission    Weight Management Obesity    Improve shortness of breath with ADL's Yes    Intervention Provide education, individualized exercise plan and daily activity instruction to help decrease symptoms of SOB with activities of daily living.    Expected Outcomes Short Term: Improve cardiorespiratory fitness to achieve a reduction of symptoms when performing ADLs;Long Term: Be able to perform more ADLs without symptoms or delay the onset of symptoms           Core Components/Risk Factors/Patient Goals Review:   Goals and Risk Factor Review    Row Name 02/15/21 1137             Core Components/Risk Factors/Patient Goals Review   Personal Goals Review Weight Management/Obesity;Develop more efficient breathing techniques such as purse lipped breathing and diaphragmatic breathing and practicing self-pacing with activity.;Increase knowledge of respiratory medications and ability to use respiratory devices properly.;Improve shortness of breath with ADL's              Core Components/Risk Factors/Patient Goals at Discharge (Final Review):   Goals and Risk Factor Review - 02/15/21 1137      Core Components/Risk Factors/Patient Goals Review   Personal Goals Review Weight Management/Obesity;Develop more efficient breathing techniques such as purse lipped breathing and diaphragmatic breathing and practicing self-pacing with activity.;Increase knowledge of respiratory medications and ability to use respiratory devices properly.;Improve shortness of breath with ADL's           ITP Comments:   Comments:

## 2021-02-15 NOTE — Progress Notes (Signed)
Patty [name] 70 y.o. female Pulmonary Rehab Orientation Note This patient who was referred to Pulmonary rehab by Dr. Chase Caller with the diagnosis of interstitial lung disease arrived today in Cardiac and Pulmonary Rehab. She arrived ambulatory with normal gait. She does carry portable oxygen.  Per pt, she uses oxygen intermittently. Color good, skin warm and dry. Patient is oriented to time and place. Patient's medical history, psychosocial health, and medications reviewed. Psychosocial assessment reveals pt lives alone. Pt is currently retired. Pt hobbies include playing games online, keeping up with her friends on social media, and listening to audio books.  Pt does not exhibit signs of depression. PHQ2/9 score 0/2. Pt shows fair  coping skills with positive outlook . She does not have family nearby, but has neighbors that are there when she needs help or assistance.Will continue to monitor and evaluate progress toward psychosocial goal(s) of continued mental well-being while participating in pulmonary rehab. [name] does take prozac to help calm her down so she is able to go to sleep easily. Physical assessment reveals heart rate is tachycardic, breath sounds clear to auscultation, no wheezes, rales, or rhonchi. Grip strength equal, strong. Patient reports she does take medications as prescribed. Patient states she follows a diet prescribed by the [facility] [place]. The patient has been trying to lose weight through a healthy diet and exercise program.. She lost 30 pounds and then gained it back in the last year due to her metabolism slowing down and being put on metformin which she did not tolerate d/t her chron's disease. Patient's weight will be monitored closely. Demonstration and practice of PLB using pulse oximeter. Patient able to return demonstration satisfactorily. Safety and hand hygiene in the exercise area reviewed with patient. Patient voices understanding of the  information reviewed. Department expectations discussed with patient and achievable goals were set. The patient shows enthusiasm about attending the program and we look forward to working with this nice lady. The patient completed  a 6 min walk test today, 02/16/2020 and to begin exercise on Tuesday, February 23, 2021 in the 1:45 pm exercise slot.  [id number]

## 2021-02-19 ENCOUNTER — Other Ambulatory Visit (HOSPITAL_COMMUNITY)
Admission: RE | Admit: 2021-02-19 | Discharge: 2021-02-19 | Disposition: A | Discharge status: Home / Self Care | Payer: Medicare Other | Source: Ambulatory Visit | Attending: Internal Medicine | Admitting: Internal Medicine

## 2021-02-19 DIAGNOSIS — Z01812 Encounter for preprocedural laboratory examination: Secondary | ICD-10-CM | POA: Insufficient documentation

## 2021-02-19 DIAGNOSIS — Z20822 Contact with and (suspected) exposure to covid-19: Secondary | ICD-10-CM | POA: Insufficient documentation

## 2021-02-19 LAB — SARS CORONAVIRUS 2 (TAT 6-24 HRS): SARS Coronavirus 2: NEGATIVE

## 2021-02-22 ENCOUNTER — Ambulatory Visit (INDEPENDENT_AMBULATORY_CARE_PROVIDER_SITE_OTHER): Payer: Medicare Other | Admitting: Internal Medicine

## 2021-02-22 ENCOUNTER — Other Ambulatory Visit: Payer: Self-pay

## 2021-02-22 DIAGNOSIS — J849 Interstitial pulmonary disease, unspecified: Secondary | ICD-10-CM

## 2021-02-22 LAB — PULMONARY FUNCTION TEST
DL/VA % pred: 152 %
DL/VA: 6.36 ml/min/mmHg/L
DLCO cor % pred: 144 %
DLCO cor: 27.44 ml/min/mmHg
DLCO unc % pred: 79 %
DLCO unc: 15.05 ml/min/mmHg
FEF 25-75 Pre: 0.85 L/sec
FEF2575-%Pred-Pre: 45 %
FEV1-%Pred-Pre: 68 %
FEV1-Pre: 1.5 L
FEV1FVC-%Pred-Pre: 82 %
FEV6-%Pred-Pre: 85 %
FEV6-Pre: 2.38 L
FEV6FVC-%Pred-Pre: 104 %
FVC-%Pred-Pre: 81 %
FVC-Pre: 2.38 L
Pre FEV1/FVC ratio: 63 %
Pre FEV6/FVC Ratio: 100 %

## 2021-02-22 NOTE — Progress Notes (Signed)
Spirometry/DLCO performed today.

## 2021-02-23 ENCOUNTER — Encounter: Payer: Self-pay | Admitting: Internal Medicine

## 2021-02-23 ENCOUNTER — Encounter (HOSPITAL_COMMUNITY)
Admission: RE | Admit: 2021-02-23 | Discharge: 2021-02-23 | Disposition: A | Discharge status: Home / Self Care | Payer: Medicare Other | Source: Ambulatory Visit | Attending: Internal Medicine | Admitting: Internal Medicine

## 2021-02-23 ENCOUNTER — Ambulatory Visit (INDEPENDENT_AMBULATORY_CARE_PROVIDER_SITE_OTHER): Payer: Medicare Other | Admitting: Internal Medicine

## 2021-02-23 VITALS — BP 118/72 | HR 123 | Temp 97.7°F | Ht 64.0 in | Wt 255.4 lb

## 2021-02-23 DIAGNOSIS — G4733 Obstructive sleep apnea (adult) (pediatric): Secondary | ICD-10-CM

## 2021-02-23 DIAGNOSIS — J849 Interstitial pulmonary disease, unspecified: Secondary | ICD-10-CM | POA: Diagnosis not present

## 2021-02-23 DIAGNOSIS — Z9989 Dependence on other enabling machines and devices: Secondary | ICD-10-CM | POA: Diagnosis not present

## 2021-02-23 DIAGNOSIS — I2723 Pulmonary hypertension due to lung diseases and hypoxia: Secondary | ICD-10-CM

## 2021-02-23 NOTE — Progress Notes (Signed)
Pulmonary Individual Treatment Plan  Patient Details  Name: Patty [name] MRN: [id number] Date of Birth: [birth year] Referring Provider:   April Manson Pulmonary Rehab Walk Test from 02/15/2021 in [place]  Referring Provider Dr. Chase Caller      Initial Encounter Date:  Flowsheet Row Pulmonary Rehab Walk Test from 02/15/2021 in [place]  Date 02/15/21      Visit Diagnosis: Interstitial lung disease ([place])  Patient's Home Medications on Admission:   Current Outpatient Medications:  .  amoxicillin (AMOXIL) 500 MG capsule, Take 2,000 mg by mouth See admin instructions. Before dental procedures, Disp: , Rfl:  .  aspirin EC 81 MG tablet, Take 81 mg by mouth daily., Disp: , Rfl:  .  b complex vitamins capsule, Take 1 capsule by mouth once a week., Disp: , Rfl:  .  Calcium Carbonate-Vit D-Min (CALCIUM 1200) 1200-1000 MG-UNIT CHEW, Chew 1 tablet by mouth daily., Disp: , Rfl:  .  CANNABIDIOL PO, Take 1 Dose by mouth 2 (two) times daily as needed (anxiety/sleep.). CBD Oil, Disp: , Rfl:  .  cetirizine (ZYRTEC) 10 MG tablet, Take 10 mg by mouth daily as needed (sinus/allergies.)., Disp: , Rfl:  .  cetirizine-pseudoephedrine (ZYRTEC-D) 5-120 MG tablet, Take 1 tablet by mouth daily as needed (sinus headaches.)., Disp: , Rfl:  .  clonazePAM (KLONOPIN) 0.5 MG tablet, TAKE 0.5-1 TABLETS (0.25-0.5 MG TOTAL) BY MOUTH DAILY AS NEEDED (FOR ANXIETY OR SLEEP.). (Patient taking differently: Take 0.25-0.5 mg by mouth daily as needed for anxiety (for anxiety or sleep.).), Disp: 90 tablet, Rfl: 1 .  Cyanocobalamin (VITAMIN B-12 IJ), Inject 1,000 mcg as directed every 30 (thirty) days., Disp: , Rfl:  .  FLUoxetine (PROZAC) 20 MG capsule, Take 1 capsule (20 mg total) by mouth daily., Disp: 90 capsule, Rfl: 3 .  fluticasone (FLONASE) 50 MCG/ACT nasal spray, Place 1-2 sprays into both nostrils daily as needed for allergies or rhinitis., Disp: ,  Rfl:  .  furosemide (LASIX) 20 MG tablet, Take 1 tablet (20 mg total) by mouth as needed for fluid., Disp: 90 tablet, Rfl: 3 .  Glycerin-Polysorbate 80 (REFRESH DRY EYE THERAPY OP), Place 1 drop into both eyes daily., Disp: , Rfl:  .  ibuprofen (ADVIL) 200 MG tablet, Take 400-800 mg by mouth every 8 (eight) hours as needed (pain.)., Disp: , Rfl:  .  mesalamine (PENTASA) 250 MG CR capsule, Take 250-500 mg by mouth 4 (four) times daily as needed (crohn's disease flare ups)., Disp: , Rfl:  .  Multiple Vitamins-Minerals (ICAPS AREDS 2 PO), Take 1 tablet by mouth daily., Disp: , Rfl:  .  prednisoLONE acetate (PRED FORTE) 1 % ophthalmic suspension, Place 1 drop into both eyes 2 (two) times daily as needed (Uveitis)., Disp: , Rfl:  .  Probiotic Product (PROBIOTIC PO), Take 1 capsule by mouth 2 (two) times a week. As needed, Disp: , Rfl:  .  traMADol (ULTRAM) 50 MG tablet, Take 1 tablet (50 mg total) by mouth every 6 (six) hours as needed., Disp: 20 tablet, Rfl: 0 No current facility-administered medications for this encounter.  Facility-Administered Medications Ordered in Other Encounters:  .  6 CHG cloth bath night before surgery, , , Once **AND** 6 CHG cloth bath AM of surgery, , , Once **AND** Chlorhexidine Gluconate Cloth 2 % PADS 6 each, 6 each, Topical, Once **AND** Chlorhexidine Gluconate Cloth 2 % PADS 6 each, 6 each, Topical, Once, Coralie Keens, MD  Past Medical History:  Past Medical History:  Diagnosis Date  . Abnormal nuclear stress test    a. 07/2017: cath with no CAD  . Amyloidosis ([place])   . Anxiety   . Arthritis   . Crohn disease ([place])   . Crohn's colitis ([place])   . DVT (deep venous thrombosis) ([place])   . Dyspnea   . Grade I diastolic dysfunction   . Hyperlipidemia   . ILD (interstitial lung disease) ([place])   . OSA on CPAP    CPAP    AND O2  . Pernicious anemia   . Polycythemia vera ([place])   . Pulmonary embolism ([place]) 2010   s/p hip surgery , reports this was never truly  confirmed   . Sleep disorder 01/10/2020   Uses klonopin for sleep since 2014; sleep walking. And anxiety    Tobacco Use: Social History   Tobacco Use  Smoking Status Former Smoker  . Packs/day: 1.00  . Years: 45.00  . Pack years: 45.00  . Types: Cigarettes  . Quit date: 12/26/2008  . Years since quitting: 12.1  Smokeless Tobacco Never Used    Labs: Recent Chemical engineer    Labs for ITP Cardiac and Pulmonary Rehab Latest Ref Rng & Units 09/09/2020 12/07/2020 12/07/2020 12/07/2020 02/01/2021   Cholestrol 0 - 200 mg/dL - - - - 190   LDLCALC 0 - 99 mg/dL - - - - 120(H)   HDL >39.00 mg/dL - - - - 55.60   Trlycerides 0.0 - 149.0 mg/dL - - - - 75.0   Hemoglobin A1c 4.6 - 6.5 % 6.3(H) - - - 6.4   HCO3 20.0 - 28.0 mmol/L - 27.0 26.9 26.6 -   TCO2 22 - 32 mmol/L - _0 -   O2SAT % - 62.0 62.0 64.0 -      Capillary Blood Glucose: Lab Results  Component Value Date   GLUCAP 123 (H) 01/28/2021     Pulmonary Assessment Scores:  Pulmonary Assessment Scores    Row Name 02/15/21 1140 02/15/21 1150 02/15/21 1523     ADL UCSD   ADL Phase Entry Entry Entry   SOB Score total 39 39 -     CAT Score   CAT Score 9 9 -     mMRC Score   mMRC Score - - 3         UCSD: Self-administered rating of dyspnea associated with activities of daily living (ADLs) 6-point scale (0 = "not at all" to 5 = "maximal or unable to do because of breathlessness")  Scoring Scores range from 0 to 120.  Minimally important difference is 5 units  CAT: CAT can identify the health impairment of COPD patients and is better correlated with disease progression.  CAT has a scoring range of zero to 40. The CAT score is classified into four groups of low (less than 10), medium (10 - 20), high (21-30) and very high (31-40) based on the impact level of disease on health status. A CAT score over 10 suggests significant symptoms.  A worsening CAT score could be explained by an exacerbation, poor medication  adherence, poor inhaler technique, or progression of COPD or comorbid conditions.  CAT MCID is 2 points  mMRC: mMRC (Modified Medical Research Council) Dyspnea Scale is used to assess the degree of baseline functional disability in patients of respiratory disease due to dyspnea. No minimal important difference is established. A decrease in score of 1 point or greater is considered a positive change.   Pulmonary  Function Assessment:  Pulmonary Function Assessment - 02/15/21 1120      Breath   Bilateral Breath Sounds Clear    Shortness of Breath Yes;Limiting activity           Exercise Target Goals: Exercise Program Goal: Individual exercise prescription set using results from initial 6 min walk test and THRR while considering  patient's activity barriers and safety.   Exercise Prescription Goal: Initial exercise prescription builds to 30-45 minutes a day of aerobic activity, 2-3 days per week.  Home exercise guidelines will be given to patient during program as part of exercise prescription that the participant will acknowledge.  Activity Barriers & Risk Stratification:  Activity Barriers & Cardiac Risk Stratification - 02/15/21 1115      Activity Barriers & Cardiac Risk Stratification   Activity Barriers Right Hip Replacement;Deconditioning;Muscular Weakness;Shortness of Breath           6 Minute Walk:  6 Minute Walk    Row Name 02/15/21 1224         6 Minute Walk   Phase Initial     Distance 715 feet     Walk Time 6 minutes     # of Rest Breaks 0     MPH 1.35     METS 1.81     RPE 14     Perceived Dyspnea  3     VO2 Peak 6.33     Symptoms Yes (comment)     Comments Oxygen saturation dropped below 88% twice and had to increase to 4L with exertion     Resting HR 99 bpm     Resting BP 102/70     Resting Oxygen Saturation  96 %     Exercise Oxygen Saturation  during 6 min walk 86 %     Max Ex. HR 136 bpm     Max Ex. BP 182/74     2 Minute Post BP 140/76            Interval HR   1 Minute HR 101     2 Minute HR 101     3 Minute HR 124     4 Minute HR 128     5 Minute HR 125     6 Minute HR 136     2 Minute Post HR 102     Interval Heart Rate? Yes           Interval Oxygen   Interval Oxygen? Yes     Baseline Oxygen Saturation % 96 %     1 Minute Oxygen Saturation % 91 %     1 Minute Liters of Oxygen 2 L     2 Minute Oxygen Saturation % 86 %     2 Minute Liters of Oxygen 2 L     3 Minute Oxygen Saturation % 92 %     3 Minute Liters of Oxygen 3 L     4 Minute Oxygen Saturation % 87 %     4 Minute Liters of Oxygen 3 L     5 Minute Oxygen Saturation % 91 %     5 Minute Liters of Oxygen 4 L     6 Minute Oxygen Saturation % 89 %     6 Minute Liters of Oxygen 4 L     2 Minute Post Oxygen Saturation % 98 %     2 Minute Post Liters of Oxygen 4 L  Oxygen Initial Assessment:  Oxygen Initial Assessment - 02/15/21 1230      Home Oxygen   Home Oxygen Device Home Concentrator;E-Tanks    Sleep Oxygen Prescription Continuous    Liters per minute 2    Home Exercise Oxygen Prescription Continuous    Liters per minute 3    Home Resting Oxygen Prescription None    Compliance with Home Oxygen Use Yes      Initial 6 min Walk   Oxygen Used Continuous    Liters per minute 4      Program Oxygen Prescription   Program Oxygen Prescription Continuous    Liters per minute 4    Comments SaO2 dropped to 86% on 2L, increased to 3L and oxygen saturation dropped to 87%, increased to 4L. Pt will need to be on 4L for exertion      Intervention   Short Term Goals To learn and exhibit compliance with exercise, home and travel O2 prescription;To learn and understand importance of monitoring SPO2 with pulse oximeter and demonstrate accurate use of the pulse oximeter.;To learn and understand importance of maintaining oxygen saturations>88%;To learn and demonstrate proper pursed lip breathing techniques or other breathing techniques.;To learn and  demonstrate proper use of respiratory medications    Long  Term Goals Exhibits compliance with exercise, home and travel O2 prescription;Verbalizes importance of monitoring SPO2 with pulse oximeter and return demonstration;Maintenance of O2 saturations>88%;Exhibits proper breathing techniques, such as pursed lip breathing or other method taught during program session;Compliance with respiratory medication;Demonstrates proper use of MDI's           Oxygen Re-Evaluation:   Oxygen Discharge (Final Oxygen Re-Evaluation):   Initial Exercise Prescription:  Initial Exercise Prescription - 02/15/21 1500      Date of Initial Exercise RX and Referring Provider   Date 02/15/21    Referring Provider Dr. Chase Caller    Expected Discharge Date 04/22/21      Oxygen   Oxygen Continuous    Liters 3-4      NuStep   Level 1    SPM 80    Minutes 30      Prescription Details   Frequency (times per week) 2    Duration Progress to 45 minutes of aerobic exercise without signs/symptoms of physical distress      Intensity   THRR 40-80% of Max Heartrate 60-120    Ratings of Perceived Exertion 11-13    Perceived Dyspnea 0-4      Progression   Progression Continue to progress workloads to maintain intensity without signs/symptoms of physical distress.      Resistance Training   Training Prescription Yes    Weight orange bands    Reps 10-15           Perform Capillary Blood Glucose checks as needed.  Exercise Prescription Changes:   Exercise Comments:   Exercise Goals and Review:  Exercise Goals    Row Name 02/15/21 1223             Exercise Goals   Increase Physical Activity Yes       Intervention Provide advice, education, support and counseling about physical activity/exercise needs.;Develop an individualized exercise prescription for aerobic and resistive training based on initial evaluation findings, risk stratification, comorbidities and participant's personal goals.        Expected Outcomes Short Term: Attend rehab on a regular basis to increase amount of physical activity.;Long Term: Add in home exercise to make exercise part of routine and to increase amount  of physical activity.;Long Term: Exercising regularly at least 3-5 days a week.       Increase Strength and Stamina Yes       Intervention Provide advice, education, support and counseling about physical activity/exercise needs.;Develop an individualized exercise prescription for aerobic and resistive training based on initial evaluation findings, risk stratification, comorbidities and participant's personal goals.       Expected Outcomes Short Term: Increase workloads from initial exercise prescription for resistance, speed, and METs.;Short Term: Perform resistance training exercises routinely during rehab and add in resistance training at home;Long Term: Improve cardiorespiratory fitness, muscular endurance and strength as measured by increased METs and functional capacity (6MWT)       Able to understand and use rate of perceived exertion (RPE) scale Yes       Intervention Provide education and explanation on how to use RPE scale       Expected Outcomes Short Term: Able to use RPE daily in rehab to express subjective intensity level;Long Term:  Able to use RPE to guide intensity level when exercising independently       Able to understand and use Dyspnea scale Yes       Intervention Provide education and explanation on how to use Dyspnea scale       Expected Outcomes Short Term: Able to use Dyspnea scale daily in rehab to express subjective sense of shortness of breath during exertion;Long Term: Able to use Dyspnea scale to guide intensity level when exercising independently       Knowledge and understanding of Target Heart Rate Range (THRR) Yes       Intervention Provide education and explanation of THRR including how the numbers were predicted and where they are located for reference       Expected Outcomes  Short Term: Able to state/look up THRR;Long Term: Able to use THRR to govern intensity when exercising independently;Short Term: Able to use daily as guideline for intensity in rehab       Understanding of Exercise Prescription Yes       Intervention Provide education, explanation, and written materials on patient's individual exercise prescription       Expected Outcomes Short Term: Able to explain program exercise prescription;Long Term: Able to explain home exercise prescription to exercise independently              Exercise Goals Re-Evaluation :  Exercise Goals Re-Evaluation    [place] Name 02/22/21 1445             Exercise Goal Re-Evaluation   Exercise Goals Review Increase Physical Activity;Increase Strength and Stamina;Able to understand and use rate of perceived exertion (RPE) scale;Able to understand and use Dyspnea scale;Knowledge and understanding of Target Heart Rate Range (THRR);Understanding of Exercise Prescription       Comments Patient's first day of exercise is this week. It is too early to note any progression. Will continue to monitor and progress as she is able.       Expected Outcomes Through exercise at rehab and home the patient will decrease shortness of breath with daily activities and feel confident in carrying out an exercise regimn at home.              Discharge Exercise Prescription (Final Exercise Prescription Changes):   Nutrition:  Target Goals: Understanding of nutrition guidelines, daily intake of sodium <1525m, cholesterol <2029m calories 30% from fat and 7% or less from saturated fats, daily to have 5 or more servings of fruits and vegetables.  Biometrics:  Pre Biometrics - 02/15/21 1116      Pre Biometrics   Height _0  (1.626 m)    Weight 116.8 kg    BMI (Calculated) 44.18    Grip Strength 30 kg            Nutrition Therapy Plan and Nutrition Goals:   Nutrition Assessments:  MEDIFICTS Score Key:  ?70 Need to make dietary  changes   40-70 Heart Healthy Diet  ? 40 Therapeutic Level Cholesterol Diet   Picture Your Plate Scores:  <41 Unhealthy dietary pattern with much room for improvement.  41-50 Dietary pattern unlikely to meet recommendations for good health and room for improvement.  51-60 More healthful dietary pattern, with some room for improvement.   >60 Healthy dietary pattern, although there may be some specific behaviors that could be improved.    Nutrition Goals Re-Evaluation:   Nutrition Goals Discharge (Final Nutrition Goals Re-Evaluation):   Psychosocial: Target Goals: Acknowledge presence or absence of significant depression and/or stress, maximize coping skills, provide positive support system. Participant is able to verbalize types and ability to use techniques and skills needed for reducing stress and depression.  Initial Review & Psychosocial Screening:  Initial Psych Review & Screening - 02/15/21 1121      Initial Review   Current issues with Current Anxiety/Panic   takes prozac for anxiety     Family Dynamics   Good Support System? Yes   neighbors     Barriers   Psychosocial barriers to participate in program There are no identifiable barriers or psychosocial needs.      Screening Interventions   Interventions Encouraged to exercise    Expected Outcomes Long Term goal: The participant improves quality of Life and PHQ9 Scores as seen by post scores and/or verbalization of changes           Quality of Life Scores:  Scores of 19 and below usually indicate a poorer quality of life in these areas.  A difference of  2-3 points is a clinically meaningful difference.  A difference of 2-3 points in the total score of the Quality of Life Index has been associated with significant improvement in overall quality of life, self-image, physical symptoms, and general health in studies assessing change in quality of life.  PHQ-9: Recent Review Flowsheet Data    Depression screen  [facility] 2/9 02/15/2021 12/03/2020 04/29/2020 01/10/2020 04/05/2018   Decreased Interest 0 0 0 0 0   Down, Depressed, Hopeless 0 1 0 0 1   PHQ - 2 Score 0 1 0 0 1   Altered sleeping 1  - 3  - 0   Tired, decreased energy 1 - 0 - 1   Change in appetite 0 - 0 - 1   Feeling bad or failure about yourself  0 - 0 - 1   Trouble concentrating 0 - 3  - 2   Moving slowly or fidgety/restless 0 - 0 - 0   Suicidal thoughts 0 - 0 - 0   PHQ-9 Score - - 6 - 6   Difficult doing work/chores Not difficult at all - Somewhat difficult  - Not difficult at all     Interpretation of Total Score  Total Score Depression Severity:  1-4 = Minimal depression, 5-9 = Mild depression, 10-14 = Moderate depression, 15-19 = Moderately severe depression, 20-27 = Severe depression   Psychosocial Evaluation and Intervention:  Psychosocial Evaluation - 02/15/21 1126      Psychosocial Evaluation & Interventions  Interventions Relaxation education;Stress management education    Continue Psychosocial Services  No Follow up required           Psychosocial Re-Evaluation:  Psychosocial Re-Evaluation    [place] Name 02/22/21 1349 02/22/21 1350           Psychosocial Re-Evaluation   Current issues with Current Anxiety/Panic Current Anxiety/Panic;Current Sleep Concerns      Comments - Will start the program this week, no change in psychosocial since orientation/walk test.      Expected Outcomes - That Patty [name] will be able to sleep better when she exercises on a regular basis, and handle her stress in healthy ways.      Interventions - Encouraged to attend Pulmonary Rehabilitation for the exercise;Relaxation education;Stress management education      Continue Psychosocial Services  - Follow up required by staff             Psychosocial Discharge (Final Psychosocial Re-Evaluation):  Psychosocial Re-Evaluation - 02/22/21 1350      Psychosocial Re-Evaluation   Current issues with Current Anxiety/Panic;Current Sleep Concerns     Comments Will start the program this week, no change in psychosocial since orientation/walk test.    Expected Outcomes That Patty [name] will be able to sleep better when she exercises on a regular basis, and handle her stress in healthy ways.    Interventions Encouraged to attend Pulmonary Rehabilitation for the exercise;Relaxation education;Stress management education    Continue Psychosocial Services  Follow up required by staff           Education: Education Goals: Education classes will be provided on a weekly basis, covering required topics. Participant will state understanding/return demonstration of topics presented.  Learning Barriers/Preferences:  Learning Barriers/Preferences - 02/15/21 1127      Learning Barriers/Preferences   Learning Barriers None    Learning Preferences Computer/Internet;Written Material           Education Topics: Risk Factor Reduction:  -Group instruction that is supported by a PowerPoint presentation. Instructor discusses the definition of a risk factor, different risk factors for pulmonary disease, and how the heart and lungs work together.     Nutrition for Pulmonary Patient:  -Group instruction provided by PowerPoint slides, verbal discussion, and written materials to support subject matter. The instructor gives an explanation and review of healthy diet recommendations, which includes a discussion on weight management, recommendations for fruit and vegetable consumption, as well as protein, fluid, caffeine, fiber, sodium, sugar, and alcohol. Tips for eating when patients are short of breath are discussed. Flowsheet Row PULMONARY REHAB OTHER RESPIRATORY from 02/08/2018 in [place]  Date 12/28/17  Educator edna      Pursed Lip Breathing:  -Group instruction that is supported by demonstration and informational handouts. Instructor discusses the benefits of pursed lip and diaphragmatic breathing and detailed  demonstration on how to preform both.     Oxygen Safety:  -Group instruction provided by PowerPoint, verbal discussion, and written material to support subject matter. There is an overview of "What is Oxygen" and "Why do we need it".  Instructor also reviews how to create a safe environment for oxygen use, the importance of using oxygen as prescribed, and the risks of noncompliance. There is a brief discussion on traveling with oxygen and resources the patient may utilize. Flowsheet Row PULMONARY REHAB OTHER RESPIRATORY from 02/08/2018 in [place]  Date 02/01/18  Educator RN  Instruction Review Code 2- Demonstrated Understanding  Oxygen Equipment:  -Group instruction provided by [facility] Staff utilizing handouts, written materials, and equipment demonstrations. Flowsheet Row PULMONARY REHAB OTHER RESPIRATORY from 02/08/2018 in [place]  Date 02/08/18  Educator [facility]  Instruction Review Code 2- Demonstrated Understanding      Signs and Symptoms:  -Group instruction provided by written material and verbal discussion to support subject matter. Warning signs and symptoms of infection, stroke, and heart attack are reviewed and when to call the physician/911 reinforced. Tips for preventing the spread of infection discussed.   Advanced Directives:  -Group instruction provided by verbal instruction and written material to support subject matter. Instructor reviews Advanced Directive laws and proper instruction for filling out document.   Pulmonary Video:  -Group video education that reviews the importance of medication and oxygen compliance, exercise, good nutrition, pulmonary hygiene, and pursed lip and diaphragmatic breathing for the pulmonary patient.   Exercise for the Pulmonary Patient:  -Group instruction that is supported by a PowerPoint presentation. Instructor discusses benefits of exercise, core  components of exercise, frequency, duration, and intensity of an exercise routine, importance of utilizing pulse oximetry during exercise, safety while exercising, and options of places to exercise outside of rehab.     Pulmonary Medications:  -Verbally interactive group education provided by instructor with focus on inhaled medications and proper administration.   Anatomy and Physiology of the Respiratory System and Intimacy:  -Group instruction provided by PowerPoint, verbal discussion, and written material to support subject matter. Instructor reviews respiratory cycle and anatomical components of the respiratory system and their functions. Instructor also reviews differences in obstructive and restrictive respiratory diseases with examples of each. Intimacy, Sex, and Sexuality differences are reviewed with a discussion on how relationships can change when diagnosed with pulmonary disease. Common sexual concerns are reviewed. Flowsheet Row PULMONARY REHAB OTHER RESPIRATORY from 02/08/2018 in [place]  Date 12/14/17  Educator RN      MD DAY -A group question and answer session with a medical doctor that allows participants to ask questions that relate to their pulmonary disease state. Flowsheet Row PULMONARY REHAB OTHER RESPIRATORY from 02/08/2018 in [place]  Date 01/04/18  Educator [place] -Group or individual verbal, written, or video instructions that support the educational goals of the pulmonary rehab program.   Holiday Eating Survival Tips:  -Group instruction provided by PowerPoint slides, verbal discussion, and written materials to support subject matter. The instructor gives patients tips, tricks, and techniques to help them not only survive but enjoy the holidays despite the onslaught of food that accompanies the holidays.   Knowledge Questionnaire Score:  Knowledge Questionnaire Score -  02/15/21 1137      Knowledge Questionnaire Score   Pre Score 17/18           Core Components/Risk Factors/Patient Goals at Admission:  Personal Goals and Risk Factors at Admission - 02/15/21 1127      Core Components/Risk Factors/Patient Goals on Admission    Weight Management Obesity    Improve shortness of breath with ADL's Yes    Intervention Provide education, individualized exercise plan and daily activity instruction to help decrease symptoms of SOB with activities of daily living.    Expected Outcomes Short Term: Improve cardiorespiratory fitness to achieve a reduction of symptoms when performing ADLs;Long Term: Be able to perform more ADLs without symptoms or delay the onset of symptoms  Core Components/Risk Factors/Patient Goals Review:   Goals and Risk Factor Review    Row Name 02/15/21 1137 02/22/21 1352           Core Components/Risk Factors/Patient Goals Review   Personal Goals Review Weight Management/Obesity;Develop more efficient breathing techniques such as purse lipped breathing and diaphragmatic breathing and practicing self-pacing with activity.;Increase knowledge of respiratory medications and ability to use respiratory devices properly.;Improve shortness of breath with ADL's Develop more efficient breathing techniques such as purse lipped breathing and diaphragmatic breathing and practicing self-pacing with activity.;Increase knowledge of respiratory medications and ability to use respiratory devices properly.;Improve shortness of breath with ADL's      Review - Has not started pulmonary rehab yet, will begin exercising 02/23/2021.      Expected Outcomes - See admission goals.             Core Components/Risk Factors/Patient Goals at Discharge (Final Review):   Goals and Risk Factor Review - 02/22/21 1352      Core Components/Risk Factors/Patient Goals Review   Personal Goals Review Develop more efficient breathing techniques such as purse lipped  breathing and diaphragmatic breathing and practicing self-pacing with activity.;Increase knowledge of respiratory medications and ability to use respiratory devices properly.;Improve shortness of breath with ADL's    Review Has not started pulmonary rehab yet, will begin exercising 02/23/2021.    Expected Outcomes See admission goals.           ITP Comments:   Comments: ITP REVIEW Pt is making expected progress toward pulmonary rehab goals after completing 0 sessions. Recommend continued exercise, life style modification, education, and utilization of breathing techniques to increase stamina and strength and decrease shortness of breath with exertion.

## 2021-02-23 NOTE — Patient Instructions (Addendum)
Chronic hypoxemic respiratory failure due to all of the below ILD (interstitial lung disease) ([place])   -  Decline in March 2020 through February 2022 on pulmonary function test but stable October 2020 through February 2022 on pulmonary function test -Stable on CT scan of the chest 2018 through July 2021 -Specific variety not known but given stability most likely this a non-- IPF varieties such as NSIP seen in women in your age group -Overall supportive care of plan given history of colitis [antifibrotic's can exacerbate this situation] and stability  Plan -Glad you are starting pulmonary rehabilitation -Continue oxygen with exertion and at night -   WHO group 3 pulmonary arterial hypertension ([place]) with elevaed PCWP to 20 on Dec 2021  -We discussed this in detail and given your general lack of preference for medications you have declined inhaler treprostinil at this point -We we discussed volunteering in clinical trial as a care option and to help progress with science -given your prior participation in clinical trials appreciate your interest.  We look at you for the PULSE study but at this point you do not qualify because you are starting pulmonary rehabilitation  Plan  -Take informed consent form for the pulse study to review -We can reassess again in few to several months -Lasix per cardiology    OSA on CPAP  -Continue CPAP per the advice of his sleep doctor  Follow-up -3 months clinical follow-up in a 30-minute slot with Dr. Chase Caller [symptom score and simple walking desaturation test at follow-up

## 2021-02-23 NOTE — Progress Notes (Signed)
_0  ID: Patty [name], female    DOB: [birth year], 70 y.o.   MRN: [id number]  Chief Complaint  Patient presents with  . Follow-up    dyspnea     Referring provider: No ref. provider found  HPI: 70 year old female former smoker seen for pulmonary consulokay guarded because then maybe they can meet him right now for the registryt 05/09/2017 for progressive dyspnea since 2004.[name] Patient has polycythemia vera followed by hematology and [place] She has Crohn's disease    IOV  05/09/2017  Chief Complaint  Patient presents with  . Pulmonary Consult    Pt referred for SOB with activity x years. Pt states over the last year the DOE has worsened. Pt denies cough and CP/tightness and f/c/s.     70 year old obese female. In 2004 Started noticing insidious onset of shortness of breath. In 2005 more [place] from [place] and probably gain over 60 pounds of weight. She has extensive workup at that time according to her history. She was then diagnosed with polycythemia by Dr. [name] at [facility]. She does not recollect any phlebotomies for this. However in the last 4-5 years she's had worsening shortness of breath. Noticeable with exertion particularly in the gym and doing standing exercises but not so much with sitting exercises. Relieved by rest. When she does some yard work she notices some associated wheezing as well.  Walking desaturation test in the office she did desaturate to 88% and did get tachycardic. She says that a chest x-ray done by primary care physicians interstitial pulmonary fibrosis. I personally visualized the chest x-ray done 04/03/2017: There is some interstitial markings but it is obscured by her obesity. I'm not so certain that is definite ILD. There is no lab work in the record. There is no PFT a CT scan available for visualization     05/29/2017 Follow up : Dyspnea  Patient returns for a two-week follow-up. Patient was seen for pulmonary consult  05/09/2017 for progressive dyspnea since 2004. Patient was set up for a high resolution CT chest that showed a very mild basilar predominant subpleural reticulation and bronchiectasis which could be due to nonspecific interstitial pneumonitis.. CT did have incidental findings of enlarged pulmonary arteries and aortic and coronary calcification. Patient had pulmonary function test on May 23 that showed an FEV1 at 77%, ratio 73, FVC 81, no significant bronchodilator response, total lung capacity 100%, DLCO 52%. Overnight oximetry test did show significant desaturations. We discussed beginning oxygen at bedtime.  Patient denies significant cough. Says that she get short of breath with walking. Has daytime fatigue and low energy..  Interstitial lung disease patient questionnaire was completed as follows Patient has no previous use of Macrodantin, amiodarone, methotrexate. She has been treated with prednisone for her Crohn's in the past. Patient has had extensive travel with brief visits to multiple state and country's.. [place]. Did live in a apartment that she did notice mold for 4 years. She does have Crohn's disease and was on Mesamaline for many years. Patient says she has had a humidifier and hot tub and previous house. She has no indoor birds. She does have a dog. Patient says she was told that she had a blood clot in the past but was never found.   OV 08/15/2017  Chief Complaint  Patient presents with  . Follow-up    Pt still gets occ. SOB. Other than that, pt states that she has been doing good.  Denies any cough or CP.     Follow-up multifactorial dyspnea with interstitial lung disease concern.   Regarding interstitial lung disease concern: Mid May 2018 she had high resolution CT chest that showed interstitial lung disease very mild but unclear if possible UIP pattern. Autoimmune test was negative. Isolated reduction in diffusion capacity 50%. [employer] of chest  physicians questionnaire shows previous Crohn's disease and also mold exposure previously. Overall she feels stable  Regarding dyspnea: She underwent echocardiogram in June 2018 showed grade 1 diastolic dysfunction. Had abnormal cardiac stress test July 2018 followed by cardiac cath early August 2018 that showed normal coronary angiogram but elevated left ventricular end-diastolic pressure. Dietary therapy has been recommended but she is unaware of these results.  Overall she is here to discuss these results.  IMPRESSION: 1. Suspect very mild basilar predominant subpleural reticulation and bronchiolectasis, which may be due to nonspecific interstitial pneumonitis. 2. Aortic atherosclerosis (ICD10-170.0). Coronary artery calcification. 3. Enlarged pulmonary arteries, indicative of pulmonary arterial hypertension.   Electronically Signed   By: Lorin Picket M.D.   On: 05/13/2017 07:33   OV 10/17/2017  Chief Complaint  Patient presents with  . Follow-up    Pt states that she has had good days and bad days since last visit. States that when she was in [place] for a month, breathing was better; still became SOB but not as bad as in [place]. Pt's SOB is mainly on exertion. Denies any cough or CP.    Follow-up dyspnea that is multifactorial due to obesity, physical deconditioning and diastolic dysfunction Follow-up mild interstitial lung disease not otherwise specified  Last visit I started her on Lasix. I was only supposed to see herin 6 months or so. However she's had problems tolerating Lasix and potassium. It fluctuates between palpitations and hypertension. She feels the palpitations might be related to potassium depletion after taking Lasix. But she also tells me that the Lasix does help her dyspnea. At this point in time her condition is to see cardiologist. Off note she did send the email message asking these questions on 09/28/2017 made a reply that she tells me that she never got  the reply.   OV 02/13/2018  Chief Complaint  Patient presents with  . Follow-up    PFT done 02/05/18.  Pt states she has been doing good. Was sick in january but states she is doing better.  DME: [facility] 4L pulse    Follow-up dyspnea that is multifactorial due to obesity, physical deconditioning and diastolic dysfunction Follow-up mild interstitial lung disease not otherwise specified v concern   70 year old female with concern for interstitial lung disease.  Since her last visit she continues to do well.  She is attending pulmonary rehabilitation.  She feels better and less short of breath.  She uses oxygen with exertion at rehab saying she needs it.  She has upcoming travel in summer to [place] and wanted some flight information oxygen form filled out.  She is wondering if mesalamine that she took several years ago was the cause of interstitial lung disease or her Crohn's disease.  But overall she is stable.  Pulmonary function test shows stability and FVC since last 1 year with some improvement in DLCO.  High-resolution CT scan of the chest interpreted by thoracic radiology report suspected ILD.   OV 08/28/2018  Chief Complaint  Patient presents with  . Follow-up    PFT performed today.  Pt states she has had both good and bad days since last  visit. Pt denies any complaints of cough, SOB, or CP but states she is having some problems with congestion since she has been back from vacation. Pt has been having problems with getting O2 supplies taken care of with DME.    Follow-up dyspnea that is multifactorial due to obesity, physical deconditioning and diastolic dysfunction Follow-up concern for  mild interstitial lung disease not otherwise specified  Patty [name] - Presents for follow-up for the above issues. Her dyspnea significantly better following a 30 pound weight loss and continued rehabilitation. She uses oxygen with rehabilitation. Today walking desaturation test 185 feet 3 laps on  room air: She dropped to 87% on the second lap. But she is feeling better. She had spirometry today that shows improvement in FVC concomitant with weight loss but no change in diffusion capacity that reflects the presence of ongoing possible ILD.She continues to lose more weight she believes that she could lose another 30 pounds before the next visit.     OV 03/12/2019  Subjective:  Patient ID: Patty [name], female , DOB: 11-05-1951 , age 67 y.o. , MRN: [id number] , ADDRESS: [place] [id number]   03/12/2019 -   Chief Complaint  Patient presents with  . Follow-up    PFT performed today.  Pt states she has been doing good since last visit. States she still becomes SOB with exertion, and has phlegm in the mornings, postnasal drainage which has used flonase to help. Pt also uses POC as needed.    Follow-up dyspnea that is multifactorial due to obesity, physical deconditioning and diastolic dysfunction Follow-up  mild interstitial lung disease not otherwise specifieds -with stability 2018 through 2020 [non-IPF pattern]; on observation   HPI Symphany Fleissner 70 y.o. -presents for follow-up.  She is attending pulmonary rehabilitation and using oxygen with exertion.  Overall she feels stable.  Subjective symptom parameters and objective parameters are documented below.  She had high-resolution CT chest that showed shows that she has ILD.  At this point we can be confident that she has mild ILD but it is stable based on symptoms walking test and pulmonary function test.  The previous autoimmune test was negative.    OV 10/07/2019  Subjective:  Patient ID: [name], female , DOB: 05/30/1951 , age 55 y.o. , MRN: [id number] , ADDRESS: [place] [id number]   10/07/2019 -   Chief Complaint  Patient presents with  . Follow-up     Follow-up dyspnea that is multifactorial due to obesity, physical deconditioning and diastolic dysfunction Follow-up   mild interstitial lung disease not otherwise specifieds -with stability 2018 through 2020 [non-IPF pattern]; on observation Follow-up mild pulmonary emphysema present on high-resolution CT chest  HPI Augustina Braddock 70 y.o. -presents for follow-up.  Prior visit was pre-pandemic.  Since then she has been sedentary at home.  She tells me that she is more short of breath.  The symptom scores below reflect that.  She thinks is because of the 30 pound weight gain because of sedentary living and social isolation following the onset of the pandemic.  She is frustrated by this.  Deep down she thinks her ILD is stable.  Pulmonary function test shows a drop in FVC but stability and DLCO suggesting weight gain is an ongoing issue for worsening dyspnea and pulmonary function test.  She is willing to have a high-dose flu shot today.  She also tells me that she is not taking her Spiriva because it was expensive.  Although it did help her she is willing to try again and try to price it.  She also stated that her mom who never smoked had emphysema but her dad smoked heavily.  She has never been tested for alpha-1.   [employer] -dropped. But tries not to use it in public    IMPRESSION: 1. Very mild scattered basilar subpleural reticulation and ground-glass similar to 01/29/2018. Nonspecific interstitial pneumonitis can not be excluded. Findings are indeterminate for UIP per consensus guidelines: Diagnosis of Idiopathic Pulmonary Fibrosis: An Official ATS/ERS/JRS/ALAT Clinical Practice Guideline. [place], Iss 5, [id number], Aug 26 2017. 2. Question cirrhosis. 3. Aortic atherosclerosis (ICD10-170.0). Coronary artery calcification. 4. Enlarged arteries, arterial hypertension. 5.  Emphysema (ICD10-J43.9).   Electronically Signed   By: Lorin Picket M.D.   On: 03/06/2019 16:59   ROS - per HPI     OV 06/23/2020  Subjective:  Patient ID: [name], female  , DOB: 12/02/1951 , age 1 y.o. , MRN: [id number] , ADDRESS: [place] [id number]   06/23/2020 -   Chief Complaint  Patient presents with  . Follow-up    shortness of breath with exertion   Follow-up dyspnea that is multifactorial due to obesity, physical deconditioning and diastolic dysfunction  Follow-up  mild interstitial lung disease not otherwise specifieds -with stability 2018 through 2020 [non-IPF pattern/indeterminate   -; on observation  - last CT Mach 2020 0> July 2021 wthout progression  Follow-up mild pulmonary emphysema present on high-resolution CT chest - alpha 1 MM  - o2 with exertion  - advised spiriva oct 2020 but too expensive  HPI Dutchess Crosland 70 y.o. -returns for routine follow-up.  She says she has been doing well.  She uses portable oxygen cranks it up to 4 L - 5 L and then walks every day for 30 minutes.  Overall she feels stable.  Her symptom score itself is stable.  However on pulmonary function testing her FVC shows a decline.  This decline started in October 2020 and since then it is stable.  She attributes this to weight gain.  There is what she told me last time as well.  However when we walked her she seemed to desaturate much more easily.  We also needed to corrected desaturation at 5 L.  She thinks it is because the portable oxygen system is not delivering oxygen currently and is a technical issue with a portable machine.  However the distance of desaturation seems to worsen.  There is no leg swelling although she has some mild varicose veins at baseline.  She is not on nighttime oxygen and she recollects attest that this some years ago and was normal back then.  Currently she is only using portable oxygen.  Her last echocardiogram was few years ago.  There is no hemoptysis or worsening cough.  She got a Covid vaccine in February 2021 and she thinks things might of changed since then.   SYMPTOM SCALE - ILD 03/12/2019  10/07/2019 30# wt  gain  06/23/2020 250#  O2 use RA ra   Shortness of Breath 0 -> 5 scale with 5 being worst (score 6 If unable to do)    At rest 0 0 0  Simple tasks - showers, clothes change, eating, shaving 0 0   Household (dishes, doing bed, laundry) 0 0 0  Shopping 0 0 0  Walking at own pace 0 4.5 4.5  Walking up Stairs 1-2  5 5  Total (40 - 48) Dyspnea Score 2 9.5 9.5  How bad is your cough? 0 0 0  How bad is your fatigue 0 1 0  nausea   0  vomit   0  diarrhe   0  axniety   3  depression   0        Simple office walk 185 feet x  3 laps goal with forehead probe 03/12/2019  10/07/2019  06/23/2020   O2 used Room air Room air Room air  Number laps completed 3 2nd lap 1 lap  Comments about pace slow slow slow  Resting Pulse Ox/HR 100% and 85/min 96% and 72/min 97% and 77  Final Pulse Ox/HR 88% and 135/min 86% and 105/min 87% and 112/min  Desaturated </= 88% yes    Desaturated <= 3% points Yes, 12    Got Tachycardic >/= 90/min yes    Symptoms at end of test Mild dyspnea  Corrected with 5L [place]. Even with 4L [place] downt to  87%  Miscellaneous comments x  ? worse    Results for TIYANA, GALLA ANN (MRN [id number]) as of 06/23/2020 11:49  Ref. Range 05/17/2017 13:11 02/05/2018 12:14 08/28/2018 11:22 03/12/2019 14:47 10/01/2019 15:58 30 pound weight gain 06/09/2020 11:25  FVC-Pre Latest Units: L 2.61 2.57 2.80 2.73 2.38 2.42  FVC-%Pred-Pre Latest Units: % 84 83 94 91 81 82  FEV1-Pre Latest Units: L 1.83 1.71 1.96 1.83 1.65 1.53   Results for LILIE, VEZINA ANN (MRN [id number]) as of 06/23/2020 11:49  Ref. Range 05/17/2017 13:11 02/05/2018 12:14 08/28/2018 11:22 03/12/2019 14:47 10/01/2019 15:58 06/09/2020 11:25  DLCO unc Latest Units: ml/min/mmHg 12.64 15.15 14.53 13.77 14.39 13.48  DLCO unc % pred Latest Units: % 52 62 62 72 75 70    ROS - per HPI     OV 02/23/2021  Subjective:  Patient ID: [name], female , DOB: 1951-07-24 , age 54 y.o. , MRN: [id number] , ADDRESS: [place] [id number] PCP [name], MD Patient Care Team: [name], MD as PCP - General (Family Medicine) Pyrtle, Lajuan Lines, MD as Consulting Physician (Gastroenterology) Shamleffer, Melanie Crazier, MD as Consulting Physician (Endocrinology) Brand Males, MD as Consulting Physician (Pulmonary Disease)  This Provider for this visit: Treatment Team:  Attending Provider: Brand Males, MD   Follow-up dyspnea that is multifactorial due to obesity, physical deconditioning and diastolic dysfunction, ILD and new dx Group 3 PAH - dec 2021  45 pppd prior smoking hix  Follow-up  mild interstitial lung disease not otherwise specifieds -with stability 2018 through 2020 [non-IPF pattern/indeterminate   -; on observation  - last CT Mach 2020 - > July 2021 without progression  Inconsisent dxx of  pulmonary emphysema present on high-resolution CT chest - alpha 1 MM  - o2 with exertion  - advised spiriva oct 2020 but too expensive  Reported in only 1 CT. No mention in July 2021 CT  WHO group 3 Pulm htn with elevated PCWD - [facility] 12/07/20:  RA 13/12 mean 9 mmHg RV 48/8, EDP 12 PA 50/29 mean 38 PCWP 22/20 mean 20  O2 saturations: PA 62, Ao 97, SVC 64  CO 4.26 L/m, CI 1.96 Trans-pulmonic gradient 18 mmHg PVR 4.2 [place] Units  Grade 1 Disast dysfhn  - reportred July 2021 echo  Obesity  02/23/2021 -   Chief Complaint  Patient presents with  . Follow-up    Doing ok     HPI Naleah  Ardelle Anton 70 y.o. -returns for 1-monthfollow-up.  She has inconsistent diagnosis of emphysema.  Was reported in 1 scan but not another scans.  She has ILD.  She is foregoing biopsy.  She tells me overall that she is stable.  Although there are some weight gain.  She feels in the interim Metformin messed up her GI system and cause weight gain but she is glad to be out of it.  She had 6-minute walk test on 02/15/2021 at pulmonary rehabilitation.  She walked 7 and 15 feet in 6 minutes.   She did not break at all.  Her pulse ox dropped below 88% 2 times.  She corrected with 4 L of oxygen.  Her resting oxygen saturation was 96%.  And lowest was 86%.  She also had right heart catheterization in December 2021.  She has elevated pulmonary pressures but also elevated wedge.  Cardiology is following this up and she is on diuresis.  We spent some time discussing her care option.  She had pulmonary function test that shows stability in the last year and a half but declined in 2 years time.  Her high-resolution CT scan of the chest done in summer 2021 shows stability over 4 years.  She is not interested in antifibrotic's because of her colitis and also given her stability.  She does not want to go through pulmonary lung biopsy given the risks  She generally tries to avoid medications.  She is happy going to pulmonary rehabilitation.  We discussed inhaled treprostinil as approved therapy for pulmonary hypertension -expressed to her the overall safety profile for over 20 years and approved recently for pulmonary hypertension and WHO group 3.  Explained the minimal side effect risk but she does not want to go through this  We discussed the option of participating in a clinical trial.  We discussed what is called the PULSE inhaled nitric oxide device study.  This is a 28-week plus study.  It was a device and a nitric oxide supply study.  She is actually interested in this.  She is participating clinical trials.  She understands the voluntary nature of this.  She understands the risks that come with clinical trials.  She understands the control of risk through close follow-up and interventions..  However her 6-minute walk test is quite adequate and also she starting pulmonary rehabilitation so she would not be able to participate in this trial for several months.  In addition she is aware that inhaled treprostinil which is standard of care therapy is currently on exclusion in this protocol current  amendment.  She understands if she qualifies and chooses this trial that this would be a limitation.  We have given her the consent for review but at this time she does not qualify for this trial.    SYMPTOM SCALE - ILD 03/12/2019  10/07/2019 30# wt gain  06/23/2020 250# 02/23/2021 255#  O2 use RA ra    Shortness of Breath 0 -> 5 scale with 5 being worst (score 6 If unable to do)     At rest 0 0 0 0  Simple tasks - showers, clothes change, eating, shaving 0 0  0  Household (dishes, doing bed, laundry) 0 0 0 0  Shopping 0 0 0 0 with card  Walking at own pace 0 4.5 4.5 4  Walking up Stairs 1-_0 Total (40 - 48) Dyspnea Score 2 9.5 9.5 9  How bad is your cough? 0 0  0 0  How bad is your fatigue 0 1 0 3  nausea   0 0  vomit   0 0  diarrhe   0 0  axniety   3 2  depression   0 1        Simple office walk 185 feet x  3 laps goal with forehead probe 03/12/2019  10/07/2019  06/23/2020  02/23/2021   O2 used Room air Room air Room air Room air  Number laps completed 3 2nd lap 1 lap Walk from lobby to office  Comments about pace slow slow slow   Resting Pulse Ox/HR 100% and 85/min 96% and 72/min 97% and 77 91% room air at rest  Final Pulse Ox/HR 88% and 135/min 86% and 105/min 87% and 112/min 83%   Desaturated </= 88% yes     Desaturated <= 3% points Yes, 12     Got Tachycardic >/= 90/min yes     Symptoms at end of test Mild dyspnea  Corrected with 5L [place]. Even with 4L [place] downt to  87% 2L [place] at rest - corrected to 90%  Miscellaneous comments x  ? worse       PFT  PFT Results Latest Ref Rng & Units 02/22/2021 06/09/2020 10/01/2019 03/12/2019 08/28/2018 02/05/2018 05/17/2017  FVC-Pre L 2.38 2.42 2.38 2.73 2.80 2.57 2.61  FVC-Predicted Pre % 81 82 81 91 94 83 84  FVC-Post L - - - - - - 2.54  FVC-Predicted Post % - - - - - - 81  Pre FEV1/FVC % % 63 63 69 67 70 67 70  Post FEV1/FCV % % - - - - - - 73  FEV1-Pre L 1.50 1.53 1.65 1.83 1.96 1.71 1.83  FEV1-Predicted Pre % 68 68 74 80 86 72  77  FEV1-Post L - - - - - - 1.84  DLCO uncorrected ml/min/mmHg 15.05 13.48 14.39 13.77 14.53 15.15 12.64  DLCO UNC% % 79 70 75 72 62 62 52  DLCO corrected ml/min/mmHg 27.44 12.89 - - - - 12.31  DLCO COR %Predicted % 144 67 - - - - 50  DLVA Predicted % 152 70 75 72 65 65 60  TLC L - - - - - - 5.09  TLC % Predicted % - - - - - - 100  RV % Predicted % - - - - - - 115   HRCT July 2021   IMPRESSION: 1. Spectrum of findings suggestive of a mild basilar predominant fibrotic interstitial lung disease without appreciable interval progression since baseline 05/12/2017 high-resolution chest CT. Favor NSIP, with UIP not excluded. Findings are indeterminate for UIP per consensus guidelines: Diagnosis of Idiopathic Pulmonary Fibrosis: An Official ATS/ERS/JRS/ALAT Clinical Practice Guideline. [place], Iss 5, [phone], Aug 26 2017. 2. Dilated main pulmonary artery, stable, suggesting chronic pulmonary arterial hypertension. 3. Aortic Atherosclerosis (ICD10-I70.0).   Electronically Signed   By: Ilona Sorrel M.D.   On: 07/10/2020 15:50     has a past medical history of Abnormal nuclear stress test, Amyloidosis ([place]), Anxiety, Arthritis, Crohn disease ([place]), Crohn's colitis ([place]), DVT (deep venous thrombosis) ([place]), Dyspnea, Grade I diastolic dysfunction, Hyperlipidemia, ILD (interstitial lung disease) ([place]), OSA on CPAP, Pernicious anemia, Polycythemia vera ([place]), Pulmonary embolism ([place]) (2010), and Sleep disorder (01/10/2020).   reports that she quit smoking about 12 years ago. Her smoking use included cigarettes. She has a 45.00 pack-year smoking history. She has never used smokeless tobacco.  Past Surgical History:  Procedure Laterality Date  .  RIGHT BREAST LUMPECTOMY WITH RADIOACTIVE SEED LOCALIZATION (Right Breast)  02/04/2021  . BIOPSY  09/30/2019   Procedure: BIOPSY;  Surgeon: Jerene Bears, MD;  Location: [facility];  Service: Gastroenterology;;  .  BREAST LUMPECTOMY WITH RADIOACTIVE SEED LOCALIZATION Right 02/04/2021   Procedure: RIGHT BREAST LUMPECTOMY WITH RADIOACTIVE SEED LOCALIZATION;  Surgeon: Coralie Keens, MD;  Location: [facility];  Service: General;  Laterality: Right;  . CARDIAC CATHETERIZATION    . COLON RESECTION  1993  . COLONOSCOPY WITH PROPOFOL N/A 09/30/2019   Procedure: COLONOSCOPY WITH PROPOFOL;  Surgeon: Jerene Bears, MD;  Location: [facility];  Service: Gastroenterology;  Laterality: N/A;  . HIP ARTHROPLASTY Right    x 2, initial right THA, then subsequent right THA revision   . LEFT HEART CATH AND CORONARY ANGIOGRAPHY N/A 07/28/2017   Procedure: Left Heart Cath and Coronary Angiography;  Surgeon: Burnell Blanks, MD;  Location: [facility];  Service: Cardiovascular;  Laterality: N/A;  . POLYPECTOMY  09/30/2019   Procedure: POLYPECTOMY;  Surgeon: Jerene Bears, MD;  Location: [facility];  Service: Gastroenterology;;  . RIGHT HEART CATH N/A 12/07/2020   Procedure: RIGHT HEART CATH;  Surgeon: Sherren Mocha, MD;  Location: [facility];  Service: Cardiovascular;  Laterality: N/A;    Allergies  Allergen Reactions  . Victoza [Liraglutide] Shortness Of Breath  . Lasix [Furosemide] Itching    Hypersensitivity - pt can take as needed   . Metformin And Related Diarrhea    Increased moodiness  . Onion Other (See Comments)    sereve diarrhea, stomach pains (Crohn's flares)  . Potassium-Containing Compounds Other (See Comments)    Due to chron's, difficult passing though kidneys not allowing absorption in the body   . Prednisone Other (See Comments)    Altered mood  . Sulfa Drugs Cross Reactors Other (See Comments)    "feels like bugs crawling on me"    Immunization History  Administered Date(s) Administered  . Fluad Quad(high Dose 65+) 10/07/2019, 09/09/2020  . Influenza, High Dose Seasonal PF 08/28/2018  . Influenza-Unspecified 11/23/2017  . PFIZER(Purple Top)SARS-COV-2 Vaccination  02/16/2020, 03/17/2020, 12/14/2020  . Pneumococcal Conjugate-13 02/13/2018  . Pneumococcal Polysaccharide-23 03/12/2019    Family History  Problem Relation Age of Onset  . Glaucoma Mother   . High blood pressure Mother   . High Cholesterol Mother   . Sleep apnea Mother   . Diabetes Mellitus II Father   . Sleep apnea Father   . Anxiety disorder Father   . COPD Brother   . Heart disease Brother   . Non-Hodgkin's lymphoma Brother   . Diabetes Mellitus II Brother   . Glaucoma Brother   . Colon cancer Neg Hx   . Esophageal cancer Neg Hx   . Pancreatic cancer Neg Hx   . Stomach cancer Neg Hx   . Liver disease Neg Hx      Current Outpatient Medications:  .  amoxicillin (AMOXIL) 500 MG capsule, Take 2,000 mg by mouth See admin instructions. Before dental procedures, Disp: , Rfl:  .  aspirin EC 81 MG tablet, Take 81 mg by mouth daily., Disp: , Rfl:  .  b complex vitamins capsule, Take 1 capsule by mouth once a week., Disp: , Rfl:  .  Calcium Carbonate-Vit D-Min (CALCIUM 1200) 1200-1000 MG-UNIT CHEW, Chew 1 tablet by mouth daily., Disp: , Rfl:  .  CANNABIDIOL PO, Take 1 Dose by mouth 2 (two) times daily as needed (anxiety/sleep.).  CBD Oil, Disp: , Rfl:  .  cetirizine (ZYRTEC) 10 MG tablet, Take 10 mg by mouth daily as needed (sinus/allergies.)., Disp: , Rfl:  .  cetirizine-pseudoephedrine (ZYRTEC-D) 5-120 MG tablet, Take 1 tablet by mouth daily as needed (sinus headaches.)., Disp: , Rfl:  .  clonazePAM (KLONOPIN) 0.5 MG tablet, TAKE 0.5-1 TABLETS (0.25-0.5 MG TOTAL) BY MOUTH DAILY AS NEEDED (FOR ANXIETY OR SLEEP.). (Patient taking differently: Take 0.25-0.5 mg by mouth daily as needed for anxiety (for anxiety or sleep.).), Disp: 90 tablet, Rfl: 1 .  Cyanocobalamin (VITAMIN B-12 IJ), Inject 1,000 mcg as directed every 30 (thirty) days., Disp: , Rfl:  .  FLUoxetine (PROZAC) 20 MG capsule, Take 1 capsule (20 mg total) by mouth daily., Disp: 90 capsule, Rfl: 3 .  fluticasone (FLONASE) 50  MCG/ACT nasal spray, Place 1-2 sprays into both nostrils daily as needed for allergies or rhinitis., Disp: , Rfl:  .  furosemide (LASIX) 20 MG tablet, Take 1 tablet (20 mg total) by mouth as needed for fluid., Disp: 90 tablet, Rfl: 3 .  Glycerin-Polysorbate 80 (REFRESH DRY EYE THERAPY OP), Place 1 drop into both eyes daily., Disp: , Rfl:  .  ibuprofen (ADVIL) 200 MG tablet, Take 400-800 mg by mouth every 8 (eight) hours as needed (pain.)., Disp: , Rfl:  .  mesalamine (PENTASA) 250 MG CR capsule, Take 250-500 mg by mouth 4 (four) times daily as needed (crohn's disease flare ups)., Disp: , Rfl:  .  Multiple Vitamins-Minerals (ICAPS AREDS 2 PO), Take 1 tablet by mouth daily., Disp: , Rfl:  .  prednisoLONE acetate (PRED FORTE) 1 % ophthalmic suspension, Place 1 drop into both eyes 2 (two) times daily as needed (Uveitis)., Disp: , Rfl:  .  Probiotic Product (PROBIOTIC PO), Take 1 capsule by mouth 2 (two) times a week. As needed, Disp: , Rfl:  .  traMADol (ULTRAM) 50 MG tablet, Take 1 tablet (50 mg total) by mouth every 6 (six) hours as needed., Disp: 20 tablet, Rfl: 0 No current facility-administered medications for this visit.  Facility-Administered Medications Ordered in Other Visits:  .  6 CHG cloth bath night before surgery, , , Once **AND** 6 CHG cloth bath AM of surgery, , , Once **AND** Chlorhexidine Gluconate Cloth 2 % PADS 6 each, 6 each, Topical, Once **AND** Chlorhexidine Gluconate Cloth 2 % PADS 6 each, 6 each, Topical, Once, Coralie Keens, MD      Objective:   Vitals:   02/23/21 1102  BP: 118/72  Pulse: (!) 123  Temp: 97.7 F (36.5 C)  TempSrc: Oral  SpO2: (!) 83%  Weight: 255 lb 6.4 oz (115.8 kg)  Height: _0  (1.626 m)    Estimated body mass index is 43.84 kg/m as calculated from the following:   Height as of this encounter: _1  (1.626 m).   Weight as of this encounter: 255 lb 6.4 oz (115.8 kg).  _2 @  Filed Weights   02/23/21 1102  Weight: 255 lb 6.4  oz (115.8 kg)     Physical ExamGeneral: No distress.  Obese Neuro: Alert and Oriented x 3. GCS 15. Speech normal Psych: Pleasant Resp:  Barrel Chest - no.  Wheeze - no, Crackles -yes at the base, No overt respiratory distress CVS: Normal heart sounds. Murmurs - no Ext: Stigmata of Connective Tissue Disease - no HEENT: Normal upper airway. PEERL +. No post nasal drip        Assessment:       ICD-10-CM   1. ILD (  interstitial lung disease) ([place])  J84.9   2. WHO group 3 pulmonary arterial hypertension (HCC)  I27.23   3. OSA on CPAP  G47.33    Z99.89        Plan:     Patient Instructions  Chronic hypoxemic respiratory failure due to all of the below ILD (interstitial lung disease) ([place])   -  Decline in March 2020 through February 2022 on pulmonary function test but stable October 2020 through February 2022 on pulmonary function test -Stable on CT scan of the chest 2018 through July 2021 -Specific variety not known but given stability most likely this a non-- IPF varieties such as NSIP seen in women in your age group -Overall supportive care of plan given history of colitis [antifibrotic's can exacerbate this situation] and stability  Plan -Glad you are starting pulmonary rehabilitation -Continue oxygen with exertion and at night -   WHO group 3 pulmonary arterial hypertension ([place]) with elevaed PCWP to 20 on Dec 2021  -We discussed this in detail and given your general lack of preference for medications you have declined inhaler treprostinil at this point -We we discussed volunteering in clinical trial as a care option and to help progress with science -given your prior participation in clinical trials appreciate your interest.  We look at you for the PULSE study but at this point you do not qualify because you are starting pulmonary rehabilitation  Plan  -Take informed consent form for the pulse study to review -We can reassess again in few to several months -Lasix per  cardiology    OSA on CPAP  -Continue CPAP per the advice of his sleep doctor  Follow-up -3 months clinical follow-up in a 30-minute slot with Dr. Chase Caller [symptom score and simple walking desaturation test at follow-up    ( Level 05 visit: Estb 40-54 min   in  visit type: on-site physical face to visit  in total care time and counseling or/and coordination of care by this undersigned MD - Dr Brand Males. This includes one or more of the following on this same day 02/23/2021: pre-charting, chart review, note writing, documentation discussion of test results, diagnostic or treatment recommendations, prognosis, risks and benefits of management options, instructions, education, compliance or risk-factor reduction. It excludes time spent by the [place] or office staff in the care of the patient. Actual time 61 min)   SIGNATURE    Dr. Brand Males, M.D., F.C.C.P,  Pulmonary and Critical Care Medicine Staff Physician, [place] Director - Interstitial Lung Disease  Program  [facility] [place], [place], [address]  Pager: [phone], If no answer or between  15:00h - 7:00h: call [phone] Telephone: [phone]  11:53 AM 02/23/2021

## 2021-02-23 NOTE — Progress Notes (Signed)
Daily Session Note  Patient Details  Name: Patty [name] MRN: [id number] Date of Birth: 07/27/1951 Referring Provider:   April Manson Pulmonary Rehab Walk Test from 02/15/2021 in [facility]  Referring Provider Dr. Chase Caller      Encounter Date: 02/23/2021  Check In:  Session Check In - 02/23/21 1433      Check-In   Supervising physician immediately available to respond to emergencies [facility] immediately available    Physician(s) Dr. Venetia Constable    Location [facility]    Staff Present Rosebud Poles, RN, BSN;Lisa Ysidro Evert, RN;Jessica Hassell Done, MS, ACSM-CEP, Exercise Physiologist    Virtual Visit No    Medication changes reported     No    Fall or balance concerns reported    No    Tobacco Cessation No Change    Warm-up and Cool-down Performed on first and last piece of equipment    Resistance Training Performed Yes    VAD Patient? No    PAD/SET Patient? No      Pain Assessment   Currently in Pain? No/denies    Multiple Pain Sites No           Capillary Blood Glucose: No results found for this or any previous visit (from the past 24 hour(s)).    Social History   Tobacco Use  Smoking Status Former Smoker  . Packs/day: 1.00  . Years: 45.00  . Pack years: 45.00  . Types: Cigarettes  . Quit date: 12/26/2008  . Years since quitting: 12.1  Smokeless Tobacco Never Used    Goals Met:  Proper associated with RPD/PD & O2 Sat Exercise tolerated well Strength training completed today  Goals Unmet:  Not Applicable  Comments: Service time is from 1330 to [place]    Dr. Fransico Him is Medical Director for Cardiac Rehab at [facility].

## 2021-02-25 ENCOUNTER — Encounter (HOSPITAL_COMMUNITY)
Admission: RE | Admit: 2021-02-25 | Discharge: 2021-02-25 | Disposition: A | Discharge status: Home / Self Care | Payer: Medicare Other | Source: Ambulatory Visit | Attending: Internal Medicine | Admitting: Internal Medicine

## 2021-02-25 ENCOUNTER — Other Ambulatory Visit: Payer: Self-pay

## 2021-02-25 DIAGNOSIS — J849 Interstitial pulmonary disease, unspecified: Secondary | ICD-10-CM

## 2021-02-25 NOTE — Progress Notes (Signed)
Daily Session Note  Patient Details  Name: Patty [name] MRN: [id number] Date of Birth: 1951/09/02 Referring Provider:   April Manson Pulmonary Rehab Walk Test from 02/15/2021 in [place]  Referring Provider Dr. Chase Caller      Encounter Date: 02/25/2021  Check In:  Session Check In - 02/25/21 1431      Check-In   Supervising physician immediately available to respond to emergencies [facility] immediately available    Physician(s) Dr. Doristine Bosworth    [facility]    Staff Present Rosebud Poles, RN, BSN;Haneen Bernales Ysidro Evert, RN;Jessica Hassell Done, MS, ACSM-CEP, Exercise Physiologist    Virtual Visit No    Medication changes reported     No    Fall or balance concerns reported    No    Tobacco Cessation No Change    Warm-up and Cool-down Performed on first and last piece of equipment    Resistance Training Performed Yes    VAD Patient? No    PAD/SET Patient? No      Pain Assessment   Currently in Pain? No/denies    Multiple Pain Sites No           Capillary Blood Glucose: No results found for this or any previous visit (from the past 24 hour(s)).    Social History   Tobacco Use  Smoking Status Former Smoker  . Packs/day: 1.00  . Years: 45.00  . Pack years: 45.00  . Types: Cigarettes  . Quit date: 12/26/2008  . Years since quitting: 12.1  Smokeless Tobacco Never Used    Goals Met:  Exercise tolerated well No report of cardiac concerns or symptoms Strength training completed today  Goals Unmet:  Not Applicable  Comments: Service time is from 1350 to 1500    Dr. Fransico Him is Medical Director for Cardiac Rehab at [facility].

## 2021-03-02 ENCOUNTER — Other Ambulatory Visit: Payer: Self-pay

## 2021-03-02 ENCOUNTER — Encounter (HOSPITAL_COMMUNITY)
Admission: RE | Admit: 2021-03-02 | Discharge: 2021-03-02 | Disposition: A | Discharge status: Home / Self Care | Payer: Medicare Other | Source: Ambulatory Visit | Attending: Internal Medicine | Admitting: Internal Medicine

## 2021-03-02 VITALS — Wt 256.8 lb

## 2021-03-02 DIAGNOSIS — J849 Interstitial pulmonary disease, unspecified: Secondary | ICD-10-CM | POA: Diagnosis not present

## 2021-03-02 NOTE — Progress Notes (Signed)
Daily Session Note  Patient Details  Name: Patty [name] MRN: [id number] Date of Birth: 05/26/1951 Referring Provider:   Flowsheet Row Pulmonary Rehab Walk Test from 02/15/2021 in [facility]  Referring Provider Dr. Ramaswamy      Encounter Date: 03/02/2021  Check In:  Session Check In - 03/02/21 1439      Check-In   Supervising physician immediately available to respond to emergencies [facility] immediately available    Physician(s) Dr. Pahwani    [facility]    Staff Present Joan Behrens, RN, BSN; , RN;Jessica Martin, MS, ACSM-CEP, Exercise Physiologist    Virtual Visit No    Medication changes reported     No    Fall or balance concerns reported    No    Tobacco Cessation No Change    Warm-up and Cool-down Performed on first and last piece of equipment    Resistance Training Performed Yes    VAD Patient? No    PAD/SET Patient? No      Pain Assessment   Currently in Pain? No/denies    Multiple Pain Sites No           Capillary Blood Glucose: No results found for this or any previous visit (from the past 24 hour(s)).   Exercise Prescription Changes - 03/02/21 1500      Response to Exercise   Blood Pressure (Admit) 120/80    Blood Pressure (Exercise) 134/70    Blood Pressure (Exit) 114/80    Heart Rate (Admit) 86 bpm    Heart Rate (Exercise) 81 bpm    Heart Rate (Exit) 84 bpm    Oxygen Saturation (Admit) 96 %    Oxygen Saturation (Exercise) 96 %    Oxygen Saturation (Exit) 90 %    Rating of Perceived Exertion (Exercise) 9    Perceived Dyspnea (Exercise) 0    Duration Continue with 30 min of aerobic exercise without signs/symptoms of physical distress.    Intensity Other (comment)   40-80% of HRR     Progression   Progression Continue to progress workloads to maintain intensity without signs/symptoms of physical distress.      Resistance Training   Training Prescription Yes     Weight orange bands    Reps 10-15    Time 10 Minutes      Oxygen   Oxygen Continuous    Liters 3-4      NuStep   Level 3    SPM 80    Minutes 30    METs 1.6           Social History   Tobacco Use  Smoking Status Former Smoker  . Packs/day: 1.00  . Years: 45.00  . Pack years: 45.00  . Types: Cigarettes  . Quit date: 12/26/2008  . Years since quitting: 12.1  Smokeless Tobacco Never Used    Goals Met:  Exercise tolerated well No report of cardiac concerns or symptoms Strength training completed today  Goals Unmet:  Not Applicable  Comments: Service time is from 1345 to 1450    Dr. Traci Turner is Medical Director for Cardiac Rehab at [facility]. 

## 2021-03-03 NOTE — Progress Notes (Signed)
Patty [name] 70 y.o. female Nutrition Note  Diagnosis: ILD  Past Medical History:  Diagnosis Date  . Abnormal nuclear stress test    a. 07/2017: cath with no CAD  . Amyloidosis ([place])   . Anxiety   . Arthritis   . Crohn disease ([place])   . Crohn's colitis ([place])   . DVT (deep venous thrombosis) ([place])   . Dyspnea   . Grade I diastolic dysfunction   . Hyperlipidemia   . ILD (interstitial lung disease) ([place])   . OSA on CPAP    CPAP    AND O2  . Pernicious anemia   . Polycythemia vera ([place])   . Pulmonary embolism ([place]) 2010   s/p hip surgery , reports this was never truly confirmed   . Sleep disorder 01/10/2020   Uses klonopin for sleep since 2014; sleep walking. And anxiety     Medications reviewed.   Current Outpatient Medications:  .  amoxicillin (AMOXIL) 500 MG capsule, Take 2,000 mg by mouth See admin instructions. Before dental procedures, Disp: , Rfl:  .  aspirin EC 81 MG tablet, Take 81 mg by mouth daily., Disp: , Rfl:  .  b complex vitamins capsule, Take 1 capsule by mouth once a week., Disp: , Rfl:  .  Calcium Carbonate-Vit D-Min (CALCIUM 1200) 1200-1000 MG-UNIT CHEW, Chew 1 tablet by mouth daily., Disp: , Rfl:  .  CANNABIDIOL PO, Take 1 Dose by mouth 2 (two) times daily as needed (anxiety/sleep.). CBD Oil, Disp: , Rfl:  .  cetirizine (ZYRTEC) 10 MG tablet, Take 10 mg by mouth daily as needed (sinus/allergies.)., Disp: , Rfl:  .  cetirizine-pseudoephedrine (ZYRTEC-D) 5-120 MG tablet, Take 1 tablet by mouth daily as needed (sinus headaches.)., Disp: , Rfl:  .  clonazePAM (KLONOPIN) 0.5 MG tablet, TAKE 0.5-1 TABLETS (0.25-0.5 MG TOTAL) BY MOUTH DAILY AS NEEDED (FOR ANXIETY OR SLEEP.). (Patient taking differently: Take 0.25-0.5 mg by mouth daily as needed for anxiety (for anxiety or sleep.).), Disp: 90 tablet, Rfl: 1 .  Cyanocobalamin (VITAMIN B-12 IJ), Inject 1,000 mcg as directed every 30 (thirty) days., Disp: , Rfl:  .  FLUoxetine (PROZAC) 20 MG capsule, Take 1  capsule (20 mg total) by mouth daily., Disp: 90 capsule, Rfl: 3 .  fluticasone (FLONASE) 50 MCG/ACT nasal spray, Place 1-2 sprays into both nostrils daily as needed for allergies or rhinitis., Disp: , Rfl:  .  furosemide (LASIX) 20 MG tablet, Take 1 tablet (20 mg total) by mouth as needed for fluid., Disp: 90 tablet, Rfl: 3 .  Glycerin-Polysorbate 80 (REFRESH DRY EYE THERAPY OP), Place 1 drop into both eyes daily., Disp: , Rfl:  .  ibuprofen (ADVIL) 200 MG tablet, Take 400-800 mg by mouth every 8 (eight) hours as needed (pain.)., Disp: , Rfl:  .  mesalamine (PENTASA) 250 MG CR capsule, Take 250-500 mg by mouth 4 (four) times daily as needed (crohn's disease flare ups)., Disp: , Rfl:  .  Multiple Vitamins-Minerals (ICAPS AREDS 2 PO), Take 1 tablet by mouth daily., Disp: , Rfl:  .  prednisoLONE acetate (PRED FORTE) 1 % ophthalmic suspension, Place 1 drop into both eyes 2 (two) times daily as needed (Uveitis)., Disp: , Rfl:  .  Probiotic Product (PROBIOTIC PO), Take 1 capsule by mouth 2 (two) times a week. As needed, Disp: , Rfl:  .  traMADol (ULTRAM) 50 MG tablet, Take 1 tablet (50 mg total) by mouth every 6 (six) hours as needed., Disp: 20 tablet, Rfl: 0 No  current facility-administered medications for this encounter.  Facility-Administered Medications Ordered in Other Encounters:  .  6 CHG cloth bath night before surgery, , , Once **AND** 6 CHG cloth bath AM of surgery, , , Once **AND** Chlorhexidine Gluconate Cloth 2 % PADS 6 each, 6 each, Topical, Once **AND** Chlorhexidine Gluconate Cloth 2 % PADS 6 each, 6 each, Topical, Once, Coralie Keens, MD   Ht Readings from Last 1 Encounters:  02/23/21 5' 4" (1.626 m)     Wt Readings from Last 3 Encounters:  03/02/21 256 lb 13.4 oz (116.5 kg)  02/23/21 255 lb 6.4 oz (115.8 kg)  02/15/21 257 lb 8 oz (116.8 kg)     Body mass index is 44.09 kg/m.   Social History   Tobacco Use  Smoking Status Former Smoker  . Packs/day: 1.00  . Years:  45.00  . Pack years: 45.00  . Types: Cigarettes  . Quit date: 12/26/2008  . Years since quitting: 12.1  Smokeless Tobacco Never Used      Nutrition Note  Spoke with pt. Nutrition Plan and Nutrition Survey goals reviewed with pt.   Pt has prediabetes. A1c 6.4. She is aware of risk factor and working on managing A1C.  Pt biggest concern is weight loss. She went to healthy weight and wellness for a while but has decided not to go back.  She lost 30 lbs but has since gained it back. We reviewed her weight hx and dieting hx. She weighed 145 lbs as an adult. A few years ago she gained about 60 lbs in 3-4 months. She was very concerned about this rapid weight gain. She has seen doctors and attended weight loss clinic.  She states BMR leaving clinic was 1100 kcals.  She is now trying to eat a lower calorie diet. She logs on myfitnesspal.  Recent dx of pulmonary htn. She is reading labels and avoiding highly processed foods.   Diet recall: Breakfast: 2 eggs, 2 [place] whole wheat bread Lunch: tuna salad OR asparagus dip and pretzel thins OR forgets Dinner: 7 oz chx/steak/salmon, frozen veggies/canned potatoes/salad Snack: [place]asso barx2, 2 oranges before bed.  We discussed her exercise habits. She is not using O2 while cooking or grocery shopping. She states it is not interfering with ability to procure food and feed self.  She also has Crohns and it is finally under control after 2 years of struggling with it.  Pt expressed understanding of the information reviewed.    Nutrition Diagnosis ? Obese  III = 40+ related to excessive energy intake as evidenced by a Body mass index is 44.09 kg/m.  Nutrition Intervention ? Pt's individual nutrition plan reviewed with pt. ? Benefits of adopting healthy diet reviewed with Rate My Plate survey ? Continue client-centered nutrition education by RD, as part of interdisciplinary care.  Goal(s) ? Pt to identify food quantities necessary to  achieve weight loss of 6-24 lb at graduation from cardiac rehab.  ? Pt to build a healthy plate including vegetables, fruits, whole grains, and low-fat dairy products in a heart healthy meal plan.  Plan:   Will provide client-centered nutrition education as part of interdisciplinary care  Monitor and evaluate progress toward nutrition goal with team.   Michaele Offer, MS, RDN, LDN

## 2021-03-04 ENCOUNTER — Encounter (HOSPITAL_COMMUNITY)
Admission: RE | Admit: 2021-03-04 | Discharge: 2021-03-04 | Disposition: A | Discharge status: Home / Self Care | Payer: Medicare Other | Source: Ambulatory Visit | Attending: Internal Medicine | Admitting: Internal Medicine

## 2021-03-04 ENCOUNTER — Other Ambulatory Visit: Payer: Self-pay

## 2021-03-04 DIAGNOSIS — J849 Interstitial pulmonary disease, unspecified: Secondary | ICD-10-CM

## 2021-03-04 NOTE — Progress Notes (Signed)
Daily Session Note  Patient Details  Name: Patty [name] MRN: [id number] Date of Birth: March 06, 1951 Referring Provider:   April Manson Pulmonary Rehab Walk Test from 02/15/2021 in [place]  Referring Provider Dr. Chase Caller      Encounter Date: 03/04/2021  Check In:  Session Check In - 03/04/21 1403      Check-In   Supervising physician immediately available to respond to emergencies [facility] immediately available    Physician(s) Dr. Arbutus Ped    [facility]    Staff Present Rosebud Poles, RN, BSN;Waleed Dettman Ysidro Evert, RN;David Geraldine, MS, EP-C, CCRP    Virtual Visit No    Medication changes reported     No    Fall or balance concerns reported    No    Tobacco Cessation No Change    Warm-up and Cool-down Performed on first and last piece of equipment    Resistance Training Performed Yes    VAD Patient? No    PAD/SET Patient? No      Pain Assessment   Currently in Pain? No/denies    Multiple Pain Sites No           Capillary Blood Glucose: No results found for this or any previous visit (from the past 24 hour(s)).    Social History   Tobacco Use  Smoking Status Former Smoker  . Packs/day: 1.00  . Years: 45.00  . Pack years: 45.00  . Types: Cigarettes  . Quit date: 12/26/2008  . Years since quitting: 12.1  Smokeless Tobacco Never Used    Goals Met:  Exercise tolerated well No report of cardiac concerns or symptoms Strength training completed today  Goals Unmet:  Not Applicable  Comments: Service time is from 1335 to 1435    Dr. Fransico Him is Medical Director for Cardiac Rehab at [facility].

## 2021-03-07 ENCOUNTER — Other Ambulatory Visit: Payer: Self-pay | Admitting: Family Medicine

## 2021-03-08 NOTE — Telephone Encounter (Signed)
Last refill: 10/07/20 #90, 1 Last OV: 02/01/21 dx. ILD

## 2021-03-09 ENCOUNTER — Encounter (HOSPITAL_COMMUNITY)
Admission: RE | Admit: 2021-03-09 | Discharge: 2021-03-09 | Disposition: A | Discharge status: Home / Self Care | Payer: Medicare Other | Source: Ambulatory Visit | Attending: Internal Medicine | Admitting: Internal Medicine

## 2021-03-09 ENCOUNTER — Other Ambulatory Visit: Payer: Self-pay

## 2021-03-09 DIAGNOSIS — J849 Interstitial pulmonary disease, unspecified: Secondary | ICD-10-CM | POA: Diagnosis not present

## 2021-03-09 NOTE — Progress Notes (Signed)
Daily Session Note  Patient Details  Name: Patty [name] MRN: [id number] Date of Birth: [birth year] Referring Provider:   April Manson Pulmonary Rehab Walk Test from 02/15/2021 in [place]  Referring Provider Dr. Chase Caller      Encounter Date: 03/09/2021  Check In:  Session Check In - 03/09/21 [place]      Check-In   Supervising physician immediately available to respond to emergencies [facility] immediately available    Physician(s) Dr. Arbutus Ped    [facility]    Staff Present Rosebud Poles, RN, BSN;Carlette Wilber Oliphant, RN, Isaac Laud, MS, ACSM-CEP, Exercise Physiologist;Olinty Celesta Aver, MS, ACSM CEP, Exercise Physiologist    Virtual Visit No    Medication changes reported     No    Fall or balance concerns reported    No    Tobacco Cessation No Change    Warm-up and Cool-down Performed on first and last piece of equipment    Resistance Training Performed Yes    VAD Patient? No    PAD/SET Patient? No      Pain Assessment   Currently in Pain? No/denies    Multiple Pain Sites No           Capillary Blood Glucose: No results found for this or any previous visit (from the past 24 hour(s)).    Social History   Tobacco Use  Smoking Status Former Smoker  . Packs/day: 1.00  . Years: 45.00  . Pack years: 45.00  . Types: Cigarettes  . Quit date: 12/26/2008  . Years since quitting: 12.2  Smokeless Tobacco Never Used    Goals Met:  Proper associated with RPD/PD & O2 Sat Exercise tolerated well Strength training completed today  Goals Unmet:  Not Applicable  Comments: Service time is from 1340 to 1450.    Dr. Fransico Him is Medical Director for Cardiac Rehab at [facility].

## 2021-03-11 ENCOUNTER — Encounter (HOSPITAL_COMMUNITY)
Admission: RE | Admit: 2021-03-11 | Discharge: 2021-03-11 | Disposition: A | Discharge status: Home / Self Care | Payer: Medicare Other | Source: Ambulatory Visit | Attending: Internal Medicine | Admitting: Internal Medicine

## 2021-03-11 ENCOUNTER — Other Ambulatory Visit: Payer: Self-pay

## 2021-03-11 DIAGNOSIS — J849 Interstitial pulmonary disease, unspecified: Secondary | ICD-10-CM

## 2021-03-11 NOTE — Progress Notes (Signed)
Daily Session Note  Patient Details  Name: Orie Cuttino MRN: [id number] Date of Birth: [birth year] Referring Provider:   April Manson Pulmonary Rehab Walk Test from 02/15/2021 in [place]  Referring Provider Dr. Chase Caller      Encounter Date: 03/11/2021  Check In:  Session Check In - 03/11/21 1354      Check-In   Supervising physician immediately available to respond to emergencies [facility] immediately available    Physician(s) Dr. Florencia Reasons    Location [facility]    Staff Present Rosebud Poles, RN, BSN;Jessica Hassell Done, MS, ACSM-CEP, Exercise Physiologist;David Makemson, MS, EP-C, CCRP    Virtual Visit No    Medication changes reported     No    Fall or balance concerns reported    No    Tobacco Cessation No Change    Warm-up and Cool-down Performed on first and last piece of equipment    Resistance Training Performed Yes    VAD Patient? No    PAD/SET Patient? No      Pain Assessment   Currently in Pain? No/denies    Multiple Pain Sites No           Capillary Blood Glucose: No results found for this or any previous visit (from the past 24 hour(s)).    Social History   Tobacco Use  Smoking Status Former Smoker  . Packs/day: 1.00  . Years: 45.00  . Pack years: 45.00  . Types: Cigarettes  . Quit date: 12/26/2008  . Years since quitting: 12.2  Smokeless Tobacco Never Used    Goals Met:  Proper associated with RPD/PD & O2 Sat Exercise tolerated well Strength training completed today  Goals Unmet:  Not Applicable  Comments: Service time is from 1325 to 1430   Dr. Fransico Him is Medical Director for Cardiac Rehab at [facility].

## 2021-03-16 ENCOUNTER — Other Ambulatory Visit: Payer: Self-pay

## 2021-03-16 ENCOUNTER — Encounter (HOSPITAL_COMMUNITY)
Admission: RE | Admit: 2021-03-16 | Discharge: 2021-03-16 | Disposition: A | Discharge status: Home / Self Care | Payer: Medicare Other | Source: Ambulatory Visit | Attending: Internal Medicine | Admitting: Internal Medicine

## 2021-03-16 VITALS — Wt 257.1 lb

## 2021-03-16 DIAGNOSIS — J849 Interstitial pulmonary disease, unspecified: Secondary | ICD-10-CM

## 2021-03-16 NOTE — Progress Notes (Signed)
Daily Session Note  Patient Details  Name: Patty [name] MRN: [id number] Date of Birth: 10/11/1951 Referring Provider:   Flowsheet Row Pulmonary Rehab Walk Test from 02/15/2021 in [facility]  Referring Provider Dr. Ramaswamy      Encounter Date: 03/16/2021  Check In:  Session Check In - 03/16/21 1431      Check-In   Supervising physician immediately available to respond to emergencies [facility] immediately available    Physician(s) Dr. Adhikari    Location [facility]    Staff Present Joan Behrens, RN, BSN;Lisa Hughes, RN;Jessica Martin, MS, ACSM-CEP, Exercise Physiologist    Virtual Visit No    Medication changes reported     No    Fall or balance concerns reported    No    Tobacco Cessation No Change    Warm-up and Cool-down Performed on first and last piece of equipment    Resistance Training Performed No    VAD Patient? No    PAD/SET Patient? No      Pain Assessment   Currently in Pain? No/denies    Multiple Pain Sites No           Capillary Blood Glucose: No results found for this or any previous visit (from the past 24 hour(s)).   Exercise Prescription Changes - 03/16/21 1500      Response to Exercise   Blood Pressure (Admit) 124/78    Blood Pressure (Exercise) 110/76    Blood Pressure (Exit) 130/60    Heart Rate (Admit) 88 bpm    Heart Rate (Exercise) 118 bpm    Heart Rate (Exit) 103 bpm    Oxygen Saturation (Admit) 98 %    Oxygen Saturation (Exercise) 96 %    Oxygen Saturation (Exit) 95 %    Rating of Perceived Exertion (Exercise) 13    Perceived Dyspnea (Exercise) 2    Duration Continue with 30 min of aerobic exercise without signs/symptoms of physical distress.    Intensity THRR unchanged      Progression   Progression Continue to progress workloads to maintain intensity without signs/symptoms of physical distress.      Resistance Training   Training Prescription Yes    Weight orange  bands    Reps 10-15    Time 10 Minutes      Oxygen   Oxygen Continuous    Liters 3-4   3L seated, 4L walking     NuStep   Level 4    SPM 80    Minutes 20    METs 19      Track   Laps 9    Minutes 10    METs 2.05           Social History   Tobacco Use  Smoking Status Former Smoker  . Packs/day: 1.00  . Years: 45.00  . Pack years: 45.00  . Types: Cigarettes  . Quit date: 12/26/2008  . Years since quitting: 12.2  Smokeless Tobacco Never Used    Goals Met:  Proper associated with RPD/PD & O2 Sat Exercise tolerated well No report of cardiac concerns or symptoms Strength training completed today  Goals Unmet:  Not Applicable  Comments: Service time is from 1340 to 1509    Dr. Traci Turner is Medical Director for Cardiac Rehab at [facility]. 

## 2021-03-18 ENCOUNTER — Encounter (HOSPITAL_COMMUNITY): Payer: Medicare Other

## 2021-03-18 DIAGNOSIS — H40013 Open angle with borderline findings, low risk, bilateral: Secondary | ICD-10-CM | POA: Diagnosis not present

## 2021-03-18 DIAGNOSIS — H04123 Dry eye syndrome of bilateral lacrimal glands: Secondary | ICD-10-CM | POA: Diagnosis not present

## 2021-03-18 DIAGNOSIS — H353132 Nonexudative age-related macular degeneration, bilateral, intermediate dry stage: Secondary | ICD-10-CM | POA: Diagnosis not present

## 2021-03-18 DIAGNOSIS — R7309 Other abnormal glucose: Secondary | ICD-10-CM | POA: Diagnosis not present

## 2021-03-18 DIAGNOSIS — H524 Presbyopia: Secondary | ICD-10-CM | POA: Diagnosis not present

## 2021-03-18 LAB — HM DIABETES EYE EXAM

## 2021-03-23 ENCOUNTER — Other Ambulatory Visit: Payer: Self-pay

## 2021-03-23 ENCOUNTER — Encounter (HOSPITAL_COMMUNITY)
Admission: RE | Admit: 2021-03-23 | Discharge: 2021-03-23 | Disposition: A | Discharge status: Home / Self Care | Payer: Medicare Other | Source: Ambulatory Visit | Attending: Internal Medicine | Admitting: Internal Medicine

## 2021-03-23 DIAGNOSIS — J849 Interstitial pulmonary disease, unspecified: Secondary | ICD-10-CM | POA: Diagnosis not present

## 2021-03-23 NOTE — Progress Notes (Signed)
Pulmonary Individual Treatment Plan  Patient Details  Name: Timira Bieda MRN: [id number] Date of Birth: 1951/10/09 Referring Provider:   April Manson Pulmonary Rehab Walk Test from 02/15/2021 in [facility]  Referring Provider Dr. Chase Caller      Initial Encounter Date:  Flowsheet Row Pulmonary Rehab Walk Test from 02/15/2021 in [place]  Date 02/15/21      Visit Diagnosis: No diagnosis found.  Patient's Home Medications on Admission:   Current Outpatient Medications:  .  clonazePAM (KLONOPIN) 0.5 MG tablet, TAKE 1/2 TO 1 TABLETS (0.25-0.5 MG TOTAL) BY MOUTH DAILY AS NEEDED (FOR ANXIETY OR SLEEP.)., Disp: 90 tablet, Rfl: 1 .  amoxicillin (AMOXIL) 500 MG capsule, Take 2,000 mg by mouth See admin instructions. Before dental procedures, Disp: , Rfl:  .  aspirin EC 81 MG tablet, Take 81 mg by mouth daily., Disp: , Rfl:  .  b complex vitamins capsule, Take 1 capsule by mouth once a week., Disp: , Rfl:  .  Calcium Carbonate-Vit D-Min (CALCIUM 1200) 1200-1000 MG-UNIT CHEW, Chew 1 tablet by mouth daily., Disp: , Rfl:  .  CANNABIDIOL PO, Take 1 Dose by mouth 2 (two) times daily as needed (anxiety/sleep.). CBD Oil, Disp: , Rfl:  .  cetirizine (ZYRTEC) 10 MG tablet, Take 10 mg by mouth daily as needed (sinus/allergies.)., Disp: , Rfl:  .  cetirizine-pseudoephedrine (ZYRTEC-D) 5-120 MG tablet, Take 1 tablet by mouth daily as needed (sinus headaches.)., Disp: , Rfl:  .  Cyanocobalamin (VITAMIN B-12 IJ), Inject 1,000 mcg as directed every 30 (thirty) days., Disp: , Rfl:  .  FLUoxetine (PROZAC) 20 MG capsule, Take 1 capsule (20 mg total) by mouth daily., Disp: 90 capsule, Rfl: 3 .  fluticasone (FLONASE) 50 MCG/ACT nasal spray, Place 1-2 sprays into both nostrils daily as needed for allergies or rhinitis., Disp: , Rfl:  .  furosemide (LASIX) 20 MG tablet, Take 1 tablet (20 mg total) by mouth as needed for fluid., Disp: 90 tablet,  Rfl: 3 .  Glycerin-Polysorbate 80 (REFRESH DRY EYE THERAPY OP), Place 1 drop into both eyes daily., Disp: , Rfl:  .  ibuprofen (ADVIL) 200 MG tablet, Take 400-800 mg by mouth every 8 (eight) hours as needed (pain.)., Disp: , Rfl:  .  mesalamine (PENTASA) 250 MG CR capsule, Take 250-500 mg by mouth 4 (four) times daily as needed (crohn's disease flare ups)., Disp: , Rfl:  .  Multiple Vitamins-Minerals (ICAPS AREDS 2 PO), Take 1 tablet by mouth daily., Disp: , Rfl:  .  prednisoLONE acetate (PRED FORTE) 1 % ophthalmic suspension, Place 1 drop into both eyes 2 (two) times daily as needed (Uveitis)., Disp: , Rfl:  .  Probiotic Product (PROBIOTIC PO), Take 1 capsule by mouth 2 (two) times a week. As needed, Disp: , Rfl:  .  traMADol (ULTRAM) 50 MG tablet, Take 1 tablet (50 mg total) by mouth every 6 (six) hours as needed., Disp: 20 tablet, Rfl: 0 No current facility-administered medications for this encounter.  Facility-Administered Medications Ordered in Other Encounters:  .  6 CHG cloth bath night before surgery, , , Once **AND** 6 CHG cloth bath AM of surgery, , , Once **AND** Chlorhexidine Gluconate Cloth 2 % PADS 6 each, 6 each, Topical, Once **AND** Chlorhexidine Gluconate Cloth 2 % PADS 6 each, 6 each, Topical, Once, Coralie Keens, MD  Past Medical History: Past Medical History:  Diagnosis Date  . Abnormal nuclear stress test    a.  07/2017: cath with no CAD  . Amyloidosis ([place])   . Anxiety   . Arthritis   . Crohn disease ([place])   . Crohn's colitis ([place])   . DVT (deep venous thrombosis) ([place])   . Dyspnea   . Grade I diastolic dysfunction   . Hyperlipidemia   . ILD (interstitial lung disease) ([place])   . OSA on CPAP    CPAP    AND O2  . Pernicious anemia   . Polycythemia vera ([place])   . Pulmonary embolism ([place]) 2010   s/p hip surgery , reports this was never truly confirmed   . Sleep disorder 01/10/2020   Uses klonopin for sleep since 2014; sleep walking. And anxiety    Tobacco  Use: Social History   Tobacco Use  Smoking Status Former Smoker  . Packs/day: 1.00  . Years: 45.00  . Pack years: 45.00  . Types: Cigarettes  . Quit date: 12/26/2008  . Years since quitting: 12.2  Smokeless Tobacco Never Used    Labs: Recent Chemical engineer    Labs for ITP Cardiac and Pulmonary Rehab Latest Ref Rng & Units 09/09/2020 12/07/2020 12/07/2020 12/07/2020 02/01/2021   Cholestrol 0 - 200 mg/dL - - - - 190   LDLCALC 0 - 99 mg/dL - - - - 120(H)   HDL >39.00 mg/dL - - - - 55.60   Trlycerides 0.0 - 149.0 mg/dL - - - - 75.0   Hemoglobin A1c 4.6 - 6.5 % 6.3(H) - - - 6.4   HCO3 20.0 - 28.0 mmol/L - 27.0 26.9 26.6 -   TCO2 22 - 32 mmol/L - _0 -   O2SAT % - 62.0 62.0 64.0 -      Capillary Blood Glucose: Lab Results  Component Value Date   GLUCAP 123 (H) 01/28/2021     Pulmonary Assessment Scores:  Pulmonary Assessment Scores    Row Name 02/15/21 1140 02/15/21 1150 02/15/21 1523     ADL UCSD   ADL Phase Entry Entry Entry   SOB Score total 39 39 --     CAT Score   CAT Score 9 9 --     mMRC Score   mMRC Score -- -- 3         UCSD: Self-administered rating of dyspnea associated with activities of daily living (ADLs) 6-point scale (0 = "not at all" to 5 = "maximal or unable to do because of breathlessness")  Scoring Scores range from 0 to 120.  Minimally important difference is 5 units  CAT: CAT can identify the health impairment of COPD patients and is better correlated with disease progression.  CAT has a scoring range of zero to 40. The CAT score is classified into four groups of low (less than 10), medium (10 - 20), high (21-30) and very high (31-40) based on the impact level of disease on health status. A CAT score over 10 suggests significant symptoms.  A worsening CAT score could be explained by an exacerbation, poor medication adherence, poor inhaler technique, or progression of COPD or comorbid conditions.  CAT MCID is 2 points  mMRC: mMRC  (Modified Medical Research Council) Dyspnea Scale is used to assess the degree of baseline functional disability in patients of respiratory disease due to dyspnea. No minimal important difference is established. A decrease in score of 1 point or greater is considered a positive change.   Pulmonary Function Assessment:  Pulmonary Function Assessment - 02/15/21 1120      Breath  Bilateral Breath Sounds Clear    Shortness of Breath Yes;Limiting activity           Exercise Target Goals: Exercise Program Goal: Individual exercise prescription set using results from initial 6 min walk test and THRR while considering  patient's activity barriers and safety.   Exercise Prescription Goal: Initial exercise prescription builds to 30-45 minutes a day of aerobic activity, 2-3 days per week.  Home exercise guidelines will be given to patient during program as part of exercise prescription that the participant will acknowledge.  Activity Barriers & Risk Stratification:  Activity Barriers & Cardiac Risk Stratification - 02/15/21 1115      Activity Barriers & Cardiac Risk Stratification   Activity Barriers Right Hip Replacement;Deconditioning;Muscular Weakness;Shortness of Breath           6 Minute Walk:  6 Minute Walk    Row Name 02/15/21 1224         6 Minute Walk   Phase Initial     Distance 715 feet     Walk Time 6 minutes     # of Rest Breaks 0     MPH 1.35     METS 1.81     RPE 14     Perceived Dyspnea  3     VO2 Peak 6.33     Symptoms Yes (comment)     Comments Oxygen saturation dropped below 88% twice and had to increase to 4L with exertion     Resting HR 99 bpm     Resting BP 102/70     Resting Oxygen Saturation  96 %     Exercise Oxygen Saturation  during 6 min walk 86 %     Max Ex. HR 136 bpm     Max Ex. BP 182/74     2 Minute Post BP 140/76           Interval HR   1 Minute HR 101     2 Minute HR 101     3 Minute HR 124     4 Minute HR 128     5 Minute HR 125      6 Minute HR 136     2 Minute Post HR 102     Interval Heart Rate? Yes           Interval Oxygen   Interval Oxygen? Yes     Baseline Oxygen Saturation % 96 %     1 Minute Oxygen Saturation % 91 %     1 Minute Liters of Oxygen 2 L     2 Minute Oxygen Saturation % 86 %     2 Minute Liters of Oxygen 2 L     3 Minute Oxygen Saturation % 92 %     3 Minute Liters of Oxygen 3 L     4 Minute Oxygen Saturation % 87 %     4 Minute Liters of Oxygen 3 L     5 Minute Oxygen Saturation % 91 %     5 Minute Liters of Oxygen 4 L     6 Minute Oxygen Saturation % 89 %     6 Minute Liters of Oxygen 4 L     2 Minute Post Oxygen Saturation % 98 %     2 Minute Post Liters of Oxygen 4 L            Oxygen Initial Assessment:  Oxygen Initial Assessment - 02/15/21 1230  Home Oxygen   Home Oxygen Device Home Concentrator;E-Tanks    Sleep Oxygen Prescription Continuous    Liters per minute 2    Home Exercise Oxygen Prescription Continuous    Liters per minute 3    Home Resting Oxygen Prescription None    Compliance with Home Oxygen Use Yes      Initial 6 min Walk   Oxygen Used Continuous    Liters per minute 4      Program Oxygen Prescription   Program Oxygen Prescription Continuous    Liters per minute 4    Comments SaO2 dropped to 86% on 2L, increased to 3L and oxygen saturation dropped to 87%, increased to 4L. Pt will need to be on 4L for exertion      Intervention   Short Term Goals To learn and exhibit compliance with exercise, home and travel O2 prescription;To learn and understand importance of monitoring SPO2 with pulse oximeter and demonstrate accurate use of the pulse oximeter.;To learn and understand importance of maintaining oxygen saturations>88%;To learn and demonstrate proper pursed lip breathing techniques or other breathing techniques.;To learn and demonstrate proper use of respiratory medications    Long  Term Goals Exhibits compliance with exercise, home and travel  O2 prescription;Verbalizes importance of monitoring SPO2 with pulse oximeter and return demonstration;Maintenance of O2 saturations>88%;Exhibits proper breathing techniques, such as pursed lip breathing or other method taught during program session;Compliance with respiratory medication;Demonstrates proper use of MDI's           Oxygen Re-Evaluation:  Oxygen Re-Evaluation    Row Name 03/23/21 1551             Program Oxygen Prescription   Program Oxygen Prescription Continuous       Liters per minute 4       Comments Pt has been found to need 3L for seated exercise but needs 5L with walking to maintain sats above 88%.               Home Oxygen   Home Oxygen Device Home Concentrator;E-Tanks       Sleep Oxygen Prescription Continuous       Liters per minute 2       Home Exercise Oxygen Prescription Continuous       Liters per minute 3       Home Resting Oxygen Prescription None       Compliance with Home Oxygen Use Yes               Goals/Expected Outcomes   Short Term Goals To learn and exhibit compliance with exercise, home and travel O2 prescription;To learn and understand importance of monitoring SPO2 with pulse oximeter and demonstrate accurate use of the pulse oximeter.;To learn and understand importance of maintaining oxygen saturations>88%;To learn and demonstrate proper pursed lip breathing techniques or other breathing techniques.;To learn and demonstrate proper use of respiratory medications       Long  Term Goals Exhibits compliance with exercise, home and travel O2 prescription;Verbalizes importance of monitoring SPO2 with pulse oximeter and return demonstration;Maintenance of O2 saturations>88%;Exhibits proper breathing techniques, such as pursed lip breathing or other method taught during program session;Compliance with respiratory medication;Demonstrates proper use of MDI's       Comments Pt is compliant with O2 prescription, hope she continues this with changes in  oxygen needs for exertion.       Goals/Expected Outcomes compliance and understanding of maintaining oxygen saturation and performing pursed lip breathing without cueing.  Oxygen Discharge (Final Oxygen Re-Evaluation):  Oxygen Re-Evaluation - 03/23/21 1551      Program Oxygen Prescription   Program Oxygen Prescription Continuous    Liters per minute 4    Comments Pt has been found to need 3L for seated exercise but needs 5L with walking to maintain sats above 88%.      Home Oxygen   Home Oxygen Device Home Concentrator;E-Tanks    Sleep Oxygen Prescription Continuous    Liters per minute 2    Home Exercise Oxygen Prescription Continuous    Liters per minute 3    Home Resting Oxygen Prescription None    Compliance with Home Oxygen Use Yes      Goals/Expected Outcomes   Short Term Goals To learn and exhibit compliance with exercise, home and travel O2 prescription;To learn and understand importance of monitoring SPO2 with pulse oximeter and demonstrate accurate use of the pulse oximeter.;To learn and understand importance of maintaining oxygen saturations>88%;To learn and demonstrate proper pursed lip breathing techniques or other breathing techniques.;To learn and demonstrate proper use of respiratory medications    Long  Term Goals Exhibits compliance with exercise, home and travel O2 prescription;Verbalizes importance of monitoring SPO2 with pulse oximeter and return demonstration;Maintenance of O2 saturations>88%;Exhibits proper breathing techniques, such as pursed lip breathing or other method taught during program session;Compliance with respiratory medication;Demonstrates proper use of MDI's    Comments Pt is compliant with O2 prescription, hope she continues this with changes in oxygen needs for exertion.    Goals/Expected Outcomes compliance and understanding of maintaining oxygen saturation and performing pursed lip breathing without cueing.           Initial  Exercise Prescription:  Initial Exercise Prescription - 02/15/21 1500      Date of Initial Exercise RX and Referring Provider   Date 02/15/21    Referring Provider Dr. Chase Caller    Expected Discharge Date 04/22/21      Oxygen   Oxygen Continuous    Liters 3-4      NuStep   Level 1    SPM 80    Minutes 30      Prescription Details   Frequency (times per week) 2    Duration Progress to 45 minutes of aerobic exercise without signs/symptoms of physical distress      Intensity   THRR 40-80% of Max Heartrate 60-120    Ratings of Perceived Exertion 11-13    Perceived Dyspnea 0-4      Progression   Progression Continue to progress workloads to maintain intensity without signs/symptoms of physical distress.      Resistance Training   Training Prescription Yes    Weight orange bands    Reps 10-15           Perform Capillary Blood Glucose checks as needed.  Exercise Prescription Changes:  Exercise Prescription Changes    Row Name 03/02/21 1500 03/16/21 1500           Response to Exercise   Blood Pressure (Admit) 120/80 124/78      Blood Pressure (Exercise) 134/70 110/76      Blood Pressure (Exit) 114/80 130/60      Heart Rate (Admit) 86 bpm 88 bpm      Heart Rate (Exercise) 81 bpm 118 bpm      Heart Rate (Exit) 84 bpm 103 bpm      Oxygen Saturation (Admit) 96 % 98 %      Oxygen Saturation (Exercise) 96 % 96 %  Oxygen Saturation (Exit) 90 % 95 %      Rating of Perceived Exertion (Exercise) 9 13      Perceived Dyspnea (Exercise) 0 2      Duration Continue with 30 min of aerobic exercise without signs/symptoms of physical distress. Continue with 30 min of aerobic exercise without signs/symptoms of physical distress.      Intensity Other (comment)  40-80% of HRR THRR unchanged             Progression   Progression Continue to progress workloads to maintain intensity without signs/symptoms of physical distress. Continue to progress workloads to maintain intensity  without signs/symptoms of physical distress.             Resistance Training   Training Prescription Yes Yes      Weight orange bands orange bands      Reps 10-15 10-15      Time 10 Minutes 10 Minutes             Oxygen   Oxygen Continuous Continuous      Liters 3-4 3-4  3L seated, 4L walking             NuStep   Level 3 4      SPM 80 80      Minutes 30 20      METs 1.6 19             Track   Laps -- 9      Minutes -- 10      METs -- 2.05             Exercise Comments:  Exercise Comments    Row Name 02/23/21 1625           Exercise Comments Patient completed first day of exercise with no concerns. She was complaining of muscle stiffness in the groin and lower body area throughout the 30 minutes of exercise on the Nustep. She stated that she did not have any pain. She is obese and is limited to certain pieces of equipment. She is starting with 30 minutes on the Nustep and hopefully we will progress to walking on the track for at least 10 minutes. She was also able to do resistance training with some limitations but was able to do all resistance exercises. Will continue to monitor and progress as able.              Exercise Goals and Review:  Exercise Goals    Row Name 02/15/21 1223             Exercise Goals   Increase Physical Activity Yes       Intervention Provide advice, education, support and counseling about physical activity/exercise needs.;Develop an individualized exercise prescription for aerobic and resistive training based on initial evaluation findings, risk stratification, comorbidities and participant's personal goals.       Expected Outcomes Short Term: Attend rehab on a regular basis to increase amount of physical activity.;Long Term: Add in home exercise to make exercise part of routine and to increase amount of physical activity.;Long Term: Exercising regularly at least 3-5 days a week.       Increase Strength and Stamina Yes        Intervention Provide advice, education, support and counseling about physical activity/exercise needs.;Develop an individualized exercise prescription for aerobic and resistive training based on initial evaluation findings, risk stratification, comorbidities and participant's personal goals.       Expected Outcomes Short  Term: Increase workloads from initial exercise prescription for resistance, speed, and METs.;Short Term: Perform resistance training exercises routinely during rehab and add in resistance training at home;Long Term: Improve cardiorespiratory fitness, muscular endurance and strength as measured by increased METs and functional capacity (6MWT)       Able to understand and use rate of perceived exertion (RPE) scale Yes       Intervention Provide education and explanation on how to use RPE scale       Expected Outcomes Short Term: Able to use RPE daily in rehab to express subjective intensity level;Long Term:  Able to use RPE to guide intensity level when exercising independently       Able to understand and use Dyspnea scale Yes       Intervention Provide education and explanation on how to use Dyspnea scale       Expected Outcomes Short Term: Able to use Dyspnea scale daily in rehab to express subjective sense of shortness of breath during exertion;Long Term: Able to use Dyspnea scale to guide intensity level when exercising independently       Knowledge and understanding of Target Heart Rate Range (THRR) Yes       Intervention Provide education and explanation of THRR including how the numbers were predicted and where they are located for reference       Expected Outcomes Short Term: Able to state/look up THRR;Long Term: Able to use THRR to govern intensity when exercising independently;Short Term: Able to use daily as guideline for intensity in rehab       Understanding of Exercise Prescription Yes       Intervention Provide education, explanation, and written materials on patient's  individual exercise prescription       Expected Outcomes Short Term: Able to explain program exercise prescription;Long Term: Able to explain home exercise prescription to exercise independently              Exercise Goals Re-Evaluation :  Exercise Goals Re-Evaluation    [place] Name 02/22/21 1445 03/23/21 1546           Exercise Goal Re-Evaluation   Exercise Goals Review Increase Physical Activity;Increase Strength and Stamina;Able to understand and use rate of perceived exertion (RPE) scale;Able to understand and use Dyspnea scale;Knowledge and understanding of Target Heart Rate Range (THRR);Understanding of Exercise Prescription Increase Physical Activity;Increase Strength and Stamina;Able to understand and use rate of perceived exertion (RPE) scale;Able to understand and use Dyspnea scale;Knowledge and understanding of Target Heart Rate Range (THRR);Understanding of Exercise Prescription      Comments Patient's first day of exercise is this week. It is too early to note any progression. Will continue to monitor and progress as she is able. Patient has completed 8 exercise sessions and has been steady with workload and MET level increases. We have altered her exercise prescription to doing the Nustep for 20 minutes and walking the track with a rolator for 10 minutes. Her first 4 sessions consisted of her doing the Nustep for 30 minutes with no walking, but have since encouraged walking for at least 10 minutes. Pt has tolerated this well so far with no complaints. She is exercising at 2.1 METS on level 4 of the Nustep. Will continue to monitor and progress as she is able.      Expected Outcomes Through exercise at rehab and home the patient will decrease shortness of breath with daily activities and feel confident in carrying out an exercise regimn at home. Through exercise  at rehab and home, the patient will decrease shortness of breath with daily activities and feel confident in carrying out an  exercise regimn at home.             Discharge Exercise Prescription (Final Exercise Prescription Changes):  Exercise Prescription Changes - 03/16/21 1500      Response to Exercise   Blood Pressure (Admit) 124/78    Blood Pressure (Exercise) 110/76    Blood Pressure (Exit) 130/60    Heart Rate (Admit) 88 bpm    Heart Rate (Exercise) 118 bpm    Heart Rate (Exit) 103 bpm    Oxygen Saturation (Admit) 98 %    Oxygen Saturation (Exercise) 96 %    Oxygen Saturation (Exit) 95 %    Rating of Perceived Exertion (Exercise) 13    Perceived Dyspnea (Exercise) 2    Duration Continue with 30 min of aerobic exercise without signs/symptoms of physical distress.    Intensity THRR unchanged      Progression   Progression Continue to progress workloads to maintain intensity without signs/symptoms of physical distress.      Resistance Training   Training Prescription Yes    Weight orange bands    Reps 10-15    Time 10 Minutes      Oxygen   Oxygen Continuous    Liters 3-4   3L seated, 4L walking     NuStep   Level 4    SPM 80    Minutes 20    METs 19      Track   Laps 9    Minutes 10    METs 2.05           Nutrition:  Target Goals: Understanding of nutrition guidelines, daily intake of sodium <1561m, cholesterol <2065m calories 30% from fat and 7% or less from saturated fats, daily to have 5 or more servings of fruits and vegetables.  Biometrics:  Pre Biometrics - 02/15/21 1116      Pre Biometrics   Height _0  (1.626 m)    Weight 116.8 kg    BMI (Calculated) 44.18    Grip Strength 30 kg            Nutrition Therapy Plan and Nutrition Goals:  Nutrition Therapy & Goals - 03/03/21 1035      Nutrition Therapy   Diet Low sodium      Personal Nutrition Goals   Nutrition Goal Pt to identify food quantities necessary to achieve weight loss of 6-24 lb at graduation from cardiac rehab.    Personal Goal #2 Pt to build a healthy plate including vegetables, fruits,  whole grains, and low-fat dairy products in a heart healthy meal plan.      Intervention Plan   Intervention Prescribe, educate and counsel regarding individualized specific dietary modifications aiming towards targeted core components such as weight, hypertension, lipid management, diabetes, heart failure and other comorbidities.    Expected Outcomes Short Term Goal: Understand basic principles of dietary content, such as calories, fat, sodium, cholesterol and nutrients.           Nutrition Assessments:  MEDIFICTS Score Key:  ?70 Need to make dietary changes   40-70 Heart Healthy Diet  ? 40 Therapeutic Level Cholesterol Diet  Flowsheet Row PULMONARY REHAB OTHER RESPIRATORY [facility] on [facility] 76     Picture Your Plate Scores:  <4<24nhealthy dietary pattern with much room for  improvement.  41-50 Dietary pattern unlikely to meet recommendations for good health and room for improvement.  51-60 More healthful dietary pattern, with some room for improvement.   >60 Healthy dietary pattern, although there may be some specific behaviors that could be improved.    Nutrition Goals Re-Evaluation:  Nutrition Goals Re-Evaluation    [place] Name 03/03/21 1035 03/17/21 1522           Goals   Current Weight 256 lb (116.1 kg) 256 lb (116.1 kg)      Nutrition Goal -- Pt to identify food quantities necessary to achieve weight loss of 6-24 lb at graduation from cardiac rehab.      Comment -- Pt maintained her wt while in pulmonary rehab.              Personal Goal #2 Re-Evaluation   Personal Goal #2 -- Pt to build a healthy plate including vegetables, fruits, whole grains, and low-fat dairy products in a heart healthy meal plan.             Nutrition Goals Discharge (Final Nutrition Goals Re-Evaluation):  Nutrition Goals Re-Evaluation - 03/17/21 1522      Goals   Current Weight 256 lb (116.1 kg)     Nutrition Goal Pt to identify food quantities necessary to achieve weight loss of 6-24 lb at graduation from cardiac rehab.    Comment Pt maintained her wt while in pulmonary rehab.       Personal Goal #2 Re-Evaluation   Personal Goal #2 Pt to build a healthy plate including vegetables, fruits, whole grains, and low-fat dairy products in a heart healthy meal plan.           Psychosocial: Target Goals: Acknowledge presence or absence of significant depression and/or stress, maximize coping skills, provide positive support system. Participant is able to verbalize types and ability to use techniques and skills needed for reducing stress and depression.  Initial Review & Psychosocial Screening:  Initial Psych Review & Screening - 02/15/21 1121      Initial Review   Current issues with Current Anxiety/Panic   takes prozac for anxiety     Family Dynamics   Good Support System? Yes   neighbors     Barriers   Psychosocial barriers to participate in program There are no identifiable barriers or psychosocial needs.      Screening Interventions   Interventions Encouraged to exercise    Expected Outcomes Long Term goal: The participant improves quality of Life and PHQ9 Scores as seen by post scores and/or verbalization of changes           Quality of Life Scores:  Scores of 19 and below usually indicate a poorer quality of life in these areas.  A difference of  2-3 points is a clinically meaningful difference.  A difference of 2-3 points in the total score of the Quality of Life Index has been associated with significant improvement in overall quality of life, self-image, physical symptoms, and general health in studies assessing change in quality of life.  PHQ-9: Recent Review Flowsheet Data    Depression screen [facility] 2/9 02/15/2021 12/03/2020 04/29/2020 01/10/2020 04/05/2018   Decreased Interest 0 0 0 0 0   Down, Depressed, Hopeless 0 1 0 0 1   PHQ - 2 Score 0 1 0 0 1   Altered sleeping 1   - 3  - 0   Tired, decreased energy 1 - 0 - 1   Change in appetite 0 -  0 - 1   Feeling bad or failure about yourself  0 - 0 - 1   Trouble concentrating 0 - 3  - 2   Moving slowly or fidgety/restless 0 - 0 - 0   Suicidal thoughts 0 - 0 - 0   PHQ-9 Score - - 6 - 6   Difficult doing work/chores Not difficult at all - Somewhat difficult  - Not difficult at all     Interpretation of Total Score  Total Score Depression Severity:  1-4 = Minimal depression, 5-9 = Mild depression, 10-14 = Moderate depression, 15-19 = Moderately severe depression, 20-27 = Severe depression   Psychosocial Evaluation and Intervention:  Psychosocial Evaluation - 02/15/21 1126      Psychosocial Evaluation & Interventions   Interventions Relaxation education;Stress management education    Continue Psychosocial Services  No Follow up required           Psychosocial Re-Evaluation:  Psychosocial Re-Evaluation    [place] Name 02/22/21 1349 02/22/21 1350 03/22/21 1348         Psychosocial Re-Evaluation   Current issues with Current Anxiety/Panic Current Anxiety/Panic;Current Sleep Concerns Current Anxiety/Panic     Comments -- Will start the program this week, no change in psychosocial since orientation/walk test. Takes prozac for anxiety and her mental status is stable and controlled.     Expected Outcomes -- That Marielouise will be able to sleep better when she exercises on a regular basis, and handle her stress in healthy ways. For Collie Siad to continue to be free os psychosocial concerns while participating in pulmonary rehab.     Interventions -- Encouraged to attend Pulmonary Rehabilitation for the exercise;Relaxation education;Stress management education Encouraged to attend Pulmonary Rehabilitation for the exercise     Continue Psychosocial Services  -- Follow up required by staff No Follow up required            Psychosocial Discharge (Final Psychosocial Re-Evaluation):  Psychosocial Re-Evaluation - 03/22/21 1348       Psychosocial Re-Evaluation   Current issues with Current Anxiety/Panic    Comments Takes prozac for anxiety and her mental status is stable and controlled.    Expected Outcomes For Collie Siad to continue to be free os psychosocial concerns while participating in pulmonary rehab.    Interventions Encouraged to attend Pulmonary Rehabilitation for the exercise    Continue Psychosocial Services  No Follow up required           Education: Education Goals: Education classes will be provided on a weekly basis, covering required topics. Participant will state understanding/return demonstration of topics presented.  Learning Barriers/Preferences:  Learning Barriers/Preferences - 02/15/21 1127      Learning Barriers/Preferences   Learning Barriers None    Learning Preferences Computer/Internet;Written Material           Education Topics: Risk Factor Reduction:  -Group instruction that is supported by a PowerPoint presentation. Instructor discusses the definition of a risk factor, different risk factors for pulmonary disease, and how the heart and lungs work together.     Nutrition for Pulmonary Patient:  -Group instruction provided by PowerPoint slides, verbal discussion, and written materials to support subject matter. The instructor gives an explanation and review of healthy diet recommendations, which includes a discussion on weight management, recommendations for fruit and vegetable consumption, as well as protein, fluid, caffeine, fiber, sodium, sugar, and alcohol. Tips for eating when patients are short of breath are discussed. Flowsheet Row PULMONARY REHAB OTHER RESPIRATORY from 02/08/2018 in [place]  [place]  Date 12/28/17  Educator edna      Pursed Lip Breathing:  -Group instruction that is supported by demonstration and informational handouts. Instructor discusses the benefits of pursed lip and diaphragmatic breathing and detailed demonstration on how to  preform both.     Oxygen Safety:  -Group instruction provided by PowerPoint, verbal discussion, and written material to support subject matter. There is an overview of "What is Oxygen" and "Why do we need it".  Instructor also reviews how to create a safe environment for oxygen use, the importance of using oxygen as prescribed, and the risks of noncompliance. There is a brief discussion on traveling with oxygen and resources the patient may utilize. Flowsheet Row PULMONARY REHAB OTHER RESPIRATORY from 03/04/2021 in [place]  Date 02/25/21  Educator Handout      Oxygen Equipment:  -Group instruction provided by [facility] Staff utilizing handouts, written materials, and equipment demonstrations. Flowsheet Row PULMONARY REHAB OTHER RESPIRATORY from 02/08/2018 in [place]  Date 02/08/18  Educator [place]  Instruction Review Code 2- Demonstrated Understanding      Signs and Symptoms:  -Group instruction provided by written material and verbal discussion to support subject matter. Warning signs and symptoms of infection, stroke, and heart attack are reviewed and when to call the physician/911 reinforced. Tips for preventing the spread of infection discussed.   Advanced Directives:  -Group instruction provided by verbal instruction and written material to support subject matter. Instructor reviews Advanced Directive laws and proper instruction for filling out document.   Pulmonary Video:  -Group video education that reviews the importance of medication and oxygen compliance, exercise, good nutrition, pulmonary hygiene, and pursed lip and diaphragmatic breathing for the pulmonary patient.   Exercise for the Pulmonary Patient:  -Group instruction that is supported by a PowerPoint presentation. Instructor discusses benefits of exercise, core components of exercise, frequency, duration, and intensity of an exercise  routine, importance of utilizing pulse oximetry during exercise, safety while exercising, and options of places to exercise outside of rehab.     Pulmonary Medications:  -Verbally interactive group education provided by instructor with focus on inhaled medications and proper administration.   Anatomy and Physiology of the Respiratory System and Intimacy:  -Group instruction provided by PowerPoint, verbal discussion, and written material to support subject matter. Instructor reviews respiratory cycle and anatomical components of the respiratory system and their functions. Instructor also reviews differences in obstructive and restrictive respiratory diseases with examples of each. Intimacy, Sex, and Sexuality differences are reviewed with a discussion on how relationships can change when diagnosed with pulmonary disease. Common sexual concerns are reviewed. Flowsheet Row PULMONARY REHAB OTHER RESPIRATORY from 03/04/2021 in [place]  Date 03/04/21  Educator Handout      MD DAY -A group question and answer session with a medical doctor that allows participants to ask questions that relate to their pulmonary disease state. Flowsheet Row PULMONARY REHAB OTHER RESPIRATORY from 02/08/2018 in [place]  Date 01/04/18  Educator [place] -Group or individual verbal, written, or video instructions that support the educational goals of the pulmonary rehab program.   Holiday Eating Survival Tips:  -Group instruction provided by PowerPoint slides, verbal discussion, and written materials to support subject matter. The instructor gives patients tips, tricks, and techniques to help them not only survive but enjoy the holidays despite the onslaught  of food that accompanies the holidays.   Knowledge Questionnaire Score:  Knowledge Questionnaire Score - 02/15/21 1137      Knowledge Questionnaire Score   Pre Score  17/18           Core Components/Risk Factors/Patient Goals at Admission:  Personal Goals and Risk Factors at Admission - 02/15/21 1127      Core Components/Risk Factors/Patient Goals on Admission    Weight Management Obesity    Improve shortness of breath with ADL's Yes    Intervention Provide education, individualized exercise plan and daily activity instruction to help decrease symptoms of SOB with activities of daily living.    Expected Outcomes Short Term: Improve cardiorespiratory fitness to achieve a reduction of symptoms when performing ADLs;Long Term: Be able to perform more ADLs without symptoms or delay the onset of symptoms           Core Components/Risk Factors/Patient Goals Review:   Goals and Risk Factor Review    Row Name 02/15/21 1137 02/22/21 1352 03/22/21 1349         Core Components/Risk Factors/Patient Goals Review   Personal Goals Review Weight Management/Obesity;Develop more efficient breathing techniques such as purse lipped breathing and diaphragmatic breathing and practicing self-pacing with activity.;Increase knowledge of respiratory medications and ability to use respiratory devices properly.;Improve shortness of breath with ADL's Develop more efficient breathing techniques such as purse lipped breathing and diaphragmatic breathing and practicing self-pacing with activity.;Increase knowledge of respiratory medications and ability to use respiratory devices properly.;Improve shortness of breath with ADL's Develop more efficient breathing techniques such as purse lipped breathing and diaphragmatic breathing and practicing self-pacing with activity.;Increase knowledge of respiratory medications and ability to use respiratory devices properly.;Improve shortness of breath with ADL's     Review -- Has not started pulmonary rehab yet, will begin exercising 02/23/2021. Collie Siad is progressing well in pulmonary reha, she is exercising @ 1.9 mets on the nustep and walking 9 laps  in 10 minutes on the track.     Expected Outcomes -- See admission goals. See admission goals.            Core Components/Risk Factors/Patient Goals at Discharge (Final Review):   Goals and Risk Factor Review - 03/22/21 1349      Core Components/Risk Factors/Patient Goals Review   Personal Goals Review Develop more efficient breathing techniques such as purse lipped breathing and diaphragmatic breathing and practicing self-pacing with activity.;Increase knowledge of respiratory medications and ability to use respiratory devices properly.;Improve shortness of breath with ADL's    Review Collie Siad is progressing well in pulmonary reha, she is exercising @ 1.9 mets on the nustep and walking 9 laps in 10 minutes on the track.    Expected Outcomes See admission goals.           ITP Comments:   Comments:

## 2021-03-23 NOTE — Progress Notes (Signed)
Daily Session Note  Patient Details  Name: Patty [name] MRN: [id number] Date of Birth: 07-Apr-1951 Referring Provider:   April Manson Pulmonary Rehab Walk Test from 02/15/2021 in [place]  Referring Provider Dr. Chase Caller      Encounter Date: 03/23/2021  Check In:  Session Check In - 03/23/21 1420      Check-In   Supervising physician immediately available to respond to emergencies [facility] immediately available    Physician(s) Dr. Tawanna Solo    Location [facility]    Staff Present Rosebud Poles, RN, BSN;Laquinn Shippy Ysidro Evert, RN;Jessica Hassell Done, MS, ACSM-CEP, Exercise Physiologist    Virtual Visit No    Medication changes reported     No    Fall or balance concerns reported    No    Tobacco Cessation No Change    Warm-up and Cool-down Performed on first and last piece of equipment    Resistance Training Performed Yes    VAD Patient? No    PAD/SET Patient? No      Pain Assessment   Currently in Pain? No/denies    Multiple Pain Sites No           Capillary Blood Glucose: No results found for this or any previous visit (from the past 24 hour(s)).    Social History   Tobacco Use  Smoking Status Former Smoker  . Packs/day: 1.00  . Years: 45.00  . Pack years: 45.00  . Types: Cigarettes  . Quit date: 12/26/2008  . Years since quitting: 12.2  Smokeless Tobacco Never Used    Goals Met:  Exercise tolerated well No report of cardiac concerns or symptoms Strength training completed today  Goals Unmet:  Not Applicable  Comments: Service time is from 1340 to 1445    Dr. Fransico Him is Medical Director for Cardiac Rehab at [facility]

## 2021-03-25 ENCOUNTER — Encounter (HOSPITAL_COMMUNITY)
Admission: RE | Admit: 2021-03-25 | Discharge: 2021-03-25 | Disposition: A | Discharge status: Home / Self Care | Payer: Medicare Other | Source: Ambulatory Visit | Attending: Internal Medicine | Admitting: Internal Medicine

## 2021-03-25 ENCOUNTER — Other Ambulatory Visit: Payer: Self-pay

## 2021-03-25 VITALS — Wt 255.7 lb

## 2021-03-25 DIAGNOSIS — J849 Interstitial pulmonary disease, unspecified: Secondary | ICD-10-CM | POA: Diagnosis not present

## 2021-03-25 NOTE — Progress Notes (Signed)
Daily Session Note  Patient Details  Name: Patty [name] MRN: [id number] Date of Birth: 10-23-1951 Referring Provider:   April Manson Pulmonary Rehab Walk Test from 02/15/2021 in [place]  Referring Provider Dr. Chase Caller      Encounter Date: 03/25/2021  Check In:  Session Check In - 03/25/21 1421      Check-In   Supervising physician immediately available to respond to emergencies [facility] immediately available    Physician(s) Dr. Florencia Reasons    [facility]    Staff Present Rosebud Poles, RN, BSN;Lisa Ysidro Evert, RN;Jessica Hassell Done, MS, ACSM-CEP, Exercise Physiologist    Virtual Visit No    Medication changes reported     No    Fall or balance concerns reported    No    Tobacco Cessation No Change    Warm-up and Cool-down Performed on first and last piece of equipment    Resistance Training Performed Yes    VAD Patient? No    PAD/SET Patient? No      Pain Assessment   Currently in Pain? No/denies    Multiple Pain Sites No           Capillary Blood Glucose: No results found for this or any previous visit (from the past 24 hour(s)).    Social History   Tobacco Use  Smoking Status Former Smoker  . Packs/day: 1.00  . Years: 45.00  . Pack years: 45.00  . Types: Cigarettes  . Quit date: 12/26/2008  . Years since quitting: 12.2  Smokeless Tobacco Never Used    Goals Met:  Proper associated with RPD/PD & O2 Sat Exercise tolerated well Strength training completed today  Goals Unmet:  Not Applicable  Comments: Service time is from 1335 to 1500    Dr. Fransico Him is Medical Director for Cardiac Rehab at [facility]

## 2021-03-25 NOTE — Progress Notes (Signed)
I have reviewed a Home Exercise Prescription with Patty [name] . [name] is currently exercising at home. Patient states she walks for 30 minutes a couple days a week and also does resistance training with bands.The patient was advised to continue walking and doing resistance bands 2-3 days a week for 45-60  minutes.  [name] and I discussed how to progress their exercise prescription. The patient stated that their goals were to lose weight, increase walking distance without having to stop due to SOB, and to overall live a healthy lifestyle.  The patient stated that they understand the exercise prescription.  We reviewed exercise guidelines, target heart rate during exercise, RPE Scale, weather conditions, use of a rescue inhaler, endpoints for exercise, warmup and cool down.  Patient is encouraged to come to me with any questions. I will continue to follow up with the patient to assist them with progression and safety.   Rick Duff MS, ACSM CEP

## 2021-03-30 ENCOUNTER — Encounter (HOSPITAL_COMMUNITY)
Admission: RE | Admit: 2021-03-30 | Discharge: 2021-03-30 | Disposition: A | Discharge status: Home / Self Care | Payer: Medicare Other | Source: Ambulatory Visit | Attending: Internal Medicine | Admitting: Internal Medicine

## 2021-03-30 ENCOUNTER — Other Ambulatory Visit: Payer: Self-pay

## 2021-03-30 DIAGNOSIS — J849 Interstitial pulmonary disease, unspecified: Secondary | ICD-10-CM | POA: Insufficient documentation

## 2021-03-30 NOTE — Progress Notes (Signed)
Daily Session Note  Patient Details  Name: Patty [name] MRN: [id number] Date of Birth: 19-Jan-1951 Referring Provider:   April Manson Pulmonary Rehab Walk Test from 02/15/2021 in [place]  Referring Provider Dr. Chase Caller      Encounter Date: 03/30/2021  Check In:  Session Check In - 03/30/21 1434      Check-In   Supervising physician immediately available to respond to emergencies [facility] immediately available    Physician(s) Dr. Tawanna Solo    [facility]    Staff Present Maurice Small, RN, BSN;Lisa Ysidro Evert, RN;Naliyah Neth Hassell Done, MS, ACSM-CEP, Exercise Physiologist    Virtual Visit No    Medication changes reported     No    Fall or balance concerns reported    No    Tobacco Cessation No Change    Warm-up and Cool-down Performed on first and last piece of equipment    Resistance Training Performed Yes    VAD Patient? No    PAD/SET Patient? No      Pain Assessment   Currently in Pain? No/denies    Multiple Pain Sites No           Capillary Blood Glucose: No results found for this or any previous visit (from the past 24 hour(s)).    Social History   Tobacco Use  Smoking Status Former Smoker  . Packs/day: 1.00  . Years: 45.00  . Pack years: 45.00  . Types: Cigarettes  . Quit date: 12/26/2008  . Years since quitting: 12.2  Smokeless Tobacco Never Used    Goals Met:  Proper associated with RPD/PD & O2 Sat Independence with exercise equipment Exercise tolerated well Strength training completed today  Goals Unmet:  Not Applicable  Comments: Service time is from 1340 to 1450    Dr. Fransico Him is Medical Director for Cardiac Rehab at [facility].

## 2021-03-30 NOTE — Progress Notes (Signed)
Daily Session Note  Patient Details  Name: Patty [name] MRN: [id number] Date of Birth: August 03, 1951 Referring Provider:   April Manson Pulmonary Rehab Walk Test from 02/15/2021 in [place]  Referring Provider Dr. Chase Caller      Encounter Date: 03/25/2021  Check In:   Capillary Blood Glucose: No results found for this or any previous visit (from the past 24 hour(s)).   Exercise Prescription Changes - 03/30/21 1500      Response to Exercise   Blood Pressure (Admit) 112/74    Blood Pressure (Exercise) 134/80    Blood Pressure (Exit) 110/70    Heart Rate (Admit) 93 bpm    Heart Rate (Exercise) 116 bpm    Heart Rate (Exit) 103 bpm    Oxygen Saturation (Admit) 97 %    Oxygen Saturation (Exercise) 95 %    Oxygen Saturation (Exit) 99 %    Rating of Perceived Exertion (Exercise) 13    Perceived Dyspnea (Exercise) 2    Duration Continue with 30 min of aerobic exercise without signs/symptoms of physical distress.    Intensity THRR unchanged      Progression   Progression Continue to progress workloads to maintain intensity without signs/symptoms of physical distress.      Resistance Training   Training Prescription Yes    Weight orange bands    Reps 10-15    Time 10 Minutes      Oxygen   Oxygen Continuous    Liters 3   3L seated and 6L walking     NuStep   Level 4    SPM 80    Minutes 20    METs 2.3      Track   Laps 8    Minutes 10           Social History   Tobacco Use  Smoking Status Former Smoker  . Packs/day: 1.00  . Years: 45.00  . Pack years: 45.00  . Types: Cigarettes  . Quit date: 12/26/2008  . Years since quitting: 12.2  Smokeless Tobacco Never Used    Goals Met:  Proper associated with RPD/PD & O2 Sat Independence with exercise equipment Exercise tolerated well Strength training completed today  Goals Unmet:  Not Applicable  Comments: Service time is from 1340 to 1450    Dr. Fransico Him is  Medical Director for Cardiac Rehab at [facility].

## 2021-04-01 ENCOUNTER — Encounter (HOSPITAL_COMMUNITY)
Admission: RE | Admit: 2021-04-01 | Discharge: 2021-04-01 | Disposition: A | Discharge status: Home / Self Care | Payer: Medicare Other | Source: Ambulatory Visit | Attending: Internal Medicine | Admitting: Internal Medicine

## 2021-04-01 ENCOUNTER — Other Ambulatory Visit: Payer: Self-pay

## 2021-04-01 DIAGNOSIS — J849 Interstitial pulmonary disease, unspecified: Secondary | ICD-10-CM

## 2021-04-01 NOTE — Progress Notes (Signed)
Daily Session Note  Patient Details  Name: Mirela Parsley MRN: [id number] Date of Birth: [birth year] Referring Provider:   April Manson Pulmonary Rehab Walk Test from 02/15/2021 in [place]  Referring Provider Dr. Chase Caller      Encounter Date: 04/01/2021  Check In:  Session Check In - 04/01/21 1434      Check-In   Supervising physician immediately available to respond to emergencies [facility] immediately available    Physician(s) Dr. Pricilla Loveless    [facility]    Staff Present Rosebud Poles, RN, BSN;Ashland Osmer Ysidro Evert, RN;Jessica Hassell Done, MS, ACSM-CEP, Exercise Physiologist    Virtual Visit No    Medication changes reported     No    Fall or balance concerns reported    No    Tobacco Cessation No Change    Warm-up and Cool-down Performed on first and last piece of equipment    Resistance Training Performed Yes    VAD Patient? No    PAD/SET Patient? No      Pain Assessment   Currently in Pain? No/denies    Multiple Pain Sites No           Capillary Blood Glucose: No results found for this or any previous visit (from the past 24 hour(s)).    Social History   Tobacco Use  Smoking Status Former Smoker  . Packs/day: 1.00  . Years: 45.00  . Pack years: 45.00  . Types: Cigarettes  . Quit date: 12/26/2008  . Years since quitting: 12.2  Smokeless Tobacco Never Used    Goals Met:  Exercise tolerated well No report of cardiac concerns or symptoms Strength training completed today  Goals Unmet:  Not Applicable  Comments: Service time is from 1340 to 1450    Dr. Fransico Him is Medical Director for Cardiac Rehab at [facility]

## 2021-04-06 ENCOUNTER — Encounter: Payer: Self-pay | Admitting: Family Medicine

## 2021-04-06 ENCOUNTER — Encounter (HOSPITAL_COMMUNITY)
Admission: RE | Admit: 2021-04-06 | Discharge: 2021-04-06 | Disposition: A | Discharge status: Home / Self Care | Payer: Medicare Other | Source: Ambulatory Visit | Attending: Internal Medicine | Admitting: Internal Medicine

## 2021-04-06 ENCOUNTER — Other Ambulatory Visit: Payer: Self-pay

## 2021-04-06 DIAGNOSIS — J849 Interstitial pulmonary disease, unspecified: Secondary | ICD-10-CM

## 2021-04-06 NOTE — Progress Notes (Signed)
Daily Session Note  Patient Details  Name: Patty [name] MRN: [id number] Date of Birth: 1951-10-24 Referring Provider:   April Manson Pulmonary Rehab Walk Test from 02/15/2021 in [place]  Referring Provider Dr. Chase Caller      Encounter Date: 04/06/2021  Check In:  Session Check In - 04/06/21 1432      Check-In   Supervising physician immediately available to respond to emergencies [facility] immediately available    Physician(s) Dr. Tawanna Solo    Location [facility]    Staff Present Rosebud Poles, RN, Milus Glazier, MS, EP-C, CCRP; Hassell Done, MS, ACSM-CEP, Exercise Physiologist    Virtual Visit No    Medication changes reported     No    Fall or balance concerns reported    No    Tobacco Cessation No Change    Warm-up and Cool-down Performed on first and last piece of equipment    Resistance Training Performed Yes    VAD Patient? No    PAD/SET Patient? No      Pain Assessment   Currently in Pain? No/denies    Multiple Pain Sites No           Capillary Blood Glucose: No results found for this or any previous visit (from the past 24 hour(s)).    Social History   Tobacco Use  Smoking Status Former Smoker  . Packs/day: 1.00  . Years: 45.00  . Pack years: 45.00  . Types: Cigarettes  . Quit date: 12/26/2008  . Years since quitting: 12.2  Smokeless Tobacco Never Used    Goals Met:  Proper associated with RPD/PD & O2 Sat Exercise tolerated well No report of cardiac concerns or symptoms Strength training completed today  Goals Unmet:  Not Applicable  Comments: Service time is from 1325 to 1430    Dr. Fransico Him is Medical Director for Cardiac Rehab at [facility].

## 2021-04-08 ENCOUNTER — Encounter (HOSPITAL_COMMUNITY)
Admission: RE | Admit: 2021-04-08 | Discharge: 2021-04-08 | Disposition: A | Discharge status: Home / Self Care | Payer: Medicare Other | Source: Ambulatory Visit | Attending: Internal Medicine | Admitting: Internal Medicine

## 2021-04-08 ENCOUNTER — Other Ambulatory Visit: Payer: Self-pay

## 2021-04-08 DIAGNOSIS — J849 Interstitial pulmonary disease, unspecified: Secondary | ICD-10-CM | POA: Diagnosis not present

## 2021-04-08 NOTE — Progress Notes (Signed)
Daily Session Note  Patient Details  Name: Patty [name] MRN: [id number] Date of Birth: 05-Aug-1951 Referring Provider:   April Manson Pulmonary Rehab Walk Test from 02/15/2021 in [place]  Referring Provider Dr. Chase Caller      Encounter Date: 04/08/2021  Check In:  Session Check In - 04/08/21 1431      Check-In   Supervising physician immediately available to respond to emergencies [facility] immediately available    Physician(s) Dr. Florencia Reasons    Location [facility]    Staff Present Rosebud Poles, RN, BSN;Lisa Ysidro Evert, RN;Kylen Schliep Hassell Done, MS, ACSM-CEP, Exercise Physiologist    Virtual Visit No    Medication changes reported     No    Fall or balance concerns reported    No    Tobacco Cessation No Change    Warm-up and Cool-down Performed on first and last piece of equipment    Resistance Training Performed Yes    VAD Patient? No    PAD/SET Patient? No      Pain Assessment   Currently in Pain? No/denies    Multiple Pain Sites No           Capillary Blood Glucose: No results found for this or any previous visit (from the past 24 hour(s)).    Social History   Tobacco Use  Smoking Status Former Smoker  . Packs/day: 1.00  . Years: 45.00  . Pack years: 45.00  . Types: Cigarettes  . Quit date: 12/26/2008  . Years since quitting: 12.2  Smokeless Tobacco Never Used    Goals Met:  Proper associated with RPD/PD & O2 Sat Independence with exercise equipment Exercise tolerated well No report of cardiac concerns or symptoms Strength training completed today  Goals Unmet:  Not Applicable  Comments: Service time is from 1335 to 1445    Dr. Fransico Him is Medical Director for Cardiac Rehab at [facility].

## 2021-04-13 ENCOUNTER — Encounter (HOSPITAL_COMMUNITY)
Admission: RE | Admit: 2021-04-13 | Discharge: 2021-04-13 | Disposition: A | Discharge status: Home / Self Care | Payer: Medicare Other | Source: Ambulatory Visit | Attending: Internal Medicine | Admitting: Internal Medicine

## 2021-04-13 ENCOUNTER — Other Ambulatory Visit: Payer: Self-pay

## 2021-04-13 VITALS — Wt 256.4 lb

## 2021-04-13 DIAGNOSIS — J849 Interstitial pulmonary disease, unspecified: Secondary | ICD-10-CM

## 2021-04-13 NOTE — Progress Notes (Signed)
Daily Session Note  Patient Details  Name: Patty [name] MRN: [id number] Date of Birth: 03/08/1951 Referring Provider:   April Manson Pulmonary Rehab Walk Test from 02/15/2021 in [place]  Referring Provider Dr. Chase Caller      Encounter Date: 04/13/2021  Check In:  Session Check In - 04/13/21 1447      Check-In   Supervising physician immediately available to respond to emergencies [facility] immediately available    Physician(s) Dr. Tawanna Solo    Location [facility]    Staff Present Rosebud Poles, RN, BSN;Lisa Ysidro Evert, RN;Jessica Hassell Done, MS, ACSM-CEP, Exercise Physiologist    Virtual Visit No    Medication changes reported     No    Fall or balance concerns reported    No    Tobacco Cessation No Change    Warm-up and Cool-down Performed on first and last piece of equipment    Resistance Training Performed Yes    VAD Patient? No    PAD/SET Patient? No      Pain Assessment   Currently in Pain? No/denies    Multiple Pain Sites No           Capillary Blood Glucose: No results found for this or any previous visit (from the past 24 hour(s)).   Exercise Prescription Changes - 04/13/21 1500      Response to Exercise   Blood Pressure (Admit) 128/80    Blood Pressure (Exercise) 112/70    Blood Pressure (Exit) 114/80    Heart Rate (Admit) 93 bpm    Heart Rate (Exercise) 142 bpm    Heart Rate (Exit) 114 bpm    Oxygen Saturation (Admit) 98 %    Oxygen Saturation (Exercise) 89 %    Oxygen Saturation (Exit) 96 %    Rating of Perceived Exertion (Exercise) 12    Perceived Dyspnea (Exercise) 2    Duration Continue with 30 min of aerobic exercise without signs/symptoms of physical distress.    Intensity THRR unchanged      Progression   Progression Continue to progress workloads to maintain intensity without signs/symptoms of physical distress.      Resistance Training   Training Prescription Yes    Weight orange  bands    Reps 10-15    Time 10 Minutes      Oxygen   Oxygen Continuous    Liters 3-6      NuStep   Level 5    SPM 80    Minutes 20    METs 1.7      Track   Laps 8    Minutes 10           Social History   Tobacco Use  Smoking Status Former Smoker  . Packs/day: 1.00  . Years: 45.00  . Pack years: 45.00  . Types: Cigarettes  . Quit date: 12/26/2008  . Years since quitting: 12.3  Smokeless Tobacco Never Used    Goals Met:  Proper associated with RPD/PD & O2 Sat Exercise tolerated well Strength training completed today  Goals Unmet:  Not Applicable  Comments: Service time is from 1335 to 1444.    Dr. Fransico Him is Medical Director for Cardiac Rehab at [facility].

## 2021-04-15 ENCOUNTER — Encounter (HOSPITAL_COMMUNITY)
Admission: RE | Admit: 2021-04-15 | Discharge: 2021-04-15 | Disposition: A | Discharge status: Home / Self Care | Payer: Medicare Other | Source: Ambulatory Visit | Attending: Internal Medicine | Admitting: Internal Medicine

## 2021-04-15 ENCOUNTER — Other Ambulatory Visit: Payer: Self-pay

## 2021-04-15 ENCOUNTER — Encounter: Payer: Self-pay | Admitting: Family Medicine

## 2021-04-15 DIAGNOSIS — J849 Interstitial pulmonary disease, unspecified: Secondary | ICD-10-CM | POA: Diagnosis not present

## 2021-04-15 NOTE — Progress Notes (Signed)
Daily Session Note  Patient Details  Name: Myanna Ziesmer MRN: [id number] Date of Birth: 1951/05/14 Referring Provider:   April Manson Pulmonary Rehab Walk Test from 02/15/2021 in [place]  Referring Provider Dr. Chase Caller      Encounter Date: 04/15/2021  Check In:  Session Check In - 04/15/21 [place]      Check-In   Supervising physician immediately available to respond to emergencies [facility] immediately available    Physician(s) Dr. Tawanna Solo    Location [facility]    Staff Present Rosebud Poles, RN, BSN;Inis Borneman Ysidro Evert, RN;Jessica Hassell Done, MS, ACSM-CEP, Exercise Physiologist    Virtual Visit No    Medication changes reported     No    Fall or balance concerns reported    No    Tobacco Cessation No Change    Warm-up and Cool-down Performed on first and last piece of equipment    Resistance Training Performed Yes    VAD Patient? No    PAD/SET Patient? No      Pain Assessment   Currently in Pain? No/denies    Multiple Pain Sites No           Capillary Blood Glucose: No results found for this or any previous visit (from the past 24 hour(s)).    Social History   Tobacco Use  Smoking Status Former Smoker  . Packs/day: 1.00  . Years: 45.00  . Pack years: 45.00  . Types: Cigarettes  . Quit date: 12/26/2008  . Years since quitting: 12.3  Smokeless Tobacco Never Used    Goals Met:  Exercise tolerated well No report of cardiac concerns or symptoms Strength training completed today  Goals Unmet:  Not Applicable  Comments: Service time is from 1355 to 1455    Dr. Fransico Him is Medical Director for Cardiac Rehab at [facility].

## 2021-04-19 ENCOUNTER — Other Ambulatory Visit: Payer: Self-pay

## 2021-04-19 MED ORDER — CYANOCOBALAMIN 1000 MCG/ML IJ SOLN: 1000.0000 ug | INTRAMUSCULAR | 0 refills | Status: DC

## 2021-04-19 MED ORDER — "SYRINGE 25G X 5/8"" 3 ML MISC": 1.0000 | Freq: Once | 0 refills | Status: AC

## 2021-04-19 NOTE — Telephone Encounter (Signed)
Yep. thanks

## 2021-04-20 ENCOUNTER — Encounter (HOSPITAL_COMMUNITY)
Admission: RE | Admit: 2021-04-20 | Discharge: 2021-04-20 | Disposition: A | Discharge status: Home / Self Care | Payer: Medicare Other | Source: Ambulatory Visit | Attending: Internal Medicine | Admitting: Internal Medicine

## 2021-04-20 ENCOUNTER — Other Ambulatory Visit: Payer: Self-pay

## 2021-04-20 ENCOUNTER — Telehealth: Payer: Self-pay | Admitting: Internal Medicine

## 2021-04-20 DIAGNOSIS — J849 Interstitial pulmonary disease, unspecified: Secondary | ICD-10-CM | POA: Diagnosis not present

## 2021-04-20 NOTE — Progress Notes (Signed)
Daily Session Note  Patient Details  Name: Patty [name] MRN: [id number] Date of Birth: 1951/01/04 Referring Provider:   April Manson Pulmonary Rehab Walk Test from 02/15/2021 in [place]  Referring Provider Dr. Chase Caller      Encounter Date: 04/20/2021  Check In:  Session Check In - 04/20/21 1406      Check-In   Supervising physician immediately available to respond to emergencies [facility] immediately available    Physician(s) Dr. Tawanna Solo    Location [facility]    Staff Present Rosebud Poles, RN, BSN;Caya Soberanis Ysidro Evert, RN;Jessica Hassell Done, MS, ACSM-CEP, Exercise Physiologist    Virtual Visit No    Medication changes reported     No    Fall or balance concerns reported    No    Tobacco Cessation No Change    Warm-up and Cool-down Performed on first and last piece of equipment    Resistance Training Performed Yes    VAD Patient? No    PAD/SET Patient? No      Pain Assessment   Currently in Pain? No/denies    Multiple Pain Sites No           Capillary Blood Glucose: No results found for this or any previous visit (from the past 24 hour(s)).    Social History   Tobacco Use  Smoking Status Former Smoker  . Packs/day: 1.00  . Years: 45.00  . Pack years: 45.00  . Types: Cigarettes  . Quit date: 12/26/2008  . Years since quitting: 12.3  Smokeless Tobacco Never Used    Goals Met:  Exercise tolerated well No report of cardiac concerns or symptoms Strength training completed today  Goals Unmet:  Not Applicable  Comments: Service time is from 1335 to 1452    Dr. Fransico Him is Medical Director for Cardiac Rehab at [facility].

## 2021-04-20 NOTE — Telephone Encounter (Signed)
MR pulm rehab is requesting a verbal order to increase her target heart rate max.   Please advise. Thanks

## 2021-04-20 NOTE — Progress Notes (Signed)
Pulmonary Individual Treatment Plan  Patient Details  Name: Janiel Derhammer MRN: [id number] Date of Birth: 1951/10/11 Referring Provider:   April Manson Pulmonary Rehab Walk Test from 02/15/2021 in [place]  Referring Provider Dr. Chase Caller      Initial Encounter Date:  Flowsheet Row Pulmonary Rehab Walk Test from 02/15/2021 in [place]  Date 02/15/21      Visit Diagnosis: Interstitial lung disease ([place])  Patient's Home Medications on Admission:   Current Outpatient Medications:  .  clonazePAM (KLONOPIN) 0.5 MG tablet, TAKE 1/2 TO 1 TABLETS (0.25-0.5 MG TOTAL) BY MOUTH DAILY AS NEEDED (FOR ANXIETY OR SLEEP.)., Disp: 90 tablet, Rfl: 1 .  cyanocobalamin (,VITAMIN B-12,) 1000 MCG/ML injection, Inject 1 mL (1,000 mcg total) into the muscle every 30 (thirty) days. Inject 1 mL (1,000 mcg) into the muscle every 30 days, Disp: 12 mL, Rfl: 0 .  amoxicillin (AMOXIL) 500 MG capsule, Take 2,000 mg by mouth See admin instructions. Before dental procedures, Disp: , Rfl:  .  aspirin EC 81 MG tablet, Take 81 mg by mouth daily., Disp: , Rfl:  .  b complex vitamins capsule, Take 1 capsule by mouth once a week., Disp: , Rfl:  .  Calcium Carbonate-Vit D-Min (CALCIUM 1200) 1200-1000 MG-UNIT CHEW, Chew 1 tablet by mouth daily., Disp: , Rfl:  .  CANNABIDIOL PO, Take 1 Dose by mouth 2 (two) times daily as needed (anxiety/sleep.). CBD Oil, Disp: , Rfl:  .  cetirizine (ZYRTEC) 10 MG tablet, Take 10 mg by mouth daily as needed (sinus/allergies.)., Disp: , Rfl:  .  cetirizine-pseudoephedrine (ZYRTEC-D) 5-120 MG tablet, Take 1 tablet by mouth daily as needed (sinus headaches.)., Disp: , Rfl:  .  FLUoxetine (PROZAC) 20 MG capsule, Take 1 capsule (20 mg total) by mouth daily., Disp: 90 capsule, Rfl: 3 .  fluticasone (FLONASE) 50 MCG/ACT nasal spray, Place 1-2 sprays into both nostrils daily as needed for allergies or rhinitis., Disp: , Rfl:  .   furosemide (LASIX) 20 MG tablet, Take 1 tablet (20 mg total) by mouth as needed for fluid., Disp: 90 tablet, Rfl: 3 .  Glycerin-Polysorbate 80 (REFRESH DRY EYE THERAPY OP), Place 1 drop into both eyes daily., Disp: , Rfl:  .  ibuprofen (ADVIL) 200 MG tablet, Take 400-800 mg by mouth every 8 (eight) hours as needed (pain.)., Disp: , Rfl:  .  mesalamine (PENTASA) 250 MG CR capsule, Take 250-500 mg by mouth 4 (four) times daily as needed (crohn's disease flare ups)., Disp: , Rfl:  .  Multiple Vitamins-Minerals (ICAPS AREDS 2 PO), Take 1 tablet by mouth daily., Disp: , Rfl:  .  prednisoLONE acetate (PRED FORTE) 1 % ophthalmic suspension, Place 1 drop into both eyes 2 (two) times daily as needed (Uveitis)., Disp: , Rfl:  .  Probiotic Product (PROBIOTIC PO), Take 1 capsule by mouth 2 (two) times a week. As needed, Disp: , Rfl:  .  traMADol (ULTRAM) 50 MG tablet, Take 1 tablet (50 mg total) by mouth every 6 (six) hours as needed., Disp: 20 tablet, Rfl: 0 No current facility-administered medications for this encounter.  Facility-Administered Medications Ordered in Other Encounters:  .  6 CHG cloth bath night before surgery, , , Once **AND** 6 CHG cloth bath AM of surgery, , , Once **AND** Chlorhexidine Gluconate Cloth 2 % PADS 6 each, 6 each, Topical, Once **AND** Chlorhexidine Gluconate Cloth 2 % PADS 6 each, 6 each, Topical, Once, Coralie Keens, MD  Past Medical History: Past Medical History:  Diagnosis Date  . Abnormal nuclear stress test    a. 07/2017: cath with no CAD  . Amyloidosis ([place])   . Anxiety   . Arthritis   . Crohn disease ([place])   . Crohn's colitis ([place])   . DVT (deep venous thrombosis) ([place])   . Dyspnea   . Grade I diastolic dysfunction   . Hyperlipidemia   . ILD (interstitial lung disease) ([place])   . OSA on CPAP    CPAP    AND O2  . Pernicious anemia   . Polycythemia vera ([place])   . Pulmonary embolism ([place]) 2010   s/p hip surgery , reports this was never truly confirmed   .  Sleep disorder 01/10/2020   Uses klonopin for sleep since 2014; sleep walking. And anxiety    Tobacco Use: Social History   Tobacco Use  Smoking Status Former Smoker  . Packs/day: 1.00  . Years: 45.00  . Pack years: 45.00  . Types: Cigarettes  . Quit date: 12/26/2008  . Years since quitting: 12.3  Smokeless Tobacco Never Used    Labs: Recent Chemical engineer    Labs for ITP Cardiac and Pulmonary Rehab Latest Ref Rng & Units 09/09/2020 12/07/2020 12/07/2020 12/07/2020 02/01/2021   Cholestrol 0 - 200 mg/dL - - - - 190   LDLCALC 0 - 99 mg/dL - - - - 120(H)   HDL >39.00 mg/dL - - - - 55.60   Trlycerides 0.0 - 149.0 mg/dL - - - - 75.0   Hemoglobin A1c 4.6 - 6.5 % 6.3(H) - - - 6.4   HCO3 20.0 - 28.0 mmol/L - 27.0 26.9 26.6 -   TCO2 22 - 32 mmol/L - _0 -   O2SAT % - 62.0 62.0 64.0 -      Capillary Blood Glucose: Lab Results  Component Value Date   GLUCAP 123 (H) 01/28/2021     Pulmonary Assessment Scores:  Pulmonary Assessment Scores    Row Name 02/15/21 1140 02/15/21 1150 02/15/21 1523     ADL UCSD   ADL Phase Entry Entry Entry   SOB Score total 39 39 --     CAT Score   CAT Score 9 9 --     mMRC Score   mMRC Score -- -- 3         UCSD: Self-administered rating of dyspnea associated with activities of daily living (ADLs) 6-point scale (0 = "not at all" to 5 = "maximal or unable to do because of breathlessness")  Scoring Scores range from 0 to 120.  Minimally important difference is 5 units  CAT: CAT can identify the health impairment of COPD patients and is better correlated with disease progression.  CAT has a scoring range of zero to 40. The CAT score is classified into four groups of low (less than 10), medium (10 - 20), high (21-30) and very high (31-40) based on the impact level of disease on health status. A CAT score over 10 suggests significant symptoms.  A worsening CAT score could be explained by an exacerbation, poor medication adherence, poor  inhaler technique, or progression of COPD or comorbid conditions.  CAT MCID is 2 points  mMRC: mMRC (Modified Medical Research Council) Dyspnea Scale is used to assess the degree of baseline functional disability in patients of respiratory disease due to dyspnea. No minimal important difference is established. A decrease in score of 1 point or greater is considered a positive change.  Pulmonary Function Assessment:  Pulmonary Function Assessment - 02/15/21 1120      Breath   Bilateral Breath Sounds Clear    Shortness of Breath Yes;Limiting activity           Exercise Target Goals: Exercise Program Goal: Individual exercise prescription set using results from initial 6 min walk test and THRR while considering  patient's activity barriers and safety.   Exercise Prescription Goal: Initial exercise prescription builds to 30-45 minutes a day of aerobic activity, 2-3 days per week.  Home exercise guidelines will be given to patient during program as part of exercise prescription that the participant will acknowledge.  Activity Barriers & Risk Stratification:  Activity Barriers & Cardiac Risk Stratification - 02/15/21 1115      Activity Barriers & Cardiac Risk Stratification   Activity Barriers Right Hip Replacement;Deconditioning;Muscular Weakness;Shortness of Breath           6 Minute Walk:  6 Minute Walk    Row Name 02/15/21 1224         6 Minute Walk   Phase Initial     Distance 715 feet     Walk Time 6 minutes     # of Rest Breaks 0     MPH 1.35     METS 1.81     RPE 14     Perceived Dyspnea  3     VO2 Peak 6.33     Symptoms Yes (comment)     Comments Oxygen saturation dropped below 88% twice and had to increase to 4L with exertion     Resting HR 99 bpm     Resting BP 102/70     Resting Oxygen Saturation  96 %     Exercise Oxygen Saturation  during 6 min walk 86 %     Max Ex. HR 136 bpm     Max Ex. BP 182/74     2 Minute Post BP 140/76           Interval  HR   1 Minute HR 101     2 Minute HR 101     3 Minute HR 124     4 Minute HR 128     5 Minute HR 125     6 Minute HR 136     2 Minute Post HR 102     Interval Heart Rate? Yes           Interval Oxygen   Interval Oxygen? Yes     Baseline Oxygen Saturation % 96 %     1 Minute Oxygen Saturation % 91 %     1 Minute Liters of Oxygen 2 L     2 Minute Oxygen Saturation % 86 %     2 Minute Liters of Oxygen 2 L     3 Minute Oxygen Saturation % 92 %     3 Minute Liters of Oxygen 3 L     4 Minute Oxygen Saturation % 87 %     4 Minute Liters of Oxygen 3 L     5 Minute Oxygen Saturation % 91 %     5 Minute Liters of Oxygen 4 L     6 Minute Oxygen Saturation % 89 %     6 Minute Liters of Oxygen 4 L     2 Minute Post Oxygen Saturation % 98 %     2 Minute Post Liters of Oxygen 4 L  Oxygen Initial Assessment:  Oxygen Initial Assessment - 02/15/21 1230      Home Oxygen   Home Oxygen Device Home Concentrator;E-Tanks    Sleep Oxygen Prescription Continuous    Liters per minute 2    Home Exercise Oxygen Prescription Continuous    Liters per minute 3    Home Resting Oxygen Prescription None    Compliance with Home Oxygen Use Yes      Initial 6 min Walk   Oxygen Used Continuous    Liters per minute 4      Program Oxygen Prescription   Program Oxygen Prescription Continuous    Liters per minute 4    Comments SaO2 dropped to 86% on 2L, increased to 3L and oxygen saturation dropped to 87%, increased to 4L. Pt will need to be on 4L for exertion      Intervention   Short Term Goals To learn and exhibit compliance with exercise, home and travel O2 prescription;To learn and understand importance of monitoring SPO2 with pulse oximeter and demonstrate accurate use of the pulse oximeter.;To learn and understand importance of maintaining oxygen saturations>88%;To learn and demonstrate proper pursed lip breathing techniques or other breathing techniques.;To learn and demonstrate proper  use of respiratory medications    Long  Term Goals Exhibits compliance with exercise, home and travel O2 prescription;Verbalizes importance of monitoring SPO2 with pulse oximeter and return demonstration;Maintenance of O2 saturations>88%;Exhibits proper breathing techniques, such as pursed lip breathing or other method taught during program session;Compliance with respiratory medication;Demonstrates proper use of MDI's           Oxygen Re-Evaluation:  Oxygen Re-Evaluation    Row Name 03/23/21 1551 04/19/21 0913           Program Oxygen Prescription   Program Oxygen Prescription Continuous Continuous      Liters per minute 4 4      Comments Pt has been found to need 3L for seated exercise but needs 5L with walking to maintain sats above 88%. 3L for seated exercise and 6L with exertion. Our continuous oxygen tanks go from 4 to 6. She can use 5L with her tank at home with exertion but has had to be on 6L at pulmonary rehab.             Home Oxygen   Home Oxygen Device Home Concentrator;E-Tanks Home Concentrator;E-Tanks      Sleep Oxygen Prescription Continuous Continuous      Liters per minute 2 2      Home Exercise Oxygen Prescription Continuous Continuous      Liters per minute 3 3      Home Resting Oxygen Prescription None None      Compliance with Home Oxygen Use Yes Yes             Goals/Expected Outcomes   Short Term Goals To learn and exhibit compliance with exercise, home and travel O2 prescription;To learn and understand importance of monitoring SPO2 with pulse oximeter and demonstrate accurate use of the pulse oximeter.;To learn and understand importance of maintaining oxygen saturations>88%;To learn and demonstrate proper pursed lip breathing techniques or other breathing techniques.;To learn and demonstrate proper use of respiratory medications To learn and exhibit compliance with exercise, home and travel O2 prescription;To learn and understand importance of monitoring SPO2  with pulse oximeter and demonstrate accurate use of the pulse oximeter.;To learn and understand importance of maintaining oxygen saturations>88%;To learn and demonstrate proper pursed lip breathing techniques or other breathing techniques.;To learn and demonstrate  proper use of respiratory medications      Long  Term Goals Exhibits compliance with exercise, home and travel O2 prescription;Verbalizes importance of monitoring SPO2 with pulse oximeter and return demonstration;Maintenance of O2 saturations>88%;Exhibits proper breathing techniques, such as pursed lip breathing or other method taught during program session;Compliance with respiratory medication;Demonstrates proper use of MDI's Exhibits compliance with exercise, home and travel O2 prescription;Verbalizes importance of monitoring SPO2 with pulse oximeter and return demonstration;Maintenance of O2 saturations>88%;Exhibits proper breathing techniques, such as pursed lip breathing or other method taught during program session;Compliance with respiratory medication;Demonstrates proper use of MDI's      Comments Pt is compliant with O2 prescription, hope she continues this with changes in oxygen needs for exertion. Pt is compliant with O2 prescription, hope she continues this with changes in oxygen needs for exertion.      Goals/Expected Outcomes compliance and understanding of maintaining oxygen saturation and performing pursed lip breathing without cueing. For pt to continue monitoring oxygen saturation at home during rest and exertion after completion of pulmonary rehab. Also, to remain complaint with oxygen needs for exertion and to perform pursed lip breathing when needed.             Oxygen Discharge (Final Oxygen Re-Evaluation):  Oxygen Re-Evaluation - 04/19/21 0913      Program Oxygen Prescription   Program Oxygen Prescription Continuous    Liters per minute 4    Comments 3L for seated exercise and 6L with exertion. Our continuous  oxygen tanks go from 4 to 6. She can use 5L with her tank at home with exertion but has had to be on 6L at pulmonary rehab.      Home Oxygen   Home Oxygen Device Home Concentrator;E-Tanks    Sleep Oxygen Prescription Continuous    Liters per minute 2    Home Exercise Oxygen Prescription Continuous    Liters per minute 3    Home Resting Oxygen Prescription None    Compliance with Home Oxygen Use Yes      Goals/Expected Outcomes   Short Term Goals To learn and exhibit compliance with exercise, home and travel O2 prescription;To learn and understand importance of monitoring SPO2 with pulse oximeter and demonstrate accurate use of the pulse oximeter.;To learn and understand importance of maintaining oxygen saturations>88%;To learn and demonstrate proper pursed lip breathing techniques or other breathing techniques.;To learn and demonstrate proper use of respiratory medications    Long  Term Goals Exhibits compliance with exercise, home and travel O2 prescription;Verbalizes importance of monitoring SPO2 with pulse oximeter and return demonstration;Maintenance of O2 saturations>88%;Exhibits proper breathing techniques, such as pursed lip breathing or other method taught during program session;Compliance with respiratory medication;Demonstrates proper use of MDI's    Comments Pt is compliant with O2 prescription, hope she continues this with changes in oxygen needs for exertion.    Goals/Expected Outcomes For pt to continue monitoring oxygen saturation at home during rest and exertion after completion of pulmonary rehab. Also, to remain complaint with oxygen needs for exertion and to perform pursed lip breathing when needed.           Initial Exercise Prescription:  Initial Exercise Prescription - 02/15/21 1500      Date of Initial Exercise RX and Referring Provider   Date 02/15/21    Referring Provider Dr. Chase Caller    Expected Discharge Date 04/22/21      Oxygen   Oxygen Continuous     Liters 3-4      NuStep  Level 1    SPM 80    Minutes 30      Prescription Details   Frequency (times per week) 2    Duration Progress to 45 minutes of aerobic exercise without signs/symptoms of physical distress      Intensity   THRR 40-80% of Max Heartrate 60-120    Ratings of Perceived Exertion 11-13    Perceived Dyspnea 0-4      Progression   Progression Continue to progress workloads to maintain intensity without signs/symptoms of physical distress.      Resistance Training   Training Prescription Yes    Weight orange bands    Reps 10-15           Perform Capillary Blood Glucose checks as needed.  Exercise Prescription Changes:  Exercise Prescription Changes    Row Name 03/02/21 1500 03/16/21 1500 03/25/21 1500 03/30/21 1500 04/13/21 1500     Response to Exercise   Blood Pressure (Admit) 120/80 124/78 -- 112/74 128/80   Blood Pressure (Exercise) 134/70 110/76 -- 134/80 112/70   Blood Pressure (Exit) 114/80 130/60 -- 110/70 114/80   Heart Rate (Admit) 86 bpm 88 bpm -- 93 bpm 93 bpm   Heart Rate (Exercise) 81 bpm 118 bpm -- 116 bpm 142 bpm   Heart Rate (Exit) 84 bpm 103 bpm -- 103 bpm 114 bpm   Oxygen Saturation (Admit) 96 % 98 % -- 97 % 98 %   Oxygen Saturation (Exercise) 96 % 96 % -- 95 % 89 %   Oxygen Saturation (Exit) 90 % 95 % -- 99 % 96 %   Rating of Perceived Exertion (Exercise) 9 13 -- 13 12   Perceived Dyspnea (Exercise) 0 2 -- 2 2   Duration Continue with 30 min of aerobic exercise without signs/symptoms of physical distress. Continue with 30 min of aerobic exercise without signs/symptoms of physical distress. -- Continue with 30 min of aerobic exercise without signs/symptoms of physical distress. Continue with 30 min of aerobic exercise without signs/symptoms of physical distress.   Intensity Other (comment)  40-80% of HRR THRR unchanged -- THRR unchanged THRR unchanged     Progression   Progression Continue to progress workloads to maintain intensity  without signs/symptoms of physical distress. Continue to progress workloads to maintain intensity without signs/symptoms of physical distress. -- Continue to progress workloads to maintain intensity without signs/symptoms of physical distress. Continue to progress workloads to maintain intensity without signs/symptoms of physical distress.     Resistance Training   Training Prescription Yes Yes -- Yes Yes   Weight orange bands orange bands -- orange bands orange bands   Reps 10-15 10-15 -- 10-15 10-15   Time 10 Minutes 10 Minutes -- 10 Minutes 10 Minutes     Oxygen   Oxygen Continuous Continuous -- Continuous Continuous   Liters 3-4 3-4  3L seated, 4L walking -- 3  3L seated and 6L walking 3-6     NuStep   Level 3 4 -- 4 5   SPM 80 80 -- 80 80   Minutes 30 20 -- 20 20   METs 1.6 19 -- 2.3 1.7     Track   Laps -- 9 -- 8 8   Minutes -- 10 -- 10 10   METs -- 2.05 -- -- --     Home Exercise Plan   Plans to continue exercise at -- -- Home (comment)  Walking and resistance training -- --   Frequency -- -- Add 2  additional days to program exercise sessions. -- --   Initial Home Exercises Provided -- -- 03/25/21 -- --          Exercise Comments:  Exercise Comments    Row Name 02/23/21 1625 03/25/21 1556         Exercise Comments Patient completed first day of exercise with no concerns. She was complaining of muscle stiffness in the groin and lower body area throughout the 30 minutes of exercise on the Nustep. She stated that she did not have any pain. She is obese and is limited to certain pieces of equipment. She is starting with 30 minutes on the Nustep and hopefully we will progress to walking on the track for at least 10 minutes. She was also able to do resistance training with some limitations but was able to do all resistance exercises. Will continue to monitor and progress as able. Completed home exercise with patient. Pt is exercising at home occassionally. She states she walks  for 30 minutes and also does the resistance band exercises. Gave pt ways to progress home exercise prescription. Pt was receptive. Will continue to follow up on home exercise prescription.             Exercise Goals and Review:  Exercise Goals    Row Name 02/15/21 1223             Exercise Goals   Increase Physical Activity Yes       Intervention Provide advice, education, support and counseling about physical activity/exercise needs.;Develop an individualized exercise prescription for aerobic and resistive training based on initial evaluation findings, risk stratification, comorbidities and participant's personal goals.       Expected Outcomes Short Term: Attend rehab on a regular basis to increase amount of physical activity.;Long Term: Add in home exercise to make exercise part of routine and to increase amount of physical activity.;Long Term: Exercising regularly at least 3-5 days a week.       Increase Strength and Stamina Yes       Intervention Provide advice, education, support and counseling about physical activity/exercise needs.;Develop an individualized exercise prescription for aerobic and resistive training based on initial evaluation findings, risk stratification, comorbidities and participant's personal goals.       Expected Outcomes Short Term: Increase workloads from initial exercise prescription for resistance, speed, and METs.;Short Term: Perform resistance training exercises routinely during rehab and add in resistance training at home;Long Term: Improve cardiorespiratory fitness, muscular endurance and strength as measured by increased METs and functional capacity (6MWT)       Able to understand and use rate of perceived exertion (RPE) scale Yes       Intervention Provide education and explanation on how to use RPE scale       Expected Outcomes Short Term: Able to use RPE daily in rehab to express subjective intensity level;Long Term:  Able to use RPE to guide intensity  level when exercising independently       Able to understand and use Dyspnea scale Yes       Intervention Provide education and explanation on how to use Dyspnea scale       Expected Outcomes Short Term: Able to use Dyspnea scale daily in rehab to express subjective sense of shortness of breath during exertion;Long Term: Able to use Dyspnea scale to guide intensity level when exercising independently       Knowledge and understanding of Target Heart Rate Range (THRR) Yes  Intervention Provide education and explanation of THRR including how the numbers were predicted and where they are located for reference       Expected Outcomes Short Term: Able to state/look up THRR;Long Term: Able to use THRR to govern intensity when exercising independently;Short Term: Able to use daily as guideline for intensity in rehab       Understanding of Exercise Prescription Yes       Intervention Provide education, explanation, and written materials on patient's individual exercise prescription       Expected Outcomes Short Term: Able to explain program exercise prescription;Long Term: Able to explain home exercise prescription to exercise independently              Exercise Goals Re-Evaluation :  Exercise Goals Re-Evaluation    Row Name 02/22/21 1445 03/23/21 1546 04/19/21 0909         Exercise Goal Re-Evaluation   Exercise Goals Review Increase Physical Activity;Increase Strength and Stamina;Able to understand and use rate of perceived exertion (RPE) scale;Able to understand and use Dyspnea scale;Knowledge and understanding of Target Heart Rate Range (THRR);Understanding of Exercise Prescription Increase Physical Activity;Increase Strength and Stamina;Able to understand and use rate of perceived exertion (RPE) scale;Able to understand and use Dyspnea scale;Knowledge and understanding of Target Heart Rate Range (THRR);Understanding of Exercise Prescription Increase Physical Activity;Increase Strength and  Stamina;Able to understand and use rate of perceived exertion (RPE) scale;Able to understand and use Dyspnea scale;Knowledge and understanding of Target Heart Rate Range (THRR);Understanding of Exercise Prescription     Comments Patient's first day of exercise is this week. It is too early to note any progression. Will continue to monitor and progress as she is able. Patient has completed 8 exercise sessions and has been steady with workload and MET level increases. We have altered her exercise prescription to doing the Nustep for 20 minutes and walking the track with a rolator for 10 minutes. Her first 4 sessions consisted of her doing the Nustep for 30 minutes with no walking, but have since encouraged walking for at least 10 minutes. Pt has tolerated this well so far with no complaints. She is exercising at 2.1 METS on level 4 of the Nustep. Will continue to monitor and progress as she is able. Ivry has completed 15 exercise sessions and has been consistent with workload and MET increases on the Nustep. Pt had been doing 30 minutes on the Nustep but then mentioned wanting to work on walking. We started her out walking 10 minutes on the track and she has tolerated that pretty well. She now does 20 minutes on the Nustep and 10 minutes walking the track. She is exercising at 2.2 METS on the Nustep and she is walking 8 laps in 10 minutes on the track. Will continue to monitor and progress as she is able. She is scheduled graduate from the program this week and we will give her a graduation exercise prescription.     Expected Outcomes Through exercise at rehab and home the patient will decrease shortness of breath with daily activities and feel confident in carrying out an exercise regimn at home. Through exercise at rehab and home, the patient will decrease shortness of breath with daily activities and feel confident in carrying out an exercise regimn at home. For pt to continue exercise after completion of  pulmonary rehab. To also continue monitoring oxygen saturation at rest and with exertion at home.            Discharge Exercise  Prescription (Final Exercise Prescription Changes):  Exercise Prescription Changes - 04/13/21 1500      Response to Exercise   Blood Pressure (Admit) 128/80    Blood Pressure (Exercise) 112/70    Blood Pressure (Exit) 114/80    Heart Rate (Admit) 93 bpm    Heart Rate (Exercise) 142 bpm    Heart Rate (Exit) 114 bpm    Oxygen Saturation (Admit) 98 %    Oxygen Saturation (Exercise) 89 %    Oxygen Saturation (Exit) 96 %    Rating of Perceived Exertion (Exercise) 12    Perceived Dyspnea (Exercise) 2    Duration Continue with 30 min of aerobic exercise without signs/symptoms of physical distress.    Intensity THRR unchanged      Progression   Progression Continue to progress workloads to maintain intensity without signs/symptoms of physical distress.      Resistance Training   Training Prescription Yes    Weight orange bands    Reps 10-15    Time 10 Minutes      Oxygen   Oxygen Continuous    Liters 3-6      NuStep   Level 5    SPM 80    Minutes 20    METs 1.7      Track   Laps 8    Minutes 10           Nutrition:  Target Goals: Understanding of nutrition guidelines, daily intake of sodium <1564m, cholesterol <2042m calories 30% from fat and 7% or less from saturated fats, daily to have 5 or more servings of fruits and vegetables.  Biometrics:  Pre Biometrics - 02/15/21 1116      Pre Biometrics   Height _0  (1.626 m)    Weight 116.8 kg    BMI (Calculated) 44.18    Grip Strength 30 kg            Nutrition Therapy Plan and Nutrition Goals:  Nutrition Therapy & Goals - 03/03/21 1035      Nutrition Therapy   Diet Low sodium      Personal Nutrition Goals   Nutrition Goal Pt to identify food quantities necessary to achieve weight loss of 6-24 lb at graduation from cardiac rehab.    Personal Goal #2 Pt to build a healthy  plate including vegetables, fruits, whole grains, and low-fat dairy products in a heart healthy meal plan.      Intervention Plan   Intervention Prescribe, educate and counsel regarding individualized specific dietary modifications aiming towards targeted core components such as weight, hypertension, lipid management, diabetes, heart failure and other comorbidities.    Expected Outcomes Short Term Goal: Understand basic principles of dietary content, such as calories, fat, sodium, cholesterol and nutrients.           Nutrition Assessments:  MEDIFICTS Score Key:  ?70 Need to make dietary changes   40-70 Heart Healthy Diet  ? 40 Therapeutic Level Cholesterol Diet  Flowsheet Row PULMONARY REHAB OTHER RESPIRAT[facility] [place] on [facility] 76     Picture Your Plate Scores:  <4<26nhealthy dietary pattern with much room for improvement.  41-50 Dietary pattern unlikely to meet recommendations for good health and room for improvement.  51-60 More healthful dietary pattern, with some room for improvement.   >60 Healthy dietary pattern, although there may be some specific behaviors that could be improved.    Nutrition Goals Re-Evaluation:  Nutrition Goals Re-Evaluation    [place] Name 03/03/21 1035 03/17/21 1522 04/16/21 0944         Goals   Current Weight 256 lb (116.1 kg) 256 lb (116.1 kg) 257 lb 4.4 oz (116.7 kg)     Nutrition Goal -- Pt to identify food quantities necessary to achieve weight loss of 6-24 lb at graduation from cardiac rehab. Pt to identify food quantities necessary to achieve weight loss of 6-24 lb at graduation from cardiac rehab.     Comment -- Pt maintained her wt while in pulmonary rehab.  Pt maintained her wt while in pulmonary rehab. Logging <1600 kcals per day on myfitnesspal. Adequate protein intake.           Personal Goal #2 Re-Evaluation   Personal Goal #2 -- Pt to build a  healthy plate including vegetables, fruits, whole grains, and low-fat dairy products in a heart healthy meal plan. Pt to build a healthy plate including vegetables, fruits, whole grains, and low-fat dairy products in a heart healthy meal plan.            Nutrition Goals Discharge (Final Nutrition Goals Re-Evaluation):  Nutrition Goals Re-Evaluation - 04/16/21 0944      Goals   Current Weight 257 lb 4.4 oz (116.7 kg)    Nutrition Goal Pt to identify food quantities necessary to achieve weight loss of 6-24 lb at graduation from cardiac rehab.    Comment Pt maintained her wt while in pulmonary rehab. Logging <1600 kcals per day on myfitnesspal. Adequate protein intake.      Personal Goal #2 Re-Evaluation   Personal Goal #2 Pt to build a healthy plate including vegetables, fruits, whole grains, and low-fat dairy products in a heart healthy meal plan.           Psychosocial: Target Goals: Acknowledge presence or absence of significant depression and/or stress, maximize coping skills, provide positive support system. Participant is able to verbalize types and ability to use techniques and skills needed for reducing stress and depression.  Initial Review & Psychosocial Screening:  Initial Psych Review & Screening - 02/15/21 1121      Initial Review   Current issues with Current Anxiety/Panic   takes prozac for anxiety     Family Dynamics   Good Support System? Yes   neighbors     Barriers   Psychosocial barriers to participate in program There are no identifiable barriers or psychosocial needs.      Screening Interventions   Interventions Encouraged to exercise    Expected Outcomes Long Term goal: The participant improves quality of Life and PHQ9 Scores as seen by post scores and/or verbalization of changes           Quality of Life Scores:  Scores of 19 and below usually indicate a poorer quality of life in these areas.  A difference of  2-3 points is a clinically meaningful  difference.  A difference of 2-3 points in the total score of the Quality of Life Index has been associated with significant improvement in overall quality of life, self-image, physical symptoms, and general health in studies assessing change in quality of life.  PHQ-9: Recent Review Flowsheet Data    Depression screen [facility] 2/9 02/15/2021 12/03/2020 04/29/2020 01/10/2020 04/05/2018   Decreased Interest 0 0 0 0 0   Down, Depressed, Hopeless 0 1 0 0 1   PHQ - 2 Score 0 1 0 0 1   Altered sleeping 1  - 3  -  0   Tired, decreased energy 1 - 0 - 1   Change in appetite 0 - 0 - 1   Feeling bad or failure about yourself  0 - 0 - 1   Trouble concentrating 0 - 3  - 2   Moving slowly or fidgety/restless 0 - 0 - 0   Suicidal thoughts 0 - 0 - 0   PHQ-9 Score - - 6 - 6   Difficult doing work/chores Not difficult at all - Somewhat difficult  - Not difficult at all     Interpretation of Total Score  Total Score Depression Severity:  1-4 = Minimal depression, 5-9 = Mild depression, 10-14 = Moderate depression, 15-19 = Moderately severe depression, 20-27 = Severe depression   Psychosocial Evaluation and Intervention:  Psychosocial Evaluation - 02/15/21 1126      Psychosocial Evaluation & Interventions   Interventions Relaxation education;Stress management education    Continue Psychosocial Services  No Follow up required           Psychosocial Re-Evaluation:  Psychosocial Re-Evaluation    Row Name 02/22/21 1349 02/22/21 1350 03/22/21 1348 04/15/21 0843       Psychosocial Re-Evaluation   Current issues with Current Anxiety/Panic Current Anxiety/Panic;Current Sleep Concerns Current Anxiety/Panic Current Anxiety/Panic    Comments -- Will start the program this week, no change in psychosocial since orientation/walk test. Takes prozac for anxiety and her mental status is stable and controlled. No psychosocial concerns identified at this time.    Expected Outcomes -- That Larinda will be able to sleep better  when she exercises on a regular basis, and handle her stress in healthy ways. For Collie Siad to continue to be free os psychosocial concerns while participating in pulmonary rehab. For Collie Siad to continue to have no concerns while participating in pulmonary rehab.    Interventions -- Encouraged to attend Pulmonary Rehabilitation for the exercise;Relaxation education;Stress management education Encouraged to attend Pulmonary Rehabilitation for the exercise Stress management education;Relaxation education    Continue Psychosocial Services  -- Follow up required by staff No Follow up required No Follow up required           Psychosocial Discharge (Final Psychosocial Re-Evaluation):  Psychosocial Re-Evaluation - 04/15/21 0843      Psychosocial Re-Evaluation   Current issues with Current Anxiety/Panic    Comments No psychosocial concerns identified at this time.    Expected Outcomes For Collie Siad to continue to have no concerns while participating in pulmonary rehab.    Interventions Stress management education;Relaxation education    Continue Psychosocial Services  No Follow up required           Education: Education Goals: Education classes will be provided on a weekly basis, covering required topics. Participant will state understanding/return demonstration of topics presented.  Learning Barriers/Preferences:  Learning Barriers/Preferences - 02/15/21 1127      Learning Barriers/Preferences   Learning Barriers None    Learning Preferences Computer/Internet;Written Material           Education Topics: Risk Factor Reduction:  -Group instruction that is supported by a PowerPoint presentation. Instructor discusses the definition of a risk factor, different risk factors for pulmonary disease, and how the heart and lungs work together.     Nutrition for Pulmonary Patient:  -Group instruction provided by PowerPoint slides, verbal discussion, and written materials to support subject matter. The  instructor gives an explanation and review of healthy diet recommendations, which includes a discussion on weight management, recommendations for fruit and vegetable consumption,  as well as protein, fluid, caffeine, fiber, sodium, sugar, and alcohol. Tips for eating when patients are short of breath are discussed. Flowsheet Row PULMONARY REHAB OTHER RESPIRATORY from 02/08/2018 in [place]  Date 12/28/17  Educator edna      Pursed Lip Breathing:  -Group instruction that is supported by demonstration and informational handouts. Instructor discusses the benefits of pursed lip and diaphragmatic breathing and detailed demonstration on how to preform both.   Flowsheet Row PULMONARY REHAB OTHER RESPIRATORY from 04/15/2021 in [place]  Date 04/15/21  Educator Handout  Jeannie Fend my plate]      Oxygen Safety:  -Group instruction provided by PowerPoint, verbal discussion, and written material to support subject matter. There is an overview of "What is Oxygen" and "Why do we need it".  Instructor also reviews how to create a safe environment for oxygen use, the importance of using oxygen as prescribed, and the risks of noncompliance. There is a brief discussion on traveling with oxygen and resources the patient may utilize. Flowsheet Row PULMONARY REHAB OTHER RESPIRATORY from 04/15/2021 in [place]  Date 02/25/21  Educator Handout      Oxygen Equipment:  -Group instruction provided by [facility] Staff utilizing handouts, written materials, and equipment demonstrations. Flowsheet Row PULMONARY REHAB OTHER RESPIRATORY from 02/08/2018 in [place]  Date 02/08/18  Educator [place]/[employer]  Instruction Review Code 2- Demonstrated Understanding      Signs and Symptoms:  -Group instruction provided by written material and verbal discussion to support subject matter. Warning  signs and symptoms of infection, stroke, and heart attack are reviewed and when to call the physician/911 reinforced. Tips for preventing the spread of infection discussed.   Advanced Directives:  -Group instruction provided by verbal instruction and written material to support subject matter. Instructor reviews Advanced Directive laws and proper instruction for filling out document.   Pulmonary Video:  -Group video education that reviews the importance of medication and oxygen compliance, exercise, good nutrition, pulmonary hygiene, and pursed lip and diaphragmatic breathing for the pulmonary patient.   Exercise for the Pulmonary Patient:  -Group instruction that is supported by a PowerPoint presentation. Instructor discusses benefits of exercise, core components of exercise, frequency, duration, and intensity of an exercise routine, importance of utilizing pulse oximetry during exercise, safety while exercising, and options of places to exercise outside of rehab.     Pulmonary Medications:  -Verbally interactive group education provided by instructor with focus on inhaled medications and proper administration.   Anatomy and Physiology of the Respiratory System and Intimacy:  -Group instruction provided by PowerPoint, verbal discussion, and written material to support subject matter. Instructor reviews respiratory cycle and anatomical components of the respiratory system and their functions. Instructor also reviews differences in obstructive and restrictive respiratory diseases with examples of each. Intimacy, Sex, and Sexuality differences are reviewed with a discussion on how relationships can change when diagnosed with pulmonary disease. Common sexual concerns are reviewed. Flowsheet Row PULMONARY REHAB OTHER RESPIRATORY from 04/15/2021 in [place]  Date 03/04/21  Educator Handout      MD DAY -A group question and answer session with a medical doctor  that allows participants to ask questions that relate to their pulmonary disease state. Flowsheet Row PULMONARY REHAB OTHER RESPIRATORY from 02/08/2018 in [place]  Date 01/04/18  Educator [place] -  Group or individual verbal, written, or video instructions that support the educational goals of the pulmonary rehab program. Win[place] from 04/15/2021 in [place]  Date 03/25/21  [Beat a Sedentary Lifestyle]  Educator handout      Holiday Eating Survival Tips:  -Group instruction provided by PowerPoint slides, verbal discussion, and written materials to support subject matter. The instructor gives patients tips, tricks, and techniques to help them not only survive but enjoy the holidays despite the onslaught of food that accompanies the holidays.   Knowledge Questionnaire Score:  Knowledge Questionnaire Score - 02/15/21 1137      Knowledge Questionnaire Score   Pre Score 17/18           Core Components/Risk Factors/Patient Goals at Admission:  Personal Goals and Risk Factors at Admission - 02/15/21 1127      Core Components/Risk Factors/Patient Goals on Admission    Weight Management Obesity    Improve shortness of breath with ADL's Yes    Intervention Provide education, individualized exercise plan and daily activity instruction to help decrease symptoms of SOB with activities of daily living.    Expected Outcomes Short Term: Improve cardiorespiratory fitness to achieve a reduction of symptoms when performing ADLs;Long Term: Be able to perform more ADLs without symptoms or delay the onset of symptoms           Core Components/Risk Factors/Patient Goals Review:   Goals and Risk Factor Review    Row Name 02/15/21 1137 02/22/21 1352 03/22/21 1349 04/15/21 0844       Core Components/Risk Factors/Patient Goals Review   Personal Goals Review Weight  Management/Obesity;Develop more efficient breathing techniques such as purse lipped breathing and diaphragmatic breathing and practicing self-pacing with activity.;Increase knowledge of respiratory medications and ability to use respiratory devices properly.;Improve shortness of breath with ADL's Develop more efficient breathing techniques such as purse lipped breathing and diaphragmatic breathing and practicing self-pacing with activity.;Increase knowledge of respiratory medications and ability to use respiratory devices properly.;Improve shortness of breath with ADL's Develop more efficient breathing techniques such as purse lipped breathing and diaphragmatic breathing and practicing self-pacing with activity.;Increase knowledge of respiratory medications and ability to use respiratory devices properly.;Improve shortness of breath with ADL's Develop more efficient breathing techniques such as purse lipped breathing and diaphragmatic breathing and practicing self-pacing with activity.;Increase knowledge of respiratory medications and ability to use respiratory devices properly.;Improve shortness of breath with ADL's    Review -- Has not started pulmonary rehab yet, will begin exercising 02/23/2021. Collie Siad is progressing well in pulmonary reha, she is exercising @ 1.9 mets on the nustep and walking 9 laps in 10 minutes on the track. Collie Siad graduates in 1 week, has increased her workloads slowly, has learned what is appropriate for her re: exercise.    Expected Outcomes -- See admission goals. See admission goals. To continue exercising on her own after graduation.           Core Components/Risk Factors/Patient Goals at Discharge (Final Review):   Goals and Risk Factor Review - 04/15/21 0844      Core Components/Risk Factors/Patient Goals Review   Personal Goals Review Develop more efficient breathing techniques such as purse lipped breathing and diaphragmatic breathing and practicing self-pacing with  activity.;Increase knowledge of respiratory medications and ability to use respiratory devices properly.;Improve shortness of breath with ADL's    Review Collie Siad graduates in 1 week, has increased her workloads slowly, has learned what is appropriate for  her re: exercise.    Expected Outcomes To continue exercising on her own after graduation.           ITP Comments:   Comments: ITP REVIEW Pt is making expected progress toward pulmonary rehab goals after completing 15 sessions. Recommend continued exercise, life style modification, education, and utilization of breathing techniques to increase stamina and strength and decrease shortness of breath with exertion.

## 2021-04-21 NOTE — Telephone Encounter (Signed)
I indicated yes on the 2 papers that came to me. I did it last week. But has not been picked up yet. You can come and get it - is on my right desk

## 2021-04-21 NOTE — Telephone Encounter (Signed)
Got the papers from MR office upstairs and these have been faxed back to Pulmonary rehab with MR signature.  Nothing further is needed.

## 2021-04-22 ENCOUNTER — Other Ambulatory Visit: Payer: Self-pay

## 2021-04-22 ENCOUNTER — Encounter (HOSPITAL_COMMUNITY)
Admission: RE | Admit: 2021-04-22 | Discharge: 2021-04-22 | Disposition: A | Discharge status: Home / Self Care | Payer: Medicare Other | Source: Ambulatory Visit | Attending: Internal Medicine | Admitting: Internal Medicine

## 2021-04-22 DIAGNOSIS — J849 Interstitial pulmonary disease, unspecified: Secondary | ICD-10-CM | POA: Diagnosis not present

## 2021-04-30 NOTE — Addendum Note (Signed)
Encounter addended by: George Ina, RD on: 04/30/2021 3:04 PM  Actions taken: Flowsheet accepted

## 2021-05-03 NOTE — Progress Notes (Signed)
Discharge Progress Report  Patient Details  Name: Patty [name] MRN: [id number] Date of Birth: 12/07/1951 Referring Provider:   [facility] Walk Test from 02/15/2021 in [place]  Referring Provider Dr. Chase Caller       Number of Visits: 17  Reason for Discharge:  Patient reached a stable level of exercise. Patient independent in their exercise. Patient has met program and personal goals.  Smoking History:  Social History   Tobacco Use  Smoking Status Former Smoker  . Packs/day: 1.00  . Years: 45.00  . Pack years: 45.00  . Types: Cigarettes  . Quit date: 12/26/2008  . Years since quitting: 12.3  Smokeless Tobacco Never Used    Diagnosis:  Interstitial lung disease ([place])  ADL [facility]:  Pulmonary Assessment Scores    Row Name 02/15/21 1140 02/15/21 1150 02/15/21 1523     ADL [facility]   ADL Phase Entry Entry Entry   SOB Score total 39 39 --     CAT Score   CAT Score 9 9 --     mMRC Score   mMRC Score -- -- 3   Row Name 04/20/21 1541 04/23/21 0827       ADL [facility]   ADL Phase Exit Exit    SOB Score total 23 --         CAT Score   CAT Score 16 --         mMRC Score   mMRC Score -- 3           Initial Exercise Prescription:  Initial Exercise Prescription - 02/15/21 1500      Date of Initial Exercise RX and Referring Provider   Date 02/15/21    Referring Provider Dr. Chase Caller    Expected Discharge Date 04/22/21      Oxygen   Oxygen Continuous    Liters 3-4      NuStep   Level 1    SPM 80    Minutes 30      Prescription Details   Frequency (times per week) 2    Duration Progress to 45 minutes of aerobic exercise without signs/symptoms of physical distress      Intensity   THRR 40-80% of Max Heartrate 60-120    Ratings of Perceived Exertion 11-13    Perceived Dyspnea 0-4      Progression   Progression Continue to progress workloads to maintain intensity without signs/symptoms of physical  distress.      Resistance Training   Training Prescription Yes    Weight orange bands    Reps 10-15           Discharge Exercise Prescription (Final Exercise Prescription Changes):  Exercise Prescription Changes - 04/13/21 1500      Response to Exercise   Blood Pressure (Admit) 128/80    Blood Pressure (Exercise) 112/70    Blood Pressure (Exit) 114/80    Heart Rate (Admit) 93 bpm    Heart Rate (Exercise) 142 bpm    Heart Rate (Exit) 114 bpm    Oxygen Saturation (Admit) 98 %    Oxygen Saturation (Exercise) 89 %    Oxygen Saturation (Exit) 96 %    Rating of Perceived Exertion (Exercise) 12    Perceived Dyspnea (Exercise) 2    Duration Continue with 30 min of aerobic exercise without signs/symptoms of physical distress.    Intensity THRR unchanged      Progression   Progression Continue to progress workloads to  maintain intensity without signs/symptoms of physical distress.      Resistance Training   Training Prescription Yes    Weight orange bands    Reps 10-15    Time 10 Minutes      Oxygen   Oxygen Continuous    Liters 3-6      NuStep   Level 5    SPM 80    Minutes 20    METs 1.7      Track   Laps 8    Minutes 10           Functional Capacity:  6 Minute Walk    Row Name 02/15/21 1224 04/23/21 0727 04/23/21 0823     6 Minute Walk   Phase Initial Discharge Discharge   Distance 715 feet -- 919 feet   Distance % Change -- -- 28.53 %   Distance Feet Change -- -- 204 ft   Walk Time 6 minutes -- 6 minutes   # of Rest Breaks 0 -- 1  1 seated break at 3 min for 30 seconds   MPH 1.35 -- 1.74   METS 1.81 -- 1.84   RPE 14 -- 13   Perceived Dyspnea  3 -- 1   VO2 Peak 6.33 -- 6.46   Symptoms Yes (comment) -- No   Comments Oxygen saturation dropped below 88% twice and had to increase to 4L with exertion -- --   Resting HR 99 bpm -- 82 bpm   Resting BP 102/70 -- 136/82   Resting Oxygen Saturation  96 % -- 99 %   Exercise Oxygen Saturation  during 6 min  walk 86 % -- 93 %   Max Ex. HR 136 bpm -- 130 bpm   Max Ex. BP 182/74 -- 156/78   2 Minute Post BP 140/76 -- 128/76     Interval HR   1 Minute HR 101 -- 110   2 Minute HR 101 -- 118   3 Minute HR 124 -- 130   4 Minute HR 128 -- 122   5 Minute HR 125 -- 119   6 Minute HR 136 -- 130   2 Minute Post HR 102 -- 88   Interval Heart Rate? Yes -- --     Interval Oxygen   Interval Oxygen? Yes -- --   Baseline Oxygen Saturation % 96 % -- 99 %   1 Minute Oxygen Saturation % 91 % -- 98 %   1 Minute Liters of Oxygen 2 L -- 6 L   2 Minute Oxygen Saturation % 86 % -- 96 %   2 Minute Liters of Oxygen 2 L -- 6 L   3 Minute Oxygen Saturation % 92 % -- 94 %   3 Minute Liters of Oxygen 3 L -- 6 L   4 Minute Oxygen Saturation % 87 % -- 97 %   4 Minute Liters of Oxygen 3 L -- 6 L   5 Minute Oxygen Saturation % 91 % -- 95 %   5 Minute Liters of Oxygen 4 L -- 6 L   6 Minute Oxygen Saturation % 89 % -- 93 %   6 Minute Liters of Oxygen 4 L -- 6 L   2 Minute Post Oxygen Saturation % 98 % -- 99 %   2 Minute Post Liters of Oxygen 4 L -- 6 L          Psychological, QOL, Others - Outcomes: PHQ 2/9: Depression screen [facility] 2/9 04/22/2021  04/22/2021 02/15/2021 12/03/2020 04/29/2020  Decreased Interest 1 0 0 0 0  Down, Depressed, Hopeless 0 0 0 1 0  PHQ - 2 Score 1 0 0 1 0  Altered sleeping - 3 1 - 3  Tired, decreased energy - 1 1 - 0  Change in appetite - 0 0 - 0  Feeling bad or failure about yourself  - 0 0 - 0  Trouble concentrating - 1 0 - 3  Moving slowly or fidgety/restless - 0 0 - 0  Suicidal thoughts - 0 0 - 0  PHQ-9 Score - 5 - - 6  Difficult doing work/chores - Somewhat difficult Not difficult at all - Somewhat difficult  Some recent data might be hidden    Quality of Life:   Personal Goals: Goals established at orientation with interventions provided to work toward goal.  Personal Goals and Risk Factors at Admission - 02/15/21 1127      Core Components/Risk Factors/Patient Goals on  Admission    Weight Management Obesity    Improve shortness of breath with ADL's Yes    Intervention Provide education, individualized exercise plan and daily activity instruction to help decrease symptoms of SOB with activities of daily living.    Expected Outcomes Short Term: Improve cardiorespiratory fitness to achieve a reduction of symptoms when performing ADLs;Long Term: Be able to perform more ADLs without symptoms or delay the onset of symptoms            Personal Goals Discharge:  Goals and Risk Factor Review    Row Name 02/15/21 1137 02/22/21 1352 03/22/21 1349 04/15/21 0844       Core Components/Risk Factors/Patient Goals Review   Personal Goals Review Weight Management/Obesity;Develop more efficient breathing techniques such as purse lipped breathing and diaphragmatic breathing and practicing self-pacing with activity.;Increase knowledge of respiratory medications and ability to use respiratory devices properly.;Improve shortness of breath with ADL's Develop more efficient breathing techniques such as purse lipped breathing and diaphragmatic breathing and practicing self-pacing with activity.;Increase knowledge of respiratory medications and ability to use respiratory devices properly.;Improve shortness of breath with ADL's Develop more efficient breathing techniques such as purse lipped breathing and diaphragmatic breathing and practicing self-pacing with activity.;Increase knowledge of respiratory medications and ability to use respiratory devices properly.;Improve shortness of breath with ADL's Develop more efficient breathing techniques such as purse lipped breathing and diaphragmatic breathing and practicing self-pacing with activity.;Increase knowledge of respiratory medications and ability to use respiratory devices properly.;Improve shortness of breath with ADL's    Review -- Has not started pulmonary rehab yet, will begin exercising 02/23/2021. Collie Siad is progressing well in pulmonary  reha, she is exercising @ 1.9 mets on the nustep and walking 9 laps in 10 minutes on the track. Collie Siad graduates in 1 week, has increased her workloads slowly, has learned what is appropriate for her re: exercise.    Expected Outcomes -- See admission goals. See admission goals. To continue exercising on her own after graduation.           Exercise Goals and Review:  Exercise Goals    Row Name 02/15/21 1223             Exercise Goals   Increase Physical Activity Yes       Intervention Provide advice, education, support and counseling about physical activity/exercise needs.;Develop an individualized exercise prescription for aerobic and resistive training based on initial evaluation findings, risk stratification, comorbidities and participant's personal goals.       Expected Outcomes  Short Term: Attend rehab on a regular basis to increase amount of physical activity.;Long Term: Add in home exercise to make exercise part of routine and to increase amount of physical activity.;Long Term: Exercising regularly at least 3-5 days a week.       Increase Strength and Stamina Yes       Intervention Provide advice, education, support and counseling about physical activity/exercise needs.;Develop an individualized exercise prescription for aerobic and resistive training based on initial evaluation findings, risk stratification, comorbidities and participant's personal goals.       Expected Outcomes Short Term: Increase workloads from initial exercise prescription for resistance, speed, and METs.;Short Term: Perform resistance training exercises routinely during rehab and add in resistance training at home;Long Term: Improve cardiorespiratory fitness, muscular endurance and strength as measured by increased METs and functional capacity (6MWT)       Able to understand and use rate of perceived exertion (RPE) scale Yes       Intervention Provide education and explanation on how to use RPE scale       Expected  Outcomes Short Term: Able to use RPE daily in rehab to express subjective intensity level;Long Term:  Able to use RPE to guide intensity level when exercising independently       Able to understand and use Dyspnea scale Yes       Intervention Provide education and explanation on how to use Dyspnea scale       Expected Outcomes Short Term: Able to use Dyspnea scale daily in rehab to express subjective sense of shortness of breath during exertion;Long Term: Able to use Dyspnea scale to guide intensity level when exercising independently       Knowledge and understanding of Target Heart Rate Range (THRR) Yes       Intervention Provide education and explanation of THRR including how the numbers were predicted and where they are located for reference       Expected Outcomes Short Term: Able to state/look up THRR;Long Term: Able to use THRR to govern intensity when exercising independently;Short Term: Able to use daily as guideline for intensity in rehab       Understanding of Exercise Prescription Yes       Intervention Provide education, explanation, and written materials on patient's individual exercise prescription       Expected Outcomes Short Term: Able to explain program exercise prescription;Long Term: Able to explain home exercise prescription to exercise independently              Exercise Goals Re-Evaluation:  Exercise Goals Re-Evaluation    Row Name 02/22/21 1445 03/23/21 1546 04/19/21 0909         Exercise Goal Re-Evaluation   Exercise Goals Review Increase Physical Activity;Increase Strength and Stamina;Able to understand and use rate of perceived exertion (RPE) scale;Able to understand and use Dyspnea scale;Knowledge and understanding of Target Heart Rate Range (THRR);Understanding of Exercise Prescription Increase Physical Activity;Increase Strength and Stamina;Able to understand and use rate of perceived exertion (RPE) scale;Able to understand and use Dyspnea scale;Knowledge and  understanding of Target Heart Rate Range (THRR);Understanding of Exercise Prescription Increase Physical Activity;Increase Strength and Stamina;Able to understand and use rate of perceived exertion (RPE) scale;Able to understand and use Dyspnea scale;Knowledge and understanding of Target Heart Rate Range (THRR);Understanding of Exercise Prescription     Comments Patient's first day of exercise is this week. It is too early to note any progression. Will continue to monitor and progress as she is able. Patient has  completed 8 exercise sessions and has been steady with workload and MET level increases. We have altered her exercise prescription to doing the Nustep for 20 minutes and walking the track with a rolator for 10 minutes. Her first 4 sessions consisted of her doing the Nustep for 30 minutes with no walking, but have since encouraged walking for at least 10 minutes. Pt has tolerated this well so far with no complaints. She is exercising at 2.1 METS on level 4 of the Nustep. Will continue to monitor and progress as she is able. Tashauna has completed 15 exercise sessions and has been consistent with workload and MET increases on the Nustep. Pt had been doing 30 minutes on the Nustep but then mentioned wanting to work on walking. We started her out walking 10 minutes on the track and she has tolerated that pretty well. She now does 20 minutes on the Nustep and 10 minutes walking the track. She is exercising at 2.2 METS on the Nustep and she is walking 8 laps in 10 minutes on the track. Will continue to monitor and progress as she is able. She is scheduled graduate from the program this week and we will give her a graduation exercise prescription.     Expected Outcomes Through exercise at rehab and home the patient will decrease shortness of breath with daily activities and feel confident in carrying out an exercise regimn at home. Through exercise at rehab and home, the patient will decrease shortness of breath  with daily activities and feel confident in carrying out an exercise regimn at home. For pt to continue exercise after completion of pulmonary rehab. To also continue monitoring oxygen saturation at rest and with exertion at home.            Nutrition & Weight - Outcomes:  Pre Biometrics - 02/15/21 1116      Pre Biometrics   Height 5' 4" (1.626 m)    Weight 116.8 kg    BMI (Calculated) 44.18    Grip Strength 30 kg           Post Biometrics - 04/23/21 0731       Post  Biometrics   Grip Strength 30 kg           Nutrition:  Nutrition Therapy & Goals - 03/03/21 1035      Nutrition Therapy   Diet Low sodium      Personal Nutrition Goals   Nutrition Goal Pt to identify food quantities necessary to achieve weight loss of 6-24 lb at graduation from cardiac rehab.    Personal Goal #2 Pt to build a healthy plate including vegetables, fruits, whole grains, and low-fat dairy products in a heart healthy meal plan.      Intervention Plan   Intervention Prescribe, educate and counsel regarding individualized specific dietary modifications aiming towards targeted core components such as weight, hypertension, lipid management, diabetes, heart failure and other comorbidities.    Expected Outcomes Short Term Goal: Understand basic principles of dietary content, such as calories, fat, sodium, cholesterol and nutrients.           Nutrition Discharge:   Education Questionnaire Score:  Knowledge Questionnaire Score - 04/20/21 1541      Knowledge Questionnaire Score   Post Score 16/18           Goals reviewed with patient; copy given to patient.

## 2021-05-14 DIAGNOSIS — Z23 Encounter for immunization: Secondary | ICD-10-CM | POA: Diagnosis not present

## 2021-05-19 ENCOUNTER — Telehealth: Payer: Self-pay | Admitting: Internal Medicine

## 2021-05-19 NOTE — Telephone Encounter (Signed)
Note added to the OV note so that pt will have her qualifying walk when she comes in on 05/31/21

## 2021-05-31 ENCOUNTER — Ambulatory Visit (INDEPENDENT_AMBULATORY_CARE_PROVIDER_SITE_OTHER): Payer: Medicare Other | Admitting: Internal Medicine

## 2021-05-31 ENCOUNTER — Encounter: Payer: Self-pay | Admitting: Internal Medicine

## 2021-05-31 ENCOUNTER — Ambulatory Visit: Payer: Medicare Other | Admitting: Internal Medicine

## 2021-05-31 ENCOUNTER — Other Ambulatory Visit: Payer: Self-pay

## 2021-05-31 VITALS — BP 128/80 | HR 74 | Ht 64.0 in | Wt 259.8 lb

## 2021-05-31 DIAGNOSIS — R0600 Dyspnea, unspecified: Secondary | ICD-10-CM | POA: Diagnosis not present

## 2021-05-31 DIAGNOSIS — I2723 Pulmonary hypertension due to lung diseases and hypoxia: Secondary | ICD-10-CM

## 2021-05-31 DIAGNOSIS — J849 Interstitial pulmonary disease, unspecified: Secondary | ICD-10-CM | POA: Diagnosis not present

## 2021-05-31 DIAGNOSIS — Z9989 Dependence on other enabling machines and devices: Secondary | ICD-10-CM

## 2021-05-31 DIAGNOSIS — G4733 Obstructive sleep apnea (adult) (pediatric): Secondary | ICD-10-CM | POA: Diagnosis not present

## 2021-05-31 DIAGNOSIS — J9611 Chronic respiratory failure with hypoxia: Secondary | ICD-10-CM

## 2021-05-31 DIAGNOSIS — R0609 Other forms of dyspnea: Secondary | ICD-10-CM

## 2021-05-31 NOTE — Addendum Note (Signed)
Addended by: Lorretta Harp on: 05/31/2021 03:29 PM   Modules accepted: Orders

## 2021-05-31 NOTE — Progress Notes (Signed)
_0  ID: Patty [name], female    DOB: 03-07-1951, 70 y.o.   MRN: [id number]  Chief Complaint  Patient presents with  . Follow-up    dyspnea     Referring provider: No ref. provider found  HPI: 70 year old female former smoker seen for pulmonary consulokay guarded because then maybe they can meet him right now for the registryt 05/09/2017 for progressive dyspnea since 2004.[name] Patient has polycythemia vera followed by hematology and [place] She has Crohn's disease    IOV  05/09/2017  Chief Complaint  Patient presents with  . Pulmonary Consult    Pt referred for SOB with activity x years. Pt states over the last year the DOE has worsened. Pt denies cough and CP/tightness and f/c/s.     70 year old obese female. In 2004 Started noticing insidious onset of shortness of breath. In 2005 more [place] from [place] and probably gain over 60 pounds of weight. She has extensive workup at that time according to her history. She was then diagnosed with polycythemia by Dr. [name] at [facility]. She does not recollect any phlebotomies for this. However in the last 4-5 years she's had worsening shortness of breath. Noticeable with exertion particularly in the gym and doing standing exercises but not so much with sitting exercises. Relieved by rest. When she does some yard work she notices some associated wheezing as well.  Walking desaturation test in the office she did desaturate to 88% and did get tachycardic. She says that a chest x-ray done by primary care physicians interstitial pulmonary fibrosis. I personally visualized the chest x-ray done 04/03/2017: There is some interstitial markings but it is obscured by her obesity. I'm not so certain that is definite ILD. There is no lab work in the record. There is no PFT a CT scan available for visualization     05/29/2017 Follow up : Dyspnea  Patient returns for a two-week follow-up. Patient was seen for pulmonary consult  05/09/2017 for progressive dyspnea since 2004. Patient was set up for a high resolution CT chest that showed a very mild basilar predominant subpleural reticulation and bronchiectasis which could be due to nonspecific interstitial pneumonitis.. CT did have incidental findings of enlarged pulmonary arteries and aortic and coronary calcification. Patient had pulmonary function test on May 23 that showed an FEV1 at 77%, ratio 73, FVC 81, no significant bronchodilator response, total lung capacity 100%, DLCO 52%. Overnight oximetry test did show significant desaturations. We discussed beginning oxygen at bedtime.  Patient denies significant cough. Says that she get short of breath with walking. Has daytime fatigue and low energy..  Interstitial lung disease patient questionnaire was completed as follows Patient has no previous use of Macrodantin, amiodarone, methotrexate. She has been treated with prednisone for her Crohn's in the past. Patient has had extensive travel with brief visits to multiple state and country's.. [place]. Did live in a apartment that she did notice mold for 4 years. She does have Crohn's disease and was on Mesamaline for many years. Patient says she has had a humidifier and hot tub and previous house. She has no indoor birds. She does have a dog. Patient says she was told that she had a blood clot in the past but was never found.   OV 08/15/2017  Chief Complaint  Patient presents with  . Follow-up    Pt still gets occ. SOB. Other than that, pt states that she has been doing  good. Denies any cough or CP.     Follow-up multifactorial dyspnea with interstitial lung disease concern.   Regarding interstitial lung disease concern: Mid May 2018 she had high resolution CT chest that showed interstitial lung disease very mild but unclear if possible UIP pattern. Autoimmune test was negative. Isolated reduction in diffusion capacity 50%. [employer] of chest  physicians questionnaire shows previous Crohn's disease and also mold exposure previously. Overall she feels stable  Regarding dyspnea: She underwent echocardiogram in June 2018 showed grade 1 diastolic dysfunction. Had abnormal cardiac stress test July 2018 followed by cardiac cath early August 2018 that showed normal coronary angiogram but elevated left ventricular end-diastolic pressure. Dietary therapy has been recommended but she is unaware of these results.  Overall she is here to discuss these results.  IMPRESSION: 1. Suspect very mild basilar predominant subpleural reticulation and bronchiolectasis, which may be due to nonspecific interstitial pneumonitis. 2. Aortic atherosclerosis (ICD10-170.0). Coronary artery calcification. 3. Enlarged pulmonary arteries, indicative of pulmonary arterial hypertension.   Electronically Signed   By: Lorin Picket M.D.   On: 05/13/2017 07:33   OV 10/17/2017  Chief Complaint  Patient presents with  . Follow-up    Pt states that she has had good days and bad days since last visit. States that when she was in [place] for a month, breathing was better; still became SOB but not as bad as in [place]. Pt's SOB is mainly on exertion. Denies any cough or CP.    Follow-up dyspnea that is multifactorial due to obesity, physical deconditioning and diastolic dysfunction Follow-up mild interstitial lung disease not otherwise specified  Last visit I started her on Lasix. I was only supposed to see herin 6 months or so. However she's had problems tolerating Lasix and potassium. It fluctuates between palpitations and hypertension. She feels the palpitations might be related to potassium depletion after taking Lasix. But she also tells me that the Lasix does help her dyspnea. At this point in time her condition is to see cardiologist. Off note she did send the email message asking these questions on 09/28/2017 made a reply that she tells me that she never got  the reply.   OV 02/13/2018  Chief Complaint  Patient presents with  . Follow-up    PFT done 02/05/18.  Pt states she has been doing good. Was sick in january but states she is doing better.  DME: [facility] 4L pulse    Follow-up dyspnea that is multifactorial due to obesity, physical deconditioning and diastolic dysfunction Follow-up mild interstitial lung disease not otherwise specified v concern   70 year old female with concern for interstitial lung disease.  Since her last visit she continues to do well.  She is attending pulmonary rehabilitation.  She feels better and less short of breath.  She uses oxygen with exertion at rehab saying she needs it.  She has upcoming travel in summer to [place] and wanted some flight information oxygen form filled out.  She is wondering if mesalamine that she took several years ago was the cause of interstitial lung disease or her Crohn's disease.  But overall she is stable.  Pulmonary function test shows stability and FVC since last 1 year with some improvement in DLCO.  High-resolution CT scan of the chest interpreted by thoracic radiology report suspected ILD.   OV 08/28/2018  Chief Complaint  Patient presents with  . Follow-up    PFT performed today.  Pt states she has had both good and bad days since  last visit. Pt denies any complaints of cough, SOB, or CP but states she is having some problems with congestion since she has been back from vacation. Pt has been having problems with getting O2 supplies taken care of with DME.    Follow-up dyspnea that is multifactorial due to obesity, physical deconditioning and diastolic dysfunction Follow-up concern for  mild interstitial lung disease not otherwise specified  Patty [name] - Presents for follow-up for the above issues. Her dyspnea significantly better following a 30 pound weight loss and continued rehabilitation. She uses oxygen with rehabilitation. Today walking desaturation test 185 feet 3 laps on  room air: She dropped to 87% on the second lap. But she is feeling better. She had spirometry today that shows improvement in FVC concomitant with weight loss but no change in diffusion capacity that reflects the presence of ongoing possible ILD.She continues to lose more weight she believes that she could lose another 30 pounds before the next visit.     OV 03/12/2019  Subjective:  Patient ID: Patty [name], female , DOB: [birth year] , age 49 y.o. , MRN: [id number] , ADDRESS: [place] [id number]   03/12/2019 -   Chief Complaint  Patient presents with  . Follow-up    PFT performed today.  Pt states she has been doing good since last visit. States she still becomes SOB with exertion, and has phlegm in the mornings, postnasal drainage which has used flonase to help. Pt also uses POC as needed.    Follow-up dyspnea that is multifactorial due to obesity, physical deconditioning and diastolic dysfunction Follow-up  mild interstitial lung disease not otherwise specifieds -with stability 2018 through 2020 [non-IPF pattern]; on observation   HPI Kashonda Sarkisyan 71 y.o. -presents for follow-up.  She is attending pulmonary rehabilitation and using oxygen with exertion.  Overall she feels stable.  Subjective symptom parameters and objective parameters are documented below.  She had high-resolution CT chest that showed shows that she has ILD.  At this point we can be confident that she has mild ILD but it is stable based on symptoms walking test and pulmonary function test.  The previous autoimmune test was negative.    OV 10/07/2019  Subjective:  Patient ID: Patty [name], female , DOB: 1951-02-23 , age 9 y.o. , MRN: [id number] , ADDRESS: [place] [id number]   10/07/2019 -   Chief Complaint  Patient presents with  . Follow-up     Follow-up dyspnea that is multifactorial due to obesity, physical deconditioning and diastolic dysfunction Follow-up   mild interstitial lung disease not otherwise specifieds -with stability 2018 through 2020 [non-IPF pattern]; on observation Follow-up mild pulmonary emphysema present on high-resolution CT chest  HPI Kamee Bobst 70 y.o. -presents for follow-up.  Prior visit was pre-pandemic.  Since then she has been sedentary at home.  She tells me that she is more short of breath.  The symptom scores below reflect that.  She thinks is because of the 30 pound weight gain because of sedentary living and social isolation following the onset of the pandemic.  She is frustrated by this.  Deep down she thinks her ILD is stable.  Pulmonary function test shows a drop in FVC but stability and DLCO suggesting weight gain is an ongoing issue for worsening dyspnea and pulmonary function test.  She is willing to have a high-dose flu shot today.  She also tells me that she is not taking her Spiriva because it was  expensive.  Although it did help her she is willing to try again and try to price it.  She also stated that her mom who never smoked had emphysema but her dad smoked heavily.  She has never been tested for alpha-1.   [place] -dropped. But tries not to use it in public    IMPRESSION: 1. Very mild scattered basilar subpleural reticulation and ground-glass similar to 01/29/2018. Nonspecific interstitial pneumonitis can not be excluded. Findings are indeterminate for UIP per consensus guidelines: Diagnosis of Idiopathic Pulmonary Fibrosis: An Official ATS/ERS/JRS/ALAT Clinical Practice Guideline. [place], Iss 5, [id number], Aug 26 2017. 2. Question cirrhosis. 3. Aortic atherosclerosis (ICD10-170.0). Coronary artery calcification. 4. Enlarged arteries, arterial hypertension. 5.  Emphysema (ICD10-J43.9).   Electronically Signed   By: Lorin Picket M.D.   On: 03/06/2019 16:59   ROS - per HPI     OV 06/23/2020  Subjective:  Patient ID: Patty [name], female  , DOB: 13-Apr-1951 , age 87 y.o. , MRN: [id number] , ADDRESS: [place] [id number]   06/23/2020 -   Chief Complaint  Patient presents with  . Follow-up    shortness of breath with exertion   Follow-up dyspnea that is multifactorial due to obesity, physical deconditioning and diastolic dysfunction  Follow-up  mild interstitial lung disease not otherwise specifieds -with stability 2018 through 2020 [non-IPF pattern/indeterminate   -; on observation  - last CT Mach 2020 0> July 2021 wthout progression  Follow-up mild pulmonary emphysema present on high-resolution CT chest - alpha 1 MM  - o2 with exertion  - advised spiriva oct 2020 but too expensive  HPI Jamarie Joplin 70 y.o. -returns for routine follow-up.  She says she has been doing well.  She uses portable oxygen cranks it up to 4 L - 5 L and then walks every day for 30 minutes.  Overall she feels stable.  Her symptom score itself is stable.  However on pulmonary function testing her FVC shows a decline.  This decline started in October 2020 and since then it is stable.  She attributes this to weight gain.  There is what she told me last time as well.  However when we walked her she seemed to desaturate much more easily.  We also needed to corrected desaturation at 5 L.  She thinks it is because the portable oxygen system is not delivering oxygen currently and is a technical issue with a portable machine.  However the distance of desaturation seems to worsen.  There is no leg swelling although she has some mild varicose veins at baseline.  She is not on nighttime oxygen and she recollects attest that this some years ago and was normal back then.  Currently she is only using portable oxygen.  Her last echocardiogram was few years ago.  There is no hemoptysis or worsening cough.  She got a Covid vaccine in February 2021 and she thinks things might of changed since then.   SYMPTOM SCALE - ILD 03/12/2019  10/07/2019 30# wt  gain  06/23/2020 250#  O2 use RA ra   Shortness of Breath 0 -> 5 scale with 5 being worst (score 6 If unable to do)    At rest 0 0 0  Simple tasks - showers, clothes change, eating, shaving 0 0   Household (dishes, doing bed, laundry) 0 0 0  Shopping 0 0 0  Walking at own pace 0 4.5 4.5  Walking up  Stairs 1-_0 Total (40 - 48) Dyspnea Score 2 9.5 9.5  How bad is your cough? 0 0 0  How bad is your fatigue 0 1 0  nausea   0  vomit   0  diarrhe   0  axniety   3  depression   0        Simple office walk 185 feet x  3 laps goal with forehead probe 03/12/2019  10/07/2019  06/23/2020   O2 used Room air Room air Room air  Number laps completed 3 2nd lap 1 lap  Comments about pace slow slow slow  Resting Pulse Ox/HR 100% and 85/min 96% and 72/min 97% and 77  Final Pulse Ox/HR 88% and 135/min 86% and 105/min 87% and 112/min  Desaturated </= 88% yes    Desaturated <= 3% points Yes, 12    Got Tachycardic >/= 90/min yes    Symptoms at end of test Mild dyspnea  Corrected with 5L [place]. Even with 4L  downt to  87%  Miscellaneous comments x  ? worse    Results for CODIE, HAINER ANN (MRN [id number]) as of 06/23/2020 11:49  Ref. Range 05/17/2017 13:11 02/05/2018 12:14 08/28/2018 11:22 03/12/2019 14:47 10/01/2019 15:58 30 pound weight gain 06/09/2020 11:25  FVC-Pre Latest Units: L 2.61 2.57 2.80 2.73 2.38 2.42  FVC-%Pred-Pre Latest Units: % 84 83 94 91 81 82  FEV1-Pre Latest Units: L 1.83 1.71 1.96 1.83 1.65 1.53   Results for PAOLA, ALESHIRE ANN (MRN [id number]) as of 06/23/2020 11:49  Ref. Range 05/17/2017 13:11 02/05/2018 12:14 08/28/2018 11:22 03/12/2019 14:47 10/01/2019 15:58 06/09/2020 11:25  DLCO unc Latest Units: ml/min/mmHg 12.64 15.15 14.53 13.77 14.39 13.48  DLCO unc % pred Latest Units: % 52 62 62 72 75 70    ROS - per HPI     OV 02/23/2021  Subjective:  Patient ID: Patty [name], female , DOB: 1951/02/27 , age 29 y.o. , MRN: [id number] , ADDRESS: [place] [id number] PCP [name], MD Patient Care Team: [name], MD as PCP - General (Family Medicine) Pyrtle, Lajuan Lines, MD as Consulting Physician (Gastroenterology) Shamleffer, Melanie Crazier, MD as Consulting Physician (Endocrinology) Brand Males, MD as Consulting Physician (Pulmonary Disease)  This Provider for this visit: Treatment Team:  Attending Provider: Brand Males, MD   Follow-up dyspnea that is multifactorial due to obesity, physical deconditioning and diastolic dysfunction, ILD and new dx Group 3 PAH - dec 2021  45 pppd prior smoking hix  Follow-up  mild interstitial lung disease not otherwise specifieds -with stability 2018 through 2020 [non-IPF pattern/indeterminate   -; on observation  - last CT Mach 2020 - > July 2021 without progression  Inconsisent dxx of  pulmonary emphysema present on high-resolution CT chest - alpha 1 MM  - o2 with exertion  - advised spiriva oct 2020 but too expensive  Reported in only 1 CT. No mention in July 2021 CT  WHO group 3 Pulm htn with elevated PCWD - [facility] 12/07/20:  RA 13/12 mean 9 mmHg RV 48/8, EDP 12 PA 50/29 mean 38 PCWP 22/20 mean 20  O2 saturations: PA 62, Ao 97, SVC 64  CO 4.26 L/m, CI 1.96 Trans-pulmonic gradient 18 mmHg PVR 4.2 Woods Units  Grade 1 Disast dysfhn  - reportred July 2021 echo  Obesity  02/23/2021 -   Chief Complaint  Patient presents with  . Follow-up    Doing ok  HPI Patty [name] 70 y.o. -returns for 62-monthfollow-up.  She has inconsistent diagnosis of emphysema.  Was reported in 1 scan but not another scans.  She has ILD.  She is foregoing biopsy.  She tells me overall that she is stable.  Although there are some weight gain.  She feels in the interim Metformin messed up her GI system and cause weight gain but she is glad to be out of it.  She had 6-minute walk test on 02/15/2021 at pulmonary rehabilitation.  She walked 7 and 15 feet in 6 minutes.   She did not break at all.  Her pulse ox dropped below 88% 2 times.  She corrected with 4 L of oxygen.  Her resting oxygen saturation was 96%.  And lowest was 86%.  She also had right heart catheterization in December 2021.  She has elevated pulmonary pressures but also elevated wedge.  Cardiology is following this up and she is on diuresis.  We spent some time discussing her care option.  She had pulmonary function test that shows stability in the last year and a half but declined in 2 years time.  Her high-resolution CT scan of the chest done in summer 2021 shows stability over 4 years.  She is not interested in antifibrotic's because of her colitis and also given her stability.  She does not want to go through pulmonary lung biopsy given the risks  She generally tries to avoid medications.  She is happy going to pulmonary rehabilitation.  We discussed inhaled treprostinil as approved therapy for pulmonary hypertension -expressed to her the overall safety profile for over 20 years and approved recently for pulmonary hypertension and WHO group 3.  Explained the minimal side effect risk but she does not want to go through this  We discussed the option of participating in a clinical trial.  We discussed what is called the PULSE inhaled nitric oxide device study.  This is a 28-week plus study.  It was a device and a nitric oxide supply study.  She is actually interested in this.  She is participating clinical trials.  She understands the voluntary nature of this.  She understands the risks that come with clinical trials.  She understands the control of risk through close follow-up and interventions..  However her 6-minute walk test is quite adequate and also she starting pulmonary rehabilitation so she would not be able to participate in this trial for several months.  In addition she is aware that inhaled treprostinil which is standard of care therapy is currently on exclusion in this protocol current  amendment.  She understands if she qualifies and chooses this trial that this would be a limitation.  We have given her the consent for review but at this time she does not qualify for this trial.  HRCT July 2021   IMPRESSION: 1. Spectrum of findings suggestive of a mild basilar predominant fibrotic interstitial lung disease without appreciable interval progression since baseline 05/12/2017 high-resolution chest CT. Favor NSIP, with UIP not excluded. Findings are indeterminate for UIP per consensus guidelines: Diagnosis of Idiopathic Pulmonary Fibrosis: An Official ATS/ERS/JRS/ALAT Clinical Practice Guideline. [place] Iss 5, [phone] Aug 26 2017. 2. Dilated main pulmonary artery, stable, suggesting chronic pulmonary arterial hypertension. 3. Aortic Atherosclerosis (ICD10-I70.0).   Electronically Signed   By: JIlona SorrelM.D.   On: 07/10/2020 15:50    OV 05/31/2021  Subjective:  Patient ID: SGuinevere [name] female , DOB: 904/26/1952,  age 82 y.o. , MRN: [id number] , ADDRESS: [place] [id number] PCP [name], MD Patient Care Team: [name], MD as PCP - General (Family Medicine) Pyrtle, Lajuan Lines, MD as Consulting Physician (Gastroenterology) Shamleffer, Melanie Crazier, MD as Consulting Physician (Endocrinology) Brand Males, MD as Consulting Physician (Pulmonary Disease)  This Provider for this visit: Treatment Team:  Attending Provider: Brand Males, MD    Follow-up dyspnea that is multifactorial due to obesity, physical deconditioning and diastolic dysfunction, ILD and new dx Group 3 PAH - dec 2021  45 pppd prior smoking hix  Follow-up  mild interstitial lung disease not otherwise specifieds -with stability 2018 through 2020 [non-IPF pattern/indeterminate   -on observation  -last CT Mach 2020 - > July 2021 without progression  Inconsisent dxx of  pulmonary emphysema present on high-resolution  CT chest - alpha 1 MM  - o2 with exertion  - advised spiriva oct 2020 but too expensive  Reported in only 1 CT. No mention in July 2021 CT  WHO group 3 Pulm htn with elevated PCWD - [facility] 12/07/20:  RA 13/12 mean 9 mmHg RV 48/8, EDP 12 PA 50/29 mean 38 PCWP 22/20 mean 20  O2 saturations: PA 62, Ao 97, SVC 64  CO 4.26 L/m, CI 1.96 Trans-pulmonic gradient 18 mmHg PVR 4.2 [place] Units  Grade 1 Disast dysfhn  - reportred July 2021 echo  Obesity   05/31/2021 -   Chief Complaint  Patient presents with  . Follow-up    Pt states she has been doing okay since last visit. States she has begun pulmonary rehab which has helped.     HPI Patty [name] 71 y.o. -returns for follow-up.  She continues on supportive care with oxygen.  Room air at rest 3-5 L pulsed with oxygen.  For the last few months she is on pulsed oxygen.  She uses 2 L nasal cannula at night with her CPAP.  She is completed pulmonary rehabilitation.  For the last few months she is using pulsed oxygen.  She feels this works better.  She is [facility] [employer] [facility].  She does elliptical or treadmill.  She feels her symptoms have improved.  She is continue to gain some weight though.  She again reported that she feels sensitive to medication and wants avoid antifibrotic's.  She was interested this time and discussing treprostinil versus pulsed research protocol for patients with interstitial lung disease and hypoxemic respiratory failure.  She has pulmonary hypertension based on 2021 December right heart catheterization.  We discussed the long-term safety profile of inhaled treprostinil.  Discussed the side effect profile.  After reflection about the fact the increase study showed improvement in 6-minute walk test, subgroup analysis potential modification of ILD and other composite outcome improvement.     SYMPTOM SCALE - ILD 03/12/2019  10/07/2019 30# wt gain  06/23/2020 250# 02/23/2021 255# 05/31/2021 259#  O2 use RA ra      Shortness of Breath 0 -> 5 scale with 5 being worst (score 6 If unable to do)      At rest 0 0 0 0   Simple tasks - showers, clothes change, eating, shaving 0 0  0   Household (dishes, doing bed, laundry) 0 0 0 0   Shopping 0 0 0 0 with card   Walking at own pace 0 4.5 4.5 4   Walking up Stairs 1-_0 Total (40 - 48) Dyspnea Score 2 9.5  9.5 9   How bad is your cough? 0 0 0 0   How bad is your fatigue 0 1 0 3   nausea   0 0   vomit   0 0   diarrhe   0 0   axniety   3 2   depression   0 1         Simple office walk 185 feet x  3 laps goal with forehead probe 03/12/2019  10/07/2019  06/23/2020  02/23/2021  05/31/2021   O2 used _0   Number laps completed 3 2nd lap 1 lap Walk from lobby to office   Comments about pace slow slow slow    Resting Pulse Ox/HR 100% and 85/min 96% and 72/min 97% and 77 91% room air at rest 98% and 74  Final Pulse Ox/HR 88% and 135/min 86% and 105/min 87% and 112/min 83%  86% in 1 lap and 111/mn  Desaturated </= 88% yes      Desaturated <= 3% points Yes, 12      Got Tachycardic >/= 90/min yes      Symptoms at end of test Mild dyspnea  Corrected with 5L [place]. Even with 4L [place] downt to  87% 2L [place] at rest - corrected to 90% Did 5L  to correct and do 3 laps  Miscellaneous comments x  ? worse           PFT  PFT Results Latest Ref Rng & Units 02/22/2021 06/09/2020 10/01/2019 03/12/2019 08/28/2018 02/05/2018 05/17/2017  FVC-Pre L 2.38 2.42 2.38 2.73 2.80 2.57 2.61  FVC-Predicted Pre % 81 82 81 91 94 83 84  FVC-Post L - - - - - - 2.54  FVC-Predicted Post % - - - - - - 81  Pre FEV1/FVC % % 63 63 69 67 70 67 70  Post FEV1/FCV % % - - - - - - 73  FEV1-Pre L 1.50 1.53 1.65 1.83 1.96 1.71 1.83  FEV1-Predicted Pre % 68 68 74 80 86 72 77  FEV1-Post L - - - - - - 1.84  DLCO uncorrected ml/min/mmHg 15.05 13.48 14.39 13.77 14.53 15.15 12.64  DLCO UNC% % 79 70 75 72 62 62 52  DLCO corrected ml/min/mmHg 27.44 12.89 - - - -  12.31  DLCO COR %Predicted % 144 67 - - - - 50  DLVA Predicted % 152 70 75 72 65 65 60  TLC L - - - - - - 5.09  TLC % Predicted % - - - - - - 100  RV % Predicted % - - - - - - 115    IMPRESSION: 1. Spectrum of findings suggestive of a mild basilar predominant fibrotic interstitial lung disease without appreciable interval progression since baseline 05/12/2017 high-resolution chest CT. Favor NSIP, with UIP not excluded. Findings are indeterminate for UIP per consensus guidelines: Diagnosis of Idiopathic Pulmonary Fibrosis: An Official ATS/ERS/JRS/ALAT Clinical Practice Guideline. [place], Iss 5, [phone], Aug 26 2017. 2. Dilated main pulmonary artery, stable, suggesting chronic pulmonary arterial hypertension. 3. Aortic Atherosclerosis (ICD10-I70.0).   Electronically Signed   By: Ilona Sorrel M.D.   On: 07/10/2020 15:50    has a past medical history of Abnormal nuclear stress test, Amyloidosis ([place]), Anxiety, Arthritis, Crohn disease ([place]), Crohn's colitis ([place]), DVT (deep venous thrombosis) ([place]), Dyspnea, Grade I diastolic dysfunction, Hyperlipidemia, ILD (interstitial lung disease) ([place]), OSA on CPAP, Pernicious anemia,  Polycythemia vera ([place]), Pulmonary embolism ([place]) (2010), and Sleep disorder (01/10/2020).   reports that she quit smoking about 12 years ago. Her smoking use included cigarettes. She has a 45.00 pack-year smoking history. She has never used smokeless tobacco.  Past Surgical History:  Procedure Laterality Date  .  RIGHT BREAST LUMPECTOMY WITH RADIOACTIVE SEED LOCALIZATION (Right Breast)  02/04/2021  . BIOPSY  09/30/2019   Procedure: BIOPSY;  Surgeon: Jerene Bears, MD;  Location: [facility];  Service: Gastroenterology;;  . BREAST LUMPECTOMY WITH RADIOACTIVE SEED LOCALIZATION Right 02/04/2021   Procedure: RIGHT BREAST LUMPECTOMY WITH RADIOACTIVE SEED LOCALIZATION;  Surgeon: Coralie Keens, MD;  Location: [facility];  Service: General;   Laterality: Right;  . CARDIAC CATHETERIZATION    . COLON RESECTION  1993  . COLONOSCOPY WITH PROPOFOL N/A 09/30/2019   Procedure: COLONOSCOPY WITH PROPOFOL;  Surgeon: Jerene Bears, MD;  Location: [facility];  Service: Gastroenterology;  Laterality: N/A;  . HIP ARTHROPLASTY Right    x 2, initial right THA, then subsequent right THA revision   . LEFT HEART CATH AND CORONARY ANGIOGRAPHY N/A 07/28/2017   Procedure: Left Heart Cath and Coronary Angiography;  Surgeon: Burnell Blanks, MD;  Location: [facility];  Service: Cardiovascular;  Laterality: N/A;  . POLYPECTOMY  09/30/2019   Procedure: POLYPECTOMY;  Surgeon: Jerene Bears, MD;  Location: [facility];  Service: Gastroenterology;;  . RIGHT HEART CATH N/A 12/07/2020   Procedure: RIGHT HEART CATH;  Surgeon: Sherren Mocha, MD;  Location: [facility];  Service: Cardiovascular;  Laterality: N/A;    Allergies  Allergen Reactions  . Victoza [Liraglutide] Shortness Of Breath  . Lasix [Furosemide] Itching    Hypersensitivity - pt can take as needed   . Metformin And Related Diarrhea    Increased moodiness  . Onion Other (See Comments)    sereve diarrhea, stomach pains (Crohn's flares)  . Potassium-Containing Compounds Other (See Comments)    Due to chron's, difficult passing though kidneys not allowing absorption in the body   . Prednisone Other (See Comments)    Altered mood  . Sulfa Drugs Cross Reactors Other (See Comments)    "feels like bugs crawling on me"    Immunization History  Administered Date(s) Administered  . Fluad Quad(high Dose 65+) 10/07/2019, 09/09/2020  . Influenza, High Dose Seasonal PF 08/28/2018  . Influenza-Unspecified 11/23/2017  . PFIZER Comirnaty(Gray Top)Covid-19 Tri-Sucrose Vaccine 05/14/2021  . PFIZER(Purple Top)SARS-COV-2 Vaccination 02/16/2020, 03/17/2020, 12/14/2020  . Pneumococcal Conjugate-13 02/13/2018  . Pneumococcal Polysaccharide-23 03/12/2019    Family History  Problem  Relation Age of Onset  . Glaucoma Mother   . High blood pressure Mother   . High Cholesterol Mother   . Sleep apnea Mother   . Diabetes Mellitus II Father   . Sleep apnea Father   . Anxiety disorder Father   . COPD Brother   . Heart disease Brother   . Non-Hodgkin's lymphoma Brother   . Diabetes Mellitus II Brother   . Glaucoma Brother   . Colon cancer Neg Hx   . Esophageal cancer Neg Hx   . Pancreatic cancer Neg Hx   . Stomach cancer Neg Hx   . Liver disease Neg Hx      Current Outpatient Medications:  .  amoxicillin (AMOXIL) 500 MG capsule, Take 2,000 mg by mouth See admin instructions. Before dental procedures, Disp: , Rfl:  .  aspirin EC 81 MG tablet, Take 81 mg by mouth daily., Disp: , Rfl:  .  b complex vitamins capsule, Take 1 capsule by mouth once a week., Disp: , Rfl:  .  Calcium Carbonate-Vit D-Min (CALCIUM 1200) 1200-1000 MG-UNIT CHEW, Chew 1 tablet by mouth daily., Disp: , Rfl:  .  CANNABIDIOL PO, Take 1 Dose by mouth 2 (two) times daily as needed (anxiety/sleep.). CBD Oil, Disp: , Rfl:  .  cetirizine (ZYRTEC) 10 MG tablet, Take 10 mg by mouth daily as needed (sinus/allergies.)., Disp: , Rfl:  .  cetirizine-pseudoephedrine (ZYRTEC-D) 5-120 MG tablet, Take 1 tablet by mouth daily as needed (sinus headaches.)., Disp: , Rfl:  .  clonazePAM (KLONOPIN) 0.5 MG tablet, TAKE 1/2 TO 1 TABLETS (0.25-0.5 MG TOTAL) BY MOUTH DAILY AS NEEDED (FOR ANXIETY OR SLEEP.)., Disp: 90 tablet, Rfl: 1 .  cyanocobalamin (,VITAMIN B-12,) 1000 MCG/ML injection, Inject 1 mL (1,000 mcg total) into the muscle every 30 (thirty) days. Inject 1 mL (1,000 mcg) into the muscle every 30 days, Disp: 12 mL, Rfl: 0 .  FLUoxetine (PROZAC) 20 MG capsule, Take 1 capsule (20 mg total) by mouth daily., Disp: 90 capsule, Rfl: 3 .  fluticasone (FLONASE) 50 MCG/ACT nasal spray, Place 1-2 sprays into both nostrils daily as needed for allergies or rhinitis., Disp: , Rfl:  .  Glycerin-Polysorbate 80 (REFRESH DRY EYE  THERAPY OP), Place 1 drop into both eyes daily., Disp: , Rfl:  .  ibuprofen (ADVIL) 200 MG tablet, Take 400-800 mg by mouth every 8 (eight) hours as needed (pain.)., Disp: , Rfl:  .  mesalamine (PENTASA) 250 MG CR capsule, Take 250-500 mg by mouth 4 (four) times daily as needed (crohn's disease flare ups)., Disp: , Rfl:  .  Multiple Vitamins-Minerals (ICAPS AREDS 2 PO), Take 1 tablet by mouth daily., Disp: , Rfl:  .  prednisoLONE acetate (PRED FORTE) 1 % ophthalmic suspension, Place 1 drop into both eyes 2 (two) times daily as needed (Uveitis)., Disp: , Rfl:  .  Probiotic Product (PROBIOTIC PO), Take 1 capsule by mouth 2 (two) times a week. As needed, Disp: , Rfl:  .  traMADol (ULTRAM) 50 MG tablet, Take 1 tablet (50 mg total) by mouth every 6 (six) hours as needed., Disp: 20 tablet, Rfl: 0 .  furosemide (LASIX) 20 MG tablet, Take 1 tablet (20 mg total) by mouth as needed for fluid., Disp: 90 tablet, Rfl: 3 No current facility-administered medications for this visit.  Facility-Administered Medications Ordered in Other Visits:  .  6 CHG cloth bath night before surgery, , , Once **AND** 6 CHG cloth bath AM of surgery, , , Once **AND** Chlorhexidine Gluconate Cloth 2 % PADS 6 each, 6 each, Topical, Once **AND** Chlorhexidine Gluconate Cloth 2 % PADS 6 each, 6 each, Topical, Once, Coralie Keens, MD      Objective:   Vitals:   05/31/21 1425  BP: 128/80  Pulse: 74  SpO2: 98%  Weight: 259 lb 12.8 oz (117.8 kg)  Height: _0  (1.626 m)    Estimated body mass index is 44.59 kg/m as calculated from the following:   Height as of this encounter: _1  (1.626 m).   Weight as of this encounter: 259 lb 12.8 oz (117.8 kg).  _2 @  Filed Weights   05/31/21 1425  Weight: 259 lb 12.8 oz (117.8 kg)     Physical Exam  General: No distress. Obse. On o2 Neuro: Alert and Oriented x 3. GCS 15. Speech normal Psych: Pleasant Resp:  Barrel Chest - no.  Wheeze - no, Crackles - faint  crackles at base,  No overt respiratory distress CVS: Normal heart sounds. Murmurs - no Ext: Stigmata of Connective Tissue Disease - no HEENT: Normal upper airway. PEERL +. No post nasal drip        Assessment:       ICD-10-CM   1. ILD (interstitial lung disease) ([place])  J84.9   2. WHO group 3 pulmonary arterial hypertension (HCC)  I27.23   3. OSA on CPAP  G47.33    Z99.89   4. Dyspnea on exertion  R06.00   5. Chronic hypoxemic respiratory failure (HCC)  J96.11        Plan:     Patient Instructions  Chronic hypoxemic respiratory failure due to all of the below ILD (interstitial lung disease) ([employer])   -  Decline in March 2020 through February 2022 on pulmonary function test but stable October 2020 through February 2022 on pulmonary function test -Stable on CT scan of the chest 2018 through July 2021 -Specific variety not known but given stability most likely this a non-- IPF varieties such as NSIP seen in women in your age group -Overall supportive care of plan without antifibrotic's given history of colitis [antifibrotic's can exacerbate this situation] and stability -Glad you completed pulmonary rehabilitation and doing exercise therapy at the rec center 3 times a week at least -You are using room air at rest with 3-5 L with exertion -pulse oxygen for the last few months  Plan --Continue oxygen 3-5 L nasal cannula pulsed with exertion and at night 2 L nasal cannula and room air at rest -Continue exercise program -Hold off clinical trials at this point based on shared decision making -Do spirometry and DLCO in 3 months  WHO group 3 pulmonary arterial hypertension ([place]) with elevaed PCWP to 20 on Dec 2021  -We discussed this in detail and respect the fact that you feel you are sensitive to medications.  We discussed the side effect profile and establish long-term safety of inhaled treprostinil.  We took a shared decision making to go with standard of care approach over clinical  trial   Plan  -Start inhaled treprostinil per pharmacy protocol [refer to pharmacy] -Refer to pharmacy for the above   OSA on CPAP  Stable  plan -Continue CPAP at ngith with 2L [place] at night  per the advice of his sleep doctor - at some point can check o2 level at night   Follow-up -3 months clinical follow-up in a 30-minute slot with Dr. Chase Caller but after breathing test  -  [symptom score and simple walking desaturation test at follow-up      SIGNATURE    Dr. Brand Males, M.D., F.C.C.P,  Pulmonary and Critical Care Medicine Staff Physician, [place] Director - Interstitial Lung Disease  Program  Pulmonary [facility] [place], [place], [address]  Pager: [phone], If no answer or between  15:00h - 7:00h: call [phone] Telephone: [phone]  3:19 PM 05/31/2021

## 2021-05-31 NOTE — Patient Instructions (Addendum)
Chronic hypoxemic respiratory failure due to all of the below ILD (interstitial lung disease) ([place])   -  Decline in March 2020 through February 2022 on pulmonary function test but stable October 2020 through February 2022 on pulmonary function test -Stable on CT scan of the chest 2018 through July 2021 -Specific variety not known but given stability most likely this a non-- IPF varieties such as NSIP seen in women in your age group -Overall supportive care of plan without antifibrotic's given history of colitis [antifibrotic's can exacerbate this situation] and stability -Glad you completed pulmonary rehabilitation and doing exercise therapy at the rec center 3 times a week at least -You are using room air at rest with 3-5 L with exertion -pulse oxygen for the last few months  Plan --Continue oxygen 3-5 L nasal cannula pulsed with exertion and at night 2 L nasal cannula and room air at rest -Continue exercise program -Hold off clinical trials at this point based on shared decision making -Do spirometry and DLCO in 3 months  WHO group 3 pulmonary arterial hypertension ([place]) with elevaed PCWP to 20 on Dec 2021  -We discussed this in detail and respect the fact that you feel you are sensitive to medications.  We discussed the side effect profile and establish long-term safety of inhaled treprostinil.  We took a shared decision making to go with standard of care approach over clinical trial   Plan  -Start inhaled treprostinil per pharmacy protocol [refer to pharmacy] -Refer to pharmacy for the above   OSA on CPAP  Stable  plan -Continue CPAP at ngith with 2L [place] at night  per the advice of his sleep doctor - at some point can check o2 level at night   Follow-up -3 months clinical follow-up in a 30-minute slot with Dr. Chase Caller but after breathing test  -  [symptom score and simple walking desaturation test at follow-up

## 2021-06-03 ENCOUNTER — Other Ambulatory Visit: Payer: Self-pay

## 2021-06-04 ENCOUNTER — Other Ambulatory Visit: Payer: Self-pay | Admitting: Internal Medicine

## 2021-06-04 NOTE — Telephone Encounter (Signed)
I am confused after my visit on 05/31/2021. I had asked Dr. a question regarding CPAP/O2 and his response was to discuss it with my sleep doctor. Dr. Chase Caller and [name] are the ones who ordered my sleep studies and CPAP along with supplies approvals so I thought they were my "sleep doctors".  Do I now need to find and see a sleep doctor? Should I have been doing so these past 3 years?  Why would you not have the information as your office arranged everything? Should I have been seeing this sleep Dr on a regular basis or as needed? Please refer me to a reputable sleep doctor prefer at or near the [facility] location.  Dr. Chase Caller, please advise. You did order the HST back in 2018. Does the patient need to get established with a sleep doctor with the practice? Thanks!

## 2021-06-04 NOTE — Telephone Encounter (Signed)
Reviewed recods - I saw patient for first time in May 2018. [name] Parret orderd home sleep study in June 2018. Then in Oct 2018 I orderd a cPAP titration study which was ready by Dr Halford Chessman.  Plan  - agree needs board certified sleep specialist to manager sleep disorder - refer to some one in our office or Dr Fransico Him of cardiology

## 2021-06-04 NOTE — Progress Notes (Signed)
HPI  Patient presents today to [facility] for initial visit with pharmacy team for Tyvaso counseling. Pertinent past medical history includes WHO Group 3 PAH, ILD, OSA on CPAP, Crohn's disease, history of polycythemia vera, history of DVT with PE, dyslipidemia, and chronic allergic rhinitis. At her last visit with Dr. Chase Caller on 05/31/21, they discussed pursuing Tyvaso vs. clinical trials and a shared decision was made to pursue Tyvaso. Side effects were briefly discussed as well as goals of care at that visit.  She is curious about side effects d/t her low threshold for medication intolerability as well as any potential drug-drug interactions. She states she hasn't tolerated many medications in the past specifically due to GI upset. She states her Crohn's disease is currently well controlled on Pentasa.  Anticoagulant use: No current anticoagulant use, but has history of DVT with PE. Takes daily low-dose aspirin.  OBJECTIVE Allergies  Allergen Reactions   Victoza [Liraglutide] Shortness Of Breath   Lasix [Furosemide] Itching    Hypersensitivity - pt can take as needed    Metformin And Related Diarrhea    Increased moodiness   Onion Other (See Comments)    sereve diarrhea, stomach pains (Crohn's flares)   Potassium-Containing Compounds Other (See Comments)    Due to chron's, difficult passing though kidneys not allowing absorption in the body    Prednisone Other (See Comments)    Altered mood   Sulfa Drugs Cross Reactors Other (See Comments)    "feels like bugs crawling on me"    Outpatient Encounter Medications as of 06/07/2021  Medication Sig   amoxicillin (AMOXIL) 500 MG capsule Take 2,000 mg by mouth See admin instructions. Before dental procedures   aspirin EC 81 MG tablet Take 81 mg by mouth daily.   b complex vitamins capsule Take 1 capsule by mouth once a week.   Calcium Carbonate-Vit D-Min (CALCIUM 1200) 1200-1000 MG-UNIT CHEW Chew 1 tablet by mouth daily.    CANNABIDIOL PO Take 1 Dose by mouth 2 (two) times daily as needed (anxiety/sleep.). CBD Oil   cetirizine (ZYRTEC) 10 MG tablet Take 10 mg by mouth daily as needed (sinus/allergies.).   cetirizine-pseudoephedrine (ZYRTEC-D) 5-120 MG tablet Take 1 tablet by mouth daily as needed (sinus headaches.).   clonazePAM (KLONOPIN) 0.5 MG tablet TAKE 1/2 TO 1 TABLETS (0.25-0.5 MG TOTAL) BY MOUTH DAILY AS NEEDED (FOR ANXIETY OR SLEEP.).   cyanocobalamin (,VITAMIN B-12,) 1000 MCG/ML injection Inject 1 mL (1,000 mcg total) into the muscle every 30 (thirty) days. Inject 1 mL (1,000 mcg) into the muscle every 30 days   FLUoxetine (PROZAC) 20 MG capsule Take 1 capsule (20 mg total) by mouth daily.   fluticasone (FLONASE) 50 MCG/ACT nasal spray Place 1-2 sprays into both nostrils daily as needed for allergies or rhinitis.   furosemide (LASIX) 20 MG tablet Take 1 tablet (20 mg total) by mouth as needed for fluid.   Glycerin-Polysorbate 80 (REFRESH DRY EYE THERAPY OP) Place 1 drop into both eyes daily.   ibuprofen (ADVIL) 200 MG tablet Take 400-800 mg by mouth every 8 (eight) hours as needed (pain.).   mesalamine (PENTASA) 250 MG CR capsule Take 250-500 mg by mouth 4 (four) times daily as needed (crohn's disease flare ups).   Multiple Vitamins-Minerals (ICAPS AREDS 2 PO) Take 1 tablet by mouth daily.   prednisoLONE acetate (PRED FORTE) 1 % ophthalmic suspension Place 1 drop into both eyes 2 (two) times daily as needed (Uveitis).   Probiotic Product (PROBIOTIC PO) Take 1 capsule  by mouth 2 (two) times a week. As needed   traMADol (ULTRAM) 50 MG tablet Take 1 tablet (50 mg total) by mouth every 6 (six) hours as needed.   Facility-Administered Encounter Medications as of 06/07/2021  Medication   Chlorhexidine Gluconate Cloth 2 % PADS 6 each   And   Chlorhexidine Gluconate Cloth 2 % PADS 6 each     Immunization History  Administered Date(s) Administered   Fluad Quad(high Dose 65+) 10/07/2019, 09/09/2020   Influenza,  High Dose Seasonal PF 08/28/2018   Influenza-Unspecified 11/23/2017   PFIZER Comirnaty(Gray Top)Covid-19 Tri-Sucrose Vaccine 05/14/2021   PFIZER(Purple Top)SARS-COV-2 Vaccination 02/16/2020, 03/17/2020, 12/14/2020   Pneumococcal Conjugate-13 02/13/2018   Pneumococcal Polysaccharide-23 03/12/2019     HRCT  PFT's TLC  Date Value Ref Range Status  05/17/2017 5.09 L Final     CMP     Component Value Date/Time   NA 137 02/05/2021 0015   NA 139 12/04/2020 1456   K 4.0 02/05/2021 0015   CL 104 02/05/2021 0015   CO2 23 02/05/2021 0015   GLUCOSE 147 (H) 02/05/2021 0015   BUN 18 02/05/2021 0015   BUN 23 12/04/2020 1456   CREATININE 0.75 02/05/2021 0015   CREATININE 0.81 09/09/2020 1557   CALCIUM 8.9 02/05/2021 0015   PROT 7.3 02/01/2021 1431   PROT 7.0 01/16/2020 1714   ALBUMIN 4.1 02/01/2021 1431   ALBUMIN 4.3 01/16/2020 1714   AST 16 02/01/2021 1431   ALT 16 02/01/2021 1431   ALKPHOS 64 02/01/2021 1431   BILITOT 0.6 02/01/2021 1431   BILITOT 0.2 01/16/2020 1714   GFRNONAA >60 02/05/2021 0015   GFRNONAA 74 09/09/2020 1557   GFRAA 74 12/04/2020 1456   GFRAA 85 09/09/2020 1557     CBC    Component Value Date/Time   WBC 9.5 02/05/2021 0015   RBC 4.45 02/05/2021 0015   HGB 12.9 02/05/2021 0015   HGB 15.1 12/04/2020 1456   HCT 40.8 02/05/2021 0015   HCT 45.5 12/04/2020 1456   PLT 283 02/05/2021 0015   PLT 316 12/04/2020 1456   MCV 91.7 02/05/2021 0015   MCV 90 12/04/2020 1456   MCH 29.0 02/05/2021 0015   MCHC 31.6 02/05/2021 0015   RDW 14.4 02/05/2021 0015   RDW 14.0 12/04/2020 1456   LYMPHSABS 1.6 02/01/2021 1431   LYMPHSABS 2.0 12/04/2020 1456   MONOABS 0.5 02/01/2021 1431   EOSABS 0.1 02/01/2021 1431   EOSABS 0.2 12/04/2020 1456   BASOSABS 0.0 02/01/2021 1431   BASOSABS 0.1 12/04/2020 1456     LFT's Hepatic Function Latest Ref Rng & Units 02/01/2021 09/09/2020 06/23/2020  Total Protein 6.0 - 8.3 g/dL 7.3 6.8 7.4  Albumin 3.5 - 5.2 g/dL 4.1 - 4.2  AST 0 - 37  U/L _0 ALT 0 - 35 U/L _1 Alk Phosphatase 39 - 117 U/L 64 - 67  Total Bilirubin 0.2 - 1.2 mg/dL 0.6 0.4 0.4     TB GOLD    Hepatitis Panel Hepatitis Latest Ref Rng & Units 04/29/2020  Hep C Ab NON-REACTI NON-REACTIVE  Hep C Ab NON-REACTI NON-REACTIVE     ASSESSMENT  Tyvaso Medication Management Patient counseled on purpose, proper use, and common side effects of Tyvaso including cough, headache, nausea, dizziness, flushing, throat irritation and pharyngolaryngeal pain.  Reviewed less common but serious side effects including low blood pressure and increased bleeding risk.  Goals of therapy: functional quality of life as measured by improved 6MWT, reduced strain on  heart (based on NT-proBNP), and reduced risk of PH-ILD progression  Dose for PH-ILD: 18 mcg (3 inhalations) 4 times per day administered every 4 hours while patient is awake; if 3 inhalations are not tolerated, reduce to 1 to 2 inhalations, then increase to 3 inhalations as tolerated. Goal is to increase each dose by 3 inhalations at ~1- to 2-week intervals as tolerated. Discussed that dose is increased by tolerability.  Target dose is 72 mcg (12 inhalations) 4 times per day.   Adverse Effects: Common: cough, headache, nausea, dizziness, flushing, throat irritation and pharyngolaryngeal pain. Reviewed frequency of adverse effects; specifically nausea in 19% of patients vs 11% in placebo. Headache in 41% of Tyvaso pts vs. 23% in placebo. Cough in half of all Tyvaso patients vs 29% in placebo.  Less frequent but serious: low blood pressure and increased bleeding risk . She states she does have low blood pressure sometimes - does not take any medications.   Access: Discussed coverage through medical benefit. Discussed specialty pharmacy nurse who makes home visit for first dose of Tyvaso. Discussed possibility of moving to DPI option in future once stable on therapy pending availability and renewed benefits  investigation through pharmacy benefit.  Medication Reconciliation  A drug regimen assessment was performed, including review of allergies, interactions, disease-state management, dosing and immunization history. Medications were reviewed with the patient, including name, instructions, indication, goals of therapy, potential side effects, importance of adherence, and safe use.  Drug interaction(s): none noted, does take daily aspirin and furosemide prn.  Anticoagulant use: No  Immunizations  She has received 4 COVID19 vaccines UTD on influenza, pneumonia vaccinations. Has not yet received zoster vaccine.  PLAN - Complete Tyvaso referral form for [facility]: Phone: [phone]      Fax: [phone] and [employer] patient assistance paperwork. Advised her that [facility] would be reaching out to her through this process as they need information from her - Provider portion of paperwork placed in Dr. Golden Pop mailbox to be completed.  All questions encouraged and answered.  Instructed patient to call with any further questions or concerns.  Thank you for allowing pharmacy to participate in this patient's care.  This appointment required 60 minutes of patient care (this includes precharting, chart review, review of results, face-to-face care, etc.).    Knox Saliva, PharmD, MPH Clinical Pharmacist (Rheumatology and Pulmonology)

## 2021-06-07 ENCOUNTER — Other Ambulatory Visit: Payer: Self-pay

## 2021-06-07 ENCOUNTER — Ambulatory Visit: Payer: Medicare Other | Admitting: Pharmacist

## 2021-06-07 ENCOUNTER — Telehealth: Payer: Self-pay | Admitting: Pharmacist

## 2021-06-07 DIAGNOSIS — J849 Interstitial pulmonary disease, unspecified: Secondary | ICD-10-CM

## 2021-06-07 DIAGNOSIS — Z79899 Other long term (current) drug therapy: Secondary | ICD-10-CM

## 2021-06-07 DIAGNOSIS — I2723 Pulmonary hypertension due to lung diseases and hypoxia: Secondary | ICD-10-CM

## 2021-06-07 NOTE — Telephone Encounter (Signed)
Dr. Chase Caller please advise on the following My Chart message:   Thank you for your information. It is appreciated. I did have one other question which I forgot to bring up at the appointment. Pulmonary therapy had also ask but did not receive a response. I had been at a maximum heart rate of 145 prior to pulmonary hypertension. PR kept me at 120-125 HR during exercise. Is this the recommended maximum HR for someone with ILD and PH? Just want to be safe and do all possible to properly manage these issues.  Thank you

## 2021-06-07 NOTE — Telephone Encounter (Signed)
Tyvaso referral forms completed today - signed by patient and Dr. Chase Caller.  Patient and Dr. Chase Caller also signed [employer] patient assistance consent form.  Patient has Medicare A/B and AARP supplemental so may not have to utilize patient assistance pending BIV.  Knox Saliva, PharmD, MPH Clinical Pharmacist (Rheumatology and Pulmonology)

## 2021-06-09 NOTE — Telephone Encounter (Signed)
HR 120s is safe area to exercise. Is she saying is too low and wants higher level? Or is sh worried is too high and could be unsafe?

## 2021-06-11 NOTE — Telephone Encounter (Addendum)
Submitted referral paperwork to [facility] for TYVASO along with HRCT, right heart cath results, and most recent progress note.  Fax# [phone] Phone# [phone]  [employer] form signed by patient is on file with application to be submitted once we receive update from [facility].  I've also notified Butch Penny, our [facility] rep, to advise that referral is incoming  Knox Saliva, PharmD, MPH Clinical Pharmacist (Rheumatology and Pulmonology)

## 2021-07-02 NOTE — Telephone Encounter (Signed)
Patient approved for Tyvaso through medical benefit. First shipment of Tyvaso was received by patient on 06/24/21. [facility] nurse visit scheduled for 07/01/21, 07/08/21, 08/26/21, 11/04/21 per [facility] Portal. Patient will need f/u with Dr. Chase Caller in approximately 6-8 weeks after starting.  Routing to scheduling team to assist.  Knox Saliva, PharmD, MPH Clinical Pharmacist (Rheumatology and Pulmonology)

## 2021-07-14 NOTE — Telephone Encounter (Signed)
Patient had first nursing visit and started Tyvaso on 07/01/21. She has second Tyvaso nursing visit on 07/09/21  F/u with Dr Chase Caller and PFT scheduled for 09/07/21.  Knox Saliva, PharmD, MPH, BCPS Clinical Pharmacist (Rheumatology and Pulmonology)

## 2021-07-15 ENCOUNTER — Other Ambulatory Visit: Payer: Self-pay | Admitting: Family Medicine

## 2021-08-02 ENCOUNTER — Encounter: Payer: Self-pay | Admitting: Family Medicine

## 2021-08-02 ENCOUNTER — Ambulatory Visit: Payer: Medicare Other | Admitting: Family Medicine

## 2021-08-02 ENCOUNTER — Other Ambulatory Visit: Payer: Self-pay

## 2021-08-02 MED ORDER — HYDROCORTISONE (PERIANAL) 2.5 % EX CREA
1.0000 | TOPICAL_CREAM | Freq: Two times a day (BID) | CUTANEOUS | 2 refills | Status: AC | PRN
Start: 2021-08-02 — End: ?

## 2021-09-07 ENCOUNTER — Ambulatory Visit (INDEPENDENT_AMBULATORY_CARE_PROVIDER_SITE_OTHER): Payer: Medicare Other | Admitting: Internal Medicine

## 2021-09-07 ENCOUNTER — Other Ambulatory Visit: Payer: Self-pay

## 2021-09-07 ENCOUNTER — Encounter: Payer: Self-pay | Admitting: Internal Medicine

## 2021-09-07 VITALS — BP 126/74 | HR 72 | Temp 98.1°F | Ht 63.25 in | Wt 266.4 lb

## 2021-09-07 DIAGNOSIS — J849 Interstitial pulmonary disease, unspecified: Secondary | ICD-10-CM | POA: Diagnosis not present

## 2021-09-07 DIAGNOSIS — I2723 Pulmonary hypertension due to lung diseases and hypoxia: Secondary | ICD-10-CM

## 2021-09-07 DIAGNOSIS — G4733 Obstructive sleep apnea (adult) (pediatric): Secondary | ICD-10-CM

## 2021-09-07 DIAGNOSIS — R0609 Other forms of dyspnea: Secondary | ICD-10-CM

## 2021-09-07 DIAGNOSIS — J9611 Chronic respiratory failure with hypoxia: Secondary | ICD-10-CM | POA: Diagnosis not present

## 2021-09-07 DIAGNOSIS — R0600 Dyspnea, unspecified: Secondary | ICD-10-CM

## 2021-09-07 DIAGNOSIS — Z9989 Dependence on other enabling machines and devices: Secondary | ICD-10-CM

## 2021-09-07 LAB — PULMONARY FUNCTION TEST
DL/VA % pred: 70 %
DL/VA: 2.94 ml/min/mmHg/L
DLCO cor % pred: 63 %
DLCO cor: 11.94 ml/min/mmHg
DLCO unc % pred: 63 %
DLCO unc: 11.94 ml/min/mmHg
FEF 25-75 Pre: 0.78 L/sec
FEF2575-%Pred-Pre: 43 %
FEV1-%Pred-Pre: 65 %
FEV1-Pre: 1.41 L
FEV1FVC-%Pred-Pre: 81 %
FEV6-%Pred-Pre: 82 %
FEV6-Pre: 2.25 L
FEV6FVC-%Pred-Pre: 103 %
FVC-%Pred-Pre: 79 %
FVC-Pre: 2.28 L
Pre FEV1/FVC ratio: 62 %
Pre FEV6/FVC Ratio: 99 %

## 2021-09-07 NOTE — Patient Instructions (Addendum)
Chronic hypoxemic respiratory failure due to all of the below ILD (interstitial lung disease) ([place]) WHO group 3 pulmonary arterial hypertension ([place]) with elevaed PCWP to 20 on Dec 2021  Pulmonary function test shows continued decline.  I think this is most likely due to progressive pulmonary fibrosis.  Alternatively it could be the Tyvaso that he started in July 2022 is contributing to the decline.  Noted that you r now needing 6 L of nasal cannula with exertion,  2L nasal cannula at night and room air at rest  Plan -Stop Tyvaso  and we will monitor to see if stopping Tyvaso stabilizes lung function --Continue oxygen 3-6 L nasal cannula pulsed with exertion and at night 2 L nasal cannula and room air at rest -Continue exercise program  -However continue to keep her pulse ox at greater than or equal to 88% using a pulse oximeter -Take ILD-PRO registry consent -Do spirometry and DLCO in 3 months -to see continued progression versus stabilization of Tyvaso -We can evaluate you for some IV intervention phase 3 clinical trials if available and you are eligible  - Hold off on antifibrotic's given the fact you have had colectomy given the fact you have had  GI side effects -Make attempts to lose weight   OSA on CPAP  Stable  plan -Continue CPAP at ngith with 2L [place] at night  per the advice of his sleep doctor - at some point can check o2 level at night   Follow-up -6 weeks video virtual visit with Dr. Chase Caller -3 months clinical follow-up in a 30-minute slot with Dr. Chase Caller but after breathing test  -  [symptom score and simple walking desaturation test at follow-up in 3 months

## 2021-09-07 NOTE — Progress Notes (Signed)
_0  ID: Patty [name], female    DOB: [birth year], 70 y.o.   MRN: [id number]  Chief Complaint  Patient presents with   Follow-up    dyspnea     Referring provider: No ref. provider found  HPI: 70 year old female former smoker seen for pulmonary consulokay guarded because then maybe they can meet him right now for the registryt 05/09/2017 for progressive dyspnea since 2004.[name] Patient has polycythemia vera followed by hematology and [place] She has Crohn's disease    IOV  05/09/2017  Chief Complaint  Patient presents with   Pulmonary Consult    Pt referred for SOB with activity x years. Pt states over the last year the DOE has worsened. Pt denies cough and CP/tightness and f/c/s.     70 year old obese female. In 2004 Started noticing insidious onset of shortness of breath. In 2005 more [place] from [place] and probably gain over 60 pounds of weight. She has extensive workup at that time according to her history. She was then diagnosed with polycythemia by Dr. [name] at [facility]. She does not recollect any phlebotomies for this. However in the last 4-5 years she's had worsening shortness of breath. Noticeable with exertion particularly in the gym and doing standing exercises but not so much with sitting exercises. Relieved by rest. When she does some yard work she notices some associated wheezing as well.  Walking desaturation test in the office she did desaturate to 88% and did get tachycardic. She says that a chest x-ray done by primary care physicians interstitial pulmonary fibrosis. I personally visualized the chest x-ray done 04/03/2017: There is some interstitial markings but it is obscured by her obesity. I'm not so certain that is definite ILD. There is no lab work in the record. There is no PFT a CT scan available for visualization     05/29/2017 Follow up : Dyspnea  Patient returns for a two-week follow-up. Patient was seen for pulmonary consult  05/09/2017 for progressive dyspnea since 2004. Patient was set up for a high resolution CT chest that showed a very mild basilar predominant subpleural reticulation and bronchiectasis which could be due to nonspecific interstitial pneumonitis.. CT did have incidental findings of enlarged pulmonary arteries and aortic and coronary calcification. Patient had pulmonary function test on May 23 that showed an FEV1 at 77%, ratio 73, FVC 81, no significant bronchodilator response, total lung capacity 100%, DLCO 52%. Overnight oximetry test did show significant desaturations. We discussed beginning oxygen at bedtime.  Patient denies significant cough. Says that she get short of breath with walking. Has daytime fatigue and low energy..  Interstitial lung disease patient questionnaire was completed as follows Patient has no previous use of Macrodantin, amiodarone, methotrexate. She has been treated with prednisone for her Crohn's in the past. Patient has had extensive travel with brief visits to multiple state and country's.. [place]. Did live in a apartment that she did notice mold for 4 years. She does have Crohn's disease and was on Mesamaline for many years. Patient says she has had a humidifier and hot tub and previous house. She has no indoor birds. She does have a dog. Patient says she was told that she had a blood clot in the past but was never found.   OV 08/15/2017  Chief Complaint  Patient presents with   Follow-up    Pt still gets occ. SOB. Other than that, pt states that she has been doing good.  Denies any cough or CP.     Follow-up multifactorial dyspnea with interstitial lung disease concern.   Regarding interstitial lung disease concern: Mid May 2018 she had high resolution CT chest that showed interstitial lung disease very mild but unclear if possible UIP pattern. Autoimmune test was negative. Isolated reduction in diffusion capacity 50%. [employer] of chest  physicians questionnaire shows previous Crohn's disease and also mold exposure previously. Overall she feels stable  Regarding dyspnea: She underwent echocardiogram in June 2018 showed grade 1 diastolic dysfunction. Had abnormal cardiac stress test July 2018 followed by cardiac cath early August 2018 that showed normal coronary angiogram but elevated left ventricular end-diastolic pressure. Dietary therapy has been recommended but she is unaware of these results.  Overall she is here to discuss these results.  IMPRESSION: 1. Suspect very mild basilar predominant subpleural reticulation and bronchiolectasis, which may be due to nonspecific interstitial pneumonitis. 2. Aortic atherosclerosis (ICD10-170.0). Coronary artery calcification. 3. Enlarged pulmonary arteries, indicative of pulmonary arterial hypertension.     Electronically Signed   By: Lorin Picket M.D.   On: 05/13/2017 07:33    OV 10/17/2017  Chief Complaint  Patient presents with   Follow-up    Pt states that she has had good days and bad days since last visit. States that when she was in [place] for a month, breathing was better; still became SOB but not as bad as in [place]. Pt's SOB is mainly on exertion. Denies any cough or CP.    Follow-up dyspnea that is multifactorial due to obesity, physical deconditioning and diastolic dysfunction Follow-up mild interstitial lung disease not otherwise specified  Last visit I started her on Lasix. I was only supposed to see herin 6 months or so. However she's had problems tolerating Lasix and potassium. It fluctuates between palpitations and hypertension. She feels the palpitations might be related to potassium depletion after taking Lasix. But she also tells me that the Lasix does help her dyspnea. At this point in time her condition is to see cardiologist. Off note she did send the email message asking these questions on 09/28/2017 made a reply that she tells me that she never got  the reply.   OV 02/13/2018  Chief Complaint  Patient presents with   Follow-up    PFT done 02/05/18.  Pt states she has been doing good. Was sick in january but states she is doing better.  DME: [facility] 4L pulse    Follow-up dyspnea that is multifactorial due to obesity, physical deconditioning and diastolic dysfunction Follow-up mild interstitial lung disease not otherwise specified v concern   70 year old female with concern for interstitial lung disease.  Since her last visit she continues to do well.  She is attending pulmonary rehabilitation.  She feels better and less short of breath.  She uses oxygen with exertion at rehab saying she needs it.  She has upcoming travel in summer to [place] and wanted some flight information oxygen form filled out.  She is wondering if mesalamine that she took several years ago was the cause of interstitial lung disease or her Crohn's disease.  But overall she is stable.  Pulmonary function test shows stability and FVC since last 1 year with some improvement in DLCO.  High-resolution CT scan of the chest interpreted by thoracic radiology report suspected ILD.   OV 08/28/2018  Chief Complaint  Patient presents with   Follow-up    PFT performed today.  Pt states she has had both good and bad  days since last visit. Pt denies any complaints of cough, SOB, or CP but states she is having some problems with congestion since she has been back from vacation. Pt has been having problems with getting O2 supplies taken care of with DME.    Follow-up dyspnea that is multifactorial due to obesity, physical deconditioning and diastolic dysfunction Follow-up concern for  mild interstitial lung disease not otherwise specified  Patty [name] - Presents for follow-up for the above issues. Her dyspnea significantly better following a 30 pound weight loss and continued rehabilitation. She uses oxygen with rehabilitation. Today walking desaturation test 185 feet 3 laps on room  air: She dropped to 87% on the second lap. But she is feeling better. She had spirometry today that shows improvement in FVC concomitant with weight loss but no change in diffusion capacity that reflects the presence of ongoing possible ILD.She continues to lose more weight she believes that she could lose another 30 pounds before the next visit.     OV 03/12/2019  Subjective:  Patient ID: Patty [name], female , DOB: [birth year] , age 21 y.o. , MRN: [id number] , ADDRESS: [place] [id number]   03/12/2019 -   Chief Complaint  Patient presents with   Follow-up    PFT performed today.  Pt states she has been doing good since last visit. States she still becomes SOB with exertion, and has phlegm in the mornings, postnasal drainage which has used flonase to help. Pt also uses POC as needed.    Follow-up dyspnea that is multifactorial due to obesity, physical deconditioning and diastolic dysfunction Follow-up  mild interstitial lung disease not otherwise specifieds -with stability 2018 through 2020 [non-IPF pattern]; on observation   HPI Jacquese Cassarino 70 y.o. -presents for follow-up.  She is attending pulmonary rehabilitation and using oxygen with exertion.  Overall she feels stable.  Subjective symptom parameters and objective parameters are documented below.  She had high-resolution CT chest that showed shows that she has ILD.  At this point we can be confident that she has mild ILD but it is stable based on symptoms walking test and pulmonary function test.  The previous autoimmune test was negative.      OV 10/07/2019  Subjective:  Patient ID: Patty [name], female , DOB: June 19, 1951 , age 77 y.o. , MRN: [id number] , ADDRESS: [place] [id number]   10/07/2019 -   Chief Complaint  Patient presents with   Follow-up     Follow-up dyspnea that is multifactorial due to obesity, physical deconditioning and diastolic dysfunction Follow-up  mild  interstitial lung disease not otherwise specifieds -with stability 2018 through 2020 [non-IPF pattern]; on observation Follow-up mild pulmonary emphysema present on high-resolution CT chest  HPI Rihana Kiddy 70 y.o. -presents for follow-up.  Prior visit was pre-pandemic.  Since then she has been sedentary at home.  She tells me that she is more short of breath.  The symptom scores below reflect that.  She thinks is because of the 30 pound weight gain because of sedentary living and social isolation following the onset of the pandemic.  She is frustrated by this.  Deep down she thinks her ILD is stable.  Pulmonary function test shows a drop in FVC but stability and DLCO suggesting weight gain is an ongoing issue for worsening dyspnea and pulmonary function test.  She is willing to have a high-dose flu shot today.  She also tells me that she is not taking her  Spiriva because it was expensive.  Although it did help her she is willing to try again and try to price it.  She also stated that her mom who never smoked had emphysema but her dad smoked heavily.  She has never been tested for alpha-1.   [place] -dropped. But tries not to use it in public    IMPRESSION: 1. Very mild scattered basilar subpleural reticulation and ground-glass similar to 01/29/2018. Nonspecific interstitial pneumonitis can not be excluded. Findings are indeterminate for UIP per consensus guidelines: Diagnosis of Idiopathic Pulmonary Fibrosis: An Official ATS/ERS/JRS/ALAT Clinical Practice Guideline. [place], Iss 5, [id number], Aug 26 2017. 2. Question cirrhosis. 3. Aortic atherosclerosis (ICD10-170.0). Coronary artery calcification. 4. Enlarged arteries, arterial hypertension. 5.  Emphysema (ICD10-J43.9).     Electronically Signed   By: Lorin Picket M.D.   On: 03/06/2019 16:59   ROS - per HPI     OV 06/23/2020  Subjective:  Patient ID: Patty [name], female ,  DOB: February 22, 1951 , age 30 y.o. , MRN: [id number] , ADDRESS: [address] [place] [address]   06/23/2020 -   Chief Complaint  Patient presents with   Follow-up    shortness of breath with exertion   Follow-up dyspnea that is multifactorial due to obesity, physical deconditioning and diastolic dysfunction  Follow-up  mild interstitial lung disease not otherwise specifieds -with stability 2018 through 2020 [non-IPF pattern/indeterminate   -; on observation  - last CT Mach 2020 0> July 2021 wthout progression  Follow-up mild pulmonary emphysema present on high-resolution CT chest - alpha 1 MM  - o2 with exertion  - advised spiriva oct 2020 but too expensive  HPI Kanita Delage 70 y.o. -returns for routine follow-up.  She says she has been doing well.  She uses portable oxygen cranks it up to 4 L - 5 L and then walks every day for 30 minutes.  Overall she feels stable.  Her symptom score itself is stable.  However on pulmonary function testing her FVC shows a decline.  This decline started in October 2020 and since then it is stable.  She attributes this to weight gain.  There is what she told me last time as well.  However when we walked her she seemed to desaturate much more easily.  We also needed to corrected desaturation at 5 L.  She thinks it is because the portable oxygen system is not delivering oxygen currently and is a technical issue with a portable machine.  However the distance of desaturation seems to worsen.  There is no leg swelling although she has some mild varicose veins at baseline.  She is not on nighttime oxygen and she recollects attest that this some years ago and was normal back then.  Currently she is only using portable oxygen.  Her last echocardiogram was few years ago.  There is no hemoptysis or worsening cough.  She got a Covid vaccine in February 2021 and she thinks things might of changed since then.   Results for KRISTEENA, MEINEKE ANN (MRN [id number]) as of  06/23/2020 11:49  Ref. Range 05/17/2017 13:11 02/05/2018 12:14 08/28/2018 11:22 03/12/2019 14:47 10/01/2019 15:58 30 pound weight gain 06/09/2020 11:25  FVC-Pre Latest Units: L 2.61 2.57 2.80 2.73 2.38 2.42  FVC-%Pred-Pre Latest Units: % 84 83 94 91 81 82  FEV1-Pre Latest Units: L 1.83 1.71 1.96 1.83 1.65 1.53   Results for TYREISHA, UNGAR ANN (MRN [id number]) as of 06/23/2020 11:49  Ref. Range 05/17/2017 13:11 02/05/2018 12:14 08/28/2018 11:22 03/12/2019 14:47 10/01/2019 15:58 06/09/2020 11:25  DLCO unc Latest Units: ml/min/mmHg 12.64 15.15 14.53 13.77 14.39 13.48  DLCO unc % pred Latest Units: % 52 62 62 72 75 70    ROS - per HPI     OV 02/23/2021  Subjective:  Patient ID: Patty [name], female , DOB: [birth year] , age 41 y.o. , MRN: [id number] , ADDRESS: [place] [address] PCP [name], MD Patient Care Team: [name], MD as PCP - General (Family Medicine) Pyrtle, Lajuan Lines, MD as Consulting Physician (Gastroenterology) Shamleffer, Melanie Crazier, MD as Consulting Physician (Endocrinology) Brand Males, MD as Consulting Physician (Pulmonary Disease)  This Provider for this visit: Treatment Team:  Attending Provider: Brand Males, MD   Follow-up dyspnea that is multifactorial due to obesity, physical deconditioning and diastolic dysfunction, ILD and new dx Group 3 PAH - dec 2021  45 pppd prior smoking hix  Follow-up  mild interstitial lung disease not otherwise specifieds -with stability 2018 through 2020 [non-IPF pattern/indeterminate   -; on observation  - last CT Mach 2020 - > July 2021 without progression  Inconsisent dxx of  pulmonary emphysema present on high-resolution CT chest - alpha 1 MM  - o2 with exertion  - advised spiriva oct 2020 but too expensive  Reported in only 1 CT. No mention in July 2021 CT  WHO group 3 Pulm htn with elevated PCWD - [facility] 12/07/20:   RA 13/12 mean 9 mmHg RV 48/8, EDP 12 PA 50/29 mean 38 PCWP 22/20  mean 20   O2 saturations: PA 62, Ao 97, SVC 64   CO 4.26 L/m, CI 1.96 Trans-pulmonic gradient 18 mmHg PVR 4.2 Woods Units  Grade 1 Disast dysfhn  - reportred July 2021 echo  Obesity  02/23/2021 -   Chief Complaint  Patient presents with   Follow-up    Doing ok     HPI Chaka Boyson 70 y.o. -returns for 57-monthfollow-up.  She has inconsistent diagnosis of emphysema.  Was reported in 1 scan but not another scans.  She has ILD.  She is foregoing biopsy.  She tells me overall that she is stable.  Although there are some weight gain.  She feels in the interim Metformin messed up her GI system and cause weight gain but she is glad to be out of it.  She had 6-minute walk test on 02/15/2021 at pulmonary rehabilitation.  She walked 7 and 15 feet in 6 minutes.  She did not break at all.  Her pulse ox dropped below 88% 2 times.  She corrected with 4 L of oxygen.  Her resting oxygen saturation was 96%.  And lowest was 86%.  She also had right heart catheterization in December 2021.  She has elevated pulmonary pressures but also elevated wedge.  Cardiology is following this up and she is on diuresis.  We spent some time discussing her care option.  She had pulmonary function test that shows stability in the last year and a half but declined in 2 years time.  Her high-resolution CT scan of the chest done in summer 2021 shows stability over 4 years.  She is not interested in antifibrotic's because of her colitis and also given her stability.  She does not want to go through pulmonary lung biopsy given the risks  She generally tries to avoid medications.  She is happy going to pulmonary rehabilitation.  We discussed inhaled treprostinil  as approved therapy for pulmonary hypertension -expressed to her the overall safety profile for over 20 years and approved recently for pulmonary hypertension and WHO group 3.  Explained the minimal side effect risk but she does not want to go through this  We  discussed the option of participating in a clinical trial.  We discussed what is called the PULSE inhaled nitric oxide device study.  This is a 28-week plus study.  It was a device and a nitric oxide supply study.  She is actually interested in this.  She is participating clinical trials.  She understands the voluntary nature of this.  She understands the risks that come with clinical trials.  She understands the control of risk through close follow-up and interventions..  However her 6-minute walk test is quite adequate and also she starting pulmonary rehabilitation so she would not be able to participate in this trial for several months.  In addition she is aware that inhaled treprostinil which is standard of care therapy is currently on exclusion in this protocol current amendment.  She understands if she qualifies and chooses this trial that this would be a limitation.  We have given her the consent for review but at this time she does not qualify for this trial.  HRCT July 2021   IMPRESSION: 1. Spectrum of findings suggestive of a mild basilar predominant fibrotic interstitial lung disease without appreciable interval progression since baseline 05/12/2017 high-resolution chest CT. Favor NSIP, with UIP not excluded. Findings are indeterminate for UIP per consensus guidelines: Diagnosis of Idiopathic Pulmonary Fibrosis: An Official ATS/ERS/JRS/ALAT Clinical Practice Guideline. [place], Iss 5, [phone], Aug 26 2017. 2. Dilated main pulmonary artery, stable, suggesting chronic pulmonary arterial hypertension. 3. Aortic Atherosclerosis (ICD10-I70.0).     Electronically Signed   By: Ilona Sorrel M.D.   On: 07/10/2020 15:50     OV 05/31/2021  Subjective:  Patient ID: Patty [name], female , DOB: 1951/10/10 , age 38 y.o. , MRN: [id number] , ADDRESS: [place] [address] PCP [name], MD Patient Care Team: [name], MD as PCP  - General (Family Medicine) Pyrtle, Lajuan Lines, MD as Consulting Physician (Gastroenterology) Shamleffer, Melanie Crazier, MD as Consulting Physician (Endocrinology) Brand Males, MD as Consulting Physician (Pulmonary Disease)  This Provider for this visit: Treatment Team:  Attending Provider: Brand Males, MD    Follow-up dyspnea that is multifactorial due to obesity, physical deconditioning and diastolic dysfunction, ILD and new dx Group 3 PAH - dec 2021  45 pppd prior smoking hix  Follow-up  mild interstitial lung disease not otherwise specifieds -with stability 2018 through 2020 [non-IPF pattern/indeterminate   -on observation  -last CT Mach 2020 - > July 2021 without progression  Inconsisent dxx of  pulmonary emphysema present on high-resolution CT chest - alpha 1 MM  - o2 with exertion  - advised spiriva oct 2020 but too expensive  Reported in only 1 CT. No mention in July 2021 CT  WHO group 3 Pulm htn with elevated PCWD - [facility] 12/07/20:   RA 13/12 mean 9 mmHg RV 48/8, EDP 12 PA 50/29 mean 38 PCWP 22/20 mean 20   O2 saturations: PA 62, Ao 97, SVC 64   CO 4.26 L/m, CI 1.96 Trans-pulmonic gradient 18 mmHg PVR 4.2 Woods Units  Grade 1 Disast dysfhn  - reportred July 2021 echo  Obesity   05/31/2021 -   Chief Complaint  Patient presents with  Follow-up    Pt states she has been doing okay since last visit. States she has begun pulmonary rehab which has helped.     HPI Patty [name] 70 y.o. -returns for follow-up.  She continues on supportive care with oxygen.  Room air at rest 3-5 L pulsed with oxygen.  For the last few months she is on pulsed oxygen.  She uses 2 L nasal cannula at night with her CPAP.  She is completed pulmonary rehabilitation.  For the last few months she is using pulsed oxygen.  She feels this works better.  She is [facility] [employer] [facility].  She does elliptical or treadmill.  She feels her symptoms have improved.  She is continue to  gain some weight though.  She again reported that she feels sensitive to medication and wants avoid antifibrotic's.  She was interested this time and discussing treprostinil versus pulsed research protocol for patients with interstitial lung disease and hypoxemic respiratory failure.  She has pulmonary hypertension based on 2021 December right heart catheterization.  We discussed the long-term safety profile of inhaled treprostinil.  Discussed the side effect profile.  After reflection about the fact the increase study showed improvement in 6-minute walk test, subgroup analysis potential modification of ILD and other composite outcome improvement.     IMPRESSION: 1. Spectrum of findings suggestive of a mild basilar predominant fibrotic interstitial lung disease without appreciable interval progression since baseline 05/12/2017 high-resolution chest CT. Favor NSIP, with UIP not excluded. Findings are indeterminate for UIP per consensus guidelines: Diagnosis of Idiopathic Pulmonary Fibrosis: An Official ATS/ERS/JRS/ALAT Clinical Practice Guideline. [place], Iss 5, [phone], Aug 26 2017. 2. Dilated main pulmonary artery, stable, suggesting chronic pulmonary arterial hypertension. 3. Aortic Atherosclerosis (ICD10-I70.0).     Electronically Signed   By: Ilona Sorrel M.D.   On: 07/10/2020 15:50   OV 09/07/2021  Subjective:  Patient ID: Patty [name], female , DOB: June 30, 1951 , age 41 y.o. , MRN: [id number] , ADDRESS: [place] [id number] PCP [name], MD Patient Care Team: [name], MD as PCP - General (Family Medicine) Hilarie Fredrickson, Lajuan Lines, MD as Consulting Physician (Gastroenterology) [facility], Melanie Crazier, MD as Consulting Physician (Endocrinology) Brand Males, MD as Consulting Physician (Pulmonary Disease)  This Provider for this visit: Treatment Team:  Attending Provider: Brand Males, MD  Chronic  hypoxemic respiratory failure due to all of the below ILD (interstitial lung disease) [facility])  -  Decline PF March 2020 through February 2022  but stable October 2020 through February 2022 on pulmonary function test -Stable on CT scan of the chest 2018 through July 2021 -Non-IPF likely - such as NSIP -Overall supportive care of plan without antifibrotic's given history of colitis [antifibrotic's can exacerbate this situation] and prior stability -You are using room air at rest with 3-5 L with exertion -pulse oxygen for the last few months    WHO group 3 pulmonary arterial hypertension ([place]) with elevaed PCWP to 20 on Dec 2021 - started tyvaso July 2022  [place] group 3 pulmonary arterial hypertension ([place]) with elevaed PCWP to 20 on Dec 2021 - -Continue CPAP at ngith with 2L [place] at night  09/07/2021 -   Chief Complaint  Patient presents with   Follow-up    PFT performed today.  States that she still becomes SOB with exertion. Pt is now on Tyvaso and states she finds herself needing her O2 more.  HPI Patty [name] 70 y.o. -returns for follow-up.  She started treprostinil in July 2022.  She says initially started doing better.  But once she got to 7 puffs 4 times daily she started getting more short of breath.  She felt overall that she is able to take a deep breath and feel less fatigued but when she got to that dose she said 1 time she walked across the room without oxygen which she normally walks without oxygen and she felt dizzy and winded.  Since then the symptoms have persisted.  She is also gained significant amount of weight.  She is gained 16 pounds.  Her pulmonary function test is worse with a decline in both FVC and DLCO.  She says she is now needing 6 L of nasal cannula oxygen when she exercises.  Previously 5 L nasal cannula.  Symptom score wise and walking desaturation test wise she seems the same.  She does not want to do antifibrotic's because of prior history of colectomy.   At this point she is not sure if the treprostinil is helping her or hurting her or not doing anything.    SYMPTOM SCALE - ILD 03/12/2019  10/07/2019 30# wt gain  06/23/2020 250# 09/07/2021 266# - tyvaso since July 2022  O2 use RA ra    Shortness of Breath 0 -> 5 scale with 5 being worst (score 6 If unable to do)   0  At rest 0 0 0 0 with o2  Simple tasks - showers, clothes change, eating, shaving 0 0  0  Household (dishes, doing bed, laundry) 0 0 0 0  Shopping 0 0 0 0  Walking at own pace 0 4.5 4.5 3  Walking up Stairs 1-_0 Total (40 - 48) Dyspnea Score 2 9.5 9.5 6 al with o2  How bad is your cough? 0 0 0 0  How bad is your fatigue 0 1 0 2  nausea   0 0  vomit   0 0  diarrhe   0 2  axniety   3 2  depression   0 1        Simple office walk 185 feet x  3 laps goal with forehead probe 03/12/2019  10/07/2019  06/23/2020  09/07/2021   O2 used Room air Room air Room air ra  Number laps completed 3 2nd lap 1 lap 1 lap  Comments about pace slow slow slow   Resting Pulse Ox/HR 100% and 85/min 96% and 72/min 97% and 77 97% and 76  Final Pulse Ox/HR 88% and 135/min 86% and 105/min 87% and 112/min 83 and 112/min  Desaturated </= 88% yes     Desaturated <= 3% points Yes, 12     Got Tachycardic >/= 90/min yes     Symptoms at end of test Mild dyspnea  Corrected with 5L [place]. Even with 4L [place] downt to  87% Corrected with 6LNC at 94$ for antoher 2lpas  Miscellaneous comments x  ? worse     PFT  PFT Results Latest Ref Rng & Units 09/07/2021 02/22/2021 06/09/2020 10/01/2019 03/12/2019 08/28/2018 02/05/2018  FVC-Pre L 2.28 2.38 2.42 2.38 2.73 2.80 2.57  FVC-Predicted Pre % 79 81 82 81 91 94 83  FVC-Post L - - - - - - -  FVC-Predicted Post % - - - - - - -  Pre FEV1/FVC % % 62 63 63 69 67 70 67  Post FEV1/FCV % % - - - - - - -  FEV1-Pre L 1.41 1.50 1.53 1.65 1.83 1.96 1.71  FEV1-Predicted Pre % 65 68 68 74 80 86 72  FEV1-Post L - - - - - - -  DLCO uncorrected ml/min/mmHg 11.94 15.05 13.48  14.39 13.77 14.53 15.15  DLCO UNC% % 63 79 70 75 72 62 62  DLCO corrected ml/min/mmHg 11.94 27.44 12.89 - - - -  DLCO COR %Predicted % 63 144 67 - - - -  DLVA Predicted % 70 152 70 75 72 65 65  TLC L - - - - - - -  TLC % Predicted % - - - - - - -  RV % Predicted % - - - - - - -       has a past medical history of Abnormal nuclear stress test, Amyloidosis (HCC), Anxiety, Arthritis, Crohn disease ([place]), Crohn's colitis ([place]), DVT (deep venous thrombosis) ([place]), Dyspnea, Grade I diastolic dysfunction, Hyperlipidemia, ILD (interstitial lung disease) ([place]), OSA on CPAP, Pernicious anemia, Polycythemia vera ([place]), Pulmonary embolism ([place]) (2010), and Sleep disorder (01/10/2020).   reports that she quit smoking about 12 years ago. Her smoking use included cigarettes. She has a 45.00 pack-year smoking history. She has never used smokeless tobacco.  Past Surgical History:  Procedure Laterality Date    RIGHT BREAST LUMPECTOMY WITH RADIOACTIVE SEED LOCALIZATION (Right Breast)  02/04/2021   BIOPSY  09/30/2019   Procedure: BIOPSY;  Surgeon: Jerene Bears, MD;  Location: [facility];  Service: Gastroenterology;;   BREAST LUMPECTOMY WITH RADIOACTIVE SEED LOCALIZATION Right 02/04/2021   Procedure: RIGHT BREAST LUMPECTOMY WITH RADIOACTIVE SEED LOCALIZATION;  Surgeon: Coralie Keens, MD;  Location: [facility];  Service: General;  Laterality: Right;   CARDIAC CATHETERIZATION     COLON RESECTION  1993   COLONOSCOPY WITH PROPOFOL N/A 09/30/2019   Procedure: COLONOSCOPY WITH PROPOFOL;  Surgeon: Jerene Bears, MD;  Location: [facility];  Service: Gastroenterology;  Laterality: N/A;   HIP ARTHROPLASTY Right    x 2, initial right THA, then subsequent right THA revision    LEFT HEART CATH AND CORONARY ANGIOGRAPHY N/A 07/28/2017   Procedure: Left Heart Cath and Coronary Angiography;  Surgeon: Burnell Blanks, MD;  Location: [facility];  Service: Cardiovascular;  Laterality: N/A;   POLYPECTOMY   09/30/2019   Procedure: POLYPECTOMY;  Surgeon: Jerene Bears, MD;  Location: [facility];  Service: Gastroenterology;;   RIGHT HEART CATH N/A 12/07/2020   Procedure: RIGHT HEART CATH;  Surgeon: Sherren Mocha, MD;  Location: [facility];  Service: Cardiovascular;  Laterality: N/A;    Allergies  Allergen Reactions   Victoza [Liraglutide] Shortness Of Breath   Lasix [Furosemide] Itching    Hypersensitivity - pt can take as needed    Metformin And Related Diarrhea    Increased moodiness   Onion Other (See Comments)    sereve diarrhea, stomach pains (Crohn's flares)   Potassium-Containing Compounds Other (See Comments)    Due to chron's, difficult passing though kidneys not allowing absorption in the body    Prednisone Other (See Comments)    Altered mood   Sulfa Drugs Cross Reactors Other (See Comments)    "feels like bugs crawling on me"    Immunization History  Administered Date(s) Administered   Fluad Quad(high Dose 65+) 10/07/2019, 09/09/2020   Influenza, High Dose Seasonal PF 08/28/2018   Influenza-Unspecified 11/23/2017   PFIZER Comirnaty(Gray Top)Covid-19 Tri-Sucrose Vaccine 05/14/2021   PFIZER(Purple Top)SARS-COV-2 Vaccination 02/16/2020, 03/17/2020, 12/14/2020   Pneumococcal Conjugate-13 02/13/2018   Pneumococcal Polysaccharide-23  03/12/2019    Family History  Problem Relation Age of Onset   Glaucoma Mother    High blood pressure Mother    High Cholesterol Mother    Sleep apnea Mother    Diabetes Mellitus II Father    Sleep apnea Father    Anxiety disorder Father    COPD Brother    Heart disease Brother    Non-Hodgkin's lymphoma Brother    Diabetes Mellitus II Brother    Glaucoma Brother    Colon cancer Neg Hx    Esophageal cancer Neg Hx    Pancreatic cancer Neg Hx    Stomach cancer Neg Hx    Liver disease Neg Hx      Current Outpatient Medications:    amoxicillin (AMOXIL) 500 MG capsule, Take 2,000 mg by mouth See admin instructions. Before  dental procedures, Disp: , Rfl:    aspirin EC 81 MG tablet, Take 81 mg by mouth daily., Disp: , Rfl:    b complex vitamins capsule, Take 1 capsule by mouth once a week., Disp: , Rfl:    Calcium Carbonate-Vit D-Min (CALCIUM 1200) 1200-1000 MG-UNIT CHEW, Chew 1 tablet by mouth daily., Disp: , Rfl:    CANNABIDIOL PO, Take 1 Dose by mouth 2 (two) times daily as needed (anxiety/sleep.). CBD Oil, Disp: , Rfl:    cetirizine (ZYRTEC) 10 MG tablet, Take 10 mg by mouth daily as needed (sinus/allergies.)., Disp: , Rfl:    cetirizine-pseudoephedrine (ZYRTEC-D) 5-120 MG tablet, Take 1 tablet by mouth daily as needed (sinus headaches.)., Disp: , Rfl:    clonazePAM (KLONOPIN) 0.5 MG tablet, TAKE 1/2 TO 1 TABLETS (0.25-0.5 MG TOTAL) BY MOUTH DAILY AS NEEDED (FOR ANXIETY OR SLEEP.)., Disp: 90 tablet, Rfl: 1   cyanocobalamin (,VITAMIN B-12,) 1000 MCG/ML injection, INJECT 1 ML (1,000 MCG TOTAL) INTO THE MUSCLE EVERY 30 (THIRTY) DAYS. INJECT 1 ML (1,000 MCG) INTO THE MUSCLE EVERY 30 DAYS, Disp: 12 mL, Rfl: 0   FLUoxetine (PROZAC) 20 MG capsule, Take 1 capsule (20 mg total) by mouth daily., Disp: 90 capsule, Rfl: 3   fluticasone (FLONASE) 50 MCG/ACT nasal spray, Place 1-2 sprays into both nostrils daily as needed for allergies or rhinitis., Disp: , Rfl:    Glycerin-Polysorbate 80 (REFRESH DRY EYE THERAPY OP), Place 1 drop into both eyes daily., Disp: , Rfl:    hydrocortisone (PROCTOZONE-HC) 2.5 % rectal cream, Place 1 application rectally 2 (two) times daily as needed (Crohn's flare)., Disp: 28 g, Rfl: 2   ibuprofen (ADVIL) 200 MG tablet, Take 400-800 mg by mouth every 8 (eight) hours as needed (pain.)., Disp: , Rfl:    mesalamine (PENTASA) 250 MG CR capsule, Take 250-500 mg by mouth 4 (four) times daily as needed (crohn's disease flare ups)., Disp: , Rfl:    Multiple Vitamins-Minerals (ICAPS AREDS 2 PO), Take 1 tablet by mouth daily., Disp: , Rfl:    prednisoLONE acetate (PRED FORTE) 1 % ophthalmic suspension, Place 1  drop into both eyes 2 (two) times daily as needed (Uveitis)., Disp: , Rfl:    Probiotic Product (PROBIOTIC PO), Take 1 capsule by mouth 2 (two) times a week. As needed, Disp: , Rfl:    traMADol (ULTRAM) 50 MG tablet, Take 1 tablet (50 mg total) by mouth every 6 (six) hours as needed., Disp: 20 tablet, Rfl: 0   Treprostinil (TYVASO) 0.6 MG/ML SOLN, Inhale into the lungs 4 (four) times daily., Disp: , Rfl:    furosemide (LASIX) 20 MG tablet, Take 1 tablet (20 mg  total) by mouth as needed for fluid., Disp: 90 tablet, Rfl: 3 No current facility-administered medications for this visit.  Facility-Administered Medications Ordered in Other Visits:    6 CHG cloth bath night before surgery, , , Once **AND** 6 CHG cloth bath AM of surgery, , , Once **AND** Chlorhexidine Gluconate Cloth 2 % PADS 6 each, 6 each, Topical, Once **AND** Chlorhexidine Gluconate Cloth 2 % PADS 6 each, 6 each, Topical, Once, Coralie Keens, MD      Objective:   Vitals:   09/07/21 1352  BP: 126/74  Pulse: 72  Temp: 98.1 F (36.7 C)  TempSrc: Oral  SpO2: 97%  Weight: 266 lb 6.4 oz (120.8 kg)  Height: 5' 3.25" (1.607 m)    Estimated body mass index is 46.82 kg/m as calculated from the following:   Height as of this encounter: 5' 3.25" (1.607 m).   Weight as of this encounter: 266 lb 6.4 oz (120.8 kg).  _0 @  Filed Weights   09/07/21 1352  Weight: 266 lb 6.4 oz (120.8 kg)     Physical Exam  General: No distress. Morbidly obese Neuro: Alert and Oriented x 3. GCS 15. Speech normal Psych: Pleasant Resp:  Barrel Chest - no.  Wheeze - no, Crackles - yes, No overt respiratory distress CVS: Normal heart sounds. Murmurs - no Ext: Stigmata of Connective Tissue Disease - no HEENT: Normal upper airway. PEERL +. No post nasal drip        Assessment:       ICD-10-CM   1. Chronic hypoxemic respiratory failure (HCC)  J96.11     2. ILD (interstitial lung disease) (HCC)  J84.9 Pulmonary function test     3. WHO group 3 pulmonary arterial hypertension (HCC)  I27.23     4. OSA on CPAP  G47.33    Z99.89     5. Dyspnea on exertion  R06.00          Plan:     Patient Instructions  Chronic hypoxemic respiratory failure due to all of the below ILD (interstitial lung disease) ([place]) WHO group 3 pulmonary arterial hypertension ([place]) with elevaed PCWP to 20 on Dec 2021  Pulmonary function test shows continued decline.  I think this is most likely due to progressive pulmonary fibrosis.  Alternatively it could be the Tyvaso that he started in July 2022 is contributing to the decline.  Noted that you r now needing 6 L of nasal cannula with exertion,  2L nasal cannula at night and room air at rest  Plan -Stop Tyvaso  and we will monitor to see if stopping Tyvaso stabilizes lung function --Continue oxygen 3-6 L nasal cannula pulsed with exertion and at night 2 L nasal cannula and room air at rest -Continue exercise program  -However continue to keep her pulse ox at greater than or equal to 88% using a pulse oximeter -Take ILD-PRO registry consent -Do spirometry and DLCO in 3 months -to see continued progression versus stabilization of Tyvaso -We can evaluate you for some IV intervention phase 3 clinical trials if available and you are eligible  - Hold off on antifibrotic's given the fact you have had colectomy given the fact you have had  GI side effects -Make attempts to lose weight   OSA on CPAP  Stable  plan -Continue CPAP at ngith with 2L [place] at night  per the advice of his sleep doctor - at some point can check o2 level at night   Follow-up -6 weeks video  virtual visit with Dr. Chase Caller -3 months clinical follow-up in a 30-minute slot with Dr. Chase Caller but after breathing test  -  [symptom score and simple walking desaturation test at follow-up in 3 months  ( Level 05 visit: Estb 40-54 min    in  visit type: on-site physical face to visit  in total care time and counseling or/and  coordination of care by this undersigned MD - Dr Brand Males. This includes one or more of the following on this same day 09/07/2021: pre-charting, chart review, note writing, documentation discussion of test results, diagnostic or treatment recommendations, prognosis, risks and benefits of management options, instructions, education, compliance or risk-factor reduction. It excludes time spent by the [place] or office staff in the care of the patient. Actual time 40 min)   SIGNATURE    Dr. Brand Males, M.D., F.C.C.P,  Pulmonary and Critical Care Medicine Staff Physician, [place] Director - Interstitial Lung Disease  Program  [facility] [place], [place], [address]  Pager: [phone], If no answer or between  15:00h - 7:00h: call [phone] Telephone: [phone]  2:59 PM 09/07/2021

## 2021-09-07 NOTE — Progress Notes (Signed)
Spirometry and Dlco done today. 

## 2021-09-10 ENCOUNTER — Telehealth: Payer: Self-pay | Admitting: Internal Medicine

## 2021-09-10 NOTE — Telephone Encounter (Signed)
Hello Dr. Purnell Shoemaker, I have concerns after noticing a significant decrease in oxygen levels since yesterday. Walking within the house (bedroom to bath or kitchen) that previously did not lower my O2 level has dropped between 84-88. I never had any O2 drop when taking a shower but today it was 79 after the shower. My chest has tightened since I stopped Tyvaso on Tuesday at 1215p.  Yesterday I researched Antifibrotic Medications and their effects on Crohn's disease.  It seems that these drugs have shown some positive results in patients with Crohn's Disease in reducing intestinal inflammation. The studies do show that antifibrotics can induce colitis but can be treated with anti-inflammatories. This information combined with the fact that Tyvaso also seemed to improve my Crohn's I have a suggestion for treatment.  My suggestion is to restart the Tyvaso at 3 breaths 4 times a day and start Ofev or Esbriet at the lowest possible dose.  Please review the attachments and advise me of your thoughts. I think it is best to restart Tyvaso and begin Antifibrotic sooner than 6 weeks would be best. We can know if I can tolerate the medication within that period and will have treatment for both ILD and PH.   Article too large to send - Nintedanib regulates intestinal smooth muscle hyperplasia - Chronic inflammation of the human intestine in Crohn's disease (CD) causes bowel wall thickening and phenotype. https://www.nature.com/articles/s41598-022-14491-5  Attachments  comparison of pirfenidone and nintedanib.pdf  Severe Colitis Associated with Pirfenidone Use.pdf  Pirfenidone Also be Effective in Treating Intestinal Fibrosis.pdf  Antifibrotic Cost.docx  Spoke with the pt. She states the sats she talked about in her email were on RA. I reminded her needs to wear her o2 with activity. She is having min chest tightness and feels all of this is bc she has been off of Tyvaso since 09/07/21. She wants to start this back  and add antifibrotic. Aware to seek emergent care if symptoms worsen over w/e. Please advise, thanks

## 2021-09-10 NOTE — Telephone Encounter (Signed)
Called the pt back at the number given and there was no answer-LMTCB.

## 2021-09-10 NOTE — Telephone Encounter (Signed)
ATC LVM to call office back today before close at 5pm so we may follow up on pt O2 saturations leaves.

## 2021-09-13 NOTE — Telephone Encounter (Signed)
Ok let us restart tyvaso  Regarding anti fibrotics - clinically and based on experience I would be more concernred about colitis with ofev and less with esbriet but given fact you want to start I am ok with it.   The Annals ATS article is a case series and is not a trial - so not sure what to make of it  Regardless many thnaks for highlighting great articles in high quality journal  Before starting either - does she want a pharmacy consult with [facility] to start either lower dose ofev or esbriet? I would reocmmend it

## 2021-09-13 NOTE — Telephone Encounter (Signed)
See email from pt 09/10/21

## 2021-09-16 ENCOUNTER — Other Ambulatory Visit: Payer: Self-pay

## 2021-09-16 ENCOUNTER — Telehealth: Payer: Self-pay | Admitting: Pharmacist

## 2021-09-16 ENCOUNTER — Ambulatory Visit: Payer: Medicare Other | Admitting: Pharmacist

## 2021-09-16 DIAGNOSIS — Z7189 Other specified counseling: Secondary | ICD-10-CM

## 2021-09-16 DIAGNOSIS — J849 Interstitial pulmonary disease, unspecified: Secondary | ICD-10-CM

## 2021-09-16 DIAGNOSIS — Z5181 Encounter for therapeutic drug level monitoring: Secondary | ICD-10-CM

## 2021-09-16 NOTE — Telephone Encounter (Signed)
Please start Esbriet BIV.  Dose: 244m three times daily x 2 weeks, then 534 mg three times daily as maintenance (LOW DOSE maintenance dose)  Dx: ILD with progressive phenotype (J84.9)  Therapies patient unable to try:  Ofev - colitis, Crohn's, colectomy procedure  DKnox Saliva PharmD, MPH, BCPS Clinical Pharmacist (Rheumatology and Pulmonology)

## 2021-09-16 NOTE — Progress Notes (Signed)
Subjective:  Patient presents today to [facility] to see pharmacy team to discuss Ofev vs. Esbriet.   Patient was last seen and referred by Dr. Chase Caller on 09/07/21.  Pertinent past medical history includes ILD, pulmonary hypertension, Crohn's disease, history of DVT with PE, dyslipidemia, pernicious anemia. She states that she was told at one time that she has fatty liver disease but no f/u since then.  Based on her history of colitis and colectomy, a shared decision was made to hold off starting antifibrotic   She was started on Tyvaso on 07/01/21 but stopped Tyvaso on 09/07/21 after seeing Dr. Chase Caller. Her PFTs showed continued pulmonary decline and plan was to stop Tyvaso to assess if lung function stabilized. However today, patient reports that she felt increased energy when she initially started Tyvaso and it was when she increased to 7 puffs per dose that she felt short of breath.  Patient sent a MyChart message on 09/10/21 expressing desire to restart Tyvaso and trial antifibrotics.  History of elevated LFTs: No - but most recent labs were in Feb 2022.  History of diarrhea, nausea, vomiting: No. Does have history of colitis  Objective: Allergies  Allergen Reactions   Victoza [Liraglutide] Shortness Of Breath   Lasix [Furosemide] Itching    Hypersensitivity - pt can take as needed    Metformin And Related Diarrhea    Increased moodiness   Onion Other (See Comments)    sereve diarrhea, stomach pains (Crohn's flares)   Potassium-Containing Compounds Other (See Comments)    Due to chron's, difficult passing though kidneys not allowing absorption in the body    Prednisone Other (See Comments)    Altered mood   Sulfa Drugs Cross Reactors Other (See Comments)    "feels like bugs crawling on me"    Outpatient Encounter Medications as of 09/16/2021  Medication Sig   amoxicillin (AMOXIL) 500 MG capsule Take 2,000 mg by mouth See admin instructions. Before dental procedures    aspirin EC 81 MG tablet Take 81 mg by mouth daily.   b complex vitamins capsule Take 1 capsule by mouth once a week.   Calcium Carbonate-Vit D-Min (CALCIUM 1200) 1200-1000 MG-UNIT CHEW Chew 1 tablet by mouth daily.   CANNABIDIOL PO Take 1 Dose by mouth 2 (two) times daily as needed (anxiety/sleep.). CBD Oil   cetirizine (ZYRTEC) 10 MG tablet Take 10 mg by mouth daily as needed (sinus/allergies.).   cetirizine-pseudoephedrine (ZYRTEC-D) 5-120 MG tablet Take 1 tablet by mouth daily as needed (sinus headaches.).   clonazePAM (KLONOPIN) 0.5 MG tablet TAKE 1/2 TO 1 TABLETS (0.25-0.5 MG TOTAL) BY MOUTH DAILY AS NEEDED (FOR ANXIETY OR SLEEP.).   cyanocobalamin (,VITAMIN B-12,) 1000 MCG/ML injection INJECT 1 ML (1,000 MCG TOTAL) INTO THE MUSCLE EVERY 30 (THIRTY) DAYS. INJECT 1 ML (1,000 MCG) INTO THE MUSCLE EVERY 30 DAYS   FLUoxetine (PROZAC) 20 MG capsule Take 1 capsule (20 mg total) by mouth daily.   fluticasone (FLONASE) 50 MCG/ACT nasal spray Place 1-2 sprays into both nostrils daily as needed for allergies or rhinitis.   furosemide (LASIX) 20 MG tablet Take 1 tablet (20 mg total) by mouth as needed for fluid.   Glycerin-Polysorbate 80 (REFRESH DRY EYE THERAPY OP) Place 1 drop into both eyes daily.   hydrocortisone (PROCTOZONE-HC) 2.5 % rectal cream Place 1 application rectally 2 (two) times daily as needed (Crohn's flare).   ibuprofen (ADVIL) 200 MG tablet Take 400-800 mg by mouth every 8 (eight) hours as needed (pain.).  mesalamine (PENTASA) 250 MG CR capsule Take 250-500 mg by mouth 4 (four) times daily as needed (crohn's disease flare ups).   Multiple Vitamins-Minerals (ICAPS AREDS 2 PO) Take 1 tablet by mouth daily.   prednisoLONE acetate (PRED FORTE) 1 % ophthalmic suspension Place 1 drop into both eyes 2 (two) times daily as needed (Uveitis).   Probiotic Product (PROBIOTIC PO) Take 1 capsule by mouth 2 (two) times a week. As needed   traMADol (ULTRAM) 50 MG tablet Take 1 tablet (50 mg total) by  mouth every 6 (six) hours as needed.   Treprostinil (TYVASO) 0.6 MG/ML SOLN Inhale into the lungs 4 (four) times daily.   Facility-Administered Encounter Medications as of 09/16/2021  Medication   Chlorhexidine Gluconate Cloth 2 % PADS 6 each   And   Chlorhexidine Gluconate Cloth 2 % PADS 6 each     Immunization History  Administered Date(s) Administered   Fluad Quad(high Dose 65+) 10/07/2019, 09/09/2020   Influenza, High Dose Seasonal PF 08/28/2018   Influenza-Unspecified 11/23/2017   PFIZER Comirnaty(Gray Top)Covid-19 Tri-Sucrose Vaccine 05/14/2021   PFIZER(Purple Top)SARS-COV-2 Vaccination 02/16/2020, 03/17/2020, 12/14/2020   Pneumococcal Conjugate-13 02/13/2018   Pneumococcal Polysaccharide-23 03/12/2019    PFT's TLC  Date Value Ref Range Status  05/17/2017 5.09 L Final    CMP     Component Value Date/Time   NA 137 02/05/2021 0015   NA 139 12/04/2020 1456   K 4.0 02/05/2021 0015   CL 104 02/05/2021 0015   CO2 23 02/05/2021 0015   GLUCOSE 147 (H) 02/05/2021 0015   BUN 18 02/05/2021 0015   BUN 23 12/04/2020 1456   CREATININE 0.75 02/05/2021 0015   CREATININE 0.81 09/09/2020 1557   CALCIUM 8.9 02/05/2021 0015   PROT 7.3 02/01/2021 1431   PROT 7.0 01/16/2020 1714   ALBUMIN 4.1 02/01/2021 1431   ALBUMIN 4.3 01/16/2020 1714   AST 16 02/01/2021 1431   ALT 16 02/01/2021 1431   ALKPHOS 64 02/01/2021 1431   BILITOT 0.6 02/01/2021 1431   BILITOT 0.2 01/16/2020 1714   GFRNONAA >60 02/05/2021 0015   GFRNONAA 74 09/09/2020 1557   GFRAA 74 12/04/2020 1456   GFRAA 85 09/09/2020 1557    CBC    Component Value Date/Time   WBC 9.5 02/05/2021 0015   RBC 4.45 02/05/2021 0015   HGB 12.9 02/05/2021 0015   HGB 15.1 12/04/2020 1456   HCT 40.8 02/05/2021 0015   HCT 45.5 12/04/2020 1456   PLT 283 02/05/2021 0015   PLT 316 12/04/2020 1456   MCV 91.7 02/05/2021 0015   MCV 90 12/04/2020 1456   MCH 29.0 02/05/2021 0015   MCHC 31.6 02/05/2021 0015   RDW 14.4 02/05/2021 0015    RDW 14.0 12/04/2020 1456   LYMPHSABS 1.6 02/01/2021 1431   LYMPHSABS 2.0 12/04/2020 1456   MONOABS 0.5 02/01/2021 1431   EOSABS 0.1 02/01/2021 1431   EOSABS 0.2 12/04/2020 1456   BASOSABS 0.0 02/01/2021 1431   BASOSABS 0.1 12/04/2020 1456    LFT's Hepatic Function Latest Ref Rng & Units 02/01/2021 09/09/2020 06/23/2020  Total Protein 6.0 - 8.3 g/dL 7.3 6.8 7.4  Albumin 3.5 - 5.2 g/dL 4.1 - 4.2  AST 0 - 37 U/L _0 ALT 0 - 35 U/L _1 Alk Phosphatase 39 - 117 U/L 64 - 67  Total Bilirubin 0.2 - 1.2 mg/dL 0.6 0.4 0.4    HRCT (07/10/20): Spectrum of findings suggestive of a mild basilar predominant fibrotic interstitial lung disease  without appreciable interval progression since baseline 05/12/2017 high-resolution chest CT. Favor NSIP, with UIP not excluded. Findings are indeterminate for UIP per consensus guidelines  Assessment and Plan Ms. Bucio does not have any specific contraindication to Ofev or Esbriet use and we reviewed both medications in detail as below.  Reviewed that the goal is to slow progression of ILD for both. Reviewed similar hepatic function monitoring. Due to greater incidence of diarrhea with Ofev and given her GI history of colitis and Crohn's, she would like to trial Esbriet and stay at low dose. We did review that diarrhea is possible with Esbriet as well but patients experience nausea more frequently.   Esbriet Medication Management Thoroughly counseled patient on the efficacy, mechanism of action, dosing, administration, adverse effects, and monitoring parameters of Esbriet.  Patient verbalized understanding. Patient education handout provided.    Goals of Therapy: Will not stop or reverse the progression of ILD. It will slow the progression of ILD.    Dosing: Starting dose will be Esbriet 267 mg 1 tablet three times daily for 2 weeks, then increase to 2 tablets three times daily as maintenance dose. Stressed the importance of taking with meals and space  at least 5-6 hours apart to minimize stomach upset.    Adverse Effects: Nausea, vomiting, diarrhea, weight loss Abdominal pain GERD Sun sensitivity/rash - patient advised to wear sunscreen when exposed to sunlight Dizziness Fatigue   Monitoring: Draw hepatic function panel today for updated baseline Monitor for diarrhea, nausea and vomiting, GI perforation, hepatotoxicity  Monitor LFTs - baseline, monthly for first 6 months, then every 3 months routinely CBC w differential at baseline and every 3 months routinely   Access: Rx to be sent to pharmacy pending approval.   Ofev Medication Management Thoroughly counseled patient on the efficacy, mechanism of action, dosing, administration, adverse effects, and monitoring parameters of Ofev. Patient verbalized understanding. Patient education handout provided.    Goals of Therapy: Will not stop or reverse the progression of ILD. It will slow the progression of ILD.  Inhibits tyrosine kinase inhibitors which slow the fibrosis/progression of ILD -Significant reduction in the rate of disease progression was observed after treatment (61.1% [before] vs 33.3% [after], P?=?0.008) over 42 weeks.   Ofev Dosing: 150 mg (one capsule) by mouth twice daily (approx 12 hours apart). Discussed taking with food approximately 12 hours apart. Discussed that capsule should not be crushed or split.   Ofev Adverse Effects: Nausea, vomiting, diarrhea (2 in 3 patients) appetite loss, weight loss - management of diarrhea with loperamide discussed including max use of 48 hours and max of 8 capsules per day. Abdominal pain (up to 1 in 5 patients) Nasopharyngitis (13%), UTI (6%) Risk of thrombosis (3%) and acute MI (2%) Hypertension (5%) Dizziness Fatigue (10%)   Medication Reconciliation A drug regimen assessment was performed, including review of allergies, interactions, disease-state management, dosing and immunization history. Medications were reviewed with  the patient, including name, instructions, indication, goals of therapy, potential side effects, importance of adherence, and safe use.   Immunizations She is UTD on pneumonia vaccine. She is eligible for zoster vaccine. Patient has received 4 COVID19 vaccines.   PLAN: Repeat CMP and CBC before starting Esbriet. Patient completed her portion of Esbriet patient assistance paperwork. Will place provider portion in Dr. Golden Pop folder. Once approved, her dose will be Esbriet 267 mg pending confirmation from Dr. Chase Caller (1 tab three times daily x 2 weeks, then increase to 2 tabs three times daily as maintenance  and if tolerated at low-dose given her GI history and concerns for Crohn's/colitis exacerbation)  Knox Saliva, PharmD, MPH, BCPS Clinical Pharmacist (Rheumatology and Pulmonology)

## 2021-09-16 NOTE — Patient Instructions (Signed)
Great seeing you again!  Pharmacy team will start benefits investigation for Esbriet. We will reach out to you once we get updates. This process can take 2-3 weeks at least.  We will need updated labs prior to starting Esbriet. I'll let you know once you're approved so youcan stop by the clinic to have labs drawn.  Questions or concerns, please call clinic and ask for the pharmacist  [facility] (Clinical Pharmacist)

## 2021-09-17 ENCOUNTER — Telehealth: Payer: Self-pay | Admitting: Pharmacy Technician

## 2021-09-17 NOTE — Telephone Encounter (Signed)
ENTRY ERROR

## 2021-09-17 NOTE — Telephone Encounter (Signed)
Submitted a Prior Authorization request to [employer] for ESBRIET via CoverMyMeds. Will update once we receive a response.     KEy: [id number]

## 2021-09-20 ENCOUNTER — Telehealth: Payer: Self-pay | Admitting: Pharmacy Technician

## 2021-09-20 ENCOUNTER — Telehealth: Payer: Self-pay | Admitting: Internal Medicine

## 2021-09-20 ENCOUNTER — Other Ambulatory Visit (HOSPITAL_COMMUNITY): Payer: Self-pay

## 2021-09-20 NOTE — Telephone Encounter (Signed)
Received notification from [place] regarding a prior authorization for ESBRIET. Authorization has been APPROVED from 08/18/21 to 09/16/22.     Authorization # [id number]

## 2021-09-20 NOTE — Telephone Encounter (Signed)
Received notification from [place] regarding a prior authorization for PIRFENIDONE. Authorization has been APPROVED from 08/18/21 to 08/17/22.   Unable to run test claim but called benefits department, and copay for 30 days supply is $2690.34. Called patient to advise and that we will submit PAP application.  Authorization # [id number] Phone # [phone]  Submitted Patient Assistance Application to [facility] for [place] along with provider portion, PA, patient portion. Will update patient when we receive a response.  Fax# [phone] Phone# [phone]  Knox Saliva, PharmD, MPH, BCPS Clinical Pharmacist (Rheumatology and Pulmonology)

## 2021-09-20 NOTE — Telephone Encounter (Signed)
Returned call to patient. She was inquiring about labwork - reviewed that she can have completed at any point before she starts Esbriet. Lab orders for CBC and CMP are in place.  Reviewed Esbriet BIV findings and need to submit PAP application. She verbalized understanding.  Knox Saliva, PharmD, MPH, BCPS Clinical Pharmacist (Rheumatology and Pulmonology)

## 2021-09-20 NOTE — Telephone Encounter (Signed)
KEY: [id number]

## 2021-09-22 ENCOUNTER — Ambulatory Visit: Payer: Medicare Other | Admitting: Family Medicine

## 2021-09-27 NOTE — Telephone Encounter (Signed)
[facility], application is in process. They will push up application to be reviewed by end of day today.  Phone# [phone]

## 2021-09-28 NOTE — Telephone Encounter (Signed)
Received notification from [place] that date of birth on PAP application was ineligible.  Refaxed provider and patient portion  Fax# [phone] Phone# [phone]  Knox Saliva, PharmD, MPH, BCPS Clinical Pharmacist (Rheumatology and Pulmonology)

## 2021-10-01 ENCOUNTER — Telehealth: Payer: Self-pay | Admitting: Internal Medicine

## 2021-10-04 NOTE — Telephone Encounter (Signed)
Received the following message from patient:   "I have received a [employer] duty notice and see some difficulty in accepting due to my 4-hour regiment of Tyvaso to be taken in a courthouse setting. There are also unknown effects of the new medication when I begin taking it.  Would you provide me with a letter explaining my disability and how it affects my ability to serve? This is what they require - not a simple note.  If not I will try to work it out. Thanks, Patty [name]"  I sent a message back to the patient to see if she had a juror number or summons number so that we can include it in the letter.   MR, please advise. Thanks.

## 2021-10-04 NOTE — Telephone Encounter (Signed)
TO Whom It May Concern  Regardig: Patty [name] with date of birth 08/21/1951 - Fitness to Serve In [place]  The above-named patient is a patient of mine in the pulmonary clinic here in [place], [place].  She has advanced lung disease is on complex medical therapy.  She is completely unfit to do jury duty because of her complex medication regimen and health status.  Is applicable from both a physical standpoint and an emotional standpoint and from a pandemic standpoint.  Under no circumstances should she do jury duty.  Please do not hesitate to contact me if you have any questions      SIGNATURE    Dr. Brand Males, M.D., F.C.C.P,  Pulmonary and Critical Care Medicine Staff Physician, [place] Director - Interstitial Lung Disease  Program  Pulmonary [place] [facility] [place], [place], [address]  NPI Number:  NPI #[id number]  Pager: [phone], If no answer  -> Check AMION or Try [phone] Telephone (clinical office): [phone] Telephone (research): [phone]  3:13 PM 10/04/2021

## 2021-10-05 ENCOUNTER — Encounter: Payer: Self-pay | Admitting: *Deleted

## 2021-10-05 NOTE — Telephone Encounter (Signed)
Returned call to [place]. F/u will occur in [place] encounter

## 2021-10-05 NOTE — Telephone Encounter (Signed)
Letter done and given to Patty [name] to have MR sign  I called the pt and LMTCB- need to know if she wants this mailed to her or pick up Also will email her this question as well

## 2021-10-05 NOTE — Telephone Encounter (Signed)
[place] for update on patient's Esbriet. Patient is approved through  [place] regarding an approval for [place] patient assistance from 10/05/21 until she remains qualified through income and medication [place] will complete annual phone calls to ensure there are no changes to insurance and income.  Awaiting confirmation fax to be sent to clinic  [place] number: [phone] [facility] phone: [phone]   Knox Saliva, PharmD, MPH, BCPS Clinical Pharmacist (Rheumatology and Pulmonology)

## 2021-10-06 NOTE — Telephone Encounter (Signed)
Approval letter has been received.

## 2021-10-06 NOTE — Telephone Encounter (Addendum)
Left VM for patient about Esbriet approval through [place] PAP. She will need baseline CBC and CMET prior to starting Esbriet.  Future labs orders in place and patient advised about lab hours in VM  Knox Saliva, PharmD, MPH, BCPS Clinical Pharmacist (Rheumatology and Pulmonology)

## 2021-10-07 ENCOUNTER — Other Ambulatory Visit (INDEPENDENT_AMBULATORY_CARE_PROVIDER_SITE_OTHER): Payer: Medicare Other

## 2021-10-07 DIAGNOSIS — Z5181 Encounter for therapeutic drug level monitoring: Secondary | ICD-10-CM | POA: Diagnosis not present

## 2021-10-07 MED ORDER — PIRFENIDONE 267 MG PO TABS: ORAL_TABLET | ORAL | Status: DC

## 2021-10-07 NOTE — Telephone Encounter (Signed)
Letter was given back to [facility] 10/11 once it was signed by MR. Magda Paganini, please advise if you still have the letter.

## 2021-10-07 NOTE — Telephone Encounter (Addendum)
Patient returned call about her Esbriet approval. She states she does not have any further questions at this time. We reviewed dosing of 1 tab three times daily x 2 weeks then increase to 2 tab three times daily thereafter. We reviewed Esbriet in detail at pharmacy clinic visit on 09/16/21  She already completed welcome call with [facility] yesterda,y 10/06/21 and will be planning to call [facility] pharmacy next Monday, 10/11/21 to schedule first shipment  She will be stopping by today or tomorrow for baseline CBC and CMET and pick up signed letter that is left at the front desk for her by Tilden Dome, CMA  Knox Saliva, PharmD, MPH, BCPS Clinical Pharmacist (Rheumatology and Pulmonology)

## 2021-10-07 NOTE — Addendum Note (Signed)
Addended by: Cassandria Anger on: 10/07/2021 03:07 PM   Modules accepted: Orders

## 2021-10-08 LAB — CBC WITH DIFFERENTIAL/PLATELET
Basophils Absolute: 0.1 10*3/uL (ref 0.0–0.1)
Basophils Relative: 0.7 % (ref 0.0–3.0)
Eosinophils Absolute: 0.2 10*3/uL (ref 0.0–0.7)
Eosinophils Relative: 2.5 % (ref 0.0–5.0)
HCT: 42.5 % (ref 36.0–46.0)
Hemoglobin: 13.8 g/dL (ref 12.0–15.0)
Lymphocytes Relative: 20.2 % (ref 12.0–46.0)
Lymphs Abs: 1.6 10*3/uL (ref 0.7–4.0)
MCHC: 32.4 g/dL (ref 30.0–36.0)
MCV: 90.8 fl (ref 78.0–100.0)
Monocytes Absolute: 0.7 10*3/uL (ref 0.1–1.0)
Monocytes Relative: 8.4 % (ref 3.0–12.0)
Neutro Abs: 5.3 10*3/uL (ref 1.4–7.7)
Neutrophils Relative %: 68.2 % (ref 43.0–77.0)
Platelets: 310 10*3/uL (ref 150.0–400.0)
RBC: 4.68 Mil/uL (ref 3.87–5.11)
RDW: 14.9 % (ref 11.5–15.5)
WBC: 7.8 10*3/uL (ref 4.0–10.5)

## 2021-10-08 LAB — COMPREHENSIVE METABOLIC PANEL
ALT: 17 U/L (ref 0–35)
AST: 16 U/L (ref 0–37)
Albumin: 4 g/dL (ref 3.5–5.2)
Alkaline Phosphatase: 69 U/L (ref 39–117)
BUN: 18 mg/dL (ref 6–23)
CO2: 29 mEq/L (ref 19–32)
Calcium: 9.5 mg/dL (ref 8.4–10.5)
Chloride: 102 mEq/L (ref 96–112)
Creatinine, Ser: 0.79 mg/dL (ref 0.40–1.20)
GFR: 75.43 mL/min (ref >60.00)
Glucose, Bld: 133 mg/dL — ABNORMAL HIGH (ref 70–99)
Potassium: 4.1 mEq/L (ref 3.5–5.1)
Sodium: 138 mEq/L (ref 135–145)
Total Bilirubin: 0.4 mg/dL (ref 0.2–1.2)
Total Protein: 6.5 g/dL (ref 6.0–8.3)

## 2021-10-11 NOTE — Telephone Encounter (Signed)
Labs drawn on 10/08/21 wnl to proceed with Esbriet Dose: 237m three times daily x 2 weeks then 5335mthree times daily as maintenance dose  CMP Latest Ref Rng & Units 10/07/2021 02/05/2021 02/01/2021  Glucose 70 - 99 mg/dL 133(H) 147(H) 104(H)  BUN 6 - 23 mg/dL _0 Creatinine 0.40 - 1.20 mg/dL 0.79 0.75 0.81  Sodium 135 - 145 mEq/L 138 137 137  Potassium 3.5 - 5.1 mEq/L 4.1 4.0 4.5  Chloride 96 - 112 mEq/L 102 104 103  CO2 19 - 32 mEq/L _1 Calcium 8.4 - 10.5 mg/dL 9.5 8.9 9.6  Total Protein 6.0 - 8.3 g/dL 6.5 - 7.3  Total Bilirubin 0.2 - 1.2 mg/dL 0.4 - 0.6  Alkaline Phos 39 - 117 U/L 69 - 64  AST 0 - 37 U/L 16 - 16  ALT 0 - 35 U/L 17 - 16   CBC    Component Value Date/Time   WBC 7.8 10/07/2021 1552   RBC 4.68 10/07/2021 1552   HGB 13.8 10/07/2021 1552   HGB 15.1 12/04/2020 1456   HCT 42.5 10/07/2021 1552   HCT 45.5 12/04/2020 1456   PLT 310.0 10/07/2021 1552   PLT 316 12/04/2020 1456   MCV 90.8 10/07/2021 1552   MCV 90 12/04/2020 1456   MCH 29.0 02/05/2021 0015   MCHC 32.4 10/07/2021 1552   RDW 14.9 10/07/2021 1552   RDW 14.0 12/04/2020 1456   LYMPHSABS 1.6 10/07/2021 1552   LYMPHSABS 2.0 12/04/2020 1456   MONOABS 0.7 10/07/2021 1552   EOSABS 0.2 10/07/2021 1552   EOSABS 0.2 12/04/2020 1456   BASOSABS 0.1 10/07/2021 1552   BASOSABS 0.1 12/04/2020 1456    Clessie Karras GaWilhemina BonitoPharmD, MPH, BCPS Clinical Pharmacist (Rheumatology and Pulmonology)

## 2021-10-13 DIAGNOSIS — Z961 Presence of intraocular lens: Secondary | ICD-10-CM | POA: Diagnosis not present

## 2021-10-13 DIAGNOSIS — H43813 Vitreous degeneration, bilateral: Secondary | ICD-10-CM | POA: Diagnosis not present

## 2021-10-13 DIAGNOSIS — H40013 Open angle with borderline findings, low risk, bilateral: Secondary | ICD-10-CM | POA: Diagnosis not present

## 2021-10-13 DIAGNOSIS — H35013 Changes in retinal vascular appearance, bilateral: Secondary | ICD-10-CM | POA: Diagnosis not present

## 2021-10-19 ENCOUNTER — Other Ambulatory Visit: Payer: Self-pay

## 2021-10-19 ENCOUNTER — Ambulatory Visit (INDEPENDENT_AMBULATORY_CARE_PROVIDER_SITE_OTHER): Payer: Medicare Other | Admitting: Internal Medicine

## 2021-10-19 DIAGNOSIS — Z9989 Dependence on other enabling machines and devices: Secondary | ICD-10-CM | POA: Diagnosis not present

## 2021-10-19 DIAGNOSIS — G4733 Obstructive sleep apnea (adult) (pediatric): Secondary | ICD-10-CM | POA: Diagnosis not present

## 2021-10-19 DIAGNOSIS — J9611 Chronic respiratory failure with hypoxia: Secondary | ICD-10-CM | POA: Diagnosis not present

## 2021-10-19 DIAGNOSIS — I2723 Pulmonary hypertension due to lung diseases and hypoxia: Secondary | ICD-10-CM | POA: Diagnosis not present

## 2021-10-19 DIAGNOSIS — J849 Interstitial pulmonary disease, unspecified: Secondary | ICD-10-CM | POA: Diagnosis not present

## 2021-10-19 NOTE — Progress Notes (Signed)
_0  ID: Patty [name], female    DOB: [birth year], 70 y.o.   MRN: [id number]  Chief Complaint  Patient presents with   Follow-up    dyspnea     Referring provider: No ref. provider found  HPI: 70 year old female former smoker seen for pulmonary consulokay guarded because then maybe they can meet him right now for the registryt 05/09/2017 for progressive dyspnea since 2004.[name] Patient has polycythemia vera followed by hematology and [place] She has Crohn's disease    IOV  05/09/2017  Chief Complaint  Patient presents with   Pulmonary Consult    Pt referred for SOB with activity x years. Pt states over the last year the DOE has worsened. Pt denies cough and CP/tightness and f/c/s.     70 year old obese female. In 2004 Started noticing insidious onset of shortness of breath. In 2005 more [place] from [place] and probably gain over 60 pounds of weight. She has extensive workup at that time according to her history. She was then diagnosed with polycythemia by Dr. [name] at [facility]. She does not recollect any phlebotomies for this. However in the last 4-5 years she's had worsening shortness of breath. Noticeable with exertion particularly in the gym and doing standing exercises but not so much with sitting exercises. Relieved by rest. When she does some yard work she notices some associated wheezing as well.  Walking desaturation test in the office she did desaturate to 88% and did get tachycardic. She says that a chest x-ray done by primary care physicians interstitial pulmonary fibrosis. I personally visualized the chest x-ray done 04/03/2017: There is some interstitial markings but it is obscured by her obesity. I'm not so certain that is definite ILD. There is no lab work in the record. There is no PFT a CT scan available for visualization     05/29/2017 Follow up : Dyspnea  Patient returns for a two-week follow-up. Patient was seen for pulmonary consult  05/09/2017 for progressive dyspnea since 2004. Patient was set up for a high resolution CT chest that showed a very mild basilar predominant subpleural reticulation and bronchiectasis which could be due to nonspecific interstitial pneumonitis.. CT did have incidental findings of enlarged pulmonary arteries and aortic and coronary calcification. Patient had pulmonary function test on May 23 that showed an FEV1 at 77%, ratio 73, FVC 81, no significant bronchodilator response, total lung capacity 100%, DLCO 52%. Overnight oximetry test did show significant desaturations. We discussed beginning oxygen at bedtime.  Patient denies significant cough. Says that she get short of breath with walking. Has daytime fatigue and low energy..  Interstitial lung disease patient questionnaire was completed as follows Patient has no previous use of Macrodantin, amiodarone, methotrexate. She has been treated with prednisone for her Crohn's in the past. Patient has had extensive travel with brief visits to multiple state and country's.. [place]. Did live in a apartment that she did notice mold for 4 years. She does have Crohn's disease and was on Mesamaline for many years. Patient says she has had a humidifier and hot tub and previous house. She has no indoor birds. She does have a dog. Patient says she was told that she had a blood clot in the past but was never found.   OV 08/15/2017  Chief Complaint  Patient presents with   Follow-up    Pt still gets occ. SOB. Other than that, pt states that she has been doing good.  Denies any cough or CP.     Follow-up multifactorial dyspnea with interstitial lung disease concern.   Regarding interstitial lung disease concern: Mid May 2018 she had high resolution CT chest that showed interstitial lung disease very mild but unclear if possible UIP pattern. Autoimmune test was negative. Isolated reduction in diffusion capacity 50%. [employer] of chest  physicians questionnaire shows previous Crohn's disease and also mold exposure previously. Overall she feels stable  Regarding dyspnea: She underwent echocardiogram in June 2018 showed grade 1 diastolic dysfunction. Had abnormal cardiac stress test July 2018 followed by cardiac cath early August 2018 that showed normal coronary angiogram but elevated left ventricular end-diastolic pressure. Dietary therapy has been recommended but she is unaware of these results.  Overall she is here to discuss these results.  IMPRESSION: 1. Suspect very mild basilar predominant subpleural reticulation and bronchiolectasis, which may be due to nonspecific interstitial pneumonitis. 2. Aortic atherosclerosis (ICD10-170.0). Coronary artery calcification. 3. Enlarged pulmonary arteries, indicative of pulmonary arterial hypertension.     Electronically Signed   By: Lorin Picket M.D.   On: 05/13/2017 07:33    OV 10/17/2017  Chief Complaint  Patient presents with   Follow-up    Pt states that she has had good days and bad days since last visit. States that when she was in [place] for a month, breathing was better; still became SOB but not as bad as in [place]. Pt's SOB is mainly on exertion. Denies any cough or CP.    Follow-up dyspnea that is multifactorial due to obesity, physical deconditioning and diastolic dysfunction Follow-up mild interstitial lung disease not otherwise specified  Last visit I started her on Lasix. I was only supposed to see herin 6 months or so. However she's had problems tolerating Lasix and potassium. It fluctuates between palpitations and hypertension. She feels the palpitations might be related to potassium depletion after taking Lasix. But she also tells me that the Lasix does help her dyspnea. At this point in time her condition is to see cardiologist. Off note she did send the email message asking these questions on 09/28/2017 made a reply that she tells me that she never got  the reply.   OV 02/13/2018  Chief Complaint  Patient presents with   Follow-up    PFT done 02/05/18.  Pt states she has been doing good. Was sick in january but states she is doing better.  DME: [facility] 4L pulse    Follow-up dyspnea that is multifactorial due to obesity, physical deconditioning and diastolic dysfunction Follow-up mild interstitial lung disease not otherwise specified v concern   70 year old female with concern for interstitial lung disease.  Since her last visit she continues to do well.  She is attending pulmonary rehabilitation.  She feels better and less short of breath.  She uses oxygen with exertion at rehab saying she needs it.  She has upcoming travel in summer to [place] and wanted some flight information oxygen form filled out.  She is wondering if mesalamine that she took several years ago was the cause of interstitial lung disease or her Crohn's disease.  But overall she is stable.  Pulmonary function test shows stability and FVC since last 1 year with some improvement in DLCO.  High-resolution CT scan of the chest interpreted by thoracic radiology report suspected ILD.   OV 08/28/2018  Chief Complaint  Patient presents with   Follow-up    PFT performed today.  Pt states she has had both good and bad  days since last visit. Pt denies any complaints of cough, SOB, or CP but states she is having some problems with congestion since she has been back from vacation. Pt has been having problems with getting O2 supplies taken care of with DME.    Follow-up dyspnea that is multifactorial due to obesity, physical deconditioning and diastolic dysfunction Follow-up concern for  mild interstitial lung disease not otherwise specified  Patty [name] - Presents for follow-up for the above issues. Her dyspnea significantly better following a 30 pound weight loss and continued rehabilitation. She uses oxygen with rehabilitation. Today walking desaturation test 185 feet 3 laps on room  air: She dropped to 87% on the second lap. But she is feeling better. She had spirometry today that shows improvement in FVC concomitant with weight loss but no change in diffusion capacity that reflects the presence of ongoing possible ILD.She continues to lose more weight she believes that she could lose another 30 pounds before the next visit.     OV 03/12/2019  Subjective:  Patient ID: Patty [name], female , DOB: April 17, 1951 , age 53 y.o. , MRN: [id number] , ADDRESS: [place] [id number]   03/12/2019 -   Chief Complaint  Patient presents with   Follow-up    PFT performed today.  Pt states she has been doing good since last visit. States she still becomes SOB with exertion, and has phlegm in the mornings, postnasal drainage which has used flonase to help. Pt also uses POC as needed.    Follow-up dyspnea that is multifactorial due to obesity, physical deconditioning and diastolic dysfunction Follow-up  mild interstitial lung disease not otherwise specifieds -with stability 2018 through 2020 [non-IPF pattern]; on observation   HPI Aseneth Hack 70 y.o. -presents for follow-up.  She is attending pulmonary rehabilitation and using oxygen with exertion.  Overall she feels stable.  Subjective symptom parameters and objective parameters are documented below.  She had high-resolution CT chest that showed shows that she has ILD.  At this point we can be confident that she has mild ILD but it is stable based on symptoms walking test and pulmonary function test.  The previous autoimmune test was negative.      OV 10/07/2019  Subjective:  Patient ID: [name], female , DOB: February 17, 1951 , age 30 y.o. , MRN: [id number] , ADDRESS: [place] [id number]   10/07/2019 -   Chief Complaint  Patient presents with   Follow-up     Follow-up dyspnea that is multifactorial due to obesity, physical deconditioning and diastolic dysfunction Follow-up  mild  interstitial lung disease not otherwise specifieds -with stability 2018 through 2020 [non-IPF pattern]; on observation Follow-up mild pulmonary emphysema present on high-resolution CT chest  HPI Candance Bohlman 70 y.o. -presents for follow-up.  Prior visit was pre-pandemic.  Since then she has been sedentary at home.  She tells me that she is more short of breath.  The symptom scores below reflect that.  She thinks is because of the 30 pound weight gain because of sedentary living and social isolation following the onset of the pandemic.  She is frustrated by this.  Deep down she thinks her ILD is stable.  Pulmonary function test shows a drop in FVC but stability and DLCO suggesting weight gain is an ongoing issue for worsening dyspnea and pulmonary function test.  She is willing to have a high-dose flu shot today.  She also tells me that she is not taking her  Spiriva because it was expensive.  Although it did help her she is willing to try again and try to price it.  She also stated that her mom who never smoked had emphysema but her dad smoked heavily.  She has never been tested for alpha-1.   [place] -dropped. But tries not to use it in public    IMPRESSION: 1. Very mild scattered basilar subpleural reticulation and ground-glass similar to 01/29/2018. Nonspecific interstitial pneumonitis can not be excluded. Findings are indeterminate for UIP per consensus guidelines: Diagnosis of Idiopathic Pulmonary Fibrosis: An Official ATS/ERS/JRS/ALAT Clinical Practice Guideline. [place], Iss 5, [id number], Aug 26 2017. 2. Question cirrhosis. 3. Aortic atherosclerosis (ICD10-170.0). Coronary artery calcification. 4. Enlarged arteries, arterial hypertension. 5.  Emphysema (ICD10-J43.9).     Electronically Signed   By: Lorin Picket M.D.   On: 03/06/2019 16:59   ROS - per HPI     OV 06/23/2020  Subjective:  Patient ID: [name], female ,  DOB: February 22, 1951 , age 30 y.o. , MRN: [id number] , ADDRESS: [address] [place] [address]   06/23/2020 -   Chief Complaint  Patient presents with   Follow-up    shortness of breath with exertion   Follow-up dyspnea that is multifactorial due to obesity, physical deconditioning and diastolic dysfunction  Follow-up  mild interstitial lung disease not otherwise specifieds -with stability 2018 through 2020 [non-IPF pattern/indeterminate   -; on observation  - last CT Mach 2020 0> July 2021 wthout progression  Follow-up mild pulmonary emphysema present on high-resolution CT chest - alpha 1 MM  - o2 with exertion  - advised spiriva oct 2020 but too expensive  HPI Kanita Delage 70 y.o. -returns for routine follow-up.  She says she has been doing well.  She uses portable oxygen cranks it up to 4 L - 5 L and then walks every day for 30 minutes.  Overall she feels stable.  Her symptom score itself is stable.  However on pulmonary function testing her FVC shows a decline.  This decline started in October 2020 and since then it is stable.  She attributes this to weight gain.  There is what she told me last time as well.  However when we walked her she seemed to desaturate much more easily.  We also needed to corrected desaturation at 5 L.  She thinks it is because the portable oxygen system is not delivering oxygen currently and is a technical issue with a portable machine.  However the distance of desaturation seems to worsen.  There is no leg swelling although she has some mild varicose veins at baseline.  She is not on nighttime oxygen and she recollects attest that this some years ago and was normal back then.  Currently she is only using portable oxygen.  Her last echocardiogram was few years ago.  There is no hemoptysis or worsening cough.  She got a Covid vaccine in February 2021 and she thinks things might of changed since then.   Results for KRISTEENA, MEINEKE ANN (MRN [id number]) as of  06/23/2020 11:49  Ref. Range 05/17/2017 13:11 02/05/2018 12:14 08/28/2018 11:22 03/12/2019 14:47 10/01/2019 15:58 30 pound weight gain 06/09/2020 11:25  FVC-Pre Latest Units: L 2.61 2.57 2.80 2.73 2.38 2.42  FVC-%Pred-Pre Latest Units: % 84 83 94 91 81 82  FEV1-Pre Latest Units: L 1.83 1.71 1.96 1.83 1.65 1.53   Results for TYREISHA, UNGAR ANN (MRN [id number]) as of 06/23/2020 11:49  Ref. Range 05/17/2017 13:11 02/05/2018 12:14 08/28/2018 11:22 03/12/2019 14:47 10/01/2019 15:58 06/09/2020 11:25  DLCO unc Latest Units: ml/min/mmHg 12.64 15.15 14.53 13.77 14.39 13.48  DLCO unc % pred Latest Units: % 52 62 62 72 75 70    ROS - per HPI     OV 02/23/2021  Subjective:  Patient ID: [name], female , DOB: 1951-08-24 , age 4 y.o. , MRN: [id number] , ADDRESS: [place] [id number] PCP [name], MD Patient Care Team: [name], MD as PCP - General (Family Medicine) Pyrtle, Lajuan Lines, MD as Consulting Physician (Gastroenterology) Shamleffer, Melanie Crazier, MD as Consulting Physician (Endocrinology) Brand Males, MD as Consulting Physician (Pulmonary Disease)  This Provider for this visit: Treatment Team:  Attending Provider: Brand Males, MD   Follow-up dyspnea that is multifactorial due to obesity, physical deconditioning and diastolic dysfunction, ILD and new dx Group 3 PAH - dec 2021  45 pppd prior smoking hix  Follow-up  mild interstitial lung disease not otherwise specifieds -with stability 2018 through 2020 [non-IPF pattern/indeterminate   -; on observation  - last CT Mach 2020 - > July 2021 without progression  Inconsisent dxx of  pulmonary emphysema present on high-resolution CT chest - alpha 1 MM  - o2 with exertion  - advised spiriva oct 2020 but too expensive  Reported in only 1 CT. No mention in July 2021 CT  WHO group 3 Pulm htn with elevated PCWD - [facility] 12/07/20:   RA 13/12 mean 9 mmHg RV 48/8, EDP 12 PA 50/29 mean 38 PCWP 22/20  mean 20   O2 saturations: PA 62, Ao 97, SVC 64   CO 4.26 L/m, CI 1.96 Trans-pulmonic gradient 18 mmHg PVR 4.2 Woods Units  Grade 1 Disast dysfhn  - reportred July 2021 echo  Obesity  02/23/2021 -   Chief Complaint  Patient presents with   Follow-up    Doing ok     HPI Kiaraliz Rafuse 70 y.o. -returns for 89-monthfollow-up.  She has inconsistent diagnosis of emphysema.  Was reported in 1 scan but not another scans.  She has ILD.  She is foregoing biopsy.  She tells me overall that she is stable.  Although there are some weight gain.  She feels in the interim Metformin messed up her GI system and cause weight gain but she is glad to be out of it.  She had 6-minute walk test on 02/15/2021 at pulmonary rehabilitation.  She walked 7 and 15 feet in 6 minutes.  She did not break at all.  Her pulse ox dropped below 88% 2 times.  She corrected with 4 L of oxygen.  Her resting oxygen saturation was 96%.  And lowest was 86%.  She also had right heart catheterization in December 2021.  She has elevated pulmonary pressures but also elevated wedge.  Cardiology is following this up and she is on diuresis.  We spent some time discussing her care option.  She had pulmonary function test that shows stability in the last year and a half but declined in 2 years time.  Her high-resolution CT scan of the chest done in summer 2021 shows stability over 4 years.  She is not interested in antifibrotic's because of her colitis and also given her stability.  She does not want to go through pulmonary lung biopsy given the risks  She generally tries to avoid medications.  She is happy going to pulmonary rehabilitation.  We discussed inhaled treprostinil  as approved therapy for pulmonary hypertension -expressed to her the overall safety profile for over 20 years and approved recently for pulmonary hypertension and WHO group 3.  Explained the minimal side effect risk but she does not want to go through this  We  discussed the option of participating in a clinical trial.  We discussed what is called the PULSE inhaled nitric oxide device study.  This is a 28-week plus study.  It was a device and a nitric oxide supply study.  She is actually interested in this.  She is participating clinical trials.  She understands the voluntary nature of this.  She understands the risks that come with clinical trials.  She understands the control of risk through close follow-up and interventions..  However her 6-minute walk test is quite adequate and also she starting pulmonary rehabilitation so she would not be able to participate in this trial for several months.  In addition she is aware that inhaled treprostinil which is standard of care therapy is currently on exclusion in this protocol current amendment.  She understands if she qualifies and chooses this trial that this would be a limitation.  We have given her the consent for review but at this time she does not qualify for this trial.  HRCT July 2021   IMPRESSION: 1. Spectrum of findings suggestive of a mild basilar predominant fibrotic interstitial lung disease without appreciable interval progression since baseline 05/12/2017 high-resolution chest CT. Favor NSIP, with UIP not excluded. Findings are indeterminate for UIP per consensus guidelines: Diagnosis of Idiopathic Pulmonary Fibrosis: An Official ATS/ERS/JRS/ALAT Clinical Practice Guideline. [place], Iss 5, [phone], Aug 26 2017. 2. Dilated main pulmonary artery, stable, suggesting chronic pulmonary arterial hypertension. 3. Aortic Atherosclerosis (ICD10-I70.0).     Electronically Signed   By: Ilona Sorrel M.D.   On: 07/10/2020 15:50     OV 05/31/2021  Subjective:  Patient ID: [name], female , DOB: July 06, 1951 , age 74 y.o. , MRN: [id number] , ADDRESS: [place] [id number] PCP [name], MD Patient Care Team: [name], MD as PCP  - General (Family Medicine) Pyrtle, Lajuan Lines, MD as Consulting Physician (Gastroenterology) Shamleffer, Melanie Crazier, MD as Consulting Physician (Endocrinology) Brand Males, MD as Consulting Physician (Pulmonary Disease)  This Provider for this visit: Treatment Team:  Attending Provider: Brand Males, MD    Follow-up dyspnea that is multifactorial due to obesity, physical deconditioning and diastolic dysfunction, ILD and new dx Group 3 PAH - dec 2021  45 pppd prior smoking hix  Follow-up  mild interstitial lung disease not otherwise specifieds -with stability 2018 through 2020 [non-IPF pattern/indeterminate   -on observation  -last CT Mach 2020 - > July 2021 without progression  Inconsisent dxx of  pulmonary emphysema present on high-resolution CT chest - alpha 1 MM  - o2 with exertion  - advised spiriva oct 2020 but too expensive  Reported in only 1 CT. No mention in July 2021 CT  WHO group 3 Pulm htn with elevated PCWD - [facility] 12/07/20:   RA 13/12 mean 9 mmHg RV 48/8, EDP 12 PA 50/29 mean 38 PCWP 22/20 mean 20   O2 saturations: PA 62, Ao 97, SVC 64   CO 4.26 L/m, CI 1.96 Trans-pulmonic gradient 18 mmHg PVR 4.2 Woods Units  Grade 1 Disast dysfhn  - reportred July 2021 echo  Obesity   05/31/2021 -   Chief Complaint  Patient presents with  Follow-up    Pt states she has been doing okay since last visit. States she has begun pulmonary rehab which has helped.     HPI Deann Mclaine 70 y.o. -returns for follow-up.  She continues on supportive care with oxygen.  Room air at rest 3-5 L pulsed with oxygen.  For the last few months she is on pulsed oxygen.  She uses 2 L nasal cannula at night with her CPAP.  She is completed pulmonary rehabilitation.  For the last few months she is using pulsed oxygen.  She feels this works better.  She is attending [employer] [facility].  She does elliptical or treadmill.  She feels her symptoms have improved.  She is continue to  gain some weight though.  She again reported that she feels sensitive to medication and wants avoid antifibrotic's.  She was interested this time and discussing treprostinil versus pulsed research protocol for patients with interstitial lung disease and hypoxemic respiratory failure.  She has pulmonary hypertension based on 2021 December right heart catheterization.  We discussed the long-term safety profile of inhaled treprostinil.  Discussed the side effect profile.  After reflection about the fact the increase study showed improvement in 6-minute walk test, subgroup analysis potential modification of ILD and other composite outcome improvement.     IMPRESSION: 1. Spectrum of findings suggestive of a mild basilar predominant fibrotic interstitial lung disease without appreciable interval progression since baseline 05/12/2017 high-resolution chest CT. Favor NSIP, with UIP not excluded. Findings are indeterminate for UIP per consensus guidelines: Diagnosis of Idiopathic Pulmonary Fibrosis: An Official ATS/ERS/JRS/ALAT Clinical Practice Guideline. [place], Iss 5, [phone], Aug 26 2017. 2. Dilated main pulmonary artery, stable, suggesting chronic pulmonary arterial hypertension. 3. Aortic Atherosclerosis (ICD10-I70.0).     Electronically Signed   By: Ilona Sorrel M.D.   On: 07/10/2020 15:50   OV 09/07/2021  Subjective:  Patient ID: [name], female , DOB: June 30, 1951 , age 41 y.o. , MRN: [id number] , ADDRESS: [place] [id number] PCP [name], MD Patient Care Team: [name], MD as PCP - General (Family Medicine) Hilarie Fredrickson, Lajuan Lines, MD as Consulting Physician (Gastroenterology) [facility], Melanie Crazier, MD as Consulting Physician (Endocrinology) Brand Males, MD as Consulting Physician (Pulmonary Disease)  This Provider for this visit: Treatment Team:  Attending Provider: Brand Males, MD  Chronic  hypoxemic respiratory failure due to all of the below ILD (interstitial lung disease) [facility])  -  Decline PF March 2020 through February 2022  but stable October 2020 through February 2022 on pulmonary function test -Stable on CT scan of the chest 2018 through July 2021 -Non-IPF likely - such as NSIP -Overall supportive care of plan without antifibrotic's given history of colitis [antifibrotic's can exacerbate this situation] and prior stability -You are using room air at rest with 3-5 L with exertion -pulse oxygen for the last few months    WHO group 3 pulmonary arterial hypertension ([place]) with elevaed PCWP to 20 on Dec 2021 - started tyvaso July 2022  [place] group 3 pulmonary arterial hypertension ([place]) with elevaed PCWP to 20 on Dec 2021 - -Continue CPAP at ngith with 2L [place] at night  09/07/2021 -   Chief Complaint  Patient presents with   Follow-up    PFT performed today.  States that she still becomes SOB with exertion. Pt is now on Tyvaso and states she finds herself needing her O2 more.  HPI Saachi Zale 70 y.o. -returns for follow-up.  She started treprostinil in July 2022.  She says initially started doing better.  But once she got to 7 puffs 4 times daily she started getting more short of breath.  She felt overall that she is able to take a deep breath and feel less fatigued but when she got to that dose she said 1 time she walked across the room without oxygen which she normally walks without oxygen and she felt dizzy and winded.  Since then the symptoms have persisted.  She is also gained significant amount of weight.  She is gained 16 pounds.  Her pulmonary function test is worse with a decline in both FVC and DLCO.  She says she is now needing 6 L of nasal cannula oxygen when she exercises.  Previously 5 L nasal cannula.  Symptom score wise and walking desaturation test wise she seems the same.  She does not want to do antifibrotic's because of prior history of colectomy.   At this point she is not sure if the treprostinil is helping her or hurting her or not doing anything.  OV 10/19/2021  Subjective:  Patient ID: [name], female , DOB: [birth year] , age 14 y.o. , MRN: [id number] , ADDRESS: [place] [id number] PCP [name], MD Patient Care Team: [name], MD as PCP - General (Family Medicine) Hilarie Fredrickson, Lajuan Lines, MD as Consulting Physician (Gastroenterology) [facility], Melanie Crazier, MD as Consulting Physician (Endocrinology) Brand Males, MD as Consulting Physician (Pulmonary Disease)  This Provider for this visit: Treatment Team:  Attending Provider: Brand Males, MD   Chronic hypoxemic respiratory failure due to all of the below ILD (interstitial lung disease) ([place]) - 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 - diagnosed Sept 2022 -Non-IPF likely - such as NSIP -  Decline PF March 2020 through February 2022  but stable October 2020 through February 2022  -> declined sept 2022 PFT -Stable on CT scan of the chest 2018 through July 2021 -You are using room air at rest with 3-5 L with exertion -pulse oxygen for the last few months - started esbriet     WHO group 3 pulmonary arterial hypertension ([place]) with elevaed PCWP to 20 on Dec 2021 - started tyvaso July 2022 -> stopped sept 2022 of concern PFT declined could ave been due to tyvaso -> but restarted later in sept 2022   [place] group 3 pulmonary arterial hypertension ([place]) with elevaed PCWP to 20 on Dec 2021 - -Continue CPAP at ngith with 2L [place] at night   10/19/2021 -  followup ILD   Type of visit: Telephone/Video Circumstance: COVID-19 national emergency Identification of patient Jenniferlynn Saad with [birth year] and MRN [id number] - 2 person identifier Risks: Risks, benefits, limitations of telephone visit explained. Patient understood and verbalized agreement to proceed Anyone else on call: just patient Patient location: [phone] - her cell.   At her home This provider location: Lebam 70 y.o. - esbriet still pending. BAck on tyvaso since mid-late sept 2022. Now doing it 4x/day - 5x/each time.   Also reports sewage leak since April/May 2022 Occassional odor. Some leakage through cleanup pipe. FEels this has made her respiration worse instead of tyvaso. Initialy she thought was tyvaso but later realized might have been due to sweage leak and finally fixed in sept 2022.She feels after long term stability things got worse because of sweage leak.  She feels since leak fixed (Same time went back on tyvaso) she is better. In gym mets were down to 2.1 but now upto 2.4 mets (baseline 2.7 mets). NExt visit with mid dec 2022.   She wants to know if progression is perrmanent  Esbriet sill in process. Arriving in 2 days. She wil start at 1 pill tid x 1 week -> 2 pill tid and maintain  She can do visit 11/04/21 - 3.00pm - with [name] - video to assess followup on esbriet      SYMPTOM SCALE - ILD 03/12/2019  10/07/2019 30# wt gain  06/23/2020 250# 09/07/2021 266# - tyvaso since July 2022  O2 use RA ra    Shortness of Breath 0 -> 5 scale with 5 being worst (score 6 If unable to do)   0  At rest 0 0 0 0 with o2  Simple tasks - showers, clothes change, eating, shaving 0 0  0  Household (dishes, doing bed, laundry) 0 0 0 0  Shopping 0 0 0 0  Walking at own pace 0 4.5 4.5 3  Walking up Stairs 1-_0 Total (40 - 48) Dyspnea Score 2 9.5 9.5 6 al with o2  How bad is your cough? 0 0 0 0  How bad is your fatigue 0 1 0 2  nausea   0 0  vomit   0 0  diarrhe   0 2  axniety   3 2  depression   0 1        Simple office walk 185 feet x  3 laps goal with forehead probe 03/12/2019  10/07/2019  06/23/2020  09/07/2021   O2 used Room air Room air Room air ra  Number laps completed 3 2nd lap 1 lap 1 lap  Comments about pace slow slow slow   Resting Pulse Ox/HR 100% and 85/min 96% and 72/min 97% and 77  97% and 76  Final Pulse Ox/HR 88% and 135/min 86% and 105/min 87% and 112/min 83 and 112/min  Desaturated </= 88% yes     Desaturated <= 3% points Yes, 12     Got Tachycardic >/= 90/min yes     Symptoms at end of test Mild dyspnea  Corrected with 5L [place]. Even with 4L [place] downt to  87% Corrected with 6LNC at 94$ for antoher 2lpas  Miscellaneous comments x  ? worse     CT Chest data  No results found.    PFT  PFT Results Latest Ref Rng & Units 09/07/2021 02/22/2021 06/09/2020 10/01/2019 03/12/2019 08/28/2018 02/05/2018  FVC-Pre L 2.28 2.38 2.42 2.38 2.73 2.80 2.57  FVC-Predicted Pre % 79 81 82 81 91 94 83  FVC-Post L - - - - - - -  FVC-Predicted Post % - - - - - - -  Pre FEV1/FVC % % 62 63 63 69 67 70 67  Post FEV1/FCV % % - - - - - - -  FEV1-Pre L 1.41 1.50 1.53 1.65 1.83 1.96 1.71  FEV1-Predicted Pre % 65 68 68 74 80 86 72  FEV1-Post L - - - - - - -  DLCO uncorrected ml/min/mmHg 11.94 15.05 13.48 14.39 13.77 14.53 15.15  DLCO UNC% % 63 79 70 75 72 62 62  DLCO corrected ml/min/mmHg 11.94 27.44 12.89 - - - -  DLCO COR %Predicted % 63 144 67 - - - -  DLVA Predicted % 70 152 70 75 72 65 65  TLC L - - - - - - -  TLC % Predicted % - - - - - - -  RV % Predicted % - - - - - - -       has a past medical history of Abnormal nuclear stress test, Amyloidosis ([place]), Anxiety, Arthritis, Crohn disease ([place]), Crohn's colitis ([place]), DVT (deep venous thrombosis) ([place]), Dyspnea, Grade I diastolic dysfunction, Hyperlipidemia, ILD (interstitial lung disease) ([place]), OSA on CPAP, Pernicious anemia, Polycythemia vera ([place]), Pulmonary embolism ([place]) (2010), and Sleep disorder (01/10/2020).   reports that she quit smoking about 12 years ago. Her smoking use included cigarettes. She has a 45.00 pack-year smoking history. She has never used smokeless tobacco.  Past Surgical History:  Procedure Laterality Date    RIGHT BREAST LUMPECTOMY WITH RADIOACTIVE SEED LOCALIZATION (Right Breast)  02/04/2021   BIOPSY   09/30/2019   Procedure: BIOPSY;  Surgeon: Jerene Bears, MD;  Location: [facility];  Service: Gastroenterology;;   BREAST LUMPECTOMY WITH RADIOACTIVE SEED LOCALIZATION Right 02/04/2021   Procedure: RIGHT BREAST LUMPECTOMY WITH RADIOACTIVE SEED LOCALIZATION;  Surgeon: Coralie Keens, MD;  Location: [facility];  Service: General;  Laterality: Right;   CARDIAC CATHETERIZATION     COLON RESECTION  1993   COLONOSCOPY WITH PROPOFOL N/A 09/30/2019   Procedure: COLONOSCOPY WITH PROPOFOL;  Surgeon: Jerene Bears, MD;  Location: [facility];  Service: Gastroenterology;  Laterality: N/A;   HIP ARTHROPLASTY Right    x 2, initial right THA, then subsequent right THA revision    LEFT HEART CATH AND CORONARY ANGIOGRAPHY N/A 07/28/2017   Procedure: Left Heart Cath and Coronary Angiography;  Surgeon: Burnell Blanks, MD;  Location: [facility];  Service: Cardiovascular;  Laterality: N/A;   POLYPECTOMY  09/30/2019   Procedure: POLYPECTOMY;  Surgeon: Jerene Bears, MD;  Location: [facility];  Service: Gastroenterology;;   RIGHT HEART CATH N/A 12/07/2020   Procedure: RIGHT HEART CATH;  Surgeon: Sherren Mocha, MD;  Location: [facility];  Service: Cardiovascular;  Laterality: N/A;    Allergies  Allergen Reactions   Victoza [Liraglutide] Shortness Of Breath   Lasix [Furosemide] Itching    Hypersensitivity - pt can take as needed    Metformin And Related Diarrhea    Increased moodiness   Onion Other (See Comments)    sereve diarrhea, stomach pains (Crohn's flares)   Potassium-Containing Compounds Other (See Comments)    Due to chron's, difficult passing though kidneys not allowing absorption in the body    Prednisone Other (See Comments)    Altered mood   Sulfa Drugs Cross Reactors Other (See Comments)    "feels like bugs crawling on me"    Immunization History  Administered Date(s) Administered   Fluad Quad(high Dose 65+) 10/07/2019, 09/09/2020   Influenza, High Dose Seasonal PF  08/28/2018   Influenza-Unspecified 11/23/2017   PFIZER Comirnaty(Gray Top)Covid-19 Tri-Sucrose Vaccine 05/14/2021   PFIZER(Purple Top)SARS-COV-2 Vaccination 02/16/2020, 03/17/2020, 12/14/2020   Pneumococcal Conjugate-13 02/13/2018   Pneumococcal Polysaccharide-23 03/12/2019    Family History  Problem Relation Age of Onset   Glaucoma Mother    High blood pressure Mother    High Cholesterol Mother    Sleep apnea Mother    Diabetes Mellitus II Father    Sleep apnea Father    Anxiety disorder Father    COPD Brother    Heart disease Brother    Non-Hodgkin's lymphoma Brother    Diabetes Mellitus II Brother    Glaucoma Brother    Colon cancer Neg Hx    Esophageal cancer Neg Hx  Pancreatic cancer Neg Hx    Stomach cancer Neg Hx    Liver disease Neg Hx      Current Outpatient Medications:    amoxicillin (AMOXIL) 500 MG capsule, Take 2,000 mg by mouth See admin instructions. Before dental procedures, Disp: , Rfl:    aspirin EC 81 MG tablet, Take 81 mg by mouth daily., Disp: , Rfl:    b complex vitamins capsule, Take 1 capsule by mouth once a week., Disp: , Rfl:    Calcium Carbonate-Vit D-Min (CALCIUM 1200) 1200-1000 MG-UNIT CHEW, Chew 1 tablet by mouth daily., Disp: , Rfl:    CANNABIDIOL PO, Take 1 Dose by mouth 2 (two) times daily as needed (anxiety/sleep.). CBD Oil, Disp: , Rfl:    cetirizine (ZYRTEC) 10 MG tablet, Take 10 mg by mouth daily as needed (sinus/allergies.)., Disp: , Rfl:    cetirizine-pseudoephedrine (ZYRTEC-D) 5-120 MG tablet, Take 1 tablet by mouth daily as needed (sinus headaches.)., Disp: , Rfl:    clonazePAM (KLONOPIN) 0.5 MG tablet, TAKE 1/2 TO 1 TABLETS (0.25-0.5 MG TOTAL) BY MOUTH DAILY AS NEEDED (FOR ANXIETY OR SLEEP.)., Disp: 90 tablet, Rfl: 1   cyanocobalamin (,VITAMIN B-12,) 1000 MCG/ML injection, INJECT 1 ML (1,000 MCG TOTAL) INTO THE MUSCLE EVERY 30 (THIRTY) DAYS. INJECT 1 ML (1,000 MCG) INTO THE MUSCLE EVERY 30 DAYS, Disp: 12 mL, Rfl: 0   FLUoxetine  (PROZAC) 20 MG capsule, Take 1 capsule (20 mg total) by mouth daily., Disp: 90 capsule, Rfl: 3   fluticasone (FLONASE) 50 MCG/ACT nasal spray, Place 1-2 sprays into both nostrils daily as needed for allergies or rhinitis., Disp: , Rfl:    furosemide (LASIX) 20 MG tablet, Take 1 tablet (20 mg total) by mouth as needed for fluid., Disp: 90 tablet, Rfl: 3   Glycerin-Polysorbate 80 (REFRESH DRY EYE THERAPY OP), Place 1 drop into both eyes daily., Disp: , Rfl:    hydrocortisone (PROCTOZONE-HC) 2.5 % rectal cream, Place 1 application rectally 2 (two) times daily as needed (Crohn's flare)., Disp: 28 g, Rfl: 2   ibuprofen (ADVIL) 200 MG tablet, Take 400-800 mg by mouth every 8 (eight) hours as needed (pain.)., Disp: , Rfl:    mesalamine (PENTASA) 250 MG CR capsule, Take 250-500 mg by mouth 4 (four) times daily as needed (crohn's disease flare ups)., Disp: , Rfl:    Multiple Vitamins-Minerals (ICAPS AREDS 2 PO), Take 1 tablet by mouth daily., Disp: , Rfl:    Pirfenidone (ESBRIET) 267 MG TABS, Take 1 tab three times daily for 2 weeks then increase to 2 tabs three times daily thereafter as maintenance, Disp: 138 tablet, Rfl:    prednisoLONE acetate (PRED FORTE) 1 % ophthalmic suspension, Place 1 drop into both eyes 2 (two) times daily as needed (Uveitis)., Disp: , Rfl:    Probiotic Product (PROBIOTIC PO), Take 1 capsule by mouth 2 (two) times a week. As needed, Disp: , Rfl:    traMADol (ULTRAM) 50 MG tablet, Take 1 tablet (50 mg total) by mouth every 6 (six) hours as needed., Disp: 20 tablet, Rfl: 0   Treprostinil (TYVASO) 0.6 MG/ML SOLN, Inhale into the lungs 4 (four) times daily., Disp: , Rfl:  No current facility-administered medications for this visit.  Facility-Administered Medications Ordered in Other Visits:    6 CHG cloth bath night before surgery, , , Once **AND** 6 CHG cloth bath AM of surgery, , , Once **AND** Chlorhexidine Gluconate Cloth 2 % PADS 6 each, 6 each, Topical, Once **AND** Chlorhexidine  Gluconate Cloth 2 %  PADS 6 each, 6 each, Topical, Once, Coralie Keens, MD      Objective:   There were no vitals filed for this visit.  Estimated body mass index is 46.82 kg/m as calculated from the following:   Height as of 09/07/21: 5' 3.25" (1.607 m).   Weight as of 09/07/21: 266 lb 6.4 oz (120.8 kg).  _0 @  There were no vitals filed for this visit.   Physical Exam Sounded normal        Assessment:       ICD-10-CM   1. Chronic hypoxemic respiratory failure (HCC)  J96.11     2. ILD (interstitial lung disease) ([place])  J84.9     3. WHO group 3 pulmonary arterial hypertension (HCC)  I27.23     4. OSA on CPAP  G47.33    Z99.89          Plan:     Patient Instructions  Chronic hypoxemic respiratory failure due to all of the below ILD (interstitial lung disease) ([place]) - progressive phenotype diagnosed  WHO group 3 pulmonary arterial hypertension ([place]) with elevaed PCWP to 20 on Dec 2021   Possible progressioni in sept 2022 was due to sewage leak. Futgure course is not known but could be at risk for future progression   Glad rechallenge tyvaso is working well  Plan - continue tyvaso and slowly increase dose  - await esbriet start --Continue oxygen 3-6 L nasal cannula pulsed with exertion and at night 2 L nasal cannula and room air at rest -Continue exercise program  -However continue to keep her pulse ox at greater than or equal to 88% using a pulse oximeter -consider ILD-PRO registry (consent given sept 2022) --Make attempts to lose weight - Nov 2022 followup for esbriet (virtual visit) - dec 2022 visit with Dr Chase Caller   OSA on CPAP  Stable  plan -Continue CPAP at ngith with 2L [place] at night  per the advice of his sleep doctor - at some point can check o2 level at night   Follow-up  - Nov 2022 video with visit [name] the APP  - Dec 2022 face to face with Dr Chase Caller - 30 min slot after PFT  (Telephone visit - Level 03 visit: Estb 21-30  for this visit type which was visit type: telephone visit in total care time and counseling or/and coordination of care by this undersigned MD - Dr Brand Males. This includes one or more of the following for care delivered on 10/19/2021 same day: pre-charting, chart review, note writing, documentation discussion of test results, diagnostic or treatment recommendations, prognosis, risks and benefits of management options, instructions, education, compliance or risk-factor reduction. It excludes time spent by the [place] or office staff in the care of the patient. Actual time was 35 min. E&M code is [id number])   SIGNATURE    Dr. Brand Males, M.D., F.C.C.P,  Pulmonary and Critical Care Medicine Staff Physician, [place] Director - Interstitial Lung Disease  Program  Pulmonary [place] [facility] at [place], [place], [address]  Pager: [phone], If no answer or between  15:00h - 7:00h: call [phone] Telephone: [phone]  3:07 PM 10/19/2021

## 2021-10-19 NOTE — Patient Instructions (Addendum)
Chronic hypoxemic respiratory failure due to all of the below ILD (interstitial lung disease) ([place]) - progressive phenotype diagnosed  WHO group 3 pulmonary arterial hypertension ([place]) with elevaed PCWP to 20 on Dec 2021   Possible progressioni in sept 2022 was due to sewage leak. Futgure course is not known but could be at risk for future progression   Glad rechallenge tyvaso is working well  Plan - continue tyvaso and slowly increase dose  - await esbriet start --Continue oxygen 3-6 L nasal cannula pulsed with exertion and at night 2 L nasal cannula and room air at rest -Continue exercise program  -However continue to keep her pulse ox at greater than or equal to 88% using a pulse oximeter -consider ILD-PRO registry (consent given sept 2022) --Make attempts to lose weight - Nov 2022 followup for esbriet (virtual visit) - dec 2022 visit with Dr Chase Caller   OSA on CPAP  Stable  plan -Continue CPAP at ngith with 2L [place] at night  per the advice of his sleep doctor - at some point can check o2 level at night   Follow-up  - Nov 2022 video with visit [name] the APP  - Dec 2022 face to face with Dr Chase Caller - 30 min slot after PFT

## 2021-10-22 ENCOUNTER — Telehealth: Payer: Self-pay | Admitting: Internal Medicine

## 2021-10-22 NOTE — Telephone Encounter (Signed)
Devki, please see mychart message sent by pt and advise.

## 2021-10-22 NOTE — Telephone Encounter (Signed)
Returned call to [facility] regarding patient's Esbriet rx.  Dose is: 1 tab three times daily x 2 weeks, then increase to 2 tabs three times daily thereafter  [facility] is having phone troubles but pharmacist said they will ship out today and be out to patient tomorrow or Monday, latest  Knox Saliva, PharmD, MPH, BCPS Clinical Pharmacist (Rheumatology and Pulmonology)

## 2021-10-27 DIAGNOSIS — Z23 Encounter for immunization: Secondary | ICD-10-CM | POA: Diagnosis not present

## 2021-10-31 DIAGNOSIS — Z20828 Contact with and (suspected) exposure to other viral communicable diseases: Secondary | ICD-10-CM | POA: Diagnosis not present

## 2021-11-02 NOTE — Telephone Encounter (Signed)
MR please advise. Thanks   [facility] I am on my 5th day of Esbriet. I have had a few side effects such as headache, increased fatigue, itchy skin, some joint pain, and some symptoms that may not be related. There is one side effect that indicates that I should advise my doctor.  I have had a noticeable dull pain in my upper right side near the bottom of my rib cage since the 2nd day. It seems to have increased a bit today. When I put pressure on the area, it increases the pain.  My urine is clear and no jaundice.  Just wanted to let you know.

## 2021-11-03 NOTE — Telephone Encounter (Signed)
The pain might not be from esbriet unless it persists or cramps rest of abdomen. Rest of side effecs - esp HA, fatigues are esberit. Since all together can be esbriet. If bad -> can stop

## 2021-11-04 ENCOUNTER — Telehealth: Payer: Medicare Other | Admitting: Adult Health

## 2021-11-11 ENCOUNTER — Encounter: Payer: Self-pay | Admitting: Adult Health

## 2021-11-11 ENCOUNTER — Telehealth (INDEPENDENT_AMBULATORY_CARE_PROVIDER_SITE_OTHER): Payer: Medicare Other | Admitting: Adult Health

## 2021-11-11 ENCOUNTER — Other Ambulatory Visit: Payer: Self-pay

## 2021-11-11 DIAGNOSIS — Z9989 Dependence on other enabling machines and devices: Secondary | ICD-10-CM

## 2021-11-11 DIAGNOSIS — G4733 Obstructive sleep apnea (adult) (pediatric): Secondary | ICD-10-CM | POA: Diagnosis not present

## 2021-11-11 DIAGNOSIS — J849 Interstitial pulmonary disease, unspecified: Secondary | ICD-10-CM | POA: Diagnosis not present

## 2021-11-11 DIAGNOSIS — I2723 Pulmonary hypertension due to lung diseases and hypoxia: Secondary | ICD-10-CM

## 2021-11-11 DIAGNOSIS — J9611 Chronic respiratory failure with hypoxia: Secondary | ICD-10-CM

## 2021-11-11 NOTE — Progress Notes (Signed)
Virtual Visit via Video Note  I connected with Patty [name] on 11/11/21 at  4:30 PM EST by a video enabled telemedicine application and verified that I am speaking with the correct person using two identifiers.  Location: Patient: Home  Provider: Office    I discussed the limitations of evaluation and management by telemedicine and the availability of in person appointments. The patient expressed understanding and agreed to proceed.  History of Present Illness: 70 yo female former smoker followed for ILD and Emphysema  Medical history significant for Crohn's Disease and DCHF   Today's video visit is a 1 month follow up for ILD . She has recently started on Esbriet 2 weeks ago, does notice some symptoms of fatigue , joint aches and fatigue. Started on Esbriet 267 mg 2 tab Twice daily, this week. No v/d. Some nausea . No bloody stools. Some intermittent pain under right ribs on/off. No urinary issues. No syncope.  Remains on Tyvaso Four times a day   Remains on Oxygen 6l/m with activity and 2l/m At bedtime  . None at rest  No change in O2 requirements.   Past Medical History:  Diagnosis Date   Abnormal nuclear stress test    a. 07/2017: cath with no CAD   Amyloidosis (HCC)    Anxiety    Arthritis    Crohn disease ([place])    Crohn's colitis ([place])    DVT (deep venous thrombosis) (HCC)    Dyspnea    Grade I diastolic dysfunction    Hyperlipidemia    ILD (interstitial lung disease) (HCC)    OSA on CPAP    CPAP    AND O2   Pernicious anemia    Polycythemia vera ([place])    Pulmonary embolism ([place]) 2010   s/p hip surgery , reports this was never truly confirmed    Sleep disorder 01/10/2020   Uses klonopin for sleep since 2014; sleep walking. And anxiety   Current Outpatient Medications on File Prior to Visit  Medication Sig Dispense Refill   aspirin EC 81 MG tablet Take 81 mg by mouth daily.     b complex vitamins capsule Take 1 capsule by mouth once a week.     Calcium  Carbonate-Vit D-Min (CALCIUM 1200) 1200-1000 MG-UNIT CHEW Chew 1 tablet by mouth daily.     CANNABIDIOL PO Take 1 Dose by mouth 2 (two) times daily as needed (anxiety/sleep.). CBD Oil     cetirizine (ZYRTEC) 10 MG tablet Take 10 mg by mouth daily as needed (sinus/allergies.).     cetirizine-pseudoephedrine (ZYRTEC-D) 5-120 MG tablet Take 1 tablet by mouth daily as needed (sinus headaches.).     clonazePAM (KLONOPIN) 0.5 MG tablet TAKE 1/2 TO 1 TABLETS (0.25-0.5 MG TOTAL) BY MOUTH DAILY AS NEEDED (FOR ANXIETY OR SLEEP.). 90 tablet 1   cyanocobalamin (,VITAMIN B-12,) 1000 MCG/ML injection INJECT 1 ML (1,000 MCG TOTAL) INTO THE MUSCLE EVERY 30 (THIRTY) DAYS. INJECT 1 ML (1,000 MCG) INTO THE MUSCLE EVERY 30 DAYS 12 mL 0   FLUoxetine (PROZAC) 20 MG capsule Take 1 capsule (20 mg total) by mouth daily. 90 capsule 3   fluticasone (FLONASE) 50 MCG/ACT nasal spray Place 1-2 sprays into both nostrils daily as needed for allergies or rhinitis.     Glycerin-Polysorbate 80 (REFRESH DRY EYE THERAPY OP) Place 1 drop into both eyes daily.     hydrocortisone (PROCTOZONE-HC) 2.5 % rectal cream Place 1 application rectally 2 (two) times daily as needed (Crohn's flare). 28 g 2  ibuprofen (ADVIL) 200 MG tablet Take 400-800 mg by mouth every 8 (eight) hours as needed (pain.).     mesalamine (PENTASA) 250 MG CR capsule Take 250-500 mg by mouth 4 (four) times daily as needed (crohn's disease flare ups).     Multiple Vitamins-Minerals (ICAPS AREDS 2 PO) Take 1 tablet by mouth daily.     Pirfenidone (ESBRIET) 267 MG TABS Take 1 tab three times daily for 2 weeks then increase to 2 tabs three times daily thereafter as maintenance 138 tablet    prednisoLONE acetate (PRED FORTE) 1 % ophthalmic suspension Place 1 drop into both eyes 2 (two) times daily as needed (Uveitis).     Probiotic Product (PROBIOTIC PO) Take 1 capsule by mouth 2 (two) times a week. As needed     traMADol (ULTRAM) 50 MG tablet Take 1 tablet (50 mg total) by  mouth every 6 (six) hours as needed. 20 tablet 0   Treprostinil (TYVASO) 0.6 MG/ML SOLN Inhale into the lungs 4 (four) times daily.     amoxicillin (AMOXIL) 500 MG capsule Take 2,000 mg by mouth See admin instructions. Before dental procedures (Patient not taking: Reported on 11/11/2021)     furosemide (LASIX) 20 MG tablet Take 1 tablet (20 mg total) by mouth as needed for fluid. 90 tablet 3   Current Facility-Administered Medications on File Prior to Visit  Medication Dose Route Frequency Provider Last Rate Last Admin   Chlorhexidine Gluconate Cloth 2 % PADS 6 each  6 each Topical Once Coralie Keens, MD       And   Chlorhexidine Gluconate Cloth 2 % PADS 6 each  6 each Topical Once Coralie Keens, MD          Observations/Objective: 07/10/20 HRCT- Spectrum of findings suggestive of a mild basilar predominant fibrotic interstitial lung disease without appreciable interval progression since baseline 05/12/2017 high-resolution chest CT. Favor NSIP, with UIP not excluded. Dilated main pulmonary artery, stable, suggesting chronic pulmonary arterial hypertension. Aortic Atherosclerosis (ICD10-I70.0).   Cardiac testing:  Echocardiogram- EF 60-65%, moderate left ventricular hypertrophy, grade 1 diastolic dysfunction    Pulmonary testing: 06/23/20 Overnight oximetry - Spent 30 mins with O2 <88% while on CPAP  Pulmonary function testing in October 2020 showed FEV1 at 74%, ratio 69, FVC 81%, DLCO 75%.  June PFTs were repeated with an FEV1 at 82%, ratio 63, FVC 82%.  DLCO was at 70%. Patient was referred to cardiology 2D echo July showed EF at 0017%, grade 1 diastolic dysfunction.  Right ventricle normal stress Lexi scan showed normal with low risk.  No sign of ischemia with normal pump function.  Patient underwent a right heart cath that showed moderate pulmonary hypertension with PA pressure 50/29, mean 38 mmHg.  Assessment and Plan: ILD -Progressive - continue on Esbriet , advised on  helpful hints to tolerate meds  Check LFT on return   Pulmonary HTN -continue on O2 and Tyvaso   O2 RF - O2 to keep sats >88-90%.   OSA cont on CPAP   Plan  Patient Instructions  Continue on Esbriet .  Continue on Tyvaso .  Continue on Oxygen 6/m with activity .-continuous flow during exercise/ heavy walking . Goal is to have O2 sats >88-90%.  Continue on CPAP At bedtime with Oxygen 2l/m  Low salt diet .  Work on healthy weight loss .  Follow up Dr. Chase Caller in 4  week with labs and As needed   Please contact office for sooner follow up if symptoms  do not improve or worsen or seek emergency care  '   Follow Up Instructions:    I discussed the assessment and treatment plan with the patient. The patient was provided an opportunity to ask questions and all were answered. The patient agreed with the plan and demonstrated an understanding of the instructions.   The patient was advised to call back or seek an in-person evaluation if the symptoms worsen or if the condition fails to improve as anticipated.  I provided 22  minutes of non-face-to-face time during this encounter.   Rexene Edison, NP

## 2021-11-11 NOTE — Patient Instructions (Signed)
Continue on Esbriet .  Continue on Tyvaso .  Continue on Oxygen 6/m with activity .-continuous flow during exercise/ heavy walking . Goal is to have O2 sats >88-90%.  Continue on CPAP At bedtime with Oxygen 2l/m  Low salt diet .  Work on healthy weight loss .  Follow up Dr. Chase Caller in 4  week with labs and As needed   Please contact office for sooner follow up if symptoms do not improve or worsen or seek emergency care

## 2021-11-27 DIAGNOSIS — Z23 Encounter for immunization: Secondary | ICD-10-CM | POA: Diagnosis not present

## 2021-11-29 ENCOUNTER — Encounter: Payer: Self-pay | Admitting: Physician Assistant

## 2021-11-29 NOTE — Progress Notes (Addendum)
Cardiology Office Note    Date:  11/30/2021   ID:  Patty [name], DOB 01-26-1951, MRN [id number]  PCP:  [name], MD  Cardiologist:  Jenkins Rouge, MD  Electrophysiologist:  None   Chief Complaint: f/u dyspnea on exertion  History of Present Illness:   Patty [name] is a 70 y.o. female with history of ILD on home O2 with activity followed by pulmonology, amyloidosis, anxiety, arthritis, pulmonary HTN, chronic diastolic CHF, Crohn's disease, HLD (managed by primary care), polycycthema vera, pre-DM, pernicious anemia, OSA who is seen for follow-up.  She has a prior history of dyspnea on exertion. She had a false positive stress test in 2018 followed by cath showing no evidence of CAD. In more recent years she was seen by pulmonology and was found to have ILD and OSA. Pulmonology recommended ischemic eval to rule out CAD. Echo 06/2020 showed EF 60-65%, moderate LVH, grade 1 DD, visually RVF appeared reduced. She then underwent a Myoview stress test 10/22/20 which showed no ischemia or infarct. She historically did not tolerate taking Lasix daily due to her Crohn's disease. She reports this was somewhat related to difficulty taking potassium that was required at the time. Pulmonology requested RHC to assess PA pressures for Tyvaso consideration. This was done 11/2020 showing moderate pulmonary HTN. RA 13/12 mean 9 mmHg RV 48/8, EDP 12 PA 50/29 mean 38 PCWP 22/20 mean 20   O2 saturations: PA 62, Ao 97, SVC 64   CO 4.26 L/m, CI 1.96 Trans-pulmonic gradient 18 mmHg PVR 4.2 Woods Units  She is seen back for follow-up today. She has since been started on Esbriet and Tyvaso by pulmonology. She feels she is still getting used to these medicines. She has follow-up next week with Dr. Chase Caller. She feels like her breathing has been getting worse over the past few weeks and it takes her longer to recover. She has to use 6L with ambulation and 2L at night. She uses Lasix infrequently,  typically triggered by increase in ankle edema or worsening dyspnea. Taking it does not usually improve her dyspnea though. She does not like to take it more frequently due to her Crohn's disease. 2-3 weeks ago she had a fleeting episode of chest discomfort associated with a funny heartbeat that resolved spontaneously. She has not had any more typical angina. Of note, amyloidosis was added to her PMH by an LPN in 3614. I cannot find any records that corroborate this and the patient does not recall ever being diagnosed with this. She is frustrated because she continues to gain weight because she has been on a diet for years. She clarifies she is able to exercise by walking/going to the gym 3x a week but did not tolerate adding weights to her ankles or doing weight-related exercise for her glutes and thighs. She also did not tolerate additional medications from the [facility] as they were too harsh on her GI system.  Labwork independently reviewed: 09/2021 K 4.1, Cr 0.79, AST/ALT OK, CBC wnl 01/2021 TSH wnl, A1c 6.4, LDL 120   Past Medical History:  Diagnosis Date   Abnormal nuclear stress test    a. 07/2017: cath with no CAD   Amyloidosis (HCC)    Anxiety    Arthritis    Chronic diastolic CHF (congestive heart failure) (HCC)    Crohn disease (HCC)    DVT (deep venous thrombosis) (HCC)    Dyspnea    Grade I diastolic dysfunction    Hyperlipidemia  ILD (interstitial lung disease) (HCC)    OSA on CPAP    CPAP    AND O2   Pernicious anemia    Polycythemia vera ([place])    Pulmonary embolism ([place]) 2010   s/p hip surgery , reports this was never truly confirmed    Sleep disorder 01/10/2020   Uses klonopin for sleep since 2014; sleep walking. And anxiety    Past Surgical History:  Procedure Laterality Date    RIGHT BREAST LUMPECTOMY WITH RADIOACTIVE SEED LOCALIZATION (Right Breast)  02/04/2021   BIOPSY  09/30/2019   Procedure: BIOPSY;  Surgeon: Jerene Bears, MD;  Location: [facility];  Service:  Gastroenterology;;   BREAST LUMPECTOMY WITH RADIOACTIVE SEED LOCALIZATION Right 02/04/2021   Procedure: RIGHT BREAST LUMPECTOMY WITH RADIOACTIVE SEED LOCALIZATION;  Surgeon: Coralie Keens, MD;  Location: [facility];  Service: General;  Laterality: Right;   CARDIAC CATHETERIZATION     COLON RESECTION  1993   COLONOSCOPY WITH PROPOFOL N/A 09/30/2019   Procedure: COLONOSCOPY WITH PROPOFOL;  Surgeon: Jerene Bears, MD;  Location: [facility];  Service: Gastroenterology;  Laterality: N/A;   HIP ARTHROPLASTY Right    x 2, initial right THA, then subsequent right THA revision    LEFT HEART CATH AND CORONARY ANGIOGRAPHY N/A 07/28/2017   Procedure: Left Heart Cath and Coronary Angiography;  Surgeon: Burnell Blanks, MD;  Location: [facility];  Service: Cardiovascular;  Laterality: N/A;   POLYPECTOMY  09/30/2019   Procedure: POLYPECTOMY;  Surgeon: Jerene Bears, MD;  Location: [facility];  Service: Gastroenterology;;   RIGHT HEART CATH N/A 12/07/2020   Procedure: RIGHT HEART CATH;  Surgeon: Sherren Mocha, MD;  Location: [facility];  Service: Cardiovascular;  Laterality: N/A;    Current Medications: Current Meds  Medication Sig   amoxicillin (AMOXIL) 500 MG capsule Take 2,000 mg by mouth See admin instructions. Before dental procedures   aspirin EC 81 MG tablet Take 81 mg by mouth daily.   b complex vitamins capsule Take 1 capsule by mouth once a week.   Calcium Carbonate-Vit D-Min (CALCIUM 1200) 1200-1000 MG-UNIT CHEW Chew 1 tablet by mouth daily.   CANNABIDIOL PO Take 1 Dose by mouth 2 (two) times daily as needed (anxiety/sleep.). CBD Oil   cetirizine (ZYRTEC) 10 MG tablet Take 10 mg by mouth daily as needed (sinus/allergies.).   cetirizine-pseudoephedrine (ZYRTEC-D) 5-120 MG tablet Take 1 tablet by mouth daily as needed (sinus headaches.).   clonazePAM (KLONOPIN) 0.5 MG tablet TAKE 1/2 TO 1 TABLETS (0.25-0.5 MG TOTAL) BY MOUTH DAILY AS NEEDED (FOR ANXIETY OR SLEEP.).    cyanocobalamin (,VITAMIN B-12,) 1000 MCG/ML injection INJECT 1 ML (1,000 MCG TOTAL) INTO THE MUSCLE EVERY 30 (THIRTY) DAYS. INJECT 1 ML (1,000 MCG) INTO THE MUSCLE EVERY 30 DAYS   FLUoxetine (PROZAC) 20 MG capsule Take 1 capsule (20 mg total) by mouth daily.   fluticasone (FLONASE) 50 MCG/ACT nasal spray Place 1-2 sprays into both nostrils daily as needed for allergies or rhinitis.   Glycerin-Polysorbate 80 (REFRESH DRY EYE THERAPY OP) Place 1 drop into both eyes daily.   hydrocortisone (PROCTOZONE-HC) 2.5 % rectal cream Place 1 application rectally 2 (two) times daily as needed (Crohn's flare).   ibuprofen (ADVIL) 200 MG tablet Take 400-800 mg by mouth every 8 (eight) hours as needed (pain.).   mesalamine (PENTASA) 250 MG CR capsule Take 250-500 mg by mouth 4 (four) times daily as needed (crohn's disease flare ups).   Multiple Vitamins-Minerals (ICAPS AREDS 2 PO)  Take 1 tablet by mouth daily.   Pirfenidone (ESBRIET) 267 MG TABS Take 1 tab three times daily for 2 weeks then increase to 2 tabs three times daily thereafter as maintenance   prednisoLONE acetate (PRED FORTE) 1 % ophthalmic suspension Place 1 drop into both eyes 2 (two) times daily as needed (Uveitis).   Probiotic Product (PROBIOTIC PO) Take 1 capsule by mouth 2 (two) times a week. As needed   traMADol (ULTRAM) 50 MG tablet Take 1 tablet (50 mg total) by mouth every 6 (six) hours as needed.   Treprostinil (TYVASO) 0.6 MG/ML SOLN Inhale into the lungs 4 (four) times daily.      Allergies:   Victoza [liraglutide], Lasix [furosemide], Metformin and related, Onion, Potassium-containing compounds, Prednisone, and Sulfa drugs cross reactors   Social History   Socioeconomic History   Marital status: Single    Spouse name: Not on file   Number of children: 0   Years of education: Not on file   Highest education level: Not on file  Occupational History   Occupation: retired  Tobacco Use   Smoking status: Former    Packs/day: 1.00     Years: 45.00    Pack years: 45.00    Types: Cigarettes    Quit date: 12/26/2008    Years since quitting: 12.9   Smokeless tobacco: Never  Vaping Use   Vaping Use: Never used  Substance and Sexual Activity   Alcohol use: Yes    Comment: occ   Drug use: No   Sexual activity: Not Currently  Other Topics Concern   Not on file  Social History Narrative   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    Social Determinants of Health   Financial Resource Strain: Low Risk    Difficulty of Paying Living Expenses: Not hard at all  Food Insecurity: No Food Insecurity   Worried About Charity fundraiser in the Last Year: Never true   [place] in the Last Year: Never true  Transportation Needs: No Transportation Needs   Lack of Transportation (Medical): No   Lack of Transportation (Non-Medical): No  Physical Activity: Insufficiently Active   Days of Exercise per Week: 4 days   Minutes of Exercise per Session: 30 min  Stress: Stress Concern Present   Feeling of Stress : To some extent  Social Connections: Socially Isolated   Frequency of Communication with Friends and Family: Twice a week   Frequency of Social Gatherings with Friends and Family: Never   Attends Religious Services: 1 to 4 times per year   Active Member of [employer] or Organizations: No   Attends Music therapist: Never   Marital Status: Never married     Family History:  The patient's family history includes Anxiety disorder in her father; COPD in her brother; Diabetes Mellitus II in her brother and father; Glaucoma in her brother and mother; Heart disease in her brother; High Cholesterol in her mother; High blood pressure in her mother; Non-Hodgkin's lymphoma in her brother; Sleep apnea in her father and mother. There is no history of Colon cancer, Esophageal cancer, Pancreatic cancer, Stomach cancer, or Liver disease.  ROS:   Please see the history of present illness.   All other systems are reviewed and otherwise negative.    EKGs/Labs/Other Studies Reviewed:    Studies reviewed are outlined and summarized above. Reports included below if pertinent.  [place] 11/2020 Hemodynamic findings consistent  with moderate pulmonary hypertension.   RA 13/12 mean 9 mmHg RV 48/8, EDP 12 PA 50/29 mean 38 PCWP 22/20 mean 20   O2 saturations: PA 62, Ao 97, SVC 64   CO 4.26 L/m, CI 1.96 Trans-pulmonic gradient 18 mmHg PVR 4.2 Woods Units  2D Echo 06/2020  1. Left ventricular ejection fraction, by estimation, is 60 to 65%. The  left ventricle has normal function. The left ventricle has no regional  wall motion abnormalities. There is moderate concentric left ventricular  hypertrophy. Left ventricular  diastolic parameters are consistent with Grade I diastolic dysfunction  (impaired relaxation).   2. Although TAPSE is normal, visually RVF appears reduced. The right  ventricular size is normal.   3. The mitral valve is normal in structure. No evidence of mitral valve  regurgitation. No evidence of mitral stenosis.   4. The aortic valve is normal in structure. Aortic valve regurgitation is  not visualized. No aortic stenosis is present.   5. The inferior vena cava is normal in size with greater than 50%  respiratory variability, suggesting right atrial pressure of 3 mmHg.   High Res CT 06/2020 IMPRESSION: 1. Spectrum of findings suggestive of a mild basilar predominant fibrotic interstitial lung disease without appreciable interval progression since baseline 05/12/2017 high-resolution chest CT. Favor NSIP, with UIP not excluded. Findings are indeterminate for UIP per consensus guidelines: Diagnosis of Idiopathic Pulmonary Fibrosis: An Official ATS/ERS/JRS/ALAT Clinical Practice Guideline. [place], Iss 5, [phone], Aug 26 2017. 2. Dilated main pulmonary artery, stable, suggesting chronic pulmonary arterial hypertension. 3. Aortic  Atherosclerosis (ICD10-I70.0).     Cath 2018 1. No angiographic evidence of CAD 2. Elevated filling pressures   Recommendations: No further ischemic workup. Her filling pressures are elevated. She may benefit from diuretic therapy if she becomes symptomatic.     EKG:  EKG is ordered today, personally reviewed, demonstrating NSR 93bpm, left axis deviation and Q waves III, avF, with nonspecific TW changes I, avL, similar to prior. (See 2018 tracing.)  Recent Labs: 02/01/2021: TSH 0.45 10/07/2021: ALT 17; BUN 18; Creatinine, Ser 0.79; Hemoglobin 13.8; Platelets 310.0; Potassium 4.1; Sodium 138  Recent Lipid Panel    Component Value Date/Time   CHOL 190 02/01/2021 1431   CHOL 178 01/21/2019 1321   TRIG 75.0 02/01/2021 1431   HDL 55.60 02/01/2021 1431   HDL 61 01/21/2019 1321   CHOLHDL 3 02/01/2021 1431   VLDL 15.0 02/01/2021 1431   LDLCALC 120 (H) 02/01/2021 1431   LDLCALC 102 (H) 01/21/2019 1321    PHYSICAL EXAM:    VS:  BP 140/90 (BP Location: Left Arm, Patient Position: Sitting, Cuff Size: Normal)   Pulse 93   Ht _0  (1.6 m)   Wt 275 lb (124.7 kg)   SpO2 92%   BMI 48.71 kg/m   BMI: Body mass index is 48.71 kg/m.  GEN: Well nourished, well developed female in no acute distress HEENT: normocephalic, atraumatic Neck: no JVD, carotid bruits, or masses Cardiac: RRR; no murmurs, rubs, or gallops, no edema  Respiratory:  clear to auscultation bilaterally, normal work of breathing GI: soft, nontender, nondistended, + BS MS: no deformity or atrophy Skin: warm and dry, no rash Neuro:  Alert and Oriented x 3, Strength and sensation are intact, follows commands Psych: euthymic mood, full affect  Wt Readings from Last 3 Encounters:  11/30/21 275 lb (124.7 kg)  09/07/21 266 lb 6.4 oz (120.8 kg)  05/31/21 259 lb 12.8 oz (  117.8 kg)     ASSESSMENT & PLAN:   1. Dyspnea on exertion - longstanding issue with progression over this past year, suspect multifactorial. She also had  brief fleeting focal chest pain 2-3 weeks ago with a sensation of funny heartbeat that resolved. This has not recurred and she has not had any chest pain with exertion but is encouraged to notify for any recurrent symptoms. I reviewed clinical scenario with Dr. Johnsie Cancel. He feels that prior RHC findings were suggestive moreso of underlying pulmonary disease since LVEDP was only 12. At this time, symptoms are felt related primarily to underlying pulmonary disease in setting of known ILD, pulmonary HTN, morbid obesity and deconditioning. Dr. Johnsie Cancel would suggest that she try to use her standing Lasix more regularly to help regulate filling pressures. She is amenable to trying this. We will check her CBC, BMET, BNP today. I encouraged her to reach out to pulmonology about worsening dyspnea and to keep f/u with Dr. Chase Caller next week - we will await his input whether he feels it would be beneficial for the patient to also simultaneously follow with our AHF team for formal evaluation of pulmonary HTN with Dr. Haroldine Laws or Aundra Dubin.  2. Moderate pulmonary HTN in setting of ILD - encouraged f/u with Dr. Chase Caller as planned.  3. Chronic diastolic CHF - encouraged to use Lasix daily. Check labs as above.  4. Hyperlipidemia - this is managed by primary care. Patient was previously hesitant to use statin per their notes.  5. Weight gain - check TSH with labs. Otherwise does not have significant signs of volume overload on exam. No significant exam, no crackles in lungs or distended abdomen. Unfortunately she reports she previously did not see improvement with management with healthy weight and wellness clinic for reasons above.  6. Elevated BP without diagnosis of HTN - she reports home BPs 120/80. This is corroborated in the chart trends as well. She thinks this was unusual due to rushing for today's OV. She will continue to monitor at home.     Disposition: F/u with Dr. Johnsie Cancel in 6 months.   Medication  Adjustments/Labs and Tests Ordered: Current medicines are reviewed at length with the patient today.  Concerns regarding medicines are outlined above. Medication changes, Labs and Tests ordered today are summarized above and listed in the Patient Instructions accessible in Encounters.   Signed, Charlie Pitter, PA-C  11/30/2021 2:10 PM    [facility] Phone: [phone]; Fax: [phone]

## 2021-11-30 ENCOUNTER — Encounter: Payer: Self-pay | Admitting: Physician Assistant

## 2021-11-30 ENCOUNTER — Ambulatory Visit (INDEPENDENT_AMBULATORY_CARE_PROVIDER_SITE_OTHER): Payer: Medicare Other | Admitting: Physician Assistant

## 2021-11-30 ENCOUNTER — Other Ambulatory Visit: Payer: Self-pay

## 2021-11-30 VITALS — BP 140/90 | HR 93 | Ht 63.0 in | Wt 275.0 lb

## 2021-11-30 DIAGNOSIS — I5032 Chronic diastolic (congestive) heart failure: Secondary | ICD-10-CM | POA: Diagnosis not present

## 2021-11-30 DIAGNOSIS — R030 Elevated blood-pressure reading, without diagnosis of hypertension: Secondary | ICD-10-CM

## 2021-11-30 DIAGNOSIS — R0609 Other forms of dyspnea: Secondary | ICD-10-CM | POA: Diagnosis not present

## 2021-11-30 DIAGNOSIS — R635 Abnormal weight gain: Secondary | ICD-10-CM

## 2021-11-30 DIAGNOSIS — R7989 Other specified abnormal findings of blood chemistry: Secondary | ICD-10-CM

## 2021-11-30 DIAGNOSIS — I272 Pulmonary hypertension, unspecified: Secondary | ICD-10-CM | POA: Diagnosis not present

## 2021-11-30 DIAGNOSIS — E785 Hyperlipidemia, unspecified: Secondary | ICD-10-CM

## 2021-11-30 NOTE — Telephone Encounter (Signed)
Received the following message from patient:   "Hi Dr Purnell Shoemaker - Since I have been taking the Esberit (now 2/3x) it seems my breathing issues have worsened - my urine output is as frequent but lower output; flatulence with bloated feeling as nausea has lessened; and even though I have made increased effort to lose weight, continue to walk or go to the gym 3 or more times a week, I have gained 10 more pounds (sigh).  I would like to halt the Esberit starting today and increase the Tyvaso (now at 5 breaths/4x). We can see how I do before my appointments next week if that is okay with you. Thank you, Patty [name]"  MR, can you please advise? Thanks!

## 2021-11-30 NOTE — Patient Instructions (Addendum)
Medication Instructions:  Your physician recommends that you continue on your current medications as directed. Please refer to the Current Medication list given to you today.  *If you need a refill on your cardiac medications before your next appointment, please call your pharmacy*   Lab Work: TODAY:  BMET, CBC, & PRO BNP  If you have labs (blood work) drawn today and your tests are completely normal, you will receive your results only by: [place] (if you have MyChart) OR A paper copy in the mail If you have any lab test that is abnormal or we need to change your treatment, we will call you to review the results.   Testing/Procedures: None ordered   Follow-Up: At [facility], you and your health needs are our priority.  As part of our continuing mission to provide you with exceptional heart care, we have created designated Provider Care Teams.  These Care Teams include your primary Cardiologist (physician) and Advanced Practice Providers (APPs -  Physician Assistants and Nurse Practitioners) who all work together to provide you with the care you need, when you need it.  We recommend signing up for the patient portal called "MyChart".  Sign up information is provided on this After Visit Summary.  MyChart is used to connect with patients for Virtual Visits (Telemedicine).  Patients are able to view lab/test results, encounter notes, upcoming appointments, etc.  Non-urgent messages can be sent to your provider as well.   To learn more about what you can do with MyChart, go to [email or web address].    Your next appointment:   6 month(s)  The format for your next appointment:   In Person  Provider:   Jenkins Rouge, MD  or Melina Copa, PA-C         Other Instructions

## 2021-12-01 LAB — PRO B NATRIURETIC PEPTIDE: NT-Pro BNP: 56 pg/mL (ref 0–301)

## 2021-12-01 LAB — BASIC METABOLIC PANEL
BUN/Creatinine Ratio: 27 (ref 12–28)
BUN: 21 mg/dL (ref 8–27)
CO2: 25 mmol/L (ref 20–29)
Calcium: 9.5 mg/dL (ref 8.7–10.3)
Chloride: 102 mmol/L (ref 96–106)
Creatinine, Ser: 0.79 mg/dL (ref 0.57–1.00)
Glucose: 99 mg/dL (ref 70–99)
Potassium: 4.3 mmol/L (ref 3.5–5.2)
Sodium: 141 mmol/L (ref 134–144)
eGFR: 80 mL/min/{1.73_m2} (ref >59)

## 2021-12-01 LAB — CBC
Hematocrit: 43.3 % (ref 34.0–46.6)
Hemoglobin: 14.5 g/dL (ref 11.1–15.9)
MCH: 29.7 pg (ref 26.6–33.0)
MCHC: 33.5 g/dL (ref 31.5–35.7)
MCV: 89 fL (ref 79–97)
Platelets: 329 10*3/uL (ref 150–450)
RBC: 4.88 x10E6/uL (ref 3.77–5.28)
RDW: 14.2 % (ref 11.7–15.4)
WBC: 7.5 10*3/uL (ref 3.4–10.8)

## 2021-12-01 LAB — TSH: TSH: 0.43 u[IU]/mL — ABNORMAL LOW (ref 0.450–4.500)

## 2021-12-02 NOTE — Telephone Encounter (Signed)
Sorry for delay  Improved flatulence and bloating - usually wth esbriet is oppoiste but duly noted 2. Other issues f ur op getting worse and dyspnea - might not be due to esbritet but seems time correlated due to her hx  Therefore ok to support her plan of stopping esbriet and then seeing what is going on

## 2021-12-03 NOTE — Telephone Encounter (Signed)
Please place order for TSH and free T4 and notify pt

## 2021-12-03 NOTE — Addendum Note (Signed)
Addended by: Aris Georgia, Shana Younge L on: 12/03/2021 08:12 AM   Modules accepted: Orders

## 2021-12-06 ENCOUNTER — Other Ambulatory Visit: Payer: Self-pay

## 2021-12-06 ENCOUNTER — Ambulatory Visit (INDEPENDENT_AMBULATORY_CARE_PROVIDER_SITE_OTHER): Payer: Medicare Other | Admitting: Internal Medicine

## 2021-12-06 DIAGNOSIS — J849 Interstitial pulmonary disease, unspecified: Secondary | ICD-10-CM

## 2021-12-06 LAB — PULMONARY FUNCTION TEST
DL/VA % pred: 66 %
DL/VA: 2.77 ml/min/mmHg/L
DLCO cor % pred: 60 %
DLCO cor: 11.45 ml/min/mmHg
DLCO unc % pred: 62 %
DLCO unc: 11.82 ml/min/mmHg
FEF 25-75 Pre: 0.67 L/sec
FEF2575-%Pred-Pre: 36 %
FEV1-%Pred-Pre: 63 %
FEV1-Pre: 1.38 L
FEV1FVC-%Pred-Pre: 78 %
FEV6-%Pred-Pre: 82 %
FEV6-Pre: 2.27 L
FEV6FVC-%Pred-Pre: 102 %
FVC-%Pred-Pre: 80 %
FVC-Pre: 2.32 L
Pre FEV1/FVC ratio: 60 %
Pre FEV6/FVC Ratio: 98 %

## 2021-12-06 NOTE — Progress Notes (Signed)
Spirometry and Dlco done today. 

## 2021-12-07 ENCOUNTER — Ambulatory Visit (INDEPENDENT_AMBULATORY_CARE_PROVIDER_SITE_OTHER): Payer: Medicare Other | Admitting: Internal Medicine

## 2021-12-07 ENCOUNTER — Encounter: Payer: Self-pay | Admitting: Internal Medicine

## 2021-12-07 ENCOUNTER — Telehealth: Payer: Self-pay | Admitting: Family Medicine

## 2021-12-07 VITALS — BP 134/82 | HR 80 | Temp 97.6°F | Ht 63.0 in | Wt 272.6 lb

## 2021-12-07 DIAGNOSIS — Z7189 Other specified counseling: Secondary | ICD-10-CM

## 2021-12-07 DIAGNOSIS — G4733 Obstructive sleep apnea (adult) (pediatric): Secondary | ICD-10-CM

## 2021-12-07 DIAGNOSIS — Z9989 Dependence on other enabling machines and devices: Secondary | ICD-10-CM

## 2021-12-07 DIAGNOSIS — J9611 Chronic respiratory failure with hypoxia: Secondary | ICD-10-CM

## 2021-12-07 DIAGNOSIS — I2723 Pulmonary hypertension due to lung diseases and hypoxia: Secondary | ICD-10-CM

## 2021-12-07 DIAGNOSIS — J849 Interstitial pulmonary disease, unspecified: Secondary | ICD-10-CM | POA: Diagnosis not present

## 2021-12-07 NOTE — Progress Notes (Addendum)
_0  ID: Patty [name], female    DOB: 1951/10/30, 70 y.o.   MRN: [id number]  Chief Complaint  Patient presents with   Follow-up    dyspnea     Referring provider: No ref. provider found  HPI: 70 year old female former smoker seen for pulmonary consulokay guarded because then maybe they can meet him right now for the registryt 05/09/2017 for progressive dyspnea since 2004.[name] Patient has polycythemia vera followed by hematology and [place] She has Crohn's disease    IOV  05/09/2017  Chief Complaint  Patient presents with   Pulmonary Consult    Pt referred for SOB with activity x years. Pt states over the last year the DOE has worsened. Pt denies cough and CP/tightness and f/c/s.     70 year old obese female. In 2004 Started noticing insidious onset of shortness of breath. In 2005 more [place] from [place] and probably gain over 60 pounds of weight. She has extensive workup at that time according to her history. She was then diagnosed with polycythemia by Dr. [name] at [facility]. She does not recollect any phlebotomies for this. However in the last 4-5 years she's had worsening shortness of breath. Noticeable with exertion particularly in the gym and doing standing exercises but not so much with sitting exercises. Relieved by rest. When she does some yard work she notices some associated wheezing as well.  Walking desaturation test in the office she did desaturate to 88% and did get tachycardic. She says that a chest x-ray done by primary care physicians interstitial pulmonary fibrosis. I personally visualized the chest x-ray done 04/03/2017: There is some interstitial markings but it is obscured by her obesity. I'm not so certain that is definite ILD. There is no lab work in the record. There is no PFT a CT scan available for visualization     05/29/2017 Follow up : Dyspnea  Patient returns for a two-week follow-up. Patient was seen for pulmonary consult  05/09/2017 for progressive dyspnea since 2004. Patient was set up for a high resolution CT chest that showed a very mild basilar predominant subpleural reticulation and bronchiectasis which could be due to nonspecific interstitial pneumonitis.. CT did have incidental findings of enlarged pulmonary arteries and aortic and coronary calcification. Patient had pulmonary function test on May 23 that showed an FEV1 at 77%, ratio 73, FVC 81, no significant bronchodilator response, total lung capacity 100%, DLCO 52%. Overnight oximetry test did show significant desaturations. We discussed beginning oxygen at bedtime.  Patient denies significant cough. Says that she get short of breath with walking. Has daytime fatigue and low energy..  Interstitial lung disease patient questionnaire was completed as follows Patient has no previous use of Macrodantin, amiodarone, methotrexate. She has been treated with prednisone for her Crohn's in the past. Patient has had extensive travel with brief visits to multiple state and country's.. [place]. Did live in a apartment that she did notice mold for 4 years. She does have Crohn's disease and was on Mesamaline for many years. Patient says she has had a humidifier and hot tub and previous house. She has no indoor birds. She does have a dog. Patient says she was told that she had a blood clot in the past but was never found.   OV 08/15/2017  Chief Complaint  Patient presents with   Follow-up    Pt still gets occ. SOB. Other than that, pt states that she has been doing good.  Denies any cough or CP.     Follow-up multifactorial dyspnea with interstitial lung disease concern.   Regarding interstitial lung disease concern: Mid May 2018 she had high resolution CT chest that showed interstitial lung disease very mild but unclear if possible UIP pattern. Autoimmune test was negative. Isolated reduction in diffusion capacity 50%. [employer] of chest  physicians questionnaire shows previous Crohn's disease and also mold exposure previously. Overall she feels stable  Regarding dyspnea: She underwent echocardiogram in June 2018 showed grade 1 diastolic dysfunction. Had abnormal cardiac stress test July 2018 followed by cardiac cath early August 2018 that showed normal coronary angiogram but elevated left ventricular end-diastolic pressure. Dietary therapy has been recommended but she is unaware of these results.  Overall she is here to discuss these results.  IMPRESSION: 1. Suspect very mild basilar predominant subpleural reticulation and bronchiolectasis, which may be due to nonspecific interstitial pneumonitis. 2. Aortic atherosclerosis (ICD10-170.0). Coronary artery calcification. 3. Enlarged pulmonary arteries, indicative of pulmonary arterial hypertension.     Electronically Signed   By: Lorin Picket M.D.   On: 05/13/2017 07:33    OV 10/17/2017  Chief Complaint  Patient presents with   Follow-up    Pt states that she has had good days and bad days since last visit. States that when she was in [place] for a month, breathing was better; still became SOB but not as bad as in [place]. Pt's SOB is mainly on exertion. Denies any cough or CP.    Follow-up dyspnea that is multifactorial due to obesity, physical deconditioning and diastolic dysfunction Follow-up mild interstitial lung disease not otherwise specified  Last visit I started her on Lasix. I was only supposed to see herin 6 months or so. However she's had problems tolerating Lasix and potassium. It fluctuates between palpitations and hypertension. She feels the palpitations might be related to potassium depletion after taking Lasix. But she also tells me that the Lasix does help her dyspnea. At this point in time her condition is to see cardiologist. Off note she did send the email message asking these questions on 09/28/2017 made a reply that she tells me that she never got  the reply.   OV 02/13/2018  Chief Complaint  Patient presents with   Follow-up    PFT done 02/05/18.  Pt states she has been doing good. Was sick in january but states she is doing better.  DME: [facility] 4L pulse    Follow-up dyspnea that is multifactorial due to obesity, physical deconditioning and diastolic dysfunction Follow-up mild interstitial lung disease not otherwise specified v concern   70 year old female with concern for interstitial lung disease.  Since her last visit she continues to do well.  She is attending pulmonary rehabilitation.  She feels better and less short of breath.  She uses oxygen with exertion at rehab saying she needs it.  She has upcoming travel in summer to [place] and wanted some flight information oxygen form filled out.  She is wondering if mesalamine that she took several years ago was the cause of interstitial lung disease or her Crohn's disease.  But overall she is stable.  Pulmonary function test shows stability and FVC since last 1 year with some improvement in DLCO.  High-resolution CT scan of the chest interpreted by thoracic radiology report suspected ILD.   OV 08/28/2018  Chief Complaint  Patient presents with   Follow-up    PFT performed today.  Pt states she has had both good and bad  days since last visit. Pt denies any complaints of cough, SOB, or CP but states she is having some problems with congestion since she has been back from vacation. Pt has been having problems with getting O2 supplies taken care of with DME.    Follow-up dyspnea that is multifactorial due to obesity, physical deconditioning and diastolic dysfunction Follow-up concern for  mild interstitial lung disease not otherwise specified  Patty [name] - Presents for follow-up for the above issues. Her dyspnea significantly better following a 30 pound weight loss and continued rehabilitation. She uses oxygen with rehabilitation. Today walking desaturation test 185 feet 3 laps on room  air: She dropped to 87% on the second lap. But she is feeling better. She had spirometry today that shows improvement in FVC concomitant with weight loss but no change in diffusion capacity that reflects the presence of ongoing possible ILD.She continues to lose more weight she believes that she could lose another 30 pounds before the next visit.     OV 03/12/2019  Subjective:  Patient ID: Patty [name], female , DOB: 1951-01-05 , age 59 y.o. , MRN: [id number] , ADDRESS: [place] [id number]   03/12/2019 -   Chief Complaint  Patient presents with   Follow-up    PFT performed today.  Pt states she has been doing good since last visit. States she still becomes SOB with exertion, and has phlegm in the mornings, postnasal drainage which has used flonase to help. Pt also uses POC as needed.    Follow-up dyspnea that is multifactorial due to obesity, physical deconditioning and diastolic dysfunction Follow-up  mild interstitial lung disease not otherwise specifieds -with stability 2018 through 2020 [non-IPF pattern]; on observation   HPI Patty [name] 70 y.o. -presents for follow-up.  She is attending pulmonary rehabilitation and using oxygen with exertion.  Overall she feels stable.  Subjective symptom parameters and objective parameters are documented below.  She had high-resolution CT chest that showed shows that she has ILD.  At this point we can be confident that she has mild ILD but it is stable based on symptoms walking test and pulmonary function test.  The previous autoimmune test was negative.      OV 10/07/2019  Subjective:  Patient ID: Patty [name], female , DOB: 1951-08-11 , age 9 y.o. , MRN: [id number] , ADDRESS: [place] [id number]   10/07/2019 -   Chief Complaint  Patient presents with   Follow-up     Follow-up dyspnea that is multifactorial due to obesity, physical deconditioning and diastolic dysfunction Follow-up  mild  interstitial lung disease not otherwise specifieds -with stability 2018 through 2020 [non-IPF pattern]; on observation Follow-up mild pulmonary emphysema present on high-resolution CT chest  HPI Patty [name] 71 y.o. -presents for follow-up.  Prior visit was pre-pandemic.  Since then she has been sedentary at home.  She tells me that she is more short of breath.  The symptom scores below reflect that.  She thinks is because of the 30 pound weight gain because of sedentary living and social isolation following the onset of the pandemic.  She is frustrated by this.  Deep down she thinks her ILD is stable.  Pulmonary function test shows a drop in FVC but stability and DLCO suggesting weight gain is an ongoing issue for worsening dyspnea and pulmonary function test.  She is willing to have a high-dose flu shot today.  She also tells me that she is not taking her  Spiriva because it was expensive.  Although it did help her she is willing to try again and try to price it.  She also stated that her mom who never smoked had emphysema but her dad smoked heavily.  She has never been tested for alpha-1.   [place] -dropped. But tries not to use it in public    IMPRESSION: 1. Very mild scattered basilar subpleural reticulation and ground-glass similar to 01/29/2018. Nonspecific interstitial pneumonitis can not be excluded. Findings are indeterminate for UIP per consensus guidelines: Diagnosis of Idiopathic Pulmonary Fibrosis: An Official ATS/ERS/JRS/ALAT Clinical Practice Guideline. [place], Iss 5, [id number], Aug 26 2017. 2. Question cirrhosis. 3. Aortic atherosclerosis (ICD10-170.0). Coronary artery calcification. 4. Enlarged arteries, arterial hypertension. 5.  Emphysema (ICD10-J43.9).     Electronically Signed   By: Lorin Picket M.D.   On: 03/06/2019 16:59   ROS - per HPI     OV 06/23/2020  Subjective:  Patient ID: Patty [name], female ,  DOB: February 01, 1951 , age 72 y.o. , MRN: [id number] , ADDRESS: [address] [place] [address]   06/23/2020 -   Chief Complaint  Patient presents with   Follow-up    shortness of breath with exertion   Follow-up dyspnea that is multifactorial due to obesity, physical deconditioning and diastolic dysfunction  Follow-up  mild interstitial lung disease not otherwise specifieds -with stability 2018 through 2020 [non-IPF pattern/indeterminate   -; on observation  - last CT Mach 2020 0> July 2021 wthout progression  Follow-up mild pulmonary emphysema present on high-resolution CT chest - alpha 1 MM  - o2 with exertion  - advised spiriva oct 2020 but too expensive  HPI Patty [name] 70 y.o. -returns for routine follow-up.  She says she has been doing well.  She uses portable oxygen cranks it up to 4 L - 5 L and then walks every day for 30 minutes.  Overall she feels stable.  Her symptom score itself is stable.  However on pulmonary function testing her FVC shows a decline.  This decline started in October 2020 and since then it is stable.  She attributes this to weight gain.  There is what she told me last time as well.  However when we walked her she seemed to desaturate much more easily.  We also needed to corrected desaturation at 5 L.  She thinks it is because the portable oxygen system is not delivering oxygen currently and is a technical issue with a portable machine.  However the distance of desaturation seems to worsen.  There is no leg swelling although she has some mild varicose veins at baseline.  She is not on nighttime oxygen and she recollects attest that this some years ago and was normal back then.  Currently she is only using portable oxygen.  Her last echocardiogram was few years ago.  There is no hemoptysis or worsening cough.  She got a Covid vaccine in February 2021 and she thinks things might of changed since then.   Results for KAMARA, ALLAN ANN (MRN [id number]) as of  06/23/2020 11:49  Ref. Range 05/17/2017 13:11 02/05/2018 12:14 08/28/2018 11:22 03/12/2019 14:47 10/01/2019 15:58 30 pound weight gain 06/09/2020 11:25  FVC-Pre Latest Units: L 2.61 2.57 2.80 2.73 2.38 2.42  FVC-%Pred-Pre Latest Units: % 84 83 94 91 81 82  FEV1-Pre Latest Units: L 1.83 1.71 1.96 1.83 1.65 1.53   Results for EMANUELLE, HAMMERSTROM ANN (MRN [id number]) as of 06/23/2020 11:49  Ref. Range 05/17/2017 13:11 02/05/2018 12:14 08/28/2018 11:22 03/12/2019 14:47 10/01/2019 15:58 06/09/2020 11:25  DLCO unc Latest Units: ml/min/mmHg 12.64 15.15 14.53 13.77 14.39 13.48  DLCO unc % pred Latest Units: % 52 62 62 72 75 70    ROS - per HPI     OV 02/23/2021  Subjective:  Patient ID: Patty [name], female , DOB: 1951/01/31 , age 89 y.o. , MRN: [id number] , ADDRESS: [place] [id number] PCP [name], MD Patient Care Team: [name], MD as PCP - General (Family Medicine) Pyrtle, Lajuan Lines, MD as Consulting Physician (Gastroenterology) Shamleffer, Melanie Crazier, MD as Consulting Physician (Endocrinology) Brand Males, MD as Consulting Physician (Pulmonary Disease)  This Provider for this visit: Treatment Team:  Attending Provider: Brand Males, MD   Follow-up dyspnea that is multifactorial due to obesity, physical deconditioning and diastolic dysfunction, ILD and new dx Group 3 PAH - dec 2021  45 pppd prior smoking hix  Follow-up  mild interstitial lung disease not otherwise specifieds -with stability 2018 through 2020 [non-IPF pattern/indeterminate   -; on observation  - last CT Mach 2020 - > July 2021 without progression  Inconsisent dxx of  pulmonary emphysema present on high-resolution CT chest - alpha 1 MM  - o2 with exertion  - advised spiriva oct 2020 but too expensive  Reported in only 1 CT. No mention in July 2021 CT  WHO group 3 Pulm htn with elevated PCWD - [facility] 12/07/20:   RA 13/12 mean 9 mmHg RV 48/8, EDP 12 PA 50/29 mean 38 PCWP 22/20  mean 20   O2 saturations: PA 62, Ao 97, SVC 64   CO 4.26 L/m, CI 1.96 Trans-pulmonic gradient 18 mmHg PVR 4.2 Woods Units  Grade 1 Disast dysfhn  - reportred July 2021 echo  Obesity  02/23/2021 -   Chief Complaint  Patient presents with   Follow-up    Doing ok     HPI Patty [name] 70 y.o. -returns for 19-monthfollow-up.  She has inconsistent diagnosis of emphysema.  Was reported in 1 scan but not another scans.  She has ILD.  She is foregoing biopsy.  She tells me overall that she is stable.  Although there are some weight gain.  She feels in the interim Metformin messed up her GI system and cause weight gain but she is glad to be out of it.  She had 6-minute walk test on 02/15/2021 at pulmonary rehabilitation.  She walked 7 and 15 feet in 6 minutes.  She did not break at all.  Her pulse ox dropped below 88% 2 times.  She corrected with 4 L of oxygen.  Her resting oxygen saturation was 96%.  And lowest was 86%.  She also had right heart catheterization in December 2021.  She has elevated pulmonary pressures but also elevated wedge.  Cardiology is following this up and she is on diuresis.  We spent some time discussing her care option.  She had pulmonary function test that shows stability in the last year and a half but declined in 2 years time.  Her high-resolution CT scan of the chest done in summer 2021 shows stability over 4 years.  She is not interested in antifibrotic's because of her colitis and also given her stability.  She does not want to go through pulmonary lung biopsy given the risks  She generally tries to avoid medications.  She is happy going to pulmonary rehabilitation.  We discussed inhaled treprostinil  as approved therapy for pulmonary hypertension -expressed to her the overall safety profile for over 20 years and approved recently for pulmonary hypertension and WHO group 3.  Explained the minimal side effect risk but she does not want to go through this  We  discussed the option of participating in a clinical trial.  We discussed what is called the PULSE inhaled nitric oxide device study.  This is a 28-week plus study.  It was a device and a nitric oxide supply study.  She is actually interested in this.  She is participating clinical trials.  She understands the voluntary nature of this.  She understands the risks that come with clinical trials.  She understands the control of risk through close follow-up and interventions..  However her 6-minute walk test is quite adequate and also she starting pulmonary rehabilitation so she would not be able to participate in this trial for several months.  In addition she is aware that inhaled treprostinil which is standard of care therapy is currently on exclusion in this protocol current amendment.  She understands if she qualifies and chooses this trial that this would be a limitation.  We have given her the consent for review but at this time she does not qualify for this trial.  HRCT July 2021   IMPRESSION: 1. Spectrum of findings suggestive of a mild basilar predominant fibrotic interstitial lung disease without appreciable interval progression since baseline 05/12/2017 high-resolution chest CT. Favor NSIP, with UIP not excluded. Findings are indeterminate for UIP per consensus guidelines: Diagnosis of Idiopathic Pulmonary Fibrosis: An Official ATS/ERS/JRS/ALAT Clinical Practice Guideline. [place], Iss 5, [id number], Aug 26 2017. 2. Dilated main pulmonary artery, stable, suggesting chronic pulmonary arterial hypertension. 3. Aortic Atherosclerosis (ICD10-I70.0).     Electronically Signed   By: Ilona Sorrel M.D.   On: 07/10/2020 15:50     OV 05/31/2021  Subjective:  Patient ID: Patty [name], female , DOB: Oct 20, 1951 , age 29 y.o. , MRN: [id number] , ADDRESS: [place] [id number] PCP [name], MD Patient Care Team: [name], MD as PCP  - General (Family Medicine) Pyrtle, Lajuan Lines, MD as Consulting Physician (Gastroenterology) Shamleffer, Melanie Crazier, MD as Consulting Physician (Endocrinology) Brand Males, MD as Consulting Physician (Pulmonary Disease)  This Provider for this visit: Treatment Team:  Attending Provider: Brand Males, MD    Follow-up dyspnea that is multifactorial due to obesity, physical deconditioning and diastolic dysfunction, ILD and new dx Group 3 PAH - dec 2021  45 pppd prior smoking hix  Follow-up  mild interstitial lung disease not otherwise specifieds -with stability 2018 through 2020 [non-IPF pattern/indeterminate   -on observation  -last CT Mach 2020 - > July 2021 without progression  Inconsisent dxx of  pulmonary emphysema present on high-resolution CT chest - alpha 1 MM  - o2 with exertion  - advised spiriva oct 2020 but too expensive  Reported in only 1 CT. No mention in July 2021 CT  WHO group 3 Pulm htn with elevated PCWD - [facility] 12/07/20:   RA 13/12 mean 9 mmHg RV 48/8, EDP 12 PA 50/29 mean 38 PCWP 22/20 mean 20   O2 saturations: PA 62, Ao 97, SVC 64   CO 4.26 L/m, CI 1.96 Trans-pulmonic gradient 18 mmHg PVR 4.2 Woods Units  Grade 1 Disast dysfhn  - reportred July 2021 echo  Obesity   05/31/2021 -   Chief Complaint  Patient presents with  Follow-up    Pt states she has been doing okay since last visit. States she has begun pulmonary rehab which has helped.     HPI Patty [name] 70 y.o. -returns for follow-up.  She continues on supportive care with oxygen.  Room air at rest 3-5 L pulsed with oxygen.  For the last few months she is on pulsed oxygen.  She uses 2 L nasal cannula at night with her CPAP.  She is completed pulmonary rehabilitation.  For the last few months she is using pulsed oxygen.  She feels this works better.  She is [facility] [employer] [facility].  She does elliptical or treadmill.  She feels her symptoms have improved.  She is continue to  gain some weight though.  She again reported that she feels sensitive to medication and wants avoid antifibrotic's.  She was interested this time and discussing treprostinil versus pulsed research protocol for patients with interstitial lung disease and hypoxemic respiratory failure.  She has pulmonary hypertension based on 2021 December right heart catheterization.  We discussed the long-term safety profile of inhaled treprostinil.  Discussed the side effect profile.  After reflection about the fact the increase study showed improvement in 6-minute walk test, subgroup analysis potential modification of ILD and other composite outcome improvement.     IMPRESSION: 1. Spectrum of findings suggestive of a mild basilar predominant fibrotic interstitial lung disease without appreciable interval progression since baseline 05/12/2017 high-resolution chest CT. Favor NSIP, with UIP not excluded. Findings are indeterminate for UIP per consensus guidelines: Diagnosis of Idiopathic Pulmonary Fibrosis: An Official ATS/ERS/JRS/ALAT Clinical Practice Guideline. [place], Iss 5, [phone], Aug 26 2017. 2. Dilated main pulmonary artery, stable, suggesting chronic pulmonary arterial hypertension. 3. Aortic Atherosclerosis (ICD10-I70.0).     Electronically Signed   By: Ilona Sorrel M.D.   On: 07/10/2020 15:50   OV 09/07/2021  Subjective:  Patient ID: Patty [name], female , DOB: 01/11/1951 , age 50 y.o. , MRN: [id number] , ADDRESS: [place] [phone] PCP [name], MD Patient Care Team: [name], MD as PCP - General (Family Medicine) Hilarie Fredrickson, Lajuan Lines, MD as Consulting Physician (Gastroenterology) [facility], Melanie Crazier, MD as Consulting Physician (Endocrinology) Brand Males, MD as Consulting Physician (Pulmonary Disease)  This Provider for this visit: Treatment Team:  Attending Provider: Brand Males, MD  Chronic  hypoxemic respiratory failure due to all of the below ILD (interstitial lung disease) [facility])  -  Decline PF March 2020 through February 2022  but stable October 2020 through February 2022 on pulmonary function test -Stable on CT scan of the chest 2018 through July 2021 -Non-IPF likely - such as NSIP -Overall supportive care of plan without antifibrotic's given history of colitis [antifibrotic's can exacerbate this situation] and prior stability -You are using room air at rest with 3-5 L with exertion -pulse oxygen for the last few months    WHO group 3 pulmonary arterial hypertension ([place]) with elevaed PCWP to 20 on Dec 2021 - started tyvaso July 2022  [place] group 3 pulmonary arterial hypertension ([place]) with elevaed PCWP to 20 on Dec 2021 - -Continue CPAP at ngith with 2L [place] at night  09/07/2021 -   Chief Complaint  Patient presents with   Follow-up    PFT performed today.  States that she still becomes SOB with exertion. Pt is now on Tyvaso and states she finds herself needing her O2 more.  HPI Patty [name] 70 y.o. -returns for follow-up.  She started treprostinil in July 2022.  She says initially started doing better.  But once she got to 7 puffs 4 times daily she started getting more short of breath.  She felt overall that she is able to take a deep breath and feel less fatigued but when she got to that dose she said 1 time she walked across the room without oxygen which she normally walks without oxygen and she felt dizzy and winded.  Since then the symptoms have persisted.  She is also gained significant amount of weight.  She is gained 16 pounds.  Her pulmonary function test is worse with a decline in both FVC and DLCO.  She says she is now needing 6 L of nasal cannula oxygen when she exercises.  Previously 5 L nasal cannula.  Symptom score wise and walking desaturation test wise she seems the same.  She does not want to do antifibrotic's because of prior history of colectomy.   At this point she is not sure if the treprostinil is helping her or hurting her or not doing anything.  OV 10/19/2021  Subjective:  Patient ID: Patty [name], female , DOB: 1951/09/20 , age 89 y.o. , MRN: [id number] , ADDRESS: [place] [id number] PCP [name], MD Patient Care Team: [name], MD as PCP - General (Family Medicine) Hilarie Fredrickson, Lajuan Lines, MD as Consulting Physician (Gastroenterology) [facility], Melanie Crazier, MD as Consulting Physician (Endocrinology) Brand Males, MD as Consulting Physician (Pulmonary Disease)  This Provider for this visit: Treatment Team:  Attending Provider: Brand Males, MD   Chronic hypoxemic respiratory failure due to all of the below ILD (interstitial lung disease) ([place]) - 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 - diagnosed Sept 2022 -Non-IPF likely - such as NSIP -  Decline PF March 2020 through February 2022  but stable October 2020 through February 2022  -> declined sept 2022 PFT -Stable on CT scan of the chest 2018 through July 2021 -You are using room air at rest with 3-5 L with exertion -pulse oxygen for the last few months - started esbriet     WHO group 3 pulmonary arterial hypertension ([place]) with elevaed PCWP to 20 on Dec 2021 - started tyvaso July 2022 -> stopped sept 2022 of concern PFT declined could ave been due to tyvaso -> but restarted later in sept 2022   March ARB group 3 pulmonary arterial hypertension ([place]) with elevaed PCWP to 20 on Dec 2021 - -Continue CPAP at ngith with 2L [place] at night   10/19/2021 -  followup ILD   Type of visit: Telephone/Video Circumstance: COVID-19 national emergency Identification of patient Patty [name] with 11/17/1951 and MRN [id number] - 2 person identifier Risks: Risks, benefits, limitations of telephone visit explained. Patient understood and verbalized agreement to proceed Anyone else on call: just patient Patient location: [phone] - her cell.   At her home This provider location: [place] 70 y.o. - esbriet still pending. BAck on tyvaso since mid-late sept 2022. Now doing it 4x/day - 5x/each time.   Also reports sewage leak since April/May 2022 Occassional odor. Some leakage through cleanup pipe. FEels this has made her respiration worse instead of tyvaso. Initialy she thought was tyvaso but later realized might have been due to sweage leak and finally fixed in sept 2022.She feels after long term stability things got worse because of sweage leak.  She feels since leak fixed (Same time went back on tyvaso) she is better. In gym mets were down to 2.1 but now upto 2.4 mets (baseline 2.7 mets). NExt visit with mid dec 2022.   She wants to know if progression is perrmanent  Esbriet sill in process. Arriving in 2 days. She wil start at 1 pill tid x 1 week -> 2 pill tid and maintain  She can do visit 11/04/21 - 3.00pm - with [name] - video to assess followup on esbriet    OV 11/11/21 - video APP  70 yo female former smoker followed for ILD and Emphysema  Medical history significant for Crohn's Disease and DCHF   Today's video visit is a 1 month follow up for ILD . She has recently started on Esbriet 2 weeks ago, does notice some symptoms of fatigue , joint aches and fatigue. Started on Esbriet 267 mg 2 tab Twice daily, this week. No v/d. Some nausea . No bloody stools. Some intermittent pain under right ribs on/off. No urinary issues. No syncope.  Remains on Tyvaso Four times a day   Remains on Oxygen 6l/m with activity and 2l/m At bedtime  . None at rest  No change in O2 requirements   OV 12/07/2021  Subjective:  Patient ID: Patty [name], female , DOB: 03-14-1951 , age 89 y.o. , MRN: [id number] , ADDRESS: [place] [id number] PCP [name], MD Patient Care Team: [name], MD as PCP - General (Family Medicine) Josue Hector, MD as PCP - Cardiology  (Cardiology) Pyrtle, Lajuan Lines, MD as Consulting Physician (Gastroenterology) [facility], Melanie Crazier, MD as Consulting Physician (Endocrinology) Brand Males, MD as Consulting Physician (Pulmonary Disease)  This Provider for this visit: Treatment Team:  Attending Provider: Brand Males, MD    12/07/2021 -   Chief Complaint  Patient presents with   Follow-up    PFT performed 12/12. Pt states she began Esbriet November 2022 and said that she has had different side effects from the med.    Chronic hypoxemic respiratory failure due to all of the below ILD (interstitial lung disease) ([place]) - 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 - diagnosed Sept 2022 -Non-IPF likely - such as NSIP -  Decline PF March 2020 through February 2022  but stable October 2020 through February 2022  -> declined sept 2022 PFT -Stable on CT scan of the chest 2018 through July 2021 -You are using room air at rest with 3-5 L with exertion -pulse oxygen for the last few months - started esbriet  oct/nov 2022 - stopped GI intolerance 11/29/21    WHO group 3 pulmonary arterial hypertension ([place]) with elevaed PCWP to 20 on Dec 2021 - started tyvaso July 2022 -> stopped sept 2022 of concern PFT declined could ave been due to tyvaso -> but restarted later in sept 2022   WHO group 3 pulmonary arterial hypertension ([place]) with elevaed PCWP to 20 on Dec 2021 - -Continue CPAP at ngith with 2L [place] at  HPI Patty [name] 70 y.o. -at last visit we started pirfenidone but as soon as she went up to 2 pills 3 times daily she started having significant side effects including drop in urine output therefore she stopped.  Most of the side effects are GI.  Present even at 1 pill 3 times daily.  She says stopped pirfenidone yesterday and ever since then beginning to feel better especially today.  Continues on oxygen at the same level.  Is on inhaled treprostinil nebulizer.  Currently increased it to 6 times at 4 times a day.   Slowly going to work her self up.  She held off on escalating this while she was on pirfenidone start.  We looked at her file-per registry but need clarification from radiology about whether she has traction bronchiectasis.  In addition need to know the current status of her polycythemia.  Last CT scan of the chest was in 2021.  We discussed about the fact that her ILD is progressing.  She is lives alone.  No siblings in the city parents are deceased.  No children.  She has neighbors.  She is assigned to healthcare for her 70 her brother in [place].  She says she regularly updates him.  We did not discuss CODE STATUS.  Offered home palliative care and she is willing.  She knows that she needs to get medical alert.        SYMPTOM SCALE - ILD 03/12/2019  10/07/2019 30# wt gain  06/23/2020 250# 09/07/2021 266# - tyvaso since July 2022 12/07/2021 272#  O2 use RA ra   2-6L  Shortness of Breath 0 -> 5 scale with 5 being worst (score 6 If unable to do)   0 0  At rest 0 0 0 0 with o2 0  Simple tasks - showers, clothes change, eating, shaving 0 0  0 0  Household (dishes, doing bed, laundry) 0 0 0 0 4  Shopping 0 0 0 0 1  Walking at own pace 0 4.5 4._0 Walking up Stairs 1-_1 Total (40 - 48) Dyspnea Score 2 9.5 9.5 6 al with o2 14  How bad is your cough? 0 0 0 0 0  How bad is your fatigue 0 1 0 2 3  nausea   0 0 0  vomit   0 0 0  diarrhe   0 2 3  axniety   _2 depression   0 1 1        Simple office walk 185 feet x  3 laps goal with forehead probe 03/12/2019  10/07/2019  06/23/2020  09/07/2021   O2 used Room air Room air Room air ra  Number laps completed 3 2nd lap 1 lap 1 lap  Comments about pace slow slow slow   Resting Pulse Ox/HR 100% and 85/min 96% and 72/min 97% and 77 97% and 76  Final Pulse Ox/HR 88% and 135/min 86% and 105/min 87% and 112/min 83 and 112/min  Desaturated </= 88% yes     Desaturated <= 3% points Yes, 12     Got Tachycardic >/= 90/min yes      Symptoms at end of test Mild dyspnea  Corrected with 5L [place]. Even with 4L [place] downt to  87% Corrected with 6LNC at 94$ for antoher 2lpas  Miscellaneous comments x  ? worse     CT Chest data  No results found.    PFT  PFT Results Latest Ref Rng & Units 12/06/2021 09/07/2021 02/22/2021 06/09/2020 10/01/2019 03/12/2019 08/28/2018  FVC-Pre L 2.32 2.28 2.38 2.42 2.38 2.73 2.80  FVC-Predicted Pre % 80 79 81 82 81 91 94  FVC-Post L - - - - - - -  FVC-Predicted Post % - - - - - - -  Pre FEV1/FVC % % 60 62 63 63 69 67 70  Post FEV1/FCV % % - - - - - - -  FEV1-Pre  L 1.38 1.41 1.50 1.53 1.65 1.83 1.96  FEV1-Predicted Pre % 63 65 68 68 74 80 86  FEV1-Post L - - - - - - -  DLCO uncorrected ml/min/mmHg 11.82 11.94 15.05 13.48 14.39 13.77 14.53  DLCO UNC% % 62 63 79 70 75 72 62  DLCO corrected ml/min/mmHg 11.45 11.94 27.44 12.89 - - -  DLCO COR %Predicted % 60 63 144 67 - - -  DLVA Predicted % 66 70 152 70 75 72 65  TLC L - - - - - - -  TLC % Predicted % - - - - - - -  RV % Predicted % - - - - - - -       has a past medical history of Abnormal nuclear stress test, Anxiety, Arthritis, Chronic diastolic CHF (congestive heart failure) ([place]), Crohn disease ([place]), DVT (deep venous thrombosis) ([employer]), Dyspnea, Grade I diastolic dysfunction, Hyperlipidemia, ILD (interstitial lung disease) ([place]), OSA on CPAP, Pernicious anemia, Polycythemia vera ([place]), Pulmonary embolism ([employer]) (2010), and Sleep disorder (01/10/2020).   reports that she quit smoking about 12 years ago. Her smoking use included cigarettes. She has a 45.00 pack-year smoking history. She has never used smokeless tobacco.  Past Surgical History:  Procedure Laterality Date    RIGHT BREAST LUMPECTOMY WITH RADIOACTIVE SEED LOCALIZATION (Right Breast)  02/04/2021   BIOPSY  09/30/2019   Procedure: BIOPSY;  Surgeon: Jerene Bears, MD;  Location: [facility];  Service: Gastroenterology;;   BREAST LUMPECTOMY WITH RADIOACTIVE SEED LOCALIZATION Right  02/04/2021   Procedure: RIGHT BREAST LUMPECTOMY WITH RADIOACTIVE SEED LOCALIZATION;  Surgeon: Coralie Keens, MD;  Location: [facility];  Service: General;  Laterality: Right;   CARDIAC CATHETERIZATION     COLON RESECTION  1993   COLONOSCOPY WITH PROPOFOL N/A 09/30/2019   Procedure: COLONOSCOPY WITH PROPOFOL;  Surgeon: Jerene Bears, MD;  Location: [facility];  Service: Gastroenterology;  Laterality: N/A;   HIP ARTHROPLASTY Right    x 2, initial right THA, then subsequent right THA revision    LEFT HEART CATH AND CORONARY ANGIOGRAPHY N/A 07/28/2017   Procedure: Left Heart Cath and Coronary Angiography;  Surgeon: Burnell Blanks, MD;  Location: [facility];  Service: Cardiovascular;  Laterality: N/A;   POLYPECTOMY  09/30/2019   Procedure: POLYPECTOMY;  Surgeon: Jerene Bears, MD;  Location: [facility];  Service: Gastroenterology;;   RIGHT HEART CATH N/A 12/07/2020   Procedure: RIGHT HEART CATH;  Surgeon: Sherren Mocha, MD;  Location: [facility];  Service: Cardiovascular;  Laterality: N/A;    Allergies  Allergen Reactions   Victoza [Liraglutide] Shortness Of Breath   Lasix [Furosemide] Itching    Hypersensitivity - pt can take as needed    Metformin And Related Diarrhea    Increased moodiness   Onion Other (See Comments)    sereve diarrhea, stomach pains (Crohn's flares)   Pirfenidone Other (See Comments)    GI side effects   Potassium-Containing Compounds Other (See Comments)    Due to chron's, difficult passing though kidneys not allowing absorption in the body    Prednisone Other (See Comments)    Altered mood   Sulfa Drugs Cross Reactors Other (See Comments)    "feels like bugs crawling on me"    Immunization History  Administered Date(s) Administered   Fluad Quad(high Dose 65+) 10/07/2019, 09/09/2020, 11/27/2021   Influenza, High Dose Seasonal PF 08/28/2018   Influenza-Unspecified 11/23/2017   PFIZER(Purple Top)SARS-COV-2 Vaccination 02/16/2020,  03/17/2020, 12/14/2020, 05/14/2021, 10/27/2021  Pension scheme manager 96yr & up 10/27/2021   Pneumococcal Conjugate-13 02/13/2018   Pneumococcal Polysaccharide-23 03/12/2019    Family History  Problem Relation Age of Onset   Glaucoma Mother    High blood pressure Mother    High Cholesterol Mother    Sleep apnea Mother    Diabetes Mellitus II Father    Sleep apnea Father    Anxiety disorder Father    COPD Brother    Heart disease Brother    Non-Hodgkin's lymphoma Brother    Diabetes Mellitus II Brother    Glaucoma Brother    Colon cancer Neg Hx    Esophageal cancer Neg Hx    Pancreatic cancer Neg Hx    Stomach cancer Neg Hx    Liver disease Neg Hx      Current Outpatient Medications:    amoxicillin (AMOXIL) 500 MG capsule, Take 2,000 mg by mouth See admin instructions. Before dental procedures, Disp: , Rfl:    aspirin EC 81 MG tablet, Take 81 mg by mouth daily., Disp: , Rfl:    b complex vitamins capsule, Take 1 capsule by mouth once a week., Disp: , Rfl:    Calcium Carbonate-Vit D-Min (CALCIUM 1200) 1200-1000 MG-UNIT CHEW, Chew 1 tablet by mouth daily., Disp: , Rfl:    CANNABIDIOL PO, Take 1 Dose by mouth 2 (two) times daily as needed (anxiety/sleep.). CBD Oil, Disp: , Rfl:    cetirizine (ZYRTEC) 10 MG tablet, Take 10 mg by mouth daily as needed (sinus/allergies.)., Disp: , Rfl:    cetirizine-pseudoephedrine (ZYRTEC-D) 5-120 MG tablet, Take 1 tablet by mouth daily as needed (sinus headaches.)., Disp: , Rfl:    clonazePAM (KLONOPIN) 0.5 MG tablet, TAKE 1/2 TO 1 TABLETS (0.25-0.5 MG TOTAL) BY MOUTH DAILY AS NEEDED (FOR ANXIETY OR SLEEP.)., Disp: 90 tablet, Rfl: 1   cyanocobalamin (,VITAMIN B-12,) 1000 MCG/ML injection, INJECT 1 ML (1,000 MCG TOTAL) INTO THE MUSCLE EVERY 30 (THIRTY) DAYS. INJECT 1 ML (1,000 MCG) INTO THE MUSCLE EVERY 30 DAYS, Disp: 12 mL, Rfl: 0   FLUoxetine (PROZAC) 20 MG capsule, Take 1 capsule (20 mg total) by mouth daily., Disp: 90 capsule,  Rfl: 3   fluticasone (FLONASE) 50 MCG/ACT nasal spray, Place 1-2 sprays into both nostrils daily as needed for allergies or rhinitis., Disp: , Rfl:    Glycerin-Polysorbate 80 (REFRESH DRY EYE THERAPY OP), Place 1 drop into both eyes daily., Disp: , Rfl:    hydrocortisone (PROCTOZONE-HC) 2.5 % rectal cream, Place 1 application rectally 2 (two) times daily as needed (Crohn's flare)., Disp: 28 g, Rfl: 2   ibuprofen (ADVIL) 200 MG tablet, Take 400-800 mg by mouth every 8 (eight) hours as needed (pain.)., Disp: , Rfl:    mesalamine (PENTASA) 250 MG CR capsule, Take 250-500 mg by mouth 4 (four) times daily as needed (crohn's disease flare ups)., Disp: , Rfl:    Multiple Vitamins-Minerals (ICAPS AREDS 2 PO), Take 1 tablet by mouth daily., Disp: , Rfl:    prednisoLONE acetate (PRED FORTE) 1 % ophthalmic suspension, Place 1 drop into both eyes 2 (two) times daily as needed (Uveitis)., Disp: , Rfl:    Probiotic Product (PROBIOTIC PO), Take 1 capsule by mouth 2 (two) times a week. As needed, Disp: , Rfl:    traMADol (ULTRAM) 50 MG tablet, Take 1 tablet (50 mg total) by mouth every 6 (six) hours as needed., Disp: 20 tablet, Rfl: 0   Treprostinil (TYVASO) 0.6 MG/ML SOLN, Inhale into the lungs 4 (four) times  daily., Disp: , Rfl:    furosemide (LASIX) 20 MG tablet, Take 1 tablet (20 mg total) by mouth as needed for fluid., Disp: 90 tablet, Rfl: 3 No current facility-administered medications for this visit.  Facility-Administered Medications Ordered in Other Visits:    6 CHG cloth bath night before surgery, , , Once **AND** 6 CHG cloth bath AM of surgery, , , Once **AND** Chlorhexidine Gluconate Cloth 2 % PADS 6 each, 6 each, Topical, Once **AND** Chlorhexidine Gluconate Cloth 2 % PADS 6 each, 6 each, Topical, Once, Coralie Keens, MD      Objective:   Vitals:   12/07/21 1356  BP: 134/82  Pulse: 80  Temp: 97.6 F (36.4 C)  TempSrc: Oral  SpO2: 95%  Weight: 272 lb 9.6 oz (123.7 kg)  Height: _0   (1.6 m)    Estimated body mass index is 48.29 kg/m as calculated from the following:   Height as of this encounter: _1  (1.6 m).   Weight as of this encounter: 272 lb 9.6 oz (123.7 kg).  _2 @  Filed Weights   12/07/21 1356  Weight: 272 lb 9.6 oz (123.7 kg)     Physical Exam   General: No distress. obese Neuro: Alert and Oriented x 3. GCS 15. Speech normal Psych: Pleasant Resp:  Barrel Chest - no.  Wheeze - no, Crackles - yes, No overt respiratory distress CVS: Normal heart sounds. Murmurs - no Ext: Stigmata of Connective Tissue Disease - no HEENT: Normal upper airway. PEERL +. No post nasal drip        Assessment:       ICD-10-CM   1. ILD (interstitial lung disease) ([place])  J84.9 CT Chest High Resolution    2. WHO group 3 pulmonary arterial hypertension (HCC)  I27.23     3. Chronic hypoxemic respiratory failure (HCC)  J96.11     4. OSA on CPAP  G47.33    Z99.89     5. Advanced care planning/counseling discussion  Z71.89          Plan:     Patient Instructions  Chronic hypoxemic respiratory failure due to all of the below ILD (interstitial lung disease) ([place]) - progressive phenotype diagnosed  WHO group 3 pulmonary arterial hypertension ([place]) with elevaed PCWP to 20 on Dec 2021   Possible progressioni in sept 2022 was due to sewage leak. . Compared to Feb 2022 worse but compared to Sept 2022 -> Dec 2022 stable. Futgure course is not known but could be at risk for future progression   Glad rechallenge tyvaso is working well - currently at 4x/day - using 6 times each time  Too bad esbriet cause side effect  Plan - continue tyvaso and slowly increase dose  -list esbriet as allergy (GI side effect) - will not do ofev due to esbriet issues and also other history of diverticulities --Continue oxygen 3-6 L nasal cannula pulsed with exertion and at night 2 L nasal cannula and room air at rest -Continue exercise program  -However continue to keep  her pulse ox at greater than or equal to 88% using a pulse oximeter -consider ILD-PRO registry (consent given sept 2022)  - reearch CRC will try to get addendum eval on CT if there is traction bronchiectasis --Make attempts to lose weight - Ge HRCT supine and prone 2 months  OSA on CPAP  Stable  Plan  -  plan -Continue CPAP at ngith with 2L [place] at night  per the advice of his  sleep doctor - at some point can check o2 level at night   [place]  - glad brother is health guardian and personal will is completed - glad living will complete  Plan  - regular updates to brother about health status - evaluate medic-alert and local support neighbors/friends  - local support group leader is Marlane Mingle- [email or web address] - refer home palliative care support  Follow-up  - 8-10 weeks face to face with Dr Chase Caller - 37 min slot after HRCT    SIGNATURE    Dr. Brand Males, M.D., F.C.C.P,  Pulmonary and Critical Care Medicine Staff Physician, [place] Director - Interstitial Lung Disease  Program  [facility] [place], [place], [address]  Pager: [phone], If no answer or between  15:00h - 7:00h: call [phone] Telephone: [phone]  2:36 PM 12/07/2021

## 2021-12-07 NOTE — Progress Notes (Signed)
Care Management   Follow Up Note   12/07/2021 Name: Patty [name] MRN: [id number] DOB: 1951/09/07   Referred by: [name], MD Reason for referral : No chief complaint on file.   An unsuccessful telephone outreach was attempted today. The patient was referred to the case management team for assistance with care management and care coordination.   Follow Up Plan: The care management team will reach out to the patient again over the next 7 days.   [place]

## 2021-12-07 NOTE — Patient Instructions (Addendum)
Chronic hypoxemic respiratory failure due to all of the below ILD (interstitial lung disease) ([place]) - progressive phenotype diagnosed  WHO group 3 pulmonary arterial hypertension ([place]) with elevaed PCWP to 20 on Dec 2021   Possible progressioni in sept 2022 was due to sewage leak. . Compared to Feb 2022 worse but compared to Sept 2022 -> Dec 2022 stable. Futgure course is not known but could be at risk for future progression   Glad rechallenge tyvaso is working well - currently at 4x/day - using 6 times each time  Too bad esbriet cause side effect  Plan - continue tyvaso and slowly increase dose  -list esbriet as allergy (GI side effect) - will not do ofev due to esbriet issues and also other history of diverticulities --Continue oxygen 3-6 L nasal cannula pulsed with exertion and at night 2 L nasal cannula and room air at rest -Continue exercise program  -However continue to keep her pulse ox at greater than or equal to 88% using a pulse oximeter -consider ILD-PRO registry (consent given sept 2022)  - reearch CRC will try to get addendum eval on CT if there is traction bronchiectasis --Make attempts to lose weight - Ge HRCT supine and prone 2 months  OSA on CPAP  Stable  Plan  -  plan -Continue CPAP at ngith with 2L [place] at night  per the advice of his sleep doctor - at some point can check o2 level at night   [place]  - glad brother is health guardian and personal will is completed - glad living will complete  Plan  - regular updates to brother about health status - evaluate medic-alert and local support neighbors/friends  - local support group leader is Marlane Mingle- [email or web address] - refer home palliative care support  Follow-up  - 8-10 weeks face to face with Dr Chase Caller - 30 min slot after HRCT

## 2021-12-09 ENCOUNTER — Telehealth: Payer: Self-pay

## 2021-12-09 NOTE — Telephone Encounter (Signed)
Attempted to contact patient to schedule a Palliative Care consult appointment. No answer left a message to return call.  

## 2021-12-10 ENCOUNTER — Other Ambulatory Visit: Payer: Self-pay | Admitting: Family Medicine

## 2021-12-13 ENCOUNTER — Other Ambulatory Visit: Payer: Medicare Other

## 2021-12-13 ENCOUNTER — Other Ambulatory Visit: Payer: Self-pay

## 2021-12-13 ENCOUNTER — Telehealth: Payer: Self-pay | Admitting: Physician Assistant

## 2021-12-13 DIAGNOSIS — R0609 Other forms of dyspnea: Secondary | ICD-10-CM | POA: Diagnosis not present

## 2021-12-13 DIAGNOSIS — R7989 Other specified abnormal findings of blood chemistry: Secondary | ICD-10-CM | POA: Diagnosis not present

## 2021-12-13 NOTE — Telephone Encounter (Signed)
New message    Patient had labs drawn today and came by check out to let the nurse know that she has been trying to reach her to return her call to get lab results.  No one would answer and she had long wait times.  Patient stated that she talked to someone via mychart and she will talk to the nurse when they call her with today's results.  No need for callback just let nurse know that she tried to return her call.

## 2021-12-14 ENCOUNTER — Encounter (HOSPITAL_BASED_OUTPATIENT_CLINIC_OR_DEPARTMENT_OTHER): Payer: Self-pay

## 2021-12-14 ENCOUNTER — Telehealth: Payer: Self-pay

## 2021-12-14 ENCOUNTER — Other Ambulatory Visit: Payer: Medicare Other

## 2021-12-14 LAB — T4, FREE: Free T4: 1.01 ng/dL (ref 0.82–1.77)

## 2021-12-14 LAB — TSH: TSH: 0.667 u[IU]/mL (ref 0.450–4.500)

## 2021-12-14 NOTE — Telephone Encounter (Signed)
Spoke with patient and scheduled an in-person Palliative Consult for 01/11/22 @ 1:30 PM with Dr. Hollace Kinnier. Documentation will be noted in [facility].   COVID screening was negative. No pets in home. Patient lives alone.  Consent obtained; updated Outlook/Netsmart/Team List and Epic.   Patient is aware she may be receiving a call from provider the day before or day of to confirm appointment.

## 2021-12-20 ENCOUNTER — Encounter: Payer: Self-pay | Admitting: Family Medicine

## 2021-12-20 ENCOUNTER — Other Ambulatory Visit: Payer: Self-pay | Admitting: Family Medicine

## 2021-12-21 ENCOUNTER — Other Ambulatory Visit: Payer: Self-pay

## 2021-12-21 MED ORDER — CLONAZEPAM 0.5 MG PO TABS: ORAL_TABLET | ORAL | 1 refills | Status: DC

## 2021-12-21 NOTE — Telephone Encounter (Signed)
Last Refill 03/07/2021 Last OV 02/01/2021 dx ILD

## 2022-01-02 ENCOUNTER — Encounter: Payer: Self-pay | Admitting: Internal Medicine

## 2022-01-04 ENCOUNTER — Encounter: Payer: Self-pay | Admitting: Internal Medicine

## 2022-01-05 NOTE — Telephone Encounter (Signed)
Good Morning, We have got your CT appt scheduled over at [facility] for 01/28/2022 arrive at 1:40 there is no prep for this type of Ct but they do have a no Show policy please call them if you need to cancel. Thanks

## 2022-01-11 ENCOUNTER — Other Ambulatory Visit: Payer: Self-pay | Admitting: Family Medicine

## 2022-01-11 ENCOUNTER — Other Ambulatory Visit: Payer: Self-pay

## 2022-01-11 DIAGNOSIS — Z515 Encounter for palliative care: Secondary | ICD-10-CM | POA: Diagnosis not present

## 2022-01-11 DIAGNOSIS — I272 Pulmonary hypertension, unspecified: Secondary | ICD-10-CM | POA: Diagnosis not present

## 2022-01-11 DIAGNOSIS — J9611 Chronic respiratory failure with hypoxia: Secondary | ICD-10-CM | POA: Diagnosis not present

## 2022-01-11 DIAGNOSIS — J849 Interstitial pulmonary disease, unspecified: Secondary | ICD-10-CM | POA: Diagnosis not present

## 2022-01-11 MED ORDER — FLUOXETINE HCL 20 MG PO CAPS: 20.0000 mg | ORAL_CAPSULE | Freq: Every day | ORAL | 0 refills | Status: DC

## 2022-01-28 ENCOUNTER — Ambulatory Visit
Admission: RE | Admit: 2022-01-28 | Discharge: 2022-01-28 | Disposition: A | Discharge status: Home / Self Care | Payer: Medicare Other | Source: Ambulatory Visit | Attending: Internal Medicine | Admitting: Internal Medicine

## 2022-01-28 ENCOUNTER — Other Ambulatory Visit: Payer: Medicare Other

## 2022-01-28 DIAGNOSIS — J479 Bronchiectasis, uncomplicated: Secondary | ICD-10-CM | POA: Diagnosis not present

## 2022-01-28 DIAGNOSIS — J849 Interstitial pulmonary disease, unspecified: Secondary | ICD-10-CM | POA: Diagnosis not present

## 2022-01-28 DIAGNOSIS — I2510 Atherosclerotic heart disease of native coronary artery without angina pectoris: Secondary | ICD-10-CM | POA: Diagnosis not present

## 2022-01-28 DIAGNOSIS — J432 Centrilobular emphysema: Secondary | ICD-10-CM | POA: Diagnosis not present

## 2022-01-31 ENCOUNTER — Other Ambulatory Visit: Payer: Self-pay

## 2022-01-31 ENCOUNTER — Telehealth: Payer: Medicare Other | Admitting: Family Medicine

## 2022-01-31 ENCOUNTER — Ambulatory Visit (INDEPENDENT_AMBULATORY_CARE_PROVIDER_SITE_OTHER): Payer: Medicare Other | Admitting: Family Medicine

## 2022-01-31 ENCOUNTER — Encounter: Payer: Self-pay | Admitting: Family Medicine

## 2022-01-31 VITALS — BP 128/79 | HR 73 | Temp 98.7°F | Ht 65.0 in | Wt 266.6 lb

## 2022-01-31 DIAGNOSIS — J849 Interstitial pulmonary disease, unspecified: Secondary | ICD-10-CM | POA: Diagnosis not present

## 2022-01-31 DIAGNOSIS — F4322 Adjustment disorder with anxiety: Secondary | ICD-10-CM | POA: Diagnosis not present

## 2022-01-31 DIAGNOSIS — J439 Emphysema, unspecified: Secondary | ICD-10-CM | POA: Diagnosis not present

## 2022-01-31 DIAGNOSIS — J8410 Pulmonary fibrosis, unspecified: Secondary | ICD-10-CM | POA: Diagnosis not present

## 2022-01-31 DIAGNOSIS — R7303 Prediabetes: Secondary | ICD-10-CM

## 2022-01-31 DIAGNOSIS — K50919 Crohn's disease, unspecified, with unspecified complications: Secondary | ICD-10-CM

## 2022-01-31 DIAGNOSIS — G479 Sleep disorder, unspecified: Secondary | ICD-10-CM

## 2022-01-31 DIAGNOSIS — I2723 Pulmonary hypertension due to lung diseases and hypoxia: Secondary | ICD-10-CM

## 2022-01-31 DIAGNOSIS — Z1231 Encounter for screening mammogram for malignant neoplasm of breast: Secondary | ICD-10-CM | POA: Diagnosis not present

## 2022-01-31 LAB — POCT GLYCOSYLATED HEMOGLOBIN (HGB A1C): Hemoglobin A1C: 6.2 % — AB (ref 4.0–5.6)

## 2022-01-31 MED ORDER — CLONAZEPAM 0.5 MG PO TABS: 0.2500 mg | ORAL_TABLET | Freq: Every day | ORAL | 1 refills | Status: DC | PRN

## 2022-01-31 MED ORDER — FLUOXETINE HCL 20 MG PO CAPS: 20.0000 mg | ORAL_CAPSULE | Freq: Every day | ORAL | 3 refills | Status: DC

## 2022-01-31 MED ORDER — SHINGRIX 50 MCG/0.5ML IM SUSR: 0.5000 mL | Freq: Once | INTRAMUSCULAR | 0 refills | Status: AC

## 2022-01-31 NOTE — Patient Instructions (Signed)
Please return in 12 months for your annual complete physical; please come fasting.   Please take the prescription for Shingrix to the pharmacy so they may administer the vaccinations. Your insurance will then cover the injections.   I have ordered a mammogram and/or bone density for you as we discussed today: _0   Mammogram  _1   Bone Density  Please call the office checked below to schedule your appointment:  _2   [facility]      [place], [place]         _3   [facility]  [place], [place]   If you have any questions or concerns, please don't hesitate to send me a message via MyChart or call the office at [phone]. Thank you for visiting with Patty [name] today! It's our pleasure caring for you.

## 2022-02-01 ENCOUNTER — Other Ambulatory Visit: Payer: Self-pay

## 2022-02-01 ENCOUNTER — Encounter: Payer: Self-pay | Admitting: Internal Medicine

## 2022-02-01 ENCOUNTER — Ambulatory Visit (INDEPENDENT_AMBULATORY_CARE_PROVIDER_SITE_OTHER): Payer: Medicare Other | Admitting: Internal Medicine

## 2022-02-01 VITALS — BP 128/64 | HR 82 | Temp 98.1°F | Ht 65.0 in | Wt 266.0 lb

## 2022-02-01 DIAGNOSIS — J849 Interstitial pulmonary disease, unspecified: Secondary | ICD-10-CM

## 2022-02-01 DIAGNOSIS — I2723 Pulmonary hypertension due to lung diseases and hypoxia: Secondary | ICD-10-CM | POA: Diagnosis not present

## 2022-02-01 DIAGNOSIS — J9611 Chronic respiratory failure with hypoxia: Secondary | ICD-10-CM

## 2022-02-01 NOTE — Patient Instructions (Addendum)
Chronic hypoxemic respiratory failure due to all of the below ILD (interstitial lung disease) ([place]) - progressive phenotype diagnosed  WHO group 3 pulmonary arterial hypertension ([place]) with elevaed PCWP to 20 on Dec 2021   L:ung function declined 17% in 3 years on FVC, fev1 and dlco. So finding it a bit odd that radiologist in 2023 feels you do not have ILD but have what is called ILA (interst lung abnormaliteis)   Glad rechallenge tyvaso is working well - currently at 4x/day - using 9 times each time and exercise tolerance better  Glad no more sewage exposure   Plan - continue tyvaso and slowly increase dose --Continue oxygen 3-6 L nasal cannula pulsed with exertion and at night 2 L nasal cannula and room air at rest -Continue exercise program  -However continue to keep her pulse ox at greater than or equal to 88% using a pulse oximeter --Make attempts to lose weight - Ge HRCT supine and prone 2 months - will discuss I our MDD case conference - do spirometry/dlco in 6 months  OSA on CPAP  Stable  Plan  -Continue CPAP at ngith with 2L [place] at night  per the advice of his sleep doctor - at some point can check o2 level at night   Associated emphysema on CT  Plan  - supporitive care   Follow-up  - 6 months s face to face with Dr Chase Caller but afte PFT; 30 min visit   -

## 2022-02-01 NOTE — Progress Notes (Addendum)
_0  ID: [name], female    DOB: 1951/10/30, 71 y.o.   MRN: [id number]  Chief Complaint  Patient presents with   Follow-up    dyspnea     Referring provider: No ref. provider found  HPI: 71 year old female former smoker seen for pulmonary consulokay guarded because then maybe they can meet him right now for the registryt 05/09/2017 for progressive dyspnea since 2004.[name] Patient has polycythemia vera followed by hematology and [place] She has Crohn's disease    IOV  05/09/2017  Chief Complaint  Patient presents with   Pulmonary Consult    Pt referred for SOB with activity x years. Pt states over the last year the DOE has worsened. Pt denies cough and CP/tightness and f/c/s.     71 year old obese female. In 2004 Started noticing insidious onset of shortness of breath. In 2005 more [place] from [place] and probably gain over 60 pounds of weight. She has extensive workup at that time according to her history. She was then diagnosed with polycythemia by Dr. [name] at [facility]. She does not recollect any phlebotomies for this. However in the last 4-5 years she's had worsening shortness of breath. Noticeable with exertion particularly in the gym and doing standing exercises but not so much with sitting exercises. Relieved by rest. When she does some yard work she notices some associated wheezing as well.  Walking desaturation test in the office she did desaturate to 88% and did get tachycardic. She says that a chest x-ray done by primary care physicians interstitial pulmonary fibrosis. I personally visualized the chest x-ray done 04/03/2017: There is some interstitial markings but it is obscured by her obesity. I'm not so certain that is definite ILD. There is no lab work in the record. There is no PFT a CT scan available for visualization     05/29/2017 Follow up : Dyspnea  Patient returns for a two-week follow-up. Patient was seen for pulmonary consult  05/09/2017 for progressive dyspnea since 2004. Patient was set up for a high resolution CT chest that showed a very mild basilar predominant subpleural reticulation and bronchiectasis which could be due to nonspecific interstitial pneumonitis.. CT did have incidental findings of enlarged pulmonary arteries and aortic and coronary calcification. Patient had pulmonary function test on May 23 that showed an FEV1 at 77%, ratio 73, FVC 81, no significant bronchodilator response, total lung capacity 100%, DLCO 52%. Overnight oximetry test did show significant desaturations. We discussed beginning oxygen at bedtime.  Patient denies significant cough. Says that she get short of breath with walking. Has daytime fatigue and low energy..  Interstitial lung disease patient questionnaire was completed as follows Patient has no previous use of Macrodantin, amiodarone, methotrexate. She has been treated with prednisone for her Crohn's in the past. Patient has had extensive travel with brief visits to multiple state and country's.. [place]. Did live in a apartment that she did notice mold for 4 years. She does have Crohn's disease and was on Mesamaline for many years. Patient says she has had a humidifier and hot tub and previous house. She has no indoor birds. She does have a dog. Patient says she was told that she had a blood clot in the past but was never found.   OV 08/15/2017  Chief Complaint  Patient presents with   Follow-up    Pt still gets occ. SOB. Other than that, pt states that she has been doing good.  Denies any cough or CP.     Follow-up multifactorial dyspnea with interstitial lung disease concern.   Regarding interstitial lung disease concern: Mid May 2018 she had high resolution CT chest that showed interstitial lung disease very mild but unclear if possible UIP pattern. Autoimmune test was negative. Isolated reduction in diffusion capacity 50%. [employer] of chest  physicians questionnaire shows previous Crohn's disease and also mold exposure previously. Overall she feels stable  Regarding dyspnea: She underwent echocardiogram in June 2018 showed grade 1 diastolic dysfunction. Had abnormal cardiac stress test July 2018 followed by cardiac cath early August 2018 that showed normal coronary angiogram but elevated left ventricular end-diastolic pressure. Dietary therapy has been recommended but she is unaware of these results.  Overall she is here to discuss these results.  IMPRESSION: 1. Suspect very mild basilar predominant subpleural reticulation and bronchiolectasis, which may be due to nonspecific interstitial pneumonitis. 2. Aortic atherosclerosis (ICD10-170.0). Coronary artery calcification. 3. Enlarged pulmonary arteries, indicative of pulmonary arterial hypertension.     Electronically Signed   By: Lorin Picket M.D.   On: 05/13/2017 07:33    OV 10/17/2017  Chief Complaint  Patient presents with   Follow-up    Pt states that she has had good days and bad days since last visit. States that when she was in [place] for a month, breathing was better; still became SOB but not as bad as in [place]. Pt's SOB is mainly on exertion. Denies any cough or CP.    Follow-up dyspnea that is multifactorial due to obesity, physical deconditioning and diastolic dysfunction Follow-up mild interstitial lung disease not otherwise specified  Last visit I started her on Lasix. I was only supposed to see herin 6 months or so. However she's had problems tolerating Lasix and potassium. It fluctuates between palpitations and hypertension. She feels the palpitations might be related to potassium depletion after taking Lasix. But she also tells me that the Lasix does help her dyspnea. At this point in time her condition is to see cardiologist. Off note she did send the email message asking these questions on 09/28/2017 made a reply that she tells me that she never got  the reply.   OV 02/13/2018  Chief Complaint  Patient presents with   Follow-up    PFT done 02/05/18.  Pt states she has been doing good. Was sick in january but states she is doing better.  DME: [facility] 4L pulse    Follow-up dyspnea that is multifactorial due to obesity, physical deconditioning and diastolic dysfunction Follow-up mild interstitial lung disease not otherwise specified v concern   71 year old female with concern for interstitial lung disease.  Since her last visit she continues to do well.  She is attending pulmonary rehabilitation.  She feels better and less short of breath.  She uses oxygen with exertion at rehab saying she needs it.  She has upcoming travel in summer to [place] and wanted some flight information oxygen form filled out.  She is wondering if mesalamine that she took several years ago was the cause of interstitial lung disease or her Crohn's disease.  But overall she is stable.  Pulmonary function test shows stability and FVC since last 1 year with some improvement in DLCO.  High-resolution CT scan of the chest interpreted by thoracic radiology report suspected ILD.   OV 08/28/2018  Chief Complaint  Patient presents with   Follow-up    PFT performed today.  Pt states she has had both good and bad  days since last visit. Pt denies any complaints of cough, SOB, or CP but states she is having some problems with congestion since she has been back from vacation. Pt has been having problems with getting O2 supplies taken care of with DME.    Follow-up dyspnea that is multifactorial due to obesity, physical deconditioning and diastolic dysfunction Follow-up concern for  mild interstitial lung disease not otherwise specified  [name] - Presents for follow-up for the above issues. Her dyspnea significantly better following a 30 pound weight loss and continued rehabilitation. She uses oxygen with rehabilitation. Today walking desaturation test 185 feet 3 laps on room  air: She dropped to 87% on the second lap. But she is feeling better. She had spirometry today that shows improvement in FVC concomitant with weight loss but no change in diffusion capacity that reflects the presence of ongoing possible ILD.She continues to lose more weight she believes that she could lose another 30 pounds before the next visit.     OV 03/12/2019  Subjective:  Patient ID: [name], female , DOB: 1951-01-05 , age 59 y.o. , MRN: [id number] , ADDRESS: [place] [id number]   03/12/2019 -   Chief Complaint  Patient presents with   Follow-up    PFT performed today.  Pt states she has been doing good since last visit. States she still becomes SOB with exertion, and has phlegm in the mornings, postnasal drainage which has used flonase to help. Pt also uses POC as needed.    Follow-up dyspnea that is multifactorial due to obesity, physical deconditioning and diastolic dysfunction Follow-up  mild interstitial lung disease not otherwise specifieds -with stability 2018 through 2020 [non-IPF pattern]; on observation   HPI Keiyana Stehr 71 y.o. -presents for follow-up.  She is attending pulmonary rehabilitation and using oxygen with exertion.  Overall she feels stable.  Subjective symptom parameters and objective parameters are documented below.  She had high-resolution CT chest that showed shows that she has ILD.  At this point we can be confident that she has mild ILD but it is stable based on symptoms walking test and pulmonary function test.  The previous autoimmune test was negative.      OV 10/07/2019  Subjective:  Patient ID: [name], female , DOB: 1951-08-11 , age 9 y.o. , MRN: [id number] , ADDRESS: [place] [id number]   10/07/2019 -   Chief Complaint  Patient presents with   Follow-up     Follow-up dyspnea that is multifactorial due to obesity, physical deconditioning and diastolic dysfunction Follow-up  mild  interstitial lung disease not otherwise specifieds -with stability 2018 through 2020 [non-IPF pattern]; on observation Follow-up mild pulmonary emphysema present on high-resolution CT chest  HPI Faduma Cho 71 y.o. -presents for follow-up.  Prior visit was pre-pandemic.  Since then she has been sedentary at home.  She tells me that she is more short of breath.  The symptom scores below reflect that.  She thinks is because of the 30 pound weight gain because of sedentary living and social isolation following the onset of the pandemic.  She is frustrated by this.  Deep down she thinks her ILD is stable.  Pulmonary function test shows a drop in FVC but stability and DLCO suggesting weight gain is an ongoing issue for worsening dyspnea and pulmonary function test.  She is willing to have a high-dose flu shot today.  She also tells me that she is not taking her  Spiriva because it was expensive.  Although it did help her she is willing to try again and try to price it.  She also stated that her mom who never smoked had emphysema but her dad smoked heavily.  She has never been tested for alpha-1.   Rhea -dropped. But tries not to use it in public    IMPRESSION: 1. Very mild scattered basilar subpleural reticulation and ground-glass similar to 01/29/2018. Nonspecific interstitial pneumonitis can not be excluded. Findings are indeterminate for UIP per consensus guidelines: Diagnosis of Idiopathic Pulmonary Fibrosis: An Official ATS/ERS/JRS/ALAT Clinical Practice Guideline. [place], Iss 5, [id number], Aug 26 2017. 2. Question cirrhosis. 3. Aortic atherosclerosis (ICD10-170.0). Coronary artery calcification. 4. Enlarged arteries, arterial hypertension. 5.  Emphysema (ICD10-J43.9).     Electronically Signed   By: Lorin Picket M.D.   On: 03/06/2019 16:59   ROS - per HPI     OV 06/23/2020  Subjective:  Patient ID: [name], female ,  DOB: 1951/04/12 , age 88 y.o. , MRN: [id number] , ADDRESS: [address] [place] [address]   06/23/2020 -   Chief Complaint  Patient presents with   Follow-up    shortness of breath with exertion   Follow-up dyspnea that is multifactorial due to obesity, physical deconditioning and diastolic dysfunction  Follow-up  mild interstitial lung disease not otherwise specifieds -with stability 2018 through 2020 [non-IPF pattern/indeterminate   -; on observation  - last CT Mach 2020 0> July 2021 wthout progression  Follow-up mild pulmonary emphysema present on high-resolution CT chest - alpha 1 MM  - o2 with exertion  - advised spiriva oct 2020 but too expensive  HPI Ahsley Attwood 70 y.o. -returns for routine follow-up.  She says she has been doing well.  She uses portable oxygen cranks it up to 4 L - 5 L and then walks every day for 30 minutes.  Overall she feels stable.  Her symptom score itself is stable.  However on pulmonary function testing her FVC shows a decline.  This decline started in October 2020 and since then it is stable.  She attributes this to weight gain.  There is what she told me last time as well.  However when we walked her she seemed to desaturate much more easily.  We also needed to corrected desaturation at 5 L.  She thinks it is because the portable oxygen system is not delivering oxygen currently and is a technical issue with a portable machine.  However the distance of desaturation seems to worsen.  There is no leg swelling although she has some mild varicose veins at baseline.  She is not on nighttime oxygen and she recollects attest that this some years ago and was normal back then.  Currently she is only using portable oxygen.  Her last echocardiogram was few years ago.  There is no hemoptysis or worsening cough.  She got a Covid vaccine in February 2021 and she thinks things might of changed since then.   After she left I was able to review the home  palliative care note from 01/11/2022:.  She expressed her life wishes that she does not want to be put on the ventilator.  Does not want a feeding tube.  She likes to pass away peacefully like her mother a DNR and MOST form were given.  They will address this in the future with her.  Results for MAYMIE, BRUNKE ANN (MRN [id number]) as of  06/23/2020 11:49  Ref. Range 05/17/2017 13:11 02/05/2018 12:14 08/28/2018 11:22 03/12/2019 14:47 10/01/2019 15:58 30 pound weight gain 06/09/2020 11:25  FVC-Pre Latest Units: L 2.61 2.57 2.80 2.73 2.38 2.42  FVC-%Pred-Pre Latest Units: % 84 83 94 91 81 82  FEV1-Pre Latest Units: L 1.83 1.71 1.96 1.83 1.65 1.53   Results for ORIAN, AMBERG ANN (MRN [id number]) as of 06/23/2020 11:49  Ref. Range 05/17/2017 13:11 02/05/2018 12:14 08/28/2018 11:22 03/12/2019 14:47 10/01/2019 15:58 06/09/2020 11:25  DLCO unc Latest Units: ml/min/mmHg 12.64 15.15 14.53 13.77 14.39 13.48  DLCO unc % pred Latest Units: % 52 62 62 72 75 70    ROS - per HPI     OV 02/23/2021  Subjective:  Patient ID: [name], female , DOB: 30-Jul-1951 , age 77 y.o. , MRN: [id number] , ADDRESS: [place] [id number] PCP [name], MD Patient Care Team: [name], MD as PCP - General (Family Medicine) Pyrtle, Lajuan Lines, MD as Consulting Physician (Gastroenterology) Shamleffer, Melanie Crazier, MD as Consulting Physician (Endocrinology) Brand Males, MD as Consulting Physician (Pulmonary Disease)  This Provider for this visit: Treatment Team:  Attending Provider: Brand Males, MD   Follow-up dyspnea that is multifactorial due to obesity, physical deconditioning and diastolic dysfunction, ILD and new dx Group 3 PAH - dec 2021  45 pppd prior smoking hix  Follow-up  mild interstitial lung disease not otherwise specifieds -with stability 2018 through 2020 [non-IPF pattern/indeterminate   -; on observation  - last CT Mach 2020 - > July 2021 without  progression  Inconsisent dxx of  pulmonary emphysema present on high-resolution CT chest - alpha 1 MM  - o2 with exertion  - advised spiriva oct 2020 but too expensive  Reported in only 1 CT. No mention in July 2021 CT  WHO group 3 Pulm htn with elevated PCWD - [facility] 12/07/20:   RA 13/12 mean 9 mmHg RV 48/8, EDP 12 PA 50/29 mean 38 PCWP 22/20 mean 20   O2 saturations: PA 62, Ao 97, SVC 64   CO 4.26 L/m, CI 1.96 Trans-pulmonic gradient 18 mmHg PVR 4.2 Woods Units  Grade 1 Disast dysfhn  - reportred July 2021 echo  Obesity  02/23/2021 -   Chief Complaint  Patient presents with   Follow-up    Doing ok     HPI Keva Darty 71 y.o. -returns for 76-monthfollow-up.  She has inconsistent diagnosis of emphysema.  Was reported in 1 scan but not another scans.  She has ILD.  She is foregoing biopsy.  She tells me overall that she is stable.  Although there are some weight gain.  She feels in the interim Metformin messed up her GI system and cause weight gain but she is glad to be out of it.  She had 6-minute walk test on 02/15/2021 at pulmonary rehabilitation.  She walked 7 and 15 feet in 6 minutes.  She did not break at all.  Her pulse ox dropped below 88% 2 times.  She corrected with 4 L of oxygen.  Her resting oxygen saturation was 96%.  And lowest was 86%.  She also had right heart catheterization in December 2021.  She has elevated pulmonary pressures but also elevated wedge.  Cardiology is following this up and she is on diuresis.  We spent some time discussing her care option.  She had pulmonary function test that shows stability in the last year and a half but declined in 2 years  time.  Her high-resolution CT scan of the chest done in summer 2021 shows stability over 4 years.  She is not interested in antifibrotic's because of her colitis and also given her stability.  She does not want to go through pulmonary lung biopsy given the risks  She generally tries to avoid  medications.  She is happy going to pulmonary rehabilitation.  We discussed inhaled treprostinil as approved therapy for pulmonary hypertension -expressed to her the overall safety profile for over 20 years and approved recently for pulmonary hypertension and WHO group 3.  Explained the minimal side effect risk but she does not want to go through this  We discussed the option of participating in a clinical trial.  We discussed what is called the PULSE inhaled nitric oxide device study.  This is a 28-week plus study.  It was a device and a nitric oxide supply study.  She is actually interested in this.  She is participating clinical trials.  She understands the voluntary nature of this.  She understands the risks that come with clinical trials.  She understands the control of risk through close follow-up and interventions..  However her 6-minute walk test is quite adequate and also she starting pulmonary rehabilitation so she would not be able to participate in this trial for several months.  In addition she is aware that inhaled treprostinil which is standard of care therapy is currently on exclusion in this protocol current amendment.  She understands if she qualifies and chooses this trial that this would be a limitation.  We have given her the consent for review but at this time she does not qualify for this trial.  HRCT July 2021   IMPRESSION: 1. Spectrum of findings suggestive of a mild basilar predominant fibrotic interstitial lung disease without appreciable interval progression since baseline 05/12/2017 high-resolution chest CT. Favor NSIP, with UIP not excluded. Findings are indeterminate for UIP per consensus guidelines: Diagnosis of Idiopathic Pulmonary Fibrosis: An Official ATS/ERS/JRS/ALAT Clinical Practice Guideline. [place], Iss 5, [phone], Aug 26 2017. 2. Dilated main pulmonary artery, stable, suggesting chronic pulmonary arterial hypertension. 3. Aortic  Atherosclerosis (ICD10-I70.0).     Electronically Signed   By: Ilona Sorrel M.D.   On: 07/10/2020 15:50     OV 05/31/2021  Subjective:  Patient ID: [name], female , DOB: Jun 16, 1951 , age 57 y.o. , MRN: [id number] , ADDRESS: [place] [id number] PCP [name], MD Patient Care Team: [name], MD as PCP - General (Family Medicine) Pyrtle, Lajuan Lines, MD as Consulting Physician (Gastroenterology) Shamleffer, Melanie Crazier, MD as Consulting Physician (Endocrinology) Brand Males, MD as Consulting Physician (Pulmonary Disease)  This Provider for this visit: Treatment Team:  Attending Provider: Brand Males, MD    Follow-up dyspnea that is multifactorial due to obesity, physical deconditioning and diastolic dysfunction, ILD and new dx Group 3 PAH - dec 2021  45 pppd prior smoking hix  Follow-up  mild interstitial lung disease not otherwise specifieds -with stability 2018 through 2020 [non-IPF pattern/indeterminate   -on observation  -last CT Mach 2020 - > July 2021 without progression  Inconsisent dxx of  pulmonary emphysema present on high-resolution CT chest - alpha 1 MM  - o2 with exertion  - advised spiriva oct 2020 but too expensive  Reported in only 1 CT. No mention in July 2021 CT  WHO group 3 Pulm htn with elevated PCWD - [facility] 12/07/20:  RA 13/12 mean 9 mmHg RV 48/8, EDP 12 PA 50/29 mean 38 PCWP 22/20 mean 20   O2 saturations: PA 62, Ao 97, SVC 64   CO 4.26 L/m, CI 1.96 Trans-pulmonic gradient 18 mmHg PVR 4.2 Woods Units  Grade 1 Disast dysfhn  - reportred July 2021 echo  Obesity   05/31/2021 -   Chief Complaint  Patient presents with   Follow-up    Pt states she has been doing okay since last visit. States she has begun pulmonary rehab which has helped.     HPI Jodeci Rini 71 y.o. -returns for follow-up.  She continues on supportive care with oxygen.  Room air at rest 3-5 L pulsed with  oxygen.  For the last few months she is on pulsed oxygen.  She uses 2 L nasal cannula at night with her CPAP.  She is completed pulmonary rehabilitation.  For the last few months she is using pulsed oxygen.  She feels this works better.  She is [facility] [employer] [facility].  She does elliptical or treadmill.  She feels her symptoms have improved.  She is continue to gain some weight though.  She again reported that she feels sensitive to medication and wants avoid antifibrotic's.  She was interested this time and discussing treprostinil versus pulsed research protocol for patients with interstitial lung disease and hypoxemic respiratory failure.  She has pulmonary hypertension based on 2021 December right heart catheterization.  We discussed the long-term safety profile of inhaled treprostinil.  Discussed the side effect profile.  After reflection about the fact the increase study showed improvement in 6-minute walk test, subgroup analysis potential modification of ILD and other composite outcome improvement.     IMPRESSION: 1. Spectrum of findings suggestive of a mild basilar predominant fibrotic interstitial lung disease without appreciable interval progression since baseline 05/12/2017 high-resolution chest CT. Favor NSIP, with UIP not excluded. Findings are indeterminate for UIP per consensus guidelines: Diagnosis of Idiopathic Pulmonary Fibrosis: An Official ATS/ERS/JRS/ALAT Clinical Practice Guideline. [place], Iss 5, [phone], Aug 26 2017. 2. Dilated main pulmonary artery, stable, suggesting chronic pulmonary arterial hypertension. 3. Aortic Atherosclerosis (ICD10-I70.0).     Electronically Signed   By: Ilona Sorrel M.D.   On: 07/10/2020 15:50   OV 09/07/2021  Subjective:  Patient ID: [name], female , DOB: Aug 23, 1951 , age 13 y.o. , MRN: [id number] , ADDRESS: [place] [phone] PCP [name], MD Patient Care  Team: [name], MD as PCP - General (Family Medicine) Hilarie Fredrickson, Lajuan Lines, MD as Consulting Physician (Gastroenterology) [facility], Melanie Crazier, MD as Consulting Physician (Endocrinology) Brand Males, MD as Consulting Physician (Pulmonary Disease)  This Provider for this visit: Treatment Team:  Attending Provider: Brand Males, MD  Chronic hypoxemic respiratory failure due to all of the below ILD (interstitial lung disease) [facility])  -  Decline PF March 2020 through February 2022  but stable October 2020 through February 2022 on pulmonary function test -Stable on CT scan of the chest 2018 through July 2021 -Non-IPF likely - such as NSIP -Overall supportive care of plan without antifibrotic's given history of colitis [antifibrotic's can exacerbate this situation] and prior stability -You are using room air at rest with 3-5 L with exertion -pulse oxygen for the last few months    WHO group 3 pulmonary arterial hypertension ([place]) with elevaed PCWP to 20 on Dec 2021 - started tyvaso July 2022  [place] group 3  pulmonary arterial hypertension ([place]) with elevaed PCWP to 20 on Dec 2021 - -Continue CPAP at ngith with 2L [place] at night  09/07/2021 -   Chief Complaint  Patient presents with   Follow-up    PFT performed today.  States that she still becomes SOB with exertion. Pt is now on Tyvaso and states she finds herself needing her O2 more.     HPI Shamikia Linskey 71 y.o. -returns for follow-up.  She started treprostinil in July 2022.  She says initially started doing better.  But once she got to 7 puffs 4 times daily she started getting more short of breath.  She felt overall that she is able to take a deep breath and feel less fatigued but when she got to that dose she said 1 time she walked across the room without oxygen which she normally walks without oxygen and she felt dizzy and winded.  Since then the symptoms have persisted.  She is also gained significant amount of  weight.  She is gained 16 pounds.  Her pulmonary function test is worse with a decline in both FVC and DLCO.  She says she is now needing 6 L of nasal cannula oxygen when she exercises.  Previously 5 L nasal cannula.  Symptom score wise and walking desaturation test wise she seems the same.  She does not want to do antifibrotic's because of prior history of colectomy.  At this point she is not sure if the treprostinil is helping her or hurting her or not doing anything.  OV 10/19/2021  Subjective:  Patient ID: [name], female , DOB: August 27, 1951 , age 49 y.o. , MRN: [id number] , ADDRESS: [place] [id number] PCP [name], MD Patient Care Team: [name], MD as PCP - General (Family Medicine) Hilarie Fredrickson, Lajuan Lines, MD as Consulting Physician (Gastroenterology) [facility], Melanie Crazier, MD as Consulting Physician (Endocrinology) Brand Males, MD as Consulting Physician (Pulmonary Disease)  This Provider for this visit: Treatment Team:  Attending Provider: Brand Males, MD   Chronic hypoxemic respiratory failure due to all of the below ILD (interstitial lung disease) ([place]) - 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 - diagnosed Sept 2022 -Non-IPF likely - such as NSIP -  Decline PF March 2020 through February 2022  but stable October 2020 through February 2022  -> declined sept 2022 PFT -Stable on CT scan of the chest 2018 through July 2021 -You are using room air at rest with 3-5 L with exertion -pulse oxygen for the last few months - started esbriet     WHO group 3 pulmonary arterial hypertension ([place]) with elevaed PCWP to 20 on Dec 2021 - started tyvaso July 2022 -> stopped sept 2022 of concern PFT declined could ave been due to tyvaso -> but restarted later in sept 2022   [place] group 3 pulmonary arterial hypertension ([place]) with elevaed PCWP to 20 on Dec 2021 - -Continue CPAP at ngith with 2L [place] at night   10/19/2021 -  followup  ILD   Type of visit: Telephone/Video Circumstance: COVID-19 national emergency Identification of patient Lizabeth Fellner with Nov 21, 1951 and MRN [id number] - 2 person identifier Risks: Risks, benefits, limitations of telephone visit explained. Patient understood and verbalized agreement to proceed Anyone else on call: just patient Patient location: [phone] - her cell.  At her home This provider location: [place] 71 y.o. - esbriet still pending. BAck on tyvaso since  mid-late sept 2022. Now doing it 4x/day - 5x/each time.   Also reports sewage leak since April/May 2022 Occassional odor. Some leakage through cleanup pipe. FEels this has made her respiration worse instead of tyvaso. Initialy she thought was tyvaso but later realized might have been due to sweage leak and finally fixed in sept 2022.She feels after long term stability things got worse because of sweage leak. She feels since leak fixed (Same time went back on tyvaso) she is better. In gym mets were down to 2.1 but now upto 2.4 mets (baseline 2.7 mets). NExt visit with mid dec 2022.   She wants to know if progression is perrmanent  Esbriet sill in process. Arriving in 2 days. She wil start at 1 pill tid x 1 week -> 2 pill tid and maintain  She can do visit 11/04/21 - 3.00pm - with [name] - video to assess followup on esbriet    OV 11/11/21 - video APP  71 yo female former smoker followed for ILD and Emphysema  Medical history significant for Crohn's Disease and DCHF   Today's video visit is a 1 month follow up for ILD . She has recently started on Esbriet 2 weeks ago, does notice some symptoms of fatigue , joint aches and fatigue. Started on Esbriet 267 mg 2 tab Twice daily, this week. No v/d. Some nausea . No bloody stools. Some intermittent pain under right ribs on/off. No urinary issues. No syncope.  Remains on Tyvaso Four times a day   Remains on Oxygen 6l/m with activity and  2l/m At bedtime  . None at rest  No change in O2 requirements   OV 12/07/2021  Subjective:  Patient ID: [name], female , DOB: 1951-09-25 , age 63 y.o. , MRN: [id number] , ADDRESS: [place] [id number] PCP [name], MD Patient Care Team: [name], MD as PCP - General (Family Medicine) Josue Hector, MD as PCP - Cardiology (Cardiology) Pyrtle, Lajuan Lines, MD as Consulting Physician (Gastroenterology) [facility], Melanie Crazier, MD as Consulting Physician (Endocrinology) Brand Males, MD as Consulting Physician (Pulmonary Disease)  This Provider for this visit: Treatment Team:  Attending Provider: Brand Males, MD    12/07/2021 -   Chief Complaint  Patient presents with   Follow-up    PFT performed 12/12. Pt states she began Esbriet November 2022 and said that she has had different side effects from the med.    Chronic hypoxemic respiratory failure due to all of the below ILD (interstitial lung disease) ([place]) - 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 - diagnosed Sept 2022 -Non-IPF likely - such as NSIP -  Decline PF March 2020 through February 2022  but stable October 2020 through February 2022  -> declined sept 2022 PFT -Stable on CT scan of the chest 2018 through July 2021 -You are using room air at rest with 3-5 L with exertion -pulse oxygen for the last few months - started esbriet  oct/nov 2022 - stopped GI intolerance 11/29/21    WHO group 3 pulmonary arterial hypertension ([place]) with elevaed PCWP to 20 on Dec 2021 - started tyvaso July 2022 -> stopped sept 2022 of concern PFT declined could ave been due to tyvaso -> but restarted later in sept 2022   WHO group 3 pulmonary arterial hypertension ([place]) with elevaed PCWP to 20 on Dec 2021 - -Continue CPAP at ngith with 2L [place] at  HPI Orra Nolde 71 y.o. -at last visit we started  pirfenidone but as soon as she went up to 2 pills 3 times daily she started having  significant side effects including drop in urine output therefore she stopped.  Most of the side effects are GI.  Present even at 1 pill 3 times daily.  She says stopped pirfenidone yesterday and ever since then beginning to feel better especially today.  Continues on oxygen at the same level.  Is on inhaled treprostinil nebulizer.  Currently increased it to 6 times at 4 times a day.  Slowly going to work her self up.  She held off on escalating this while she was on pirfenidone start.  We looked at her file-per registry but need clarification from radiology about whether she has traction bronchiectasis.  In addition need to know the current status of her polycythemia.  Last CT scan of the chest was in 2021.  We discussed about the fact that her ILD is progressing.  She is lives alone.  No siblings in the city parents are deceased.  No children.  She has neighbors.  She is assigned to healthcare for her 90 her brother in [place].  She says she regularly updates him.  We did not discuss CODE STATUS.  Offered home palliative care and she is willing.  She knows that she needs to get medical alert.       OV 02/01/2022  Subjective:  Patient ID: [name], female , DOB: 1951/02/21 , age 71 y.o. , MRN: [id number] , ADDRESS: [place] [id number] PCP [name], MD Patient Care Team: [name], MD as PCP - General (Family Medicine) Josue Hector, MD as PCP - Cardiology (Cardiology) Pyrtle, Lajuan Lines, MD as Consulting Physician (Gastroenterology) [facility], Melanie Crazier, MD as Consulting Physician (Endocrinology) Brand Males, MD as Consulting Physician (Pulmonary Disease)  This Provider for this visit: Treatment Team:  Attending Provider: Brand Males, MD    02/01/2022 -   Chief Complaint  Patient presents with   Follow-up    Pt states she is about the same since last visit.    Chronic hypoxemic respiratory failure due to all of the  below ILD (interstitial lung disease) ([place]) - 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 - diagnosed Sept 2022 -Non-IPF likely - such as NSIP -  Decline PF March 2020 through February 2022  but stable October 2020 through February 2022  -> declined sept 2022 PFT -Stable on CT scan of the chest 2018 through July 2021 -You are using room air at rest with 3-5 L with exertion -pulse oxygen for the last few months - started esbriet  oct/nov 2022 - stopped GI intolerance 11/29/21    WHO group 3 pulmonary arterial hypertension ([place]) with elevaed PCWP to 20 on Dec 2021 - started tyvaso July 2022 -> stopped sept 2022 of concern PFT declined could ave been due to tyvaso -> but restarted later in sept 2022   WHO group 3 pulmonary arterial hypertension ([place]) with elevaed PCWP to 20 on Dec 2021 - -Continue CPAP at ngith with 2L [place] at   [place]  HPI Marcell Pfeifer 71 y.o. -returns for follow-up.  She tells me the inhaler presently is working really well for her.  She feels that given the Crohn's disease is better.  Her exercise tolerance on the treadmill is better.  Is increased to 2.5 METS.  Her shortness of breath score is also improved.  There is normal sewage exposure in the house.  She feels  her lungs are improving.  She had a high-resolution CT scan of the chest.  Dr. Rosario Jacks the radiologist says there is ILA and not ILD.  She [as well as myself] no perplexed by this.  We looked at the CT scans ourselves.  In my view this may be early ILD.  It might be slightly worse in 2018.  Looking at lung function test status greater than 15% total decline in 3 years between 2019 and 2022.  However we do agree that the interstitial findings on the CT scan are mild.  Also she does not feel that she has significant emphysema.  She feels the CT report is over waiting the emphysema.  She says even though she smoked it was not a true pack per day.  She says the Spiriva did not help her.  Therefore she  is a little bit perplexed with the emphysema diagnosis on the CT scan.  She and I agreed that we will discuss in a case conference.  I did submit her name for this.  At this point in time she is not on antifibrotic's and prefers not to be on it.  She is content being on treprostinil.      SYMPTOM SCALE - ILD 03/12/2019  10/07/2019 30# wt gain  06/23/2020 250# 09/07/2021 266# - tyvaso since July 2022 12/07/2021 272# 02/01/2022 tyvaso slightly so much better  O2 use RA ra   2-6L 2-6L  Shortness of Breath 0 -> 5 scale with 5 being worst (score 6 If unable to do)   0 0   At rest 0 0 0 0 with o2 0 0  Simple tasks - showers, clothes change, eating, shaving 0 0  0 0 1  Household (dishes, doing bed, laundry) 0 0 0 0 4 1  Shopping 0 0 0 0 1 0  Walking at own pace 0 4.5 4._0 Walking up Stairs 1-_1 Total (40 - 48) Dyspnea Score 2 9.5 9.5 6 al with o2 14 6  How bad is your cough? 0 0 0 0 0 At the time of onset of0  How bad is your fatigue 0 1 0 _2 nausea   0 0 0 0  vomit   0 0 0 0  diarrhe   0 _3 axniety   _4 depression   0 1 1 0        Simple office walk 185 feet x  3 laps goal with forehead probe 03/12/2019  10/07/2019  06/23/2020  09/07/2021   O2 used Room air Room air Room air ra  Number laps completed 3 2nd lap 1 lap 1 lap  Comments about pace slow slow slow   Resting Pulse Ox/HR 100% and 85/min 96% and 72/min 97% and 77 97% and 76  Final Pulse Ox/HR 88% and 135/min 86% and 105/min 87% and 112/min 83 and 112/min  Desaturated </= 88% yes     Desaturated <= 3% points Yes, 12     Got Tachycardic >/= 90/min yes     Symptoms at end of test Mild dyspnea  Corrected with 5L [place]. Even with 4L [place] downt to  87% Corrected with 6LNC at 94$ for antoher 2lpas  Miscellaneous comments x  ? worse     CT Chest data  CT Chest High Resolution  Result Date: 01/31/2022 CLINICAL DATA:  Interstitial lung disease, former smoker. EXAM: CT CHEST  WITHOUT CONTRAST TECHNIQUE:  Multidetector CT imaging of the chest was performed following the standard protocol without intravenous contrast. High resolution imaging of the lungs, as well as inspiratory and expiratory imaging, was performed. RADIATION DOSE REDUCTION: This exam was performed according to the departmental dose-optimization program which includes automated exposure control, adjustment of the mA and/or kV according to patient size and/or use of iterative reconstruction technique. COMPARISON:  07/10/2020. FINDINGS: Cardiovascular: Atherosclerotic calcification of the aorta, aortic valve and coronary arteries. Pulmonic trunk is enlarged. Heart size within normal limits. No pericardial effusion. Mediastinum/Nodes: No pathologically enlarged mediastinal or axillary lymph nodes. Hilar regions are difficult to definitively evaluate without IV contrast but appear grossly unremarkable. Esophagus is grossly unremarkable. Lungs/Pleura: Centrilobular emphysema. Negative for subpleural reticulation, traction bronchiectasis/bronchiolectasis, ground-glass, architectural distortion or honeycombing. Subpleural densities and ground-glass seen on supine imaging largely clear on prone imaging. Mild bibasilar scarring and bronchiectasis. No pleural fluid. Airway is unremarkable. No air trapping. Upper Abdomen: Liver margin is slightly irregular. Visualized portions of the liver, gallbladder, adrenal glands and right kidney are otherwise unremarkable. 1.4 cm exophytic low-attenuation lesion off the interpolar left kidney is likely a cyst. Visualized portions of the spleen, pancreas, stomach and bowel are grossly. Scattered surgical clips in the abdomen. Upper abdominal lymph nodes are not enlarged by CT size criteria. Musculoskeletal: Degenerative changes in the spine. No worrisome lytic or sclerotic lesions. IMPRESSION: 1. Favor absence of interstitial lung disease based on prone imaging. 2. Bibasilar scarring and mild bronchiectasis. 3. Marginal  irregularity the liver is indicative of cirrhosis. 4. Aortic atherosclerosis (ICD10-I70.0). Coronary artery calcification. 5.  Emphysema (ICD10-J43.9). 6. Enlarged pulmonic trunk indicative pulmonary arterial hypertension. Electronically Signed   By: Lorin Picket M.D.   On: 01/31/2022 11:53      PFT  PFT Results Latest Ref Rng & Units 12/06/2021 09/07/2021 02/22/2021 06/09/2020 10/01/2019 03/12/2019 08/28/2018  FVC-Pre L 2.32 2.28 2.38 2.42 2.38 2.73 2.80  FVC-Predicted Pre % 80 79 81 82 81 91 94  FVC-Post L - - - - - - -  FVC-Predicted Post % - - - - - - -  Pre FEV1/FVC % % 60 62 63 63 69 67 70  Post FEV1/FCV % % - - - - - - -  FEV1-Pre L 1.38 1.41 1.50 1.53 1.65 1.83 1.96  FEV1-Predicted Pre % 63 65 68 68 74 80 86  FEV1-Post L - - - - - - -  DLCO uncorrected ml/min/mmHg 11.82 11.94 15.05 13.48 14.39 13.77 14.53  DLCO UNC% % 62 63 79 70 75 72 62  DLCO corrected ml/min/mmHg 11.45 11.94 27.44 12.89 - - -  DLCO COR %Predicted % 60 63 144 67 - - -  DLVA Predicted % 66 70 152 70 75 72 65  TLC L - - - - - - -  TLC % Predicted % - - - - - - -  RV % Predicted % - - - - - - -     CT Chest data  No results found.    has a past medical history of Abnormal nuclear stress test, Anxiety, Arthritis, Chronic diastolic CHF (congestive heart failure) ([place]), Crohn disease ([place]), DVT (deep venous thrombosis) ([place]), Dyspnea, Grade I diastolic dysfunction, Hyperlipidemia, ILD (interstitial lung disease) ([place]), OSA on CPAP, Pernicious anemia, Polycythemia vera ([place]), Pulmonary embolism ([place]) (2010), and Sleep disorder (01/10/2020).   reports that she quit smoking about 13 years ago. Her smoking use included cigarettes. She has a 45.00 pack-year smoking history. She has  never used smokeless tobacco.  Past Surgical History:  Procedure Laterality Date    RIGHT BREAST LUMPECTOMY WITH RADIOACTIVE SEED LOCALIZATION (Right Breast)  02/04/2021   BIOPSY  09/30/2019   Procedure: BIOPSY;  Surgeon: Jerene Bears,  MD;  Location: [facility];  Service: Gastroenterology;;   BREAST LUMPECTOMY WITH RADIOACTIVE SEED LOCALIZATION Right 02/04/2021   Procedure: RIGHT BREAST LUMPECTOMY WITH RADIOACTIVE SEED LOCALIZATION;  Surgeon: Coralie Keens, MD;  Location: [facility];  Service: General;  Laterality: Right;   CARDIAC CATHETERIZATION     COLON RESECTION  1993   COLONOSCOPY WITH PROPOFOL N/A 09/30/2019   Procedure: COLONOSCOPY WITH PROPOFOL;  Surgeon: Jerene Bears, MD;  Location: [facility];  Service: Gastroenterology;  Laterality: N/A;   HIP ARTHROPLASTY Right    x 2, initial right THA, then subsequent right THA revision    LEFT HEART CATH AND CORONARY ANGIOGRAPHY N/A 07/28/2017   Procedure: Left Heart Cath and Coronary Angiography;  Surgeon: Burnell Blanks, MD;  Location: [facility];  Service: Cardiovascular;  Laterality: N/A;   POLYPECTOMY  09/30/2019   Procedure: POLYPECTOMY;  Surgeon: Jerene Bears, MD;  Location: [facility];  Service: Gastroenterology;;   RIGHT HEART CATH N/A 12/07/2020   Procedure: RIGHT HEART CATH;  Surgeon: Sherren Mocha, MD;  Location: [facility];  Service: Cardiovascular;  Laterality: N/A;    Allergies  Allergen Reactions   Victoza [Liraglutide] Shortness Of Breath   Lasix [Furosemide] Itching    Hypersensitivity - pt can take as needed    Metformin And Related Diarrhea    Increased moodiness   Onion Other (See Comments)    sereve diarrhea, stomach pains (Crohn's flares)   Pirfenidone Other (See Comments)    GI side effects   Potassium-Containing Compounds Other (See Comments)    Due to chron's, difficult passing though kidneys not allowing absorption in the body    Prednisone Other (See Comments)    Altered mood   Sulfa Drugs Cross Reactors Other (See Comments)    "feels like bugs crawling on me"    Immunization History  Administered Date(s) Administered   Fluad Quad(high Dose 65+) 10/07/2019, 09/09/2020, 11/27/2021   Influenza, High Dose  Seasonal PF 08/28/2018   Influenza-Unspecified 11/23/2017   PFIZER(Purple Top)SARS-COV-2 Vaccination 02/16/2020, 03/17/2020, 12/14/2020, 05/14/2021, 10/27/2021   Pfizer Covid-19 Vaccine Bivalent Booster 37yr & up 10/27/2021   Pneumococcal Conjugate-13 02/13/2018   Pneumococcal Polysaccharide-23 03/12/2019    Family History  Problem Relation Age of Onset   Glaucoma Mother    High blood pressure Mother    High Cholesterol Mother    Sleep apnea Mother    Diabetes Mellitus II Father    Sleep apnea Father    Anxiety disorder Father    COPD Brother    Heart disease Brother    Non-Hodgkin's lymphoma Brother    Diabetes Mellitus II Brother    Glaucoma Brother    Colon cancer Neg Hx    Esophageal cancer Neg Hx    Pancreatic cancer Neg Hx    Stomach cancer Neg Hx    Liver disease Neg Hx      Current Outpatient Medications:    amoxicillin (AMOXIL) 500 MG capsule, Take 2,000 mg by mouth See admin instructions. Before dental procedures, Disp: , Rfl:    aspirin EC 81 MG tablet, Take 81 mg by mouth daily., Disp: , Rfl:    Calcium Carbonate-Vit D-Min (CALCIUM 1200) 1200-1000 MG-UNIT CHEW, Chew 1 tablet by mouth daily., Disp: , Rfl:  CANNABIDIOL PO, Take 1 Dose by mouth 2 (two) times daily as needed (anxiety/sleep.). CBD Oil, Disp: , Rfl:    cetirizine (ZYRTEC) 10 MG tablet, Take 10 mg by mouth daily as needed (sinus/allergies.)., Disp: , Rfl:    cetirizine-pseudoephedrine (ZYRTEC-D) 5-120 MG tablet, Take 1 tablet by mouth daily as needed (sinus headaches.)., Disp: , Rfl:    clonazePAM (KLONOPIN) 0.5 MG tablet, Take 0.5-1 tablets (0.25-0.5 mg total) by mouth daily as needed for anxiety., Disp: 90 tablet, Rfl: 1   cyanocobalamin (,VITAMIN B-12,) 1000 MCG/ML injection, INJECT 1 ML (1,000 MCG TOTAL) INTO THE MUSCLE EVERY 30 (THIRTY) DAYS. INJECT 1 ML (1,000 MCG) INTO THE MUSCLE EVERY 30 DAYS, Disp: 12 mL, Rfl: 0   FLUoxetine (PROZAC) 20 MG capsule, Take 1 capsule (20 mg total) by mouth daily.,  Disp: 90 capsule, Rfl: 3   fluticasone (FLONASE) 50 MCG/ACT nasal spray, Place 1-2 sprays into both nostrils daily as needed for allergies or rhinitis., Disp: , Rfl:    Glycerin-Polysorbate 80 (REFRESH DRY EYE THERAPY OP), Place 1 drop into both eyes daily., Disp: , Rfl:    hydrocortisone (PROCTOZONE-HC) 2.5 % rectal cream, Place 1 application rectally 2 (two) times daily as needed (Crohn's flare)., Disp: 28 g, Rfl: 2   ibuprofen (ADVIL) 200 MG tablet, Take 400-800 mg by mouth every 8 (eight) hours as needed (pain.)., Disp: , Rfl:    mesalamine (PENTASA) 250 MG CR capsule, Take 250-500 mg by mouth 4 (four) times daily as needed (crohn's disease flare ups)., Disp: , Rfl:    Multiple Vitamins-Minerals (ICAPS AREDS 2 PO), Take 1 tablet by mouth daily., Disp: , Rfl:    prednisoLONE acetate (PRED FORTE) 1 % ophthalmic suspension, Place 1 drop into both eyes 2 (two) times daily as needed (Uveitis)., Disp: , Rfl:    Probiotic Product (PROBIOTIC PO), Take 1 capsule by mouth 2 (two) times a week. As needed, Disp: , Rfl:    traMADol (ULTRAM) 50 MG tablet, Take 1 tablet (50 mg total) by mouth every 6 (six) hours as needed., Disp: 20 tablet, Rfl: 0   Treprostinil (TYVASO) 0.6 MG/ML SOLN, Inhale into the lungs 4 (four) times daily., Disp: , Rfl:    furosemide (LASIX) 20 MG tablet, Take 1 tablet (20 mg total) by mouth as needed for fluid., Disp: 90 tablet, Rfl: 3      Objective:   Vitals:   02/01/22 1333  BP: 128/64  Pulse: 82  Temp: 98.1 F (36.7 C)  TempSrc: Oral  SpO2: 97%  Weight: 266 lb (120.7 kg)  Height: 5' 5" (1.651 m)    Estimated body mass index is 44.26 kg/m as calculated from the following:   Height as of this encounter: 5' 5" (1.651 m).   Weight as of this encounter: 266 lb (120.7 kg).  _0 @  Filed Weights   02/01/22 1333  Weight: 266 lb (120.7 kg)     Physical Exam    General: No distress. Obese. On o2 Neuro: Alert and Oriented x 3. GCS 15. Speech  normal Psych: Pleasant Resp:  Barrel Chest - no.  Wheeze - no, Crackles - no, No overt respiratory distress CVS: Normal heart sounds. Murmurs - no Ext: Stigmata of Connective Tissue Disease - no HEENT: Normal upper airway. PEERL +. No post nasal drip        Assessment:       ICD-10-CM   1. Chronic hypoxemic respiratory failure (HCC)  J96.11 CT Chest High Resolution    Pulmonary function  test    2. ILD (interstitial lung disease) (HCC)  J84.9 CT Chest High Resolution    Pulmonary function test    3. WHO group 3 pulmonary arterial hypertension (HCC)  I27.23 CT Chest High Resolution    Pulmonary function test         Plan:     Patient Instructions  Chronic hypoxemic respiratory failure due to all of the below ILD (interstitial lung disease) ([place]) - progressive phenotype diagnosed  WHO group 3 pulmonary arterial hypertension ([place]) with elevaed PCWP to 20 on Dec 2021   L:ung function declined 17% in 3 years on FVC, fev1 and dlco. So finding it a bit odd that radiologist in 2023 feels you do not have ILD but have what is called ILA (interst lung abnormaliteis)   Glad rechallenge tyvaso is working well - currently at 4x/day - using 9 times each time and exercise tolerance better  Glad no more sewage exposure   Plan - continue tyvaso and slowly increase dose --Continue oxygen 3-6 L nasal cannula pulsed with exertion and at night 2 L nasal cannula and room air at rest -Continue exercise program  -However continue to keep her pulse ox at greater than or equal to 88% using a pulse oximeter --Make attempts to lose weight - Ge HRCT supine and prone 2 months - will discuss I our MDD case conference - do spirometry/dlco in 6 months  OSA on CPAP  Stable  Plan  -Continue CPAP at ngith with 2L [place] at night  per the advice of his sleep doctor - at some point can check o2 level at night   Associated emphysema on CT  Plan  - supporitive care   Follow-up  - 6 months s face  to face with Dr Chase Caller but afte PFT; 30 min visit   -  ( Level 05 visit: Estb 40-54 min  in  visit type: on-site physical face to visit  in total care time and counseling or/and coordination of care by this undersigned MD - Dr Brand Males. This includes one or more of the following on this same day 02/01/2022: pre-charting, chart review, note writing, documentation discussion of test results, diagnostic or treatment recommendations, prognosis, risks and benefits of management options, instructions, education, compliance or risk-factor reduction. It excludes time spent by the [place] or office staff in the care of the patient. Actual time 30 min)   SIGNATURE    Dr. Brand Males, M.D., F.C.C.P,  Pulmonary and Critical Care Medicine Staff Physician, [place] Director - Interstitial Lung Disease  Program  [facility] [place], [place], [address]  Pager: [phone], If no answer or between  15:00h - 7:00h: call [phone] Telephone: [phone]  2:15 PM 02/01/2022

## 2022-02-01 NOTE — Progress Notes (Signed)
Subjective  CC:  Chief Complaint  Patient presents with   Follow-up    6 month f/u    HPI: Patty [name] is a 71 y.o. female who presents to the office today to address the problems listed above in the chief complaint. Last here a year ago.  Here for follow-up of her chronic medical problems as listed below.  Unfortunately, she continues to struggle with her interstitial lung disease.  Reviewed pulmonology notes.  Now oxygen dependent when she is active.  He had recently a follow-up CT scan and has follow-up for results upcoming.  This has been debilitating for her.  Activity level is down due to shortness of breath.  Weight is secondarily up.  History of prediabetes but denies symptoms of hyperglycemia.  Crohn's disease is stable.  Mood is well controlled on current medications.  She takes Prozac 20 mg daily and clonazepam for sleep intermittently.  She says these are doing well for her.  She does request refills.  She has no new concerns. Health maintenance: Due for mammogram.  Eligible for Shingrix. Assessment  1. ILD (interstitial lung disease) ([place])   2. Pulmonary emphysema with fibrosis of lung ([place])   3. WHO group 3 pulmonary arterial hypertension (HCC) Chronic  4. Crohn's disease with complication, unspecified gastrointestinal tract location [facility]) Chronic  5. Morbid obesity (HCC) Chronic  6. Sleep disorder   7. Prediabetes   8. Encounter for screening mammogram for breast cancer   9. Adjustment disorder with anxiety      Plan  Depression: Well-controlled on Prozac and Klonopin as doses listed above.  Refilled medications as listed below. Interstitial lung disease per pulmonology. Health maintenance: Educated on screening recommendations.  Patient to schedule mammogram.  She will consider Shingrix.  Prescription given. Recheck A1c given history of prediabetes.  She will continue to eat healthy however activity level is limited.  She struggles with this.  Follow up: No  follow-ups on file.  Visit date not found  Orders Placed This Encounter  Procedures   MM DIGITAL SCREENING BILATERAL   POCT glycosylated hemoglobin (Hb A1C)   Meds ordered this encounter  Medications   FLUoxetine (PROZAC) 20 MG capsule    Sig: Take 1 capsule (20 mg total) by mouth daily.    Dispense:  90 capsule    Refill:  3   clonazePAM (KLONOPIN) 0.5 MG tablet    Sig: Take 0.5-1 tablets (0.25-0.5 mg total) by mouth daily as needed for anxiety.    Dispense:  90 tablet    Refill:  1    Please hold for next due refill.   Zoster Vaccine Adjuvanted [facility]) injection    Sig: Inject 0.5 mLs into the muscle once for 1 dose. Please give 2nd dose 2-6 months after first dose    Dispense:  2 each    Refill:  0      I reviewed the patients updated PMH, FH, and SocHx.    Patient Active Problem List   Diagnosis Date Noted   Pulmonary hypertension, unspecified ([place]) 12/07/2020    Priority: High   Sleep disorder 01/10/2020    Priority: High   Pernicious anemia 01/10/2020    Priority: High   Chronic prescription benzodiazepine use 01/10/2020    Priority: High   Morbid obesity ([place]) 09/24/2019    Priority: High   History of DVT (deep vein thrombosis), with PE 07/25/2019    Priority: High   OSA on CPAP     Priority: High  Crohn disease ([place]) 06/15/2017    Priority: High   ILD (interstitial lung disease) ([place]) 05/29/2017    Priority: High   Osteopenia 08/05/2020    Priority: Medium    Adjustment disorder with anxiety 04/29/2020    Priority: Medium    Dyslipidemia 09/24/2019    Priority: Medium    History of polycythemia vera 11/12/2014    Priority: Medium    Chronic allergic rhinitis 01/10/2020    Priority: Low   Pulmonary emphysema with fibrosis of lung ([place]) 01/31/2022   Sclerosing adenosis of right breast 02/04/2021   Prediabetes 02/01/2021   Benign neoplasm of rectum    Current Meds  Medication Sig   amoxicillin (AMOXIL) 500 MG capsule Take 2,000 mg by mouth See  admin instructions. Before dental procedures   aspirin EC 81 MG tablet Take 81 mg by mouth daily.   Calcium Carbonate-Vit D-Min (CALCIUM 1200) 1200-1000 MG-UNIT CHEW Chew 1 tablet by mouth daily.   CANNABIDIOL PO Take 1 Dose by mouth 2 (two) times daily as needed (anxiety/sleep.). CBD Oil   cetirizine (ZYRTEC) 10 MG tablet Take 10 mg by mouth daily as needed (sinus/allergies.).   cetirizine-pseudoephedrine (ZYRTEC-D) 5-120 MG tablet Take 1 tablet by mouth daily as needed (sinus headaches.).   cyanocobalamin (,VITAMIN B-12,) 1000 MCG/ML injection INJECT 1 ML (1,000 MCG TOTAL) INTO THE MUSCLE EVERY 30 (THIRTY) DAYS. INJECT 1 ML (1,000 MCG) INTO THE MUSCLE EVERY 30 DAYS   fluticasone (FLONASE) 50 MCG/ACT nasal spray Place 1-2 sprays into both nostrils daily as needed for allergies or rhinitis.   furosemide (LASIX) 20 MG tablet Take 1 tablet (20 mg total) by mouth as needed for fluid.   Glycerin-Polysorbate 80 (REFRESH DRY EYE THERAPY OP) Place 1 drop into both eyes daily.   hydrocortisone (PROCTOZONE-HC) 2.5 % rectal cream Place 1 application rectally 2 (two) times daily as needed (Crohn's flare).   ibuprofen (ADVIL) 200 MG tablet Take 400-800 mg by mouth every 8 (eight) hours as needed (pain.).   mesalamine (PENTASA) 250 MG CR capsule Take 250-500 mg by mouth 4 (four) times daily as needed (crohn's disease flare ups).   Multiple Vitamins-Minerals (ICAPS AREDS 2 PO) Take 1 tablet by mouth daily.   prednisoLONE acetate (PRED FORTE) 1 % ophthalmic suspension Place 1 drop into both eyes 2 (two) times daily as needed (Uveitis).   Probiotic Product (PROBIOTIC PO) Take 1 capsule by mouth 2 (two) times a week. As needed   traMADol (ULTRAM) 50 MG tablet Take 1 tablet (50 mg total) by mouth every 6 (six) hours as needed.   Treprostinil (TYVASO) 0.6 MG/ML SOLN Inhale into the lungs 4 (four) times daily.   [EXPIRED] Zoster Vaccine Adjuvanted [facility]) injection Inject 0.5 mLs into the muscle once for 1 dose.  Please give 2nd dose 2-6 months after first dose   [DISCONTINUED] clonazePAM (KLONOPIN) 0.5 MG tablet TAKE 1/2 TO 1 TABLETS (0.25-0.5 MG TOTAL) BY MOUTH DAILY AS NEEDED (FOR ANXIETY OR SLEEP.).   [DISCONTINUED] FLUoxetine (PROZAC) 20 MG capsule TAKE 1 CAPSULE (20 MG TOTAL) BY MOUTH DAILY. PLEASE SCHEDULE AN APPOINTMENT FOR FURTHER REFILLS   [DISCONTINUED] FLUoxetine (PROZAC) 20 MG capsule Take 1 capsule (20 mg total) by mouth daily. Please schedule an appointment for further refills    Allergies: Patient is allergic to victoza [liraglutide], lasix [furosemide], metformin and related, onion, pirfenidone, potassium-containing compounds, prednisone, and sulfa drugs cross reactors. Family History: Patient family history includes Anxiety disorder in her father; COPD in her brother; Diabetes Mellitus II in  her brother and father; Glaucoma in her brother and mother; Heart disease in her brother; High Cholesterol in her mother; High blood pressure in her mother; Non-Hodgkin's lymphoma in her brother; Sleep apnea in her father and mother. Social History:  Patient  reports that she quit smoking about 13 years ago. Her smoking use included cigarettes. She has a 45.00 pack-year smoking history. She has never used smokeless tobacco. She reports current alcohol use. She reports that she does not use drugs.  Review of Systems: Constitutional: Negative for fever malaise or anorexia Cardiovascular: negative for chest pain Respiratory: negative for SOB or persistent cough Gastrointestinal: negative for abdominal pain  Objective  Vitals: BP 128/79    Pulse 73    Temp 98.7 F (37.1 C) (Temporal)    Ht _0  (1.651 m)    Wt 266 lb 9.6 oz (120.9 kg)    SpO2 97%    BMI 44.36 kg/m  General: no acute distress , A&Ox3 HEENT: PEERL, conjunctiva normal, neck is supple Cardiovascular:  RRR without murmur or gallop.  Respiratory: Distant breath sounds bilaterally, CTAB with normal respiratory effort no rales Skin:   Warm, no rashes    Commons side effects, risks, benefits, and alternatives for medications and treatment plan prescribed today were discussed, and the patient expressed understanding of the given instructions. Patient is instructed to call or message via MyChart if he/she has any questions or concerns regarding our treatment plan. No barriers to understanding were identified. We discussed Red Flag symptoms and signs in detail. Patient expressed understanding regarding what to do in case of urgent or emergency type symptoms.  Medication list was reconciled, printed and provided to the patient in AVS. Patient instructions and summary information was reviewed with the patient as documented in the AVS. This note was prepared with assistance of Dragon voice recognition software. Occasional wrong-word or sound-a-like substitutions may have occurred due to the inherent limitations of voice recognition software  This visit occurred during the SARS-CoV-2 public health emergency.  Safety protocols were in place, including screening questions prior to the visit, additional usage of staff PPE, and extensive cleaning of exam room while observing appropriate contact time as indicated for disinfecting solutions.

## 2022-02-13 ENCOUNTER — Encounter: Payer: Self-pay | Admitting: Internal Medicine

## 2022-02-14 NOTE — Telephone Encounter (Signed)
Please advise.

## 2022-02-14 NOTE — Telephone Encounter (Signed)
Ok this would be a rare occurrence in the specific but in the aggregate meds are know to cause ILD. We can discuss at nex visit bcecuae f her at this point mgmt kind of same - because I do not see mesalamine anymore on her med list  She can bring article at next visi and we can talk  Also please print article and leave on my desk   Current Outpatient Medications:    amoxicillin (AMOXIL) 500 MG capsule, Take 2,000 mg by mouth See admin instructions. Before dental procedures, Disp: , Rfl:    aspirin EC 81 MG tablet, Take 81 mg by mouth daily., Disp: , Rfl:    Calcium Carbonate-Vit D-Min (CALCIUM 1200) 1200-1000 MG-UNIT CHEW, Chew 1 tablet by mouth daily., Disp: , Rfl:    CANNABIDIOL PO, Take 1 Dose by mouth 2 (two) times daily as needed (anxiety/sleep.). CBD Oil, Disp: , Rfl:    cetirizine (ZYRTEC) 10 MG tablet, Take 10 mg by mouth daily as needed (sinus/allergies.)., Disp: , Rfl:    cetirizine-pseudoephedrine (ZYRTEC-D) 5-120 MG tablet, Take 1 tablet by mouth daily as needed (sinus headaches.)., Disp: , Rfl:    clonazePAM (KLONOPIN) 0.5 MG tablet, Take 0.5-1 tablets (0.25-0.5 mg total) by mouth daily as needed for anxiety., Disp: 90 tablet, Rfl: 1   cyanocobalamin (,VITAMIN B-12,) 1000 MCG/ML injection, INJECT 1 ML (1,000 MCG TOTAL) INTO THE MUSCLE EVERY 30 (THIRTY) DAYS. INJECT 1 ML (1,000 MCG) INTO THE MUSCLE EVERY 30 DAYS, Disp: 12 mL, Rfl: 0   FLUoxetine (PROZAC) 20 MG capsule, Take 1 capsule (20 mg total) by mouth daily., Disp: 90 capsule, Rfl: 3   fluticasone (FLONASE) 50 MCG/ACT nasal spray, Place 1-2 sprays into both nostrils daily as needed for allergies or rhinitis., Disp: , Rfl:    furosemide (LASIX) 20 MG tablet, Take 1 tablet (20 mg total) by mouth as needed for fluid., Disp: 90 tablet, Rfl: 3   Glycerin-Polysorbate 80 (REFRESH DRY EYE THERAPY OP), Place 1 drop into both eyes daily., Disp: , Rfl:    hydrocortisone (PROCTOZONE-HC) 2.5 % rectal cream, Place 1 application rectally 2  (two) times daily as needed (Crohn's flare)., Disp: 28 g, Rfl: 2   ibuprofen (ADVIL) 200 MG tablet, Take 400-800 mg by mouth every 8 (eight) hours as needed (pain.)., Disp: , Rfl:    mesalamine (PENTASA) 250 MG CR capsule, Take 250-500 mg by mouth 4 (four) times daily as needed (crohn's disease flare ups)., Disp: , Rfl:    Multiple Vitamins-Minerals (ICAPS AREDS 2 PO), Take 1 tablet by mouth daily., Disp: , Rfl:    prednisoLONE acetate (PRED FORTE) 1 % ophthalmic suspension, Place 1 drop into both eyes 2 (two) times daily as needed (Uveitis)., Disp: , Rfl:    Probiotic Product (PROBIOTIC PO), Take 1 capsule by mouth 2 (two) times a week. As needed, Disp: , Rfl:    traMADol (ULTRAM) 50 MG tablet, Take 1 tablet (50 mg total) by mouth every 6 (six) hours as needed., Disp: 20 tablet, Rfl: 0   Treprostinil (TYVASO) 0.6 MG/ML SOLN, Inhale into the lungs 4 (four) times daily., Disp: , Rfl:

## 2022-02-14 NOTE — Telephone Encounter (Signed)
Will forward to [facility] to print article for MR.

## 2022-02-16 ENCOUNTER — Telehealth: Payer: Self-pay | Admitting: Family Medicine

## 2022-02-16 NOTE — Chronic Care Management (AMB) (Signed)
Chronic Care Management   Outreach Note  02/16/2022 Name: Patty [name] MRN: [id number] DOB: [birth year]  Referred by: [name], MD Reason for referral : No chief complaint on file.   An unsuccessful telephone outreach was attempted today. The patient was referred to the pharmacist for assistance with care management and care coordination.   Follow Up Plan:   Tatjana Dellinger Upstream Scheduler

## 2022-02-16 NOTE — Telephone Encounter (Signed)
Copied from [place] [id number]. Topic: Medicare AWV >> Feb 16, 2022 10:07 AM Harris-Coley, Hannah Beat wrote: Reason for CRM: Left message for patient to schedule Annual Wellness Visit.  Please schedule with Nurse Health Advisor Charlott Rakes, RN at [facility].  Please call [phone] ask for [facility]

## 2022-02-22 ENCOUNTER — Telehealth: Payer: Self-pay | Admitting: Family Medicine

## 2022-02-22 NOTE — Chronic Care Management (AMB) (Signed)
Chronic Care Management   Outreach Note  02/22/2022 Name: Patty [name] MRN: [id number] DOB: [birth year]  Referred by: [name], MD Reason for referral : No chief complaint on file.   A second unsuccessful telephone outreach was attempted today. The patient was referred to pharmacist for assistance with care management and care coordination.  Follow Up Plan:   Tatjana Dellinger Upstream Scheduler

## 2022-02-24 DIAGNOSIS — Z20822 Contact with and (suspected) exposure to covid-19: Secondary | ICD-10-CM | POA: Diagnosis not present

## 2022-02-28 ENCOUNTER — Telehealth: Payer: Self-pay | Admitting: Family Medicine

## 2022-02-28 NOTE — Chronic Care Management (AMB) (Signed)
?  Chronic Care Management  ? ?Outreach Note ? ?02/28/2022 ?Name: Machelle Raybon MRN: [id number] DOB: 03-Aug-1951 ? ?Referred by: [name], MD ?Reason for referral : No chief complaint on file. ? ? ?Third unsuccessful telephone outreach was attempted today. The patient was referred to the pharmacist for assistance with care management and care coordination.  ? ?Follow Up Plan:  ? ?Tatjana Dellinger ?Upstream Scheduler  ?

## 2022-03-24 DIAGNOSIS — Z20822 Contact with and (suspected) exposure to covid-19: Secondary | ICD-10-CM | POA: Diagnosis not present

## 2022-03-26 ENCOUNTER — Other Ambulatory Visit: Payer: Self-pay | Admitting: Cardiology

## 2022-03-29 ENCOUNTER — Encounter: Payer: Self-pay | Admitting: Internal Medicine

## 2022-03-29 ENCOUNTER — Other Ambulatory Visit: Payer: Medicare Other | Admitting: Internal Medicine

## 2022-03-29 VITALS — BP 138/86 | HR 73

## 2022-03-29 DIAGNOSIS — J439 Emphysema, unspecified: Secondary | ICD-10-CM

## 2022-03-29 DIAGNOSIS — J849 Interstitial pulmonary disease, unspecified: Secondary | ICD-10-CM

## 2022-03-29 DIAGNOSIS — Z515 Encounter for palliative care: Secondary | ICD-10-CM | POA: Diagnosis not present

## 2022-03-29 DIAGNOSIS — K50919 Crohn's disease, unspecified, with unspecified complications: Secondary | ICD-10-CM

## 2022-03-29 DIAGNOSIS — J8410 Pulmonary fibrosis, unspecified: Secondary | ICD-10-CM | POA: Diagnosis not present

## 2022-03-29 NOTE — Progress Notes (Signed)
[facility] Visit Telephone: [phone]  Fax: [phone]   Date of encounter: 03/29/22 3:14 PM PATIENT NAME: Patty [name] [address] [id number]   [phone] (home)  DOB: [birth year] MRN: [id number] PRIMARY CARE PROVIDER:    [name], MD,  [place] [address] [phone]  REFERRING PROVIDER:   Brand Males, MD, [facility]  RESPONSIBLE PARTY:    Contact Information     Name Relation Home Work Mobile   [place] Brother   [phone]        I met face to face with patient and family in her home. Palliative Care was asked to follow this patient by consultation request of  Dr. Chase Caller to address advance care planning and complex medical decision making. This is follow-up visit.                                     ASSESSMENT AND PLAN / RECOMMENDATIONS:   Advance Care Planning/Goals of Care: Goals include to maximize quality of life and symptom management. Patient/health care surrogate gave his/her permission to discuss.Our advance care planning conversation included a discussion about:    The value and importance of advance care planning  Experiences with loved ones who have been seriously ill or have died  Exploration of personal, cultural or spiritual beliefs that might influence medical decisions  Exploration of goals of care in the event of a sudden injury or illness  Identification  of a healthcare agent--her brother.  Has living will. Review and updating or creation of an  advance directive document . Decision not to resuscitate or to de-escalate disease focused treatments due to poor prognosis. CODE STATUS:  DNR.  MOST completed today:  DNR, limited additional interventions, abx case by case with probiotics, no feeding tube, IVF trial, d/w pt herself--recommended she discuss it with her brother.  She is going to scan and send to  him.  Symptom Management/Plan: 1. ILD (interstitial lung disease) ([place]) -seems it remains uncertain if she truly has this or COPD based on her imaging -continue O2 at 6L when needed--uses with exertion and recovery, but none typically at rest and sats at rest remain wnl -encouraged her to continue her pulmonary rehab exercise program with 3 times per week of 30 mins of walking in stores/other locations, 30 mins on octane stationary cycle or treadmill   2. Pulmonary emphysema with fibrosis of lung ([place]) -continue her current treprostinil and clonzapem for anxiety related to her dyspnea -f/u with Dr. Chase Caller as directed for repeat CT  3. Palliative care by specialist -completed MOST with patient today that will now be uploaded to vynca system  4. Crohn's disease with complication, unspecified gastrointestinal tract location [facility]) -no recent problems here, has been on mesalamine only as needed w/o recent use  Follow up Palliative Care Visit: Palliative care will continue to follow for complex medical decision making, advance care planning, and clarification of goals. Return 12 weeks or prn.Jul 03, 2022   This visit was coded based on medical decision making (MDM). 23 minutes spent on ACP doing MOST and reviewing goals of care, family support/involvement, etc.  PPS: 60%  HOSPICE ELIGIBILITY/DIAGNOSIS: TBD  Chief Complaint: Follow-up palliative visit  HISTORY OF PRESENT ILLNESS:  Patty [name] is a 71 y.o. year old female  with ILD vs copd with fibrosis, h/o polycythemia, b12 deficiency,  crohn's and obesity/OSA on cpap seen in palliative follow-up at home primarily to complete her MOST form today.  Since taking her tyvaso, her crohn's symptoms--cramping and things, have been better.  Doesn't think it's gone but better.   Also had polycythemia vera and b12 deficiency.  She has another CT chest tomorrow to reassess as imaging was read more as emphysema than ILD in Feb.      History  obtained from review of EMR, discussion with primary team, and interview with family, facility staff/caregiver and/or Ms. Pino.  I reviewed available labs, medications, imaging, studies and related documents from the EMR.  Records reviewed and summarized above.   ROS  General: NAD EYES: denies vision changes ENMT: denies dysphagia Cardiovascular: denies chest pain, has DOE--uses her 6L O2 then Pulmonary: denies cough, has increased SOB but not much different from 3 mos ago Abdomen: endorses good appetite, denies constipation, endorses continence of bowel GU: denies dysuria, endorses continence of urine MSK:  denies increased weakness,  no falls reported Skin: denies rashes or wounds Neurological: denies pain, denies insomnia Psych: Endorses positive mood Heme/lymph/immuno: denies bruises, abnormal bleeding  Physical Exam: Current and past weights:  266 lbs on 02/01/22 is most recent available, POX 98-100 on 6 L O2 today Constitutional: quite dyspneic when answered the door and had been over visiting with a neighbor General: obese  EYES: anicteric sclera, lids intact, no discharge  ENMT: intact hearing, oral mucous membranes moist, dentition intact CV: S1S2, RRR, no LE edema Pulmonary: LCTA, no increased work of breathing at rest, no cough, 6L O2 via [place] Abdomen: intake 100%, normo-active BS + 4 quadrants, soft and non tender, no ascites GU: deferred MSK: no sarcopenia, moves all extremities, ambulatory Skin: warm and dry, no rashes or wounds on visible skin Neuro:  no generalized weakness,  no cognitive impairment Psych: non-anxious affect, A and O x 3 Hem/lymph/immuno: no widespread bruising  CURRENT PROBLEM LIST:  Patient Active Problem List   Diagnosis Date Noted   Pulmonary emphysema with fibrosis of lung ([place]) 01/31/2022   Sclerosing adenosis of right breast 02/04/2021   Prediabetes 02/01/2021   Pulmonary hypertension, unspecified ([place]) 12/07/2020   Osteopenia  08/05/2020   Adjustment disorder with anxiety 04/29/2020   Sleep disorder 01/10/2020   Chronic allergic rhinitis 01/10/2020   Pernicious anemia 01/10/2020   Chronic prescription benzodiazepine use 01/10/2020   Benign neoplasm of rectum    Dyslipidemia 09/24/2019   Morbid obesity ([place]) 09/24/2019   History of DVT (deep vein thrombosis), with PE 07/25/2019   OSA on CPAP    Crohn disease ([place]) 06/15/2017   ILD (interstitial lung disease) ([place]) 05/29/2017   History of polycythemia vera 11/12/2014   PAST MEDICAL HISTORY:  Active Ambulatory Problems    Diagnosis Date Noted   ILD (interstitial lung disease) ([place]) 05/29/2017   History of polycythemia vera 11/12/2014   Crohn disease ([place]) 06/15/2017   OSA on CPAP    History of DVT (deep vein thrombosis), with PE 07/25/2019   Dyslipidemia 09/24/2019   Morbid obesity ([place]) 09/24/2019   Benign neoplasm of rectum    Sleep disorder 01/10/2020   Chronic allergic rhinitis 01/10/2020   Pernicious anemia 01/10/2020   Chronic prescription benzodiazepine use 01/10/2020   Adjustment disorder with anxiety 04/29/2020   Osteopenia 08/05/2020   Pulmonary hypertension, unspecified ([place]) 12/07/2020   Prediabetes 02/01/2021   Sclerosing adenosis of right breast 02/04/2021   Pulmonary emphysema with fibrosis of lung ([place]) 01/31/2022   Resolved Ambulatory Problems  Diagnosis Date Noted   Chronic respiratory failure with hypoxia ([place]) 05/29/2017   Abnormal stress test    Type 2 diabetes mellitus without complication, without long-term current use of insulin ([place]) 05/28/2019   Past Medical History:  Diagnosis Date   Abnormal nuclear stress test    Anxiety    Arthritis    Chronic diastolic CHF (congestive heart failure) (HCC)    DVT (deep venous thrombosis) (H[facility])    Dyspnea    Grade I diastolic dysfunction    Hyperlipidemia    Polycythemia vera (H[facility])    Pulmonary embolism ([place]) 2010   SOCIAL HX:  Social History   Tobacco Use   Smoking  status: Former    Packs/day: 1.00    Years: 45.00    Pack years: 45.00    Types: Cigarettes    Quit date: 12/26/2008    Years since quitting: 13.2   Smokeless tobacco: Never  Substance Use Topics   Alcohol use: Yes    Comment: occ     ALLERGIES:  Allergies  Allergen Reactions   Victoza [Liraglutide] Shortness Of Breath   Lasix [Furosemide] Itching    Hypersensitivity - pt can take as needed    Metformin And Related Diarrhea    Increased moodiness   Onion Other (See Comments)    sereve diarrhea, stomach pains (Crohn's flares)   Pirfenidone Other (See Comments)    GI side effects   Potassium-Containing Compounds Other (See Comments)    Due to chron's, difficult passing though kidneys not allowing absorption in the body    Prednisone Other (See Comments)    Altered mood   Sulfa Drugs Cross Reactors Other (See Comments)    "feels like bugs crawling on me"     PERTINENT MEDICATIONS:  Outpatient Encounter Medications as of 03/29/2022  Medication Sig   amoxicillin (AMOXIL) 500 MG capsule Take 2,000 mg by mouth See admin instructions. Before dental procedures   aspirin EC 81 MG tablet Take 81 mg by mouth daily.   Calcium Carbonate-Vit D-Min (CALCIUM 1200) 1200-1000 MG-UNIT CHEW Chew 1 tablet by mouth daily.   CANNABIDIOL PO Take 1 Dose by mouth 2 (two) times daily as needed (anxiety/sleep.). CBD Oil   cetirizine (ZYRTEC) 10 MG tablet Take 10 mg by mouth daily as needed (sinus/allergies.).   cetirizine-pseudoephedrine (ZYRTEC-D) 5-120 MG tablet Take 1 tablet by mouth daily as needed (sinus headaches.).   clonazePAM (KLONOPIN) 0.5 MG tablet Take 0.5-1 tablets (0.25-0.5 mg total) by mouth daily as needed for anxiety.   cyanocobalamin (,VITAMIN B-12,) 1000 MCG/ML injection INJECT 1 ML (1,000 MCG TOTAL) INTO THE MUSCLE EVERY 30 (THIRTY) DAYS. INJECT 1 ML (1,000 MCG) INTO THE MUSCLE EVERY 30 DAYS   FLUoxetine (PROZAC) 20 MG capsule Take 1 capsule (20 mg total) by mouth daily.   fluticasone  (FLONASE) 50 MCG/ACT nasal spray Place 1-2 sprays into both nostrils daily as needed for allergies or rhinitis.   furosemide (LASIX) 20 MG tablet TAKE 1 TABLET (20 MG TOTAL) BY MOUTH AS NEEDED FOR FLUID.   Glycerin-Polysorbate 80 (REFRESH DRY EYE THERAPY OP) Place 1 drop into both eyes daily.   hydrocortisone (PROCTOZONE-HC) 2.5 % rectal cream Place 1 application rectally 2 (two) times daily as needed (Crohn's flare).   ibuprofen (ADVIL) 200 MG tablet Take 400-800 mg by mouth every 8 (eight) hours as needed (pain.).   mesalamine (PENTASA) 250 MG CR capsule Take 250-500 mg by mouth 4 (four) times daily as needed (crohn's disease flare ups).   Multiple  Vitamins-Minerals (ICAPS AREDS 2 PO) Take 1 tablet by mouth daily.   prednisoLONE acetate (PRED FORTE) 1 % ophthalmic suspension Place 1 drop into both eyes 2 (two) times daily as needed (Uveitis).   Probiotic Product (PROBIOTIC PO) Take 1 capsule by mouth 2 (two) times a week. As needed   traMADol (ULTRAM) 50 MG tablet Take 1 tablet (50 mg total) by mouth every 6 (six) hours as needed.   Treprostinil (TYVASO) 0.6 MG/ML SOLN Inhale into the lungs 4 (four) times daily.   No facility-administered encounter medications on file as of 03/29/2022.   Thank you for the opportunity to participate in the care of Ms. Greiner.  The palliative care team will continue to follow. Please call our office at [phone] if we can be of additional assistance.   Hollace Kinnier, DO  COVID-19 PATIENT SCREENING TOOL Asked and negative response unless otherwise noted:  Have you had symptoms of covid, tested positive or been in contact with someone with symptoms/positive test in the past 5-10 days? no

## 2022-03-30 ENCOUNTER — Ambulatory Visit
Admission: RE | Admit: 2022-03-30 | Discharge: 2022-03-30 | Disposition: A | Discharge status: Home / Self Care | Payer: Medicare Other | Source: Ambulatory Visit | Attending: Internal Medicine | Admitting: Internal Medicine

## 2022-03-30 DIAGNOSIS — D171 Benign lipomatous neoplasm of skin and subcutaneous tissue of trunk: Secondary | ICD-10-CM | POA: Diagnosis not present

## 2022-03-30 DIAGNOSIS — I2510 Atherosclerotic heart disease of native coronary artery without angina pectoris: Secondary | ICD-10-CM | POA: Diagnosis not present

## 2022-03-30 DIAGNOSIS — J9611 Chronic respiratory failure with hypoxia: Secondary | ICD-10-CM

## 2022-03-30 DIAGNOSIS — J849 Interstitial pulmonary disease, unspecified: Secondary | ICD-10-CM | POA: Diagnosis not present

## 2022-03-30 DIAGNOSIS — I2723 Pulmonary hypertension due to lung diseases and hypoxia: Secondary | ICD-10-CM

## 2022-03-30 DIAGNOSIS — I7789 Other specified disorders of arteries and arterioles: Secondary | ICD-10-CM | POA: Diagnosis not present

## 2022-04-11 DIAGNOSIS — Z20822 Contact with and (suspected) exposure to covid-19: Secondary | ICD-10-CM | POA: Diagnosis not present

## 2022-04-14 ENCOUNTER — Encounter: Payer: Self-pay | Admitting: Internal Medicine

## 2022-04-14 DIAGNOSIS — Z20822 Contact with and (suspected) exposure to covid-19: Secondary | ICD-10-CM | POA: Diagnosis not present

## 2022-04-18 NOTE — Telephone Encounter (Signed)
Mychart message sent by pt: ?[facility] (supporting Brand Males, MD) 4 days ago  ? ?Hi-I have been using the E Oxygen Tanks at home as I found just walking around the house my oxygen level will drop below 88. I have had 2 Deliveries since Jan. and currently have 6 empty tanks. [facility] ([facility]) now refuses to deliver because I have a refillable concentrator yet they do not have any E-tank Refillable. They want me to load all the tanks-take them to the retail office to get replacements. I have contacted the Retail manage-and the Regional Manager via email but no response. ?I advised them that it is difficult for me to carry these tanks to/from the car due to oxygen levels dropping even using oxygen when I carry heavier objects.  ? I read Medicare standards for DME - It states Your doctor may decide that your oxygen equipment is no longer effective for you. If so, he or she may notify the oxygen supplier with a new letter of medical necessity for different equipment. The oxygen supplier must provide you with equipment that fits your needs. It should address your mobility needs both inside and outside your home. ?Could you submit an updated letter of medical necessity to [place] advising that I now need 5 E Tanks DELIVERED monthly in addition to the concentrator refill tanks which I use for Nighttime oxygen and to refill smaller tanks (for exercise and other activities that I need both hands)? ?Hopefully, this will help me with delivery issues.  ?I appreciate it. Trying to find any way to remain as active as possible. ?Ayjah Show ? ? ? ?MR, please advise. ?

## 2022-04-19 DIAGNOSIS — H04123 Dry eye syndrome of bilateral lacrimal glands: Secondary | ICD-10-CM | POA: Diagnosis not present

## 2022-04-19 DIAGNOSIS — H40013 Open angle with borderline findings, low risk, bilateral: Secondary | ICD-10-CM | POA: Diagnosis not present

## 2022-04-19 DIAGNOSIS — R7309 Other abnormal glucose: Secondary | ICD-10-CM | POA: Diagnosis not present

## 2022-04-19 DIAGNOSIS — H353132 Nonexudative age-related macular degeneration, bilateral, intermediate dry stage: Secondary | ICD-10-CM | POA: Diagnosis not present

## 2022-04-19 LAB — HM DIABETES EYE EXAM

## 2022-04-19 NOTE — Telephone Encounter (Signed)
Please do a letter for the patient as requested ?

## 2022-04-22 NOTE — Telephone Encounter (Signed)
Patty [name], pt is requesting new letter of medical necessity. Do you have one that MR needs to sign on her? ?

## 2022-04-22 NOTE — Telephone Encounter (Signed)
I have nothing on this patient for a CMN looks like she needs a letter ?

## 2022-04-25 DIAGNOSIS — Z20822 Contact with and (suspected) exposure to covid-19: Secondary | ICD-10-CM | POA: Diagnosis not present

## 2022-04-26 NOTE — Progress Notes (Signed)
Called and spoke with [name] from [employer] about the message sent by pt. [name] asked me to send the message to her so she could send it to someone higher up for them to review this. Community message sent to [employer]. Also have updated pt. ?

## 2022-04-27 DIAGNOSIS — Z20822 Contact with and (suspected) exposure to covid-19: Secondary | ICD-10-CM | POA: Diagnosis not present

## 2022-04-29 DIAGNOSIS — Z20822 Contact with and (suspected) exposure to covid-19: Secondary | ICD-10-CM | POA: Diagnosis not present

## 2022-05-01 DIAGNOSIS — Z20822 Contact with and (suspected) exposure to covid-19: Secondary | ICD-10-CM | POA: Diagnosis not present

## 2022-05-03 ENCOUNTER — Encounter (HOSPITAL_COMMUNITY): Payer: Self-pay

## 2022-06-01 ENCOUNTER — Encounter: Payer: Self-pay | Admitting: Internal Medicine

## 2022-06-06 ENCOUNTER — Encounter: Payer: Self-pay | Admitting: Internal Medicine

## 2022-06-07 NOTE — Telephone Encounter (Signed)
MR, please see mychart message sent by pt as well as the letter she has attached.

## 2022-06-07 NOTE — Telephone Encounter (Signed)
Ok do I need to do anything about it ? Or just FYI?

## 2022-06-08 NOTE — Telephone Encounter (Signed)
MR- please advise on pt's response, thanks!  I did need a written letter with his opinion if as stated in my letter in order to proceed with the lawyers. If he needs to wait and discuss with me further then I will wait until the August appointment. Just let me know. Thank you.

## 2022-06-14 NOTE — Telephone Encounter (Signed)
Probably we can discuss at August visti. Pls ensure 30 min slot

## 2022-06-29 ENCOUNTER — Other Ambulatory Visit: Payer: Self-pay | Admitting: Cardiovascular Disease

## 2022-06-30 ENCOUNTER — Encounter: Payer: Self-pay | Admitting: Internal Medicine

## 2022-06-30 ENCOUNTER — Other Ambulatory Visit: Payer: Medicare Other | Admitting: Internal Medicine

## 2022-06-30 VITALS — HR 92

## 2022-06-30 DIAGNOSIS — Z515 Encounter for palliative care: Secondary | ICD-10-CM | POA: Diagnosis not present

## 2022-06-30 DIAGNOSIS — K50919 Crohn's disease, unspecified, with unspecified complications: Secondary | ICD-10-CM | POA: Diagnosis not present

## 2022-06-30 DIAGNOSIS — J849 Interstitial pulmonary disease, unspecified: Secondary | ICD-10-CM | POA: Diagnosis not present

## 2022-06-30 DIAGNOSIS — J9611 Chronic respiratory failure with hypoxia: Secondary | ICD-10-CM

## 2022-07-01 ENCOUNTER — Telehealth: Payer: Self-pay | Admitting: Internal Medicine

## 2022-07-01 DIAGNOSIS — J849 Interstitial pulmonary disease, unspecified: Secondary | ICD-10-CM

## 2022-07-01 DIAGNOSIS — I2723 Pulmonary hypertension due to lung diseases and hypoxia: Secondary | ICD-10-CM

## 2022-07-01 MED ORDER — TYVASO REFILL KIT 0.6 MG/ML IN SOLN: 54.0000 ug | Freq: Four times a day (QID) | RESPIRATORY_TRACT | 11 refills | Status: DC

## 2022-07-01 NOTE — Telephone Encounter (Signed)
I've reached out to patient via MyChart and VM to determine what dose of Tyvaso (how many breaths she is using per treatment session). Called [facility] to determine since they confirm with each refill. Per conversation with patient on 06/23/22, she is using 9 breaths  per treatment session. Refill sent to reflect this for 28 day supply with 11 r/f  Knox Saliva, PharmD, MPH, BCPS, CPP Clinical Pharmacist (Rheumatology and Pulmonology)

## 2022-07-01 NOTE — Telephone Encounter (Signed)
Please advise. Thanks.  

## 2022-07-04 ENCOUNTER — Encounter: Payer: Self-pay | Admitting: Internal Medicine

## 2022-07-04 NOTE — Progress Notes (Signed)
[place] Visit Telephone: [phone]  Fax: [phone]   Date of encounter: 07/04/22 10:22 AM PATIENT NAME: Patty [name] [address] [address]   [id number] (home)  DOB: Jun 09, 1951 MRN: [id number] PRIMARY CARE PROVIDER:    [name], MD,  [place] [address] [phone]  REFERRING PROVIDER:   [name], [address] [place],  [place] [address] [phone]  RESPONSIBLE PARTY:    Contact Information     Name Relation Home Work Mobile   [place] Brother   [phone]        I met face to face with patient and family in her home. Palliative Care was asked to follow this patient by consultation request of  [name], MD to address advance care planning and complex medical decision making. This is follow-up visit.                                     ASSESSMENT AND PLAN / RECOMMENDATIONS:   Advance Care Planning/Goals of Care: Goals include to maximize quality of life and symptom management. Patient/health care surrogate gave his/her permission to discuss.Our advance care planning conversation included a discussion about:    The value and importance of advance care planning  Experiences with loved ones who have been seriously ill or have died  Exploration of personal, cultural or spiritual beliefs that might influence medical decisions  Exploration of goals of care in the event of a sudden injury or illness  Identification  of a healthcare agent--brother in [facility] Review and updating or creation of an  advance directive document . Decision not to resuscitate or to de-escalate disease focused treatments due to poor prognosis. CODE STATUS:  DNR, MOST on file  Symptom Management/Plan: 1. Chronic respiratory failure with hypoxia (HCC) -continue O2 at 4L via [place]  2. ILD (interstitial lung disease) ([place]) -pulmonary fibrosis felt to be  cause -continue treatment per pulmonary recommendations -she does not seem much different from last time so will plan to f/u in October this time  3. Crohn's disease with complication, unspecified gastrointestinal tract location [facility]) -no recent flares   4. Palliative care by specialist -monitor over time, but patient feels like she has stabilized/is in remission, continue close monitoring with pulmonary  Follow up Palliative Care Visit: Palliative care will continue to follow for complex medical decision making, advance care planning, and clarification of goals. Return 11/03/2022 and prn.  I spent 60 minutes providing this consultation. More than 50% of the time in this consultation was spent in counseling and care coordination.   PPS: 60%  HOSPICE ELIGIBILITY/DIAGNOSIS: TBD  Chief Complaint: Follow-up palliative visit  HISTORY OF PRESENT ILLNESS:  Patty [name] is a 71 y.o. year old female  with pulmonary fibrosis, possibly some copd, crohn's disease, obesity, anxiety, OSA on CPAP seen for palliative f/u.  She remains on 4L O2 continuously.  She notes that her HR will increase as high as 140s with exertion when packing items, moving things.  Sats drop to 88 but then she stops and rests and it goes up.  Reports she was told that her vital capacity is down by 17%.  CT last eval did not show pulmonary fibrosis, but she had increased scarring.  She continues on tivaso at 9 doses per day and her Crohn's has remained better though  she has only 9 in of large intestine left.  She is using a full klonopin and less melatonin at hs.  Continues on cpap for OSA, but is in need of possible retitration she reports.     History obtained from review of EMR, discussion with primary team, and interview with family, facility staff/caregiver and/or Ms. Mallin.   I reviewed available labs, medications, imaging, studies and related documents from the EMR.  Records reviewed and summarized above.   ROS  see hpi Review of Systems  Physical Exam: Vitals:   06/30/22 1021  Pulse: 92  SpO2: 98%   There is no height or weight on file to calculate BMI. Wt Readings from Last 500 Encounters:  02/01/22 266 lb (120.7 kg)  01/31/22 266 lb 9.6 oz (120.9 kg)  12/07/21 272 lb 9.6 oz (123.7 kg)  11/30/21 275 lb (124.7 kg)  09/07/21 266 lb 6.4 oz (120.8 kg)  05/31/21 259 lb 12.8 oz (117.8 kg)  04/13/21 256 lb 6.3 oz (116.3 kg)  03/30/21 255 lb 11.7 oz (116 kg)  03/16/21 257 lb 0.9 oz (116.6 kg)  03/02/21 256 lb 13.4 oz (116.5 kg)  02/23/21 255 lb 6.4 oz (115.8 kg)  02/15/21 257 lb 8 oz (116.8 kg)  02/04/21 255 lb (115.7 kg)  02/01/21 255 lb (115.7 kg)  01/28/21 255 lb 11.2 oz (116 kg)  01/13/21 257 lb (116.6 kg)  12/11/20 258 lb 9.6 oz (117.3 kg)  12/07/20 255 lb (115.7 kg)  11/25/20 261 lb (118.4 kg)  10/21/20 253 lb (114.8 kg)  10/08/20 253 lb 12.8 oz (115.1 kg)  09/09/20 256 lb 6.4 oz (116.3 kg)  06/23/20 250 lb 9.6 oz (113.7 kg)  04/29/20 254 lb 9.6 oz (115.5 kg)  03/16/20 257 lb 9.6 oz (116.8 kg)  02/12/20 247 lb (112 kg)  01/16/20 248 lb (112.5 kg)  01/10/20 245 lb (111.1 kg)  11/28/19 245 lb (111.1 kg)  10/24/19 246 lb (111.6 kg)  10/07/19 251 lb (113.9 kg)  09/26/19 250 lb (113.4 kg)  09/24/19 251 lb (113.9 kg)  06/25/19 227 lb (103 kg)  06/24/19 227 lb (103 kg)  03/12/19 232 lb (105.2 kg)  03/11/19 227 lb (103 kg)  02/18/19 224 lb (101.6 kg)  01/21/19 223 lb (101.2 kg)  01/07/19 227 lb (103 kg)  12/06/18 221 lb (100.2 kg)  11/08/18 222 lb (100.7 kg)  10/04/18 219 lb (99.3 kg)  08/28/18 223 lb (101.2 kg)  08/07/18 222 lb (100.7 kg)  06/14/18 231 lb (104.8 kg)  05/29/18 235 lb (106.6 kg)  05/08/18 241 lb (109.3 kg)  04/24/18 244 lb (110.7 kg)  04/05/18 251 lb (113.9 kg)  02/13/18 253 lb (114.8 kg)  02/01/18 261 lb 11 oz (118.7 kg)  01/30/18 258 lb 2.5 oz (117.1 kg)  01/09/18 261 lb 7.5 oz (118.6 kg)  01/04/18 254 lb 10.1 oz (115.5 kg)  01/02/18 254 lb 13.6 oz  (115.6 kg)  12/28/17 258 lb 9.6 oz (117.3 kg)  12/21/17 254 lb 3.1 oz (115.3 kg)  12/14/17 255 lb 1.2 oz (115.7 kg)  12/05/17 256 lb 2.8 oz (116.2 kg)  11/30/17 253 lb 1.4 oz (114.8 kg)  11/28/17 255 lb 8.2 oz (115.9 kg)  11/23/17 254 lb 12.8 oz (115.6 kg)  11/22/17 257 lb 6.4 oz (116.8 kg)  11/21/17 256 lb 6.3 oz (116.3 kg)  11/14/17 253 lb 15.5 oz (115.2 kg)  11/09/17 253 lb 8.5 oz (115 kg)  10/17/17 250 lb 9.6 oz (113.7 kg)  10/11/17 240 lb (108.9 kg)  08/15/17 249 lb 3.2 oz (113 kg)  07/29/17 256 lb 6.3 oz (116.3 kg)  07/13/17 239 lb (108.4 kg)  06/15/17 239 lb 12.8 oz (108.8 kg)  05/29/17 240 lb 3.2 oz (109 kg)  05/09/17 270 lb 9.6 oz (122.7 kg)  02/22/12 225 lb (102.1 kg)   Physical Exam Constitutional:      Appearance: Normal appearance. She is obese.  HENT:     Head: Normocephalic and atraumatic.  Cardiovascular:     Rate and Rhythm: Regular rhythm. Tachycardia present.     Pulses: Normal pulses.     Heart sounds: Normal heart sounds.  Pulmonary:     Effort: Pulmonary effort is normal.     Breath sounds: Normal breath sounds.     Comments: No velcro or wheezing Abdominal:     General: Bowel sounds are normal.  Musculoskeletal:        General: Normal range of motion.     Right lower leg: No edema.     Left lower leg: No edema.  Skin:    General: Skin is warm and dry.  Neurological:     General: No focal deficit present.     Mental Status: She is alert and oriented to person, place, and time.  Psychiatric:        Mood and Affect: Mood normal.     CURRENT PROBLEM LIST:  Patient Active Problem List   Diagnosis Date Noted   Pulmonary emphysema with fibrosis of lung ([place]) 01/31/2022   Sclerosing adenosis of right breast 02/04/2021   Prediabetes 02/01/2021   Pulmonary hypertension, unspecified ([place]) 12/07/2020   Osteopenia 08/05/2020   Adjustment disorder with anxiety 04/29/2020   Sleep disorder 01/10/2020   Chronic allergic rhinitis 01/10/2020    Pernicious anemia 01/10/2020   Chronic prescription benzodiazepine use 01/10/2020   Benign neoplasm of rectum    Dyslipidemia 09/24/2019   Morbid obesity ([place]) 09/24/2019   History of DVT (deep vein thrombosis), with PE 07/25/2019   OSA on CPAP    Crohn disease ([place]) 06/15/2017   ILD (interstitial lung disease) ([place]) 05/29/2017   History of polycythemia vera 11/12/2014    PAST MEDICAL HISTORY:  Active Ambulatory Problems    Diagnosis Date Noted   ILD (interstitial lung disease) ([place]) 05/29/2017   History of polycythemia vera 11/12/2014   Crohn disease ([place]) 06/15/2017   OSA on CPAP    History of DVT (deep vein thrombosis), with PE 07/25/2019   Dyslipidemia 09/24/2019   Morbid obesity ([place]) 09/24/2019   Benign neoplasm of rectum    Sleep disorder 01/10/2020   Chronic allergic rhinitis 01/10/2020   Pernicious anemia 01/10/2020   Chronic prescription benzodiazepine use 01/10/2020   Adjustment disorder with anxiety 04/29/2020   Osteopenia 08/05/2020   Pulmonary hypertension, unspecified ([place]) 12/07/2020   Prediabetes 02/01/2021   Sclerosing adenosis of right breast 02/04/2021   Pulmonary emphysema with fibrosis of lung ([place]) 01/31/2022   Resolved Ambulatory Problems    Diagnosis Date Noted   Chronic respiratory failure with hypoxia ([place]) 05/29/2017   Abnormal stress test    Type 2 diabetes mellitus without complication, without long-term current use of insulin ([place]) 05/28/2019   Past Medical History:  Diagnosis Date   Abnormal nuclear stress test    Anxiety    Arthritis    Chronic diastolic CHF (congestive heart failure) (HCC)    DVT (deep venous thrombosis) (HCC)    Dyspnea    Grade I diastolic dysfunction  Hyperlipidemia    Polycythemia vera ([place])    Pulmonary embolism ([place]) 2010    SOCIAL HX:  Social History   Tobacco Use   Smoking status: Former    Packs/day: 1.00    Years: 45.00    Total pack years: 45.00    Types: Cigarettes    Quit date: 12/26/2008     Years since quitting: 13.5   Smokeless tobacco: Never  Substance Use Topics   Alcohol use: Yes    Comment: occ     ALLERGIES:  Allergies  Allergen Reactions   Victoza [Liraglutide] Shortness Of Breath   Metformin And Related Diarrhea    Increased moodiness   Onion Other (See Comments)    sereve diarrhea, stomach pains (Crohn's flares)   Lasix [Furosemide] Itching    Hypersensitivity - pt can take as needed    Pirfenidone Other (See Comments)    GI side effects   Potassium-Containing Compounds Other (See Comments)    Due to chron's, difficult passing though kidneys not allowing absorption in the body    Prednisone Other (See Comments)    Altered mood   Sulfa Drugs Cross Reactors Other (See Comments)    "feels like bugs crawling on me"      PERTINENT MEDICATIONS:  Outpatient Encounter Medications as of 06/30/2022  Medication Sig   amoxicillin (AMOXIL) 500 MG capsule Take 2,000 mg by mouth See admin instructions. Before dental procedures   aspirin EC 81 MG tablet Take 81 mg by mouth daily.   Calcium Carbonate-Vit D-Min (CALCIUM 1200) 1200-1000 MG-UNIT CHEW Chew 1 tablet by mouth daily.   CANNABIDIOL PO Take 1 Dose by mouth 2 (two) times daily as needed (anxiety/sleep.). CBD Oil   cetirizine (ZYRTEC) 10 MG tablet Take 10 mg by mouth daily as needed (sinus/allergies.).   cetirizine-pseudoephedrine (ZYRTEC-D) 5-120 MG tablet Take 1 tablet by mouth daily as needed (sinus headaches.).   clonazePAM (KLONOPIN) 0.5 MG tablet Take 0.5-1 tablets (0.25-0.5 mg total) by mouth daily as needed for anxiety.   cyanocobalamin (,VITAMIN B-12,) 1000 MCG/ML injection INJECT 1 ML (1,000 MCG TOTAL) INTO THE MUSCLE EVERY 30 (THIRTY) DAYS. INJECT 1 ML (1,000 MCG) INTO THE MUSCLE EVERY 30 DAYS   FLUoxetine (PROZAC) 20 MG capsule Take 1 capsule (20 mg total) by mouth daily.   fluticasone (FLONASE) 50 MCG/ACT nasal spray Place 1-2 sprays into both nostrils daily as needed for allergies or rhinitis.    furosemide (LASIX) 20 MG tablet Take 1 tablet (20 mg total) by mouth as needed for fluid.   Glycerin-Polysorbate 80 (REFRESH DRY EYE THERAPY OP) Place 1 drop into both eyes daily.   hydrocortisone (PROCTOZONE-HC) 2.5 % rectal cream Place 1 application rectally 2 (two) times daily as needed (Crohn's flare).   ibuprofen (ADVIL) 200 MG tablet Take 400-800 mg by mouth every 8 (eight) hours as needed (pain.).   mesalamine (PENTASA) 250 MG CR capsule Take 250-500 mg by mouth 4 (four) times daily as needed (crohn's disease flare ups).   Multiple Vitamins-Minerals (ICAPS AREDS 2 PO) Take 1 tablet by mouth daily.   prednisoLONE acetate (PRED FORTE) 1 % ophthalmic suspension Place 1 drop into both eyes 2 (two) times daily as needed (Uveitis).   Probiotic Product (PROBIOTIC PO) Take 1 capsule by mouth 2 (two) times a week. As needed   traMADol (ULTRAM) 50 MG tablet Take 1 tablet (50 mg total) by mouth every 6 (six) hours as needed.   [DISCONTINUED] furosemide (LASIX) 20 MG tablet TAKE 1 TABLET (20  MG TOTAL) BY MOUTH AS NEEDED FOR FLUID.   [DISCONTINUED] Treprostinil (TYVASO) 0.6 MG/ML SOLN Inhale into the lungs 4 (four) times daily.   No facility-administered encounter medications on file as of 06/30/2022.    Thank you for the opportunity to participate in the care of Ms. Erby.  The palliative care team will continue to follow. Please call our office at [phone] if we can be of additional assistance.   Hollace Kinnier, DO  COVID-19 PATIENT SCREENING TOOL Asked and negative response unless otherwise noted:  Have you had symptoms of covid, tested positive or been in contact with someone with symptoms/positive test in the past 5-10 days? no

## 2022-07-22 ENCOUNTER — Telehealth: Payer: Self-pay | Admitting: Family Medicine

## 2022-07-22 ENCOUNTER — Encounter (INDEPENDENT_AMBULATORY_CARE_PROVIDER_SITE_OTHER): Payer: Medicare Other | Admitting: Family Medicine

## 2022-07-22 DIAGNOSIS — F5101 Primary insomnia: Secondary | ICD-10-CM

## 2022-07-22 NOTE — Telephone Encounter (Signed)
Copied from [place] [id number]. Topic: Medicare AWV >> Jul 22, 2022  2:03 PM Devoria Glassing wrote: Reason for CRM: Left message for patient to schedule Annual Wellness Visit.  Please schedule with Nurse Health Advisor Charlott Rakes, RN at [facility]. This appt can be telephone or office visit. Please call [phone] ask for [facility]

## 2022-07-26 MED ORDER — TRAZODONE HCL 50 MG PO TABS: 25.0000 mg | ORAL_TABLET | Freq: Every evening | ORAL | 3 refills | Status: DC | PRN

## 2022-07-26 NOTE — Telephone Encounter (Signed)
Please see the MyChart message reply(ies) for my assessment and plan.  The patient gave consent for this Medical Advice Message and is aware that it may result in a bill to their insurance company as well as the possibility that this may result in a co-payment or deductible. They are an established patient, but are not seeking medical advice exclusively about a problem treated during an in person or video visit in the last 7 days. I did not recommend an in person or video visit within 7 days of my reply.  I spent a total of 8 minutes cumulative time within 7 days through [employer] [name], MD

## 2022-07-27 ENCOUNTER — Ambulatory Visit (INDEPENDENT_AMBULATORY_CARE_PROVIDER_SITE_OTHER): Payer: Medicare Other | Admitting: Podiatry

## 2022-07-27 ENCOUNTER — Ambulatory Visit: Payer: Medicare Other | Admitting: Nurse Practitioner

## 2022-07-27 DIAGNOSIS — Q828 Other specified congenital malformations of skin: Secondary | ICD-10-CM | POA: Diagnosis not present

## 2022-07-27 NOTE — Progress Notes (Signed)
Subjective:  Patient ID: Patty [name], female    DOB: Jun 25, 1951,  MRN: [id number]  Chief Complaint  Patient presents with   Nail Problem    71 y.o. female presents with the above complaint.  Patient presents with left submetatarsal 5 porokeratotic lesion with underlying plantarflexed metatarsal.  Patient states pain for touch is progressive gotten worse.  She will get it evaluated she has not seen and was prior to seeing me.  She is not a diabetic.  Pain scale 7 out of 10 hurts with ambulation.   Review of Systems: Negative except as noted in the HPI. Denies N/V/F/Ch.  Past Medical History:  Diagnosis Date   Abnormal nuclear stress test    a. 07/2017: cath with no CAD   Anxiety    Arthritis    Chronic diastolic CHF (congestive heart failure) (HCC)    Crohn disease (HCC)    DVT (deep venous thrombosis) (HCC)    Dyspnea    Grade I diastolic dysfunction    Hyperlipidemia    ILD (interstitial lung disease) (HCC)    OSA on CPAP    CPAP    AND O2   Pernicious anemia    Polycythemia vera ([place])    Pulmonary embolism ([place]) 2010   s/p hip surgery , reports this was never truly confirmed    Sleep disorder 01/10/2020   Uses klonopin for sleep since 2014; sleep walking. And anxiety    Current Outpatient Medications:    amoxicillin (AMOXIL) 500 MG capsule, Take 2,000 mg by mouth See admin instructions. Before dental procedures, Disp: , Rfl:    aspirin EC 81 MG tablet, Take 81 mg by mouth daily., Disp: , Rfl:    Calcium Carbonate-Vit D-Min (CALCIUM 1200) 1200-1000 MG-UNIT CHEW, Chew 1 tablet by mouth daily., Disp: , Rfl:    CANNABIDIOL PO, Take 1 Dose by mouth 2 (two) times daily as needed (anxiety/sleep.). CBD Oil, Disp: , Rfl:    cetirizine (ZYRTEC) 10 MG tablet, Take 10 mg by mouth daily as needed (sinus/allergies.)., Disp: , Rfl:    cetirizine-pseudoephedrine (ZYRTEC-D) 5-120 MG tablet, Take 1 tablet by mouth daily as needed (sinus headaches.)., Disp: , Rfl:    clonazePAM  (KLONOPIN) 0.5 MG tablet, Take 0.5-1 tablets (0.25-0.5 mg total) by mouth daily as needed for anxiety., Disp: 90 tablet, Rfl: 1   cyanocobalamin (,VITAMIN B-12,) 1000 MCG/ML injection, INJECT 1 ML (1,000 MCG TOTAL) INTO THE MUSCLE EVERY 30 (THIRTY) DAYS. INJECT 1 ML (1,000 MCG) INTO THE MUSCLE EVERY 30 DAYS, Disp: 12 mL, Rfl: 0   FLUoxetine (PROZAC) 20 MG capsule, Take 1 capsule (20 mg total) by mouth daily., Disp: 90 capsule, Rfl: 3   fluticasone (FLONASE) 50 MCG/ACT nasal spray, Place 1-2 sprays into both nostrils daily as needed for allergies or rhinitis., Disp: , Rfl:    furosemide (LASIX) 20 MG tablet, Take 1 tablet (20 mg total) by mouth as needed for fluid., Disp: 90 tablet, Rfl: 0   Glycerin-Polysorbate 80 (REFRESH DRY EYE THERAPY OP), Place 1 drop into both eyes daily., Disp: , Rfl:    hydrocortisone (PROCTOZONE-HC) 2.5 % rectal cream, Place 1 application rectally 2 (two) times daily as needed (Crohn's flare)., Disp: 28 g, Rfl: 2   ibuprofen (ADVIL) 200 MG tablet, Take 400-800 mg by mouth every 8 (eight) hours as needed (pain.)., Disp: , Rfl:    mesalamine (PENTASA) 250 MG CR capsule, Take 250-500 mg by mouth 4 (four) times daily as needed (crohn's disease flare ups)., Disp: ,  Rfl:    Multiple Vitamins-Minerals (ICAPS AREDS 2 PO), Take 1 tablet by mouth daily., Disp: , Rfl:    prednisoLONE acetate (PRED FORTE) 1 % ophthalmic suspension, Place 1 drop into both eyes 2 (two) times daily as needed (Uveitis)., Disp: , Rfl:    Probiotic Product (PROBIOTIC PO), Take 1 capsule by mouth 2 (two) times a week. As needed, Disp: , Rfl:    traMADol (ULTRAM) 50 MG tablet, Take 1 tablet (50 mg total) by mouth every 6 (six) hours as needed., Disp: 20 tablet, Rfl: 0   traZODone (DESYREL) 50 MG tablet, Take 0.5-1 tablets (25-50 mg total) by mouth at bedtime as needed for sleep., Disp: 30 tablet, Rfl: 3   Treprostinil (TYVASO REFILL) 0.6 MG/ML SOLN, Inhale 54 mcg into the lungs in the morning, at noon, in the  evening, and at bedtime., Disp: 87 mL, Rfl: 11  Social History   Tobacco Use  Smoking Status Former   Packs/day: 1.00   Years: 45.00   Total pack years: 45.00   Types: Cigarettes   Quit date: 12/26/2008   Years since quitting: 13.5  Smokeless Tobacco Never    Allergies  Allergen Reactions   Victoza [Liraglutide] Shortness Of Breath   Metformin And Related Diarrhea    Increased moodiness   Onion Other (See Comments)    sereve diarrhea, stomach pains (Crohn's flares)   Lasix [Furosemide] Itching    Hypersensitivity - pt can take as needed    Pirfenidone Other (See Comments)    GI side effects   Potassium-Containing Compounds Other (See Comments)    Due to chron's, difficult passing though kidneys not allowing absorption in the body    Prednisone Other (See Comments)    Altered mood   Sulfa Drugs Cross Reactors Other (See Comments)    "feels like bugs crawling on me"   Objective:  There were no vitals filed for this visit. There is no height or weight on file to calculate BMI. Constitutional Well developed. Well nourished.  Vascular Dorsalis pedis pulses palpable bilaterally. Posterior tibial pulses palpable bilaterally. Capillary refill normal to all digits.  No cyanosis or clubbing noted. Pedal hair growth normal.  Neurologic Normal speech. Oriented to person, place, and time. Epicritic sensation to light touch grossly present bilaterally.  Dermatologic Hyperkeratotic lesion with central nucleated core noted.  Pain on palpation to the lesion.  Submetatarsal 5 lesion plantarflexed metatarsal noted  Orthopedic: Normal joint ROM without pain or crepitus bilaterally. No visible deformities. No bony tenderness.   Radiographs: None Assessment:   1. Porokeratosis    Plan:  Patient was evaluated and treated and all questions answered.  Left submetatarsal 5 porokeratosis -All questions and concerns were discussed with the patient in extensive detail.  I discussed shoe  gear modification offloading padding protective in extensive detail she states understanding will obtain them.  Using chisel blade handle lesion was debrided down to healthy striated tissue.  Followed by excision of central nucleated core.  Immediate relief was noted.  No complication noted.  No follow-ups on file.

## 2022-07-31 ENCOUNTER — Encounter: Payer: Self-pay | Admitting: Physician Assistant

## 2022-07-31 NOTE — Progress Notes (Signed)
Cardiology Office Note    Date:  08/01/2022   ID:  Patty [name], DOB [birth year], MRN [id number]  PCP:  [name], MD  Cardiologist:  Jenkins Rouge, MD  Electrophysiologist:  None   Chief Complaint: f/u CHF  History of Present Illness:   Patty [name] is a 71 y.o. female with history of ILD on with chronic respiratory failure home O2 with associated emphysema on CT followed by pulmonology, anxiety, arthritis, pulmonary HTN, chronic diastolic CHF, Crohn's disease, HLD (managed by primary care), polycycthema vera, pre-DM, pernicious anemia, OSA (followed by pulm), coronary/aortic atherosclerosis by CT (negative cath 2018) who is seen for follow-up. Of note, amyloidosis was added to her PMH by an LPN in 1517. There were no records that corroborate this and the patient did not recall ever being diagnosed with this.  She has a prior history of dyspnea on exertion. She had a false positive stress test in 2018 followed by cath showing no evidence of CAD. In more recent years she was seen by pulmonology and was found to have ILD and OSA, with evidence for hypoxia and tachycardia with exertion. Echo 06/2020 showed EF 60-65%, moderate LVH, grade 1 DD, visually RVF appeared reduced. She then underwent a Myoview stress test 10/22/20 which showed no ischemia or infarct. She historically did not tolerate taking Lasix well due to her Crohn's disease. She reported this was somewhat related to difficulty taking potassium that was required at the time. Pulmonology requested RHC to assess PA pressures for Tyvaso consideration. This was done 11/2020 showing moderate pulmonary HTN. RA 13/12 mean 9 mmHg RV 48/8, EDP 12 PA 50/29 mean 38 PCWP 22/20 mean 20 O2 saturations: PA 62, Ao 97, SVC 64 CO 4.26 L/m, CI 1.96 Trans-pulmonic gradient 18 mmHg PVR 4.2 Woods Units  She was subsequently started on Esbriet and Tyvaso by pulmonology. She did not tolerate the Esbriet but feels the Tyvaso has helped. I  met her in 11/2021. I had reviewed clinical scenario with Dr. Johnsie Cancel. He feels that prior RHC findings were suggestive moreso of underlying pulmonary disease since LVEDP was only 12. Dyspnea was felt primarily related to underlying pulmonary disease in setting of known ILD, pulmonary HTN, morbid obesity and deconditioning. Dr. Johnsie Cancel suggested that she try to use her standing Lasix more regularly to help regulate filling pressures. She previously reported difficulty losing weight over the years despite dieting. She also did not tolerate additional medications from the [place] as she reported they were too harsh on her GI system. She has continued with close f/u with pulmonology. They did set her up with palliative care as well. She also got a sewage situation tended to in her backyard from the church behind the easement. She felt her breathing improved when this situation improved. She also reports that her Crohn's seemed to improve with the Tyvaso as well. Repeat CT chest 03/2022 showed mild scarring of bilateral lung bases, enlargement of the PA, scattered coronary/aortic atherosclerosis and possible cirrhosis. She has elected not to pursue any evaluation for the liver changes. She returns for follow-up reporting she is largely stable compared to prior visit. No CP. SOB is about the same. Requires O2 with exertion. She has noticed she has to take her Lasix more regularly in the summer in the hotter weather.   Labwork independently reviewed: 01/2022 A1C 6.2 11/2021 TSH wnl with repeat normal free T4 (following abnormal TSH), CBC wnl, BNP wnl, BMET wnl, K 4.3, Cr 0.79 09/2021 K 4.1, Cr  0.79, AST/ALT OK, CBC wnl 01/2021 TSH wnl, A1c 6.4, LDL 120   Cardiology Studies:   Studies reviewed are outlined and summarized above. Reports included below if pertinent.   [place] 11/2020 Hemodynamic findings consistent with moderate pulmonary hypertension.   RA 13/12 mean 9 mmHg RV 48/8, EDP 12 PA 50/29 mean 38 PCWP  22/20 mean 20   O2 saturations: PA 62, Ao 97, SVC 64   CO 4.26 L/m, CI 1.96 Trans-pulmonic gradient 18 mmHg PVR 4.2 [place] Units   2D Echo 06/2020  1. Left ventricular ejection fraction, by estimation, is 60 to 65%. The  left ventricle has normal function. The left ventricle has no regional  wall motion abnormalities. There is moderate concentric left ventricular  hypertrophy. Left ventricular  diastolic parameters are consistent with Grade I diastolic dysfunction  (impaired relaxation).   2. Although TAPSE is normal, visually RVF appears reduced. The right  ventricular size is normal.   3. The mitral valve is normal in structure. No evidence of mitral valve  regurgitation. No evidence of mitral stenosis.   4. The aortic valve is normal in structure. Aortic valve regurgitation is  not visualized. No aortic stenosis is present.   5. The inferior vena cava is normal in size with greater than 50%  respiratory variability, suggesting right atrial pressure of 3 mmHg.     L+RHC 2018 1. No angiographic evidence of CAD 2. Elevated filling pressures   Recommendations: No further ischemic workup. Her filling pressures are elevated. She may benefit from diuretic therapy if she becomes symptomatic.     Past Medical History:  Diagnosis Date   Abnormal nuclear stress test    a. 07/2017: cath with no CAD   Anxiety    Aortic atherosclerosis (HCC)    Arthritis    Chronic diastolic CHF (congestive heart failure) (HCC)    Coronary artery calcification seen on CT scan    Crohn disease (HCC)    DVT (deep venous thrombosis) (HCC)    Dyspnea    Grade I diastolic dysfunction    Hyperlipidemia    ILD (interstitial lung disease) (HCC)    OSA on CPAP    CPAP    AND O2   Pernicious anemia    Polycythemia vera ([place])    Pulmonary embolism ([place]) 2010   s/p hip surgery , reports this was never truly confirmed    Pulmonary hypertension ([place])    Sleep disorder 01/10/2020   Uses klonopin for sleep  since 2014; sleep walking. And anxiety    Past Surgical History:  Procedure Laterality Date    RIGHT BREAST LUMPECTOMY WITH RADIOACTIVE SEED LOCALIZATION (Right Breast)  02/04/2021   BIOPSY  09/30/2019   Procedure: BIOPSY;  Surgeon: Jerene Bears, MD;  Location: [facility];  Service: Gastroenterology;;   BREAST LUMPECTOMY WITH RADIOACTIVE SEED LOCALIZATION Right 02/04/2021   Procedure: RIGHT BREAST LUMPECTOMY WITH RADIOACTIVE SEED LOCALIZATION;  Surgeon: Coralie Keens, MD;  Location: [facility];  Service: General;  Laterality: Right;   CARDIAC CATHETERIZATION     COLON RESECTION  1993   COLONOSCOPY WITH PROPOFOL N/A 09/30/2019   Procedure: COLONOSCOPY WITH PROPOFOL;  Surgeon: Jerene Bears, MD;  Location: [facility];  Service: Gastroenterology;  Laterality: N/A;   HIP ARTHROPLASTY Right    x 2, initial right THA, then subsequent right THA revision    LEFT HEART CATH AND CORONARY ANGIOGRAPHY N/A 07/28/2017   Procedure: Left Heart Cath and Coronary Angiography;  Surgeon: Burnell Blanks, MD;  Location:  [facility];  Service: Cardiovascular;  Laterality: N/A;   POLYPECTOMY  09/30/2019   Procedure: POLYPECTOMY;  Surgeon: Jerene Bears, MD;  Location: [facility];  Service: Gastroenterology;;   RIGHT HEART CATH N/A 12/07/2020   Procedure: RIGHT HEART CATH;  Surgeon: Sherren Mocha, MD;  Location: [facility];  Service: Cardiovascular;  Laterality: N/A;    Current Medications: Current Meds  Medication Sig   amoxicillin (AMOXIL) 500 MG capsule Take 2,000 mg by mouth See admin instructions. Before dental procedures   aspirin EC 81 MG tablet Take 81 mg by mouth daily.   Calcium Carbonate-Vit D-Min (CALCIUM 1200) 1200-1000 MG-UNIT CHEW Chew 1 tablet by mouth daily.   CANNABIDIOL PO Take 1 Dose by mouth 2 (two) times daily as needed (anxiety/sleep.). CBD Oil   cetirizine (ZYRTEC) 10 MG tablet Take 10 mg by mouth daily as needed (sinus/allergies.).    cetirizine-pseudoephedrine (ZYRTEC-D) 5-120 MG tablet Take 1 tablet by mouth daily as needed (sinus headaches.).   clonazePAM (KLONOPIN) 0.5 MG tablet Take 0.5-1 tablets (0.25-0.5 mg total) by mouth daily as needed for anxiety.   cyanocobalamin (,VITAMIN B-12,) 1000 MCG/ML injection INJECT 1 ML (1,000 MCG TOTAL) INTO THE MUSCLE EVERY 30 (THIRTY) DAYS. INJECT 1 ML (1,000 MCG) INTO THE MUSCLE EVERY 30 DAYS   FLUoxetine (PROZAC) 20 MG capsule Take 1 capsule (20 mg total) by mouth daily.   fluticasone (FLONASE) 50 MCG/ACT nasal spray Place 1-2 sprays into both nostrils daily as needed for allergies or rhinitis.   furosemide (LASIX) 20 MG tablet Take 1 tablet (20 mg total) by mouth as needed for fluid.   Glycerin-Polysorbate 80 (REFRESH DRY EYE THERAPY OP) Place 1 drop into both eyes daily.   hydrocortisone (PROCTOZONE-HC) 2.5 % rectal cream Place 1 application rectally 2 (two) times daily as needed (Crohn's flare).   ibuprofen (ADVIL) 200 MG tablet Take 400-800 mg by mouth every 8 (eight) hours as needed (pain.).   mesalamine (PENTASA) 250 MG CR capsule Take 250-500 mg by mouth 4 (four) times daily as needed (crohn's disease flare ups).   Multiple Vitamins-Minerals (ICAPS AREDS 2 PO) Take 1 tablet by mouth daily.   prednisoLONE acetate (PRED FORTE) 1 % ophthalmic suspension Place 1 drop into both eyes 2 (two) times daily as needed (Uveitis).   Probiotic Product (PROBIOTIC PO) Take 1 capsule by mouth 2 (two) times a week. As needed   traMADol (ULTRAM) 50 MG tablet Take 1 tablet (50 mg total) by mouth every 6 (six) hours as needed.   traZODone (DESYREL) 50 MG tablet Take 0.5-1 tablets (25-50 mg total) by mouth at bedtime as needed for sleep.   Treprostinil (TYVASO REFILL) 0.6 MG/ML SOLN Inhale 54 mcg into the lungs in the morning, at noon, in the evening, and at bedtime.      Allergies:   Victoza [liraglutide], Metformin and related, Onion, Lasix [furosemide], Pirfenidone, Potassium-containing  compounds, Prednisone, and Sulfa drugs cross reactors   Social History   Socioeconomic History   Marital status: Single    Spouse name: Not on file   Number of children: 0   Years of education: Not on file   Highest education level: Not on file  Occupational History   Occupation: retired  Tobacco Use   Smoking status: Former    Packs/day: 1.00    Years: 45.00    Total pack years: 45.00    Types: Cigarettes    Quit date: 12/26/2008    Years since quitting: 60.6  Smokeless tobacco: Never  Vaping Use   Vaping Use: Never used  Substance and Sexual Activity   Alcohol use: Yes    Comment: occ   Drug use: No   Sexual activity: Not Currently  Other Topics Concern   Not on file  Social History Narrative   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    Social Determinants of Health   Financial Resource Strain: Low Risk  (12/03/2020)   Overall Financial Resource Strain (CARDIA)    Difficulty of Paying Living Expenses: Not hard at all  Food Insecurity: No Food Insecurity (12/03/2020)   Hunger Vital Sign    Worried About Running Out of Food in the Last Year: Never true    Ran Out of Food in the Last Year: Never true  Transportation Needs: No Transportation Needs (12/03/2020)   PRAPARE - Hydrologist (Medical): No    Lack of Transportation (Non-Medical): No  Physical Activity: Insufficiently Active (12/03/2020)   Exercise Vital Sign    Days of Exercise per Week: 4 days    Minutes of Exercise per Session: 30 min  Stress: Stress Concern Present (12/03/2020)   [place]    Feeling of Stress : To some extent  Social Connections: Socially Isolated (12/03/2020)   Social Connection and Isolation Panel [NHANES]    Frequency of Communication with Friends and Family: Twice a week    Frequency of Social Gatherings with Friends and Family: Never    Attends  Religious Services: 1 to 4 times per year    Active Member of [employer] or Organizations: No    Attends Music therapist: Never    Marital Status: Never married     Family History:  The patient's family history includes Anxiety disorder in her father; COPD in her brother; Diabetes Mellitus II in her brother and father; Glaucoma in her brother and mother; Heart disease in her brother; High Cholesterol in her mother; High blood pressure in her mother; Non-Hodgkin's lymphoma in her brother; Sleep apnea in her father and mother. There is no history of Colon cancer, Esophageal cancer, Pancreatic cancer, Stomach cancer, or Liver disease.  ROS:   Please see the history of present illness.  All other systems are reviewed and otherwise negative.    EKG(s)/Additional Labs   EKG:  EKG is ordered today, personally reviewed, demonstrating NSR 88bpm, LAD, poor R wave progression with generally lower voltage  Recent Labs: 10/07/2021: ALT 17 11/30/2021: BUN 21; Creatinine, Ser 0.79; Hemoglobin 14.5; NT-Pro BNP 56; Platelets 329; Potassium 4.3; Sodium 141 12/13/2021: TSH 0.667  Recent Lipid Panel    Component Value Date/Time   CHOL 190 02/01/2021 1431   CHOL 178 01/21/2019 1321   TRIG 75.0 02/01/2021 1431   HDL 55.60 02/01/2021 1431   HDL 61 01/21/2019 1321   CHOLHDL 3 02/01/2021 1431   VLDL 15.0 02/01/2021 1431   LDLCALC 120 (H) 02/01/2021 1431   LDLCALC 102 (H) 01/21/2019 1321    PHYSICAL EXAM:    VS:  BP 132/76   Pulse 88   Ht 5' 4" (1.626 m)   Wt 269 lb (122 kg)   SpO2 94%   BMI 46.17 kg/m   BMI: Body mass index is 46.17 kg/m.  GEN: Well nourished, well developed female in no acute distress HEENT: normocephalic, atraumatic Neck: no JVD, carotid bruits, or masses Cardiac: RRR; no murmurs, rubs, or gallops,  no significant pitting edema Respiratory:  clear to auscultation bilaterally, normal work of breathing GI: soft, nontender, nondistended, + BS MS: no deformity or  atrophy Skin: warm and dry, no rash Neuro:  Alert and Oriented x 3, Strength and sensation are intact, follows commands Psych: euthymic mood, full affect  Wt Readings from Last 3 Encounters:  08/01/22 269 lb (122 kg)  02/01/22 266 lb (120.7 kg)  01/31/22 266 lb 9.6 oz (120.9 kg)     ASSESSMENT & PLAN:   1. Dyspnea on exertion, chronic - patient reports clinical stability since last visit. She does note some improvement ever since she got a sewage situation tended to as well where a church behind her house was spilling sewage into her yard. I previously had reviewed clinical scenario with Dr. Johnsie Cancel. He felt that prior RHC findings were suggestive moreso of underlying pulmonary disease since LVEDP was only 12. Symptoms have been felt related primarily to underlying pulmonary disease in setting of known ILD, pulmonary HTN, morbid obesity and deconditioning. Dr. Johnsie Cancel previously suggested that she try to use her standing Lasix more regularly to help regulate filling pressures. She is using this more frequently this summer so we will update her BMET today. She has close f/u with Dr. Chase Caller.  2. Moderate pulmonary HTN - in the context of ILD, morbid obesity, deconditioning. Per discussion with patient, she would like to pursue updated echocardiogram to reassess, will arrange. Will also update CBC with labs. Will await Dr. Golden Pop input on her stability, of whether he feels it would be beneficial for the patient to simultaneously follow with our AHF team for pulmonary HTN with Dr. Haroldine Laws or Dr. Aundra Dubin. Pulmonary team has been primarily managing this issue.   3. Chronic diastolic CHF - exam largely unchanged from prior. Would continue Lasix PRN as she is doing. She historically did not tolerate more regular dosing in the past. Check BMET today. The patient was instructed to monitor their blood pressure at home and to call if tending to run higher than 130/80. She reports normal values outside  the office.  4. Hyperlipidemia - patient declines further monitoring for this at this time.    Disposition: F/u with Dr. Johnsie Cancel in 6 months.   Medication Adjustments/Labs and Tests Ordered: Current medicines are reviewed at length with the patient today.  Concerns regarding medicines are outlined above. Medication changes, Labs and Tests ordered today are summarized above and listed in the Patient Instructions accessible in Encounters.   Signed, Charlie Pitter, PA-C  08/01/2022 2:38 PM    [place] Phone: [phone]; Fax: [phone]

## 2022-08-01 ENCOUNTER — Encounter: Payer: Self-pay | Admitting: Physician Assistant

## 2022-08-01 ENCOUNTER — Ambulatory Visit (INDEPENDENT_AMBULATORY_CARE_PROVIDER_SITE_OTHER): Payer: Medicare Other | Admitting: Physician Assistant

## 2022-08-01 VITALS — BP 132/76 | HR 88 | Ht 64.0 in | Wt 269.0 lb

## 2022-08-01 DIAGNOSIS — I272 Pulmonary hypertension, unspecified: Secondary | ICD-10-CM

## 2022-08-01 DIAGNOSIS — E785 Hyperlipidemia, unspecified: Secondary | ICD-10-CM

## 2022-08-01 DIAGNOSIS — R0609 Other forms of dyspnea: Secondary | ICD-10-CM | POA: Diagnosis not present

## 2022-08-01 DIAGNOSIS — I5032 Chronic diastolic (congestive) heart failure: Secondary | ICD-10-CM

## 2022-08-01 NOTE — Patient Instructions (Addendum)
Medication Instructions:  Your physician recommends that you continue on your current medications as directed. Please refer to the Current Medication list given to you today. *If you need a refill on your cardiac medications before your next appointment, please call your pharmacy*   Lab Work: TODAY-BMET, CBC If you have labs (blood work) drawn today and your tests are completely normal, you will receive your results only by: [place] (if you have MyChart) OR A paper copy in the mail If you have any lab test that is abnormal or we need to change your treatment, we will call you to review the results.   Testing/Procedures: Your physician has requested that you have an echocardiogram. Echocardiography is a painless test that uses sound waves to create images of your heart. It provides your doctor with information about the size and shape of your heart and how well your heart's chambers and valves are working. This procedure takes approximately one hour. There are no restrictions for this procedure.   Follow-Up: At [facility], you and your health needs are our priority.  As part of our continuing mission to provide you with exceptional heart care, we have created designated Provider Care Teams.  These Care Teams include your primary Cardiologist (physician) and Advanced Practice Providers (APPs -  Physician Assistants and Nurse Practitioners) who all work together to provide you with the care you need, when you need it.  We recommend signing up for the patient portal called "MyChart".  Sign up information is provided on this After Visit Summary.  MyChart is used to connect with patients for Virtual Visits (Telemedicine).  Patients are able to view lab/test results, encounter notes, upcoming appointments, etc.  Non-urgent messages can be sent to your provider as well.   To learn more about what you can do with MyChart, go to [email or web address].    Your next appointment:   6  month(s)  The format for your next appointment:   In Person  Provider:   Jenkins Rouge, MD     Other Instructions   Important Information About Sugar         Patients with history of high blood pressure should avoid stimulant-type medicines. This includes cold/allergy medicines that contain pseudoephedrine or phenylephrine. (Sometimes on the bottle it will say "-D" to indicate the decongestant, which you'll need to avoid.) Please make sure to pay attention to labels.

## 2022-08-02 LAB — BASIC METABOLIC PANEL
BUN/Creatinine Ratio: 29 — ABNORMAL HIGH (ref 12–28)
BUN: 20 mg/dL (ref 8–27)
CO2: 24 mmol/L (ref 20–29)
Calcium: 9.5 mg/dL (ref 8.7–10.3)
Chloride: 102 mmol/L (ref 96–106)
Creatinine, Ser: 0.69 mg/dL (ref 0.57–1.00)
Glucose: 141 mg/dL — ABNORMAL HIGH (ref 70–99)
Potassium: 4.5 mmol/L (ref 3.5–5.2)
Sodium: 141 mmol/L (ref 134–144)
eGFR: 93 mL/min/{1.73_m2} (ref >59)

## 2022-08-02 LAB — CBC
Hematocrit: 45.2 % (ref 34.0–46.6)
Hemoglobin: 14.4 g/dL (ref 11.1–15.9)
MCH: 29 pg (ref 26.6–33.0)
MCHC: 31.9 g/dL (ref 31.5–35.7)
MCV: 91 fL (ref 79–97)
Platelets: 334 10*3/uL (ref 150–450)
RBC: 4.96 x10E6/uL (ref 3.77–5.28)
RDW: 14.3 % (ref 11.7–15.4)
WBC: 7.3 10*3/uL (ref 3.4–10.8)

## 2022-08-03 ENCOUNTER — Encounter (INDEPENDENT_AMBULATORY_CARE_PROVIDER_SITE_OTHER): Payer: Self-pay

## 2022-08-08 ENCOUNTER — Telehealth: Payer: Self-pay | Admitting: Physician Assistant

## 2022-08-08 NOTE — Telephone Encounter (Signed)
Pt states she saw the message in MyChart and she has no additional questions so please disregard.

## 2022-08-08 NOTE — Telephone Encounter (Signed)
Ok

## 2022-08-08 NOTE — Telephone Encounter (Signed)
Pt returning a call for lab results

## 2022-08-10 ENCOUNTER — Encounter: Payer: Self-pay | Admitting: Internal Medicine

## 2022-08-10 NOTE — Telephone Encounter (Signed)
Dr. Chase Caller, please advise. Thanks.

## 2022-08-11 ENCOUNTER — Ambulatory Visit: Payer: Medicare Other | Admitting: Internal Medicine

## 2022-08-12 NOTE — Telephone Encounter (Signed)
I understand the predicament.   I do not mind starting clinic on August 19, 2022 at 1 PM seeing her OR even August 20 Monday at 1 PM.  I can also do August 26, 2022 Friday in the morning is 1 special patient [MR night float that night]

## 2022-08-17 NOTE — Telephone Encounter (Signed)
Pt responded back and said she was okay coming in Friday, 8/25 at 1:00 for appt. Appt scheduled.   Routing to MR as an FYI that clinic will be beginning at 1:00 Friday.

## 2022-08-18 ENCOUNTER — Ambulatory Visit (HOSPITAL_COMMUNITY): Payer: Medicare Other | Attending: Cardiology

## 2022-08-18 DIAGNOSIS — I5032 Chronic diastolic (congestive) heart failure: Secondary | ICD-10-CM | POA: Insufficient documentation

## 2022-08-18 DIAGNOSIS — R0609 Other forms of dyspnea: Secondary | ICD-10-CM | POA: Diagnosis not present

## 2022-08-18 DIAGNOSIS — E785 Hyperlipidemia, unspecified: Secondary | ICD-10-CM | POA: Diagnosis not present

## 2022-08-18 DIAGNOSIS — I272 Pulmonary hypertension, unspecified: Secondary | ICD-10-CM | POA: Insufficient documentation

## 2022-08-18 LAB — ECHOCARDIOGRAM COMPLETE
Area-P 1/2: 3.08 cm2
S' Lateral: 3 cm

## 2022-08-18 MED ORDER — PERFLUTREN LIPID MICROSPHERE
1.0000 mL | INTRAVENOUS | Status: AC | PRN
  Administered 2022-08-18 (×2): 2 mL via INTRAVENOUS

## 2022-08-18 NOTE — Patient Instructions (Addendum)
Chronic hypoxemic respiratory failure due to all of the below ILD (interstitial lung disease) ([place]) - progressive phenotype diagnosed  WHO group 3 pulmonary arterial hypertension ([place]) with elevaed PCWP to 20 on Dec 2021   L:ung function declined 17% in 3 years on FVC, fev1 and dlco. So finding it a bit odd that radiologist in feb 2023 and April 2023 feels you do not have ILD but have what is called ILA (interst lung abnormaliteis)   Glad rechallenge tyvaso is working well - currently at 4x/day - using 9 times each time and exercise tolerance better  Glad no more sewage exposure - I strongly believe this very likely played a role in deterioration last year  Seems clinically stable right now   Plan - try reaching out to [employer] attorney - continue tyvaso at curent dose - do spirometry and dlco in next 1-2 months to see current status --Continue oxygen 3-6 L nasal cannula pulsed with exertion and at night 2 L nasal cannula and room air at rest -Continue exercise program  -However continue to keep her pulse ox at greater than or equal to 88% using a pulse oximeter --Make attempts to lose weight - talk to your PCP [name], MD abut weight loss drugs - will discuss I our MDD case conference  OSA on CPAP  Stable  Plan  -Continue CPAP at ngith with 2L [place] at night  per the advice of his sleep doctor - at some point can check o2 level at night   Associated emphysema on CT  Plan  - supporitive care   Follow-up  -1-2  months s face to face with Dr Chase Caller but afte PFT; 30 min visit   -

## 2022-08-18 NOTE — Progress Notes (Signed)
_0  ID: Patty [name], female    DOB: [birth year], 71 y.o.   MRN: [id number]  Chief Complaint  Patient presents with   Follow-up    dyspnea     Referring provider: No ref. provider found  HPI: 71 year old female former smoker seen for pulmonary consulokay guarded because then maybe they can meet him right now for the registryt 05/09/2017 for progressive dyspnea since 2004.[name] Patient has polycythemia vera followed by hematology and [place] She has Crohn's disease    IOV  05/09/2017  Chief Complaint  Patient presents with   Pulmonary Consult    Pt referred for SOB with activity x years. Pt states over the last year the DOE has worsened. Pt denies cough and CP/tightness and f/c/s.     71 year old obese female. In 2004 Started noticing insidious onset of shortness of breath. In 2005 more [place] from [place] and probably gain over 60 pounds of weight. She has extensive workup at that time according to her history. She was then diagnosed with polycythemia by Dr. [name] at [facility]. She does not recollect any phlebotomies for this. However in the last 4-5 years she's had worsening shortness of breath. Noticeable with exertion particularly in the gym and doing standing exercises but not so much with sitting exercises. Relieved by rest. When she does some yard work she notices some associated wheezing as well.  Walking desaturation test in the office she did desaturate to 88% and did get tachycardic. She says that a chest x-ray done by primary care physicians interstitial pulmonary fibrosis. I personally visualized the chest x-ray done 04/03/2017: There is some interstitial markings but it is obscured by her obesity. I'm not so certain that is definite ILD. There is no lab work in the record. There is no PFT a CT scan available for visualization     05/29/2017 Follow up : Dyspnea  Patient returns for a two-week follow-up. Patient was seen for pulmonary consult  05/09/2017 for progressive dyspnea since 2004. Patient was set up for a high resolution CT chest that showed a very mild basilar predominant subpleural reticulation and bronchiectasis which could be due to nonspecific interstitial pneumonitis.. CT did have incidental findings of enlarged pulmonary arteries and aortic and coronary calcification. Patient had pulmonary function test on May 23 that showed an FEV1 at 77%, ratio 73, FVC 81, no significant bronchodilator response, total lung capacity 100%, DLCO 52%. Overnight oximetry test did show significant desaturations. We discussed beginning oxygen at bedtime.  Patient denies significant cough. Says that she get short of breath with walking. Has daytime fatigue and low energy..  Interstitial lung disease patient questionnaire was completed as follows Patient has no previous use of Macrodantin, amiodarone, methotrexate. She has been treated with prednisone for her Crohn's in the past. Patient has had extensive travel with brief visits to multiple state and country's.. [place]. Did live in a apartment that she did notice mold for 4 years. She does have Crohn's disease and was on Mesamaline for many years. Patient says she has had a humidifier and hot tub and previous house. She has no indoor birds. She does have a dog. Patient says she was told that she had a blood clot in the past but was never found.   OV 08/15/2017  Chief Complaint  Patient presents with   Follow-up    Pt still gets occ. SOB. Other than that, pt states that she has been doing  good. Denies any cough or CP.     Follow-up multifactorial dyspnea with interstitial lung disease concern.   Regarding interstitial lung disease concern: Mid May 2018 she had high resolution CT chest that showed interstitial lung disease very mild but unclear if possible UIP pattern. Autoimmune test was negative. Isolated reduction in diffusion capacity 50%. [employer] of chest  physicians questionnaire shows previous Crohn's disease and also mold exposure previously. Overall she feels stable  Regarding dyspnea: She underwent echocardiogram in June 2018 showed grade 1 diastolic dysfunction. Had abnormal cardiac stress test July 2018 followed by cardiac cath early August 2018 that showed normal coronary angiogram but elevated left ventricular end-diastolic pressure. Dietary therapy has been recommended but she is unaware of these results.  Overall she is here to discuss these results.  IMPRESSION: 1. Suspect very mild basilar predominant subpleural reticulation and bronchiolectasis, which may be due to nonspecific interstitial pneumonitis. 2. Aortic atherosclerosis (ICD10-170.0). Coronary artery calcification. 3. Enlarged pulmonary arteries, indicative of pulmonary arterial hypertension.     Electronically Signed   By: Lorin Picket M.D.   On: 05/13/2017 07:33    OV 10/17/2017  Chief Complaint  Patient presents with   Follow-up    Pt states that she has had good days and bad days since last visit. States that when she was in [place] for a month, breathing was better; still became SOB but not as bad as in [place]. Pt's SOB is mainly on exertion. Denies any cough or CP.    Follow-up dyspnea that is multifactorial due to obesity, physical deconditioning and diastolic dysfunction Follow-up mild interstitial lung disease not otherwise specified  Last visit I started her on Lasix. I was only supposed to see herin 6 months or so. However she's had problems tolerating Lasix and potassium. It fluctuates between palpitations and hypertension. She feels the palpitations might be related to potassium depletion after taking Lasix. But she also tells me that the Lasix does help her dyspnea. At this point in time her condition is to see cardiologist. Off note she did send the email message asking these questions on 09/28/2017 made a reply that she tells me that she never got  the reply.   OV 02/13/2018  Chief Complaint  Patient presents with   Follow-up    PFT done 02/05/18.  Pt states she has been doing good. Was sick in january but states she is doing better.  DME: [facility] 4L pulse    Follow-up dyspnea that is multifactorial due to obesity, physical deconditioning and diastolic dysfunction Follow-up mild interstitial lung disease not otherwise specified v concern   71 year old female with concern for interstitial lung disease.  Since her last visit she continues to do well.  She is attending pulmonary rehabilitation.  She feels better and less short of breath.  She uses oxygen with exertion at rehab saying she needs it.  She has upcoming travel in summer to [place] and wanted some flight information oxygen form filled out.  She is wondering if mesalamine that she took several years ago was the cause of interstitial lung disease or her Crohn's disease.  But overall she is stable.  Pulmonary function test shows stability and FVC since last 1 year with some improvement in DLCO.  High-resolution CT scan of the chest interpreted by thoracic radiology report suspected ILD.   OV 08/28/2018  Chief Complaint  Patient presents with   Follow-up    PFT performed today.  Pt states she has had both good and  bad days since last visit. Pt denies any complaints of cough, SOB, or CP but states she is having some problems with congestion since she has been back from vacation. Pt has been having problems with getting O2 supplies taken care of with DME.    Follow-up dyspnea that is multifactorial due to obesity, physical deconditioning and diastolic dysfunction Follow-up concern for  mild interstitial lung disease not otherwise specified  Patty [name] - Presents for follow-up for the above issues. Her dyspnea significantly better following a 30 pound weight loss and continued rehabilitation. She uses oxygen with rehabilitation. Today walking desaturation test 185 feet 3 laps on room  air: She dropped to 87% on the second lap. But she is feeling better. She had spirometry today that shows improvement in FVC concomitant with weight loss but no change in diffusion capacity that reflects the presence of ongoing possible ILD.She continues to lose more weight she believes that she could lose another 30 pounds before the next visit.     OV 03/12/2019  Subjective:  Patient ID: Patty [name], female , DOB: 05-01-1951 , age 14 y.o. , MRN: [id number] , ADDRESS: [place] [id number]   03/12/2019 -   Chief Complaint  Patient presents with   Follow-up    PFT performed today.  Pt states she has been doing good since last visit. States she still becomes SOB with exertion, and has phlegm in the mornings, postnasal drainage which has used flonase to help. Pt also uses POC as needed.    Follow-up dyspnea that is multifactorial due to obesity, physical deconditioning and diastolic dysfunction Follow-up  mild interstitial lung disease not otherwise specifieds -with stability 2018 through 2020 [non-IPF pattern]; on observation   HPI Patty [name] 71 y.o. -presents for follow-up.  She is attending pulmonary rehabilitation and using oxygen with exertion.  Overall she feels stable.  Subjective symptom parameters and objective parameters are documented below.  She had high-resolution CT chest that showed shows that she has ILD.  At this point we can be confident that she has mild ILD but it is stable based on symptoms walking test and pulmonary function test.  The previous autoimmune test was negative.      OV 10/07/2019  Subjective:  Patient ID: Patty [name], female , DOB: 01-Jun-1951 , age 69 y.o. , MRN: [id number] , ADDRESS: [place] [id number]   10/07/2019 -   Chief Complaint  Patient presents with   Follow-up     Follow-up dyspnea that is multifactorial due to obesity, physical deconditioning and diastolic dysfunction Follow-up  mild  interstitial lung disease not otherwise specifieds -with stability 2018 through 2020 [non-IPF pattern]; on observation Follow-up mild pulmonary emphysema present on high-resolution CT chest  HPI Patty [name] 71 y.o. -presents for follow-up.  Prior visit was pre-pandemic.  Since then she has been sedentary at home.  She tells me that she is more short of breath.  The symptom scores below reflect that.  She thinks is because of the 30 pound weight gain because of sedentary living and social isolation following the onset of the pandemic.  She is frustrated by this.  Deep down she thinks her ILD is stable.  Pulmonary function test shows a drop in FVC but stability and DLCO suggesting weight gain is an ongoing issue for worsening dyspnea and pulmonary function test.  She is willing to have a high-dose flu shot today.  She also tells me that she is not taking  her Spiriva because it was expensive.  Although it did help her she is willing to try again and try to price it.  She also stated that her mom who never smoked had emphysema but her dad smoked heavily.  She has never been tested for alpha-1.   [employer] [place] -dropped. But tries not to use it in public    IMPRESSION: 1. Very mild scattered basilar subpleural reticulation and ground-glass similar to 01/29/2018. Nonspecific interstitial pneumonitis can not be excluded. Findings are indeterminate for UIP per consensus guidelines: Diagnosis of Idiopathic Pulmonary Fibrosis: An Official ATS/ERS/JRS/ALAT Clinical Practice Guideline. [place], Iss 5, [id number], Aug 26 2017. 2. Question cirrhosis. 3. Aortic atherosclerosis (ICD10-170.0). Coronary artery calcification. 4. Enlarged arteries, arterial hypertension. 5.  Emphysema (ICD10-J43.9).     Electronically Signed   By: Lorin Picket M.D.   On: 03/06/2019 16:59   ROS - per HPI     OV 06/23/2020  Subjective:  Patient ID: Patty [name], female ,  DOB: 1951-09-24 , age 66 y.o. , MRN: [id number] , ADDRESS: [address] [place] [address]   06/23/2020 -   Chief Complaint  Patient presents with   Follow-up    shortness of breath with exertion   Follow-up dyspnea that is multifactorial due to obesity, physical deconditioning and diastolic dysfunction  Follow-up  mild interstitial lung disease not otherwise specifieds -with stability 2018 through 2020 [non-IPF pattern/indeterminate   -; on observation  - last CT Mach 2020 0> July 2021 wthout progression  Follow-up mild pulmonary emphysema present on high-resolution CT chest - alpha 1 MM  - o2 with exertion  - advised spiriva oct 2020 but too expensive  HPI Patty [name] 71 y.o. -returns for routine follow-up.  She says she has been doing well.  She uses portable oxygen cranks it up to 4 L - 5 L and then walks every day for 30 minutes.  Overall she feels stable.  Her symptom score itself is stable.  However on pulmonary function testing her FVC shows a decline.  This decline started in October 2020 and since then it is stable.  She attributes this to weight gain.  There is what she told me last time as well.  However when we walked her she seemed to desaturate much more easily.  We also needed to corrected desaturation at 5 L.  She thinks it is because the portable oxygen system is not delivering oxygen currently and is a technical issue with a portable machine.  However the distance of desaturation seems to worsen.  There is no leg swelling although she has some mild varicose veins at baseline.  She is not on nighttime oxygen and she recollects attest that this some years ago and was normal back then.  Currently she is only using portable oxygen.  Her last echocardiogram was few years ago.  There is no hemoptysis or worsening cough.  She got a Covid vaccine in February 2021 and she thinks things might of changed since then.   After she left I was able to review the home  palliative care note from 01/11/2022:.  She expressed her life wishes that she does not want to be put on the ventilator.  Does not want a feeding tube.  She likes to pass away peacefully like her mother a DNR and MOST form were given.  They will address this in the future with her.  Results for JAMARIYAH, JOHANNSEN ANN (MRN [id number]) as  of 06/23/2020 11:49  Ref. Range 05/17/2017 13:11 02/05/2018 12:14 08/28/2018 11:22 03/12/2019 14:47 10/01/2019 15:58 30 pound weight gain 06/09/2020 11:25  FVC-Pre Latest Units: L 2.61 2.57 2.80 2.73 2.38 2.42  FVC-%Pred-Pre Latest Units: % 84 83 94 91 81 82  FEV1-Pre Latest Units: L 1.83 1.71 1.96 1.83 1.65 1.53   Results for ANNE, BOLTZ ANN (MRN [id number]) as of 06/23/2020 11:49  Ref. Range 05/17/2017 13:11 02/05/2018 12:14 08/28/2018 11:22 03/12/2019 14:47 10/01/2019 15:58 06/09/2020 11:25  DLCO unc Latest Units: ml/min/mmHg 12.64 15.15 14.53 13.77 14.39 13.48  DLCO unc % pred Latest Units: % 52 62 62 72 75 70    ROS - per HPI     OV 02/23/2021  Subjective:  Patient ID: Patty [name], female , DOB: [birth year] , age 72 y.o. , MRN: [id number] , ADDRESS: [place] [id number] PCP [name], MD Patient Care Team: [name], MD as PCP - General (Family Medicine) Pyrtle, Lajuan Lines, MD as Consulting Physician (Gastroenterology) Shamleffer, Melanie Crazier, MD as Consulting Physician (Endocrinology) Brand Males, MD as Consulting Physician (Pulmonary Disease)  This Provider for this visit: Treatment Team:  Attending Provider: Brand Males, MD   Follow-up dyspnea that is multifactorial due to obesity, physical deconditioning and diastolic dysfunction, ILD and new dx Group 3 PAH - dec 2021  45 pppd prior smoking hix  Follow-up  mild interstitial lung disease not otherwise specifieds -with stability 2018 through 2020 [non-IPF pattern/indeterminate   -; on observation  - last CT Mach 2020 - > July 2021 without  progression  Inconsisent dxx of  pulmonary emphysema present on high-resolution CT chest - alpha 1 MM  - o2 with exertion  - advised spiriva oct 2020 but too expensive  Reported in only 1 CT. No mention in July 2021 CT  WHO group 3 Pulm htn with elevated PCWD - [facility] 12/07/20:   RA 13/12 mean 9 mmHg RV 48/8, EDP 12 PA 50/29 mean 38 PCWP 22/20 mean 20   O2 saturations: PA 62, Ao 97, SVC 64   CO 4.26 L/m, CI 1.96 Trans-pulmonic gradient 18 mmHg PVR 4.2 Woods Units  Grade 1 Disast dysfhn  - reportred July 2021 echo  Obesity  02/23/2021 -   Chief Complaint  Patient presents with   Follow-up    Doing ok     HPI Patty [name] 71 y.o. -returns for 36-monthfollow-up.  She has inconsistent diagnosis of emphysema.  Was reported in 1 scan but not another scans.  She has ILD.  She is foregoing biopsy.  She tells me overall that she is stable.  Although there are some weight gain.  She feels in the interim Metformin messed up her GI system and cause weight gain but she is glad to be out of it.  She had 6-minute walk test on 02/15/2021 at pulmonary rehabilitation.  She walked 7 and 15 feet in 6 minutes.  She did not break at all.  Her pulse ox dropped below 88% 2 times.  She corrected with 4 L of oxygen.  Her resting oxygen saturation was 96%.  And lowest was 86%.  She also had right heart catheterization in December 2021.  She has elevated pulmonary pressures but also elevated wedge.  Cardiology is following this up and she is on diuresis.  We spent some time discussing her care option.  She had pulmonary function test that shows stability in the last year and a half but declined in 2  years time.  Her high-resolution CT scan of the chest done in summer 2021 shows stability over 4 years.  She is not interested in antifibrotic's because of her colitis and also given her stability.  She does not want to go through pulmonary lung biopsy given the risks  She generally tries to avoid  medications.  She is happy going to pulmonary rehabilitation.  We discussed inhaled treprostinil as approved therapy for pulmonary hypertension -expressed to her the overall safety profile for over 20 years and approved recently for pulmonary hypertension and WHO group 3.  Explained the minimal side effect risk but she does not want to go through this  We discussed the option of participating in a clinical trial.  We discussed what is called the PULSE inhaled nitric oxide device study.  This is a 28-week plus study.  It was a device and a nitric oxide supply study.  She is actually interested in this.  She is participating clinical trials.  She understands the voluntary nature of this.  She understands the risks that come with clinical trials.  She understands the control of risk through close follow-up and interventions..  However her 6-minute walk test is quite adequate and also she starting pulmonary rehabilitation so she would not be able to participate in this trial for several months.  In addition she is aware that inhaled treprostinil which is standard of care therapy is currently on exclusion in this protocol current amendment.  She understands if she qualifies and chooses this trial that this would be a limitation.  We have given her the consent for review but at this time she does not qualify for this trial.  HRCT July 2021   IMPRESSION: 1. Spectrum of findings suggestive of a mild basilar predominant fibrotic interstitial lung disease without appreciable interval progression since baseline 05/12/2017 high-resolution chest CT. Favor NSIP, with UIP not excluded. Findings are indeterminate for UIP per consensus guidelines: Diagnosis of Idiopathic Pulmonary Fibrosis: An Official ATS/ERS/JRS/ALAT Clinical Practice Guideline. [place], Iss 5, [phone], Aug 26 2017. 2. Dilated main pulmonary artery, stable, suggesting chronic pulmonary arterial hypertension. 3. Aortic  Atherosclerosis (ICD10-I70.0).     Electronically Signed   By: Ilona Sorrel M.D.   On: 07/10/2020 15:50     OV 05/31/2021  Subjective:  Patient ID: Patty [name], female , DOB: [birth year] , age 8 y.o. , MRN: [id number] , ADDRESS: [place] [phone] PCP [name], MD Patient Care Team: [name], MD as PCP - General (Family Medicine) Pyrtle, Lajuan Lines, MD as Consulting Physician (Gastroenterology) Shamleffer, Melanie Crazier, MD as Consulting Physician (Endocrinology) Brand Males, MD as Consulting Physician (Pulmonary Disease)  This Provider for this visit: Treatment Team:  Attending Provider: Brand Males, MD    Follow-up dyspnea that is multifactorial due to obesity, physical deconditioning and diastolic dysfunction, ILD and new dx Group 3 PAH - dec 2021  45 pppd prior smoking hix  Follow-up  mild interstitial lung disease not otherwise specifieds -with stability 2018 through 2020 [non-IPF pattern/indeterminate   -on observation  -last CT Mach 2020 - > July 2021 without progression  Inconsisent dxx of  pulmonary emphysema present on high-resolution CT chest - alpha 1 MM  - o2 with exertion  - advised spiriva oct 2020 but too expensive  Reported in only 1 CT. No mention in July 2021 CT  WHO group 3 Pulm htn with elevated PCWD - [facility] 12/07/20:  RA 13/12 mean 9 mmHg RV 48/8, EDP 12 PA 50/29 mean 38 PCWP 22/20 mean 20   O2 saturations: PA 62, Ao 97, SVC 64   CO 4.26 L/m, CI 1.96 Trans-pulmonic gradient 18 mmHg PVR 4.2 Woods Units  Grade 1 Disast dysfhn  - reportred July 2021 echo  Obesity   05/31/2021 -   Chief Complaint  Patient presents with   Follow-up    Pt states she has been doing okay since last visit. States she has begun pulmonary rehab which has helped.     HPI Patty [name] 71 y.o. -returns for follow-up.  She continues on supportive care with oxygen.  Room air at rest 3-5 L pulsed with  oxygen.  For the last few months she is on pulsed oxygen.  She uses 2 L nasal cannula at night with her CPAP.  She is completed pulmonary rehabilitation.  For the last few months she is using pulsed oxygen.  She feels this works better.  She is [facility] [employer] [facility].  She does elliptical or treadmill.  She feels her symptoms have improved.  She is continue to gain some weight though.  She again reported that she feels sensitive to medication and wants avoid antifibrotic's.  She was interested this time and discussing treprostinil versus pulsed research protocol for patients with interstitial lung disease and hypoxemic respiratory failure.  She has pulmonary hypertension based on 2021 December right heart catheterization.  We discussed the long-term safety profile of inhaled treprostinil.  Discussed the side effect profile.  After reflection about the fact the increase study showed improvement in 6-minute walk test, subgroup analysis potential modification of ILD and other composite outcome improvement.     IMPRESSION: 1. Spectrum of findings suggestive of a mild basilar predominant fibrotic interstitial lung disease without appreciable interval progression since baseline 05/12/2017 high-resolution chest CT. Favor NSIP, with UIP not excluded. Findings are indeterminate for UIP per consensus guidelines: Diagnosis of Idiopathic Pulmonary Fibrosis: An Official ATS/ERS/JRS/ALAT Clinical Practice Guideline. [place], Iss 5, [phone], Aug 26 2017. 2. Dilated main pulmonary artery, stable, suggesting chronic pulmonary arterial hypertension. 3. Aortic Atherosclerosis (ICD10-I70.0).     Electronically Signed   By: Ilona Sorrel M.D.   On: 07/10/2020 15:50   OV 09/07/2021  Subjective:  Patient ID: Patty [name], female , DOB: April 09, 1951 , age 64 y.o. , MRN: [id number] , ADDRESS: [place] [id number] PCP [name], MD Patient Care  Team: [name], MD as PCP - General (Family Medicine) Hilarie Fredrickson, Lajuan Lines, MD as Consulting Physician (Gastroenterology) [facility], Melanie Crazier, MD as Consulting Physician (Endocrinology) Brand Males, MD as Consulting Physician (Pulmonary Disease)  This Provider for this visit: Treatment Team:  Attending Provider: Brand Males, MD  Chronic hypoxemic respiratory failure due to all of the below ILD (interstitial lung disease) [facility])  -  Decline PF March 2020 through February 2022  but stable October 2020 through February 2022 on pulmonary function test -Stable on CT scan of the chest 2018 through July 2021 -Non-IPF likely - such as NSIP -Overall supportive care of plan without antifibrotic's given history of colitis [antifibrotic's can exacerbate this situation] and prior stability -You are using room air at rest with 3-5 L with exertion -pulse oxygen for the last few months    WHO group 3 pulmonary arterial hypertension ([place]) with elevaed PCWP to 20 on Dec 2021 - started tyvaso July 2022  [place] group 3  pulmonary arterial hypertension ([place]) with elevaed PCWP to 20 on Dec 2021 - -Continue CPAP at ngith with 2L [place] at night  09/07/2021 -   Chief Complaint  Patient presents with   Follow-up    PFT performed today.  States that she still becomes SOB with exertion. Pt is now on Tyvaso and states she finds herself needing her O2 more.     HPI Patty [name] 71 y.o. -returns for follow-up.  She started treprostinil in July 2022.  She says initially started doing better.  But once she got to 7 puffs 4 times daily she started getting more short of breath.  She felt overall that she is able to take a deep breath and feel less fatigued but when she got to that dose she said 1 time she walked across the room without oxygen which she normally walks without oxygen and she felt dizzy and winded.  Since then the symptoms have persisted.  She is also gained significant amount of  weight.  She is gained 16 pounds.  Her pulmonary function test is worse with a decline in both FVC and DLCO.  She says she is now needing 6 L of nasal cannula oxygen when she exercises.  Previously 5 L nasal cannula.  Symptom score wise and walking desaturation test wise she seems the same.  She does not want to do antifibrotic's because of prior history of colectomy.  At this point she is not sure if the treprostinil is helping her or hurting her or not doing anything.  OV 10/19/2021  Subjective:  Patient ID: Patty [name], female , DOB: Aug 03, 1951 , age 57 y.o. , MRN: [id number] , ADDRESS: [place] [id number] PCP [name], MD Patient Care Team: [name], MD as PCP - General (Family Medicine) Hilarie Fredrickson, Lajuan Lines, MD as Consulting Physician (Gastroenterology) [facility], Melanie Crazier, MD as Consulting Physician (Endocrinology) Brand Males, MD as Consulting Physician (Pulmonary Disease)  This Provider for this visit: Treatment Team:  Attending Provider: Brand Males, MD   Chronic hypoxemic respiratory failure due to all of the below ILD (interstitial lung disease) ([place]) - 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 - diagnosed Sept 2022 -Non-IPF likely - such as NSIP -  Decline PF March 2020 through February 2022  but stable October 2020 through February 2022  -> declined sept 2022 PFT -Stable on CT scan of the chest 2018 through July 2021 -You are using room air at rest with 3-5 L with exertion -pulse oxygen for the last few months - started esbriet     WHO group 3 pulmonary arterial hypertension ([place]) with elevaed PCWP to 20 on Dec 2021 - started tyvaso July 2022 -> stopped sept 2022 of concern PFT declined could ave been due to tyvaso -> but restarted later in sept 2022   [facility] group 3 pulmonary arterial hypertension ([place]) with elevaed PCWP to 20 on Dec 2021 - -Continue CPAP at ngith with 2L [place] at night   10/19/2021 -  followup  ILD   Type of visit: Telephone/Video Circumstance: COVID-19 national emergency Identification of patient Jeania Nater with May 08, 1951 and MRN [id number] - 2 person identifier Risks: Risks, benefits, limitations of telephone visit explained. Patient understood and verbalized agreement to proceed Anyone else on call: just patient Patient location: [phone] - her cell.  At her home This provider location: [place] 71 y.o. - esbriet still pending. BAck on tyvaso since  mid-late sept 2022. Now doing it 4x/day - 5x/each time.   Also reports sewage leak since April/May 2022 Occassional odor. Some leakage through cleanup pipe. FEels this has made her respiration worse instead of tyvaso. Initialy she thought was tyvaso but later realized might have been due to sweage leak and finally fixed in sept 2022.She feels after long term stability things got worse because of sweage leak. She feels since leak fixed (Same time went back on tyvaso) she is better. In gym mets were down to 2.1 but now upto 2.4 mets (baseline 2.7 mets). NExt visit with mid dec 2022.   She wants to know if progression is perrmanent  Esbriet sill in process. Arriving in 2 days. She wil start at 1 pill tid x 1 week -> 2 pill tid and maintain  She can do visit 11/04/21 - 3.00pm - with [name] - video to assess followup on esbriet    OV 11/11/21 - video APP  71 yo female former smoker followed for ILD and Emphysema  Medical history significant for Crohn's Disease and DCHF   Today's video visit is a 1 month follow up for ILD . She has recently started on Esbriet 2 weeks ago, does notice some symptoms of fatigue , joint aches and fatigue. Started on Esbriet 267 mg 2 tab Twice daily, this week. No v/d. Some nausea . No bloody stools. Some intermittent pain under right ribs on/off. No urinary issues. No syncope.  Remains on Tyvaso Four times a day   Remains on Oxygen 6l/m with activity and  2l/m At bedtime  . None at rest  No change in O2 requirements   OV 12/07/2021  Subjective:  Patient ID: Patty [name], female , DOB: April 18, 1951 , age 47 y.o. , MRN: [id number] , ADDRESS: [place] [id number] PCP [name], MD Patient Care Team: [name], MD as PCP - General (Family Medicine) Josue Hector, MD as PCP - Cardiology (Cardiology) Pyrtle, Lajuan Lines, MD as Consulting Physician (Gastroenterology) [facility], Melanie Crazier, MD as Consulting Physician (Endocrinology) Brand Males, MD as Consulting Physician (Pulmonary Disease)  This Provider for this visit: Treatment Team:  Attending Provider: Brand Males, MD    12/07/2021 -   Chief Complaint  Patient presents with   Follow-up    PFT performed 12/12. Pt states she began Esbriet November 2022 and said that she has had different side effects from the med.    Chronic hypoxemic respiratory failure due to all of the below ILD (interstitial lung disease) ([place]) - 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 - diagnosed Sept 2022 -Non-IPF likely - such as NSIP -  Decline PF March 2020 through February 2022  but stable October 2020 through February 2022  -> declined sept 2022 PFT -Stable on CT scan of the chest 2018 through July 2021 -You are using room air at rest with 3-5 L with exertion -pulse oxygen for the last few months - started esbriet  oct/nov 2022 - stopped GI intolerance 11/29/21    WHO group 3 pulmonary arterial hypertension ([place]) with elevaed PCWP to 20 on Dec 2021 - started tyvaso July 2022 -> stopped sept 2022 of concern PFT declined could ave been due to tyvaso -> but restarted later in sept 2022   WHO group 3 pulmonary arterial hypertension ([place]) with elevaed PCWP to 20 on Dec 2021 - -Continue CPAP at ngith with 2L  at  HPI Patty [name] 71 y.o. -at last visit we started  pirfenidone but as soon as she went up to 2 pills 3 times daily she started having  significant side effects including drop in urine output therefore she stopped.  Most of the side effects are GI.  Present even at 1 pill 3 times daily.  She says stopped pirfenidone yesterday and ever since then beginning to feel better especially today.  Continues on oxygen at the same level.  Is on inhaled treprostinil nebulizer.  Currently increased it to 6 times at 4 times a day.  Slowly going to work her self up.  She held off on escalating this while she was on pirfenidone start.  We looked at her file-per registry but need clarification from radiology about whether she has traction bronchiectasis.  In addition need to know the current status of her polycythemia.  Last CT scan of the chest was in 2021.  We discussed about the fact that her ILD is progressing.  She is lives alone.  No siblings in the city parents are deceased.  No children.  She has neighbors.  She is assigned to healthcare for her 64 her brother in [place].  She says she regularly updates him.  We did not discuss CODE STATUS.  Offered home palliative care and she is willing.  She knows that she needs to get medical alert.       OV 02/01/2022  Subjective:  Patient ID: Patty [name], female , DOB: 09-25-1951 , age 80 y.o. , MRN: [id number] , ADDRESS: [place] [id number] PCP [name], MD Patient Care Team: [name], MD as PCP - General (Family Medicine) Josue Hector, MD as PCP - Cardiology (Cardiology) Pyrtle, Lajuan Lines, MD as Consulting Physician (Gastroenterology) [facility], Melanie Crazier, MD as Consulting Physician (Endocrinology) Brand Males, MD as Consulting Physician (Pulmonary Disease)  This Provider for this visit: Treatment Team:  Attending Provider: Brand Males, MD    02/01/2022 -   Chief Complaint  Patient presents with   Follow-up    Pt states she is about the same since last visit.    HPI Patty [name] 71 y.o. -returns for follow-up.  She  tells me the inhaler presently is working really well for her.  She feels that given the Crohn's disease is better.  Her exercise tolerance on the treadmill is better.  Is increased to 2.5 METS.  Her shortness of breath score is also improved.  There is normal sewage exposure in the house.  She feels her lungs are improving.  She had a high-resolution CT scan of the chest.  Dr. Rosario Jacks the radiologist says there is ILA and not ILD.  She [as well as myself] no perplexed by this.  We looked at the CT scans ourselves.  In my view this may be early ILD.  It might be slightly worse in 2018.  Looking at lung function test status greater than 15% total decline in 3 years between 2019 and 2022.  However we do agree that the interstitial findings on the CT scan are mild.  Also she does not feel that she has significant emphysema.  She feels the CT report is over waiting the emphysema.  She says even though she smoked it was not a true pack per day.  She says the Spiriva did not help her.  Therefore she is a little bit perplexed with the emphysema diagnosis on the CT scan.  She and I agreed that we will discuss in a case conference.  I did submit her name for this.  At this point in time she is not on antifibrotic's and prefers not to be on it.  She is content being on treprostinil.     CT Chest data  CT Chest High Resolution  Result Date: 01/31/2022 CLINICAL DATA:  Interstitial lung disease, former smoker. EXAM: CT CHEST WITHOUT CONTRAST TECHNIQUE: Multidetector CT imaging of the chest was performed following the standard protocol without intravenous contrast. High resolution imaging of the lungs, as well as inspiratory and expiratory imaging, was performed. RADIATION DOSE REDUCTION: This exam was performed according to the departmental dose-optimization program which includes automated exposure control, adjustment of the mA and/or kV according to patient size and/or use of iterative reconstruction technique.  COMPARISON:  07/10/2020. FINDINGS: Cardiovascular: Atherosclerotic calcification of the aorta, aortic valve and coronary arteries. Pulmonic trunk is enlarged. Heart size within normal limits. No pericardial effusion. Mediastinum/Nodes: No pathologically enlarged mediastinal or axillary lymph nodes. Hilar regions are difficult to definitively evaluate without IV contrast but appear grossly unremarkable. Esophagus is grossly unremarkable. Lungs/Pleura: Centrilobular emphysema. Negative for subpleural reticulation, traction bronchiectasis/bronchiolectasis, ground-glass, architectural distortion or honeycombing. Subpleural densities and ground-glass seen on supine imaging largely clear on prone imaging. Mild bibasilar scarring and bronchiectasis. No pleural fluid. Airway is unremarkable. No air trapping. Upper Abdomen: Liver margin is slightly irregular. Visualized portions of the liver, gallbladder, adrenal glands and right kidney are otherwise unremarkable. 1.4 cm exophytic low-attenuation lesion off the interpolar left kidney is likely a cyst. Visualized portions of the spleen, pancreas, stomach and bowel are grossly. Scattered surgical clips in the abdomen. Upper abdominal lymph nodes are not enlarged by CT size criteria. Musculoskeletal: Degenerative changes in the spine. No worrisome lytic or sclerotic lesions. IMPRESSION: 1. Favor absence of interstitial lung disease based on prone imaging. 2. Bibasilar scarring and mild bronchiectasis. 3. Marginal irregularity the liver is indicative of cirrhosis. 4. Aortic atherosclerosis (ICD10-I70.0). Coronary artery calcification. 5.  Emphysema (ICD10-J43.9). 6. Enlarged pulmonic trunk indicative pulmonary arterial hypertension. Electronically Signed   By: Lorin Picket M.D.   On: 01/31/2022 11:53         OV 08/19/2022  Subjective:  Patient ID: Patty [name], female , DOB: 1951/12/06 , age 12 y.o. , MRN: [id number] , ADDRESS: [place]  [phone] PCP [name], MD Patient Care Team: [name], MD as PCP - General (Family Medicine) Josue Hector, MD as PCP - Cardiology (Cardiology) Pyrtle, Lajuan Lines, MD as Consulting Physician (Gastroenterology) [facility], Melanie Crazier, MD as Consulting Physician (Endocrinology) Brand Males, MD as Consulting Physician (Pulmonary Disease)  This Provider for this visit: Treatment Team:  Attending Provider: Brand Males, MD  Type of visit: Video Circumstance: patietn preferene Identification of patient Patty [name] with 21-Aug-1951 and MRN [id number] - 2 person identifier Risks: Risks, benefits, limitations of telephone visit explained. Patient understood and verbalized agreement to proceed Anyone else on call: jsut patient Patient location: her home This provider location: [address]; [place]; [place] [address]. [facility]. [phone]   Chronic hypoxemic respiratory failure due to all of the below ILD (interstitial lung disease) ([place]) - 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 - diagnosed Sept 2022 (in Feb a2023 and April 2023 they are saying no ILD and she feels is because crohns is under remission, tyvaso definitely since sep 2022, and resolution nearbyhome sewage exposure may 2022 - sept 2022) -Non-IPF likely - such as NSIP -  Decline PF March  2020 through February 2022  but stable October 2020 through February 2022  -> declined sept 2022 PFT -Stable on CT scan of the chest 2018 through July 2021  -You are using room air at rest with 3-5 L with exertion -pulse oxygen for the last few months - started esbriet  oct/nov 2022 - stopped GI intolerance 11/29/21    WHO group 3 pulmonary arterial hypertension ([place]) with elevaed PCWP to 20 on Dec 2021 - started tyvaso July 2022 -> stopped sept 2022 of concern PFT declined could ave been due to tyvaso -> but restarted later in sept 2022   WHO group 3 pulmonary arterial hypertension  ([place]) with elevaed PCWP to 20 on Dec 2021 - -Continue CPAP at ngith with 2L [place] at   [place]  08/19/2022 -  Followup above via video    HPI Patty [name] 71 y.o. -overall stable but maybe some more tired than usual. Goes to gym and works out treadmill (!5 min), elliptical 2-3h/week. Walks mostly in stores where she does better. Trying to lose weight- have not lose weight . She has not tried medications for weight loss yet but worried in the settling of Crohns she is worried about side effect. STill on Tyvaso - 9 puff 4 times per day. Did  echo yesterday - gr1 ddx and very small pericardia effusion  per rreport. Had repeat CT April 20223 -> they are saying no ILD again. But she is on tyvaso and helping her dyspnea. She feels tyvaso since 2022 and church controlling sewage overflows in [place] being in remission has helped   RE sewage exposure  -is the nearby church behind her house and there ws intermittent exposure. She feels sewage exposure made her worse. She has an attorney  Using o2 6L [place] with exertion. At rest  Today room air at rest - 96% and HR 77%   CT chest HRCT- Apirl 2023  IMPRESSION: 1. Mild, bland appearing bandlike scarring of the bilateral lung bases, unchanged compared to recent prior examination. No evidence of fibrotic interstitial lung disease. 2. Coronary artery disease. 3. Enlargement of the main pulmonary artery, as can be seen in pulmonary hypertension. 4. Coarse, nodular contour of the partially imaged liver in the upper abdomen, suggesting cirrhosis.   Aortic Atherosclerosis (ICD10-I70.0).     Electronically Signed   By: Delanna Ahmadi M.D.   On: 03/31/2022 10:16    SYMPTOM SCALE - ILD 03/12/2019  10/07/2019 30# wt gain  06/23/2020 250# 09/07/2021 266# - tyvaso since July 2022 12/07/2021 272# 02/01/2022 tyvaso slightly so much better  O2 use RA ra   2-6L 2-6L  Shortness of Breath 0 -> 5 scale with 5  being worst (score 6 If unable to do)   0 0   At rest 0 0 0 0 with o2 0 0  Simple tasks - showers, clothes change, eating, shaving 0 0  0 0 1  Household (dishes, doing bed, laundry) 0 0 0 0 4 1  Shopping 0 0 0 0 1 0  Walking at own pace 0 4.5 4._0 Walking up Stairs 1-_1 Total (40 - 48) Dyspnea Score 2 9.5 9.5 6 al with o2 14 6  How bad is your cough? 0 0 0 0 0 At the time of onset of0  How bad is your fatigue 0 1 0 _2 nausea   0 0  0 0  vomit   0 0 0 0  diarrhe   0 _0 axniety   _1 depression   0 1 1 0        Simple office walk 185 feet x  3 laps goal with forehead probe 03/12/2019  10/07/2019  06/23/2020  09/07/2021   O2 used Room air Room air Room air ra  Number laps completed 3 2nd lap 1 lap 1 lap  Comments about pace slow slow slow   Resting Pulse Ox/HR 100% and 85/min 96% and 72/min 97% and 77 97% and 76  Final Pulse Ox/HR 88% and 135/min 86% and 105/min 87% and 112/min 83 and 112/min  Desaturated </= 88% yes     Desaturated <= 3% points Yes, 12     Got Tachycardic >/= 90/min yes     Symptoms at end of test Mild dyspnea  Corrected with 5L [place]. Even with 4L [place] downt to  87% Corrected with 6LNC at 94$ for antoher 2lpas  Miscellaneous comments x  ? worse    CT Chest data  ECHOCARDIOGRAM COMPLETE  Result Date: 08/18/2022    ECHOCARDIOGRAM REPORT   Patient Name:   [name] Date of Exam: 08/18/2022 Medical Rec #:  [id number]           Height:       64.0 in Accession #:    [id number]          Weight:       269.0 lb Date of Birth:  [birth year]            BSA:          2.219 m Patient Age:    26 years            BP:           132/76 mmHg Patient Gender: F                   HR:           82 bpm. Exam Location:  [facility] Procedure: 2D Echo, Cardiac Doppler, Color Doppler and Intracardiac            Opacification Agent Indications:    I27.20 Pulmonary Hypertension  History:        Patient has prior history of Echocardiogram examinations, most                  recent 07/17/2020. CHF, Signs/Symptoms:Dyspnea; Risk                 Factors:Dyslipidemia and Sleep Apnea. Prediabetes. Morbid                 obesity. Interstitial lung disease.  Sonographer:    Diamond Nickel RCS Referring Phys: [place]  1. Left ventricular ejection fraction, by estimation, is 60 to 65%. The left ventricle has normal function. The left ventricle has no regional wall motion abnormalities. There is mild left ventricular hypertrophy. Left ventricular diastolic parameters are consistent with Grade I diastolic dysfunction (impaired relaxation).  2. Right ventricular systolic function is normal. The right ventricular size is normal. Tricuspid regurgitation signal is inadequate for assessing PA pressure.  3. The mitral valve is normal in structure. No evidence of mitral valve regurgitation. No evidence of mitral stenosis.  4. The aortic valve was not well visualized. Aortic valve regurgitation is not visualized. No aortic stenosis is present. FINDINGS  Left  Ventricle: Left ventricular ejection fraction, by estimation, is 60 to 65%. The left ventricle has normal function. The left ventricle has no regional wall motion abnormalities. The left ventricular internal cavity size was normal in size. There is  mild left ventricular hypertrophy. Left ventricular diastolic parameters are consistent with Grade I diastolic dysfunction (impaired relaxation). Right Ventricle: The right ventricular size is normal. No increase in right ventricular wall thickness. Right ventricular systolic function is normal. Tricuspid regurgitation signal is inadequate for assessing PA pressure. Left Atrium: Left atrial size was normal in size. Right Atrium: Right atrial size was normal in size. Pericardium: Trivial pericardial effusion is present. Presence of epicardial fat layer. Mitral Valve: The mitral valve is normal in structure. No evidence of mitral valve regurgitation. No evidence of mitral  valve stenosis. Tricuspid Valve: The tricuspid valve is normal in structure. Tricuspid valve regurgitation is trivial. Aortic Valve: The aortic valve was not well visualized. Aortic valve regurgitation is not visualized. No aortic stenosis is present. Pulmonic Valve: The pulmonic valve was not well visualized. Pulmonic valve regurgitation is not visualized. Aorta: The aortic root and ascending aorta are structurally normal, with no evidence of dilitation. IAS/Shunts: The interatrial septum was not well visualized.  LEFT VENTRICLE PLAX 2D LVIDd:         5.20 cm   Diastology LVIDs:         3.00 cm   LV e' medial:    6.96 cm/s LV PW:         1.40 cm   LV E/e' medial:  7.5 LV IVS:        1.10 cm   LV e' lateral:   6.53 cm/s LVOT diam:     2.00 cm   LV E/e' lateral: 8.0 LV SV:         78 LV SV Index:   35 LVOT Area:     3.14 cm  RIGHT VENTRICLE RV Basal diam:  3.80 cm RV S prime:     9.46 cm/s TAPSE (M-mode): 1.6 cm LEFT ATRIUM             Index        RIGHT ATRIUM           Index LA diam:        3.90 cm 1.76 cm/m   RA Area:     13.30 cm LA Vol (A2C):   43.4 ml 19.56 ml/m  RA Volume:   30.30 ml  13.65 ml/m LA Vol (A4C):   52.3 ml 23.57 ml/m LA Biplane Vol: 48.3 ml 21.77 ml/m  AORTIC VALVE LVOT Vmax:   115.00 cm/s LVOT Vmean:  71.800 cm/s LVOT VTI:    0.247 m  AORTA Ao Root diam: 3.20 cm Ao Asc diam:  3.00 cm MITRAL VALVE MV Area (PHT): 3.08 cm    SHUNTS MV Decel Time: 246 msec    Systemic VTI:  0.25 m MV E velocity: 52.40 cm/s  Systemic Diam: 2.00 cm MV A velocity: 80.20 cm/s MV E/A ratio:  0.65 Oswaldo Milian MD Electronically signed by Oswaldo Milian MD Signature Date/Time: 08/18/2022/5:12:57 PM    Final       PFT     Latest Ref Rng & Units 12/06/2021   12:57 PM 09/07/2021   12:54 PM 02/22/2021   11:52 AM 06/09/2020   11:25 AM 10/01/2019    3:58 PM 03/12/2019    2:47 PM 08/28/2018   11:22 AM  PFT Results  FVC-Pre L 2.32  2.28  2.38  2.42  2.38  2.73  2.80   FVC-Predicted Pre % 80  79  81   82  81  91  94   Pre FEV1/FVC % % 60  62  63  63  69  67  70   FEV1-Pre L 1.38  1.41  1.50  1.53  1.65  1.83  1.96   FEV1-Predicted Pre % 63  65  68  68  74  80  86   DLCO uncorrected ml/min/mmHg 11.82  11.94  15.05  13.48  14.39  13.77  14.53   DLCO UNC% % 62  63  79  70  75  72  62   DLCO corrected ml/min/mmHg 11.45  11.94  27.44  12.89      DLCO COR %Predicted % 60  63  144  67      DLVA Predicted % 66  70  152  70  75  72  65        has a past medical history of Abnormal nuclear stress test, Anxiety, Aortic atherosclerosis (HCC), Arthritis, Chronic diastolic CHF (congestive heart failure) (HCC), Coronary artery calcification seen on CT scan, Crohn disease (HCC), DVT (deep venous thrombosis) (HCC), Dyspnea, Grade I diastolic dysfunction, Hyperlipidemia, ILD (interstitial lung disease) (HCC), OSA on CPAP, Pernicious anemia, Polycythemia vera ([place]), Pulmonary embolism ([place]) (2010), Pulmonary hypertension ([place]), and Sleep disorder (01/10/2020).   reports that she quit smoking about 13 years ago. Her smoking use included cigarettes. She has a 45.00 pack-year smoking history. She has never used smokeless tobacco.  Past Surgical History:  Procedure Laterality Date    RIGHT BREAST LUMPECTOMY WITH RADIOACTIVE SEED LOCALIZATION (Right Breast)  02/04/2021   BIOPSY  09/30/2019   Procedure: BIOPSY;  Surgeon: Jerene Bears, MD;  Location: [facility];  Service: Gastroenterology;;   BREAST LUMPECTOMY WITH RADIOACTIVE SEED LOCALIZATION Right 02/04/2021   Procedure: RIGHT BREAST LUMPECTOMY WITH RADIOACTIVE SEED LOCALIZATION;  Surgeon: Coralie Keens, MD;  Location: [facility];  Service: General;  Laterality: Right;   CARDIAC CATHETERIZATION     COLON RESECTION  1993   COLONOSCOPY WITH PROPOFOL N/A 09/30/2019   Procedure: COLONOSCOPY WITH PROPOFOL;  Surgeon: Jerene Bears, MD;  Location: [facility];  Service: Gastroenterology;  Laterality: N/A;   HIP ARTHROPLASTY Right    x 2, initial right THA, then  subsequent right THA revision    LEFT HEART CATH AND CORONARY ANGIOGRAPHY N/A 07/28/2017   Procedure: Left Heart Cath and Coronary Angiography;  Surgeon: Burnell Blanks, MD;  Location: [facility];  Service: Cardiovascular;  Laterality: N/A;   POLYPECTOMY  09/30/2019   Procedure: POLYPECTOMY;  Surgeon: Jerene Bears, MD;  Location: [facility];  Service: Gastroenterology;;   RIGHT HEART CATH N/A 12/07/2020   Procedure: RIGHT HEART CATH;  Surgeon: Sherren Mocha, MD;  Location: [place] [facility];  Service: Cardiovascular;  Laterality: N/A;    Allergies  Allergen Reactions   Victoza [Liraglutide] Shortness Of Breath   Metformin And Related Diarrhea    Increased moodiness   Onion Other (See Comments)    sereve diarrhea, stomach pains (Crohn's flares)   Lasix [Furosemide] Itching    Hypersensitivity - pt can take as needed    Pirfenidone Other (See Comments)    GI side effects   Potassium-Containing Compounds Other (See Comments)    Due to chron's, difficult passing though kidneys not allowing absorption in the body    Prednisone Other (See Comments)  Altered mood   Sulfa Drugs Cross Reactors Other (See Comments)    "feels like bugs crawling on me"    Immunization History  Administered Date(s) Administered   Fluad Quad(high Dose 65+) 10/07/2019, 09/09/2020, 11/27/2021   Influenza, High Dose Seasonal PF 08/28/2018   Influenza-Unspecified 11/23/2017   PFIZER(Purple Top)SARS-COV-2 Vaccination 02/16/2020, 03/17/2020, 12/14/2020, 05/14/2021, 10/27/2021   Pfizer Covid-19 Vaccine Bivalent Booster 51yr & up 10/27/2021   Pneumococcal Conjugate-13 02/13/2018   Pneumococcal Polysaccharide-23 03/12/2019    Family History  Problem Relation Age of Onset   Glaucoma Mother    High blood pressure Mother    High Cholesterol Mother    Sleep apnea Mother    Diabetes Mellitus [name] Father    Sleep apnea Father    Anxiety disorder Father    COPD Brother    Heart disease Brother     Non-Hodgkin's lymphoma Brother    Diabetes Mellitus [name] Brother    Glaucoma Brother    Colon cancer Neg Hx    Esophageal cancer Neg Hx    Pancreatic cancer Neg Hx    Stomach cancer Neg Hx    Liver disease Neg Hx      Current Outpatient Medications:    amoxicillin (AMOXIL) 500 MG capsule, Take 2,000 mg by mouth See admin instructions. Before dental procedures, Disp: , Rfl:    aspirin EC 81 MG tablet, Take 81 mg by mouth daily., Disp: , Rfl:    Calcium Carbonate-Vit D-Min (CALCIUM 1200) 1200-1000 MG-UNIT CHEW, Chew 1 tablet by mouth daily., Disp: , Rfl:    CANNABIDIOL PO, Take 1 Dose by mouth 2 (two) times daily as needed (anxiety/sleep.). CBD Oil, Disp: , Rfl:    cetirizine (ZYRTEC) 10 MG tablet, Take 10 mg by mouth daily as needed (sinus/allergies.)., Disp: , Rfl:    cetirizine-pseudoephedrine (ZYRTEC-D) 5-120 MG tablet, Take 1 tablet by mouth daily as needed (sinus headaches.)., Disp: , Rfl:    clonazePAM (KLONOPIN) 0.5 MG tablet, Take 0.5-1 tablets (0.25-0.5 mg total) by mouth daily as needed for anxiety., Disp: 90 tablet, Rfl: 1   cyanocobalamin (,VITAMIN B-12,) 1000 MCG/ML injection, INJECT 1 ML (1,000 MCG TOTAL) INTO THE MUSCLE EVERY 30 (THIRTY) DAYS. INJECT 1 ML (1,000 MCG) INTO THE MUSCLE EVERY 30 DAYS, Disp: 12 mL, Rfl: 0   FLUoxetine (PROZAC) 20 MG capsule, Take 1 capsule (20 mg total) by mouth daily., Disp: 90 capsule, Rfl: 3   fluticasone (FLONASE) 50 MCG/ACT nasal spray, Place 1-2 sprays into both nostrils daily as needed for allergies or rhinitis., Disp: , Rfl:    furosemide (LASIX) 20 MG tablet, Take 1 tablet (20 mg total) by mouth as needed for fluid., Disp: 90 tablet, Rfl: 0   Glycerin-Polysorbate 80 (REFRESH DRY EYE THERAPY OP), Place 1 drop into both eyes daily., Disp: , Rfl:    hydrocortisone (PROCTOZONE-HC) 2.5 % rectal cream, Place 1 application rectally 2 (two) times daily as needed (Crohn's flare)., Disp: 28 g, Rfl: 2   ibuprofen (ADVIL) 200 MG tablet, Take 400-800  mg by mouth every 8 (eight) hours as needed (pain.)., Disp: , Rfl:    mesalamine (PENTASA) 250 MG CR capsule, Take 250-500 mg by mouth 4 (four) times daily as needed (crohn's disease flare ups)., Disp: , Rfl:    Multiple Vitamins-Minerals (ICAPS AREDS 2 PO), Take 1 tablet by mouth daily., Disp: , Rfl:    prednisoLONE acetate (PRED FORTE) 1 % ophthalmic suspension, Place 1 drop into both eyes 2 (two) times daily as needed (Uveitis)., Disp: ,  Rfl:    Probiotic Product (PROBIOTIC PO), Take 1 capsule by mouth 2 (two) times a week. As needed, Disp: , Rfl:    traMADol (ULTRAM) 50 MG tablet, Take 1 tablet (50 mg total) by mouth every 6 (six) hours as needed., Disp: 20 tablet, Rfl: 0   traZODone (DESYREL) 50 MG tablet, Take 0.5-1 tablets (25-50 mg total) by mouth at bedtime as needed for sleep., Disp: 30 tablet, Rfl: 3   Treprostinil (TYVASO REFILL) 0.6 MG/ML SOLN, Inhale 54 mcg into the lungs in the morning, at noon, in the evening, and at bedtime., Disp: 87 mL, Rfl: 11      Objective:   There were no vitals filed for this visit.  Estimated body mass index is 46.17 kg/m as calculated from the following:   Height as of 08/01/22: _0  (1.626 m).   Weight as of 08/01/22: 269 lb (122 kg).  _1 @  There were no vitals filed for this visit.   Physical Exam    General: No distress. Looks well on video Neuro: Alert and Oriented x 3. GCS 15. Speech normal Psych: Pleasant HEENT: Normal upper airway.       Assessment:       ICD-10-CM   1. Chronic hypoxemic respiratory failure (HCC)  J96.11     2. WHO group 3 pulmonary arterial hypertension (HCC)  I27.23     3. OSA on CPAP  G47.33    Z99.89          Plan:     Patient Instructions  Chronic hypoxemic respiratory failure due to all of the below ILD (interstitial lung disease) ([place]) - progressive phenotype diagnosed  WHO group 3 pulmonary arterial hypertension ([place]) with elevaed PCWP to 20 on Dec 2021   L:ung function declined  17% in 3 years on FVC, fev1 and dlco. So finding it a bit odd that radiologist in feb 2023 and April 2023 feels you do not have ILD but have what is called ILA (interst lung abnormaliteis)   Glad rechallenge tyvaso is working well - currently at 4x/day - using 9 times each time and exercise tolerance better  Glad no more sewage exposure - I strongly believe this very likely played a role in deterioration last year  Seems clinically stable right now   Plan - try reaching out to [employer] attorney - continue tyvaso at curent dose - do spirometry and dlco in 3 months to see current status --Continue oxygen 3-6 L nasal cannula pulsed with exertion and at night 2 L nasal cannula and room air at rest -Continue exercise program  -However continue to keep her pulse ox at greater than or equal to 88% using a pulse oximeter --Make attempts to lose weight - talk to your PCP [name], MD abut weight loss drugs - will discuss I our MDD case conference  OSA on CPAP  Stable  Plan  -Continue CPAP at ngith with 2L [place] at night  per the advice of his sleep doctor - at some point can check o2 level at night   Associated emphysema on CT  Plan  - supporitive care   Follow-up  - 6 months s face to face with Dr Chase Caller but afte PFT; 30 min visit   -   (Level 04: Estb 30-39 min  visit type: video virtual visit visit spent in total care time and counseling or/and coordination of care by this undersigned MD - Dr Brand Males. This includes one or more of  the following on this same day 08/19/2022: pre-charting, chart review, note writing, documentation discussion of test results, diagnostic or treatment recommendations, prognosis, risks and benefits of management options, instructions, education, compliance or risk-factor reduction. It excludes time spent by the [place] or office staff in the care of the patient . Actual time is 30 min)   SIGNATURE    Dr. Brand Males, M.D., F.C.C.P,   Pulmonary and Critical Care Medicine Staff Physician, [place] Director - Interstitial Lung Disease  Program  Pulmonary [place] at [place], [place], [address]  Pager: [phone], If no answer or between  15:00h - 7:00h: call [phone] Telephone: [phone]  1:29 PM 08/19/2022

## 2022-08-19 ENCOUNTER — Telehealth (INDEPENDENT_AMBULATORY_CARE_PROVIDER_SITE_OTHER): Payer: Medicare Other | Admitting: Internal Medicine

## 2022-08-19 DIAGNOSIS — Z9989 Dependence on other enabling machines and devices: Secondary | ICD-10-CM

## 2022-08-19 DIAGNOSIS — I2723 Pulmonary hypertension due to lung diseases and hypoxia: Secondary | ICD-10-CM | POA: Diagnosis not present

## 2022-08-19 DIAGNOSIS — G4733 Obstructive sleep apnea (adult) (pediatric): Secondary | ICD-10-CM

## 2022-08-19 DIAGNOSIS — J9611 Chronic respiratory failure with hypoxia: Secondary | ICD-10-CM

## 2022-08-21 ENCOUNTER — Other Ambulatory Visit: Payer: Self-pay | Admitting: Family Medicine

## 2022-08-22 NOTE — Telephone Encounter (Signed)
Pt has not been here since 2/23. Ok to refill for 90 days

## 2022-09-13 ENCOUNTER — Telehealth: Payer: Self-pay | Admitting: *Deleted

## 2022-09-13 NOTE — Patient Outreach (Signed)
  Care Coordination   09/13/2022 Name: Patty [name] MRN: [id number] DOB: 1951/10/11   Care Coordination Outreach Attempts:  An unsuccessful telephone outreach was attempted today to offer the patient information about available care coordination services as a benefit of their health plan.   Follow Up Plan:  Additional outreach attempts will be made to offer the patient care coordination information and services.   Encounter Outcome:  No Answer  Care Coordination Interventions Activated:  No   Care Coordination Interventions:  No, not indicated    Raina Mina, RN Care Management Coordinator [place] Office [phone]

## 2022-09-14 NOTE — Progress Notes (Signed)
09/15/22- 71/ [employer] oF former smoker for sleep evaluation with concern of OSA, Insomnia Medical prolem list includes ILD(Dr Ramaswamy), PAH WHO Group 3, Chronic Respiratory Failure with Hypoxia, Aortic Atherosclerosis, dCHF, CAD, hx DVT/PE, Allergic Rhinitis, Crohn's,  O2 3L Has been referred to [facility] and to Palliative Care HST 06/01/17- AHI 33.8/ hr, desaturation to 69%, body weight 240 lbs   CPAP to 8 CPAP auto 5-15/  [employer] Download compliance 97%, AHI 1.8/ hr Epworth score-1 Body weight today-269 lbs Covid vax-5 Phizer Flu vax-pending next week She started CPAP with original diagnosis in 2018.  Now having persistent problems falling asleep and maintaining sleep.  Bedtime between 11 PM and 3 AM.  Currently taking melatonin plus clonazepam and add an OTC sleep med.  Has tried trazodone. Waking 2 or 3 times at night and staying awake for an hour or more at a time.  Depending on when she has sustained sleep, she will wake between 9 AM and 12 noon. CPAP machine is at least 72 years old and she is getting a message on the screen that the motor life has been exceeded.  Life has been better off with CPAP.  She does bleed in oxygen 3 L for sleep, 4 L when awake, 6 L with exertion.  Prior to Admission medications   Medication Sig Start Date End Date Taking? Authorizing Provider  amoxicillin (AMOXIL) 500 MG capsule Take 2,000 mg by mouth See admin instructions. Before dental procedures 08/18/20  Yes [provider]  aspirin EC 81 MG tablet Take 81 mg by mouth daily.   Yes [provider]  Calcium Carbonate-Vit D-Min (CALCIUM 1200) 1200-1000 MG-UNIT CHEW Chew 1 tablet by mouth daily. 08/23/20  Yes [provider]  CANNABIDIOL PO Take 1 Dose by mouth 2 (two) times daily as needed (anxiety/sleep.). CBD Oil   Yes [provider]  cetirizine (ZYRTEC) 10 MG tablet Take 10 mg by mouth daily as needed (sinus/allergies.).   Yes [provider]   cetirizine-pseudoephedrine (ZYRTEC-D) 5-120 MG tablet Take 1 tablet by mouth daily as needed (sinus headaches.).   Yes [provider]  clonazePAM (KLONOPIN) 0.5 MG tablet Take 0.5-1 tablets (0.25-0.5 mg total) by mouth daily as needed for anxiety. 01/31/22  Yes [name], MD  cyanocobalamin (,VITAMIN B-12,) 1000 MCG/ML injection INJECT 1 ML (1,000 MCG TOTAL) INTO THE MUSCLE EVERY 30 (THIRTY) DAYS. INJECT 1 ML (1,000 MCG) INTO THE MUSCLE EVERY 30 DAYS 07/15/21  Yes [name], MD  FLUoxetine (PROZAC) 20 MG capsule Take 1 capsule (20 mg total) by mouth daily. 01/31/22  Yes [name], MD  fluticasone [facility]) 50 MCG/ACT nasal spray Place 1-2 sprays into both nostrils daily as needed for allergies or rhinitis.   Yes [provider]  furosemide (LASIX) 20 MG tablet Take 1 tablet (20 mg total) by mouth as needed for fluid. 07/01/22  Yes Josue Hector, MD  Glycerin-Polysorbate 80 (REFRESH DRY EYE THERAPY OP) Place 1 drop into both eyes daily.   Yes [provider]  hydrocortisone (PROCTOZONE-HC) 2.5 % rectal cream Place 1 application rectally 2 (two) times daily as needed (Crohn's flare). 08/02/21  Yes [name], MD  ibuprofen (ADVIL) 200 MG tablet Take 400-800 mg by mouth every 8 (eight) hours as needed (pain.).   Yes [provider]  mesalamine (PENTASA) 250 MG CR capsule Take 250-500 mg by mouth 4 (four) times daily as needed (crohn's disease flare ups).   Yes [provider]  Multiple Vitamins-Minerals (ICAPS AREDS 2 PO) Take 1 tablet by mouth daily. 12/26/16  Yes [provider]  prednisoLONE acetate (PRED FORTE) 1 % ophthalmic suspension Place 1 drop into both eyes 2 (two) times daily as needed (Uveitis).   Yes [provider]  Probiotic Product (PROBIOTIC PO) Take 1 capsule by mouth 2 (two) times a week. As needed   Yes [provider]  traMADol (ULTRAM) 50 MG tablet Take 1 tablet (50 mg total) by mouth every 6  (six) hours as needed. 02/05/21  Yes Coralie Keens, MD  traZODone (DESYREL) 50 MG tablet TAKE 0.5-1 TABLETS BY MOUTH AT BEDTIME AS NEEDED FOR SLEEP. 08/22/22  Yes [name], MD  Treprostinil (TYVASO REFILL) 0.6 MG/ML SOLN Inhale 54 mcg into the lungs in the morning, at noon, in the evening, and at bedtime. 07/01/22  Yes Brand Males, MD   Past Medical History:  Diagnosis Date   Abnormal nuclear stress test    a. 07/2017: cath with no CAD   Anxiety    Aortic atherosclerosis (HCC)    Arthritis    Chronic diastolic CHF (congestive heart failure) (HCC)    Coronary artery calcification seen on CT scan    Crohn disease (HCC)    DVT (deep venous thrombosis) (HCC)    Dyspnea    Grade I diastolic dysfunction    Hyperlipidemia    ILD (interstitial lung disease) (HCC)    OSA on CPAP    CPAP    AND O2   Pernicious anemia    Polycythemia vera ([place])    Pulmonary embolism ([place]) 2010   s/p hip surgery , reports this was never truly confirmed    Pulmonary hypertension ([place])    Sleep disorder 01/10/2020   Uses klonopin for sleep since 2014; sleep walking. And anxiety   Past Surgical History:  Procedure Laterality Date    RIGHT BREAST LUMPECTOMY WITH RADIOACTIVE SEED LOCALIZATION (Right Breast)  02/04/2021   BIOPSY  09/30/2019   Procedure: BIOPSY;  Surgeon: Jerene Bears, MD;  Location: [facility];  Service: Gastroenterology;;   BREAST LUMPECTOMY WITH RADIOACTIVE SEED LOCALIZATION Right 02/04/2021   Procedure: RIGHT BREAST LUMPECTOMY WITH RADIOACTIVE SEED LOCALIZATION;  Surgeon: Coralie Keens, MD;  Location: [facility];  Service: General;  Laterality: Right;   CARDIAC CATHETERIZATION     COLON RESECTION  1993   COLONOSCOPY WITH PROPOFOL N/A 09/30/2019   Procedure: COLONOSCOPY WITH PROPOFOL;  Surgeon: Jerene Bears, MD;  Location: [facility];  Service: Gastroenterology;  Laterality: N/A;   HIP ARTHROPLASTY Right    x 2, initial right THA, then subsequent right THA revision    LEFT  HEART CATH AND CORONARY ANGIOGRAPHY N/A 07/28/2017   Procedure: Left Heart Cath and Coronary Angiography;  Surgeon: Burnell Blanks, MD;  Location: [place];  Service: Cardiovascular;  Laterality: N/A;   POLYPECTOMY  09/30/2019   Procedure: POLYPECTOMY;  Surgeon: Jerene Bears, MD;  Location: [facility];  Service: Gastroenterology;;   RIGHT HEART CATH N/A 12/07/2020   Procedure: RIGHT HEART CATH;  Surgeon: Sherren Mocha, MD;  Location: [facility];  Service: Cardiovascular;  Laterality: N/A;   Family History  Problem Relation Age of Onset   Glaucoma Mother    High blood pressure Mother    High Cholesterol Mother    Sleep apnea Mother    Diabetes Mellitus II Father    Sleep apnea Father    Anxiety disorder Father    COPD Brother  Heart disease Brother    Non-Hodgkin's lymphoma Brother    Diabetes Mellitus II Brother    Glaucoma Brother    Colon cancer Neg Hx    Esophageal cancer Neg Hx    Pancreatic cancer Neg Hx    Stomach cancer Neg Hx    Liver disease Neg Hx    Social History   Socioeconomic History   Marital status: Single    Spouse name: Not on file   Number of children: 0   Years of education: Not on file   Highest education level: Not on file  Occupational History   Occupation: retired  Tobacco Use   Smoking status: Former    Packs/day: 1.00    Years: 45.00    Total pack years: 45.00    Types: Cigarettes    Quit date: 12/26/2008    Years since quitting: 13.7   Smokeless tobacco: Never  Vaping Use   Vaping Use: Never used  Substance and Sexual Activity   Alcohol use: Yes    Comment: occ   Drug use: No   Sexual activity: Not Currently  Other Topics Concern   Not on file  Social History Narrative   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    Social Determinants of Health   Financial Resource Strain: Low Risk  (12/03/2020)   Overall Financial Resource Strain (CARDIA)    Difficulty of  Paying Living Expenses: Not hard at all  Food Insecurity: No Food Insecurity (12/03/2020)   Hunger Vital Sign    Worried About Running Out of Food in the Last Year: Never true    Ran Out of Food in the Last Year: Never true  Transportation Needs: No Transportation Needs (12/03/2020)   PRAPARE - Hydrologist (Medical): No    Lack of Transportation (Non-Medical): No  Physical Activity: Insufficiently Active (12/03/2020)   Exercise Vital Sign    Days of Exercise per Week: 4 days    Minutes of Exercise per Session: 30 min  Stress: Stress Concern Present (12/03/2020)   [place]    Feeling of Stress : To some extent  Social Connections: Socially Isolated (12/03/2020)   Social Connection and Isolation Panel [NHANES]    Frequency of Communication with Friends and Family: Twice a week    Frequency of Social Gatherings with Friends and Family: Never    Attends Religious Services: 1 to 4 times per year    Active Member of [employer] or Organizations: No    Attends Archivist Meetings: Never    Marital Status: Never married  Intimate Partner Violence: Not At Risk (12/03/2020)   Humiliation, Afraid, Rape, and Kick questionnaire    Fear of Current or Ex-Partner: No    Emotionally Abused: No    Physically Abused: No    Sexually Abused: No   ROS-see HPI   + = positive Constitutional:    weight loss, night sweats, fevers, chills, fatigue, lassitude. HEENT:    headaches, difficulty swallowing, tooth/dental problems, sore throat,       sneezing, +itching, ear ache, +nasal congestion, post nasal drip, snoring CV:    chest pain, orthopnea, PND, swelling in lower extremities, anasarca,                dizziness, palpitations Resp:   +shortness of breath with exertion or at rest.  productive cough,   non-productive cough, coughing up of blood.              change in color of mucus.  wheezing.    Skin:    rash or lesions. GI:  No-   heartburn, indigestion, abdominal pain, nausea, vomiting, diarrhea,                 change in bowel habits, loss of appetite GU: dysuria, change in color of urine, no urgency or frequency.   flank pain. MS:   joint pain, stiffness, decreased range of motion, back pain. Neuro-     nothing unusual Psych:  change in mood or affect.  depression or +anxiety.   memory loss.  OBJ- Physical Exam General- Alert, Oriented, Affect-appropriate, Distress- none acute, + obese, O2 3L Skin- rash-none, lesions- none, excoriation- none Lymphadenopathy- none Head- atraumatic   +Xanthelasma            Eyes- Gross vision intact, PERRLA, conjunctivae and secretions clear            Ears- Hearing, canals-normal            Nose- Clear, no-Septal dev, mucus, polyps, erosion, perforation             Throat- Mallampati IV , mucosa clear , drainage- none, tonsils+, teeth+ Neck- flexible , trachea midline, no stridor , thyroid nl, carotid no bruit Chest - symmetrical excursion , unlabored           Heart/CV- RRR , no murmur , no gallop  , no rub, nl s1 s2                           - JVD- none , edema- none, stasis changes- none, varices- none           Lung- +few crackles, wheeze- none, cough- none , dullness-none, rub- none           Chest wall-  Abd-  Br/ Gen/ Rectal- Not done, not indicated Extrem- cyanosis- none, clubbing, none, atrophy- none, strength- nl Neuro- grossly intact to observation

## 2022-09-15 ENCOUNTER — Ambulatory Visit (INDEPENDENT_AMBULATORY_CARE_PROVIDER_SITE_OTHER): Payer: Medicare Other | Admitting: Internal Medicine

## 2022-09-15 ENCOUNTER — Encounter: Payer: Self-pay | Admitting: Internal Medicine

## 2022-09-15 VITALS — BP 132/72 | HR 119 | Temp 98.5°F | Ht 64.0 in | Wt 269.0 lb

## 2022-09-15 DIAGNOSIS — J9611 Chronic respiratory failure with hypoxia: Secondary | ICD-10-CM

## 2022-09-15 DIAGNOSIS — G4733 Obstructive sleep apnea (adult) (pediatric): Secondary | ICD-10-CM

## 2022-09-15 DIAGNOSIS — Z9989 Dependence on other enabling machines and devices: Secondary | ICD-10-CM

## 2022-09-15 NOTE — Patient Instructions (Signed)
Order- DME [employer]- please replace old CPAP machine auto 5-15, mask of choice, humidifier, supplies, [place] card.   Continue to bleed in O2 at 2l through CPAP  Please call if we can help

## 2022-09-16 ENCOUNTER — Ambulatory Visit: Payer: Medicare Other | Admitting: Adult Health

## 2022-09-16 NOTE — Assessment & Plan Note (Signed)
Remains dependent on supplemental O2. Long-term general pulmonary f/y with Dr Chase Caller.

## 2022-09-16 NOTE — Assessment & Plan Note (Signed)
Severe OSA complicated by hypoxic respiratory failure. Benefits from CPAP. Machine needs replacement Plan- replace old machine, continue auto 5-15 with O2 3L

## 2022-09-19 ENCOUNTER — Encounter: Payer: Self-pay | Admitting: *Deleted

## 2022-09-19 DIAGNOSIS — Z23 Encounter for immunization: Secondary | ICD-10-CM | POA: Diagnosis not present

## 2022-09-22 ENCOUNTER — Ambulatory Visit (INDEPENDENT_AMBULATORY_CARE_PROVIDER_SITE_OTHER): Payer: Medicare Other | Admitting: Internal Medicine

## 2022-09-22 ENCOUNTER — Encounter: Payer: Self-pay | Admitting: Internal Medicine

## 2022-09-22 VITALS — BP 126/72 | HR 82 | Temp 98.8°F | Ht 64.0 in | Wt 272.0 lb

## 2022-09-22 DIAGNOSIS — I2723 Pulmonary hypertension due to lung diseases and hypoxia: Secondary | ICD-10-CM

## 2022-09-22 DIAGNOSIS — J849 Interstitial pulmonary disease, unspecified: Secondary | ICD-10-CM

## 2022-09-22 DIAGNOSIS — J9611 Chronic respiratory failure with hypoxia: Secondary | ICD-10-CM | POA: Diagnosis not present

## 2022-09-22 LAB — PULMONARY FUNCTION TEST
DL/VA % pred: 66 %
DL/VA: 2.74 ml/min/mmHg/L
DLCO cor % pred: 56 %
DLCO cor: 10.61 ml/min/mmHg
DLCO unc % pred: 57 %
DLCO unc: 10.92 ml/min/mmHg
FEF 25-75 Pre: 0.7 L/sec
FEF2575-%Pred-Pre: 39 %
FEV1-%Pred-Pre: 61 %
FEV1-Pre: 1.3 L
FEV1FVC-%Pred-Pre: 79 %
FEV6-%Pred-Pre: 79 %
FEV6-Pre: 2.15 L
FEV6FVC-%Pred-Pre: 104 %
FVC-%Pred-Pre: 76 %
FVC-Pre: 2.16 L
Pre FEV1/FVC ratio: 60 %
Pre FEV6/FVC Ratio: 99 %

## 2022-09-22 MED ORDER — TRELEGY ELLIPTA 100-62.5-25 MCG/ACT IN AEPB: 1.0000 | INHALATION_SPRAY | Freq: Every day | RESPIRATORY_TRACT | 0 refills | Status: DC

## 2022-09-22 NOTE — Progress Notes (Signed)
Spirometry/DLCO performed today.

## 2022-09-22 NOTE — Progress Notes (Signed)
_0  ID: Patty [name], female    DOB: 05/20/1951, 71 y.o.   MRN: [id number]  Chief Complaint  Patient presents with   Follow-up    dyspnea     Referring provider: No ref. provider found  HPI: 71 year old female former smoker seen for pulmonary consulokay guarded because then maybe they can meet him right now for the registryt 05/09/2017 for progressive dyspnea since 2004.[name] Patient has polycythemia vera followed by hematology and [place] She has Crohn's disease    IOV  05/09/2017  Chief Complaint  Patient presents with   Pulmonary Consult    Pt referred for SOB with activity x years. Pt states over the last year the DOE has worsened. Pt denies cough and CP/tightness and f/c/s.     71 year old obese female. In 2004 Started noticing insidious onset of shortness of breath. In 2005 more [place] from [place] and probably gain over 60 pounds of weight. She has extensive workup at that time according to her history. She was then diagnosed with polycythemia by Dr. [name] at [facility]. She does not recollect any phlebotomies for this. However in the last 4-5 years she's had worsening shortness of breath. Noticeable with exertion particularly in the gym and doing standing exercises but not so much with sitting exercises. Relieved by rest. When she does some yard work she notices some associated wheezing as well.  Walking desaturation test in the office she did desaturate to 88% and did get tachycardic. She says that a chest x-ray done by primary care physicians interstitial pulmonary fibrosis. I personally visualized the chest x-ray done 04/03/2017: There is some interstitial markings but it is obscured by her obesity. I'm not so certain that is definite ILD. There is no lab work in the record. There is no PFT a CT scan available for visualization     05/29/2017 Follow up : Dyspnea  Patient returns for a two-week follow-up. Patient was seen for pulmonary consult  05/09/2017 for progressive dyspnea since 2004. Patient was set up for a high resolution CT chest that showed a very mild basilar predominant subpleural reticulation and bronchiectasis which could be due to nonspecific interstitial pneumonitis.. CT did have incidental findings of enlarged pulmonary arteries and aortic and coronary calcification. Patient had pulmonary function test on May 23 that showed an FEV1 at 77%, ratio 73, FVC 81, no significant bronchodilator response, total lung capacity 100%, DLCO 52%. Overnight oximetry test did show significant desaturations. We discussed beginning oxygen at bedtime.  Patient denies significant cough. Says that she get short of breath with walking. Has daytime fatigue and low energy..  Interstitial lung disease patient questionnaire was completed as follows Patient has no previous use of Macrodantin, amiodarone, methotrexate. She has been treated with prednisone for her Crohn's in the past. Patient has had extensive travel with brief visits to multiple state and country's.. [place]. Did live in a apartment that she did notice mold for 4 years. She does have Crohn's disease and was on Mesamaline for many years. Patient says she has had a humidifier and hot tub and previous house. She has no indoor birds. She does have a dog. Patient says she was told that she had a blood clot in the past but was never found.   OV 08/15/2017  Chief Complaint  Patient presents with   Follow-up    Pt still gets occ. SOB. Other than that, pt states that she has been doing  good. Denies any cough or CP.     Follow-up multifactorial dyspnea with interstitial lung disease concern.   Regarding interstitial lung disease concern: Mid May 2018 she had high resolution CT chest that showed interstitial lung disease very mild but unclear if possible UIP pattern. Autoimmune test was negative. Isolated reduction in diffusion capacity 50%. [employer] of chest  physicians questionnaire shows previous Crohn's disease and also mold exposure previously. Overall she feels stable  Regarding dyspnea: She underwent echocardiogram in June 2018 showed grade 1 diastolic dysfunction. Had abnormal cardiac stress test July 2018 followed by cardiac cath early August 2018 that showed normal coronary angiogram but elevated left ventricular end-diastolic pressure. Dietary therapy has been recommended but she is unaware of these results.  Overall she is here to discuss these results.  IMPRESSION: 1. Suspect very mild basilar predominant subpleural reticulation and bronchiolectasis, which may be due to nonspecific interstitial pneumonitis. 2. Aortic atherosclerosis (ICD10-170.0). Coronary artery calcification. 3. Enlarged pulmonary arteries, indicative of pulmonary arterial hypertension.     Electronically Signed   By: Lorin Picket M.D.   On: 05/13/2017 07:33    OV 10/17/2017  Chief Complaint  Patient presents with   Follow-up    Pt states that she has had good days and bad days since last visit. States that when she was in [place] for a month, breathing was better; still became SOB but not as bad as in [place]. Pt's SOB is mainly on exertion. Denies any cough or CP.    Follow-up dyspnea that is multifactorial due to obesity, physical deconditioning and diastolic dysfunction Follow-up mild interstitial lung disease not otherwise specified  Last visit I started her on Lasix. I was only supposed to see herin 6 months or so. However she's had problems tolerating Lasix and potassium. It fluctuates between palpitations and hypertension. She feels the palpitations might be related to potassium depletion after taking Lasix. But she also tells me that the Lasix does help her dyspnea. At this point in time her condition is to see cardiologist. Off note she did send the email message asking these questions on 09/28/2017 made a reply that she tells me that she never got  the reply.   OV 02/13/2018  Chief Complaint  Patient presents with   Follow-up    PFT done 02/05/18.  Pt states she has been doing good. Was sick in january but states she is doing better.  DME: [facility] 4L pulse    Follow-up dyspnea that is multifactorial due to obesity, physical deconditioning and diastolic dysfunction Follow-up mild interstitial lung disease not otherwise specified v concern   71 year old female with concern for interstitial lung disease.  Since her last visit she continues to do well.  She is attending pulmonary rehabilitation.  She feels better and less short of breath.  She uses oxygen with exertion at rehab saying she needs it.  She has upcoming travel in summer to [place] and wanted some flight information oxygen form filled out.  She is wondering if mesalamine that she took several years ago was the cause of interstitial lung disease or her Crohn's disease.  But overall she is stable.  Pulmonary function test shows stability and FVC since last 1 year with some improvement in DLCO.  High-resolution CT scan of the chest interpreted by thoracic radiology report suspected ILD.   OV 08/28/2018  Chief Complaint  Patient presents with   Follow-up    PFT performed today.  Pt states she has had both good and  bad days since last visit. Pt denies any complaints of cough, SOB, or CP but states she is having some problems with congestion since she has been back from vacation. Pt has been having problems with getting O2 supplies taken care of with DME.    Follow-up dyspnea that is multifactorial due to obesity, physical deconditioning and diastolic dysfunction Follow-up concern for  mild interstitial lung disease not otherwise specified  Patty [name] - Presents for follow-up for the above issues. Her dyspnea significantly better following a 30 pound weight loss and continued rehabilitation. She uses oxygen with rehabilitation. Today walking desaturation test 185 feet 3 laps on room  air: She dropped to 87% on the second lap. But she is feeling better. She had spirometry today that shows improvement in FVC concomitant with weight loss but no change in diffusion capacity that reflects the presence of ongoing possible ILD.She continues to lose more weight she believes that she could lose another 30 pounds before the next visit.     OV 03/12/2019  Subjective:  Patient ID: Patty [name], female , DOB: 03-Dec-1951 , age 71 y.o. , MRN: [id number] , ADDRESS: [place] [id number]   03/12/2019 -   Chief Complaint  Patient presents with   Follow-up    PFT performed today.  Pt states she has been doing good since last visit. States she still becomes SOB with exertion, and has phlegm in the mornings, postnasal drainage which has used flonase to help. Pt also uses POC as needed.    Follow-up dyspnea that is multifactorial due to obesity, physical deconditioning and diastolic dysfunction Follow-up  mild interstitial lung disease not otherwise specifieds -with stability 2018 through 2020 [non-IPF pattern]; on observation   HPI Khira Cudmore 71 y.o. -presents for follow-up.  She is attending pulmonary rehabilitation and using oxygen with exertion.  Overall she feels stable.  Subjective symptom parameters and objective parameters are documented below.  She had high-resolution CT chest that showed shows that she has ILD.  At this point we can be confident that she has mild ILD but it is stable based on symptoms walking test and pulmonary function test.  The previous autoimmune test was negative.      OV 10/07/2019  Subjective:  Patient ID: Patty [name], female , DOB: Apr 29, 1951 , age 68 y.o. , MRN: [id number] , ADDRESS: [place] [address]   10/07/2019 -   Chief Complaint  Patient presents with   Follow-up     Follow-up dyspnea that is multifactorial due to obesity, physical deconditioning and diastolic dysfunction Follow-up  mild  interstitial lung disease not otherwise specifieds -with stability 2018 through 2020 [non-IPF pattern]; on observation Follow-up mild pulmonary emphysema present on high-resolution CT chest  HPI Hamdi Vari 71 y.o. -presents for follow-up.  Prior visit was pre-pandemic.  Since then she has been sedentary at home.  She tells me that she is more short of breath.  The symptom scores below reflect that.  She thinks is because of the 30 pound weight gain because of sedentary living and social isolation following the onset of the pandemic.  She is frustrated by this.  Deep down she thinks her ILD is stable.  Pulmonary function test shows a drop in FVC but stability and DLCO suggesting weight gain is an ongoing issue for worsening dyspnea and pulmonary function test.  She is willing to have a high-dose flu shot today.  She also tells me that she is not taking  her Spiriva because it was expensive.  Although it did help her she is willing to try again and try to price it.  She also stated that her mom who never smoked had emphysema but her dad smoked heavily.  She has never been tested for alpha-1.   [place] -dropped. But tries not to use it in public    IMPRESSION: 1. Very mild scattered basilar subpleural reticulation and ground-glass similar to 01/29/2018. Nonspecific interstitial pneumonitis can not be excluded. Findings are indeterminate for UIP per consensus guidelines: Diagnosis of Idiopathic Pulmonary Fibrosis: An Official ATS/ERS/JRS/ALAT Clinical Practice Guideline. [place], Iss 5, [id number], Aug 26 2017. 2. Question cirrhosis. 3. Aortic atherosclerosis (ICD10-170.0). Coronary artery calcification. 4. Enlarged arteries, arterial hypertension. 5.  Emphysema (ICD10-J43.9).     Electronically Signed   By: Lorin Picket M.D.   On: 03/06/2019 16:59   ROS - per HPI     OV 06/23/2020  Subjective:  Patient ID: Patty [name], female ,  DOB: 15-Sep-1951 , age 58 y.o. , MRN: [id number] , ADDRESS: [address] [place] [address]   06/23/2020 -   Chief Complaint  Patient presents with   Follow-up    shortness of breath with exertion   Follow-up dyspnea that is multifactorial due to obesity, physical deconditioning and diastolic dysfunction  Follow-up  mild interstitial lung disease not otherwise specifieds -with stability 2018 through 2020 [non-IPF pattern/indeterminate   -; on observation  - last CT Mach 2020 0> July 2021 wthout progression  Follow-up mild pulmonary emphysema present on high-resolution CT chest - alpha 1 MM  - o2 with exertion  - advised spiriva oct 2020 but too expensive  HPI Noreen Mackintosh 71 y.o. -returns for routine follow-up.  She says she has been doing well.  She uses portable oxygen cranks it up to 4 L - 5 L and then walks every day for 30 minutes.  Overall she feels stable.  Her symptom score itself is stable.  However on pulmonary function testing her FVC shows a decline.  This decline started in October 2020 and since then it is stable.  She attributes this to weight gain.  There is what she told me last time as well.  However when we walked her she seemed to desaturate much more easily.  We also needed to corrected desaturation at 5 L.  She thinks it is because the portable oxygen system is not delivering oxygen currently and is a technical issue with a portable machine.  However the distance of desaturation seems to worsen.  There is no leg swelling although she has some mild varicose veins at baseline.  She is not on nighttime oxygen and she recollects attest that this some years ago and was normal back then.  Currently she is only using portable oxygen.  Her last echocardiogram was few years ago.  There is no hemoptysis or worsening cough.  She got a Covid vaccine in February 2021 and she thinks things might of changed since then.   After she left I was able to review the home  palliative care note from 01/11/2022:.  She expressed her life wishes that she does not want to be put on the ventilator.  Does not want a feeding tube.  She likes to pass away peacefully like her mother a DNR and MOST form were given.  They will address this in the future with her.  Results for CYNDA, SOULE ANN (MRN [id number]) as  of 06/23/2020 11:49  Ref. Range 05/17/2017 13:11 02/05/2018 12:14 08/28/2018 11:22 03/12/2019 14:47 10/01/2019 15:58 30 pound weight gain 06/09/2020 11:25  FVC-Pre Latest Units: L 2.61 2.57 2.80 2.73 2.38 2.42  FVC-%Pred-Pre Latest Units: % 84 83 94 91 81 82  FEV1-Pre Latest Units: L 1.83 1.71 1.96 1.83 1.65 1.53   Results for ANNE, BOLTZ ANN (MRN [id number]) as of 06/23/2020 11:49  Ref. Range 05/17/2017 13:11 02/05/2018 12:14 08/28/2018 11:22 03/12/2019 14:47 10/01/2019 15:58 06/09/2020 11:25  DLCO unc Latest Units: ml/min/mmHg 12.64 15.15 14.53 13.77 14.39 13.48  DLCO unc % pred Latest Units: % 52 62 62 72 75 70    ROS - per HPI     OV 02/23/2021  Subjective:  Patient ID: Patty [name], female , DOB: [birth year] , age 72 y.o. , MRN: [id number] , ADDRESS: [place] [id number] PCP [name], MD Patient Care Team: [name], MD as PCP - General (Family Medicine) Pyrtle, Lajuan Lines, MD as Consulting Physician (Gastroenterology) Shamleffer, Melanie Crazier, MD as Consulting Physician (Endocrinology) Brand Males, MD as Consulting Physician (Pulmonary Disease)  This Provider for this visit: Treatment Team:  Attending Provider: Brand Males, MD   Follow-up dyspnea that is multifactorial due to obesity, physical deconditioning and diastolic dysfunction, ILD and new dx Group 3 PAH - dec 2021  45 pppd prior smoking hix  Follow-up  mild interstitial lung disease not otherwise specifieds -with stability 2018 through 2020 [non-IPF pattern/indeterminate   -; on observation  - last CT Mach 2020 - > July 2021 without  progression  Inconsisent dxx of  pulmonary emphysema present on high-resolution CT chest - alpha 1 MM  - o2 with exertion  - advised spiriva oct 2020 but too expensive  Reported in only 1 CT. No mention in July 2021 CT  WHO group 3 Pulm htn with elevated PCWD - [facility] 12/07/20:   RA 13/12 mean 9 mmHg RV 48/8, EDP 12 PA 50/29 mean 38 PCWP 22/20 mean 20   O2 saturations: PA 62, Ao 97, SVC 64   CO 4.26 L/m, CI 1.96 Trans-pulmonic gradient 18 mmHg PVR 4.2 Woods Units  Grade 1 Disast dysfhn  - reportred July 2021 echo  Obesity  02/23/2021 -   Chief Complaint  Patient presents with   Follow-up    Doing ok     HPI Chalice Philbert 71 y.o. -returns for 36-monthfollow-up.  She has inconsistent diagnosis of emphysema.  Was reported in 1 scan but not another scans.  She has ILD.  She is foregoing biopsy.  She tells me overall that she is stable.  Although there are some weight gain.  She feels in the interim Metformin messed up her GI system and cause weight gain but she is glad to be out of it.  She had 6-minute walk test on 02/15/2021 at pulmonary rehabilitation.  She walked 7 and 15 feet in 6 minutes.  She did not break at all.  Her pulse ox dropped below 88% 2 times.  She corrected with 4 L of oxygen.  Her resting oxygen saturation was 96%.  And lowest was 86%.  She also had right heart catheterization in December 2021.  She has elevated pulmonary pressures but also elevated wedge.  Cardiology is following this up and she is on diuresis.  We spent some time discussing her care option.  She had pulmonary function test that shows stability in the last year and a half but declined in 2  years time.  Her high-resolution CT scan of the chest done in summer 2021 shows stability over 4 years.  She is not interested in antifibrotic's because of her colitis and also given her stability.  She does not want to go through pulmonary lung biopsy given the risks  She generally tries to avoid  medications.  She is happy going to pulmonary rehabilitation.  We discussed inhaled treprostinil as approved therapy for pulmonary hypertension -expressed to her the overall safety profile for over 20 years and approved recently for pulmonary hypertension and WHO group 3.  Explained the minimal side effect risk but she does not want to go through this  We discussed the option of participating in a clinical trial.  We discussed what is called the PULSE inhaled nitric oxide device study.  This is a 28-week plus study.  It was a device and a nitric oxide supply study.  She is actually interested in this.  She is participating clinical trials.  She understands the voluntary nature of this.  She understands the risks that come with clinical trials.  She understands the control of risk through close follow-up and interventions..  However her 6-minute walk test is quite adequate and also she starting pulmonary rehabilitation so she would not be able to participate in this trial for several months.  In addition she is aware that inhaled treprostinil which is standard of care therapy is currently on exclusion in this protocol current amendment.  She understands if she qualifies and chooses this trial that this would be a limitation.  We have given her the consent for review but at this time she does not qualify for this trial.  HRCT July 2021   IMPRESSION: 1. Spectrum of findings suggestive of a mild basilar predominant fibrotic interstitial lung disease without appreciable interval progression since baseline 05/12/2017 high-resolution chest CT. Favor NSIP, with UIP not excluded. Findings are indeterminate for UIP per consensus guidelines: Diagnosis of Idiopathic Pulmonary Fibrosis: An Official ATS/ERS/JRS/ALAT Clinical Practice Guideline. [place], Iss 5, [phone], Aug 26 2017. 2. Dilated main pulmonary artery, stable, suggesting chronic pulmonary arterial hypertension. 3. Aortic  Atherosclerosis (ICD10-I70.0).     Electronically Signed   By: Ilona Sorrel M.D.   On: 07/10/2020 15:50     OV 05/31/2021  Subjective:  Patient ID: Patty [name], female , DOB: October 02, 1951 , age 2 y.o. , MRN: [id number] , ADDRESS: [place] [id number] PCP [name], MD Patient Care Team: [name], MD as PCP - General (Family Medicine) Pyrtle, Lajuan Lines, MD as Consulting Physician (Gastroenterology) Shamleffer, Melanie Crazier, MD as Consulting Physician (Endocrinology) Brand Males, MD as Consulting Physician (Pulmonary Disease)  This Provider for this visit: Treatment Team:  Attending Provider: Brand Males, MD    Follow-up dyspnea that is multifactorial due to obesity, physical deconditioning and diastolic dysfunction, ILD and new dx Group 3 PAH - dec 2021  45 pppd prior smoking hix  Follow-up  mild interstitial lung disease not otherwise specifieds -with stability 2018 through 2020 [non-IPF pattern/indeterminate   -on observation  -last CT Mach 2020 - > July 2021 without progression  Inconsisent dxx of  pulmonary emphysema present on high-resolution CT chest - alpha 1 MM  - o2 with exertion  - advised spiriva oct 2020 but too expensive  Reported in only 1 CT. No mention in July 2021 CT  WHO group 3 Pulm htn with elevated PCWD - [facility] 12/07/20:  RA 13/12 mean 9 mmHg RV 48/8, EDP 12 PA 50/29 mean 38 PCWP 22/20 mean 20   O2 saturations: PA 62, Ao 97, SVC 64   CO 4.26 L/m, CI 1.96 Trans-pulmonic gradient 18 mmHg PVR 4.2 Woods Units  Grade 1 Disast dysfhn  - reportred July 2021 echo  Obesity   05/31/2021 -   Chief Complaint  Patient presents with   Follow-up    Pt states she has been doing okay since last visit. States she has begun pulmonary rehab which has helped.     HPI Evalee Gerard 71 y.o. -returns for follow-up.  She continues on supportive care with oxygen.  Room air at rest 3-5 L pulsed with  oxygen.  For the last few months she is on pulsed oxygen.  She uses 2 L nasal cannula at night with her CPAP.  She is completed pulmonary rehabilitation.  For the last few months she is using pulsed oxygen.  She feels this works better.  She is [facility] [employer] [facility].  She does elliptical or treadmill.  She feels her symptoms have improved.  She is continue to gain some weight though.  She again reported that she feels sensitive to medication and wants avoid antifibrotic's.  She was interested this time and discussing treprostinil versus pulsed research protocol for patients with interstitial lung disease and hypoxemic respiratory failure.  She has pulmonary hypertension based on 2021 December right heart catheterization.  We discussed the long-term safety profile of inhaled treprostinil.  Discussed the side effect profile.  After reflection about the fact the increase study showed improvement in 6-minute walk test, subgroup analysis potential modification of ILD and other composite outcome improvement.     IMPRESSION: 1. Spectrum of findings suggestive of a mild basilar predominant fibrotic interstitial lung disease without appreciable interval progression since baseline 05/12/2017 high-resolution chest CT. Favor NSIP, with UIP not excluded. Findings are indeterminate for UIP per consensus guidelines: Diagnosis of Idiopathic Pulmonary Fibrosis: An Official ATS/ERS/JRS/ALAT Clinical Practice Guideline. [place], Iss 5, [phone], Aug 26 2017. 2. Dilated main pulmonary artery, stable, suggesting chronic pulmonary arterial hypertension. 3. Aortic Atherosclerosis (ICD10-I70.0).     Electronically Signed   By: Ilona Sorrel M.D.   On: 07/10/2020 15:50   OV 09/07/2021  Subjective:  Patient ID: Patty [name], female , DOB: Apr 25, 1951 , age 66 y.o. , MRN: [id number] , ADDRESS: [place] [id number] PCP [name], MD Patient Care  Team: [name], MD as PCP - General (Family Medicine) Hilarie Fredrickson, Lajuan Lines, MD as Consulting Physician (Gastroenterology) [facility], Melanie Crazier, MD as Consulting Physician (Endocrinology) Brand Males, MD as Consulting Physician (Pulmonary Disease)  This Provider for this visit: Treatment Team:  Attending Provider: Brand Males, MD  Chronic hypoxemic respiratory failure due to all of the below ILD (interstitial lung disease) [facility])  -  Decline PF March 2020 through February 2022  but stable October 2020 through February 2022 on pulmonary function test -Stable on CT scan of the chest 2018 through July 2021 -Non-IPF likely - such as NSIP -Overall supportive care of plan without antifibrotic's given history of colitis [antifibrotic's can exacerbate this situation] and prior stability -You are using room air at rest with 3-5 L with exertion -pulse oxygen for the last few months    WHO group 3 pulmonary arterial hypertension ([place]) with elevaed PCWP to 20 on Dec 2021 - started tyvaso July 2022  [place] group 3  pulmonary arterial hypertension ([place]) with elevaed PCWP to 20 on Dec 2021 - -Continue CPAP at ngith with 2L [place] at night  09/07/2021 -   Chief Complaint  Patient presents with   Follow-up    PFT performed today.  States that she still becomes SOB with exertion. Pt is now on Tyvaso and states she finds herself needing her O2 more.     HPI Patty [name] 71 y.o. -returns for follow-up.  She started treprostinil in July 2022.  She says initially started doing better.  But once she got to 7 puffs 4 times daily she started getting more short of breath.  She felt overall that she is able to take a deep breath and feel less fatigued but when she got to that dose she said 1 time she walked across the room without oxygen which she normally walks without oxygen and she felt dizzy and winded.  Since then the symptoms have persisted.  She is also gained significant amount of  weight.  She is gained 16 pounds.  Her pulmonary function test is worse with a decline in both FVC and DLCO.  She says she is now needing 6 L of nasal cannula oxygen when she exercises.  Previously 5 L nasal cannula.  Symptom score wise and walking desaturation test wise she seems the same.  She does not want to do antifibrotic's because of prior history of colectomy.  At this point she is not sure if the treprostinil is helping her or hurting her or not doing anything.  OV 10/19/2021  Subjective:  Patient ID: Patty [name], female , DOB: [birth year] , age 10 y.o. 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 - diagnosed Sept 2022 -Non-IPF likely - such as NSIP -  Decline PF March 2020 through February 2022  but stable October 2020 through February 2022  -> declined sept 2022 PFT -Stable on CT scan of the chest 2018 through July 2021 -You are using room air at rest with 3-5 L with exertion -pulse oxygen for the last few months - started esbriet     WHO group 3 pulmonary arterial hypertension ([place]) with elevaed PCWP to 20 on Dec 2021 - started tyvaso July 2022 -> stopped sept 2022 of concern PFT declined could ave been due to tyvaso -> but restarted later in sept 2022   [place] group 3 pulmonary arterial hypertension ([place]) with elevaed PCWP to 20 on Dec 2021 - -Continue CPAP at ngith with 2L [place] at night   10/19/2021 -  followup  ILD   Type of visit: Telephone/Video Circumstance: COVID-19 national emergency Identification of patient Patty [name] with [birth year] and MRN [id number] - 2 person identifier Risks: Risks, benefits, limitations of telephone visit explained. Patient understood and verbalized agreement to proceed Anyone else on call: just patient Patient location: [phone] - her cell.  At her home This provider location: [place] 71 y.o. - esbriet still pending. BAck on tyvaso since  mid-late sept 2022. Now doing it 4x/day - 5x/each time.   Also reports sewage leak since April/May 2022 Occassional odor. Some leakage through cleanup pipe. FEels this has made her respiration worse instead of tyvaso. Initialy she thought was tyvaso but later realized might have been due to sweage leak and finally fixed in sept 2022.She feels after long term stability things got worse because of sweage leak. She feels since leak fixed (Same time went back on tyvaso) she is better. In gym mets were down to 2.1 but now upto 2.4 mets (baseline 2.7 mets). NExt visit with mid dec 2022.   She wants to know if progression is perrmanent  Esbriet sill in process. Arriving in 2 days. She wil start at 1 pill tid x 1 week -> 2 pill tid and maintain  She can do visit 11/04/21 - 3.00pm - with [name] - video to assess followup on esbriet    OV 11/11/21 - video APP  71 yo female former smoker followed for ILD and Emphysema  Medical history significant for Crohn's Disease and DCHF   Today's video visit is a 1 month follow up for ILD . She has recently started on Esbriet 2 weeks ago, does notice some symptoms of fatigue , joint aches and fatigue. Started on Esbriet 267 mg 2 tab Twice daily, this week. No v/d. Some nausea . No bloody stools. Some intermittent pain under right ribs on/off. No urinary issues. No syncope.  Remains on Tyvaso Four times a day   Remains on Oxygen 6l/m with activity and  2l/m At bedtime  . None at rest  No change in O2 requirements   OV 12/07/2021  Subjective:  Patient ID: Patty [name], female , DOB: April 18, 1951 , age 47 y.o. , MRN: [id number] , ADDRESS: [place] [id number] PCP [name], MD Patient Care Team: [name], MD as PCP - General (Family Medicine) Josue Hector, MD as PCP - Cardiology (Cardiology) Pyrtle, Lajuan Lines, MD as Consulting Physician (Gastroenterology) [facility], Melanie Crazier, MD as Consulting Physician (Endocrinology) Brand Males, MD as Consulting Physician (Pulmonary Disease)  This Provider for this visit: Treatment Team:  Attending Provider: Brand Males, MD    12/07/2021 -   Chief Complaint  Patient presents with   Follow-up    PFT performed 12/12. Pt states she began Esbriet November 2022 and said that she has had different side effects from the med.    Chronic hypoxemic respiratory failure due to all of the below ILD (interstitial lung disease) ([place]) - 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 - diagnosed Sept 2022 -Non-IPF likely - such as NSIP -  Decline PF March 2020 through February 2022  but stable October 2020 through February 2022  -> declined sept 2022 PFT -Stable on CT scan of the chest 2018 through July 2021 -You are using room air at rest with 3-5 L with exertion -pulse oxygen for the last few months - started esbriet  oct/nov 2022 - stopped GI intolerance 11/29/21    WHO group 3 pulmonary arterial hypertension ([place]) with elevaed PCWP to 20 on Dec 2021 - started tyvaso July 2022 -> stopped sept 2022 of concern PFT declined could ave been due to tyvaso -> but restarted later in sept 2022   WHO group 3 pulmonary arterial hypertension ([place]) with elevaed PCWP to 20 on Dec 2021 - -Continue CPAP at ngith with 2L [place] at  HPI Patty [name] 71 y.o. -at last visit we started  pirfenidone but as soon as she went up to 2 pills 3 times daily she started having  significant side effects including drop in urine output therefore she stopped.  Most of the side effects are GI.  Present even at 1 pill 3 times daily.  She says stopped pirfenidone yesterday and ever since then beginning to feel better especially today.  Continues on oxygen at the same level.  Is on inhaled treprostinil nebulizer.  Currently increased it to 6 times at 4 times a day.  Slowly going to work her self up.  She held off on escalating this while she was on pirfenidone start.  We looked at her file-per registry but need clarification from radiology about whether she has traction bronchiectasis.  In addition need to know the current status of her polycythemia.  Last CT scan of the chest was in 2021.  We discussed about the fact that her ILD is progressing.  She is lives alone.  No siblings in the city parents are deceased.  No children.  She has neighbors.  She is assigned to healthcare for her 102 her brother in [place].  She says she regularly updates him.  We did not discuss CODE STATUS.  Offered home palliative care and she is willing.  She knows that she needs to get medical alert.       OV 02/01/2022  Subjective:  Patient ID: Patty [name], female , DOB: 02-Feb-1951 , age 42 y.o. , MRN: [id number] , ADDRESS: [place] [id number] PCP [name], MD Patient Care Team: [name], MD as PCP - General (Family Medicine) Josue Hector, MD as PCP - Cardiology (Cardiology) Pyrtle, Lajuan Lines, MD as Consulting Physician (Gastroenterology) [facility], Melanie Crazier, MD as Consulting Physician (Endocrinology) Brand Males, MD as Consulting Physician (Pulmonary Disease)  This Provider for this visit: Treatment Team:  Attending Provider: Brand Males, MD    02/01/2022 -   Chief Complaint  Patient presents with   Follow-up    Pt states she is about the same since last visit.    HPI Patty [name] 71 y.o. -returns for follow-up.  She  tells me the inhaler presently is working really well for her.  She feels that given the Crohn's disease is better.  Her exercise tolerance on the treadmill is better.  Is increased to 2.5 METS.  Her shortness of breath score is also improved.  There is normal sewage exposure in the house.  She feels her lungs are improving.  She had a high-resolution CT scan of the chest.  Dr. Rosario Jacks the radiologist says there is ILA and not ILD.  She [as well as myself] no perplexed by this.  We looked at the CT scans ourselves.  In my view this may be early ILD.  It might be slightly worse in 2018.  Looking at lung function test status greater than 15% total decline in 3 years between 2019 and 2022.  However we do agree that the interstitial findings on the CT scan are mild.  Also she does not feel that she has significant emphysema.  She feels the CT report is over waiting the emphysema.  She says even though she smoked it was not a true pack per day.  She says the Spiriva did not help her.  Therefore she is a little bit perplexed with the emphysema diagnosis on the CT scan.  She and I agreed that we will discuss in a case conference.  I did submit her name for this.  At this point in time she is not on antifibrotic's and prefers not to be on it.  She is content being on treprostinil.     CT Chest data  CT Chest High Resolution  Result Date: 01/31/2022 CLINICAL DATA:  Interstitial lung disease, former smoker. EXAM: CT CHEST WITHOUT CONTRAST TECHNIQUE: Multidetector CT imaging of the chest was performed following the standard protocol without intravenous contrast. High resolution imaging of the lungs, as well as inspiratory and expiratory imaging, was performed. RADIATION DOSE REDUCTION: This exam was performed according to the departmental dose-optimization program which includes automated exposure control, adjustment of the mA and/or kV according to patient size and/or use of iterative reconstruction technique.  COMPARISON:  07/10/2020. FINDINGS: Cardiovascular: Atherosclerotic calcification of the aorta, aortic valve and coronary arteries. Pulmonic trunk is enlarged. Heart size within normal limits. No pericardial effusion. Mediastinum/Nodes: No pathologically enlarged mediastinal or axillary lymph nodes. Hilar regions are difficult to definitively evaluate without IV contrast but appear grossly unremarkable. Esophagus is grossly unremarkable. Lungs/Pleura: Centrilobular emphysema. Negative for subpleural reticulation, traction bronchiectasis/bronchiolectasis, ground-glass, architectural distortion or honeycombing. Subpleural densities and ground-glass seen on supine imaging largely clear on prone imaging. Mild bibasilar scarring and bronchiectasis. No pleural fluid. Airway is unremarkable. No air trapping. Upper Abdomen: Liver margin is slightly irregular. Visualized portions of the liver, gallbladder, adrenal glands and right kidney are otherwise unremarkable. 1.4 cm exophytic low-attenuation lesion off the interpolar left kidney is likely a cyst. Visualized portions of the spleen, pancreas, stomach and bowel are grossly. Scattered surgical clips in the abdomen. Upper abdominal lymph nodes are not enlarged by CT size criteria. Musculoskeletal: Degenerative changes in the spine. No worrisome lytic or sclerotic lesions. IMPRESSION: 1. Favor absence of interstitial lung disease based on prone imaging. 2. Bibasilar scarring and mild bronchiectasis. 3. Marginal irregularity the liver is indicative of cirrhosis. 4. Aortic atherosclerosis (ICD10-I70.0). Coronary artery calcification. 5.  Emphysema (ICD10-J43.9). 6. Enlarged pulmonic trunk indicative pulmonary arterial hypertension. Electronically Signed   By: Lorin Picket M.D.   On: 01/31/2022 11:53         OV 08/19/2022  Subjective:  Patient ID: Patty [name], female , DOB: 1951-05-09 , age 1 y.o. , MRN: [id number] , ADDRESS: [place]  [id number] PCP [name], MD Patient Care Team: [name], MD as PCP - General (Family Medicine) Josue Hector, MD as PCP - Cardiology (Cardiology) Pyrtle, Lajuan Lines, MD as Consulting Physician (Gastroenterology) [facility], Melanie Crazier, MD as Consulting Physician (Endocrinology) Brand Males, MD as Consulting Physician (Pulmonary Disease)  This Provider for this visit: Treatment Team:  Attending Provider: Brand Males, MD  Type of visit: Video Circumstance: patietn preferene Identification of patient Patty [name] with August 29, 1951 and MRN [id number] - 2 person identifier Risks: Risks, benefits, limitations of telephone visit explained. Patient understood and verbalized agreement to proceed Anyone else on call: jsut patient Patient location: her home This provider location: [address]; [place]; [place] [address]. [facility]. [phone]   08/19/2022 -  Followup above via video    HPI Patty [name] 71 y.o. -overall stable but maybe some more tired than usual. Goes to gym and works out treadmill (!5 min), elliptical 2-3h/week. Walks mostly in stores where she does better. Trying to lose weight- have not lose weight . She has not tried medications for weight loss yet but worried in the settling of Crohns she is  worried about side effect. STill on Tyvaso - 9 puff 4 times per day. Did  echo yesterday - gr1 ddx and very small pericardia effusion  per rreport. Had repeat CT April 20223 -> they are saying no ILD again. But she is on tyvaso and helping her dyspnea. She feels tyvaso since 2022 and church controlling sewage overflows in [place] being in remission has helped   RE sewage exposure  -is the nearby church behind her house and there ws intermittent exposure. She feels sewage exposure made her worse. She has an attorney  Using o2 6L [employer] with exertion. At rest  Today room air at rest - 96% and HR 77%   CT  chest HRCT- Apirl 2023  IMPRESSION: 1. Mild, bland appearing bandlike scarring of the bilateral lung bases, unchanged compared to recent prior examination. No evidence of fibrotic interstitial lung disease. 2. Coronary artery disease. 3. Enlargement of the main pulmonary artery, as can be seen in pulmonary hypertension. 4. Coarse, nodular contour of the partially imaged liver in the upper abdomen, suggesting cirrhosis.   Aortic Atherosclerosis (ICD10-I70.0).     Electronically Signed   By: Delanna Ahmadi M.D.   On: 03/31/2022 10:16     CT Chest da    PFT OV 09/22/2022  Subjective:  Patient ID: Patty [name], female , DOB: 1951/04/28 , age 40 y.o. , MRN: [id number] , ADDRESS: [place] [phone] PCP [name], MD Patient Care Team: [name], MD as PCP - General (Family Medicine) Josue Hector, MD as PCP - Cardiology (Cardiology) Pyrtle, Lajuan Lines, MD as Consulting Physician (Gastroenterology) [facility], Melanie Crazier, MD as Consulting Physician (Endocrinology) Brand Males, MD as Consulting Physician (Pulmonary Disease)  This Provider for this visit: Treatment Team:  Attending Provider: Brand Males, MD   Chronic hypoxemic respiratory failure due to all of the below ILD (interstitial lung disease) ([place]) - 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 - diagnosed Sept 2022 (in Feb a2023 and April 2023 they are saying no ILD and she feels is because crohns is under remission, tyvaso definitely since sep 2022, and resolution nearbyhome sewage exposure may 2022 - sept 2022) -Non-IPF likely - such as NSIP -  Decline PF March 2020 through February 2022  but stable October 2020 through February 2022  -> declined sept 2022 PFT -Stable on CT scan of the chest 2018 through July 2021  -You are using room air at rest with 3-5 L with exertion -pulse oxygen for the last few months - started esbriet  oct/nov 2022 - stopped GI intolerance  11/29/21    WHO group 3 pulmonary arterial hypertension ([place]) with elevaed PCWP to 20 on Dec 2021 - started tyvaso July 2022 -> stopped sept 2022 of concern PFT declined could ave been due to tyvaso -> but restarted later in sept 2022   [place] group 3 pulmonary arterial hypertension ([place]) with elevaed PCWP to 20 on Dec 2021 - -Continue CPAP at ngith with 2L [place] at   [place]  09/22/2022 -   Chief Complaint  Patient presents with   Follow-up    PFT performed today.  Pt states she has had good and bad days with her breathing especially when the weather changes it becomes affected.     HPI Patty [name] 71 y.o. -returns for follow-up.  Overall she feels she is doing stable but her lung function shows continued decline.  She says since last  year there is no further sewage exposure in the church behind her house.  She says the way they repaired it she is worried that there may be future risk of exposure.  It is great life charge on [employer].  The past that is Robinette H[place].  She is upset about the situation.  She is worried that the significant sewage exposure last year has continued to contribute to her lung damage.  There is a concept called hit-and-run hypothesis.  Currently she is not smoking.  But pulmonary function test shows continued decline alpha-1 antitrypsin is normal.  She feels the Tyvaso is helping her.  She is up-to-date with the COVID and flu shot this season.  Discussed RSV vaccine and she will have that.  She continues CPAP for sleep apnea.  Of note previous CT scan has reported emphysema.  She states.  I did not help her in the past but she is willing to try sample Trelegy.    SYMPTOM SCALE - ILD 03/12/2019  10/07/2019 30# wt gain  06/23/2020 250# 09/07/2021 266# - tyvaso since July 2022 12/07/2021 272# 02/01/2022 tyvaso slightly so much better 09/22/2022   O2 use RA ra   2-6L 2-6L 2-6L  Shortness of Breath 0 -> 5 scale with 5 being worst  (score 6 If unable to do)   0 0    At rest 0 0 0 0 with o2 0 0 0  Simple tasks - showers, clothes change, eating, shaving 0 0  0 0 1 1  Household (dishes, doing bed, laundry) 0 0 0 0 _0 Shopping 0 0 0 0 1 0 0  Walking at own pace 0 4.5 4._1 1.5  Walking up Stairs 1-_2 Total (40 - 48) Dyspnea Score 2 9.5 9.5 6 al with o2 14 6 13.5  How bad is your cough? 0 0 0 0 0 At the time of onset of0 0  How bad is your fatigue 0 1 0 _3 nausea   0 0 0 0 0  vomit   0 0 0 0 0  diarrhe   0 _4 0  axniety   _5 depression   0 1 1 0 0        Simple office walk 185 feet x  3 laps goal with forehead probe 03/12/2019  10/07/2019  06/23/2020  09/07/2021   O2 used Room air Room air Room air ra  Number laps completed 3 2nd lap 1 lap 1 lap  Comments about pace slow slow slow   Resting Pulse Ox/HR 100% and 85/min 96% and 72/min 97% and 77 97% and 76  Final Pulse Ox/HR 88% and 135/min 86% and 105/min 87% and 112/min 83 and 112/min  Desaturated </= 88% yes     Desaturated <= 3% points Yes, 12     Got Tachycardic >/= 90/min yes     Symptoms at end of test Mild dyspnea  Corrected with 5L [place]. Even with 4L [place] downt to  87% Corrected with 6LNC at 94$ for antoher 2lpas  Miscellaneous comments x  ? worse    CT Chest data  No results found.    PFT     Latest Ref Rng & Units 09/22/2022    2:27 PM 12/06/2021   12:57 PM 09/07/2021   12:54 PM 02/22/2021   11:52 AM 06/09/2020   11:25 AM  10/01/2019    3:58 PM 03/12/2019    2:47 PM  PFT Results  FVC-Pre L 2.16  P 2.32  2.28  2.38  2.42  2.38  2.73   FVC-Predicted Pre % 76  P 80  79  81  82  81  91   Pre FEV1/FVC % % 60  P 60  62  63  63  69  67   FEV1-Pre L 1.30  P 1.38  1.41  1.50  1.53  1.65  1.83   FEV1-Predicted Pre % 61  P 63  65  68  68  74  80   DLCO uncorrected ml/min/mmHg 10.92  P 11.82  11.94  15.05  13.48  14.39  13.77   DLCO UNC% % 57  P 62  63  79  70  75  72   DLCO corrected ml/min/mmHg 10.61  P 11.45  11.94   27.44  12.89     DLCO COR %Predicted % 56  P 60  63  144  67     DLVA Predicted % 66  P 66  70  152  70  75  72     P Preliminary result       has a past medical history of Abnormal nuclear stress test, Anxiety, Aortic atherosclerosis (HCC), Arthritis, Chronic diastolic CHF (congestive heart failure) (HCC), Coronary artery calcification seen on CT scan, Crohn disease (HCC), DVT (deep venous thrombosis) (HCC), Dyspnea, Grade I diastolic dysfunction, Hyperlipidemia, ILD (interstitial lung disease) ([place]), OSA on CPAP, Pernicious anemia, Polycythemia vera ([place]), Pulmonary embolism ([place]) (2010), Pulmonary hypertension ([place]), and Sleep disorder (01/10/2020).   reports that she quit smoking about 13 years ago. Her smoking use included cigarettes. She has a 45.00 pack-year smoking history. She has never used smokeless tobacco.  Past Surgical History:  Procedure Laterality Date    RIGHT BREAST LUMPECTOMY WITH RADIOACTIVE SEED LOCALIZATION (Right Breast)  02/04/2021   BIOPSY  09/30/2019   Procedure: BIOPSY;  Surgeon: Jerene Bears, MD;  Location: [facility];  Service: Gastroenterology;;   BREAST LUMPECTOMY WITH RADIOACTIVE SEED LOCALIZATION Right 02/04/2021   Procedure: RIGHT BREAST LUMPECTOMY WITH RADIOACTIVE SEED LOCALIZATION;  Surgeon: Coralie Keens, MD;  Location: [facility];  Service: General;  Laterality: Right;   CARDIAC CATHETERIZATION     COLON RESECTION  1993   COLONOSCOPY WITH PROPOFOL N/A 09/30/2019   Procedure: COLONOSCOPY WITH PROPOFOL;  Surgeon: Jerene Bears, MD;  Location: [facility];  Service: Gastroenterology;  Laterality: N/A;   HIP ARTHROPLASTY Right    x 2, initial right THA, then subsequent right THA revision    LEFT HEART CATH AND CORONARY ANGIOGRAPHY N/A 07/28/2017   Procedure: Left Heart Cath and Coronary Angiography;  Surgeon: Burnell Blanks, MD;  Location: [facility];  Service: Cardiovascular;  Laterality: N/A;   POLYPECTOMY  09/30/2019   Procedure:  POLYPECTOMY;  Surgeon: Jerene Bears, MD;  Location: [facility];  Service: Gastroenterology;;   RIGHT HEART CATH N/A 12/07/2020   Procedure: RIGHT HEART CATH;  Surgeon: Sherren Mocha, MD;  Location: [facility];  Service: Cardiovascular;  Laterality: N/A;    Allergies  Allergen Reactions   Victoza [Liraglutide] Shortness Of Breath   Metformin And Related Diarrhea    Increased moodiness   Onion Other (See Comments)    sereve diarrhea, stomach pains (Crohn's flares)   Lasix [Furosemide] Itching    Hypersensitivity - pt can take as needed    Pirfenidone Other (See  Comments)    GI side effects   Potassium-Containing Compounds Other (See Comments)    Due to chron's, difficult passing though kidneys not allowing absorption in the body    Prednisone Other (See Comments)    Altered mood   Sulfa Drugs Cross Reactors Other (See Comments)    "feels like bugs crawling on me"    Immunization History  Administered Date(s) Administered   Fluad Quad(high Dose 65+) 10/07/2019, 09/09/2020, 11/27/2021, 09/19/2022   Influenza, High Dose Seasonal PF 08/28/2018   Influenza-Unspecified 11/23/2017   PFIZER Comirnaty(Gray Top)Covid-19 Tri-Sucrose Vaccine 09/19/2022   PFIZER(Purple Top)SARS-COV-2 Vaccination 02/16/2020, 03/17/2020, 12/14/2020, 05/14/2021, 10/27/2021   Pfizer Covid-19 Vaccine Bivalent Booster 59yr & up 10/27/2021   Pneumococcal Conjugate-13 02/13/2018   Pneumococcal Polysaccharide-23 03/12/2019    Family History  Problem Relation Age of Onset   Glaucoma Mother    High blood pressure Mother    High Cholesterol Mother    Sleep apnea Mother    Diabetes Mellitus II Father    Sleep apnea Father    Anxiety disorder Father    COPD Brother    Heart disease Brother    Non-Hodgkin's lymphoma Brother    Diabetes Mellitus II Brother    Glaucoma Brother    Colon cancer Neg Hx    Esophageal cancer Neg Hx    Pancreatic cancer Neg Hx    Stomach cancer Neg Hx    Liver disease  Neg Hx      Current Outpatient Medications:    amoxicillin (AMOXIL) 500 MG capsule, Take 2,000 mg by mouth See admin instructions. Before dental procedures, Disp: , Rfl:    aspirin EC 81 MG tablet, Take 81 mg by mouth daily., Disp: , Rfl:    Calcium Carbonate-Vit D-Min (CALCIUM 1200) 1200-1000 MG-UNIT CHEW, Chew 1 tablet by mouth daily., Disp: , Rfl:    CANNABIDIOL PO, Take 1 Dose by mouth 2 (two) times daily as needed (anxiety/sleep.). CBD Oil, Disp: , Rfl:    cetirizine (ZYRTEC) 10 MG tablet, Take 10 mg by mouth daily as needed (sinus/allergies.)., Disp: , Rfl:    cetirizine-pseudoephedrine (ZYRTEC-D) 5-120 MG tablet, Take 1 tablet by mouth daily as needed (sinus headaches.)., Disp: , Rfl:    clonazePAM (KLONOPIN) 0.5 MG tablet, Take 0.5-1 tablets (0.25-0.5 mg total) by mouth daily as needed for anxiety., Disp: 90 tablet, Rfl: 1   cyanocobalamin (,VITAMIN B-12,) 1000 MCG/ML injection, INJECT 1 ML (1,000 MCG TOTAL) INTO THE MUSCLE EVERY 30 (THIRTY) DAYS. INJECT 1 ML (1,000 MCG) INTO THE MUSCLE EVERY 30 DAYS, Disp: 12 mL, Rfl: 0   FLUoxetine (PROZAC) 20 MG capsule, Take 1 capsule (20 mg total) by mouth daily., Disp: 90 capsule, Rfl: 3   fluticasone (FLONASE) 50 MCG/ACT nasal spray, Place 1-2 sprays into both nostrils daily as needed for allergies or rhinitis., Disp: , Rfl:    Fluticasone-Umeclidin-Vilant (TRELEGY ELLIPTA) 100-62.5-25 MCG/ACT AEPB, Inhale 1 puff into the lungs daily., Disp: 14 each, Rfl: 0   furosemide (LASIX) 20 MG tablet, Take 1 tablet (20 mg total) by mouth as needed for fluid., Disp: 90 tablet, Rfl: 0   Glycerin-Polysorbate 80 (REFRESH DRY EYE THERAPY OP), Place 1 drop into both eyes daily., Disp: , Rfl:    hydrocortisone (PROCTOZONE-HC) 2.5 % rectal cream, Place 1 application rectally 2 (two) times daily as needed (Crohn's flare)., Disp: 28 g, Rfl: 2   ibuprofen (ADVIL) 200 MG tablet, Take 400-800 mg by mouth every 8 (eight) hours as needed (pain.)., Disp: , Rfl:  mesalamine (PENTASA) 250 MG CR capsule, Take 250-500 mg by mouth 4 (four) times daily as needed (crohn's disease flare ups)., Disp: , Rfl:    Multiple Vitamins-Minerals (ICAPS AREDS 2 PO), Take 1 tablet by mouth daily., Disp: , Rfl:    prednisoLONE acetate (PRED FORTE) 1 % ophthalmic suspension, Place 1 drop into both eyes 2 (two) times daily as needed (Uveitis)., Disp: , Rfl:    Probiotic Product (PROBIOTIC PO), Take 1 capsule by mouth 2 (two) times a week. As needed, Disp: , Rfl:    traMADol (ULTRAM) 50 MG tablet, Take 1 tablet (50 mg total) by mouth every 6 (six) hours as needed., Disp: 20 tablet, Rfl: 0   traZODone (DESYREL) 50 MG tablet, TAKE 0.5-1 TABLETS BY MOUTH AT BEDTIME AS NEEDED FOR SLEEP., Disp: 90 tablet, Rfl: 3   Treprostinil (TYVASO REFILL) 0.6 MG/ML SOLN, Inhale 54 mcg into the lungs in the morning, at noon, in the evening, and at bedtime., Disp: 87 mL, Rfl: 11      Objective:   Vitals:   09/22/22 1458  BP: 126/72  Pulse: 82  Temp: 98.8 F (37.1 C)  TempSrc: Oral  SpO2: 95%  Weight: 272 lb (123.4 kg)  Height: _0  (1.626 m)    Estimated body mass index is 46.69 kg/m as calculated from the following:   Height as of this encounter: _1  (1.626 m).   Weight as of this encounter: 272 lb (123.4 kg).  _2 @  [employer]   09/22/22 1458  Weight: 272 lb (123.4 kg)     Physical Exam   General: No distress. obese Neuro: Alert and Oriented x 3. GCS 15. Speech normal Psych: Pleasant Resp:  Barrel Chest - no.  Wheeze - no, Crackles - no, No overt respiratory distress CVS: Normal heart sounds. Murmurs - no Ext: Stigmata of Connective Tissue Disease - no HEENT: Normal upper airway. PEERL +. No post nasal drip        Assessment:       ICD-10-CM   1. Chronic hypoxemic respiratory failure (HCC)  J96.11     2. ILD (interstitial lung disease) ([place])  J84.9     3. WHO group 3 pulmonary arterial hypertension (HCC)  I27.23          Plan:      Patient Instructions  Chronic hypoxemic respiratory failure  ILD (interstitial lung disease) ([place])  WHO group 3 pulmonary arterial hypertension ([place]) with elevaed PCWP to 20 on Dec 2021     Right now there is no seewage exposure since sept 2022 that you know of from church behind your house  Lung function progressively getting worse - could have been hit and run [place] with lungs following prior sewage exposure as a trigger  Currently no flare up   Plan - continue tyvaso at curent dose --Continue oxygen 3-6 L nasal cannula pulsed with exertion and at night 2 L nasal cannula and room air at rest -Continue exercise program  -However continue to keep her pulse ox at greater than or equal to 88% using a pulse oximeter --Make attempts to lose weight - talk to your PCP [name], MD abut weight loss drugs -take handicap driver sticker - glad you are uptodate with flu shot and covid mRNA vaccine -get RSV vacccine on your own  OSA on CPAP  Stable  Plan  -Continue CPAP at ngith with 2L [place] at night  per the advice of his sleep doctor - at some point  can check o2 level at night   Associated emphysema on CT -feb 2023 Normal Alpha 1 AT  - prior spiriva did not help  Plan  -try sample trelegy   Follow-up  -3  months s face to face with Dr Chase Caller but afte PFT; 30 min visit   -  ( Level 05 visit: Estb 40-54 min in  visit type: on-site physical face to visit  in total care time and counseling or/and coordination of care by this undersigned MD - Dr Brand Males. This includes one or more of the following on this same day 09/22/2022: pre-charting, chart review, note writing, documentation discussion of test results, diagnostic or treatment recommendations, prognosis, risks and benefits of management options, instructions, education, compliance or risk-factor reduction. It excludes time spent by the [facility] or office staff in the care of the patient. Actual time 65  min)   SIGNATURE    Dr. Brand Males, M.D., F.C.C.P,  Pulmonary and Critical Care Medicine Staff Physician, [place] Director - Interstitial Lung Disease  Program  [facility] [place], [place], [address]  Pager: [phone], If no answer or between  15:00h - 7:00h: call [phone] Telephone: [phone]  4:06 PM 09/22/2022

## 2022-09-22 NOTE — Patient Instructions (Signed)
Spirometry/DLCO performed today.

## 2022-09-22 NOTE — Patient Instructions (Addendum)
Chronic hypoxemic respiratory failure  ILD (interstitial lung disease) ([place])  WHO group 3 pulmonary arterial hypertension ([place]) with elevaed PCWP to 20 on Dec 2021     Right now there is no seewage exposure since sept 2022 that you know of from church behind your house  Lung function progressively getting worse - could have been hit and run [place] with lungs following prior sewage exposure as a trigger  Currently no flare up   Plan - continue tyvaso at curent dose --Continue oxygen 3-6 L nasal cannula pulsed with exertion and at night 2 L nasal cannula and room air at rest -Continue exercise program  -However continue to keep her pulse ox at greater than or equal to 88% using a pulse oximeter --Make attempts to lose weight - talk to your PCP [name], MD abut weight loss drugs -take handicap driver sticker - glad you are uptodate with flu shot and covid mRNA vaccine -get RSV vacccine on your own  OSA on CPAP  Stable  Plan  -Continue CPAP at ngith with 2L [place] at night  per the advice of his sleep doctor - at some point can check o2 level at night   Associated emphysema on CT -feb 2023 Normal Alpha 1 AT  - prior spiriva did not help  Plan  -try sample trelegy   Follow-up  -3  months s face to face with Dr Chase Caller but afte PFT; 30 min visit   -

## 2022-09-27 ENCOUNTER — Telehealth: Payer: Self-pay | Admitting: *Deleted

## 2022-09-27 NOTE — Patient Outreach (Signed)
  Care Coordination   09/27/2022 Name: Patty [name] MRN: [id number] DOB: 1951/03/22   Care Coordination Outreach Attempts:  A second unsuccessful outreach was attempted today to offer the patient with information about available care coordination services as a benefit of their health plan.     Follow Up Plan:  Additional outreach attempts will be made to offer the patient care coordination information and services.   Encounter Outcome:  No Answer  Care Coordination Interventions Activated:  No   Care Coordination Interventions:  No, not indicated    Raina Mina, RN Care Management Coordinator [place] Office [phone]

## 2022-10-05 ENCOUNTER — Other Ambulatory Visit: Payer: Self-pay | Admitting: Family Medicine

## 2022-10-07 ENCOUNTER — Encounter: Payer: Self-pay | Admitting: Family Medicine

## 2022-10-07 ENCOUNTER — Other Ambulatory Visit: Payer: Self-pay

## 2022-10-07 ENCOUNTER — Telehealth: Payer: Self-pay

## 2022-10-07 MED ORDER — CYANOCOBALAMIN 1000 MCG/ML IJ SOLN: 1000.0000 ug | INTRAMUSCULAR | 0 refills | Status: DC

## 2022-10-07 MED ORDER — "LUER LOCK SAFETY SYRINGES 25G X 1"" 3 ML MISC": 5 refills | Status: DC

## 2022-10-07 NOTE — Patient Outreach (Signed)
  Care Coordination   10/07/2022 Name: Patty [name] MRN: [id number] DOB: 10-10-1951   Care Coordination Outreach Attempts:  A third unsuccessful outreach was attempted today to offer the patient with information about available care coordination services as a benefit of their health plan.   Follow Up Plan:  No further outreach attempts will be made at this time. We have been unable to contact the patient to offer or enroll patient in care coordination services  Encounter Outcome:  No Answer  Care Coordination Interventions Activated:  No   Care Coordination Interventions:  No, not indicated    Peter Garter RN, BSN,CCM, [facility] [phone]

## 2022-10-11 ENCOUNTER — Ambulatory Visit
Admission: RE | Admit: 2022-10-11 | Discharge: 2022-10-11 | Disposition: A | Discharge status: Home / Self Care | Payer: Medicare Other | Source: Ambulatory Visit | Attending: Family Medicine | Admitting: Family Medicine

## 2022-10-11 VITALS — BP 139/66 | HR 86 | Temp 97.9°F | Resp 18

## 2022-10-11 DIAGNOSIS — M79604 Pain in right leg: Secondary | ICD-10-CM | POA: Insufficient documentation

## 2022-10-11 DIAGNOSIS — S8991XA Unspecified injury of right lower leg, initial encounter: Secondary | ICD-10-CM

## 2022-10-11 DIAGNOSIS — M25561 Pain in right knee: Secondary | ICD-10-CM | POA: Diagnosis not present

## 2022-10-11 NOTE — ED Triage Notes (Signed)
Pt c/o RLE pain since yesterday after turning her leg wrong while pulling a dolly and felt like tearing. States pulled a muscle to same area 2 wks ago

## 2022-10-11 NOTE — ED Provider Notes (Signed)
Patient complains of a pulled muscle in her right lower extremity that occurred 2 weeks ago.  Patient states she twisted her right lower extremity awkwardly yesterday and felt a popping and tearing sensation.  Patient redirected to orthopedics for further evaluation, ultrasound of tendons of the knee are indicated which we cannot order here at urgent care.  Patient verbalized understanding   Lynden Oxford Scales, PA-C 10/11/22 1358

## 2022-10-11 NOTE — ED Triage Notes (Signed)
Provider at bedside referring pt to [facility]

## 2022-10-13 ENCOUNTER — Telehealth: Payer: Self-pay

## 2022-10-13 NOTE — Patient Instructions (Signed)
Visit Information  Thank you for taking time to visit with me today. Please don't hesitate to contact me if I can be of assistance to you.   Following are the goals we discussed today:   Goals Addressed             This Visit's Progress    COMPLETED: Care Coordination Activities - no follow up required       Care Coordination Interventions: Provided education to patient re: care coordination services, Annual Wellness Visit  Assessed social determinant of health barriers Declines Annual Wellness Visit         If you are experiencing a Mental Health or [place] or need someone to talk to, please call the Suicide and Crisis Lifeline: 988 call the [place] National Suicide Prevention Lifeline: (209)673-9561 or TTY: 618-319-4442 TTY 630-320-4766) to talk to a trained counselor call 1-800-273-TALK (toll free, 24 hour hotline) go to [facility] [place] [phone]) call 911   The patient verbalized understanding of instructions, educational materials, and care plan provided today and DECLINED offer to receive copy of patient instructions, educational materials, and care plan.   No further follow up required:   Peter Garter RN, [place], [facility] [phone]

## 2022-10-13 NOTE — Patient Outreach (Signed)
  Care Coordination   Initial Visit Note   10/13/2022 Name: Patty [name] MRN: [id number] DOB: [birth year]  Patty [name] is a 71 y.o. year old female who sees Patty Arnt, MD for primary care. I spoke with  Patty [name] by phone today.  What matters to the patients health and wellness today?  No concerns today States she manages her breathing issues well.  Declines Annual Wellness visit    Goals Addressed             This Visit's Progress    COMPLETED: Care Coordination Activities - no follow up required       Care Coordination Interventions: Provided education to patient re: care coordination services, Annual Wellness Visit  Assessed social determinant of health barriers Declines Annual Wellness Visit         SDOH assessments and interventions completed:  Yes  SDOH Interventions Today    Flowsheet Row Most Recent Value  SDOH Interventions   Food Insecurity Interventions Intervention Not Indicated  Housing Interventions Intervention Not Indicated  Transportation Interventions Intervention Not Indicated  Utilities Interventions Intervention Not Indicated        Care Coordination Interventions Activated:  Yes  Care Coordination Interventions:  Yes, provided   Follow up plan: No further intervention required.   Encounter Outcome:  Pt. Visit Completed {THN Tip this will not be part of the note when signed-REQUIRED REPORT FIELD DO NOT DELETE (Optional):[id number] Peter Garter RN, BSN,CCM, [facility] [phone]

## 2022-10-31 DIAGNOSIS — H40013 Open angle with borderline findings, low risk, bilateral: Secondary | ICD-10-CM | POA: Diagnosis not present

## 2022-11-02 ENCOUNTER — Telehealth: Payer: Self-pay | Admitting: Family Medicine

## 2022-11-02 NOTE — Telephone Encounter (Signed)
Copied from [place] [phone]. Topic: Medicare AWV >> Nov 02, 2022 12:06 PM Gillis Santa wrote: Reason for CRM: LVM PATIENT TO CALL [phone] TO SCHEDULE AWVS Patty [name]

## 2022-11-03 ENCOUNTER — Encounter: Payer: Self-pay | Admitting: Internal Medicine

## 2022-11-03 ENCOUNTER — Other Ambulatory Visit: Payer: Medicare Other | Admitting: Internal Medicine

## 2022-11-03 VITALS — BP 132/90 | HR 72 | Temp 97.7°F

## 2022-11-03 DIAGNOSIS — G479 Sleep disorder, unspecified: Secondary | ICD-10-CM | POA: Diagnosis not present

## 2022-11-03 DIAGNOSIS — J9611 Chronic respiratory failure with hypoxia: Secondary | ICD-10-CM | POA: Diagnosis not present

## 2022-11-03 DIAGNOSIS — K50919 Crohn's disease, unspecified, with unspecified complications: Secondary | ICD-10-CM

## 2022-11-03 DIAGNOSIS — J849 Interstitial pulmonary disease, unspecified: Secondary | ICD-10-CM | POA: Diagnosis not present

## 2022-11-03 DIAGNOSIS — Z515 Encounter for palliative care: Secondary | ICD-10-CM

## 2022-11-03 DIAGNOSIS — G4733 Obstructive sleep apnea (adult) (pediatric): Secondary | ICD-10-CM | POA: Diagnosis not present

## 2022-11-03 NOTE — Progress Notes (Signed)
[facility] Visit Telephone: [phone]  Fax: [phone]   Date of encounter: 11/03/22 3:42 PM PATIENT NAME: Patty [name] [address] [place] [address]   [phone] (home)  DOB: March 03, 1951 MRN: [id number] PRIMARY CARE PROVIDER:    [name], MD,  [place] [address] [phone]  REFERRING PROVIDER:   [name], [place] [address],  [place] [address] [phone]  RESPONSIBLE PARTY:    Contact Information     Name Relation Home Work Mobile   [place] Brother   [phone]        I met face to face with patient and family in her home. Palliative Care was asked to follow this patient by consultation request of  [name], MD to address advance care planning and complex medical decision making. This is follow-up visit.                                     ASSESSMENT AND PLAN / RECOMMENDATIONS:   Advance Care Planning/Goals of Care: Goals include to maximize quality of life and symptom management. Patient/health care surrogate gave his/her permission to discuss.Our advance care planning conversation included a discussion about:    The value and importance of advance care planning  Experiences with loved ones who have been seriously ill or have died  Exploration of personal, cultural or spiritual beliefs that might influence medical decisions  Exploration of goals of care in the event of a sudden injury or illness  Identification  of a healthcare agent --brother who lives in [facility] Review and updating or creation of an  advance directive document . Decision not to resuscitate or to de-escalate disease focused treatments due to poor prognosis. CODE STATUS:  DNR, limited additional interventions, determine use or limitation of abx when infection occurs--pt wants probiotics with them, IVF trial, and no feeding tubes  Symptom  Management/Plan: 1. Chronic respiratory failure with hypoxia (HCC) -continue current oxygen therapy -some questions about etiology ILD vs some component of COPD though pt does not wheeze and has not really had typical exacerbations  2. ILD (interstitial lung disease) ([place]) -original dx, continues on treatments for this though imaging was not fully supportive  3. Crohn's disease with complication, unspecified gastrointestinal tract location ([place]) -under better control since ILD tx started  4. OSA on CPAP -continue cpap as ordered  5. Sleep disorder -continue treatment as she's doing in hpi, I had prescribed the trazodone which she only uses when her OTC meds are ineffective  6. Palliative care by specialist -shared with patient that she no longer meets criteria to be followed by palliative are at home and that she will receive a letter with contact info in the event she should have progression and require hospice care--Dr. Chase Caller could reconsult [facility]  I spent 60 minutes providing this consultation. More than 50% of the time in this consultation was spent in counseling and care coordination.    PPS: 60%--needing a bit more help with her housework--neighbor helping some  HOSPICE ELIGIBILITY/DIAGNOSIS: not at present/ILD with chronic resp failure  Chief Complaint: Follow-up palliative visit  HISTORY OF PRESENT ILLNESS:  Patty [name] is a 71 y.o. year old female  with chronic respiratory failure with hypoxia thought to be due to ILD and possibly some COPD, obesity, hyperlipidemia, OSA on CPAP, osteopenia, pulmonary htn, Crohn's  disease and sleep disorder among others seen in palliative f/u at home.   Shortness of breath is getting worse. Tells me her CT did not get read as ILD for this.  She did not have significant pulmonary htn per cardiology.  Says her sats will remain wnl until she does things continuously, weather, and day variable, she has HR 135 when exerts, 125 when doing  her day to day routine of chores in home.  She is realizing she has to let go of some of her responsiblities.  Will mentally and physically push herself.  She does wonder some about deconditioning.  She is using 5L with exertion.  Often takes off at rest.   Neighbor his helping her with some outside leaves and projects.  She is trying to be more physical but more careful.  Has been to podiatry and different shoes were recommended.  She got [place] sandals.  Was gradually increasing how long she wears them.  Tries to do little things each day instead of everything at once like she once did.  Dropped something wearing the sandals, went to pick it up and she pulled something in the back of her leg.  Put ice on her calf and did less until it got better--took 2-3 wks to get better. Turned wrong rolling dolly with a table on it.  She turned and tweaked herself in her right calf.  Then pain was constant and unrelenting.  Christy her neighbor helped her finish the task.  This time it swelled.  Iced, elevated.  She went to [facility].  As sent over to ortho right away b/c urgent care didn't have [name].  [name] was offered there. A wrap was provided for it.  If she works outside, she'll put on the brace/wrap.  Today is the best it's been about 3 wks later.  She was having tingling and numbness in the foot/leg.  She took a muscle relaxer which did help the first day, but then she had bad side effects.  Sounds like it was tizanidine.  She got blurred vision, flu-like symptoms, more sob with normal sats, nervousness and one night she did not sleep at all, then metallic taste in her mouth.  Took it just for 3 days.  Tried a 1/2 instead when she had the side effects, but they continued several days after even stopping it.    She is having a little swelling of her ankles now.    She has cut down on her red meat (more chicken), trying to keep sugar at 25g per day.  She started drinking premier protein shakes.  Still eats a  good breakfast and dinner.  Tried to increase fruits especially apples.  Says she'll get obsessive, then frustrated and would binge on a bag of cookies.    Notes she was feeling her nerves in her body as she'd try to fall asleep.  Muscle relaxer made that worse though it helped the pain and her rest. Also happens after she does a lot.  She also has tremors then.    Tried trelegy for about a week.  She was able to notice her heart pounding more.  She had nervousness, hand tremors (had some even before and after stopping).did not notice an improvement in breathing but air felt heavy.  Did try again after stopping a few days.  Also did day before and day she did the leaves.  She thinks she'd benefit more from a rescue inhaler.  BP is usually 102-107/87 ish when she checks it.    Will sometimes take a 1/2 trazodone which does help when nothing else works, but full one made her quite groggy all the next day.  Usually clonazepam and melatonin do the trick.  Sees sleep specialist.  May use topiramate if nothing else helps--uses 1/2 pill (had from weight loss specialist).  History obtained from review of EMR, discussion with primary  team, and interview with family, facility staff/caregiver and/or Ms. Pillay  I reviewed available labs, medications, imaging, studies and related documents from the EMR.  Records reviewed and summarized above.   ROS Review of Systems See hpi  Physical Exam: Vitals:   11/03/22 1521  BP: (!) 132/90  Pulse: 72  Temp: 97.7 F (36.5 C)  SpO2: 99%   There is no height or weight on file to calculate BMI. Wt Readings from Last 500 Encounters:  09/22/22 272 lb (123.4 kg)  09/15/22 269 lb (122 kg)  08/01/22 269 lb (122 kg)  02/01/22 266 lb (120.7 kg)  01/31/22 266 lb 9.6 oz (120.9 kg)  12/07/21 272 lb 9.6 oz (123.7 kg)  11/30/21 275 lb (124.7 kg)  09/07/21 266 lb 6.4 oz (120.8 kg)  05/31/21 259 lb 12.8 oz (117.8 kg)  04/13/21 256 lb 6.3 oz (116.3 kg)  03/30/21  255 lb 11.7 oz (116 kg)  03/16/21 257 lb 0.9 oz (116.6 kg)  03/02/21 256 lb 13.4 oz (116.5 kg)  02/23/21 255 lb 6.4 oz (115.8 kg)  02/15/21 257 lb 8 oz (116.8 kg)  02/04/21 255 lb (115.7 kg)  02/01/21 255 lb (115.7 kg)  01/28/21 255 lb 11.2 oz (116 kg)  01/13/21 257 lb (116.6 kg)  12/11/20 258 lb 9.6 oz (117.3 kg)  12/07/20 255 lb (115.7 kg)  11/25/20 261 lb (118.4 kg)  10/21/20 253 lb (114.8 kg)  10/08/20 253 lb 12.8 oz (115.1 kg)  09/09/20 256 lb 6.4 oz (116.3 kg)  06/23/20 250 lb 9.6 oz (113.7 kg)  04/29/20 254 lb 9.6 oz (115.5 kg)  03/16/20 257 lb 9.6 oz (116.8 kg)  02/12/20 247 lb (112 kg)  01/16/20 248 lb (112.5 kg)  01/10/20 245 lb (111.1 kg)  11/28/19 245 lb (111.1 kg)  10/24/19 246 lb (111.6 kg)  10/07/19 251 lb (113.9 kg)  09/26/19 250 lb (113.4 kg)  09/24/19 251 lb (113.9 kg)  06/25/19 227 lb (103 kg)  06/24/19 227 lb (103 kg)  03/12/19 232 lb (105.2 kg)  03/11/19 227 lb (103 kg)  02/18/19 224 lb (101.6 kg)  01/21/19 223 lb (101.2 kg)  01/07/19 227 lb (103 kg)  12/06/18 221 lb (100.2 kg)  11/08/18 222 lb (100.7 kg)  10/04/18 219 lb (99.3 kg)  08/28/18 223 lb (101.2 kg)  08/07/18 222 lb (100.7 kg)  06/14/18 231 lb (104.8 kg)  05/29/18 235 lb (106.6 kg)  05/08/18 241 lb (109.3 kg)  04/24/18 244 lb (110.7 kg)  04/05/18 251 lb (113.9 kg)  02/13/18 253 lb (114.8 kg)  02/01/18 261 lb 11 oz (118.7 kg)  01/30/18 258 lb 2.5 oz (117.1 kg)  01/09/18 261 lb 7.5 oz (118.6 kg)  01/04/18 254 lb 10.1 oz (115.5 kg)  01/02/18 254 lb 13.6 oz (115.6 kg)  12/28/17 258 lb 9.6 oz (117.3 kg)  12/21/17 254 lb 3.1 oz (115.3 kg)  12/14/17 255 lb 1.2 oz (115.7 kg)  12/05/17 256 lb 2.8 oz (116.2 kg)  11/30/17 253 lb 1.4 oz (114.8 kg)  11/28/17 255 lb 8.2 oz (  115.9 kg)  11/23/17 254 lb 12.8 oz (115.6 kg)  11/22/17 257 lb 6.4 oz (116.8 kg)  11/21/17 256 lb 6.3 oz (116.3 kg)  11/14/17 253 lb 15.5 oz (115.2 kg)  11/09/17 253 lb 8.5 oz (115 kg)  10/17/17 250 lb 9.6 oz (113.7 kg)   10/11/17 240 lb (108.9 kg)  08/15/17 249 lb 3.2 oz (113 kg)  07/29/17 256 lb 6.3 oz (116.3 kg)  07/13/17 239 lb (108.4 kg)  06/15/17 239 lb 12.8 oz (108.8 kg)  05/29/17 240 lb 3.2 oz (109 kg)  05/09/17 270 lb 9.6 oz (122.7 kg)  02/22/12 225 lb (102.1 kg)   Physical Exam Constitutional:      Appearance: She is obese.  HENT:     Head: Normocephalic and atraumatic.  Cardiovascular:     Rate and Rhythm: Normal rate and regular rhythm.  Pulmonary:     Breath sounds: No wheezing or rhonchi.     Comments: Dyspneic after doing work in home and answering door, recovered in a few mins, wearing her O2 the whole time today Abdominal:     General: Bowel sounds are normal.  Musculoskeletal:        General: Normal range of motion.     Right lower leg: No edema.     Left lower leg: No edema.  Skin:    General: Skin is warm and dry.  Neurological:     General: No focal deficit present.     Mental Status: She is alert and oriented to person, place, and time.  Psychiatric:        Mood and Affect: Mood normal.     CURRENT PROBLEM LIST:  Patient Active Problem List   Diagnosis Date Noted   Pulmonary emphysema with fibrosis of lung ([place]) 01/31/2022   Sclerosing adenosis of right breast 02/04/2021   Prediabetes 02/01/2021   Pulmonary hypertension, unspecified ([place]) 12/07/2020   Osteopenia 08/05/2020   Adjustment disorder with anxiety 04/29/2020   Sleep disorder 01/10/2020   Chronic allergic rhinitis 01/10/2020   Pernicious anemia 01/10/2020   Chronic prescription benzodiazepine use 01/10/2020   Benign neoplasm of rectum    Dyslipidemia 09/24/2019   Morbid obesity ([place]) 09/24/2019   History of DVT (deep vein thrombosis), with PE 07/25/2019   OSA on CPAP    Crohn disease ([place]) 06/15/2017   ILD (interstitial lung disease) ([place]) 05/29/2017   Chronic respiratory failure with hypoxia ([place]) 05/29/2017   History of polycythemia vera 11/12/2014    PAST MEDICAL HISTORY:  Active  Ambulatory Problems    Diagnosis Date Noted   ILD (interstitial lung disease) ([place]) 05/29/2017   Chronic respiratory failure with hypoxia ([place]) 05/29/2017   History of polycythemia vera 11/12/2014   Crohn disease ([place]) 06/15/2017   OSA on CPAP    History of DVT (deep vein thrombosis), with PE 07/25/2019   Dyslipidemia 09/24/2019   Morbid obesity ([place]) 09/24/2019   Benign neoplasm of rectum    Sleep disorder 01/10/2020   Chronic allergic rhinitis 01/10/2020   Pernicious anemia 01/10/2020   Chronic prescription benzodiazepine use 01/10/2020   Adjustment disorder with anxiety 04/29/2020   Osteopenia 08/05/2020   Pulmonary hypertension, unspecified ([place]) 12/07/2020   Prediabetes 02/01/2021   Sclerosing adenosis of right breast 02/04/2021   Pulmonary emphysema with fibrosis of lung ([place]) 01/31/2022   Resolved Ambulatory Problems    Diagnosis Date Noted   Abnormal stress test    Type 2 diabetes mellitus without complication, without long-term current use of insulin ([place]) 05/28/2019  Past Medical History:  Diagnosis Date   Abnormal nuclear stress test    Anxiety    Aortic atherosclerosis (HCC)    Arthritis    Chronic diastolic CHF (congestive heart failure) (HCC)    Coronary artery calcification seen on CT scan    DVT (deep venous thrombosis) (HCC)    Dyspnea    Grade I diastolic dysfunction    Hyperlipidemia    Polycythemia vera (HCC)    Pulmonary embolism ([place]) 2010   Pulmonary hypertension (HCC)     SOCIAL HX:  Social History   Tobacco Use   Smoking status: Former    Packs/day: 1.00    Years: 45.00    Total pack years: 45.00    Types: Cigarettes    Quit date: 12/26/2008    Years since quitting: 13.8   Smokeless tobacco: Never  Substance Use Topics   Alcohol use: Yes    Comment: occ     ALLERGIES:  Allergies  Allergen Reactions   Victoza [Liraglutide] Shortness Of Breath   Metformin And Related Diarrhea    Increased moodiness   Onion Other (See Comments)     sereve diarrhea, stomach pains (Crohn's flares)   Lasix [Furosemide] Itching    Hypersensitivity - pt can take as needed    Pirfenidone Other (See Comments)    GI side effects   Potassium-Containing Compounds Other (See Comments)    Due to chron's, difficult passing though kidneys not allowing absorption in the body    Prednisone Other (See Comments)    Altered mood   Sulfa Drugs Cross Reactors Other (See Comments)    "feels like bugs crawling on me"      PERTINENT MEDICATIONS:  Outpatient Encounter Medications as of 11/03/2022  Medication Sig   amoxicillin (AMOXIL) 500 MG capsule Take 2,000 mg by mouth See admin instructions. Before dental procedures   aspirin EC 81 MG tablet Take 81 mg by mouth daily.   Calcium Carbonate-Vit D-Min (CALCIUM 1200) 1200-1000 MG-UNIT CHEW Chew 1 tablet by mouth daily.   CANNABIDIOL PO Take 1 Dose by mouth 2 (two) times daily as needed (anxiety/sleep.). CBD Oil   cetirizine (ZYRTEC) 10 MG tablet Take 10 mg by mouth daily as needed (sinus/allergies.).   cetirizine-pseudoephedrine (ZYRTEC-D) 5-120 MG tablet Take 1 tablet by mouth daily as needed (sinus headaches.).   clonazePAM (KLONOPIN) 0.5 MG tablet TAKE 0.5-1 TABLETS (0.25-0.5 MG TOTAL) BY MOUTH DAILY AS NEEDED FOR ANXIETY   cyanocobalamin (VITAMIN B12) 1000 MCG/ML injection Inject 1 mL (1,000 mcg total) into the muscle every 30 (thirty) days. Inject 1 mL (1,000 mcg) into the muscle every 30 days   FLUoxetine (PROZAC) 20 MG capsule Take 1 capsule (20 mg total) by mouth daily.   fluticasone (FLONASE) 50 MCG/ACT nasal spray Place 1-2 sprays into both nostrils daily as needed for allergies or rhinitis.   Fluticasone-Umeclidin-Vilant (TRELEGY ELLIPTA) 100-62.5-25 MCG/ACT AEPB Inhale 1 puff into the lungs daily.   furosemide (LASIX) 20 MG tablet Take 1 tablet (20 mg total) by mouth as needed for fluid.   Glycerin-Polysorbate 80 (REFRESH DRY EYE THERAPY OP) Place 1 drop into both eyes daily.   hydrocortisone  (PROCTOZONE-HC) 2.5 % rectal cream Place 1 application rectally 2 (two) times daily as needed (Crohn's flare).   ibuprofen (ADVIL) 200 MG tablet Take 400-800 mg by mouth every 8 (eight) hours as needed (pain.).   mesalamine (PENTASA) 250 MG CR capsule Take 250-500 mg by mouth 4 (four) times daily as needed (crohn's disease flare  ups).   Multiple Vitamins-Minerals (ICAPS AREDS 2 PO) Take 1 tablet by mouth daily.   prednisoLONE acetate (PRED FORTE) 1 % ophthalmic suspension Place 1 drop into both eyes 2 (two) times daily as needed (Uveitis).   Probiotic Product (PROBIOTIC PO) Take 1 capsule by mouth 2 (two) times a week. As needed   SYRINGE-NEEDLE, DISP, 3 ML (LUER LOCK SAFETY SYRINGES) 25G X 1" 3 ML MISC USE AS DIRECTED   traMADol (ULTRAM) 50 MG tablet Take 1 tablet (50 mg total) by mouth every 6 (six) hours as needed.   traZODone (DESYREL) 50 MG tablet TAKE 0.5-1 TABLETS BY MOUTH AT BEDTIME AS NEEDED FOR SLEEP.   Treprostinil (TYVASO REFILL) 0.6 MG/ML SOLN Inhale 54 mcg into the lungs in the morning, at noon, in the evening, and at bedtime.   No facility-administered encounter medications on file as of 11/03/2022.    Thank you for the opportunity to participate in the care of Ms. Stuckert.  The palliative care team will continue to follow. Please call our office at [phone] if we can be of additional assistance.   Hollace Kinnier, DO  COVID-19 PATIENT SCREENING TOOL Asked and negative response unless otherwise noted:  Have you had symptoms of covid, tested positive or been in contact with someone with symptoms/positive test in the past 5-10 days? no

## 2022-11-07 ENCOUNTER — Encounter: Payer: Self-pay | Admitting: Family Medicine

## 2022-11-19 IMAGING — CT CT CHEST HIGH RESOLUTION
1 of 4 series · 14 of 32 positions shown, 18 images · non-contrast
Comparison: 07/10/2020.

CLINICAL DATA: Interstitial lung disease, former smoker.



[Series 2: chest · axial · 0.68mm/px · z∈[+814,+1112]mm · 14 of 166 slices shown, 18 images]
[im 9/166  mediastinal]
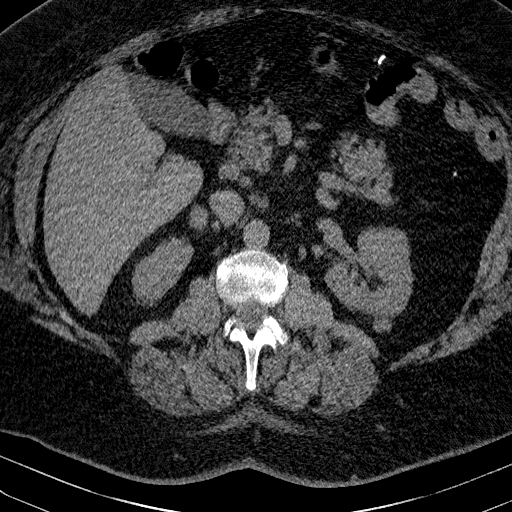
[im 9/166  lung]
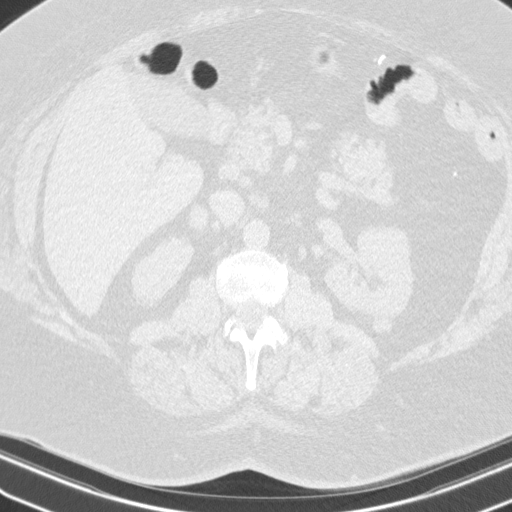
[im 25/166  lung]
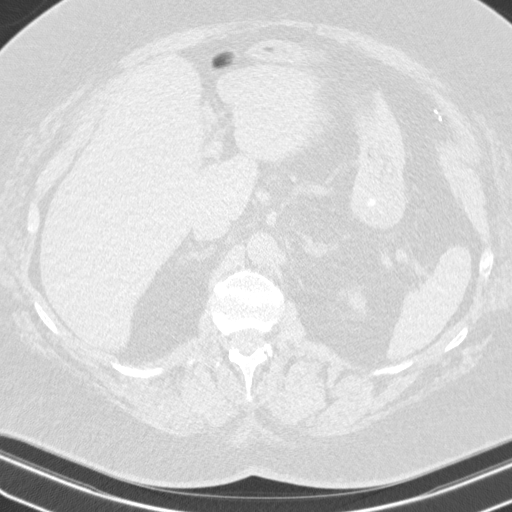
[im 34/166  lung]
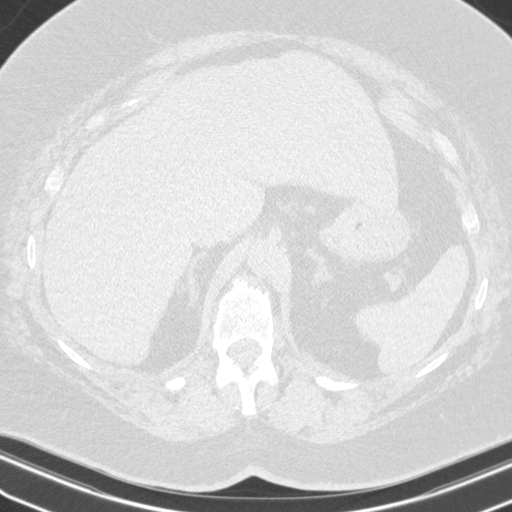
[im 50/166  lung]
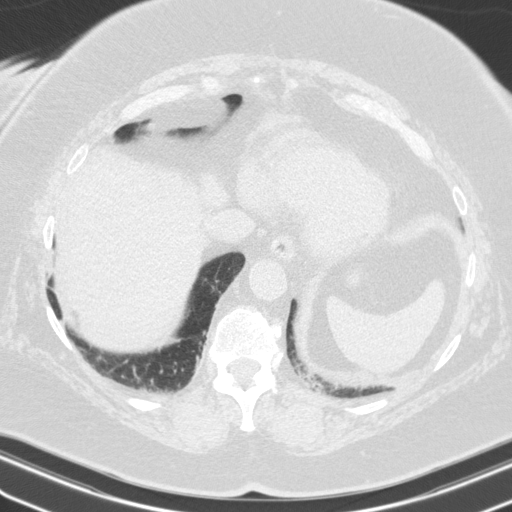
[im 58/166  mediastinal]
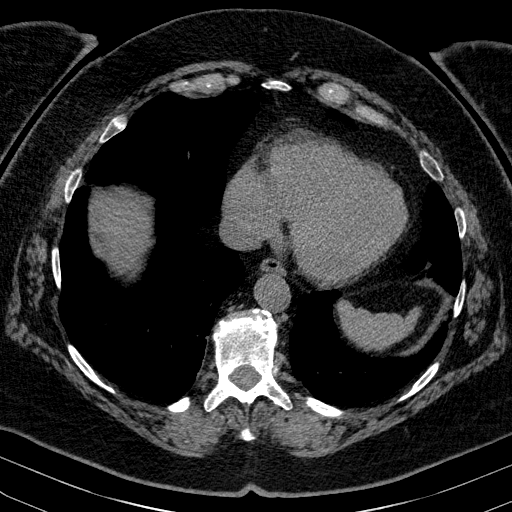
[im 58/166  lung]
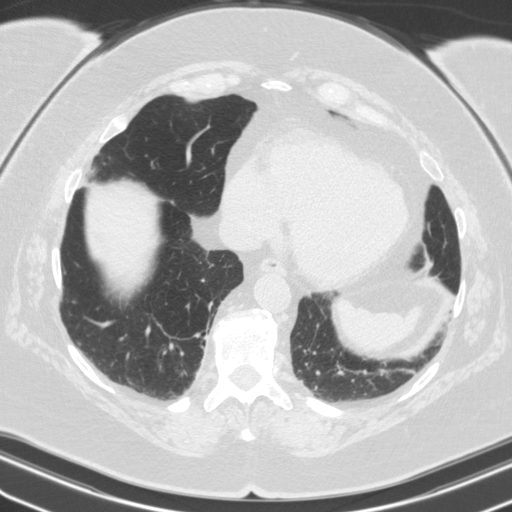
[im 67/166  lung]
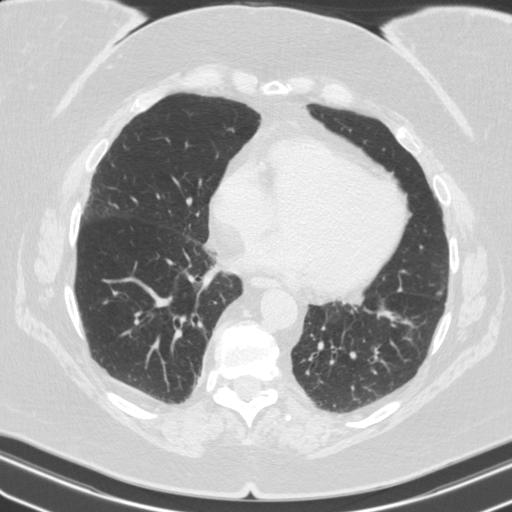
[im 83/166  lung]
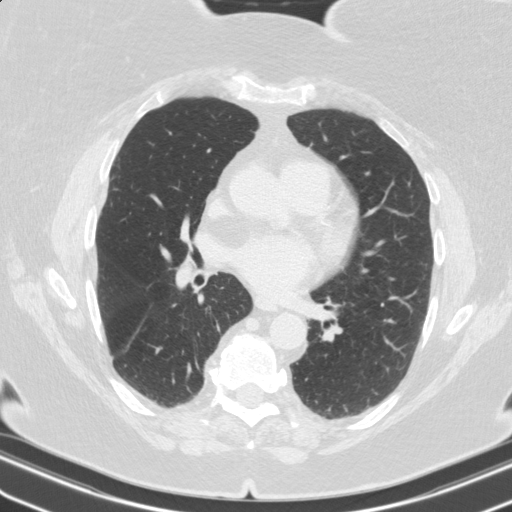
[im 91/166  lung]
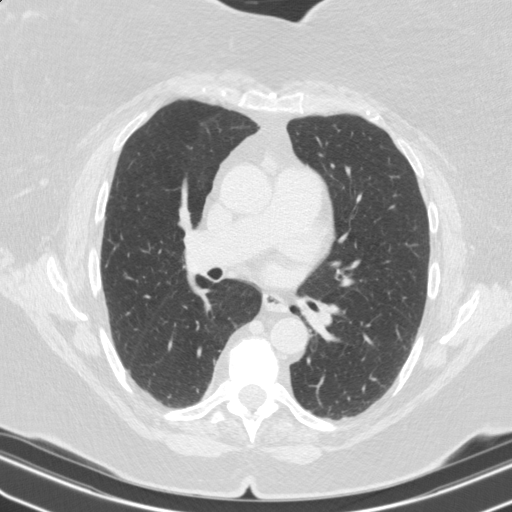
[im 108/166  mediastinal]
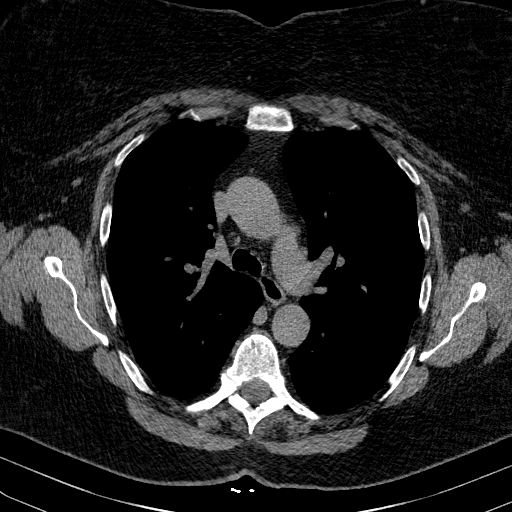
[im 108/166  lung]
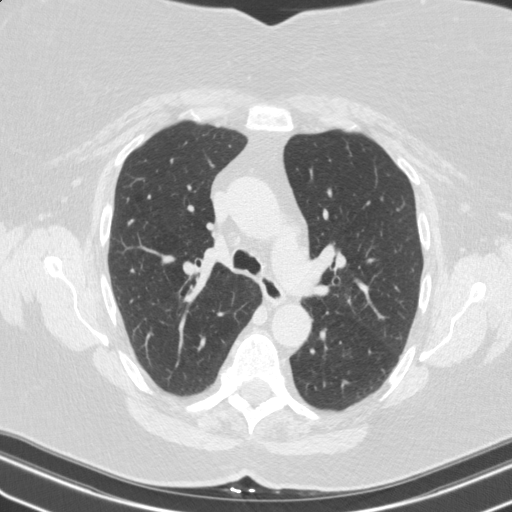
[im 116/166  lung]
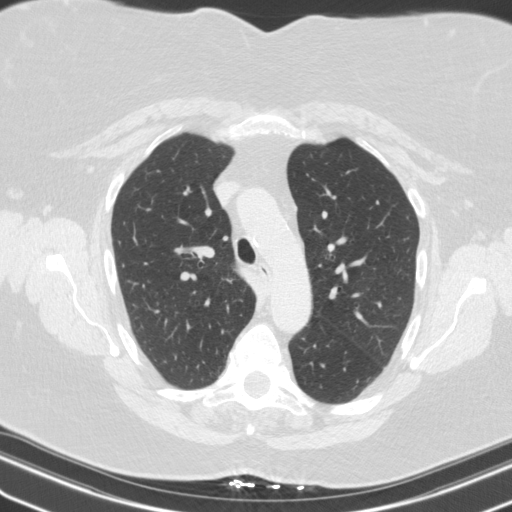
[im 124/166  lung]
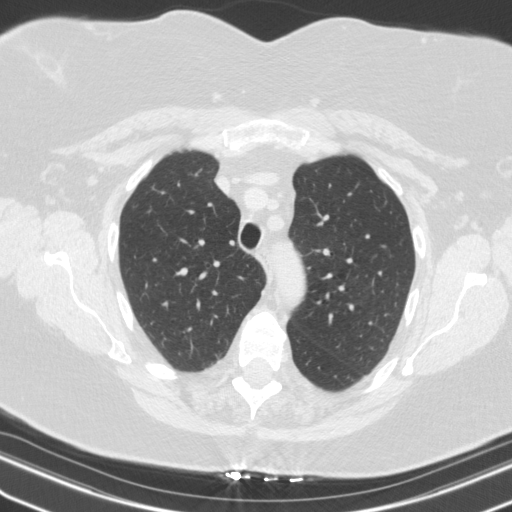
[im 133/166  lung]
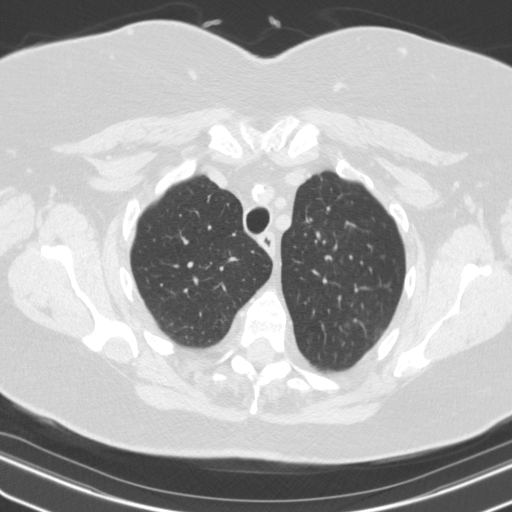
[im 141/166  mediastinal]
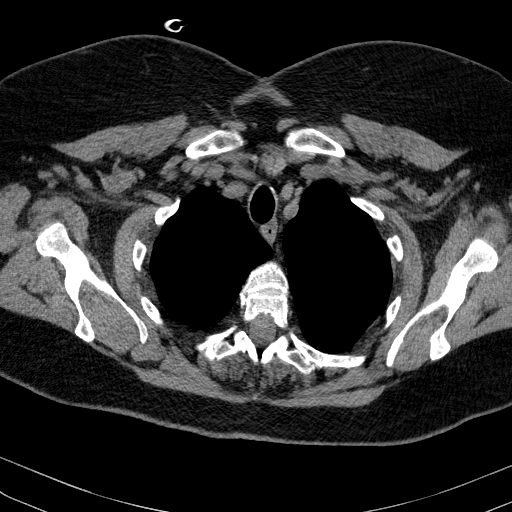
[im 141/166  lung]
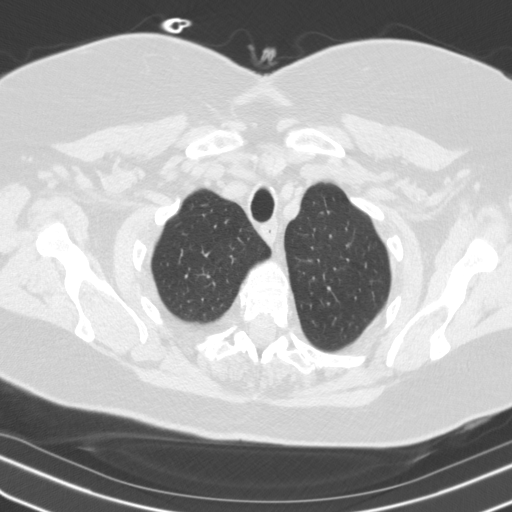
[im 157/166  lung]
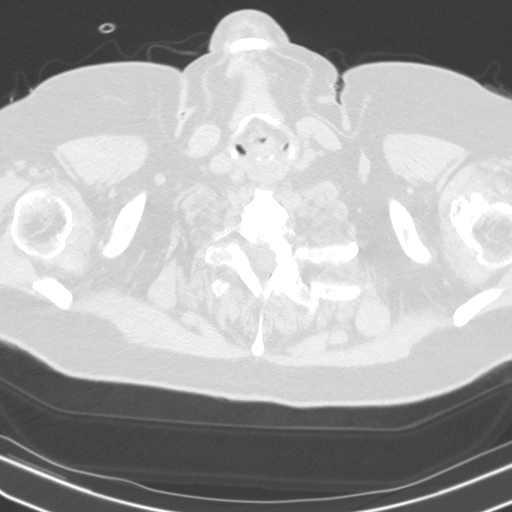

[14 of 32 positions shown; findings below may reference images not displayed]

FINDINGS: Cardiovascular: Atherosclerotic calcification of the aorta, aortic
valve and coronary arteries. Pulmonic trunk is enlarged. Heart size
within normal limits. No pericardial effusion.

Mediastinum/Nodes: No pathologically enlarged mediastinal or
axillary lymph nodes. Hilar regions are difficult to definitively
evaluate without IV contrast but appear grossly unremarkable.
Esophagus is grossly unremarkable.

Lungs/Pleura: Centrilobular emphysema. Negative for subpleural
reticulation, traction bronchiectasis/bronchiolectasis,
ground-glass, architectural distortion or honeycombing. Subpleural
densities and ground-glass seen on supine imaging largely clear on
prone imaging. Mild bibasilar scarring and bronchiectasis. No
pleural fluid. Airway is unremarkable. No air trapping.

Upper Abdomen: Liver margin is slightly irregular. Visualized
portions of the liver, gallbladder, adrenal glands and right kidney
are otherwise unremarkable. 1.4 cm exophytic low-attenuation lesion
off the interpolar left kidney is likely a cyst. Visualized portions
of the spleen, pancreas, stomach and bowel are grossly. Scattered
surgical clips in the abdomen. Upper abdominal lymph nodes are not
enlarged by CT size criteria.

Musculoskeletal: Degenerative changes in the spine. No worrisome
lytic or sclerotic lesions.
IMPRESSION: 1. Favor absence of interstitial lung disease based on prone
imaging.
2. Bibasilar scarring and mild bronchiectasis.
3. Marginal irregularity the liver is indicative of cirrhosis.
4. Aortic atherosclerosis (H9N4N-[id number].N). Coronary artery
calcification.
5.  Emphysema ([id number]-[id number].I).
6. Enlarged pulmonic trunk indicative pulmonary arterial
hypertension.

## 2022-11-25 ENCOUNTER — Encounter: Payer: Self-pay | Admitting: Family Medicine

## 2022-11-25 ENCOUNTER — Ambulatory Visit (INDEPENDENT_AMBULATORY_CARE_PROVIDER_SITE_OTHER): Payer: Medicare Other | Admitting: Family Medicine

## 2022-11-25 VITALS — BP 116/64 | HR 80 | Temp 97.8°F | Ht 64.0 in | Wt 267.0 lb

## 2022-11-25 DIAGNOSIS — Z780 Asymptomatic menopausal state: Secondary | ICD-10-CM

## 2022-11-25 DIAGNOSIS — R7303 Prediabetes: Secondary | ICD-10-CM | POA: Diagnosis not present

## 2022-11-25 DIAGNOSIS — J439 Emphysema, unspecified: Secondary | ICD-10-CM

## 2022-11-25 DIAGNOSIS — J9611 Chronic respiratory failure with hypoxia: Secondary | ICD-10-CM

## 2022-11-25 DIAGNOSIS — K50919 Crohn's disease, unspecified, with unspecified complications: Secondary | ICD-10-CM | POA: Diagnosis not present

## 2022-11-25 DIAGNOSIS — I272 Pulmonary hypertension, unspecified: Secondary | ICD-10-CM

## 2022-11-25 DIAGNOSIS — M8580 Other specified disorders of bone density and structure, unspecified site: Secondary | ICD-10-CM

## 2022-11-25 DIAGNOSIS — J8410 Pulmonary fibrosis, unspecified: Secondary | ICD-10-CM

## 2022-11-25 DIAGNOSIS — G479 Sleep disorder, unspecified: Secondary | ICD-10-CM | POA: Diagnosis not present

## 2022-11-25 DIAGNOSIS — J849 Interstitial pulmonary disease, unspecified: Secondary | ICD-10-CM

## 2022-11-25 DIAGNOSIS — F4322 Adjustment disorder with anxiety: Secondary | ICD-10-CM

## 2022-11-25 LAB — COMPREHENSIVE METABOLIC PANEL
ALT: 17 U/L (ref 0–35)
AST: 16 U/L (ref 0–37)
Albumin: 4.2 g/dL (ref 3.5–5.2)
Alkaline Phosphatase: 79 U/L (ref 39–117)
BUN: 19 mg/dL (ref 6–23)
CO2: 31 mEq/L (ref 19–32)
Calcium: 9.3 mg/dL (ref 8.4–10.5)
Chloride: 101 mEq/L (ref 96–112)
Creatinine, Ser: 0.78 mg/dL (ref 0.40–1.20)
GFR: 75.98 mL/min (ref >60.00)
Glucose, Bld: 137 mg/dL — ABNORMAL HIGH (ref 70–99)
Potassium: 4.2 mEq/L (ref 3.5–5.1)
Sodium: 139 mEq/L (ref 135–145)
Total Bilirubin: 0.6 mg/dL (ref 0.2–1.2)
Total Protein: 7 g/dL (ref 6.0–8.3)

## 2022-11-25 LAB — CBC WITH DIFFERENTIAL/PLATELET
Basophils Absolute: 0.1 10*3/uL (ref 0.0–0.1)
Basophils Relative: 1.5 % (ref 0.0–3.0)
Eosinophils Absolute: 0.1 10*3/uL (ref 0.0–0.7)
Eosinophils Relative: 1.8 % (ref 0.0–5.0)
HCT: 42.5 % (ref 36.0–46.0)
Hemoglobin: 14 g/dL (ref 12.0–15.0)
Lymphocytes Relative: 21.2 % (ref 12.0–46.0)
Lymphs Abs: 1.5 10*3/uL (ref 0.7–4.0)
MCHC: 32.9 g/dL (ref 30.0–36.0)
MCV: 91.5 fl (ref 78.0–100.0)
Monocytes Absolute: 0.6 10*3/uL (ref 0.1–1.0)
Monocytes Relative: 9.3 % (ref 3.0–12.0)
Neutro Abs: 4.6 10*3/uL (ref 1.4–7.7)
Neutrophils Relative %: 66.2 % (ref 43.0–77.0)
Platelets: 318 10*3/uL (ref 150.0–400.0)
RBC: 4.64 Mil/uL (ref 3.87–5.11)
RDW: 14.7 % (ref 11.5–15.5)
WBC: 7 10*3/uL (ref 4.0–10.5)

## 2022-11-25 LAB — LIPID PANEL
Cholesterol: 190 mg/dL (ref 0–200)
HDL: 55.7 mg/dL (ref >39.00)
LDL Cholesterol: 117 mg/dL — ABNORMAL HIGH (ref 0–99)
NonHDL: 134.29
Total CHOL/HDL Ratio: 3
Triglycerides: 85 mg/dL (ref 0.0–149.0)
VLDL: 17 mg/dL (ref 0.0–40.0)

## 2022-11-25 LAB — HEMOGLOBIN A1C: Hgb A1c MFr Bld: 6.8 % — ABNORMAL HIGH (ref 4.6–6.5)

## 2022-11-25 LAB — TSH: TSH: 0.46 u[IU]/mL (ref 0.35–5.50)

## 2022-11-25 MED ORDER — FLUOXETINE HCL 20 MG PO CAPS: 20.0000 mg | ORAL_CAPSULE | Freq: Every day | ORAL | 3 refills | Status: DC

## 2022-11-25 NOTE — Patient Instructions (Signed)
Please return in 12 months for your annual complete physical; please come fasting.   I will release your lab results to you on your MyChart account with further instructions. You may see the results before I do, but when I review them I will send you a message with my report or have my assistant call you if things need to be discussed. Please reply to my message with any questions. Thank you!   If you have any questions or concerns, please don't hesitate to send me a message via MyChart or call the office at [phone]. Thank you for visiting with [name] today! It's our pleasure caring for you.

## 2022-11-27 NOTE — Progress Notes (Signed)
Subjective  Chief Complaint  Patient presents with   Annual Exam    Pt here for Annual exam and is currently fasting      HPI: Patty [name] is a 71 y.o. female who presents to [facility] today for a Female Wellness Visit. She also has the concerns and/or needs as listed above in the chief complaint. These will be addressed in addition to the Health Maintenance Visit.   Wellness Visit: annual visit with health maintenance review and exam without Pap  HM: due mammo, however pt defers. No longer feels screens are warranted.  Chronic disease f/u and/or acute problem visit: (deemed necessary to be done in addition to the wellness visit): Lung disease: reviewed pulmonology notes: had 2 more chest CT scans: not c/w ILD: complicated case. Now oxygen dependent and limited.  Crohn's disease stable on meds Mood is stable on prozac in spite of multiple medical problems.  Prediabetes w/o sxs of hyperglycemia Osteopenia: due for f/u dexa; high risk for osteoporosis.    Assessment  1. ILD (interstitial lung disease) ([place])   2. Crohn's disease with complication, unspecified gastrointestinal tract location ([place])   3. Morbid obesity ([place])   4. Sleep disorder   5. Pulmonary hypertension, unspecified ([place])   6. Adjustment disorder with anxiety   7. Chronic respiratory failure with hypoxia (HCC)   8. Prediabetes   9. Osteopenia, unspecified location   10. Pulmonary emphysema with fibrosis of lung ([place])   11. Asymptomatic menopausal state      Plan  Female Wellness Visit: Age appropriate Health Maintenance and Prevention measures were discussed with patient. Included topics are cancer screening recommendations, ways to keep healthy (see AVS) including dietary and exercise recommendations, regular eye and dental care, use of seat belts, and avoidance of moderate alcohol use and tobacco use.  BMI: discussed patient's BMI and encouraged positive lifestyle  modifications to help get to or maintain a target BMI. HM needs and immunizations were addressed and ordered. See below for orders. See HM and immunization section for updates. Routine labs and screening tests ordered including cmp, cbc and lipids where appropriate. Discussed recommendations regarding Vit D and calcium supplementation (see AVS)  Chronic disease management visit and/or acute problem visit: Lung disease: pulm is still working on true dx: now oxygen dependent. Will follow along Crohn's disease is stable Refilled prozac; mood is controlled.  Will get dexa and see if bone density has worsened. Likely not a good biphosphonate candidate due to crohn's   I spent a total of 47 minutes for this patient encounter. Time spent included preparation, face-to-face counseling with the patient and coordination of care, review of chart and records, and documentation of the encounter.  Follow up: 6 mo for recheck  Orders Placed This Encounter  Procedures   DG Bone Density   CBC with Differential/Platelet   Comprehensive metabolic panel   Hemoglobin A1c   Lipid panel   TSH   Meds ordered this encounter  Medications   FLUoxetine (PROZAC) 20 MG capsule    Sig: Take 1 capsule (20 mg total) by mouth daily.    Dispense:  90 capsule    Refill:  3      Body mass index is 45.83 kg/m. Wt Readings from Last 3 Encounters:  11/25/22 267 lb (121.1 kg)  09/22/22 272 lb (123.4 kg)  09/15/22 269 lb (122 kg)     Patient Active Problem List   Diagnosis Date Noted   Prediabetes  02/01/2021    Priority: High   Pulmonary hypertension, unspecified ([place]) 12/07/2020    Priority: High   Sleep disorder 01/10/2020    Priority: High    Uses klonopin for sleep since 2014; sleep walking. And anxiety    Pernicious anemia 01/10/2020    Priority: High   Chronic prescription benzodiazepine use 01/10/2020    Priority: High   Morbid obesity ([place]) 09/24/2019    Priority: High   History of DVT (deep  vein thrombosis), with PE 07/25/2019    Priority: High   OSA on CPAP     Priority: High    HST 06/01/17- AHI 33.8/ hr, desaturation to 69%, body weight 240 lbs   CPAP to 8    Crohn disease ([place]) 06/15/2017    Priority: High   ILD (interstitial lung disease) ([place]) 05/29/2017    Priority: High   Osteopenia 08/05/2020    Priority: Medium     H/o pred use Dexa 07/2020: lowest T left femur -2.2; rec cal vit d and exercise. Recheck 2023    Adjustment disorder with anxiety 04/29/2020    Priority: Medium    Dyslipidemia 09/24/2019    Priority: Medium    History of polycythemia vera 11/12/2014    Priority: Medium    Chronic allergic rhinitis 01/10/2020    Priority: Low   Pulmonary emphysema with fibrosis of lung ([place]) 01/31/2022   Sclerosing adenosis of right breast 02/04/2021   Benign neoplasm of rectum    Chronic respiratory failure with hypoxia ([place]) 05/29/2017   Health Maintenance  Topic Date Due   Zoster Vaccines- Shingrix (1 of 2) Never done   MAMMOGRAM  08/04/2021   DEXA SCAN  08/04/2022   COVID-19 Vaccine (7 - 2023-24 season) 12/11/2022 (Originally 11/14/2022)   Lung Cancer Screening  03/31/2023   COLONOSCOPY (Pts 45-24yr Insurance coverage will need to be confirmed)  09/29/2029   Pneumonia Vaccine 72 Years old  Completed   INFLUENZA VACCINE  Completed   Hepatitis C Screening  Completed   HPV VACCINES  Aged Out   DTaP/Tdap/Td  Discontinued   Immunization History  Administered Date(s) Administered   Fluad Quad(high Dose 65+) 10/07/2019, 09/09/2020, 11/27/2021, 09/19/2022   Influenza, High Dose Seasonal PF 08/28/2018   Influenza-Unspecified 11/23/2017   PFIZER Comirnaty(Gray Top)Covid-19 Tri-Sucrose Vaccine 09/19/2022   PFIZER(Purple Top)SARS-COV-2 Vaccination 02/16/2020, 03/17/2020, 12/14/2020, 05/14/2021, 10/27/2021   Pfizer Covid-19 Vaccine Bivalent Booster 195yr& up 10/27/2021   Pneumococcal Conjugate-13 02/13/2018   Pneumococcal Polysaccharide-23 03/12/2019    Respiratory Syncytial Virus Vaccine,Recomb Aduvanted(Arexvy) 09/24/2022   We updated and reviewed the patient's past history in detail and it is documented below. Allergies: Patient is allergic to victoza [liraglutide], metformin and related, onion, lasix [furosemide], pirfenidone, potassium-containing compounds, prednisone, and sulfa drugs cross reactors. Past Medical History Patient  has a past medical history of Abnormal nuclear stress test, Anxiety, Aortic atherosclerosis ([place] Arthritis, Chronic diastolic CHF (congestive heart failure) ([facility] Coronary artery calcification seen on CT scan, Crohn disease ([facility] DVT (deep venous thrombosis) ([facility] Dyspnea, Grade I diastolic dysfunction, Hyperlipidemia, ILD (interstitial lung disease) ([facility] OSA on CPAP, Pernicious anemia, Polycythemia vera ([facility] Pulmonary embolism ([facility](2010), Pulmonary hypertension ([facility] [place] and Sleep disorder (01/10/2020). Past Surgical History Patient  has a past surgical history that includes Hip Arthroplasty (Right); Colon resection (1993); LEFT HEART CATH AND CORONARY ANGIOGRAPHY (N/A, 07/28/2017); Colonoscopy with propofol (N/A, 09/30/2019); biopsy (09/30/2019); polypectomy (09/30/2019); RIGHT HEART CATH (N/A, 12/07/2020); Cardiac catheterization;  RIGHT BREAST LUMPECTOMY WITH RADIOACTIVE SEED LOCALIZATION (Right Breast) (02/04/2021); and Breast  lumpectomy with radioactive seed localization (Right, 02/04/2021). Family History: Patient family history includes Anxiety disorder in her father; COPD in her brother; Diabetes Mellitus II in her brother and father; Glaucoma in her brother and mother; Heart disease in her brother; High Cholesterol in her mother; High blood pressure in her mother; Non-Hodgkin's lymphoma in her brother; Sleep apnea in her father and mother. Social History:  Patient  reports that she quit smoking about 13 years ago. Her smoking use included cigarettes. She has a 45.00 pack-year smoking history. She has never used  smokeless tobacco. She reports current alcohol use. She reports that she does not use drugs.  Review of Systems: Constitutional: negative for fever or malaise Ophthalmic: negative for photophobia, double vision or loss of vision Cardiovascular: negative for chest pain, dyspnea on exertion, or new LE swelling Respiratory: negative for SOB or persistent cough Gastrointestinal: negative for abdominal pain, change in bowel habits or melena Genitourinary: negative for dysuria or gross hematuria, no abnormal uterine bleeding or disharge Musculoskeletal: negative for new gait disturbance or muscular weakness Integumentary: negative for new or persistent rashes, no breast lumps Neurological: negative for TIA or stroke symptoms Psychiatric: negative for SI or delusions Allergic/Immunologic: negative for hives  Patient Care Team    Relationship Specialty Notifications Start End  [name], MD PCP - General Family Medicine  07/13/22   Josue Hector, MD PCP - Cardiology Cardiology  11/30/21   Jerene Bears, MD Consulting Physician Gastroenterology  01/10/20   Shamleffer, Melanie Crazier, MD Consulting Physician Endocrinology  01/10/20   Brand Males, MD Consulting Physician Pulmonary Disease  01/10/20     Objective  Vitals: BP 116/64   Pulse 80   Temp 97.8 F (36.6 C)   Ht 5' 4" (1.626 m)   Wt 267 lb (121.1 kg)   SpO2 98%   BMI 45.83 kg/m  General:  Well developed, well nourished, no acute distress wearing oxygen Psych:  Alert and orientedx3,normal mood and affect HEENT:  Normocephalic, atraumatic, non-icteric sclera,  supple neck without adenopathy, mass or thyromegaly Cardiovascular:  Normal S1, S2, RRR without gallop, rub or murmur Respiratory:  distant breath sounds bilaterally, CTAB with normal respiratory effort Gastrointestinal: normal bowel sounds, soft, non-tender, no noted masses. No HSM MSK: no deformities, contusions. Joints are without erythema or swelling.  Skin:   Warm, no rashes or suspicious lesions noted Neurologic:    Mental status is normal. CN 2-11 are normal. Gross motor and sensory exams are normal. Normal gait. No tremor   Commons side effects, risks, benefits, and alternatives for medications and treatment plan prescribed today were discussed, and the patient expressed understanding of the given instructions. Patient is instructed to call or message via MyChart if he/she has any questions or concerns regarding our treatment plan. No barriers to understanding were identified. We discussed Red Flag symptoms and signs in detail. Patient expressed understanding regarding what to do in case of urgent or emergency type symptoms.  Medication list was reconciled, printed and provided to the patient in AVS. Patient instructions and summary information was reviewed with the patient as documented in the AVS. This note was prepared with assistance of Dragon voice recognition software. Occasional wrong-word or sound-a-like substitutions may have occurred due to the inherent limitations of voice recognition software

## 2022-11-29 ENCOUNTER — Encounter: Payer: Self-pay | Admitting: Family Medicine

## 2022-12-02 ENCOUNTER — Other Ambulatory Visit: Payer: Self-pay | Admitting: Family Medicine

## 2022-12-02 DIAGNOSIS — Z780 Asymptomatic menopausal state: Secondary | ICD-10-CM

## 2022-12-10 ENCOUNTER — Encounter: Payer: Self-pay | Admitting: Internal Medicine

## 2022-12-12 NOTE — Telephone Encounter (Signed)
  She is good for through atleast 2025 march for pneumococcal vaccine. . After that is shared decision making - [email or web address]  Immunization History  Administered Date(s) Administered   Fluad Quad(high Dose 65+) 10/07/2019, 09/09/2020, 11/27/2021, 09/19/2022   Influenza, High Dose Seasonal PF 08/28/2018   Influenza-Unspecified 11/23/2017   PFIZER Comirnaty(Gray Top)Covid-19 Tri-Sucrose Vaccine 09/19/2022   PFIZER(Purple Top)SARS-COV-2 Vaccination 02/16/2020, 03/17/2020, 12/14/2020, 05/14/2021, 10/27/2021   Pfizer Covid-19 Vaccine Bivalent Booster 76yr & up 10/27/2021   Pneumococcal Conjugate-13 02/13/2018   Pneumococcal Polysaccharide-23 03/12/2019   Respiratory Syncytial Virus Vaccine,Recomb Aduvanted(Arexvy) 09/24/2022

## 2022-12-22 ENCOUNTER — Other Ambulatory Visit: Payer: Self-pay | Admitting: *Deleted

## 2022-12-22 DIAGNOSIS — J849 Interstitial pulmonary disease, unspecified: Secondary | ICD-10-CM

## 2022-12-23 ENCOUNTER — Ambulatory Visit (INDEPENDENT_AMBULATORY_CARE_PROVIDER_SITE_OTHER): Payer: Medicare Other | Admitting: Internal Medicine

## 2022-12-23 DIAGNOSIS — J849 Interstitial pulmonary disease, unspecified: Secondary | ICD-10-CM

## 2022-12-23 LAB — PULMONARY FUNCTION TEST
DL/VA % pred: 67 %
DL/VA: 2.78 ml/min/mmHg/L
DLCO cor % pred: 62 %
DLCO cor: 11.85 ml/min/mmHg
DLCO unc % pred: 64 %
DLCO unc: 12.06 ml/min/mmHg
FEF 25-75 Post: 0.95 L/sec
FEF 25-75 Pre: 0.81 L/sec
FEF2575-%Change-Post: 17 %
FEF2575-%Pred-Post: 53 %
FEF2575-%Pred-Pre: 45 %
FEV1-%Change-Post: 6 %
FEV1-%Pred-Post: 70 %
FEV1-%Pred-Pre: 65 %
FEV1-Post: 1.5 L
FEV1-Pre: 1.41 L
FEV1FVC-%Change-Post: 0 %
FEV1FVC-%Pred-Pre: 81 %
FEV6-%Change-Post: 6 %
FEV6-%Pred-Post: 89 %
FEV6-%Pred-Pre: 83 %
FEV6-Post: 2.41 L
FEV6-Pre: 2.26 L
FEV6FVC-%Pred-Post: 104 %
FEV6FVC-%Pred-Pre: 104 %
FVC-%Change-Post: 5 %
FVC-%Pred-Post: 85 %
FVC-%Pred-Pre: 80 %
FVC-Post: 2.41 L
FVC-Pre: 2.28 L
Post FEV1/FVC ratio: 62 %
Post FEV6/FVC ratio: 100 %
Pre FEV1/FVC ratio: 62 %
Pre FEV6/FVC Ratio: 100 %
RV % pred: 117 %
RV: 2.57 L
TLC % pred: 102 %
TLC: 5.08 L

## 2022-12-23 NOTE — Progress Notes (Signed)
Full PFT performed today.

## 2022-12-23 NOTE — Patient Instructions (Signed)
Full PFT performed today.

## 2022-12-27 ENCOUNTER — Ambulatory Visit: Payer: Medicare Other | Admitting: Internal Medicine

## 2022-12-28 ENCOUNTER — Ambulatory Visit (INDEPENDENT_AMBULATORY_CARE_PROVIDER_SITE_OTHER): Payer: Medicare Other | Admitting: Podiatry

## 2022-12-28 DIAGNOSIS — M216X2 Other acquired deformities of left foot: Secondary | ICD-10-CM

## 2022-12-28 DIAGNOSIS — Z01818 Encounter for other preprocedural examination: Secondary | ICD-10-CM | POA: Diagnosis not present

## 2022-12-28 NOTE — Progress Notes (Signed)
Subjective:  Patient ID: Patty [name], female    DOB: 08-Jul-1951,  MRN: [id number]  Chief Complaint  Patient presents with   Callouses    72 y.o. female presents with the above complaint.  Patient presents for follow-up of left submetatarsal 5 plantarflexed metatarsal.  She states it is still painful has not gotten better she would like to discuss surgical options as she has failed conservative options.  Review of Systems: Negative except as noted in the HPI. Denies N/V/F/Ch.  Past Medical History:  Diagnosis Date   Abnormal nuclear stress test    a. 07/2017: cath with no CAD   Anxiety    Aortic atherosclerosis (HCC)    Arthritis    Chronic diastolic CHF (congestive heart failure) (HCC)    Coronary artery calcification seen on CT scan    Crohn disease (HCC)    DVT (deep venous thrombosis) (HCC)    Dyspnea    Grade I diastolic dysfunction    Hyperlipidemia    ILD (interstitial lung disease) (HCC)    OSA on CPAP    CPAP    AND O2   Pernicious anemia    Polycythemia vera ([place])    Pulmonary embolism ([place]) 2010   s/p hip surgery , reports this was never truly confirmed    Pulmonary hypertension ([place])    Sleep disorder 01/10/2020   Uses klonopin for sleep since 2014; sleep walking. And anxiety    Current Outpatient Medications:    albuterol (VENTOLIN HFA) 108 (90 Base) MCG/ACT inhaler, Inhale 2 puffs into the lungs every 6 (six) hours as needed for wheezing or shortness of breath., Disp: 8 g, Rfl: 6   amoxicillin (AMOXIL) 500 MG capsule, Take 4 capsules (2,000 mg total) by mouth once as needed for up to 1 dose. Before dental procedures, Disp: 20 capsule, Rfl: 1   aspirin EC 81 MG tablet, Take 81 mg by mouth daily., Disp: , Rfl:    Calcium Carbonate-Vit D-Min (CALCIUM 1200) 1200-1000 MG-UNIT CHEW, Chew 1 tablet by mouth daily., Disp: , Rfl:    CANNABIDIOL PO, Take 1 Dose by mouth 2 (two) times daily as needed (anxiety/sleep.). CBD Oil, Disp: , Rfl:    cetirizine (ZYRTEC) 10  MG tablet, Take 10 mg by mouth daily as needed (sinus/allergies.)., Disp: , Rfl:    cetirizine-pseudoephedrine (ZYRTEC-D) 5-120 MG tablet, Take 1 tablet by mouth daily as needed (sinus headaches.)., Disp: , Rfl:    clonazePAM (KLONOPIN) 0.5 MG tablet, Take 0.5-1 tablets (0.25-0.5 mg total) by mouth daily as needed for anxiety., Disp: 90 tablet, Rfl: 1   cyanocobalamin (VITAMIN B12) 1000 MCG/ML injection, Inject 1 mL (1,000 mcg total) into the muscle every 30 (thirty) days., Disp: 10 mL, Rfl: 1   FLUoxetine (PROZAC) 20 MG capsule, Take 1 capsule (20 mg total) by mouth daily., Disp: 90 capsule, Rfl: 3   fluticasone (FLONASE) 50 MCG/ACT nasal spray, Place 1-2 sprays into both nostrils daily as needed for allergies or rhinitis., Disp: , Rfl:    Fluticasone-Umeclidin-Vilant (TRELEGY ELLIPTA) 100-62.5-25 MCG/ACT AEPB, Inhale 1 puff into the lungs daily. (Patient not taking: Reported on 12/29/2022), Disp: 14 each, Rfl: 0   furosemide (LASIX) 20 MG tablet, Take 1 tablet (20 mg total) by mouth as needed for fluid., Disp: 90 tablet, Rfl: 0   Glycerin-Polysorbate 80 (REFRESH DRY EYE THERAPY OP), Place 1 drop into both eyes daily., Disp: , Rfl:    hydrocortisone (PROCTOZONE-HC) 2.5 % rectal cream, Place 1 application rectally 2 (two) times daily  as needed (Crohn's flare)., Disp: 28 g, Rfl: 2   ibuprofen (ADVIL) 200 MG tablet, Take 400-800 mg by mouth every 8 (eight) hours as needed (pain.)., Disp: , Rfl:    mesalamine (PENTASA) 250 MG CR capsule, Take 250-500 mg by mouth 4 (four) times daily as needed (crohn's disease flare ups)., Disp: , Rfl:    Multiple Vitamins-Minerals (ICAPS AREDS 2 PO), Take 1 tablet by mouth daily., Disp: , Rfl:    prednisoLONE acetate (PRED FORTE) 1 % ophthalmic suspension, Place 1 drop into both eyes 2 (two) times daily as needed (Uveitis)., Disp: , Rfl:    Probiotic Product (PROBIOTIC PO), Take 1 capsule by mouth 2 (two) times a week. As needed, Disp: , Rfl:    SYRINGE-NEEDLE, DISP, 3 ML  (LUER LOCK SAFETY SYRINGES) 25G X 1" 3 ML MISC, USE AS DIRECTED, Disp: 100 each, Rfl: 5   traMADol (ULTRAM) 50 MG tablet, Take 1 tablet (50 mg total) by mouth every 6 (six) hours as needed., Disp: 20 tablet, Rfl: 0   traZODone (DESYREL) 50 MG tablet, TAKE 0.5-1 TABLETS BY MOUTH AT BEDTIME AS NEEDED FOR SLEEP., Disp: 90 tablet, Rfl: 3   Treprostinil (TYVASO REFILL) 0.6 MG/ML SOLN, Inhale 54 mcg into the lungs in the morning, at noon, in the evening, and at bedtime., Disp: 87 mL, Rfl: 11  Social History   Tobacco Use  Smoking Status Former   Packs/day: 1.00   Years: 45.00   Total pack years: 45.00   Types: Cigarettes   Quit date: 12/26/2008   Years since quitting: 14.0  Smokeless Tobacco Never    Allergies  Allergen Reactions   Tizanidine Hcl Shortness Of Breath   Victoza [Liraglutide] Shortness Of Breath   Metformin And Related Diarrhea    Increased moodiness   Onion Other (See Comments)    sereve diarrhea, stomach pains (Crohn's flares)   Lasix [Furosemide] Itching    Hypersensitivity - pt can take as needed    Pirfenidone Other (See Comments)    GI side effects   Potassium-Containing Compounds Other (See Comments)    Due to chron's, difficult passing though kidneys not allowing absorption in the body    Prednisone Other (See Comments)    Altered mood   Sulfa Drugs Cross Reactors Other (See Comments)    "feels like bugs crawling on me"   Trelegy Ellipta [Fluticasone-Umeclidin-Vilant] Palpitations   Objective:  There were no vitals filed for this visit. There is no height or weight on file to calculate BMI. Constitutional Well developed. Well nourished.  Vascular Dorsalis pedis pulses palpable bilaterally. Posterior tibial pulses palpable bilaterally. Capillary refill normal to all digits.  No cyanosis or clubbing noted. Pedal hair growth normal.  Neurologic Normal speech. Oriented to person, place, and time. Epicritic sensation to light touch grossly present  bilaterally.  Dermatologic Hyperkeratotic lesion with central nucleated core noted.  Pain on palpation to the lesion.  Submetatarsal 5 lesion plantarflexed metatarsal noted pain on palpation to the plantar flexed metatarsal  Orthopedic: Normal joint ROM without pain or crepitus bilaterally. No visible deformities. No bony tenderness.   Radiographs: None Assessment:   1. Plantar flexed metatarsal bone of left foot   2. Encounter for preoperative examination for general surgical procedure     Plan:  Patient was evaluated and treated and all questions answered.  Left submetatarsal 5 porokeratosis with underlying plantarflexed fifth metatarsal -All questions and concerns were discussed with the patient in extensive detail.  Clinically given that she has failed  all conservative care she will benefit from surgical floating osteotomy of the fifth metatarsal.  I discussed my preoperative intra postop plan with the patient in extensive detail she states understanding like to proceed with that. -Informed surgical risk consent was reviewed and read aloud to the patient.  I reviewed the films.  I have discussed my findings with the patient in great detail.  I have discussed all risks including but not limited to infection, stiffness, scarring, limp, disability, deformity, damage to blood vessels and nerves, numbness, poor healing, need for braces, arthritis, chronic pain, amputation, death.  All benefits and realistic expectations discussed in great detail.  I have made no promises as to the outcome.  I have provided realistic expectations.  I have offered the patient a 2nd opinion, which they have declined and assured me they preferred to proceed despite the risks   No follow-ups on file.

## 2022-12-29 ENCOUNTER — Encounter: Payer: Self-pay | Admitting: Internal Medicine

## 2022-12-29 ENCOUNTER — Ambulatory Visit (INDEPENDENT_AMBULATORY_CARE_PROVIDER_SITE_OTHER): Payer: Medicare Other | Admitting: Internal Medicine

## 2022-12-29 VITALS — BP 122/72 | HR 91 | Temp 98.9°F | Ht 64.0 in | Wt 265.2 lb

## 2022-12-29 DIAGNOSIS — I2723 Pulmonary hypertension due to lung diseases and hypoxia: Secondary | ICD-10-CM

## 2022-12-29 DIAGNOSIS — J9611 Chronic respiratory failure with hypoxia: Secondary | ICD-10-CM

## 2022-12-29 DIAGNOSIS — J849 Interstitial pulmonary disease, unspecified: Secondary | ICD-10-CM | POA: Diagnosis not present

## 2022-12-29 MED ORDER — ALBUTEROL SULFATE HFA 108 (90 BASE) MCG/ACT IN AERS: 2.0000 | INHALATION_SPRAY | Freq: Four times a day (QID) | RESPIRATORY_TRACT | 6 refills | Status: DC | PRN

## 2022-12-29 NOTE — Progress Notes (Signed)
_0  ID: Patty [name], female    DOB: Aug 13, 1951, 72 y.o.   MRN: [id number]  Chief Complaint  Patient presents with   Follow-up    dyspnea     Referring provider: No ref. provider found  HPI: 72 year old female former smoker seen for pulmonary consulokay guarded because then maybe they can meet him right now for the registryt 05/09/2017 for progressive dyspnea since 2004.[name] Patient has polycythemia vera followed by hematology and [place] She has Crohn's disease    IOV  05/09/2017  Chief Complaint  Patient presents with   Pulmonary Consult    Pt referred for SOB with activity x years. Pt states over the last year the DOE has worsened. Pt denies cough and CP/tightness and f/c/s.     72 year old obese female. In 2004 Started noticing insidious onset of shortness of breath. In 2005 more [place] from [place] and probably gain over 60 pounds of weight. She has extensive workup at that time according to her history. She was then diagnosed with polycythemia by Dr. [name] at [facility]. She does not recollect any phlebotomies for this. However in the last 4-5 years she's had worsening shortness of breath. Noticeable with exertion particularly in the gym and doing standing exercises but not so much with sitting exercises. Relieved by rest. When she does some yard work she notices some associated wheezing as well.  Walking desaturation test in the office she did desaturate to 88% and did get tachycardic. She says that a chest x-ray done by primary care physicians interstitial pulmonary fibrosis. I personally visualized the chest x-ray done 04/03/2017: There is some interstitial markings but it is obscured by her obesity. I'm not so certain that is definite ILD. There is no lab work in the record. There is no PFT a CT scan available for visualization     05/29/2017 Follow up : Dyspnea  Patient returns for a two-week follow-up. Patient was seen for pulmonary consult  05/09/2017 for progressive dyspnea since 2004. Patient was set up for a high resolution CT chest that showed a very mild basilar predominant subpleural reticulation and bronchiectasis which could be due to nonspecific interstitial pneumonitis.. CT did have incidental findings of enlarged pulmonary arteries and aortic and coronary calcification. Patient had pulmonary function test on May 23 that showed an FEV1 at 77%, ratio 73, FVC 81, no significant bronchodilator response, total lung capacity 100%, DLCO 52%. Overnight oximetry test did show significant desaturations. We discussed beginning oxygen at bedtime.  Patient denies significant cough. Says that she get short of breath with walking. Has daytime fatigue and low energy..  Interstitial lung disease patient questionnaire was completed as follows Patient has no previous use of Macrodantin, amiodarone, methotrexate. She has been treated with prednisone for her Crohn's in the past. Patient has had extensive travel with brief visits to multiple state and country's.. [place]. Did live in a apartment that she did notice mold for 4 years. She does have Crohn's disease and was on Mesamaline for many years. Patient says she has had a humidifier and hot tub and previous house. She has no indoor birds. She does have a dog. Patient says she was told that she had a blood clot in the past but was never found.   OV 08/15/2017  Chief Complaint  Patient presents with   Follow-up    Pt still gets occ. SOB. Other than that, pt states that she has been  doing good. Denies any cough or CP.     Follow-up multifactorial dyspnea with interstitial lung disease concern.   Regarding interstitial lung disease concern: Mid May 2018 she had high resolution CT chest that showed interstitial lung disease very mild but unclear if possible UIP pattern. Autoimmune test was negative. Isolated reduction in diffusion capacity 50%. [employer] of chest  physicians questionnaire shows previous Crohn's disease and also mold exposure previously. Overall she feels stable  Regarding dyspnea: She underwent echocardiogram in June 2018 showed grade 1 diastolic dysfunction. Had abnormal cardiac stress test July 2018 followed by cardiac cath early August 2018 that showed normal coronary angiogram but elevated left ventricular end-diastolic pressure. Dietary therapy has been recommended but she is unaware of these results.  Overall she is here to discuss these results.  IMPRESSION: 1. Suspect very mild basilar predominant subpleural reticulation and bronchiolectasis, which may be due to nonspecific interstitial pneumonitis. 2. Aortic atherosclerosis (ICD10-170.0). Coronary artery calcification. 3. Enlarged pulmonary arteries, indicative of pulmonary arterial hypertension.     Electronically Signed   By: Lorin Picket M.D.   On: 05/13/2017 07:33    OV 10/17/2017  Chief Complaint  Patient presents with   Follow-up    Pt states that she has had good days and bad days since last visit. States that when she was in [place] for a month, breathing was better; still became SOB but not as bad as in [place]. Pt's SOB is mainly on exertion. Denies any cough or CP.    Follow-up dyspnea that is multifactorial due to obesity, physical deconditioning and diastolic dysfunction Follow-up mild interstitial lung disease not otherwise specified  Last visit I started her on Lasix. I was only supposed to see herin 6 months or so. However she's had problems tolerating Lasix and potassium. It fluctuates between palpitations and hypertension. She feels the palpitations might be related to potassium depletion after taking Lasix. But she also tells me that the Lasix does help her dyspnea. At this point in time her condition is to see cardiologist. Off note she did send the email message asking these questions on 09/28/2017 made a reply that she tells me that she never got  the reply.   OV 02/13/2018  Chief Complaint  Patient presents with   Follow-up    PFT done 02/05/18.  Pt states she has been doing good. Was sick in january but states she is doing better.  DME: [facility] 4L pulse    Follow-up dyspnea that is multifactorial due to obesity, physical deconditioning and diastolic dysfunction Follow-up mild interstitial lung disease not otherwise specified v concern   72 year old female with concern for interstitial lung disease.  Since her last visit she continues to do well.  She is attending pulmonary rehabilitation.  She feels better and less short of breath.  She uses oxygen with exertion at rehab saying she needs it.  She has upcoming travel in summer to [place] and wanted some flight information oxygen form filled out.  She is wondering if mesalamine that she took several years ago was the cause of interstitial lung disease or her Crohn's disease.  But overall she is stable.  Pulmonary function test shows stability and FVC since last 1 year with some improvement in DLCO.  High-resolution CT scan of the chest interpreted by thoracic radiology report suspected ILD.   OV 08/28/2018  Chief Complaint  Patient presents with   Follow-up    PFT performed today.  Pt states she has had both good  and bad days since last visit. Pt denies any complaints of cough, SOB, or CP but states she is having some problems with congestion since she has been back from vacation. Pt has been having problems with getting O2 supplies taken care of with DME.    Follow-up dyspnea that is multifactorial due to obesity, physical deconditioning and diastolic dysfunction Follow-up concern for  mild interstitial lung disease not otherwise specified  Patty [name] - Presents for follow-up for the above issues. Her dyspnea significantly better following a 30 pound weight loss and continued rehabilitation. She uses oxygen with rehabilitation. Today walking desaturation test 185 feet 3 laps on room  air: She dropped to 87% on the second lap. But she is feeling better. She had spirometry today that shows improvement in FVC concomitant with weight loss but no change in diffusion capacity that reflects the presence of ongoing possible ILD.She continues to lose more weight she believes that she could lose another 30 pounds before the next visit.     OV 03/12/2019  Subjective:  Patient ID: Patty [name], female , DOB: October 07, 1951 , age 69 y.o. , MRN: [id number] , ADDRESS: [place] [id number]   03/12/2019 -   Chief Complaint  Patient presents with   Follow-up    PFT performed today.  Pt states she has been doing good since last visit. States she still becomes SOB with exertion, and has phlegm in the mornings, postnasal drainage which has used flonase to help. Pt also uses POC as needed.    Follow-up dyspnea that is multifactorial due to obesity, physical deconditioning and diastolic dysfunction Follow-up  mild interstitial lung disease not otherwise specifieds -with stability 2018 through 2020 [non-IPF pattern]; on observation   HPI Patty [name] 72 y.o. -presents for follow-up.  She is attending pulmonary rehabilitation and using oxygen with exertion.  Overall she feels stable.  Subjective symptom parameters and objective parameters are documented below.  She had high-resolution CT chest that showed shows that she has ILD.  At this point we can be confident that she has mild ILD but it is stable based on symptoms walking test and pulmonary function test.  The previous autoimmune test was negative.      OV 10/07/2019  Subjective:  Patient ID: Patty [name], female , DOB: January 18, 1951 , age 28 y.o. , MRN: [id number] , ADDRESS: [place] [id number]   10/07/2019 -   Chief Complaint  Patient presents with   Follow-up     Follow-up dyspnea that is multifactorial due to obesity, physical deconditioning and diastolic dysfunction Follow-up  mild  interstitial lung disease not otherwise specifieds -with stability 2018 through 2020 [non-IPF pattern]; on observation Follow-up mild pulmonary emphysema present on high-resolution CT chest  HPI Jaqulyn Chancellor 72 y.o. -presents for follow-up.  Prior visit was pre-pandemic.  Since then she has been sedentary at home.  She tells me that she is more short of breath.  The symptom scores below reflect that.  She thinks is because of the 30 pound weight gain because of sedentary living and social isolation following the onset of the pandemic.  She is frustrated by this.  Deep down she thinks her ILD is stable.  Pulmonary function test shows a drop in FVC but stability and DLCO suggesting weight gain is an ongoing issue for worsening dyspnea and pulmonary function test.  She is willing to have a high-dose flu shot today.  She also tells me that she is not  taking her Spiriva because it was expensive.  Although it did help her she is willing to try again and try to price it.  She also stated that her mom who never smoked had emphysema but her dad smoked heavily.  She has never been tested for alpha-1.   [place] -dropped. But tries not to use it in public    IMPRESSION: 1. Very mild scattered basilar subpleural reticulation and ground-glass similar to 01/29/2018. Nonspecific interstitial pneumonitis can not be excluded. Findings are indeterminate for UIP per consensus guidelines: Diagnosis of Idiopathic Pulmonary Fibrosis: An Official ATS/ERS/JRS/ALAT Clinical Practice Guideline. [place], Iss 5, [id number], Aug 26 2017. 2. Question cirrhosis. 3. Aortic atherosclerosis (ICD10-170.0). Coronary artery calcification. 4. Enlarged arteries, arterial hypertension. 5.  Emphysema (ICD10-J43.9).     Electronically Signed   By: Lorin Picket M.D.   On: 03/06/2019 16:59   ROS - per HPI     OV 06/23/2020  Subjective:  Patient ID: Patty [name], female ,  DOB: 1951-04-28 , age 63 y.o. , MRN: [id number] , ADDRESS: [address]  [place] [address]   06/23/2020 -   Chief Complaint  Patient presents with   Follow-up    shortness of breath with exertion   Follow-up dyspnea that is multifactorial due to obesity, physical deconditioning and diastolic dysfunction  Follow-up  mild interstitial lung disease not otherwise specifieds -with stability 2018 through 2020 [non-IPF pattern/indeterminate   -; on observation  - last CT Mach 2020 0> July 2021 wthout progression  Follow-up mild pulmonary emphysema present on high-resolution CT chest - alpha 1 MM  - o2 with exertion  - advised spiriva oct 2020 but too expensive  HPI Patty [name] 72 y.o. -returns for routine follow-up.  She says she has been doing well.  She uses portable oxygen cranks it up to 4 L - 5 L and then walks every day for 30 minutes.  Overall she feels stable.  Her symptom score itself is stable.  However on pulmonary function testing her FVC shows a decline.  This decline started in October 2020 and since then it is stable.  She attributes this to weight gain.  There is what she told me last time as well.  However when we walked her she seemed to desaturate much more easily.  We also needed to corrected desaturation at 5 L.  She thinks it is because the portable oxygen system is not delivering oxygen currently and is a technical issue with a portable machine.  However the distance of desaturation seems to worsen.  There is no leg swelling although she has some mild varicose veins at baseline.  She is not on nighttime oxygen and she recollects attest that this some years ago and was normal back then.  Currently she is only using portable oxygen.  Her last echocardiogram was few years ago.  There is no hemoptysis or worsening cough.  She got a Covid vaccine in February 2021 and she thinks things might of changed since then.   After she left I was able to review the home  palliative care note from 01/11/2022:.  She expressed her life wishes that she does not want to be put on the ventilator.  Does not want a feeding tube.  She likes to pass away peacefully like her mother a DNR and MOST form were given.  They will address this in the future with her.  Results for Patty [name], ALTIDOR [name] (MRN [id number])  as of 06/23/2020 11:49  Ref. Range 05/17/2017 13:11 02/05/2018 12:14 08/28/2018 11:22 03/12/2019 14:47 10/01/2019 15:58 30 pound weight gain 06/09/2020 11:25  FVC-Pre Latest Units: L 2.61 2.57 2.80 2.73 2.38 2.42  FVC-%Pred-Pre Latest Units: % 84 83 94 91 81 82  FEV1-Pre Latest Units: L 1.83 1.71 1.96 1.83 1.65 1.53   Results for Patty [name], Patty [name] (MRN [id number]) as of 06/23/2020 11:49  Ref. Range 05/17/2017 13:11 02/05/2018 12:14 08/28/2018 11:22 03/12/2019 14:47 10/01/2019 15:58 06/09/2020 11:25  DLCO unc Latest Units: ml/min/mmHg 12.64 15.15 14.53 13.77 14.39 13.48  DLCO unc % pred Latest Units: % 52 62 62 72 75 70    ROS - per HPI     OV 02/23/2021  Subjective:  Patient ID: Patty [name], female , DOB: 1951-12-19 , age 40 y.o. , MRN: [id number] , ADDRESS: [place] [id number] PCP [name], MD Patient Care Team: [name], MD as PCP - General (Family Medicine) Pyrtle, Lajuan Lines, MD as Consulting Physician (Gastroenterology) Shamleffer, Melanie Crazier, MD as Consulting Physician (Endocrinology) Brand Males, MD as Consulting Physician (Pulmonary Disease)  This Provider for this visit: Treatment Team:  Attending Provider: Brand Males, MD   Follow-up dyspnea that is multifactorial due to obesity, physical deconditioning and diastolic dysfunction, ILD and new dx Group 3 PAH - dec 2021  45 pppd prior smoking hix  Follow-up  mild interstitial lung disease not otherwise specifieds -with stability 2018 through 2020 [non-IPF pattern/indeterminate   -; on observation  - last CT Mach 2020 - > July 2021 without  progression  Inconsisent dxx of  pulmonary emphysema present on high-resolution CT chest - alpha 1 MM  - o2 with exertion  - advised spiriva oct 2020 but too expensive  Reported in only 1 CT. No mention in July 2021 CT  WHO group 3 Pulm htn with elevated PCWD - [facility] 12/07/20:   RA 13/12 mean 9 mmHg RV 48/8, EDP 12 PA 50/29 mean 38 PCWP 22/20 mean 20   O2 saturations: PA 62, Ao 97, SVC 64   CO 4.26 L/m, CI 1.96 Trans-pulmonic gradient 18 mmHg PVR 4.2 Woods Units  Grade 1 Disast dysfhn  - reportred July 2021 echo  Obesity  02/23/2021 -   Chief Complaint  Patient presents with   Follow-up    Doing ok     HPI Patty [name] 72 y.o. -returns for 57-monthfollow-up.  She has inconsistent diagnosis of emphysema.  Was reported in 1 scan but not another scans.  She has ILD.  She is foregoing biopsy.  She tells me overall that she is stable.  Although there are some weight gain.  She feels in the interim Metformin messed up her GI system and cause weight gain but she is glad to be out of it.  She had 6-minute walk test on 02/15/2021 at pulmonary rehabilitation.  She walked 7 and 15 feet in 6 minutes.  She did not break at all.  Her pulse ox dropped below 88% 2 times.  She corrected with 4 L of oxygen.  Her resting oxygen saturation was 96%.  And lowest was 86%.  She also had right heart catheterization in December 2021.  She has elevated pulmonary pressures but also elevated wedge.  Cardiology is following this up and she is on diuresis.  We spent some time discussing her care option.  She had pulmonary function test that shows stability in the last year and a half but declined in  2 years time.  Her high-resolution CT scan of the chest done in summer 2021 shows stability over 4 years.  She is not interested in antifibrotic's because of her colitis and also given her stability.  She does not want to go through pulmonary lung biopsy given the risks  She generally tries to avoid  medications.  She is happy going to pulmonary rehabilitation.  We discussed inhaled treprostinil as approved therapy for pulmonary hypertension -expressed to her the overall safety profile for over 20 years and approved recently for pulmonary hypertension and WHO group 3.  Explained the minimal side effect risk but she does not want to go through this  We discussed the option of participating in a clinical trial.  We discussed what is called the PULSE inhaled nitric oxide device study.  This is a 28-week plus study.  It was a device and a nitric oxide supply study.  She is actually interested in this.  She is participating clinical trials.  She understands the voluntary nature of this.  She understands the risks that come with clinical trials.  She understands the control of risk through close follow-up and interventions..  However her 6-minute walk test is quite adequate and also she starting pulmonary rehabilitation so she would not be able to participate in this trial for several months.  In addition she is aware that inhaled treprostinil which is standard of care therapy is currently on exclusion in this protocol current amendment.  She understands if she qualifies and chooses this trial that this would be a limitation.  We have given her the consent for review but at this time she does not qualify for this trial.  HRCT July 2021   IMPRESSION: 1. Spectrum of findings suggestive of a mild basilar predominant fibrotic interstitial lung disease without appreciable interval progression since baseline 05/12/2017 high-resolution chest CT. Favor NSIP, with UIP not excluded. Findings are indeterminate for UIP per consensus guidelines: Diagnosis of Idiopathic Pulmonary Fibrosis: An Official ATS/ERS/JRS/ALAT Clinical Practice Guideline. [place], Iss 5, [id number], Aug 26 2017. 2. Dilated main pulmonary artery, stable, suggesting chronic pulmonary arterial hypertension. 3. Aortic  Atherosclerosis (ICD10-I70.0).     Electronically Signed   By: Ilona Sorrel M.D.   On: 07/10/2020 15:50     OV 05/31/2021  Subjective:  Patient ID: Patty [name], female , DOB: February 20, 1951 , age 72 y.o. , MRN: [id number] , ADDRESS: [place] [id number] PCP [name], MD Patient Care Team: [name], MD as PCP - General (Family Medicine) Pyrtle, Lajuan Lines, MD as Consulting Physician (Gastroenterology) Shamleffer, Melanie Crazier, MD as Consulting Physician (Endocrinology) Brand Males, MD as Consulting Physician (Pulmonary Disease)  This Provider for this visit: Treatment Team:  Attending Provider: Brand Males, MD    Follow-up dyspnea that is multifactorial due to obesity, physical deconditioning and diastolic dysfunction, ILD and new dx Group 3 PAH - dec 2021  45 pppd prior smoking hix  Follow-up  mild interstitial lung disease not otherwise specifieds -with stability 2018 through 2020 [non-IPF pattern/indeterminate   -on observation  -last CT Mach 2020 - > July 2021 without progression  Inconsisent dxx of  pulmonary emphysema present on high-resolution CT chest - alpha 1 MM  - o2 with exertion  - advised spiriva oct 2020 but too expensive  Reported in only 1 CT. No mention in July 2021 CT  WHO group 3 Pulm htn with elevated PCWD - [facility] 12/07/20:  RA 13/12 mean 9 mmHg RV 48/8, EDP 12 PA 50/29 mean 38 PCWP 22/20 mean 20   O2 saturations: PA 62, Ao 97, SVC 64   CO 4.26 L/m, CI 1.96 Trans-pulmonic gradient 18 mmHg PVR 4.2 Woods Units  Grade 1 Disast dysfhn  - reportred July 2021 echo  Obesity   05/31/2021 -   Chief Complaint  Patient presents with   Follow-up    Pt states she has been doing okay since last visit. States she has begun pulmonary rehab which has helped.     HPI Patty [name] 72 y.o. -returns for follow-up.  She continues on supportive care with oxygen.  Room air at rest 3-5 L pulsed with  oxygen.  For the last few months she is on pulsed oxygen.  She uses 2 L nasal cannula at night with her CPAP.  She is completed pulmonary rehabilitation.  For the last few months she is using pulsed oxygen.  She feels this works better.  She is [facility] [employer] [facility].  She does elliptical or treadmill.  She feels her symptoms have improved.  She is continue to gain some weight though.  She again reported that she feels sensitive to medication and wants avoid antifibrotic's.  She was interested this time and discussing treprostinil versus pulsed research protocol for patients with interstitial lung disease and hypoxemic respiratory failure.  She has pulmonary hypertension based on 2021 December right heart catheterization.  We discussed the long-term safety profile of inhaled treprostinil.  Discussed the side effect profile.  After reflection about the fact the increase study showed improvement in 6-minute walk test, subgroup analysis potential modification of ILD and other composite outcome improvement.     IMPRESSION: 1. Spectrum of findings suggestive of a mild basilar predominant fibrotic interstitial lung disease without appreciable interval progression since baseline 05/12/2017 high-resolution chest CT. Favor NSIP, with UIP not excluded. Findings are indeterminate for UIP per consensus guidelines: Diagnosis of Idiopathic Pulmonary Fibrosis: An Official ATS/ERS/JRS/ALAT Clinical Practice Guideline. [place], Iss 5, [phone], Aug 26 2017. 2. Dilated main pulmonary artery, stable, suggesting chronic pulmonary arterial hypertension. 3. Aortic Atherosclerosis (ICD10-I70.0).     Electronically Signed   By: Ilona Sorrel M.D.   On: 07/10/2020 15:50   OV 09/07/2021  Subjective:  Patient ID: Patty [name], female , DOB: November 01, 1951 , age 30 y.o. , MRN: [id number] , ADDRESS: [place] [id number] PCP [name], MD Patient Care  Team: [name], MD as PCP - General (Family Medicine) Hilarie Fredrickson, Lajuan Lines, MD as Consulting Physician (Gastroenterology) [facility], Melanie Crazier, MD as Consulting Physician (Endocrinology) Brand Males, MD as Consulting Physician (Pulmonary Disease)  This Provider for this visit: Treatment Team:  Attending Provider: Brand Males, MD  Chronic hypoxemic respiratory failure due to all of the below ILD (interstitial lung disease) [facility])  -  Decline PF March 2020 through February 2022  but stable October 2020 through February 2022 on pulmonary function test -Stable on CT scan of the chest 2018 through July 2021 -Non-IPF likely - such as NSIP -Overall supportive care of plan without antifibrotic's given history of colitis [antifibrotic's can exacerbate this situation] and prior stability -You are using room air at rest with 3-5 L with exertion -pulse oxygen for the last few months    WHO group 3 pulmonary arterial hypertension ([place]) with elevaed PCWP to 20 on Dec 2021 - started tyvaso July 2022  [place] group 3  pulmonary arterial hypertension ([place]) with elevaed PCWP to 20 on Dec 2021 - -Continue CPAP at ngith with 2L [place] at night  09/07/2021 -   Chief Complaint  Patient presents with   Follow-up    PFT performed today.  States that she still becomes SOB with exertion. Pt is now on Tyvaso and states she finds herself needing her O2 more.     HPI Patty [name] 72 y.o. -returns for follow-up.  She started treprostinil in July 2022.  She says initially started doing better.  But once she got to 7 puffs 4 times daily she started getting more short of breath.  She felt overall that she is able to take a deep breath and feel less fatigued but when she got to that dose she said 1 time she walked across the room without oxygen which she normally walks without oxygen and she felt dizzy and winded.  Since then the symptoms have persisted.  She is also gained significant amount of  weight.  She is gained 16 pounds.  Her pulmonary function test is worse with a decline in both FVC and DLCO.  She says she is now needing 6 L of nasal cannula oxygen when she exercises.  Previously 5 L nasal cannula.  Symptom score wise and walking desaturation test wise she seems the same.  She does not want to do antifibrotic's because of prior history of colectomy.  At this point she is not sure if the treprostinil is helping her or hurting her or not doing anything.  OV 10/19/2021  Subjective:  Patient ID: Patty [name], female , DOB: 1951-05-27 , age 58 y.o. , MRN: [id number] , ADDRESS: [place] [address] PCP [name], MD Patient Care Team: [name], MD as PCP - General (Family Medicine) Hilarie Fredrickson, Lajuan Lines, MD as Consulting Physician (Gastroenterology) [facility], Melanie Crazier, MD as Consulting Physician (Endocrinology) Brand Males, MD as Consulting Physician (Pulmonary Disease)  This Provider for this visit: Treatment Team:  Attending Provider: Brand Males, MD   Chronic hypoxemic respiratory failure due to all of the below ILD (interstitial lung disease) ([place]) - 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 - diagnosed Sept 2022 -Non-IPF likely - such as NSIP -  Decline PF March 2020 through February 2022  but stable October 2020 through February 2022  -> declined sept 2022 PFT -Stable on CT scan of the chest 2018 through July 2021 -You are using room air at rest with 3-5 L with exertion -pulse oxygen for the last few months - started esbriet     WHO group 3 pulmonary arterial hypertension ([place]) with elevaed PCWP to 20 on Dec 2021 - started tyvaso July 2022 -> stopped sept 2022 of concern PFT declined could ave been due to tyvaso -> but restarted later in sept 2022   [place] group 3 pulmonary arterial hypertension ([place]) with elevaed PCWP to 20 on Dec 2021 - -Continue CPAP at ngith with 2L [place] at night   10/19/2021 -  followup  ILD   Type of visit: Telephone/Video Circumstance: COVID-19 national emergency Identification of patient Patty [name] with 04/13/1951 and MRN [id number] - 2 person identifier Risks: Risks, benefits, limitations of telephone visit explained. Patient understood and verbalized agreement to proceed Anyone else on call: just patient Patient location: [phone] - her cell.  At her home This provider location: [place] 72 y.o. - esbriet still pending. BAck on tyvaso since  mid-late sept 2022. Now doing it 4x/day - 5x/each time.   Also reports sewage leak since April/May 2022 Occassional odor. Some leakage through cleanup pipe. FEels this has made her respiration worse instead of tyvaso. Initialy she thought was tyvaso but later realized might have been due to sweage leak and finally fixed in sept 2022.She feels after long term stability things got worse because of sweage leak. She feels since leak fixed (Same time went back on tyvaso) she is better. In gym mets were down to 2.1 but now upto 2.4 mets (baseline 2.7 mets). NExt visit with mid dec 2022.   She wants to know if progression is perrmanent  Esbriet sill in process. Arriving in 2 days. She wil start at 1 pill tid x 1 week -> 2 pill tid and maintain  She can do visit 11/04/21 - 3.00pm - with [name] - video to assess followup on esbriet    OV 11/11/21 - video APP  72 yo female former smoker followed for ILD and Emphysema  Medical history significant for Crohn's Disease and DCHF   Today's video visit is a 1 month follow up for ILD . She has recently started on Esbriet 2 weeks ago, does notice some symptoms of fatigue , joint aches and fatigue. Started on Esbriet 267 mg 2 tab Twice daily, this week. No v/d. Some nausea . No bloody stools. Some intermittent pain under right ribs on/off. No urinary issues. No syncope.  Remains on Tyvaso Four times a day   Remains on Oxygen 6l/m with activity and  2l/m At bedtime  . None at rest  No change in O2 requirements   OV 12/07/2021  Subjective:  Patient ID: Patty [name], female , DOB: 1951-06-25 , age 16 y.o. , MRN: [id number] , ADDRESS: [place] [address] PCP [name], MD Patient Care Team: [name], MD as PCP - General (Family Medicine) Josue Hector, MD as PCP - Cardiology (Cardiology) Pyrtle, Lajuan Lines, MD as Consulting Physician (Gastroenterology) [facility], Melanie Crazier, MD as Consulting Physician (Endocrinology) Brand Males, MD as Consulting Physician (Pulmonary Disease)  This Provider for this visit: Treatment Team:  Attending Provider: Brand Males, MD    12/07/2021 -   Chief Complaint  Patient presents with   Follow-up    PFT performed 12/12. Pt states she began Esbriet November 2022 and said that she has had different side effects from the med.    HPI Patty [name] 72 y.o. -at last visit we started pirfenidone but as soon as she went up to 2 pills 3 times daily she started having significant side effects including drop in urine output therefore she stopped.  Most of the side effects are GI.  Present even at 1 pill 3 times daily.  She says stopped pirfenidone yesterday and ever since then beginning to feel better especially today.  Continues on oxygen at the same level.  Is on inhaled treprostinil nebulizer.  Currently increased it to 6 times at 4 times a day.  Slowly going to work her self up.  She held off on escalating this while she was on pirfenidone start.  We looked at her file-per registry but need clarification from radiology about whether she has traction bronchiectasis.  In addition need to know the current status of her polycythemia.  Last CT scan of the chest was in 2021.  We discussed about the fact that her ILD is progressing.  She is lives alone.  No  siblings in the city parents are deceased.  No children.  She has neighbors.  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  She says she regularly updates him.  We did not discuss CODE STATUS.  Offered home palliative care and she is willing.  She knows that she needs to get medical alert.       OV 02/01/2022  Subjective:  Patient ID: Patty [name], female , DOB: 1951/04/10 , age 15 y.o. , MRN: [id number] , ADDRESS: [place] [address] PCP [name], MD Patient Care Team: [name], MD as PCP - General (Family Medicine) Josue Hector, MD as PCP - Cardiology (Cardiology) Pyrtle, Lajuan Lines, MD as Consulting Physician (Gastroenterology) [facility], Melanie Crazier, MD as Consulting Physician (Endocrinology) Brand Males, MD as Consulting Physician (Pulmonary Disease)  This Provider for this visit: Treatment Team:  Attending Provider: Brand Males, MD    02/01/2022 -   Chief Complaint  Patient presents with   Follow-up    Pt states she is about the same since last visit.    HPI Patty [name] 72 y.o. -returns for follow-up.  She tells me the inhaler presently is working really well for her.  She feels that given the Crohn's disease is better.  Her exercise tolerance on the treadmill is better.  Is increased to 2.5 METS.  Her shortness of breath score is also improved.  There is normal sewage exposure in the house.  She feels her lungs are improving.  She had a high-resolution CT scan of the chest.  Dr. Rosario Jacks the radiologist says there is ILA and not ILD.  She [as well as myself] no perplexed by this.  We looked at the CT scans ourselves.  In my view this may be early ILD.  It might be slightly worse in 2018.  Looking at lung function test status greater than 15% total decline in 3 years between 2019 and 2022.  However we do agree that the interstitial findings on the CT scan are mild.  Also she does not feel that she has significant emphysema.  She feels the CT report is over waiting the emphysema.  She says even  though she smoked it was not a true pack per day.  She says the Spiriva did not help her.  Therefore she is a little bit perplexed with the emphysema diagnosis on the CT scan.  She and I agreed that we will discuss in a case conference.  I did submit her name for this.  At this point in time she is not on antifibrotic's and prefers not to be on it.  She is content being on treprostinil.     CT Chest data  CT Chest High Resolution  Result Date: 01/31/2022 CLINICAL DATA:  Interstitial lung disease, former smoker. EXAM: CT CHEST WITHOUT CONTRAST TECHNIQUE: Multidetector CT imaging of the chest was performed following the standard protocol without intravenous contrast. High resolution imaging of the lungs, as well as inspiratory and expiratory imaging, was performed. RADIATION DOSE REDUCTION: This exam was performed according to the departmental dose-optimization program which includes automated exposure control, adjustment of the mA and/or kV according to patient size and/or use of iterative reconstruction technique. COMPARISON:  07/10/2020. FINDINGS: Cardiovascular: Atherosclerotic calcification of the aorta, aortic valve and coronary arteries. Pulmonic trunk is enlarged. Heart size within normal limits. No pericardial effusion. Mediastinum/Nodes: No pathologically enlarged mediastinal or axillary lymph nodes. Hilar regions are difficult  to definitively evaluate without IV contrast but appear grossly unremarkable. Esophagus is grossly unremarkable. Lungs/Pleura: Centrilobular emphysema. Negative for subpleural reticulation, traction bronchiectasis/bronchiolectasis, ground-glass, architectural distortion or honeycombing. Subpleural densities and ground-glass seen on supine imaging largely clear on prone imaging. Mild bibasilar scarring and bronchiectasis. No pleural fluid. Airway is unremarkable. No air trapping. Upper Abdomen: Liver margin is slightly irregular. Visualized portions of the liver, gallbladder,  adrenal glands and right kidney are otherwise unremarkable. 1.4 cm exophytic low-attenuation lesion off the interpolar left kidney is likely a cyst. Visualized portions of the spleen, pancreas, stomach and bowel are grossly. Scattered surgical clips in the abdomen. Upper abdominal lymph nodes are not enlarged by CT size criteria. Musculoskeletal: Degenerative changes in the spine. No worrisome lytic or sclerotic lesions. IMPRESSION: 1. Favor absence of interstitial lung disease based on prone imaging. 2. Bibasilar scarring and mild bronchiectasis. 3. Marginal irregularity the liver is indicative of cirrhosis. 4. Aortic atherosclerosis (ICD10-I70.0). Coronary artery calcification. 5.  Emphysema (ICD10-J43.9). 6. Enlarged pulmonic trunk indicative pulmonary arterial hypertension. Electronically Signed   By: Lorin Picket M.D.   On: 01/31/2022 11:53         OV 08/19/2022  Subjective:  Patient ID: Patty [name], female , DOB: Apr 02, 1951 , age 64 y.o. , MRN: [id number] , ADDRESS: [place] [address] PCP [name], MD Patient Care Team: [name], MD as PCP - General (Family Medicine) Josue Hector, MD as PCP - Cardiology (Cardiology) Pyrtle, Lajuan Lines, MD as Consulting Physician (Gastroenterology) [facility], Melanie Crazier, MD as Consulting Physician (Endocrinology) Brand Males, MD as Consulting Physician (Pulmonary Disease)  This Provider for this visit: Treatment Team:  Attending Provider: Brand Males, MD  Type of visit: Video Circumstance: patietn preferene Identification of patient Patty [name] with 11/20/1951 and MRN [id number] - 2 person identifier Risks: Risks, benefits, limitations of telephone visit explained. Patient understood and verbalized agreement to proceed Anyone else on call: jsut patient Patient location: her home This provider location: [address]; [place]; [place] [address]. [facility]. [phone]   08/19/2022 -  Followup above via video    HPI Patty [name] 72 y.o. -overall stable but maybe some more tired than usual. Goes to gym and works out treadmill (!5 min), elliptical 2-3h/week. Walks mostly in stores where she does better. Trying to lose weight- have not lose weight . She has not tried medications for weight loss yet but worried in the settling of Crohns she is worried about side effect. STill on Tyvaso - 9 puff 4 times per day. Did  echo yesterday - gr1 ddx and very small pericardia effusion  per rreport. Had repeat CT April 20223 -> they are saying no ILD again. But she is on tyvaso and helping her dyspnea. She feels tyvaso since 2022 and church controlling sewage overflows in [place] being in remission has helped   RE sewage exposure  -is the nearby church behind her house and there ws intermittent exposure. She feels sewage exposure made her worse. She has an attorney  Using o2 6L [place] with exertion. At rest  Today room air at rest - 96% and HR 77%   CT chest HRCT- Apirl 2023  IMPRESSION: 1. Mild, bland appearing bandlike scarring of the bilateral lung bases, unchanged compared to recent prior examination. No evidence of fibrotic interstitial lung disease. 2. Coronary artery disease. 3. Enlargement of the main pulmonary artery, as can be  seen in pulmonary hypertension. 4. Coarse, nodular contour of the partially imaged liver in the upper abdomen, suggesting cirrhosis.   Aortic Atherosclerosis (ICD10-I70.0).     Electronically Signed   By: Delanna Ahmadi M.D.   On: 03/31/2022 10:16     CT Chest da    PFT OV 09/22/2022  Subjective:  Patient ID: Patty [name], female , DOB: 03/08/1951 , age 52 y.o. , MRN: [id number] , ADDRESS: [place] [address] PCP [name], MD Patient Care Team: [name], MD as PCP - General (Family Medicine) Josue Hector, MD as PCP - Cardiology  (Cardiology) Pyrtle, Lajuan Lines, MD as Consulting Physician (Gastroenterology) [facility], Melanie Crazier, MD as Consulting Physician (Endocrinology) Brand Males, MD as Consulting Physician (Pulmonary Disease)  This Provider for this visit: Treatment Team:  Attending Provider: Brand Males, MD   09/22/2022 -   Chief Complaint  Patient presents with   Follow-up    PFT performed today.  Pt states she has had good and bad days with her breathing especially when the weather changes it becomes affected.     HPI Patty [name] 72 y.o. -returns for follow-up.  Overall she feels she is doing stable but her lung function shows continued decline.  She says since last year there is no further sewage exposure in the church behind her house.  She says the way they repaired it she is worried that there may be future risk of exposure.  It is great life charge on [employer].  The pastor that is Robinette Haines.  She is upset about the situation.  She is worried that the significant sewage exposure last year has continued to contribute to her lung damage.  There is a concept called hit-and-run hypothesis.  Currently she is not smoking.  But pulmonary function test shows continued decline alpha-1 antitrypsin is normal.  She feels the Tyvaso is helping her.  She is up-to-date with the COVID and flu shot this season.  Discussed RSV vaccine and she will have that.  She continues CPAP for sleep apnea.  Of note previous CT scan has reported emphysema.  She states.  I did not help her in the past but she is willing to try sample Trelegy.    OV 12/29/2022  Subjective:  Patient ID: Patty [name], female , DOB: [birth year] , age 33 y.o. , MRN: [id number] , ADDRESS: [place] [address] PCP [name], MD Patient Care Team: [name], MD as PCP - General (Family Medicine) Josue Hector, MD as PCP - Cardiology (Cardiology) Pyrtle, Lajuan Lines, MD as Consulting Physician  (Gastroenterology) [facility], Melanie Crazier, MD as Consulting Physician (Endocrinology) Brand Males, MD as Consulting Physician (Pulmonary Disease)  This Provider for this visit: Treatment Team:  Attending Provider: Brand Males, MD    12/29/2022 -   Chief Complaint  Patient presents with   Follow-up    Pulled muscles behind right knee area.  Unable to very active during August, Sept, Oct and first part of November 2023.  Oxygen drops with exertion while using  6 lpm pulsed oxygen.  Trelegy did not improve breathing, took both samples and stopped.    Chronic hypoxemic respiratory failure due to all of the below ILD (interstitial lung disease) ([place]) - 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 - diagnosed Sept 2022 (in Feb a2023 and April 2023 they are saying no ILD and she feels is because crohns is under remission, tyvaso  definitely since sep 2022, and resolution nearbyhome sewage exposure may 2022 - sept 2022) -Non-IPF likely - such as NSIP -  Decline PF March 2020 through February 2022  but stable October 2020 through February 2022  -> declined sept 2022 PFT -Stable on CT scan of the chest 2018 through July 2021  -You are using room air at rest with 3-5 L with exertion -pulse oxygen for the last few months - started esbriet  oct/nov 2022 - stopped GI intolerance 11/29/21    WHO group 3 pulmonary arterial hypertension ([place]) with elevaed PCWP to 20 on Dec 2021 - started tyvaso July 2022 -> stopped sept 2022 of concern PFT declined could ave been due to tyvaso -> but restarted later in sept 2022   WHO group 3 pulmonary arterial hypertension ([place]) with elevaed PCWP to 20 on Dec 2021 - -Continue CPAP at ngith with 2L [place] at   [place]  HPI Patty [name] 72 y.o. -returns for follow-up.  Since her last visit there is no further sewage exposure although the sewage drain itself is not fixed.  She has had difficulty getting an attorney.  She did  get pulmonary function test recently and shows continued stability in both FVC and DLCO since September 2022 which was the last time.  Of sewage exposure.  It is definitely progressive sieve since February 2022 and 2021.  Symptoms course recently is stable.  She says she is improved her effort tolerance and is able to do 2.7 METS exercise.  She says recently she overdid her exercise and in the summer 2023 around August she pulled her right hamstring.  After that she was given some muscle relaxant that made her confused and had side effects.  Around the same time she was also on Trelegy and she not sure if some of the side effects were from Trelegy.  She did get tachycardic with Trelegy.  Therefore she does not want to do this anymore.  Last month when she had pulmonary function test albuterol seem to help her so she just wants albuterol for rescue and she did not want any long-acting beta agonist.  Previously we tried Spiriva and she does not want to try this again.  She is up-to-date with her vaccines.  Overall she is feeling stable using 2 L at rest and 6 L with exertion.  Symptom score also shows stability.    SYMPTOM SCALE - ILD 03/12/2019  10/07/2019 30# wt gain  06/23/2020 250# 09/07/2021 266# - tyvaso since July 2022 12/07/2021 272# 02/01/2022 tyvaso slightly so much better 09/22/2022  12/29/2022   O2 use RA ra   2-6L 2-6L 2-6L 2-6L   Shortness of Breath 0 -> 5 scale with 5 being worst (score 6 If unable to do)   0 0     At rest 0 0 0 0 with o2 0 0 0 0  Simple tasks - showers, clothes change, eating, shaving 0 0  0 0 1 1 1.5  Household (dishes, doing bed, laundry) 0 0 0 0 _0 1.5  Shopping 0 0 0 0 1 0 0 0  Walking at own pace 0 4.5 4._1 1.5 0  Walking up Stairs 1-_2 34.5  Total (40 - 48) Dyspnea Score 2 9.5 9.5 6 al with o2 14 6 13.5 7.5  How bad is your cough? 0 0 0 0 0 At the time of onset of0 0  0  How bad is your fatigue 0 1 0 _0 0  nausea   0 0 0 0 0 0  vomit   0 0  0 0 0 0  diarrhe   0 _1 0 0  axniety   _2 depression   0 1 1 0 0 0        Simple office walk 185 feet x  3 laps goal with forehead probe 03/12/2019  10/07/2019  06/23/2020  09/07/2021   O2 used Room air Room air Room air ra  Number laps completed 3 2nd lap 1 lap 1 lap  Comments about pace slow slow slow   Resting Pulse Ox/HR 100% and 85/min 96% and 72/min 97% and 77 97% and 76  Final Pulse Ox/HR 88% and 135/min 86% and 105/min 87% and 112/min 83 and 112/min  Desaturated </= 88% yes     Desaturated <= 3% points Yes, 12     Got Tachycardic >/= 90/min yes     Symptoms at end of test Mild dyspnea  Corrected with 5L [place]. Even with 4L [place] downt to  87% Corrected with 6LNC at 94$ for antoher 2lpas  Miscellaneous comments x  ? worse    CT Chest data    PFT     Latest Ref Rng & Units 12/23/2022    1:51 PM 09/22/2022    2:27 PM 12/06/2021   12:57 PM 09/07/2021   12:54 PM 02/22/2021   11:52 AM 06/09/2020   11:25 AM 10/01/2019    3:58 PM  PFT Results  FVC-Pre L 2.28  P 2.16  2.32  2.28  2.38  2.42  2.38   FVC-Predicted Pre % 80  P 76  80  79  81  82  81   FVC-Post L 2.41  P        FVC-Predicted Post % 85  P        Pre FEV1/FVC % % 62  P 60  60  62  63  63  69   Post FEV1/FCV % % 62  P        FEV1-Pre L 1.41  P 1.30  1.38  1.41  1.50  1.53  1.65   FEV1-Predicted Pre % 65  P 61  63  65  68  68  74   FEV1-Post L 1.50  P        DLCO uncorrected ml/min/mmHg 12.06  P 10.92  11.82  11.94  15.05  13.48  14.39   DLCO UNC% % 64  P 57  62  63  79  70  75   DLCO corrected ml/min/mmHg 11.85  P 10.61  11.45  11.94  27.44  12.89    DLCO COR %Predicted % 62  P 56  60  63  144  67    DLVA Predicted % 67  P 66  66  70  152  70  75   TLC L 5.08  P        TLC % Predicted % 102  P        RV % Predicted % 117  P          P Preliminary result       has a past medical history of Abnormal nuclear stress test, Anxiety, Aortic atherosclerosis (HCC), Arthritis, Chronic diastolic CHF  (congestive heart failure) ([place]), Coronary artery calcification seen on CT scan, Crohn  disease ([place]), DVT (deep venous thrombosis) ([place]), Dyspnea, Grade I diastolic dysfunction, Hyperlipidemia, ILD (interstitial lung disease) ([place]), OSA on CPAP, Pernicious anemia, Polycythemia vera ([place]), Pulmonary embolism ([place]) (2010), Pulmonary hypertension ([place]), and Sleep disorder (01/10/2020).   reports that she quit smoking about 14 years ago. Her smoking use included cigarettes. She has a 45.00 pack-year smoking history. She has never used smokeless tobacco.  Past Surgical History:  Procedure Laterality Date    RIGHT BREAST LUMPECTOMY WITH RADIOACTIVE SEED LOCALIZATION (Right Breast)  02/04/2021   BIOPSY  09/30/2019   Procedure: BIOPSY;  Surgeon: Jerene Bears, MD;  Location: [facility];  Service: Gastroenterology;;   BREAST LUMPECTOMY WITH RADIOACTIVE SEED LOCALIZATION Right 02/04/2021   Procedure: RIGHT BREAST LUMPECTOMY WITH RADIOACTIVE SEED LOCALIZATION;  Surgeon: Coralie Keens, MD;  Location: [facility];  Service: General;  Laterality: Right;   CARDIAC CATHETERIZATION     COLON RESECTION  1993   COLONOSCOPY WITH PROPOFOL N/A 09/30/2019   Procedure: COLONOSCOPY WITH PROPOFOL;  Surgeon: Jerene Bears, MD;  Location: [facility];  Service: Gastroenterology;  Laterality: N/A;   HIP ARTHROPLASTY Right    x 2, initial right THA, then subsequent right THA revision    LEFT HEART CATH AND CORONARY ANGIOGRAPHY N/A 07/28/2017   Procedure: Left Heart Cath and Coronary Angiography;  Surgeon: Burnell Blanks, MD;  Location: [facility];  Service: Cardiovascular;  Laterality: N/A;   POLYPECTOMY  09/30/2019   Procedure: POLYPECTOMY;  Surgeon: Jerene Bears, MD;  Location: [facility];  Service: Gastroenterology;;   RIGHT HEART CATH N/A 12/07/2020   Procedure: RIGHT HEART CATH;  Surgeon: Sherren Mocha, MD;  Location: [facility];  Service: Cardiovascular;  Laterality: N/A;    Allergies   Allergen Reactions   Tizanidine Hcl Shortness Of Breath   Victoza [Liraglutide] Shortness Of Breath   Metformin And Related Diarrhea    Increased moodiness   Onion Other (See Comments)    sereve diarrhea, stomach pains (Crohn's flares)   Lasix [Furosemide] Itching    Hypersensitivity - pt can take as needed    Pirfenidone Other (See Comments)    GI side effects   Potassium-Containing Compounds Other (See Comments)    Due to chron's, difficult passing though kidneys not allowing absorption in the body    Prednisone Other (See Comments)    Altered mood   Sulfa Drugs Cross Reactors Other (See Comments)    "feels like bugs crawling on me"   Trelegy Ellipta [Fluticasone-Umeclidin-Vilant] Palpitations    Immunization History  Administered Date(s) Administered   Fluad Quad(high Dose 65+) 10/07/2019, 09/09/2020, 11/27/2021, 09/19/2022   Influenza, High Dose Seasonal PF 08/28/2018   Influenza-Unspecified 11/23/2017   PFIZER Comirnaty(Gray Top)Covid-19 Tri-Sucrose Vaccine 09/19/2022   PFIZER(Purple Top)SARS-COV-2 Vaccination 02/16/2020, 03/17/2020, 12/14/2020, 05/14/2021, 10/27/2021   Pfizer Covid-19 Vaccine Bivalent Booster 75yr & up 10/27/2021   Pneumococcal Conjugate-13 02/13/2018   Pneumococcal Polysaccharide-23 03/12/2019   Respiratory Syncytial Virus Vaccine,Recomb Aduvanted(Arexvy) 09/24/2022    Family History  Problem Relation Age of Onset   Glaucoma Mother    High blood pressure Mother    High Cholesterol Mother    Sleep apnea Mother    Diabetes Mellitus II Father    Sleep apnea Father    Anxiety disorder Father    COPD Brother    Heart disease Brother    Non-Hodgkin's lymphoma Brother    Diabetes Mellitus II Brother    Glaucoma Brother    Colon cancer Neg Hx    Esophageal  cancer Neg Hx    Pancreatic cancer Neg Hx    Stomach cancer Neg Hx    Liver disease Neg Hx      Current Outpatient Medications:    amoxicillin (AMOXIL) 500 MG capsule, Take 2,000 mg by  mouth See admin instructions. Before dental procedures, Disp: , Rfl:    aspirin EC 81 MG tablet, Take 81 mg by mouth daily., Disp: , Rfl:    Calcium Carbonate-Vit D-Min (CALCIUM 1200) 1200-1000 MG-UNIT CHEW, Chew 1 tablet by mouth daily., Disp: , Rfl:    CANNABIDIOL PO, Take 1 Dose by mouth 2 (two) times daily as needed (anxiety/sleep.). CBD Oil, Disp: , Rfl:    cetirizine (ZYRTEC) 10 MG tablet, Take 10 mg by mouth daily as needed (sinus/allergies.)., Disp: , Rfl:    cetirizine-pseudoephedrine (ZYRTEC-D) 5-120 MG tablet, Take 1 tablet by mouth daily as needed (sinus headaches.)., Disp: , Rfl:    clonazePAM (KLONOPIN) 0.5 MG tablet, TAKE 0.5-1 TABLETS (0.25-0.5 MG TOTAL) BY MOUTH DAILY AS NEEDED FOR ANXIETY, Disp: 90 tablet, Rfl: 1   cyanocobalamin (VITAMIN B12) 1000 MCG/ML injection, Inject 1 mL (1,000 mcg total) into the muscle every 30 (thirty) days. Inject 1 mL (1,000 mcg) into the muscle every 30 days, Disp: 12 mL, Rfl: 0   FLUoxetine (PROZAC) 20 MG capsule, Take 1 capsule (20 mg total) by mouth daily., Disp: 90 capsule, Rfl: 3   fluticasone (FLONASE) 50 MCG/ACT nasal spray, Place 1-2 sprays into both nostrils daily as needed for allergies or rhinitis., Disp: , Rfl:    furosemide (LASIX) 20 MG tablet, Take 1 tablet (20 mg total) by mouth as needed for fluid., Disp: 90 tablet, Rfl: 0   Glycerin-Polysorbate 80 (REFRESH DRY EYE THERAPY OP), Place 1 drop into both eyes daily., Disp: , Rfl:    hydrocortisone (PROCTOZONE-HC) 2.5 % rectal cream, Place 1 application rectally 2 (two) times daily as needed (Crohn's flare)., Disp: 28 g, Rfl: 2   ibuprofen (ADVIL) 200 MG tablet, Take 400-800 mg by mouth every 8 (eight) hours as needed (pain.)., Disp: , Rfl:    mesalamine (PENTASA) 250 MG CR capsule, Take 250-500 mg by mouth 4 (four) times daily as needed (crohn's disease flare ups)., Disp: , Rfl:    Multiple Vitamins-Minerals (ICAPS AREDS 2 PO), Take 1 tablet by mouth daily., Disp: , Rfl:    prednisoLONE  acetate (PRED FORTE) 1 % ophthalmic suspension, Place 1 drop into both eyes 2 (two) times daily as needed (Uveitis)., Disp: , Rfl:    Probiotic Product (PROBIOTIC PO), Take 1 capsule by mouth 2 (two) times a week. As needed, Disp: , Rfl:    SYRINGE-NEEDLE, DISP, 3 ML (LUER LOCK SAFETY SYRINGES) 25G X 1" 3 ML MISC, USE AS DIRECTED, Disp: 100 each, Rfl: 5   traMADol (ULTRAM) 50 MG tablet, Take 1 tablet (50 mg total) by mouth every 6 (six) hours as needed., Disp: 20 tablet, Rfl: 0   traZODone (DESYREL) 50 MG tablet, TAKE 0.5-1 TABLETS BY MOUTH AT BEDTIME AS NEEDED FOR SLEEP., Disp: 90 tablet, Rfl: 3   Treprostinil (TYVASO REFILL) 0.6 MG/ML SOLN, Inhale 54 mcg into the lungs in the morning, at noon, in the evening, and at bedtime., Disp: 87 mL, Rfl: 11   Fluticasone-Umeclidin-Vilant (TRELEGY ELLIPTA) 100-62.5-25 MCG/ACT AEPB, Inhale 1 puff into the lungs daily. (Patient not taking: Reported on 12/29/2022), Disp: 14 each, Rfl: 0      Objective:   Vitals:   12/29/22 1555  BP: 122/72  Pulse: 91  Temp: 98.9 F (37.2 C)  TempSrc: Oral  SpO2: 93%  Weight: 265 lb 3.2 oz (120.3 kg)  Height: _0  (1.626 m)    Estimated body mass index is 45.52 kg/m as calculated from the following:   Height as of this encounter: _1  (1.626 m).   Weight as of this encounter: 265 lb 3.2 oz (120.3 kg).  _2 @  Filed Weights   12/29/22 1555  Weight: 265 lb 3.2 oz (120.3 kg)     Physical Exam   General: No distress. obese Neuro: Alert and Oriented x 3. GCS 15. Speech normal Psych: Pleasant Resp:  Barrel Chest - no.  Wheeze - no, Crackles - yes some, No overt respiratory distress CVS: Normal heart sounds. Murmurs - no Ext: Stigmata of Connective Tissue Disease - no HEENT: Normal upper airway. PEERL +. No post nasal drip        Assessment:       ICD-10-CM   1. Chronic hypoxemic respiratory failure (HCC)  J96.11     2. ILD (interstitial lung disease) ([place])  J84.9     3. WHO group 3  pulmonary arterial hypertension (HCC)  I27.23          Plan:     Patient Instructions  Chronic hypoxemic respiratory failure  ILD (interstitial lung disease) ([place])  WHO group 3 pulmonary arterial hypertension ([place]) with elevaed PCWP to 20 on Dec 2021     Right now there is no seewage exposure since sept 2022 that you know of from church behind your house. Lung function stable recently since sept 2022 but progressive since 2021/feb 2022. Currently no flare up. Currently not on home palliative care   Plan - start albuterol MDI 2 puff as needed for emergency - continue tyvaso at curent dose --Continue oxygen 3-6 L nasal cannula pulsed with exertion and at night 2 L nasal cannula and room air at rest -Continue exercise program  -However continue to keep her pulse ox at greater than or equal to 88% using a pulse oximeter --Make attempts to lose weight - talk to your PCP [name], MD abut weight loss drugs    OSA on CPAP  Stable  Plan  -Continue CPAP at ngith with 2L [place] at night  per the advice of his sleep doctor - at some point can check o2 level at night   Associated emphysema on CT -feb 2023 Normal Alpha 1 AT  - prior spiriva did not help - trelegy might have caused problems but unclear if this was a muscle relaxant  Plan  -prescribe albluterold 2 puff as needed   Follow-up  -3  months s face to face with Dr Chase Caller; 30 min visit  = symptom score and walk test at followupo   -  Moderate Complexity MDM NEW OFFICE  The table below is from the 2021 E/M guidelines, first released in 2021, with minor revisions added in 2023. Must meet the requirements for 2 out of 3 dimensions to qualify.    Number and complexity of problems addressed Amount and/or complexity of data reviewed Risk of complications and/or morbidity  One or more chronic illness with mild exacerbation, progression, or side effects of treatment  Two or more stable chronic illnesses - -ILD,  pulm htn  One undiagnosed new problem with uncertain prognosis  One acute illness with systemic symptoms   Acute complicated injury Must meet the requirements for 1 of 3 of the categories)  Category 1: Tests and documents, historian  Any combination of 3 of the following:  Assessment requiring an independent historian  Review of prior external records  Review of results of each unique test  Ordering of each unique test    Category 2: Interpretation of tests  Independent interpretation of a test perfromed by another physician/NPP  Category 3: Discuss management/tests  Discussion of magagement or tests with an external physician/NPP Prescription drug management - 02  Decision regarding minor surgery with identfied patient or procedure risk factors  Decision regarding elective major surgery without identified patient or procedure risk factors  Diagnosis or treatment significantly limited by social determinants of health             SIGNATURE    Dr. Brand Males, M.D., F.C.C.P,  Pulmonary and Critical Care Medicine Staff Physician, [place] Director - Interstitial Lung Disease  Program  [facility] [place] [facility] [place], [place], [address]  Pager: [phone], If no answer or between  15:00h - 7:00h: call [phone] Telephone: [phone]  4:31 PM 12/29/2022

## 2022-12-29 NOTE — Patient Instructions (Addendum)
Chronic hypoxemic respiratory failure  ILD (interstitial lung disease) ([place])  WHO group 3 pulmonary arterial hypertension ([place]) with elevaed PCWP to 20 on Dec 2021     Right now there is no seewage exposure since sept 2022 that you know of from church behind your house. Lung function stable recently since sept 2022 but progressive since 2021/feb 2022. Currently no flare up. Currently not on home palliative care   Plan - start albuterol MDI 2 puff as needed for emergency - continue tyvaso at curent dose --Continue oxygen 3-6 L nasal cannula pulsed with exertion and at night 2 L nasal cannula and room air at rest -Continue exercise program  -However continue to keep her pulse ox at greater than or equal to 88% using a pulse oximeter --Make attempts to lose weight - talk to your PCP [name], MD abut weight loss drugs    OSA on CPAP  Stable  Plan  -Continue CPAP at ngith with 2L [place] at night  per the advice of his sleep doctor - at some point can check o2 level at night   Associated emphysema on CT -feb 2023 Normal Alpha 1 AT  - prior spiriva did not help - trelegy might have caused problems but unclear if this was a muscle relaxant  Plan  -prescribe albluterold 2 puff as needed   Follow-up  -3  months s face to face with Dr Chase Caller; 30 min visit  = symptom score and walk test at followupo   -

## 2022-12-29 NOTE — Addendum Note (Signed)
Addended byOralia Rud M on: 12/29/2022 04:43 PM   Modules accepted: Orders

## 2023-01-01 ENCOUNTER — Encounter: Payer: Self-pay | Admitting: Family Medicine

## 2023-01-02 ENCOUNTER — Other Ambulatory Visit: Payer: Self-pay

## 2023-01-02 NOTE — Telephone Encounter (Signed)
Pt is requesting these Rx to be sent to [facility] on [address]

## 2023-01-03 MED ORDER — TRAZODONE HCL 50 MG PO TABS: ORAL_TABLET | ORAL | 3 refills | Status: DC

## 2023-01-03 MED ORDER — FLUOXETINE HCL 20 MG PO CAPS: 20.0000 mg | ORAL_CAPSULE | Freq: Every day | ORAL | 3 refills | Status: DC

## 2023-01-03 MED ORDER — CLONAZEPAM 0.5 MG PO TABS: 0.2500 mg | ORAL_TABLET | Freq: Every day | ORAL | 1 refills | Status: DC | PRN

## 2023-01-03 MED ORDER — CYANOCOBALAMIN 1000 MCG/ML IJ SOLN: 1000.0000 ug | INTRAMUSCULAR | 1 refills | Status: DC

## 2023-01-03 MED ORDER — AMOXICILLIN 500 MG PO CAPS: 2000.0000 mg | ORAL_CAPSULE | Freq: Once | ORAL | 1 refills | Status: DC | PRN

## 2023-01-03 NOTE — Addendum Note (Signed)
Addended by: Billey Chang on: 01/03/2023 12:03 PM   Modules accepted: Orders

## 2023-01-07 NOTE — Progress Notes (Signed)
09/15/22- 72/ [employer] oF former smoker for sleep evaluation with concern of OSA, Insomnia Medical prolem list includes ILD(Dr Ramaswamy), PAH WHO Group 3, Chronic Respiratory Failure with Hypoxia, Aortic Atherosclerosis, dCHF, CAD, hx DVT/PE, Allergic Rhinitis, Crohn's,  O2 3L Has been referred to [facility] and to Palliative Care HST 06/01/17- AHI 33.8/ hr, desaturation to 69%, body weight 240 lbs   CPAP to 8 CPAP auto 5-15/  Adapt Download compliance 97%, AHI 1.8/ hr Epworth score-1 Body weight today-269 lbs Covid vax-5 Phizer Flu vax-pending next week She started CPAP with original diagnosis in 2018.  Now having persistent problems falling asleep and maintaining sleep.  Bedtime between 11 PM and 3 AM.  Currently taking melatonin plus clonazepam and add an OTC sleep med.  Has tried trazodone. Waking 2 or 3 times at night and staying awake for an hour or more at a time.  Depending on when she has sustained sleep, she will wake between 9 AM and 12 noon. CPAP machine is at least 73 years old and she is getting a message on the screen that the motor life has been exceeded.  Life has been better off with CPAP.  She does bleed in oxygen 3 L for sleep, 4 L when awake, 6 L with exertion.  01/10/23- 72 yoF former smoker followed for OSA, Insomnia complicated by ILD(Dr Chase Caller), PAH WHO Group 3, Chronic Respiratory Failure with Hypoxia, Aortic Atherosclerosis, dCHF, CAD, hx DVT/PE, Allergic Rhinitis, Crohn's,  O2 3L Has been referred to [facility] and to Palliative Care CPAP auto 5-15/  [employer]       Airsense 10 replaced   09/15/22 Download compliance-100%, AHI 0.8/ hr Body weight today-265 lbs Covid vax-6 Phizer Flu vax-had -----Pt is doing okay  Download reviewed.  She is comfortable with CPAP and continues to blend-in oxygen 2L for sleep.  Has home oxygen on 4 L when awake increasing to 6 L pulse for exertion..  She has scheduled follow-up with Dr. Chase Caller for her ILD. CPAP was replaced successfully  last Fall.[name] HRCT 03/31/22 (Dr Chase Caller) IMPRESSION: 1. Mild, bland appearing bandlike scarring of the bilateral lung bases, unchanged compared to recent prior examination. No evidence of fibrotic interstitial lung disease. 2. Coronary artery disease. 3. Enlargement of the main pulmonary artery, as can be seen in pulmonary hypertension. 4. Coarse, nodular contour of the partially imaged liver in the upper abdomen, suggesting cirrhosis. Aortic Atherosclerosis (ICD10-I70.0).  Constitutional:    weight loss, night sweats, fevers, chills, fatigue, lassitude. HEENT:    headaches, difficulty swallowing, tooth/dental problems, sore throat,       sneezing, +itching, ear ache, +nasal congestion, post nasal drip, snoring CV:    chest pain, orthopnea, PND, swelling in lower extremities, anasarca, dizziness, palpitations Resp:   +shortness of breath with exertion or at rest.                productive cough,   non-productive cough, coughing up of blood.              change in color of mucus.  wheezing.   Skin:    rash or lesions. GI:  No-   heartburn, indigestion, abdominal pain, nausea, vomiting, diarrhea,                 change in bowel habits, loss of appetite GU: dysuria, change in color of urine, no urgency or frequency.   flank pain. MS:   joint pain, stiffness, decreased range of motion, back pain. Neuro-  nothing unusual Psych:  change in mood or affect.  depression or +anxiety.   memory loss.  OBJ- Physical Exam General- Alert, Oriented, Affect-appropriate, Distress- none acute, + obese, O2 3L Skin- rash-none, lesions- none, excoriation- none Lymphadenopathy- none Head- atraumatic   +Xanthelasma            Eyes- Gross vision intact, PERRLA, conjunctivae and secretions clear            Ears- Hearing, canals-normal            Nose- Clear, no-Septal dev, mucus, polyps, erosion, perforation             Throat- Mallampati IV , mucosa clear , drainage- none, tonsils+, teeth+ Neck-  flexible , trachea midline, no stridor , thyroid nl, carotid no bruit Chest - symmetrical excursion , unlabored           Heart/CV- RRR , no murmur , no gallop  , no rub, nl s1 s2                           - JVD- none , edema- none, stasis changes- none, varices- none           Lung- + crackles, wheeze- none, cough- none , dullness-none, rub- none           Chest wall-  Abd-  Br/ Gen/ Rectal- Not done, not indicated Extrem- cyanosis- none, clubbing, none, atrophy- none, strength- nl Neuro- grossly intact to observation

## 2023-01-10 ENCOUNTER — Ambulatory Visit (INDEPENDENT_AMBULATORY_CARE_PROVIDER_SITE_OTHER): Payer: Medicare Other | Admitting: Internal Medicine

## 2023-01-10 ENCOUNTER — Encounter: Payer: Self-pay | Admitting: Internal Medicine

## 2023-01-10 VITALS — BP 124/64 | HR 95 | Ht 64.0 in | Wt 265.4 lb

## 2023-01-10 DIAGNOSIS — G4733 Obstructive sleep apnea (adult) (pediatric): Secondary | ICD-10-CM

## 2023-01-10 DIAGNOSIS — J9611 Chronic respiratory failure with hypoxia: Secondary | ICD-10-CM

## 2023-01-10 NOTE — Patient Instructions (Signed)
We can continue CPAP auto 5-15 with oxygen  Follow up with Dr Chase Caller for your ILD as planned  Please call if I can help

## 2023-01-11 ENCOUNTER — Encounter: Payer: Self-pay | Admitting: Internal Medicine

## 2023-01-11 NOTE — Assessment & Plan Note (Signed)
She is dependent on oxygen and managing that according to instructions.

## 2023-01-11 NOTE — Assessment & Plan Note (Signed)
Benefits from CPAP and doing well with good compliance and control Plan-continue auto 5-15

## 2023-01-16 ENCOUNTER — Telehealth: Payer: Self-pay | Admitting: Family Medicine

## 2023-01-16 NOTE — Telephone Encounter (Signed)
Copied from [place] [id number]. Topic: Medicare AWV >> Jan 16, 2023  9:20 AM Gillis Santa wrote: Reason for CRM: LVM PATIENT TO CALL [phone] [place]

## 2023-01-18 ENCOUNTER — Encounter: Payer: Self-pay | Admitting: Internal Medicine

## 2023-02-06 DIAGNOSIS — M79661 Pain in right lower leg: Secondary | ICD-10-CM | POA: Insufficient documentation

## 2023-02-07 ENCOUNTER — Telehealth: Payer: Self-pay

## 2023-02-07 NOTE — Telephone Encounter (Signed)
Patty [name] called to cancel her surgery with Dr. Posey Pronto on 03/13/2023. She stated her foot is doing good and she is not in pain. I told her call us back if she needed to reschedule. Notified Dr. Posey Pronto and Caren Griffins at [facility].

## 2023-02-21 ENCOUNTER — Telehealth: Payer: Self-pay | Admitting: Family Medicine

## 2023-02-21 NOTE — Telephone Encounter (Signed)
Called patient to schedule Medicare Annual Wellness Visit (AWV). Left message for patient to call back and schedule Medicare Annual Wellness Visit (AWV).  Last date of AWV: 12/03/2020  Please schedule an appointment at any time with Otila Kluver, [facility].  If any questions, please contact me at [phone].  Thank you ,  Shaune Pollack [facility] AWV TEAM Direct Dial [phone]

## 2023-02-23 ENCOUNTER — Other Ambulatory Visit: Payer: Self-pay

## 2023-02-23 ENCOUNTER — Ambulatory Visit: Payer: Medicare Other | Attending: Sports Medicine

## 2023-02-23 DIAGNOSIS — R2689 Other abnormalities of gait and mobility: Secondary | ICD-10-CM | POA: Diagnosis not present

## 2023-02-23 DIAGNOSIS — M79661 Pain in right lower leg: Secondary | ICD-10-CM | POA: Insufficient documentation

## 2023-02-23 DIAGNOSIS — R262 Difficulty in walking, not elsewhere classified: Secondary | ICD-10-CM | POA: Insufficient documentation

## 2023-02-23 NOTE — Therapy (Signed)
OUTPATIENT PHYSICAL THERAPY LOWER EXTREMITY EVALUATION   Patient Name: Patty [name] MRN: [id number] DOB:November 16, 1951, 72 y.o., female Today's Date: 02/23/2023  END OF SESSION:  PT End of Session - 02/23/23 1520     Visit Number 1    Date for PT Re-Evaluation 05/18/23    Progress Note Due on Visit 10    PT Start Time 1519    PT Stop Time 1600    PT Time Calculation (min) 41 min    Activity Tolerance Patient tolerated treatment well    Behavior During Therapy [facility] for tasks assessed/performed             Past Medical History:  Diagnosis Date   Abnormal nuclear stress test    a. 07/2017: cath with no CAD   Anxiety    Aortic atherosclerosis (HCC)    Arthritis    Chronic diastolic CHF (congestive heart failure) (HCC)    Coronary artery calcification seen on CT scan    Crohn disease ([place])    DVT (deep venous thrombosis) (HCC)    Dyspnea    Grade I diastolic dysfunction    Hyperlipidemia    ILD (interstitial lung disease) (HCC)    OSA on CPAP    CPAP    AND O2   Pernicious anemia    Polycythemia vera ([place])    Pulmonary embolism ([place]) 2010   s/p hip surgery , reports this was never truly confirmed    Pulmonary hypertension ([place])    Sleep disorder 01/10/2020   Uses klonopin for sleep since 2014; sleep walking. And anxiety   Past Surgical History:  Procedure Laterality Date    RIGHT BREAST LUMPECTOMY WITH RADIOACTIVE SEED LOCALIZATION (Right Breast)  02/04/2021   BIOPSY  09/30/2019   Procedure: BIOPSY;  Surgeon: Jerene Bears, MD;  [facility]: [facility];  Service: Gastroenterology;;   BREAST LUMPECTOMY WITH RADIOACTIVE SEED LOCALIZATION Right 02/04/2021   Procedure: RIGHT BREAST LUMPECTOMY WITH RADIOACTIVE SEED LOCALIZATION;  Surgeon: Coralie Keens, MD;  Location: [place];  Service: General;  Laterality: Right;   CARDIAC CATHETERIZATION     COLON RESECTION  1993   COLONOSCOPY WITH PROPOFOL N/A 09/30/2019   Procedure: COLONOSCOPY WITH PROPOFOL;  Surgeon: Jerene Bears, MD;  [facility];  Service: Gastroenterology;  Laterality: N/A;   HIP ARTHROPLASTY Right    x 2, initial right THA, then subsequent right THA revision    LEFT HEART CATH AND CORONARY ANGIOGRAPHY N/A 07/28/2017   Procedure: Left Heart Cath and Coronary Angiography;  Surgeon: Burnell Blanks, MD;  Location: [place] [facility];  Service: Cardiovascular;  Laterality: N/A;   POLYPECTOMY  09/30/2019   Procedure: POLYPECTOMY;  Surgeon: Jerene Bears, MD;  Location: [facility];  Service: Gastroenterology;;   RIGHT HEART CATH N/A 12/07/2020   Procedure: RIGHT HEART CATH;  Surgeon: Sherren Mocha, MD;  Location: [place] [facility];  Service: Cardiovascular;  Laterality: N/A;   Patient Active Problem List   Diagnosis Date Noted   Pulmonary emphysema with fibrosis of lung ([place]) 01/31/2022   Sclerosing adenosis of right breast 02/04/2021   Prediabetes 02/01/2021   Pulmonary hypertension, unspecified ([place]) 12/07/2020   Osteopenia 08/05/2020   Adjustment disorder with anxiety 04/29/2020   Sleep disorder 01/10/2020   Chronic allergic rhinitis 01/10/2020   Pernicious anemia 01/10/2020   Chronic prescription benzodiazepine use 01/10/2020   Benign neoplasm of rectum    Dyslipidemia 09/24/2019   Morbid obesity ([place]) 09/24/2019   History of DVT (deep vein thrombosis),  with PE 07/25/2019   OSA on CPAP    Crohn disease ([place]) 06/15/2017   ILD (interstitial lung disease) ([place]) 05/29/2017   Chronic respiratory failure with hypoxia ([place]) 05/29/2017   History of polycythemia vera 11/12/2014    PCP: Billey Chang, MD  REFERRING PROVIDER: Pedro Earls MD  REFERRING DIAG: R lower leg pain  THERAPY DIAG:  Pain in right lower leg - Plan: PT plan of care cert/re-cert  Impairment of balance  Difficulty in walking, not elsewhere classified  Rationale for Evaluation and Treatment: Rehabilitation  ONSET DATE: August 2023  SUBJECTIVE:   SUBJECTIVE STATEMENT: I bent down in  August and pulled my R calf and felt a pop.  Then pulled it again when twisting.  Since then have had some episodes of feling like my R lateral ankle is going ot give a way when I stood up in the morning, also aces all the time R lateral lower leg  PERTINENT HISTORY: Had new shoes, reached forward to the ground, shoes were taller, strained my R calf PAIN:  Are you having pain? Yes: Pain location: R lateral lower leg Pain description: aches, deep ache Aggravating factors: sit to stand Relieving factors: rests  PRECAUTIONS: Other: using on demand O2, checked her own O2 sats throughout eval  WEIGHT BEARING RESTRICTIONS: No  FALLS:  Has patient fallen in last 6 months? No  LIVING ENVIRONMENT: Lives with: lives alone Lives in: House/apartment Stairs: Yes: Internal: 2 steps; can reach both Has following equipment at home: Single point cane2  OCCUPATION: retired  PLOF: Independent  PATIENT GOALS: to be more I, stop pain R leg, avoid falls   NEXT MD VISIT: 05/25/23  OBJECTIVE:   DIAGNOSTIC FINDINGS: na, patient reports x rays - R ankle  PATIENT SURVEYS:  LEFS 30/80  62.5% disability COGNITION: Overall cognitive status: Within functional limits for tasks assessed     SENSATION: WFL  EDEMA:  None noted  MUSCLE LENGTH: Hamstrings: Right wfl deg; Left wfl deg   POSTURE:  R hip ER in standing, overpronates B ankles,with gait, greater R than L R medial gastrocs atrophied compared to L  PALPATION: Tender , thickened R medial gastrocs muscle belly, non tender post knee tender R fibular head  LOWER EXTREMITY ROM:  Passive ROM Right eval Left eval  Hip flexion 90   Hip extension    Hip abduction    Hip adduction    Hip internal rotation    Hip external rotation    Knee flexion    Knee extension    Ankle dorsiflexion 6 12  Ankle plantarflexion wfl wfl  Ankle inversion Wfl wfl  Ankle eversion Wfl wfl   (Blank rows = not tested)  LOWER EXTREMITY MMT:MMT B LE's  equal and wnl.  Single leg heel raises 6 to 8 reps each  MMT Right eval Left eval  Hip flexion    Hip extension    Hip abduction    Hip adduction    Hip internal rotation    Hip external rotation    Knee flexion    Knee extension    Ankle dorsiflexion    Ankle plantarflexion    Ankle inversion    Ankle eversion     (Blank rows = not tested)  LOWER EXTREMITY SPECIAL TESTS:  Na today  FUNCTIONAL TESTS:  Single leg stance R and L 3 sec each  GAIT: Distance walked: 51' within department, rested and self monitored her pulse oxygen, dropped to 88 x 2 todaybut  rebounded Assistive device utilized:  supplemental on demand O2 Level of assistance: Complete Independence Comments: na   TODAY'S TREATMENT:                                                                                                                              DATE: 02/23/23 Instructed in therex as described below, also demod use of roller /rolling pin to self masage R medial calf     PATIENT EDUCATION:  Education details: POC Person educated: Patient Education method: Consulting civil engineer, Media planner, Verbal cues, and Handouts Education comprehension: verbalized understanding, returned demonstration, and verbal cues required  HOME EXERCISE PROGRAM: Access Code: [email or web address] URL: [email or web address] Date: 02/23/2023 Prepared by: Warren Lacy Zafar Debrosse  Exercises - Seated Calf Stretch with Strap  - 3 x daily - 7 x weekly - 1 sets - 3 reps - 30 hold - Long Sitting Ankle Plantar Flexion with Resistance  - 1 x daily - 7 x weekly - 2 sets - 10 reps  ASSESSMENT:  CLINICAL IMPRESSION: Patient is a 72 y.o. female who was seen today for physical therapy evaluation and treatment for R calf pain. Strained R calf .x 2 and has experienced lingering deficits.  Atrophied R gastrocs medially, decreased Rom R ankle dorsiflexion, mild strength deficits, balance deficits B Les with single leg stance, also tender thickened R medial  gastrocs.will benefit from skilled PT to address her deficits and improve her balance, pain levels    OBJECTIVE IMPAIRMENTS: cardiopulmonary status limiting activity, decreased activity tolerance, decreased balance, decreased endurance, difficulty walking, decreased ROM, decreased strength, hypomobility, and pain.   ACTIVITY LIMITATIONS: carrying, lifting, bending, standing, squatting, stairs, transfers, and locomotion level  PARTICIPATION LIMITATIONS: meal prep, laundry, and community activity  PERSONAL FACTORS: Fitness and 1-2 comorbidities: pulmonary fibrosis, h/o R hip and pelvic fracture  are also affecting patient's functional outcome.   REHAB POTENTIAL: Good  CLINICAL DECISION MAKING: Stable/uncomplicated  EVALUATION COMPLEXITY: Low   GOALS: Goals reviewed with patient? Yes  SHORT TERM GOALS: Target date: 03/09/23 I HEP Baseline: intiated today Goal status: INITIAL  LONG TERM GOALS: Target date: 5/23/242.  1.Improve R ankle Rom for dorsiflexion from 6 to 10 degrees or greater Baseline: 6 Goal status: INITIAL  2.  Improve unilateral balance to 10 sec or greater on each leg for reduced fall risk Baseline: 3 sec each leg Goal status: INITIAL  3.  Able to complete unilateral heel raise R LE from 6 reps to 15 ore greater Baseline:  Goal status: INITIAL  PLAN:  PT FREQUENCY: 1-2x/week  PT DURATION: 12 weeks  PLANNED INTERVENTIONS: Therapeutic exercises, Therapeutic activity, Neuromuscular re-education, Balance training, Gait training, Patient/Family education, Self Care, and Joint mobilization  PLAN FOR NEXT SESSION: manual techniques to reduce adhesions R medial gastrocs, jt mobs R ankle to improve dorsiflexion Rom, advance R ankle strengthening and stretching   Oscar Forman L Gena Laski, PT 02/23/2023, 5:53 PM

## 2023-02-28 ENCOUNTER — Ambulatory Visit: Payer: Medicare Other | Attending: Sports Medicine | Admitting: Physical Therapy

## 2023-02-28 DIAGNOSIS — R2689 Other abnormalities of gait and mobility: Secondary | ICD-10-CM | POA: Insufficient documentation

## 2023-02-28 DIAGNOSIS — M79661 Pain in right lower leg: Secondary | ICD-10-CM | POA: Insufficient documentation

## 2023-02-28 DIAGNOSIS — R262 Difficulty in walking, not elsewhere classified: Secondary | ICD-10-CM | POA: Insufficient documentation

## 2023-02-28 NOTE — Therapy (Signed)
OUTPATIENT PHYSICAL THERAPY LOWER EXTREMITY   Patient Name: Patty [name] MRN: [id number] DOB:11-13-1951, 72 y.o., female Today's Date: 02/28/2023  END OF SESSION:  PT End of Session - 02/28/23 1315     Visit Number 2    Date for PT Re-Evaluation 05/18/23    PT Start Time 1315    PT Stop Time 1400    PT Time Calculation (min) 45 min             Past Medical History:  Diagnosis Date   Abnormal nuclear stress test    a. 07/2017: cath with no CAD   Anxiety    Aortic atherosclerosis (HCC)    Arthritis    Chronic diastolic CHF (congestive heart failure) (HCC)    Coronary artery calcification seen on CT scan    Crohn disease ([place])    DVT (deep venous thrombosis) (HCC)    Dyspnea    Grade I diastolic dysfunction    Hyperlipidemia    ILD (interstitial lung disease) ([place])    OSA on CPAP    CPAP    AND O2   Pernicious anemia    Polycythemia vera ([place])    Pulmonary embolism ([place]) 2010   s/p hip surgery , reports this was never truly confirmed    Pulmonary hypertension ([place])    Sleep disorder 01/10/2020   Uses klonopin for sleep since 2014; sleep walking. And anxiety   Past Surgical History:  Procedure Laterality Date    RIGHT BREAST LUMPECTOMY WITH RADIOACTIVE SEED LOCALIZATION (Right Breast)  02/04/2021   BIOPSY  09/30/2019   Procedure: BIOPSY;  Surgeon: Jerene Bears, MD;  [facility];  Service: Gastroenterology;;   BREAST LUMPECTOMY WITH RADIOACTIVE SEED LOCALIZATION Right 02/04/2021   Procedure: RIGHT BREAST LUMPECTOMY WITH RADIOACTIVE SEED LOCALIZATION;  Surgeon: Coralie Keens, MD;  Location: [place];  Service: General;  Laterality: Right;   CARDIAC CATHETERIZATION     COLON RESECTION  1993   COLONOSCOPY WITH PROPOFOL N/A 09/30/2019   Procedure: COLONOSCOPY WITH PROPOFOL;  Surgeon: Jerene Bears, MD;  [facility];  Service: Gastroenterology;  Laterality: N/A;   HIP ARTHROPLASTY Right    x 2, initial right THA, then subsequent right THA  revision    LEFT HEART CATH AND CORONARY ANGIOGRAPHY N/A 07/28/2017   Procedure: Left Heart Cath and Coronary Angiography;  Surgeon: Burnell Blanks, MD;  Location: [place] [facility];  Service: Cardiovascular;  Laterality: N/A;   POLYPECTOMY  09/30/2019   Procedure: POLYPECTOMY;  Surgeon: Jerene Bears, MD;  [facility];  Service: Gastroenterology;;   RIGHT HEART CATH N/A 12/07/2020   Procedure: RIGHT HEART CATH;  Surgeon: Sherren Mocha, MD;  Location: [place] [facility];  Service: Cardiovascular;  Laterality: N/A;   Patient Active Problem List   Diagnosis Date Noted   Pulmonary emphysema with fibrosis of lung ([place]) 01/31/2022   Sclerosing adenosis of right breast 02/04/2021   Prediabetes 02/01/2021   Pulmonary hypertension, unspecified ([place]) 12/07/2020   Osteopenia 08/05/2020   Adjustment disorder with anxiety 04/29/2020   Sleep disorder 01/10/2020   Chronic allergic rhinitis 01/10/2020   Pernicious anemia 01/10/2020   Chronic prescription benzodiazepine use 01/10/2020   Benign neoplasm of rectum    Dyslipidemia 09/24/2019   Morbid obesity ([place]) 09/24/2019   History of DVT (deep vein thrombosis), with PE 07/25/2019   OSA on CPAP    Crohn disease ([place]) 06/15/2017   ILD (interstitial lung disease) ([place]) 05/29/2017   Chronic respiratory failure with  hypoxia ([place]) 05/29/2017   History of polycythemia vera 11/12/2014    PCP: Billey Chang, MD  REFERRING PROVIDER: Pedro Earls MD  REFERRING DIAG: R lower leg pain  THERAPY DIAG:  Pain in right lower leg  Impairment of balance  Difficulty in walking, not elsewhere classified  Rationale for Evaluation and Treatment: Rehabilitation  ONSET DATE: August 2023  SUBJECTIVE:   SUBJECTIVE STATEMENT: Sore and trying to do ex but have not figured out a strap to stretch DF PERTINENT HISTORY: Had new shoes, reached forward to the ground, shoes were taller, strained my R calf PAIN:  Are you having pain? Yes:  Pain location: R lateral lower leg Pain description: aches, deep ache Aggravating factors: sit to stand Relieving factors: rests  PRECAUTIONS: Other: using on demand O2, checked her own O2 sats throughout eval  WEIGHT BEARING RESTRICTIONS: No  FALLS:  Has patient fallen in last 6 months? No  LIVING ENVIRONMENT: Lives with: lives alone Lives in: House/apartment Stairs: Yes: Internal: 2 steps; can reach both Has following equipment at home: Single point cane2  OCCUPATION: retired  PLOF: Independent  PATIENT GOALS: to be more I, stop pain R leg, avoid falls   NEXT MD VISIT: 05/25/23  OBJECTIVE:   DIAGNOSTIC FINDINGS: na, patient reports x rays - R ankle  PATIENT SURVEYS:  LEFS 30/80  62.5% disability COGNITION: Overall cognitive status: Within functional limits for tasks assessed     SENSATION: WFL  EDEMA:  None noted  MUSCLE LENGTH: Hamstrings: Right wfl deg; Left wfl deg   POSTURE:  R hip ER in standing, overpronates B ankles,with gait, greater R than L R medial gastrocs atrophied compared to L  PALPATION: Tender , thickened R medial gastrocs muscle belly, non tender post knee tender R fibular head  LOWER EXTREMITY ROM:  Passive ROM Right eval Left eval  Hip flexion 90   Hip extension    Hip abduction    Hip adduction    Hip internal rotation    Hip external rotation    Knee flexion    Knee extension    Ankle dorsiflexion 6 12  Ankle plantarflexion wfl wfl  Ankle inversion Wfl wfl  Ankle eversion Wfl wfl   (Blank rows = not tested)  LOWER EXTREMITY MMT:MMT B LE's equal and wnl.  Single leg heel raises 6 to 8 reps each  MMT Right eval Left eval  Hip flexion    Hip extension    Hip abduction    Hip adduction    Hip internal rotation    Hip external rotation    Knee flexion    Knee extension    Ankle dorsiflexion    Ankle plantarflexion    Ankle inversion    Ankle eversion     (Blank rows = not tested)  LOWER EXTREMITY SPECIAL TESTS:   Na today  FUNCTIONAL TESTS:  Single leg stance R and L 3 sec each  GAIT: Distance walked: 62' within department, rested and self monitored her pulse oxygen, dropped to 88 x 2 todaybut rebounded Assistive device utilized:  supplemental on demand O2 Level of assistance: Complete Independence Comments: na   TODAY'S TREATMENT:  DATE:   02/28/23 Black bar Df and PF 2 sets 10 Calf and soleus stretch on black bar On foam mat marching, toe raise and heel raises Sitting RT foot on sit fit 4 way s 15 x each Red tband ankle 4 way 2 sets 10 Ankle PROM and jt mobs to increase ROM Calf stretching and DSTW- talked about DN for next session Step stretch for calf and soleous   02/23/23 Instructed in therex as described below, also demod use of roller /rolling pin to self masage R medial calf     PATIENT EDUCATION:  Education details: POC Person educated: Patient Education method: Consulting civil engineer, Media planner, Verbal cues, and Handouts Education comprehension: verbalized understanding, returned demonstration, and verbal cues required  HOME EXERCISE PROGRAM: Access Code: [email or web address] URL: [email or web address] Date: 02/23/2023 Prepared by: Warren Lacy Speaks  Exercises - Seated Calf Stretch with Strap  - 3 x daily - 7 x weekly - 1 sets - 3 reps - 30 hold - Long Sitting Ankle Plantar Flexion with Resistance  - 1 x daily - 7 x weekly - 2 sets - 10 reps  ASSESSMENT:  CLINICAL IMPRESSION:  Educ pt on mutli items to use at home for DF stretch belt,sheet,step stretch. Progressed ex for ROM and strength. Decreased ROM and strength in RT foot as noted bby weakness of DF and tightness.pt states doing Nustep at gym 3 x a week  OBJECTIVE IMPAIRMENTS: cardiopulmonary status limiting activity, decreased activity tolerance, decreased balance, decreased endurance, difficulty  walking, decreased ROM, decreased strength, hypomobility, and pain.   ACTIVITY LIMITATIONS: carrying, lifting, bending, standing, squatting, stairs, transfers, and locomotion level  PARTICIPATION LIMITATIONS: meal prep, laundry, and community activity  PERSONAL FACTORS: Fitness and 1-2 comorbidities: pulmonary fibrosis, h/o R hip and pelvic fracture  are also affecting patient's functional outcome.   REHAB POTENTIAL: Good  CLINICAL DECISION MAKING: Stable/uncomplicated  EVALUATION COMPLEXITY: Low   GOALS: Goals reviewed with patient? Yes  SHORT TERM GOALS: Target date: 03/09/23 I HEP Baseline: intiated today Goal status: INITIAL  LONG TERM GOALS: Target date: 5/23/242.  1.Improve R ankle Rom for dorsiflexion from 6 to 10 degrees or greater Baseline: 6 Goal status: INITIAL  2.  Improve unilateral balance to 10 sec or greater on each leg for reduced fall risk Baseline: 3 sec each leg Goal status: INITIAL  3.  Able to complete unilateral heel raise R LE from 6 reps to 15 ore greater Baseline:  Goal status: INITIAL  PLAN:  PT FREQUENCY: 1-2x/week  PT DURATION: 12 weeks  PLANNED INTERVENTIONS: Therapeutic exercises, Therapeutic activity, Neuromuscular re-education, Balance training, Gait training, Patient/Family education, Self Care, and Joint mobilization  PLAN FOR NEXT SESSION: manual techniques to reduce adhesions R medial gastrocs, jt mobs R ankle to improve dorsiflexion Rom, advance R ankle strengthening and stretching DRY NEEDLE NEXT SESSION   Patty [name],ANGIE, PTA 02/28/2023, 1:16 PM [facility] [place], [place], [address] Phone: [phone]   Fax:  [phone]  Patient Details  Name: Tmya Pizzoferrato MRN: [id number] Date of Birth: 08/09/1951 Referring Provider:  [name], MD  Encounter Date: 02/28/2023   Patty [name], PTA 02/28/2023, 1:15 PM  [facility] [place]. [place], [place], [address] Phone: [phone]   Fax:  [phone]

## 2023-03-06 ENCOUNTER — Other Ambulatory Visit: Payer: Self-pay

## 2023-03-06 ENCOUNTER — Ambulatory Visit: Payer: Medicare Other

## 2023-03-06 DIAGNOSIS — M79661 Pain in right lower leg: Secondary | ICD-10-CM | POA: Diagnosis not present

## 2023-03-06 DIAGNOSIS — R262 Difficulty in walking, not elsewhere classified: Secondary | ICD-10-CM

## 2023-03-06 DIAGNOSIS — R2689 Other abnormalities of gait and mobility: Secondary | ICD-10-CM | POA: Diagnosis not present

## 2023-03-06 NOTE — Therapy (Signed)
OUTPATIENT PHYSICAL THERAPY LOWER EXTREMITY   Patient Name: Patty [name] MRN: [id number] DOB:08-18-1951, 72 y.o., female Today's Date: 03/06/2023  END OF SESSION:  PT End of Session - 03/06/23 1652     Visit Number 3    Date for PT Re-Evaluation 05/18/23    PT Start Time 1518    PT Stop Time 1600    PT Time Calculation (min) 42 min    Activity Tolerance Patient tolerated treatment well    Behavior During Therapy [facility] for tasks assessed/performed              Past Medical History:  Diagnosis Date   Abnormal nuclear stress test    a. 07/2017: cath with no CAD   Anxiety    Aortic atherosclerosis (HCC)    Arthritis    Chronic diastolic CHF (congestive heart failure) (HCC)    Coronary artery calcification seen on CT scan    Crohn disease ([place])    DVT (deep venous thrombosis) (HCC)    Dyspnea    Grade I diastolic dysfunction    Hyperlipidemia    ILD (interstitial lung disease) ([place])    OSA on CPAP    CPAP    AND O2   Pernicious anemia    Polycythemia vera ([place])    Pulmonary embolism ([place]) 2010   s/p hip surgery , reports this was never truly confirmed    Pulmonary hypertension ([place])    Sleep disorder 01/10/2020   Uses klonopin for sleep since 2014; sleep walking. And anxiety   Past Surgical History:  Procedure Laterality Date    RIGHT BREAST LUMPECTOMY WITH RADIOACTIVE SEED LOCALIZATION (Right Breast)  02/04/2021   BIOPSY  09/30/2019   Procedure: BIOPSY;  Surgeon: Jerene Bears, MD;  [facility];  Service: Gastroenterology;;   BREAST LUMPECTOMY WITH RADIOACTIVE SEED LOCALIZATION Right 02/04/2021   Procedure: RIGHT BREAST LUMPECTOMY WITH RADIOACTIVE SEED LOCALIZATION;  Surgeon: Coralie Keens, MD;  Location: [facility];  Service: General;  Laterality: Right;   CARDIAC CATHETERIZATION     COLON RESECTION  1993   COLONOSCOPY WITH PROPOFOL N/A 09/30/2019   Procedure: COLONOSCOPY WITH PROPOFOL;  Surgeon: Jerene Bears, MD;  [facility];  Service:  Gastroenterology;  Laterality: N/A;   HIP ARTHROPLASTY Right    x 2, initial right THA, then subsequent right THA revision    LEFT HEART CATH AND CORONARY ANGIOGRAPHY N/A 07/28/2017   Procedure: Left Heart Cath and Coronary Angiography;  Surgeon: Burnell Blanks, MD;  Location: [place] [facility];  Service: Cardiovascular;  Laterality: N/A;   POLYPECTOMY  09/30/2019   Procedure: POLYPECTOMY;  Surgeon: Jerene Bears, MD;  [facility];  Service: Gastroenterology;;   RIGHT HEART CATH N/A 12/07/2020   Procedure: RIGHT HEART CATH;  Surgeon: Sherren Mocha, MD;  Location: [place] [facility];  Service: Cardiovascular;  Laterality: N/A;   Patient Active Problem List   Diagnosis Date Noted   Pulmonary emphysema with fibrosis of lung ([place]) 01/31/2022   Sclerosing adenosis of right breast 02/04/2021   Prediabetes 02/01/2021   Pulmonary hypertension, unspecified ([place]) 12/07/2020   Osteopenia 08/05/2020   Adjustment disorder with anxiety 04/29/2020   Sleep disorder 01/10/2020   Chronic allergic rhinitis 01/10/2020   Pernicious anemia 01/10/2020   Chronic prescription benzodiazepine use 01/10/2020   Benign neoplasm of rectum    Dyslipidemia 09/24/2019   Morbid obesity ([place]) 09/24/2019   History of DVT (deep vein thrombosis), with PE 07/25/2019   OSA on CPAP  Crohn disease ([place]) 06/15/2017   ILD (interstitial lung disease) ([place]) 05/29/2017   Chronic respiratory failure with hypoxia ([place]) 05/29/2017   History of polycythemia vera 11/12/2014    PCP: Billey Chang, MD  REFERRING PROVIDER: Pedro Earls MD  REFERRING DIAG: R lower leg pain  THERAPY DIAG:  Pain in right lower leg  Impairment of balance  Difficulty in walking, not elsewhere classified  Rationale for Evaluation and Treatment: Rehabilitation  ONSET DATE: August 2023  SUBJECTIVE:   SUBJECTIVE STATEMENT: Better , can tell I am walking better now, not as sore, havent gotten my balance back  yet! PERTINENT HISTORY: Had new shoes, reached forward to the ground, shoes were taller, strained my R calf PAIN:  Are you having pain? Yes: Pain location: R lateral lower leg Pain description: aches, deep ache Aggravating factors: sit to stand Relieving factors: rests  PRECAUTIONS: Other: using on demand O2, checked her own O2 sats throughout eval  WEIGHT BEARING RESTRICTIONS: No  FALLS:  Has patient fallen in last 6 months? No  LIVING ENVIRONMENT: Lives with: lives alone Lives in: House/apartment Stairs: Yes: Internal: 2 steps; can reach both Has following equipment at home: Single point cane2  OCCUPATION: retired  PLOF: Independent  PATIENT GOALS: to be more I, stop pain R leg, avoid falls   NEXT MD VISIT: 05/25/23  OBJECTIVE:   DIAGNOSTIC FINDINGS: na, patient reports x rays - R ankle  PATIENT SURVEYS:  LEFS 30/80  62.5% disability COGNITION: Overall cognitive status: Within functional limits for tasks assessed     SENSATION: WFL  EDEMA:  None noted  MUSCLE LENGTH: Hamstrings: Right wfl deg; Left wfl deg   POSTURE:  R hip ER in standing, overpronates B ankles,with gait, greater R than L R medial gastrocs atrophied compared to L  PALPATION: Tender , thickened R medial gastrocs muscle belly, non tender post knee tender R fibular head  LOWER EXTREMITY ROM:  Passive ROM Right eval Left eval  Hip flexion 90   Hip extension    Hip abduction    Hip adduction    Hip internal rotation    Hip external rotation    Knee flexion    Knee extension    Ankle dorsiflexion 6 12  Ankle plantarflexion wfl wfl  Ankle inversion Wfl wfl  Ankle eversion Wfl wfl   (Blank rows = not tested)  LOWER EXTREMITY MMT:MMT B LE's equal and wnl.  Single leg heel raises 6 to 8 reps each  MMT Right eval Left eval  Hip flexion    Hip extension    Hip abduction    Hip adduction    Hip internal rotation    Hip external rotation    Knee flexion    Knee extension     Ankle dorsiflexion    Ankle plantarflexion    Ankle inversion    Ankle eversion     (Blank rows = not tested)  LOWER EXTREMITY SPECIAL TESTS:  Na today  FUNCTIONAL TESTS:  Single leg stance R and L 3 sec each  GAIT: Distance walked: 82' within department, rested and self monitored her pulse oxygen, dropped to 88 x 2 todaybut rebounded Assistive device utilized:  supplemental on demand O2 Level of assistance: Complete Independence Comments: na   TODAY'S TREATMENT:  DATE:  03/06/23:  Manual: R sidelying , cross friction mass and myofascial release R medial plantarflexors Long sitting for post glides R subtalar jt, with R ankle dorsiflexion stretch Lunges on step R LE for mobilization with movement R ankle dorsiflexion, subtalar joint, one bout of 10 reps  Therapeutic exercise:   Black bar  standing ankle Df and PF 2 sets 10 Calf and soleus stretch on black bar airex toe raise and heel raises Airex semitandem standing R foot post, head movements up/down, side side 2 " step for L foot lateral heel taps to isolate eccentric control R plantarflexors , r lateral hip musculature, R VMO  Sitting RT foot on sit fit 4 way s 15 x each  02/28/23 Black bar Df and PF 2 sets 10 Calf and soleus stretch on black bar On foam mat marching, toe raise and heel raises Sitting RT foot on sit fit 4 way s 15 x each Red tband ankle 4 way 2 sets 10 Ankle PROM and jt mobs to increase ROM Calf stretching and DSTW- talked about DN for next session Step stretch for calf and soleous   02/23/23 Instructed in therex as described below, also demod use of roller /rolling pin to self masage R medial calf     PATIENT EDUCATION:  Education details: POC Person educated: Patient Education method: Consulting civil engineer, Media planner, Verbal cues, and Handouts Education comprehension: verbalized  understanding, returned demonstration, and verbal cues required  HOME EXERCISE PROGRAM: Access Code: [email or web address] URL: [email or web address] Date: 02/23/2023 Prepared by: Warren Lacy Joelee Snoke  Exercises - Seated Calf Stretch with Strap  - 3 x daily - 7 x weekly - 1 sets - 3 reps - 30 hold - Long Sitting Ankle Plantar Flexion with Resistance  - 1 x daily - 7 x weekly - 2 sets - 10 reps  ASSESSMENT:  CLINICAL IMPRESSION: Returned today for skilled physical therapy to address her function, strength, flexibility R ankle following R plantarflexor injury.  Palpation R plantarflexors not very tender today compared to initial evaluation, did not pursue dry needling due to less symptomatic today.  Progressed strength and balance.  Easily loses balance with low grade static and dynamic challenges.  R ankle dorsiflexion AAROM measured today improved since initial evaluation.continues to benefit from skilled PT to address her deficits  OBJECTIVE IMPAIRMENTS: cardiopulmonary status limiting activity, decreased activity tolerance, decreased balance, decreased endurance, difficulty walking, decreased ROM, decreased strength, hypomobility, and pain.   ACTIVITY LIMITATIONS: carrying, lifting, bending, standing, squatting, stairs, transfers, and locomotion level  PARTICIPATION LIMITATIONS: meal prep, laundry, and community activity  PERSONAL FACTORS: Fitness and 1-2 comorbidities: pulmonary fibrosis, h/o R hip and pelvic fracture  are also affecting patient's functional outcome.   REHAB POTENTIAL: Good  CLINICAL DECISION MAKING: Stable/uncomplicated  EVALUATION COMPLEXITY: Low   GOALS: Goals reviewed with patient? Yes  SHORT TERM GOALS: Target date: 03/09/23 I HEP Baseline: intiated today Goal status: IN PROGRESS  LONG TERM GOALS: Target date: 5/23/242.  1.Improve R ankle Rom for dorsiflexion from 6 to 10 degrees or greater Baseline: 6 Goal status: IN PROGRESS  2.  Improve unilateral balance  to 10 sec or greater on each leg for reduced fall risk Baseline: 3 sec each leg Goal status: IN PROGRESS  3.  Able to complete unilateral heel raise R LE from 6 reps to 15 ore greater Baseline:  Goal status: IN PROGRESS  PLAN:  PT FREQUENCY: 1-2x/week  PT DURATION: 12 weeks  PLANNED INTERVENTIONS: Therapeutic exercises, Therapeutic activity, Neuromuscular re-education, Balance training,  Gait training, Patient/Family education, Self Care, and Joint mobilization  PLAN FOR NEXT SESSION advance R ankle strengthening and balance   Lanina Larranaga L Viola Placeres, PT 03/06/2023, 4:52 PM [facility] at [place]. [place], [place], [address] Phone: [phone]   Fax:  [phone]  Patient Details  Name: Celsa Frogge MRN: [id number] Date of Birth: 09/10/1951 Referring Provider:  [name], MD  Encounter Date: 03/06/2023   Tim Lair, PT 03/06/2023, 4:52 PM  [facility] at [place]. [place], [place], [address] Phone: [phone]   Fax:  [phone]

## 2023-03-08 ENCOUNTER — Other Ambulatory Visit: Payer: Self-pay

## 2023-03-08 ENCOUNTER — Ambulatory Visit: Payer: Medicare Other

## 2023-03-08 DIAGNOSIS — R2689 Other abnormalities of gait and mobility: Secondary | ICD-10-CM | POA: Diagnosis not present

## 2023-03-08 DIAGNOSIS — M79661 Pain in right lower leg: Secondary | ICD-10-CM

## 2023-03-08 DIAGNOSIS — R262 Difficulty in walking, not elsewhere classified: Secondary | ICD-10-CM | POA: Diagnosis not present

## 2023-03-08 NOTE — Therapy (Signed)
OUTPATIENT PHYSICAL THERAPY LOWER EXTREMITY   Patient Name: Patty [name] MRN: [id number] DOB:09-26-1951, 72 y.o., female Today's Date: 03/08/2023  END OF SESSION:  PT End of Session - 03/08/23 1352     Visit Number 4    Date for PT Re-Evaluation 05/18/23    Progress Note Due on Visit 10    PT Start Time 1350    PT Stop Time 1430    PT Time Calculation (min) 40 min    Activity Tolerance Patient tolerated treatment well    Behavior During Therapy [facility] for tasks assessed/performed               Past Medical History:  Diagnosis Date   Abnormal nuclear stress test    a. 07/2017: cath with no CAD   Anxiety    Aortic atherosclerosis (HCC)    Arthritis    Chronic diastolic CHF (congestive heart failure) (HCC)    Coronary artery calcification seen on CT scan    Crohn disease (HCC)    DVT (deep venous thrombosis) (HCC)    Dyspnea    Grade I diastolic dysfunction    Hyperlipidemia    ILD (interstitial lung disease) (HCC)    OSA on CPAP    CPAP    AND O2   Pernicious anemia    Polycythemia vera ([place])    Pulmonary embolism ([place]) 2010   s/p hip surgery , reports this was never truly confirmed    Pulmonary hypertension ([place])    Sleep disorder 01/10/2020   Uses klonopin for sleep since 2014; sleep walking. And anxiety   Past Surgical History:  Procedure Laterality Date    RIGHT BREAST LUMPECTOMY WITH RADIOACTIVE SEED LOCALIZATION (Right Breast)  02/04/2021   BIOPSY  09/30/2019   Procedure: BIOPSY;  Surgeon: Jerene Bears, MD;  [facility];  Service: Gastroenterology;;   BREAST LUMPECTOMY WITH RADIOACTIVE SEED LOCALIZATION Right 02/04/2021   Procedure: RIGHT BREAST LUMPECTOMY WITH RADIOACTIVE SEED LOCALIZATION;  Surgeon: Coralie Keens, MD;  Location: [facility];  Service: General;  Laterality: Right;   CARDIAC CATHETERIZATION     COLON RESECTION  1993   COLONOSCOPY WITH PROPOFOL N/A 09/30/2019   Procedure: COLONOSCOPY WITH PROPOFOL;  Surgeon: Jerene Bears, MD;   [facility];  Service: Gastroenterology;  Laterality: N/A;   HIP ARTHROPLASTY Right    x 2, initial right THA, then subsequent right THA revision    LEFT HEART CATH AND CORONARY ANGIOGRAPHY N/A 07/28/2017   Procedure: Left Heart Cath and Coronary Angiography;  Surgeon: Burnell Blanks, MD;  Location: [facility] [place] [facility];  Service: Cardiovascular;  Laterality: N/A;   POLYPECTOMY  09/30/2019   Procedure: POLYPECTOMY;  Surgeon: Jerene Bears, MD;  [facility];  Service: Gastroenterology;;   RIGHT HEART CATH N/A 12/07/2020   Procedure: RIGHT HEART CATH;  Surgeon: Sherren Mocha, MD;  Location: [facility] [facility];  Service: Cardiovascular;  Laterality: N/A;   Patient Active Problem List   Diagnosis Date Noted   Pulmonary emphysema with fibrosis of lung ([place]) 01/31/2022   Sclerosing adenosis of right breast 02/04/2021   Prediabetes 02/01/2021   Pulmonary hypertension, unspecified ([place]) 12/07/2020   Osteopenia 08/05/2020   Adjustment disorder with anxiety 04/29/2020   Sleep disorder 01/10/2020   Chronic allergic rhinitis 01/10/2020   Pernicious anemia 01/10/2020   Chronic prescription benzodiazepine use 01/10/2020   Benign neoplasm of rectum    Dyslipidemia 09/24/2019   Morbid obesity ([place]) 09/24/2019   History of DVT (deep vein  thrombosis), with PE 07/25/2019   OSA on CPAP    Crohn disease ([place]) 06/15/2017   ILD (interstitial lung disease) ([place]) 05/29/2017   Chronic respiratory failure with hypoxia ([place]) 05/29/2017   History of polycythemia vera 11/12/2014    PCP: Billey Chang, MD  REFERRING PROVIDER: Pedro Earls MD  REFERRING DIAG: R lower leg pain  THERAPY DIAG:  Pain in right lower leg  Impairment of balance  Difficulty in walking, not elsewhere classified  Rationale for Evaluation and Treatment: Rehabilitation  ONSET DATE: August 2023  SUBJECTIVE:   SUBJECTIVE STATEMENT: Still achy R calf, not so much on the outside of the lower leg  any more.  PERTINENT HISTORY: Had new shoes, reached forward to the ground, shoes were taller, strained my R calf PAIN:  Are you having pain? Yes: Pain location: R lateral lower leg Pain description: aches, deep ache Aggravating factors: sit to stand Relieving factors: rests  PRECAUTIONS: Other: using on demand O2, checked her own O2 sats throughout eval  WEIGHT BEARING RESTRICTIONS: No  FALLS:  Has patient fallen in last 6 months? No  LIVING ENVIRONMENT: Lives with: lives alone Lives in: House/apartment Stairs: Yes: Internal: 2 steps; can reach both Has following equipment at home: Single point cane2  OCCUPATION: retired  PLOF: Independent  PATIENT GOALS: to be more I, stop pain R leg, avoid falls   NEXT MD VISIT: 05/25/23  OBJECTIVE:   DIAGNOSTIC FINDINGS: na, patient reports x rays - R ankle  PATIENT SURVEYS:  LEFS 30/80  62.5% disability COGNITION: Overall cognitive status: Within functional limits for tasks assessed     SENSATION: WFL  EDEMA:  None noted  MUSCLE LENGTH: Hamstrings: Right wfl deg; Left wfl deg   POSTURE:  R hip ER in standing, overpronates B ankles,with gait, greater R than L R medial gastrocs atrophied compared to L  PALPATION: Tender , thickened R medial gastrocs muscle belly, non tender post knee tender R fibular head  LOWER EXTREMITY ROM:  Passive ROM Right eval Left eval  Hip flexion 90   Hip extension    Hip abduction    Hip adduction    Hip internal rotation    Hip external rotation    Knee flexion    Knee extension    Ankle dorsiflexion 6 12  Ankle plantarflexion wfl wfl  Ankle inversion Wfl wfl  Ankle eversion Wfl wfl   (Blank rows = not tested)  LOWER EXTREMITY MMT:MMT B LE's equal and wnl.  Single leg heel raises 6 to 8 reps each  MMT Right eval Left eval  Hip flexion    Hip extension    Hip abduction    Hip adduction    Hip internal rotation    Hip external rotation    Knee flexion    Knee extension     Ankle dorsiflexion    Ankle plantarflexion    Ankle inversion    Ankle eversion     (Blank rows = not tested)  LOWER EXTREMITY SPECIAL TESTS:  Na today  FUNCTIONAL TESTS:  Single leg stance R and L 3 sec each  GAIT: Distance walked: 64' within department, rested and self monitored her pulse oxygen, dropped to 88 x 2 todaybut rebounded Assistive device utilized:  supplemental on demand O2 Level of assistance: Complete Independence Comments: na   TODAY'S TREATMENT:  DATE:  Manual: R sidelying , cross friction mass and myofascial release R medial plantarflexors  Therapeutic exercise:   On adjustable wedge; in ll bars for B plantarflexor stretch and B toe raises 15x Staggered stance L foot on airex in ll bars, R foot on ground, with up/down head movements 15x Step for/ back, in ll bars, R foot planted on airex 15x 2 " step for L foot lateral heel taps to isolate eccentric control R plantarflexors , r lateral hip musculature, R VMO 15x Sitting RT foot on sit fit 4 way s 15 x each 15x Standing B toe raises with ball between ankles 15x, ll bars Seated theraband hamstring curls blue band, 30 reps  03/06/23:  Manual: R sidelying , cross friction mass and myofascial release R medial plantarflexors Long sitting for post glides R subtalar jt, with R ankle dorsiflexion stretch Lunges on step R LE for mobilization with movement R ankle dorsiflexion, subtalar joint, one bout of 10 reps  Therapeutic exercise:   Black bar  standing ankle Df and PF 2 sets 10 Calf and soleus stretch on black bar airex toe raise and heel raises Airex semitandem standing R foot post, head movements up/down, side side 2 " step for L foot lateral heel taps to isolate eccentric control R plantarflexors , r lateral hip musculature, R VMO  Sitting RT foot on sit fit 4 way s 15 x  each  02/28/23 Black bar Df and PF 2 sets 10 Calf and soleus stretch on black bar On foam mat marching, toe raise and heel raises Sitting RT foot on sit fit 4 way s 15 x each Red tband ankle 4 way 2 sets 10 Ankle PROM and jt mobs to increase ROM Calf stretching and DSTW- talked about DN for next session Step stretch for calf and soleous   02/23/23 Instructed in therex as described below, also demod use of roller /rolling pin to self masage R medial calf     PATIENT EDUCATION:  Education details: POC Person educated: Patient Education method: Consulting civil engineer, Media planner, Verbal cues, and Handouts Education comprehension: verbalized understanding, returned demonstration, and verbal cues required  HOME EXERCISE PROGRAM: Access Code: [email or web address] URL: [email or web address] Date: 02/23/2023 Prepared by: Warren Lacy Dallas Torok  Exercises - Seated Calf Stretch with Strap  - 3 x daily - 7 x weekly - 1 sets - 3 reps - 30 hold - Long Sitting Ankle Plantar Flexion with Resistance  - 1 x daily - 7 x weekly - 2 sets - 10 reps  ASSESSMENT:  CLINICAL IMPRESSION: Returned today for skilled physical therapy to address her function, strength, flexibility R ankle following R plantarflexor injury.  Palpation R plantarflexors mild thickening and tenderness over medial head of gastrocs. Progressed strength and balance again with further challenges in the ll bars.  Improved in all areas with pain tolerance with activity, also better balance.  Continues to benefit from skilled PT to address her deficits.  OBJECTIVE IMPAIRMENTS: cardiopulmonary status limiting activity, decreased activity tolerance, decreased balance, decreased endurance, difficulty walking, decreased ROM, decreased strength, hypomobility, and pain.   ACTIVITY LIMITATIONS: carrying, lifting, bending, standing, squatting, stairs, transfers, and locomotion level  PARTICIPATION LIMITATIONS: meal prep, laundry, and community  activity  PERSONAL FACTORS: Fitness and 1-2 comorbidities: pulmonary fibrosis, h/o R hip and pelvic fracture  are also affecting patient's functional outcome.   REHAB POTENTIAL: Good  CLINICAL DECISION MAKING: Stable/uncomplicated  EVALUATION COMPLEXITY: Low   GOALS: Goals reviewed with patient? Yes  SHORT TERM GOALS: Target date:  03/09/23 I HEP Baseline: intiated today Goal status: IN PROGRESS  LONG TERM GOALS: Target date: 5/23/242.  1.Improve R ankle Rom for dorsiflexion from 6 to 10 degrees or greater Baseline: 6 Goal status: IN PROGRESS  2.  Improve unilateral balance to 10 sec or greater on each leg for reduced fall risk Baseline: 3 sec each leg Goal status: IN PROGRESS  3.  Able to complete unilateral heel raise R LE from 6 reps to 15 ore greater Baseline:  Goal status: IN PROGRESS  PLAN:  PT FREQUENCY: 1-2x/week  PT DURATION: 12 weeks  PLANNED INTERVENTIONS: Therapeutic exercises, Therapeutic activity, Neuromuscular re-education, Balance training, Gait training, Patient/Family education, Self Care, and Joint mobilization  PLAN FOR NEXT SESSION advance R ankle strengthening and balance   Nitish Roes L Michille Mcelrath, PT 03/08/2023, 2:47 PM [facility] at [place]. [place], [place], [address] Phone: [phone]   Fax:  [phone]  Patient Details  Name: Patty [name] MRN: [id number] Date of Birth: 06/06/1951 Referring Provider:  [name], MD  Encounter Date: 03/08/2023   Tim Lair, PT 03/08/2023, 2:47 PM  [facility] at [place]. [place], [place], [address] Phone: [phone]   Fax:  [phone]

## 2023-03-13 ENCOUNTER — Ambulatory Visit: Payer: Medicare Other

## 2023-03-13 ENCOUNTER — Other Ambulatory Visit: Payer: Self-pay

## 2023-03-13 DIAGNOSIS — R262 Difficulty in walking, not elsewhere classified: Secondary | ICD-10-CM

## 2023-03-13 DIAGNOSIS — R2689 Other abnormalities of gait and mobility: Secondary | ICD-10-CM

## 2023-03-13 DIAGNOSIS — M79661 Pain in right lower leg: Secondary | ICD-10-CM | POA: Diagnosis not present

## 2023-03-13 NOTE — Therapy (Signed)
OUTPATIENT PHYSICAL THERAPY LOWER EXTREMITY   Patient Name: Patty [name] MRN: [id number] DOB:1951-07-13, 72 y.o., female Today's Date: 03/13/2023  END OF SESSION:  PT End of Session - 03/13/23 1736     Visit Number 5    Date for PT Re-Evaluation 05/18/23    PT Start Time 1345    PT Stop Time 1430    PT Time Calculation (min) 45 min    Activity Tolerance Patient tolerated treatment well    Behavior During Therapy [facility] for tasks assessed/performed               Past Medical History:  Diagnosis Date   Abnormal nuclear stress test    a. 07/2017: cath with no CAD   Anxiety    Aortic atherosclerosis (HCC)    Arthritis    Chronic diastolic CHF (congestive heart failure) (HCC)    Coronary artery calcification seen on CT scan    Crohn disease ([place])    DVT (deep venous thrombosis) (HCC)    Dyspnea    Grade I diastolic dysfunction    Hyperlipidemia    ILD (interstitial lung disease) (HCC)    OSA on CPAP    CPAP    AND O2   Pernicious anemia    Polycythemia vera ([place])    Pulmonary embolism ([place]) 2010   s/p hip surgery , reports this was never truly confirmed    Pulmonary hypertension ([place])    Sleep disorder 01/10/2020   Uses klonopin for sleep since 2014; sleep walking. And anxiety   Past Surgical History:  Procedure Laterality Date    RIGHT BREAST LUMPECTOMY WITH RADIOACTIVE SEED LOCALIZATION (Right Breast)  02/04/2021   BIOPSY  09/30/2019   Procedure: BIOPSY;  Surgeon: Jerene Bears, MD;  Location: [facility];  Service: Gastroenterology;;   BREAST LUMPECTOMY WITH RADIOACTIVE SEED LOCALIZATION Right 02/04/2021   Procedure: RIGHT BREAST LUMPECTOMY WITH RADIOACTIVE SEED LOCALIZATION;  Surgeon: Coralie Keens, MD;  Location: [place];  Service: General;  Laterality: Right;   CARDIAC CATHETERIZATION     COLON RESECTION  1993   COLONOSCOPY WITH PROPOFOL N/A 09/30/2019   Procedure: COLONOSCOPY WITH PROPOFOL;  Surgeon: Jerene Bears, MD;  Location: [facility];   Service: Gastroenterology;  Laterality: N/A;   HIP ARTHROPLASTY Right    x 2, initial right THA, then subsequent right THA revision    LEFT HEART CATH AND CORONARY ANGIOGRAPHY N/A 07/28/2017   Procedure: Left Heart Cath and Coronary Angiography;  Surgeon: Burnell Blanks, MD;  Location: [place];  Service: Cardiovascular;  Laterality: N/A;   POLYPECTOMY  09/30/2019   Procedure: POLYPECTOMY;  Surgeon: Jerene Bears, MD;  Location: [facility];  Service: Gastroenterology;;   RIGHT HEART CATH N/A 12/07/2020   Procedure: RIGHT HEART CATH;  Surgeon: Sherren Mocha, MD;  Location: [place] [facility];  Service: Cardiovascular;  Laterality: N/A;   Patient Active Problem List   Diagnosis Date Noted   Pulmonary emphysema with fibrosis of lung ([place]) 01/31/2022   Sclerosing adenosis of right breast 02/04/2021   Prediabetes 02/01/2021   Pulmonary hypertension, unspecified ([place]) 12/07/2020   Osteopenia 08/05/2020   Adjustment disorder with anxiety 04/29/2020   Sleep disorder 01/10/2020   Chronic allergic rhinitis 01/10/2020   Pernicious anemia 01/10/2020   Chronic prescription benzodiazepine use 01/10/2020   Benign neoplasm of rectum    Dyslipidemia 09/24/2019   Morbid obesity ([place]) 09/24/2019   History of DVT (deep vein thrombosis), with PE 07/25/2019   OSA on CPAP  Crohn disease ([place]) 06/15/2017   ILD (interstitial lung disease) ([place]) 05/29/2017   Chronic respiratory failure with hypoxia ([place]) 05/29/2017   History of polycythemia vera 11/12/2014    PCP: Billey Chang, MD  REFERRING PROVIDER: Pedro Earls MD  REFERRING DIAG: R lower leg pain  THERAPY DIAG:  Pain in right lower leg  Impairment of balance  Difficulty in walking, not elsewhere classified  Rationale for Evaluation and Treatment: Rehabilitation  ONSET DATE: August 2023  SUBJECTIVE:   SUBJECTIVE STATEMENT: Still achy R calf, not so much on the outside of the lower leg any more, woke up the other  day with numb/ weak feeling R lower leg , not sure why PERTINENT HISTORY: Had new shoes, reached forward to the ground, shoes were taller, strained my R calf PAIN:  Are you having pain? Yes: Pain location: R lateral lower leg Pain description: aches, deep ache Aggravating factors: sit to stand Relieving factors: rests  PRECAUTIONS: Other: using on demand O2, checked her own O2 sats throughout eval  WEIGHT BEARING RESTRICTIONS: No  FALLS:  Has patient fallen in last 6 months? No  LIVING ENVIRONMENT: Lives with: lives alone Lives in: House/apartment Stairs: Yes: Internal: 2 steps; can reach both Has following equipment at home: Single point cane2  OCCUPATION: retired  PLOF: Independent  PATIENT GOALS: to be more I, stop pain R leg, avoid falls   NEXT MD VISIT: 05/25/23  OBJECTIVE:   DIAGNOSTIC FINDINGS: na, patient reports x rays - R ankle  PATIENT SURVEYS:  LEFS 30/80  62.5% disability COGNITION: Overall cognitive status: Within functional limits for tasks assessed     SENSATION: WFL  EDEMA:  None noted  MUSCLE LENGTH: Hamstrings: Right wfl deg; Left wfl deg   POSTURE:  R hip ER in standing, overpronates B ankles,with gait, greater R than L R medial gastrocs atrophied compared to L  PALPATION: Tender , thickened R medial gastrocs muscle belly, non tender post knee tender R fibular head  LOWER EXTREMITY ROM:  Passive ROM Right eval Left eval  Hip flexion 90   Hip extension    Hip abduction    Hip adduction    Hip internal rotation    Hip external rotation    Knee flexion    Knee extension    Ankle dorsiflexion 6 12  Ankle plantarflexion wfl wfl  Ankle inversion Wfl wfl  Ankle eversion Wfl wfl   (Blank rows = not tested)  LOWER EXTREMITY MMT:MMT B LE's equal and wnl.  Single leg heel raises 6 to 8 reps each  MMT Right eval Left eval  Hip flexion    Hip extension    Hip abduction    Hip adduction    Hip internal rotation    Hip external  rotation    Knee flexion    Knee extension    Ankle dorsiflexion    Ankle plantarflexion    Ankle inversion    Ankle eversion     (Blank rows = not tested)  LOWER EXTREMITY SPECIAL TESTS:  Na today  FUNCTIONAL TESTS:  Single leg stance R and L 3 sec each  GAIT: Distance walked: 26' within department, rested and self monitored her pulse oxygen, dropped to 88 x 2 todaybut rebounded Assistive device utilized:  supplemental on demand O2 Level of assistance: Complete Independence Comments: na   TODAY'S TREATMENT:  DATE:  03/13/23:  Manual: Long sitting for calcaneal traction , A/P glides subtalar jt, stretching into R ankle dorsiflexion R side lying for cross friction massage R medial gastrocnemius blue disc  Therapeutic exercise: Instructed in self Mobilization with movement, R foot on step, forward lunges with theraband placed across R ant ankle Standing for B plantarflexor stretch on wooden adjustable wedge Standing for toe raises with fore foot on black bar Standing for multiplanar R ankle AROM in closed chain/ weight bearing position on    03/08/23: Manual: R sidelying , cross friction mass and myofascial release R medial plantarflexors  Therapeutic exercise:   On adjustable wedge; in ll bars for B plantarflexor stretch and B toe raises 15x Staggered stance L foot on airex in ll bars, R foot on ground, with up/down head movements 15x Step for/ back, in ll bars, R foot planted on airex 15x 2 " step for L foot lateral heel taps to isolate eccentric control R plantarflexors , r lateral hip musculature, R VMO 15x Sitting RT foot on sit fit 4 way s 15 x each 15x Standing B toe raises with ball between ankles 15x, ll bars Seated theraband hamstring curls blue band, 30 reps  03/06/23:  Manual: R sidelying , cross friction mass and myofascial release R  medial plantarflexors Long sitting for post glides R subtalar jt, with R ankle dorsiflexion stretch Lunges on step R LE for mobilization with movement R ankle dorsiflexion, subtalar joint, one bout of 10 reps  Therapeutic exercise:   Black bar  standing ankle Df and PF 2 sets 10 Calf and soleus stretch on black bar airex toe raise and heel raises Airex semitandem standing R foot post, head movements up/down, side side 2 " step for L foot lateral heel taps to isolate eccentric control R plantarflexors , r lateral hip musculature, R VMO  Sitting RT foot on sit fit 4 way s 15 x each  02/28/23 Black bar Df and PF 2 sets 10 Calf and soleus stretch on black bar On foam mat marching, toe raise and heel raises Sitting RT foot on sit fit 4 way s 15 x each Red tband ankle 4 way 2 sets 10 Ankle PROM and jt mobs to increase ROM Calf stretching and DSTW- talked about DN for next session Step stretch for calf and soleous   02/23/23 Instructed in therex as described below, also demod use of roller /rolling pin to self masage R medial calf     PATIENT EDUCATION:  Education details: POC Person educated: Patient Education method: Consulting civil engineer, Media planner, Verbal cues, and Handouts Education comprehension: verbalized understanding, returned demonstration, and verbal cues required  HOME EXERCISE PROGRAM: Access Code: [email or web address] URL: [email or web address] Date: 02/23/2023 Prepared by: Warren Lacy Shailene Demonbreun  Exercises - Seated Calf Stretch with Strap  - 3 x daily - 7 x weekly - 1 sets - 3 reps - 30 hold - Long Sitting Ankle Plantar Flexion with Resistance  - 1 x daily - 7 x weekly - 2 sets - 10 reps  ASSESSMENT:  CLINICAL IMPRESSION: Returned today for skilled physical therapy to address her function, strength, flexibility R ankle following R plantarflexor injury. Reporting intermittent numbness tingling R ankle, foot, occurs usually when getting up in am.  Discussed various positions which  might be contributing to compression of R fibular head .  We discussed possibly reducing the frequency and intensity of her exercises.stiff for R ankle dorsiflexion today upon arrival, improved after manual stretching and  therex today.Continues to  benefit from skilled PT to address her deficits.  OBJECTIVE IMPAIRMENTS: cardiopulmonary status limiting activity, decreased activity tolerance, decreased balance, decreased endurance, difficulty walking, decreased ROM, decreased strength, hypomobility, and pain.   ACTIVITY LIMITATIONS: carrying, lifting, bending, standing, squatting, stairs, transfers, and locomotion level  PARTICIPATION LIMITATIONS: meal prep, laundry, and community activity  PERSONAL FACTORS: Fitness and 1-2 comorbidities: pulmonary fibrosis, h/o R hip and pelvic fracture  are also affecting patient's functional outcome.   REHAB POTENTIAL: Good  CLINICAL DECISION MAKING: Stable/uncomplicated  EVALUATION COMPLEXITY: Low   GOALS: Goals reviewed with patient? Yes  SHORT TERM GOALS: Target date: 03/09/23 I HEP Baseline: intiated today Goal status: IN PROGRESS  LONG TERM GOALS: Target date: 5/23/242.  1.Improve R ankle Rom for dorsiflexion from 6 to 10 degrees or greater Baseline: 6 Goal status: IN PROGRESS  2.  Improve unilateral balance to 10 sec or greater on each leg for reduced fall risk Baseline: 3 sec each leg Goal status: IN PROGRESS  3.  Able to complete unilateral heel raise R LE from 6 reps to 15 ore greater Baseline:  Goal status: IN PROGRESS  PLAN:  PT FREQUENCY: 1-2x/week  PT DURATION: 12 weeks  PLANNED INTERVENTIONS: Therapeutic exercises, Therapeutic activity, Neuromuscular re-education, Balance training, Gait training, Patient/Family education, Self Care, and Joint mobilization  PLAN FOR NEXT SESSION advance R ankle strengthening and balance   Nai Dasch L Jaydian Santana, PT 03/13/2023, 5:46 PM [facility] [place]. [place], [place], [address] Phone: [phone]   Fax:  [phone]  Patient Details  Name: Patty [name] MRN: [id number] Date of Birth: 1951-09-08 Referring Provider:  [name], MD  Encounter Date: 03/13/2023   Tim Lair, PT 03/13/2023, 5:46 PM  [facility] at [place]. [place], [place], [address] Phone: [phone]   Fax:  [phone]

## 2023-03-14 NOTE — Progress Notes (Signed)
Cardiology Office Note    Date:  03/20/2023   ID:  Patty [name], DOB 1951/11/03, MRN [id number]  PCP:  [name], MD  Cardiologist:  Jenkins Rouge, MD  Electrophysiologist:  None   Chief Complaint: f/u CHF  History of Present Illness:   Patty [name] is a 72 y.o. female with history of ILD on with chronic respiratory failure home O2 with associated emphysema on CT followed by pulmonology, anxiety, arthritis, pulmonary HTN, chronic diastolic CHF, Crohn's disease, HLD (managed by primary care), polycycthema vera, pre-DM, pernicious anemia, OSA (followed by pulm), coronary/aortic atherosclerosis by CT (negative cath 2018) who is seen for follow-up.   Echo 08/18/22 EF [id number] grade 1 diastolic Normal RV mild TR  Myoview stress test 10/22/20 which showed no ischemia or infarct.  [place] 11/2020 showing moderate pulmonary HTN. RA 13/12 mean 9 mmHg RV 48/8, EDP 12 PA 50/29 mean 38 PCWP 22/20 mean 20 O2 saturations: PA 62, Ao 97, SVC 64 CO 4.26 L/m, CI 1.96 Trans-pulmonic gradient 18 mmHg PVR 4.2 Woods Units  She was subsequently started on Esbriet and Tyvaso by pulmonology. She did not tolerate the Esbriet but feels the Tyvaso has helped. Repeat CT chest 03/2022 showed mild scarring of bilateral lung bases, enlargement of the PA, scattered coronary/aortic atherosclerosis and possible cirrhosis. She has elected not to pursue any evaluation for the liver changes. ake her Lasix more regularly in the summer in the hotter weather.   Fixated on recent sewer problems with church behind her house Likely has made her breathing worse On oxygen all the time now Taking lasix on occasion but not needing it daily   She confirms DNR No family    Cardiology Studies:   Studies reviewed are outlined and summarized above. Reports included below if pertinent.   [place] 11/2020 Hemodynamic findings consistent with moderate pulmonary hypertension.   RA 13/12 mean 9 mmHg RV 48/8, EDP 12 PA 50/29  mean 38 PCWP 22/20 mean 20   O2 saturations: PA 62, Ao 97, SVC 64   CO 4.26 L/m, CI 1.96 Trans-pulmonic gradient 18 mmHg PVR 4.2 [place] Units   2D Echo 06/2020  1. Left ventricular ejection fraction, by estimation, is 60 to 65%. The  left ventricle has normal function. The left ventricle has no regional  wall motion abnormalities. There is moderate concentric left ventricular  hypertrophy. Left ventricular  diastolic parameters are consistent with Grade I diastolic dysfunction  (impaired relaxation).   2. Although TAPSE is normal, visually RVF appears reduced. The right  ventricular size is normal.   3. The mitral valve is normal in structure. No evidence of mitral valve  regurgitation. No evidence of mitral stenosis.   4. The aortic valve is normal in structure. Aortic valve regurgitation is  not visualized. No aortic stenosis is present.   5. The inferior vena cava is normal in size with greater than 50%  respiratory variability, suggesting right atrial pressure of 3 mmHg.     L+RHC 2018 1. No angiographic evidence of CAD 2. Elevated filling pressures   Recommendations: No further ischemic workup. Her filling pressures are elevated. She may benefit from diuretic therapy if she becomes symptomatic.     Past Medical History:  Diagnosis Date   Abnormal nuclear stress test    a. 07/2017: cath with no CAD   Anxiety    Aortic atherosclerosis (HCC)    Arthritis    Chronic diastolic CHF (congestive heart failure) (HCC)    Coronary artery  calcification seen on CT scan    Crohn disease (HCC)    DVT (deep venous thrombosis) (HCC)    Dyspnea    Grade I diastolic dysfunction    Hyperlipidemia    ILD (interstitial lung disease) (HCC)    OSA on CPAP    CPAP    AND O2   Pernicious anemia    Polycythemia vera ([place])    Pulmonary embolism ([place]) 2010   s/p hip surgery , reports this was never truly confirmed    Pulmonary hypertension ([place])    Sleep disorder 01/10/2020   Uses  klonopin for sleep since 2014; sleep walking. And anxiety    Past Surgical History:  Procedure Laterality Date    RIGHT BREAST LUMPECTOMY WITH RADIOACTIVE SEED LOCALIZATION (Right Breast)  02/04/2021   BIOPSY  09/30/2019   Procedure: BIOPSY;  Surgeon: Jerene Bears, MD;  Location: [facility];  Service: Gastroenterology;;   BREAST LUMPECTOMY WITH RADIOACTIVE SEED LOCALIZATION Right 02/04/2021   Procedure: RIGHT BREAST LUMPECTOMY WITH RADIOACTIVE SEED LOCALIZATION;  Surgeon: Coralie Keens, MD;  Location: [facility];  Service: General;  Laterality: Right;   CARDIAC CATHETERIZATION     COLON RESECTION  1993   COLONOSCOPY WITH PROPOFOL N/A 09/30/2019   Procedure: COLONOSCOPY WITH PROPOFOL;  Surgeon: Jerene Bears, MD;  Location: [facility];  Service: Gastroenterology;  Laterality: N/A;   HIP ARTHROPLASTY Right    x 2, initial right THA, then subsequent right THA revision    LEFT HEART CATH AND CORONARY ANGIOGRAPHY N/A 07/28/2017   Procedure: Left Heart Cath and Coronary Angiography;  Surgeon: Burnell Blanks, MD;  Location: [facility];  Service: Cardiovascular;  Laterality: N/A;   POLYPECTOMY  09/30/2019   Procedure: POLYPECTOMY;  Surgeon: Jerene Bears, MD;  Location: [facility];  Service: Gastroenterology;;   RIGHT HEART CATH N/A 12/07/2020   Procedure: RIGHT HEART CATH;  Surgeon: Sherren Mocha, MD;  Location: [facility];  Service: Cardiovascular;  Laterality: N/A;    Current Medications: Current Meds  Medication Sig   albuterol (VENTOLIN HFA) 108 (90 Base) MCG/ACT inhaler Inhale 2 puffs into the lungs every 6 (six) hours as needed for wheezing or shortness of breath.   amoxicillin (AMOXIL) 500 MG capsule Take 4 capsules (2,000 mg total) by mouth once as needed for up to 1 dose. Before dental procedures   aspirin EC 81 MG tablet Take 81 mg by mouth daily.   Calcium Carbonate-Vit D-Min (CALCIUM 1200) 1200-1000 MG-UNIT CHEW Chew 1 tablet by mouth daily.    CANNABIDIOL PO Take 1 Dose by mouth 2 (two) times daily as needed (anxiety/sleep.). CBD Oil   cetirizine (ZYRTEC) 10 MG tablet Take 10 mg by mouth daily as needed (sinus/allergies.).   cetirizine-pseudoephedrine (ZYRTEC-D) 5-120 MG tablet Take 1 tablet by mouth daily as needed (sinus headaches.).   clonazePAM (KLONOPIN) 0.5 MG tablet Take 0.5-1 tablets (0.25-0.5 mg total) by mouth daily as needed for anxiety.   cyanocobalamin (VITAMIN B12) 1000 MCG/ML injection Inject 1 mL (1,000 mcg total) into the muscle every 30 (thirty) days.   FLUoxetine (PROZAC) 20 MG capsule Take 1 capsule (20 mg total) by mouth daily.   fluticasone (FLONASE) 50 MCG/ACT nasal spray Place 1-2 sprays into both nostrils daily as needed for allergies or rhinitis.   furosemide (LASIX) 20 MG tablet Take 1 tablet (20 mg total) by mouth as needed for fluid.   Glycerin-Polysorbate 80 (REFRESH DRY EYE THERAPY OP) Place 1 drop into both eyes daily.  hydrocortisone (PROCTOZONE-HC) 2.5 % rectal cream Place 1 application rectally 2 (two) times daily as needed (Crohn's flare).   ibuprofen (ADVIL) 200 MG tablet Take 400-800 mg by mouth every 8 (eight) hours as needed (pain.).   mesalamine (PENTASA) 250 MG CR capsule Take 250-500 mg by mouth 4 (four) times daily as needed (crohn's disease flare ups).   Multiple Vitamins-Minerals (ICAPS AREDS 2 PO) Take 1 tablet by mouth daily.   prednisoLONE acetate (PRED FORTE) 1 % ophthalmic suspension Place 1 drop into both eyes 2 (two) times daily as needed (Uveitis).   Probiotic Product (PROBIOTIC PO) Take 1 capsule by mouth 2 (two) times a week. As needed   SYRINGE-NEEDLE, DISP, 3 ML (LUER LOCK SAFETY SYRINGES) 25G X 1" 3 ML MISC USE AS DIRECTED   traMADol (ULTRAM) 50 MG tablet Take 1 tablet (50 mg total) by mouth every 6 (six) hours as needed.   traZODone (DESYREL) 50 MG tablet TAKE 0.5-1 TABLETS BY MOUTH AT BEDTIME AS NEEDED FOR SLEEP.   Treprostinil (TYVASO REFILL) 0.6 MG/ML SOLN Inhale 54 mcg into  the lungs in the morning, at noon, in the evening, and at bedtime.      Allergies:   Tizanidine hcl, Victoza [liraglutide], Metformin and related, Onion, Lasix [furosemide], Pirfenidone, Potassium-containing compounds, Prednisone, Sulfa drugs cross reactors, and Trelegy ellipta [fluticasone-umeclidin-vilant]   Social History   Socioeconomic History   Marital status: Single    Spouse name: Not on file   Number of children: 0   Years of education: Not on file   Highest education level: Not on file  Occupational History   Occupation: retired  Tobacco Use   Smoking status: Former    Packs/day: 1.00    Years: 45.00    Additional pack years: 0.00    Total pack years: 45.00    Types: Cigarettes    Quit date: 12/26/2008    Years since quitting: 14.2   Smokeless tobacco: Never  Vaping Use   Vaping Use: Never used  Substance and Sexual Activity   Alcohol use: Yes    Comment: occ   Drug use: No   Sexual activity: Not Currently  Other Topics Concern   Not on file  Social History Narrative   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    Social Determinants of Health   Financial Resource Strain: Low Risk  (12/03/2020)   Overall Financial Resource Strain (CARDIA)    Difficulty of Paying Living Expenses: Not hard at all  Food Insecurity: No Food Insecurity (10/13/2022)   Hunger Vital Sign    Worried About Running Out of Food in the Last Year: Never true    Ran Out of Food in the Last Year: Never true  Transportation Needs: No Transportation Needs (10/13/2022)   PRAPARE - Hydrologist (Medical): No    Lack of Transportation (Non-Medical): No  Physical Activity: Insufficiently Active (12/03/2020)   Exercise Vital Sign    Days of Exercise per Week: 4 days    Minutes of Exercise per Session: 30 min  Stress: Stress Concern Present (12/03/2020)   Hurt     Feeling of Stress : To some extent  Social Connections: Socially Isolated (12/03/2020)   Social Connection and Isolation Panel [NHANES]    Frequency of Communication with Friends and Family: Twice a week    Frequency of Social Gatherings with Friends and Family: Never  Attends Religious Services: 1 to 4 times per year    Active Member of Clubs or Organizations: No    Attends Archivist Meetings: Never    Marital Status: Never married     Family History:  The patient's family history includes Anxiety disorder in her father; COPD in her brother; Diabetes Mellitus II in her brother and father; Glaucoma in her brother and mother; Heart disease in her brother; High Cholesterol in her mother; High blood pressure in her mother; Non-Hodgkin's lymphoma in her brother; Sleep apnea in her father and mother. There is no history of Colon cancer, Esophageal cancer, Pancreatic cancer, Stomach cancer, or Liver disease.  ROS:   Please see the history of present illness.  All other systems are reviewed and otherwise negative.    EKG(s)/Additional Labs   EKG:  EKG is ordered today, personally reviewed, demonstrating NSR 88bpm, LAD, poor R wave progression with generally lower voltage  Recent Labs: 11/25/2022: ALT 17; BUN 19; Creatinine, Ser 0.78; Hemoglobin 14.0; Platelets 318.0; Potassium 4.2; Sodium 139; TSH 0.46  Recent Lipid Panel    Component Value Date/Time   CHOL 190 11/25/2022 1333   CHOL 178 01/21/2019 1321   TRIG 85.0 11/25/2022 1333   HDL 55.70 11/25/2022 1333   HDL 61 01/21/2019 1321   CHOLHDL 3 11/25/2022 1333   VLDL 17.0 11/25/2022 1333   LDLCALC 117 (H) 11/25/2022 1333   LDLCALC 102 (H) 01/21/2019 1321    PHYSICAL EXAM:    VS:  BP 122/60   Pulse (!) 103   Ht 5\' 4"  (1.626 m)   Wt 263 lb (119.3 kg)   SpO2 92%   BMI 45.14 kg/m   BMI: Body mass index is 45.14 kg/m.  Affect appropriate Chronically ill overweight female  HEENT: normal Neck supple with no  adenopathy JVP normal no bruits no thyromegaly Lungs diffuse rhonchi worse in LUL Heart:  S1/S2 no murmur, no rub, gallop or click PMI normal Abdomen: benighn, BS positve, no tenderness, no AAA no bruit.  No HSM or HJR Distal pulses intact with no bruits Trace bilateral edema Neuro non-focal Skin warm and dry No muscular weakness   Wt Readings from Last 3 Encounters:  03/20/23 263 lb (119.3 kg)  01/10/23 265 lb 6.4 oz (120.4 kg)  12/29/22 265 lb 3.2 oz (120.3 kg)     ASSESSMENT & PLAN:   1. Dyspnea on exertion, chronic -related to ILD, COPD, OSA and pulmonary Hypertension Not related to cardiac issues and TTE with nonly Grade one diastolic dysfunction Prior BNP's have been normal Lasix low dose   2. Moderate pulmonary HTN - in the context of ILD, morbid obesity, deconditioning. F/U Dr Chase Caller   3. Chronic diastolic CHF - exam largely unchanged from prior. Would continue Lasix PRN as she is doing.    4. Hyperlipidemia - patient declines further monitoring for this at this time.    Disposition: F/u with cardiology in a year    Medication Adjustments/Labs and Tests Ordered: Current medicines are reviewed at length with the patient today.  Concerns regarding medicines are outlined above. Medication changes, Labs and Tests ordered today are summarized above and listed in the Patient Instructions accessible in Encounters.   Signed, Jenkins Rouge, MD  03/20/2023 2:10 PM    [place] Phone: [phone]; Fax: [phone]

## 2023-03-15 ENCOUNTER — Other Ambulatory Visit: Payer: Self-pay

## 2023-03-15 ENCOUNTER — Ambulatory Visit: Payer: Medicare Other

## 2023-03-15 ENCOUNTER — Telehealth: Payer: Self-pay | Admitting: Family Medicine

## 2023-03-15 DIAGNOSIS — M79661 Pain in right lower leg: Secondary | ICD-10-CM | POA: Diagnosis not present

## 2023-03-15 DIAGNOSIS — R2689 Other abnormalities of gait and mobility: Secondary | ICD-10-CM

## 2023-03-15 DIAGNOSIS — R262 Difficulty in walking, not elsewhere classified: Secondary | ICD-10-CM | POA: Diagnosis not present

## 2023-03-15 NOTE — Therapy (Signed)
OUTPATIENT PHYSICAL THERAPY LOWER EXTREMITY   Patient Name: Patty [name] MRN: [id number] DOB:[birth year], 72 y.o., female Today's Date: 03/15/2023  END OF SESSION:  PT End of Session - 03/15/23 1346     Visit Number 6    Date for PT Re-Evaluation 05/18/23    Progress Note Due on Visit 10    PT Start Time 1345    PT Stop Time 1430    PT Time Calculation (min) 45 min    Activity Tolerance Patient tolerated treatment well    Behavior During Therapy [facility] for tasks assessed/performed              Past Medical History:  Diagnosis Date   Abnormal nuclear stress test    a. 07/2017: cath with no CAD   Anxiety    Aortic atherosclerosis (HCC)    Arthritis    Chronic diastolic CHF (congestive heart failure) (HCC)    Coronary artery calcification seen on CT scan    Crohn disease (HCC)    DVT (deep venous thrombosis) (HCC)    Dyspnea    Grade I diastolic dysfunction    Hyperlipidemia    ILD (interstitial lung disease) (HCC)    OSA on CPAP    CPAP    AND O2   Pernicious anemia    Polycythemia vera ([place])    Pulmonary embolism ([place]) 2010   s/p hip surgery , reports this was never truly confirmed    Pulmonary hypertension ([place])    Sleep disorder 01/10/2020   Uses klonopin for sleep since 2014; sleep walking. And anxiety   Past Surgical History:  Procedure Laterality Date    RIGHT BREAST LUMPECTOMY WITH RADIOACTIVE SEED LOCALIZATION (Right Breast)  02/04/2021   BIOPSY  09/30/2019   Procedure: BIOPSY;  Surgeon: Jerene Bears, MD;  Location: [facility];  Service: Gastroenterology;;   BREAST LUMPECTOMY WITH RADIOACTIVE SEED LOCALIZATION Right 02/04/2021   Procedure: RIGHT BREAST LUMPECTOMY WITH RADIOACTIVE SEED LOCALIZATION;  Surgeon: Coralie Keens, MD;  Location: [facility];  Service: General;  Laterality: Right;   CARDIAC CATHETERIZATION     COLON RESECTION  1993   COLONOSCOPY WITH PROPOFOL N/A 09/30/2019   Procedure: COLONOSCOPY WITH PROPOFOL;  Surgeon: Jerene Bears, MD;   Location: [facility];  Service: Gastroenterology;  Laterality: N/A;   HIP ARTHROPLASTY Right    x 2, initial right THA, then subsequent right THA revision    LEFT HEART CATH AND CORONARY ANGIOGRAPHY N/A 07/28/2017   Procedure: Left Heart Cath and Coronary Angiography;  Surgeon: Burnell Blanks, MD;  Location: [place] [facility];  Service: Cardiovascular;  Laterality: N/A;   POLYPECTOMY  09/30/2019   Procedure: POLYPECTOMY;  Surgeon: Jerene Bears, MD;  Location: [facility];  Service: Gastroenterology;;   RIGHT HEART CATH N/A 12/07/2020   Procedure: RIGHT HEART CATH;  Surgeon: Sherren Mocha, MD;  Location: [place] [facility];  Service: Cardiovascular;  Laterality: N/A;   Patient Active Problem List   Diagnosis Date Noted   Pulmonary emphysema with fibrosis of lung ([place]) 01/31/2022   Sclerosing adenosis of right breast 02/04/2021   Prediabetes 02/01/2021   Pulmonary hypertension, unspecified ([place]) 12/07/2020   Osteopenia 08/05/2020   Adjustment disorder with anxiety 04/29/2020   Sleep disorder 01/10/2020   Chronic allergic rhinitis 01/10/2020   Pernicious anemia 01/10/2020   Chronic prescription benzodiazepine use 01/10/2020   Benign neoplasm of rectum    Dyslipidemia 09/24/2019   Morbid obesity ([place]) 09/24/2019   History of DVT (deep vein thrombosis),  with PE 07/25/2019   OSA on CPAP    Crohn disease ([place]) 06/15/2017   ILD (interstitial lung disease) ([place]) 05/29/2017   Chronic respiratory failure with hypoxia ([place]) 05/29/2017   History of polycythemia vera 11/12/2014    PCP: Billey Chang, MD  REFERRING PROVIDER: Pedro Earls MD  REFERRING DIAG: R lower leg pain  THERAPY DIAG:  Pain in right lower leg  Impairment of balance  Difficulty in walking, not elsewhere classified  Rationale for Evaluation and Treatment: Rehabilitation  ONSET DATE: August 2023  SUBJECTIVE:   SUBJECTIVE STATEMENT: Still achy R lat calf, rested some which I think helped after  last session.  Noticing better balance with tandem standing at sink now. PERTINENT HISTORY: Had new shoes, reached forward to the ground, shoes were taller, strained my R calf PAIN:  Are you having pain? Yes: Pain location: R lateral lower leg Pain description: aches, deep ache Aggravating factors: sit to stand Relieving factors: rests  PRECAUTIONS: Other: using on demand O2, checked her own O2 sats throughout eval  WEIGHT BEARING RESTRICTIONS: No  FALLS:  Has patient fallen in last 6 months? No  LIVING ENVIRONMENT: Lives with: lives alone Lives in: House/apartment Stairs: Yes: Internal: 2 steps; can reach both Has following equipment at home: Single point cane2  OCCUPATION: retired  PLOF: Independent  PATIENT GOALS: to be more I, stop pain R leg, avoid falls   NEXT MD VISIT: 05/25/23  OBJECTIVE:   DIAGNOSTIC FINDINGS: na, patient reports x rays - R ankle  PATIENT SURVEYS:  LEFS 30/80  62.5% disability COGNITION: Overall cognitive status: Within functional limits for tasks assessed     SENSATION: WFL  EDEMA:  None noted  MUSCLE LENGTH: Hamstrings: Right wfl deg; Left wfl deg   POSTURE:  R hip ER in standing, overpronates B ankles,with gait, greater R than L R medial gastrocs atrophied compared to L  PALPATION: Tender , thickened R medial gastrocs muscle belly, non tender post knee tender R fibular head  LOWER EXTREMITY ROM:  Passive ROM Right eval Left eval  Hip flexion 90   Hip extension    Hip abduction    Hip adduction    Hip internal rotation    Hip external rotation    Knee flexion    Knee extension    Ankle dorsiflexion 6 12  Ankle plantarflexion wfl wfl  Ankle inversion Wfl wfl  Ankle eversion Wfl wfl   (Blank rows = not tested)  LOWER EXTREMITY MMT:MMT B LE's equal and wnl.  Single leg heel raises 6 to 8 reps each  MMT Right eval Left eval  Hip flexion    Hip extension    Hip abduction    Hip adduction    Hip internal rotation     Hip external rotation    Knee flexion    Knee extension    Ankle dorsiflexion    Ankle plantarflexion    Ankle inversion    Ankle eversion     (Blank rows = not tested)  LOWER EXTREMITY SPECIAL TESTS:  Na today  FUNCTIONAL TESTS:  Single leg stance R and L 3 sec each  GAIT: Distance walked: 46' within department, rested and self monitored her pulse oxygen, dropped to 88 x 2 todaybut rebounded Assistive device utilized:  supplemental on demand O2 Level of assistance: Complete Independence Comments: na   TODAY'S TREATMENT:  DATE:   03/15/23: Seated multiplanar movements with R foot on blue disc  Seated B hamstring curls 35# B long arc quads 25#  Seated for toe raises, red mediball between heels 20x Seated toe raises orange theraband around ankles for isometric abduction 20 x Standing for toe raises orange theraband around ankles 25x Manual: Long sitting for calcaneal traction , A/P glides subtalar jt, stretching into R ankle dorsiflexion R side lying for cross friction massage R medial gastrocnemius  03/13/23:  Manual: Long sitting for calcaneal traction , A/P glides subtalar jt, stretching into R ankle dorsiflexion R side lying for cross friction massage R medial gastrocnemius blue disc  Therapeutic exercise: Instructed in self Mobilization with movement, R foot on step, forward lunges with theraband placed across R ant ankle Standing for B plantarflexor stretch on wooden adjustable wedge Standing for toe raises with fore foot on black bar Standing for multiplanar R ankle AROM in closed chain/ weight bearing position on airex   03/08/23: Manual: R sidelying , cross friction mass and myofascial release R medial plantarflexors  Therapeutic exercise:   On adjustable wedge; in ll bars for B plantarflexor stretch and B toe raises 15x Staggered  stance L foot on airex in ll bars, R foot on ground, with up/down head movements 15x Step for/ back, in ll bars, R foot planted on airex 15x 2 " step for L foot lateral heel taps to isolate eccentric control R plantarflexors , r lateral hip musculature, R VMO 15x Sitting RT foot on sit fit 4 way s 15 x each 15x Standing B toe raises with ball between ankles 15x, ll bars Seated theraband hamstring curls blue band, 30 reps  03/06/23:  Manual: R sidelying , cross friction mass and myofascial release R medial plantarflexors Long sitting for post glides R subtalar jt, with R ankle dorsiflexion stretch Lunges on step R LE for mobilization with movement R ankle dorsiflexion, subtalar joint, one bout of 10 reps  Therapeutic exercise:   Black bar  standing ankle Df and PF 2 sets 10 Calf and soleus stretch on black bar airex toe raise and heel raises Airex semitandem standing R foot post, head movements up/down, side side 2 " step for L foot lateral heel taps to isolate eccentric control R plantarflexors , r lateral hip musculature, R VMO  Sitting RT foot on sit fit 4 way s 15 x each  02/28/23 Black bar Df and PF 2 sets 10 Calf and soleus stretch on black bar On foam mat marching, toe raise and heel raises Sitting RT foot on sit fit 4 way s 15 x each Red tband ankle 4 way 2 sets 10 Ankle PROM and jt mobs to increase ROM Calf stretching and DSTW- talked about DN for next session Step stretch for calf and soleous   02/23/23 Instructed in therex as described below, also demod use of roller /rolling pin to self masage R medial calf     PATIENT EDUCATION:  Education details: POC Person educated: Patient Education method: Consulting civil engineer, Media planner, Verbal cues, and Handouts Education comprehension: verbalized understanding, returned demonstration, and verbal cues required  HOME EXERCISE PROGRAM: Access Code: [email or web address] URL: [email or web address] Date: 02/23/2023 Prepared by:  Hayden Pedro  Exercises - Seated Calf Stretch with Strap  - 3 x daily - 7 x weekly - 1 sets - 3 reps - 30 hold - Long Sitting Ankle Plantar Flexion with Resistance  - 1 x daily - 7 x weekly - 2 sets - 10 reps  ASSESSMENT:  CLINICAL IMPRESSION: Returned today for skilled physical therapy to address her function, strength, flexibility R ankle following R plantarflexor injury. She reduced the frequency of her exercise since her last session at home and notes less pain, tingling. Instability R lateral ankle.  Did discuss that she may benefit form custom orthotic r foot due to her overpronation. .Continues to benefit from skilled PT to address her deficits.  OBJECTIVE IMPAIRMENTS: cardiopulmonary status limiting activity, decreased activity tolerance, decreased balance, decreased endurance, difficulty walking, decreased ROM, decreased strength, hypomobility, and pain.   ACTIVITY LIMITATIONS: carrying, lifting, bending, standing, squatting, stairs, transfers, and locomotion level  PARTICIPATION LIMITATIONS: meal prep, laundry, and community activity  PERSONAL FACTORS: Fitness and 1-2 comorbidities: pulmonary fibrosis, h/o R hip and pelvic fracture  are also affecting patient's functional outcome.   REHAB POTENTIAL: Good  CLINICAL DECISION MAKING: Stable/uncomplicated  EVALUATION COMPLEXITY: Low   GOALS: Goals reviewed with patient? Yes  SHORT TERM GOALS: Target date: 03/09/23 I HEP Baseline: intiated today Goal status: IN PROGRESS  LONG TERM GOALS: Target date: 5/23/242.  1.Improve R ankle Rom for dorsiflexion from 6 to 10 degrees or greater Baseline: 6 Goal status: IN PROGRESS  2.  Improve unilateral balance to 10 sec or greater on each leg for reduced fall risk Baseline: 3 sec each leg Goal status: IN PROGRESS  3.  Able to complete unilateral heel raise R LE from 6 reps to 15 ore greater Baseline:  Goal status: IN PROGRESS  PLAN:  PT FREQUENCY: 1-2x/week  PT DURATION: 12  weeks  PLANNED INTERVENTIONS: Therapeutic exercises, Therapeutic activity, Neuromuscular re-education, Balance training, Gait training, Patient/Family education, Self Care, and Joint mobilization  PLAN FOR NEXT SESSION advance R ankle strengthening and balance   Indyah Saulnier L [place] Hanlon, PT 03/15/2023, 4:36 PM [facility] [place]. [place], [place], [address] Phone: [phone]   Fax:  [phone]  Patient Details  Name: Patty [name] MRN: [id number] Date of Birth: 1951-06-27 Referring Provider:  [name], MD  Encounter Date: 03/15/2023   Tim Lair, PT 03/15/2023, 4:36 PM  [facility] [place]. [place], [place], [address] Phone: [phone]   Fax:  [phone]

## 2023-03-15 NOTE — Telephone Encounter (Signed)
Copied from [place] [phone]. Topic: Medicare AWV >> Mar 15, 2023 11:28 AM Gillis Santa wrote: Reason for CRM: Called patient to schedule Medicare Annual Wellness Visit (AWV). Left message for patient to call back and schedule Medicare Annual Wellness Visit (AWV).  Last date of AWV: 12/03/2020  Please schedule an appointment at any time with Otila Kluver, [facility].  Please schedule AWVS with [facility], [place].  If any questions, please contact me at [phone].  Thank you ,  Shaune Pollack [facility] AWV TEAM Direct Dial [phone]

## 2023-03-20 ENCOUNTER — Encounter: Payer: Self-pay | Admitting: Cardiovascular Disease

## 2023-03-20 ENCOUNTER — Ambulatory Visit: Payer: Medicare Other | Attending: Cardiovascular Disease | Admitting: Cardiovascular Disease

## 2023-03-20 VITALS — BP 122/60 | HR 103 | Ht 64.0 in | Wt 263.0 lb

## 2023-03-20 DIAGNOSIS — R030 Elevated blood-pressure reading, without diagnosis of hypertension: Secondary | ICD-10-CM | POA: Insufficient documentation

## 2023-03-20 DIAGNOSIS — E785 Hyperlipidemia, unspecified: Secondary | ICD-10-CM | POA: Insufficient documentation

## 2023-03-20 DIAGNOSIS — I272 Pulmonary hypertension, unspecified: Secondary | ICD-10-CM | POA: Diagnosis not present

## 2023-03-20 DIAGNOSIS — R0609 Other forms of dyspnea: Secondary | ICD-10-CM

## 2023-03-20 NOTE — Patient Instructions (Signed)
Medication Instructions:  Your physician recommends that you continue on your current medications as directed. Please refer to the Current Medication list given to you today.  *If you need a refill on your cardiac medications before your next appointment, please call your pharmacy*  Lab Work: If you have labs (blood work) drawn today and your tests are completely normal, you will receive your results only by: MyChart Message (if you have MyChart) OR A paper copy in the mail If you have any lab test that is abnormal or we need to change your treatment, we will call you to review the results.  Follow-Up: At [facility], you and your health needs are our priority.  As part of our continuing mission to provide you with exceptional heart care, we have created designated Provider Care Teams.  These Care Teams include your primary Cardiologist (physician) and Advanced Practice Providers (APPs -  Physician Assistants and Nurse Practitioners) who all work together to provide you with the care you need, when you need it.  We recommend signing up for the patient portal called "MyChart".  Sign up information is provided on this After Visit Summary.  MyChart is used to connect with patients for Virtual Visits (Telemedicine).  Patients are able to view lab/test results, encounter notes, upcoming appointments, etc.  Non-urgent messages can be sent to your provider as well.   To learn more about what you can do with MyChart, go to [email or web address].    Your next appointment:   1 year(s)  Provider:   Peter Nishan, MD     

## 2023-03-21 ENCOUNTER — Ambulatory Visit: Payer: Medicare Other | Admitting: Physical Therapy

## 2023-03-21 DIAGNOSIS — R2689 Other abnormalities of gait and mobility: Secondary | ICD-10-CM

## 2023-03-21 DIAGNOSIS — M79661 Pain in right lower leg: Secondary | ICD-10-CM

## 2023-03-21 DIAGNOSIS — R262 Difficulty in walking, not elsewhere classified: Secondary | ICD-10-CM

## 2023-03-21 NOTE — Therapy (Signed)
OUTPATIENT PHYSICAL THERAPY LOWER EXTREMITY   Patient Name: Patty [name] MRN: [id number] DOB:[birth year], 72 y.o., female Today's Date: 03/21/2023  END OF SESSION:  PT End of Session - 03/21/23 1307     Visit Number 7    Date for PT Re-Evaluation 05/18/23    PT Start Time 1310    PT Stop Time 1355    PT Time Calculation (min) 45 min              Past Medical History:  Diagnosis Date   Abnormal nuclear stress test    a. 07/2017: cath with no CAD   Anxiety    Aortic atherosclerosis (HCC)    Arthritis    Chronic diastolic CHF (congestive heart failure) (HCC)    Coronary artery calcification seen on CT scan    Crohn disease ([place])    DVT (deep venous thrombosis) (HCC)    Dyspnea    Grade I diastolic dysfunction    Hyperlipidemia    ILD (interstitial lung disease) ([place])    OSA on CPAP    CPAP    AND O2   Pernicious anemia    Polycythemia vera ([place])    Pulmonary embolism ([place]) 2010   s/p hip surgery , reports this was never truly confirmed    Pulmonary hypertension ([place])    Sleep disorder 01/10/2020   Uses klonopin for sleep since 2014; sleep walking. And anxiety   Past Surgical History:  Procedure Laterality Date    RIGHT BREAST LUMPECTOMY WITH RADIOACTIVE SEED LOCALIZATION (Right Breast)  02/04/2021   BIOPSY  09/30/2019   Procedure: BIOPSY;  Surgeon: Jerene Bears, MD;  Location: [facility];  Service: Gastroenterology;;   BREAST LUMPECTOMY WITH RADIOACTIVE SEED LOCALIZATION Right 02/04/2021   Procedure: RIGHT BREAST LUMPECTOMY WITH RADIOACTIVE SEED LOCALIZATION;  Surgeon: Coralie Keens, MD;  Location: [facility];  Service: General;  Laterality: Right;   CARDIAC CATHETERIZATION     COLON RESECTION  1993   COLONOSCOPY WITH PROPOFOL N/A 09/30/2019   Procedure: COLONOSCOPY WITH PROPOFOL;  Surgeon: Jerene Bears, MD;  Location: [facility];  Service: Gastroenterology;  Laterality: N/A;   HIP ARTHROPLASTY Right    x 2, initial right THA, then subsequent right THA  revision    LEFT HEART CATH AND CORONARY ANGIOGRAPHY N/A 07/28/2017   Procedure: Left Heart Cath and Coronary Angiography;  Surgeon: Burnell Blanks, MD;  Location: [facility];  Service: Cardiovascular;  Laterality: N/A;   POLYPECTOMY  09/30/2019   Procedure: POLYPECTOMY;  Surgeon: Jerene Bears, MD;  Location: [facility];  Service: Gastroenterology;;   RIGHT HEART CATH N/A 12/07/2020   Procedure: RIGHT HEART CATH;  Surgeon: Sherren Mocha, MD;  Location: [facility];  Service: Cardiovascular;  Laterality: N/A;   Patient Active Problem List   Diagnosis Date Noted   Pulmonary emphysema with fibrosis of lung ([place]) 01/31/2022   Sclerosing adenosis of right breast 02/04/2021   Prediabetes 02/01/2021   Pulmonary hypertension, unspecified ([place]) 12/07/2020   Osteopenia 08/05/2020   Adjustment disorder with anxiety 04/29/2020   Sleep disorder 01/10/2020   Chronic allergic rhinitis 01/10/2020   Pernicious anemia 01/10/2020   Chronic prescription benzodiazepine use 01/10/2020   Benign neoplasm of rectum    Dyslipidemia 09/24/2019   Morbid obesity ([place]) 09/24/2019   History of DVT (deep vein thrombosis), with PE 07/25/2019   OSA on CPAP    Crohn disease ([place]) 06/15/2017   ILD (interstitial lung disease) ([place]) 05/29/2017   Chronic respiratory failure  with hypoxia ([place]) 05/29/2017   History of polycythemia vera 11/12/2014    PCP: Billey Chang, MD  REFERRING PROVIDER: Pedro Earls MD  REFERRING DIAG: R lower leg pain  THERAPY DIAG:  Pain in right lower leg  Impairment of balance  Difficulty in walking, not elsewhere classified  Rationale for Evaluation and Treatment: Rehabilitation  ONSET DATE: August 2023  SUBJECTIVE:   SUBJECTIVE STATEMENT: more mobility. Backed off ex some and pain better   PERTINENT HISTORY: Had new shoes, reached forward to the ground, shoes were taller, strained my R calf PAIN:  Are you having pain? Yes: Pain location: R  lateral lower leg Pain description: aches, deep ache Aggravating factors: sit to stand Relieving factors: rests  PRECAUTIONS: Other: using on demand O2, checked her own O2 sats throughout eval  WEIGHT BEARING RESTRICTIONS: No  FALLS:  Has patient fallen in last 6 months? No  LIVING ENVIRONMENT: Lives with: lives alone Lives in: House/apartment Stairs: Yes: Internal: 2 steps; can reach both Has following equipment at home: Single point cane2  OCCUPATION: retired  PLOF: Independent  PATIENT GOALS: to be more I, stop pain R leg, avoid falls   NEXT MD VISIT: 05/25/23  OBJECTIVE:   DIAGNOSTIC FINDINGS: na, patient reports x rays - R ankle  PATIENT SURVEYS:  LEFS 30/80  62.5% disability COGNITION: Overall cognitive status: Within functional limits for tasks assessed     SENSATION: WFL  EDEMA:  None noted  MUSCLE LENGTH: Hamstrings: Right wfl deg; Left wfl deg   POSTURE:  R hip ER in standing, overpronates B ankles,with gait, greater R than L R medial gastrocs atrophied compared to L  PALPATION: Tender , thickened R medial gastrocs muscle belly, non tender post knee tender R fibular head  LOWER EXTREMITY ROM:  Passive ROM Right eval Left eval RT 03/21/23  Hip flexion 90    Hip extension     Hip abduction     Hip adduction     Hip internal rotation     Hip external rotation     Knee flexion     Knee extension     Ankle dorsiflexion 6 12 12   Ankle plantarflexion wfl wfl   Ankle inversion Wfl wfl   Ankle eversion Wfl wfl    (Blank rows = not tested)  LOWER EXTREMITY MMT:MMT B LE's equal and wnl.  Single leg heel raises 6 to 8 reps each  MMT Right eval Left eval  Hip flexion    Hip extension    Hip abduction    Hip adduction    Hip internal rotation    Hip external rotation    Knee flexion    Knee extension    Ankle dorsiflexion    Ankle plantarflexion    Ankle inversion    Ankle eversion     (Blank rows = not tested)  LOWER EXTREMITY  SPECIAL TESTS:  Na today  FUNCTIONAL TESTS:  Single leg stance R and L 3 sec each  GAIT: Distance walked: 46' within department, rested and self monitored her pulse oxygen, dropped to 88 x 2 todaybut rebounded Assistive device utilized:  supplemental on demand O2 Level of assistance: Complete Independence Comments: na   TODAY'S TREATMENT:  DATE:    03/21/23 HS curl 35# 2 sets 15 Knee ext 25 # 3 sets 10 Seated for toe raises, red mediball between heels 20x Seated dyna disc various ankle ROM  Black bar standing heel raise/toe raise 2 sets 10 In // bars on foam beams marching 20 x, tandem ( up and back 5 x) and side stepping 5 x Toe raies and heel raises 2 sets 10 , mini squats 2 sets 10 Red tband DF 2 sets 10 STW and stretching to RT ankle and calf     03/15/23: Seated multiplanar movements with R foot on blue disc  Seated B hamstring curls 35# B long arc quads 25#  Seated for toe raises, red mediball between heels 20x Seated toe raises orange theraband around ankles for isometric abduction 20 x Standing for toe raises orange theraband around ankles 25x Manual: Long sitting for calcaneal traction , A/P glides subtalar jt, stretching into R ankle dorsiflexion R side lying for cross friction massage R medial gastrocnemius  03/13/23:  Manual: Long sitting for calcaneal traction , A/P glides subtalar jt, stretching into R ankle dorsiflexion R side lying for cross friction massage R medial gastrocnemius blue disc  Therapeutic exercise: Instructed in self Mobilization with movement, R foot on step, forward lunges with theraband placed across R ant ankle Standing for B plantarflexor stretch on wooden adjustable wedge Standing for toe raises with fore foot on black bar Standing for multiplanar R ankle AROM in closed chain/ weight bearing position on  airex   03/08/23: Manual: R sidelying , cross friction mass and myofascial release R medial plantarflexors  Therapeutic exercise:   On adjustable wedge; in ll bars for B plantarflexor stretch and B toe raises 15x Staggered stance L foot on airex in ll bars, R foot on ground, with up/down head movements 15x Step for/ back, in ll bars, R foot planted on airex 15x 2 " step for L foot lateral heel taps to isolate eccentric control R plantarflexors , r lateral hip musculature, R VMO 15x Sitting RT foot on sit fit 4 way s 15 x each 15x Standing B toe raises with ball between ankles 15x, ll bars Seated theraband hamstring curls blue band, 30 reps  03/06/23:  Manual: R sidelying , cross friction mass and myofascial release R medial plantarflexors Long sitting for post glides R subtalar jt, with R ankle dorsiflexion stretch Lunges on step R LE for mobilization with movement R ankle dorsiflexion, subtalar joint, one bout of 10 reps  Therapeutic exercise:   Black bar  standing ankle Df and PF 2 sets 10 Calf and soleus stretch on black bar airex toe raise and heel raises Airex semitandem standing R foot post, head movements up/down, side side 2 " step for L foot lateral heel taps to isolate eccentric control R plantarflexors , r lateral hip musculature, R VMO  Sitting RT foot on sit fit 4 way s 15 x each  02/28/23 Black bar Df and PF 2 sets 10 Calf and soleus stretch on black bar On foam mat marching, toe raise and heel raises Sitting RT foot on sit fit 4 way s 15 x each Red tband ankle 4 way 2 sets 10 Ankle PROM and jt mobs to increase ROM Calf stretching and DSTW- talked about DN for next session Step stretch for calf and soleous   02/23/23 Instructed in therex as described below, also demod use of roller /rolling pin to self masage R medial calf  PATIENT EDUCATION:  Education details: POC Person educated: Patient Education method: Consulting civil engineer, Media planner, Verbal cues, and  Handouts Education comprehension: verbalized understanding, returned demonstration, and verbal cues required  HOME EXERCISE PROGRAM: Access Code: [email or web address] URL: [email or web address] Date: 02/23/2023 Prepared by: Warren Lacy Speaks  Exercises - Seated Calf Stretch with Strap  - 3 x daily - 7 x weekly - 1 sets - 3 reps - 30 hold - Long Sitting Ankle Plantar Flexion with Resistance  - 1 x daily - 7 x weekly - 2 sets - 10 reps  ASSESSMENT:  CLINICAL IMPRESSION:DF increased from 6 to 12. Progressing func strengthening followed but MT   OBJECTIVE IMPAIRMENTS: cardiopulmonary status limiting activity, decreased activity tolerance, decreased balance, decreased endurance, difficulty walking, decreased ROM, decreased strength, hypomobility, and pain.   ACTIVITY LIMITATIONS: carrying, lifting, bending, standing, squatting, stairs, transfers, and locomotion level  PARTICIPATION LIMITATIONS: meal prep, laundry, and community activity  PERSONAL FACTORS: Fitness and 1-2 comorbidities: pulmonary fibrosis, h/o R hip and pelvic fracture  are also affecting patient's functional outcome.   REHAB POTENTIAL: Good  CLINICAL DECISION MAKING: Stable/uncomplicated  EVALUATION COMPLEXITY: Low   GOALS: Goals reviewed with patient? Yes  SHORT TERM GOALS: Target date: 03/09/23 I HEP Baseline: intiated today Goal status: IN PROGRESS  LONG TERM GOALS: Target date: 5/23/242.  1.Improve R ankle Rom for dorsiflexion from 6 to 10 degrees or greater Baseline: 6 Goal status: IN PROGRESS  2.  Improve unilateral balance to 10 sec or greater on each leg for reduced fall risk Baseline: 3 sec each leg Goal status: IN PROGRESS  3.  Able to complete unilateral heel raise R LE from 6 reps to 15 ore greater Baseline:  Goal status: IN PROGRESS  PLAN:  PT FREQUENCY: 1-2x/week  PT DURATION: 12 weeks  PLANNED INTERVENTIONS: Therapeutic exercises, Therapeutic activity, Neuromuscular re-education, Balance  training, Gait training, Patient/Family education, Self Care, and Joint mobilization  PLAN FOR NEXT SESSION advance R ankle strengthening and balance   Illianna Paschal,ANGIE, PTA 03/21/2023, 1:08 PM [facility] [place]. [place], [place], [address] Phone: [phone]   Fax:  [phone]  Patient Details  Name: Patty [name] MRN: [id number] Date of Birth: 11-03-1951 Referring Provider:  [name], MD  Encounter Date: 03/21/2023   Laqueta Carina, PTA 03/21/2023, 1:08 PM

## 2023-03-22 ENCOUNTER — Encounter: Payer: Medicare Other | Admitting: Podiatry

## 2023-03-23 ENCOUNTER — Ambulatory Visit: Payer: Medicare Other | Admitting: Physical Therapy

## 2023-03-23 DIAGNOSIS — M79661 Pain in right lower leg: Secondary | ICD-10-CM

## 2023-03-23 DIAGNOSIS — R262 Difficulty in walking, not elsewhere classified: Secondary | ICD-10-CM | POA: Diagnosis not present

## 2023-03-23 DIAGNOSIS — R2689 Other abnormalities of gait and mobility: Secondary | ICD-10-CM | POA: Diagnosis not present

## 2023-03-23 NOTE — Therapy (Signed)
OUTPATIENT PHYSICAL THERAPY LOWER EXTREMITY   Patient Name: Patty [name] MRN: [id number] DOB:12-22-1951, 72 y.o., female Today's Date: 03/23/2023  END OF SESSION:  PT End of Session - 03/23/23 1442     Visit Number 8    Date for PT Re-Evaluation 05/18/23    PT Start Time 1445    PT Stop Time [id number]    PT Time Calculation (min) 45 min              Past Medical History:  Diagnosis Date   Abnormal nuclear stress test    a. 07/2017: cath with no CAD   Anxiety    Aortic atherosclerosis (HCC)    Arthritis    Chronic diastolic CHF (congestive heart failure) (HCC)    Coronary artery calcification seen on CT scan    Crohn disease ([place])    DVT (deep venous thrombosis) (HCC)    Dyspnea    Grade I diastolic dysfunction    Hyperlipidemia    ILD (interstitial lung disease) ([employer])    OSA on CPAP    CPAP    AND O2   Pernicious anemia    Polycythemia vera ([place])    Pulmonary embolism ([place]) 2010   s/p hip surgery , reports this was never truly confirmed    Pulmonary hypertension ([place])    Sleep disorder 01/10/2020   Uses klonopin for sleep since 2014; sleep walking. And anxiety   Past Surgical History:  Procedure Laterality Date    RIGHT BREAST LUMPECTOMY WITH RADIOACTIVE SEED LOCALIZATION (Right Breast)  02/04/2021   BIOPSY  09/30/2019   Procedure: BIOPSY;  Surgeon: Jerene Bears, MD;  Location: [facility];  Service: Gastroenterology;;   BREAST LUMPECTOMY WITH RADIOACTIVE SEED LOCALIZATION Right 02/04/2021   Procedure: RIGHT BREAST LUMPECTOMY WITH RADIOACTIVE SEED LOCALIZATION;  Surgeon: Coralie Keens, MD;  Location: [facility];  Service: General;  Laterality: Right;   CARDIAC CATHETERIZATION     COLON RESECTION  1993   COLONOSCOPY WITH PROPOFOL N/A 09/30/2019   Procedure: COLONOSCOPY WITH PROPOFOL;  Surgeon: Jerene Bears, MD;  Location: [facility];  Service: Gastroenterology;  Laterality: N/A;   HIP ARTHROPLASTY Right    x 2, initial right THA, then subsequent right THA  revision    LEFT HEART CATH AND CORONARY ANGIOGRAPHY N/A 07/28/2017   Procedure: Left Heart Cath and Coronary Angiography;  Surgeon: Burnell Blanks, MD;  Location: [facility];  Service: Cardiovascular;  Laterality: N/A;   POLYPECTOMY  09/30/2019   Procedure: POLYPECTOMY;  Surgeon: Jerene Bears, MD;  Location: [facility];  Service: Gastroenterology;;   RIGHT HEART CATH N/A 12/07/2020   Procedure: RIGHT HEART CATH;  Surgeon: Sherren Mocha, MD;  Location: [facility];  Service: Cardiovascular;  Laterality: N/A;   Patient Active Problem List   Diagnosis Date Noted   Pulmonary emphysema with fibrosis of lung ([place]) 01/31/2022   Sclerosing adenosis of right breast 02/04/2021   Prediabetes 02/01/2021   Pulmonary hypertension, unspecified ([place]) 12/07/2020   Osteopenia 08/05/2020   Adjustment disorder with anxiety 04/29/2020   Sleep disorder 01/10/2020   Chronic allergic rhinitis 01/10/2020   Pernicious anemia 01/10/2020   Chronic prescription benzodiazepine use 01/10/2020   Benign neoplasm of rectum    Dyslipidemia 09/24/2019   Morbid obesity ([place]) 09/24/2019   History of DVT (deep vein thrombosis), with PE 07/25/2019   OSA on CPAP    Crohn disease ([place]) 06/15/2017   ILD (interstitial lung disease) ([place]) 05/29/2017   Chronic respiratory failure  with hypoxia ([place]) 05/29/2017   History of polycythemia vera 11/12/2014    PCP: Billey Chang, MD  REFERRING PROVIDER: Pedro Earls MD  REFERRING DIAG: R lower leg pain  THERAPY DIAG:  Pain in right lower leg  Impairment of balance  Difficulty in walking, not elsewhere classified  Rationale for Evaluation and Treatment: Rehabilitation  ONSET DATE: August 2023  SUBJECTIVE:   SUBJECTIVE STATEMENT: more mobility. Felt more moble. Left distal Lateral leg pain decreased 605   PERTINENT HISTORY: Had new shoes, reached forward to the ground, shoes were taller, strained my R calf PAIN:  Are you having pain?  Yes: Pain location: R lateral lower leg Pain description: aches, deep ache Aggravating factors: sit to stand Relieving factors: rests  PRECAUTIONS: Other: using on demand O2, checked her own O2 sats throughout eval  WEIGHT BEARING RESTRICTIONS: No  FALLS:  Has patient fallen in last 6 months? No  LIVING ENVIRONMENT: Lives with: lives alone Lives in: House/apartment Stairs: Yes: Internal: 2 steps; can reach both Has following equipment at home: Single point cane2  OCCUPATION: retired  PLOF: Independent  PATIENT GOALS: to be more I, stop pain R leg, avoid falls   NEXT MD VISIT: 05/25/23  OBJECTIVE:   DIAGNOSTIC FINDINGS: na, patient reports x rays - R ankle  PATIENT SURVEYS:  LEFS 30/80  62.5% disability COGNITION: Overall cognitive status: Within functional limits for tasks assessed     SENSATION: WFL  EDEMA:  None noted  MUSCLE LENGTH: Hamstrings: Right wfl deg; Left wfl deg   POSTURE:  R hip ER in standing, overpronates B ankles,with gait, greater R than L R medial gastrocs atrophied compared to L  PALPATION: Tender , thickened R medial gastrocs muscle belly, non tender post knee tender R fibular head  LOWER EXTREMITY ROM:  Passive ROM Right eval Left eval RT 03/21/23  Hip flexion 90    Hip extension     Hip abduction     Hip adduction     Hip internal rotation     Hip external rotation     Knee flexion     Knee extension     Ankle dorsiflexion 6 12 12   Ankle plantarflexion wfl wfl   Ankle inversion Wfl wfl   Ankle eversion Wfl wfl    (Blank rows = not tested)  LOWER EXTREMITY MMT:MMT B LE's equal and wnl.  Single leg heel raises 6 to 8 reps each  MMT Right eval Left eval  Hip flexion    Hip extension    Hip abduction    Hip adduction    Hip internal rotation    Hip external rotation    Knee flexion    Knee extension    Ankle dorsiflexion    Ankle plantarflexion    Ankle inversion    Ankle eversion     (Blank rows = not  tested)  LOWER EXTREMITY SPECIAL TESTS:  Na today  FUNCTIONAL TESTS:  Single leg stance R and L 3 sec each  GAIT: Distance walked: 55' within department, rested and self monitored her pulse oxygen, dropped to 88 x 2 todaybut rebounded Assistive device utilized:  supplemental on demand O2 Level of assistance: Complete Independence Comments: na   TODAY'S TREATMENT:  DATE:    03/23/23 Nustep L 4 5 min LE only 30# resisted gait 5 x 4 ways Black bar standing heel raise/toe raise 2 sets 10 HS curl 35# 2 sets 15 Knee ext 25 # 3 sets 10 6 inch step up 10 leading RT 10 x leading left CGA with cuing STS airex 3 sets 10 Red tband DF 2 sets 10 Seated for toe raises, red mediball between heels 20x STW and stretching to RT ankle and calf  03/21/23 HS curl 35# 2 sets 15 Knee ext 25 # 3 sets 10 Seated for toe raises, red mediball between heels 20x Seated dyna disc various ankle ROM  Black bar standing heel raise/toe raise 2 sets 10 In // bars on foam beams marching 20 x, tandem ( up and back 5 x) and side stepping 5 x Toe raies and heel raises 2 sets 10 , mini squats 2 sets 10 Red tband DF 2 sets 10 STW and stretching to RT ankle and calf     03/15/23: Seated multiplanar movements with R foot on blue disc  Seated B hamstring curls 35# B long arc quads 25#  Seated for toe raises, red mediball between heels 20x Seated toe raises orange theraband around ankles for isometric abduction 20 x Standing for toe raises orange theraband around ankles 25x Manual: Long sitting for calcaneal traction , A/P glides subtalar jt, stretching into R ankle dorsiflexion R side lying for cross friction massage R medial gastrocnemius  03/13/23:  Manual: Long sitting for calcaneal traction , A/P glides subtalar jt, stretching into R ankle dorsiflexion R side lying for cross friction  massage R medial gastrocnemius blue disc  Therapeutic exercise: Instructed in self Mobilization with movement, R foot on step, forward lunges with theraband placed across R ant ankle Standing for B plantarflexor stretch on wooden adjustable wedge Standing for toe raises with fore foot on black bar Standing for multiplanar R ankle AROM in closed chain/ weight bearing position on airex   03/08/23: Manual: R sidelying , cross friction mass and myofascial release R medial plantarflexors  Therapeutic exercise:   On adjustable wedge; in ll bars for B plantarflexor stretch and B toe raises 15x Staggered stance L foot on airex in ll bars, R foot on ground, with up/down head movements 15x Step for/ back, in ll bars, R foot planted on airex 15x 2 " step for L foot lateral heel taps to isolate eccentric control R plantarflexors , r lateral hip musculature, R VMO 15x Sitting RT foot on sit fit 4 way s 15 x each 15x Standing B toe raises with ball between ankles 15x, ll bars Seated theraband hamstring curls blue band, 30 reps  03/06/23:  Manual: R sidelying , cross friction mass and myofascial release R medial plantarflexors Long sitting for post glides R subtalar jt, with R ankle dorsiflexion stretch Lunges on step R LE for mobilization with movement R ankle dorsiflexion, subtalar joint, one bout of 10 reps  Therapeutic exercise:   Black bar  standing ankle Df and PF 2 sets 10 Calf and soleus stretch on black bar airex toe raise and heel raises Airex semitandem standing R foot post, head movements up/down, side side 2 " step for L foot lateral heel taps to isolate eccentric control R plantarflexors , r lateral hip musculature, R VMO  Sitting RT foot on sit fit 4 way s 15 x each  02/28/23 Black bar Df and PF 2 sets 10 Calf and soleus stretch on  black bar On foam mat marching, toe raise and heel raises Sitting RT foot on sit fit 4 way s 15 x each Red tband ankle 4 way 2 sets 10 Ankle PROM and  jt mobs to increase ROM Calf stretching and DSTW- talked about DN for next session Step stretch for calf and soleous   02/23/23 Instructed in therex as described below, also demod use of roller /rolling pin to self masage R medial calf     PATIENT EDUCATION:  Education details: POC Person educated: Patient Education method: Consulting civil engineer, Media planner, Verbal cues, and Handouts Education comprehension: verbalized understanding, returned demonstration, and verbal cues required  HOME EXERCISE PROGRAM: Access Code: [email or web address] URL: [email or web address] Date: 02/23/2023 Prepared by: Warren Lacy Speaks  Exercises - Seated Calf Stretch with Strap  - 3 x daily - 7 x weekly - 1 sets - 3 reps - 30 hold - Long Sitting Ankle Plantar Flexion with Resistance  - 1 x daily - 7 x weekly - 2 sets - 10 reps  ASSESSMENT:  CLINICAL IMPRESSION progressed strengthening and dynamic balance acvities. Assessed goals. Overall pt states pain 60% better and increased ease of func  OBJECTIVE IMPAIRMENTS: cardiopulmonary status limiting activity, decreased activity tolerance, decreased balance, decreased endurance, difficulty walking, decreased ROM, decreased strength, hypomobility, and pain.   ACTIVITY LIMITATIONS: carrying, lifting, bending, standing, squatting, stairs, transfers, and locomotion level  PARTICIPATION LIMITATIONS: meal prep, laundry, and community activity  PERSONAL FACTORS: Fitness and 1-2 comorbidities: pulmonary fibrosis, h/o R hip and pelvic fracture  are also affecting patient's functional outcome.   REHAB POTENTIAL: Good  CLINICAL DECISION MAKING: Stable/uncomplicated  EVALUATION COMPLEXITY: Low   GOALS: Goals reviewed with patient? Yes  SHORT TERM GOALS: Target date: 03/09/23 I HEP Baseline: intiated today Goal status: IN PROGRESS  03/23/23 MET  LONG TERM GOALS: Target date: 5/23/242.  1.Improve R ankle Rom for dorsiflexion from 6 to 10 degrees or greater Baseline:  6 Goal status: IN PROGRESS  03/23/23 MET  2.  Improve unilateral balance to 10 sec or greater on each leg for reduced fall risk Baseline: 3 sec each leg Goal status: IN PROGRESS  03/23/23  3.  Able to complete unilateral heel raise R LE from 6 reps to 15 ore greater Baseline:  Goal status: IN PROGRESS 3 /28/24  PLAN:  PT FREQUENCY: 1-2x/week  PT DURATION: 12 weeks  PLANNED INTERVENTIONS: Therapeutic exercises, Therapeutic activity, Neuromuscular re-education, Balance training, Gait training, Patient/Family education, Self Care, and Joint mobilization  PLAN FOR NEXT SESSION advance R ankle strengthening and balance   Sheenah Dimitroff,ANGIE, PTA 03/23/2023, 2:43 PM [facility]. [place], [place], [address] Phone: [phone]   Fax:  [phone]  Patient Details  Name: Patty [name] MRN: [id number] Date of Birth: 1951/06/05 Referring Provider:  [name], MD  Encounter Date: 03/23/2023

## 2023-03-29 ENCOUNTER — Telehealth: Payer: Self-pay | Admitting: Family Medicine

## 2023-03-29 NOTE — Telephone Encounter (Signed)
Copied from [place] [id number]. Topic: Medicare AWV >> Mar 29, 2023 12:22 PM Gillis Santa wrote: Reason for CRM: Called patient to schedule Medicare Annual Wellness Visit (AWV). Left message for patient to call back and schedule Medicare Annual Wellness Visit (AWV).  Last date of AWV: 12/03/2020  Please schedule an appointment at any time with Otila Kluver, [facility]. Please schedule AWVS with Otila Kluver, [place]..  If any questions, please contact me at [phone].  Thank you ,  Shaune Pollack [facility] AWV TEAM Direct Dial [phone]

## 2023-04-04 ENCOUNTER — Ambulatory Visit: Payer: Medicare Other | Attending: Sports Medicine | Admitting: Physical Therapy

## 2023-04-04 ENCOUNTER — Ambulatory Visit: Payer: Medicare Other

## 2023-04-04 DIAGNOSIS — R2689 Other abnormalities of gait and mobility: Secondary | ICD-10-CM | POA: Diagnosis not present

## 2023-04-04 DIAGNOSIS — R262 Difficulty in walking, not elsewhere classified: Secondary | ICD-10-CM | POA: Diagnosis not present

## 2023-04-04 DIAGNOSIS — M79661 Pain in right lower leg: Secondary | ICD-10-CM | POA: Diagnosis not present

## 2023-04-04 NOTE — Therapy (Signed)
OUTPATIENT PHYSICAL THERAPY LOWER EXTREMITY   Patient Name: Patty [name] MRN: [id number] DOB:05-17-1951, 72 y.o., female Today's Date: 04/04/2023  END OF SESSION:  PT End of Session - 04/04/23 1445     Visit Number 9    Date for PT Re-Evaluation 05/18/23    PT Start Time 1445    PT Stop Time 1530    PT Time Calculation (min) 45 min              Past Medical History:  Diagnosis Date   Abnormal nuclear stress test    a. 07/2017: cath with no CAD   Anxiety    Aortic atherosclerosis (HCC)    Arthritis    Chronic diastolic CHF (congestive heart failure) (HCC)    Coronary artery calcification seen on CT scan    Crohn disease (HCC)    DVT (deep venous thrombosis) (HCC)    Dyspnea    Grade I diastolic dysfunction    Hyperlipidemia    ILD (interstitial lung disease) (HCC)    OSA on CPAP    CPAP    AND O2   Pernicious anemia    Polycythemia vera (HCC)    Pulmonary embolism (HCC) 2010   s/p hip surgery , reports this was never truly confirmed    Pulmonary hypertension (HCC)    Sleep disorder 01/10/2020   Uses klonopin for sleep since 2014; sleep walking. And anxiety   Past Surgical History:  Procedure Laterality Date    RIGHT BREAST LUMPECTOMY WITH RADIOACTIVE SEED LOCALIZATION (Right Breast)  02/04/2021   BIOPSY  09/30/2019   Procedure: BIOPSY;  Surgeon: Beverley Fiedler, MD;  Location: [facility];  Service: Gastroenterology;;   BREAST LUMPECTOMY WITH RADIOACTIVE SEED LOCALIZATION Right 02/04/2021   Procedure: RIGHT BREAST LUMPECTOMY WITH RADIOACTIVE SEED LOCALIZATION;  Surgeon: Abigail Miyamoto, MD;  Location: [facility] OR;  Service: General;  Laterality: Right;   CARDIAC CATHETERIZATION     COLON RESECTION  1993   COLONOSCOPY WITH PROPOFOL N/A 09/30/2019   Procedure: COLONOSCOPY WITH PROPOFOL;  Surgeon: Beverley Fiedler, MD;  Location: [facility];  Service: Gastroenterology;  Laterality: N/A;   HIP ARTHROPLASTY Right    x 2, initial right THA, then subsequent right THA  revision    LEFT HEART CATH AND CORONARY ANGIOGRAPHY N/A 07/28/2017   Procedure: Left Heart Cath and Coronary Angiography;  Surgeon: Kathleene Hazel, MD;  Location: [facility] INVASIVE CV LAB;  Service: Cardiovascular;  Laterality: N/A;   POLYPECTOMY  09/30/2019   Procedure: POLYPECTOMY;  Surgeon: Beverley Fiedler, MD;  Location: [facility];  Service: Gastroenterology;;   RIGHT HEART CATH N/A 12/07/2020   Procedure: RIGHT HEART CATH;  Surgeon: Tonny Bollman, MD;  Location: [facility] CV LAB;  Service: Cardiovascular;  Laterality: N/A;   Patient Active Problem List   Diagnosis Date Noted   Pulmonary emphysema with fibrosis of lung 01/31/2022   Sclerosing adenosis of right breast 02/04/2021   Prediabetes 02/01/2021   Pulmonary hypertension, unspecified 12/07/2020   Osteopenia 08/05/2020   Adjustment disorder with anxiety 04/29/2020   Sleep disorder 01/10/2020   Chronic allergic rhinitis 01/10/2020   Pernicious anemia 01/10/2020   Chronic prescription benzodiazepine use 01/10/2020   Benign neoplasm of rectum    Dyslipidemia 09/24/2019   Morbid obesity 09/24/2019   History of DVT (deep vein thrombosis), with PE 07/25/2019   OSA on CPAP    Crohn disease 06/15/2017   ILD (interstitial lung disease) 05/29/2017   Chronic respiratory failure with hypoxia 05/29/2017  History of polycythemia vera 11/12/2014    PCP: Asencion Partridge, MD  REFERRING PROVIDER: Delfin Gant MD  REFERRING DIAG: R lower leg pain  THERAPY DIAG:  Pain in right lower leg  Impairment of balance  Difficulty in walking, not elsewhere classified  Rationale for Evaluation and Treatment: Rehabilitation  ONSET DATE: August 2023  SUBJECTIVE:   SUBJECTIVE STATEMENT:  RT calf still sore in one spot, left calf cramped last night. Since no PT last night went to gym and did okay ,vey sore Nustep,Leg Press and knee ext  PERTINENT HISTORY: Had new shoes, reached forward to the ground, shoes were taller, strained my R  calf PAIN:  Are you having pain? Yes: Pain location: R lateral lower leg Pain description: aches, deep ache Aggravating factors: sit to stand Relieving factors: rests  PRECAUTIONS: Other: using on demand O2, checked her own O2 sats throughout eval  WEIGHT BEARING RESTRICTIONS: No  FALLS:  Has patient fallen in last 6 months? No  LIVING ENVIRONMENT: Lives with: lives alone Lives in: House/apartment Stairs: Yes: Internal: 2 steps; can reach both Has following equipment at home: Single point cane2  OCCUPATION: retired  PLOF: Independent  PATIENT GOALS: to be more I, stop pain R leg, avoid falls   NEXT MD VISIT: 05/25/23  OBJECTIVE:   DIAGNOSTIC FINDINGS: na, patient reports x rays - R ankle  PATIENT SURVEYS:  LEFS 30/80  62.5% disability COGNITION: Overall cognitive status: Within functional limits for tasks assessed     SENSATION: WFL  EDEMA:  None noted  MUSCLE LENGTH: Hamstrings: Right wfl deg; Left wfl deg   POSTURE:  R hip ER in standing, overpronates B ankles,with gait, greater R than L R medial gastrocs atrophied compared to L  PALPATION: Tender , thickened R medial gastrocs muscle belly, non tender post knee tender R fibular head  LOWER EXTREMITY ROM:  Passive ROM Right eval Left eval RT 03/21/23  Hip flexion 90    Hip extension     Hip abduction     Hip adduction     Hip internal rotation     Hip external rotation     Knee flexion     Knee extension     Ankle dorsiflexion Ankle plantarflexion wfl wfl   Ankle inversion Wfl wfl   Ankle eversion Wfl wfl    (Blank rows = not tested)  LOWER EXTREMITY MMT:MMT B LE's equal and wnl.  Single leg heel raises 6 to 8 reps each  MMT Right eval Left eval  Hip flexion    Hip extension    Hip abduction    Hip adduction    Hip internal rotation    Hip external rotation    Knee flexion    Knee extension    Ankle dorsiflexion    Ankle plantarflexion    Ankle inversion    Ankle eversion      (Blank rows = not tested)  LOWER EXTREMITY SPECIAL TESTS:  Na today  FUNCTIONAL TESTS:  Single leg stance R and L 3 sec each  GAIT: Distance walked: 70' within department, rested and self monitored her pulse oxygen, dropped to 88 x 2 todaybut rebounded Assistive device utilized:  supplemental on demand O2 Level of assistance: Complete Independence Comments: na   TODAY'S TREATMENT:  DATE:    04/04/23 STW to BIL calfs and stretching to calfs and ankles DN by Octavio Graves PT STS on airex 10 x without UE On airex heel raises/toe raise 10 x. HHA alt LE marching 20 x HS curl 35# 2 sets 15 Knee ext 25 # 3 sets 10 Black Bar Heel raise /toe raise 20x each   03/23/23 Nustep L 4 5 min LE only 30# resisted gait 5 x 4 ways Black bar standing heel raise/toe raise 2 sets 10 HS curl 35# 2 sets 15 Knee ext 25 # 3 sets 10 6 inch step up 10 leading RT 10 x leading left CGA with cuing STS airex 3 sets 10 Red tband DF 2 sets 10 Seated for toe raises, red mediball between heels 20x STW and stretching to RT ankle and calf  03/21/23 HS curl 35# 2 sets 15 Knee ext 25 # 3 sets 10 Seated for toe raises, red mediball between heels 20x Seated dyna disc various ankle ROM  Black bar standing heel raise/toe raise 2 sets 10 In // bars on foam beams marching 20 x, tandem ( up and back 5 x) and side stepping 5 x Toe raies and heel raises 2 sets 10 , mini squats 2 sets 10 Red tband DF 2 sets 10 STW and stretching to RT ankle and calf     03/15/23: Seated multiplanar movements with R foot on blue disc  Seated B hamstring curls 35# B long arc quads 25#  Seated for toe raises, red mediball between heels 20x Seated toe raises orange theraband around ankles for isometric abduction 20 x Standing for toe raises orange theraband around ankles 25x Manual: Long sitting for  calcaneal traction , A/P glides subtalar jt, stretching into R ankle dorsiflexion R side lying for cross friction massage R medial gastrocnemius  03/13/23:  Manual: Long sitting for calcaneal traction , A/P glides subtalar jt, stretching into R ankle dorsiflexion R side lying for cross friction massage R medial gastrocnemius blue disc  Therapeutic exercise: Instructed in self Mobilization with movement, R foot on step, forward lunges with theraband placed across R ant ankle Standing for B plantarflexor stretch on wooden adjustable wedge Standing for toe raises with fore foot on black bar Standing for multiplanar R ankle AROM in closed chain/ weight bearing position on airex   03/08/23: Manual: R sidelying , cross friction mass and myofascial release R medial plantarflexors  Therapeutic exercise:   On adjustable wedge; in ll bars for B plantarflexor stretch and B toe raises 15x Staggered stance L foot on airex in ll bars, R foot on ground, with up/down head movements 15x Step for/ back, in ll bars, R foot planted on airex 15x 2 " step for L foot lateral heel taps to isolate eccentric control R plantarflexors , r lateral hip musculature, R VMO 15x Sitting RT foot on sit fit 4 way s 15 x each 15x Standing B toe raises with ball between ankles 15x, ll bars Seated theraband hamstring curls blue band, 30 reps  03/06/23:  Manual: R sidelying , cross friction mass and myofascial release R medial plantarflexors Long sitting for post glides R subtalar jt, with R ankle dorsiflexion stretch Lunges on step R LE for mobilization with movement R ankle dorsiflexion, subtalar joint, one bout of 10 reps  Therapeutic exercise:   Black bar  standing ankle Df and PF 2 sets 10 Calf and soleus stretch on black bar airex toe raise and heel raises Airex semitandem  standing R foot post, head movements up/down, side side 2 " step for L foot lateral heel taps to isolate eccentric control R plantarflexors , r  lateral hip musculature, R VMO  Sitting RT foot on sit fit 4 way s 15 x each  02/28/23 Black bar Df and PF 2 sets 10 Calf and soleus stretch on black bar On foam mat marching, toe raise and heel raises Sitting RT foot on sit fit 4 way s 15 x each Red tband ankle 4 way 2 sets 10 Ankle PROM and jt mobs to increase ROM Calf stretching and DSTW- talked about DN for next session Step stretch for calf and soleous   02/23/23 Instructed in therex as described below, also demod use of roller /rolling pin to self masage R medial calf     PATIENT EDUCATION:  Education details: POC Person educated: Patient Education method: Programmer, multimediaxplanation, Facilities managerDemonstration, Verbal cues, and Handouts Education comprehension: verbalized understanding, returned demonstration, and verbal cues required  HOME EXERCISE PROGRAM: Access Code: [email or web address] URL: [email or web address] Date: 02/23/2023 Prepared by: Linton RumpAmy Speaks  Exercises - Seated Calf Stretch with Strap  - 3 x daily - 7 x weekly - 1 sets - 3 reps - 30 hold - Long Sitting Ankle Plantar Flexion with Resistance  - 1 x daily - 7 x weekly - 2 sets - 10 reps  ASSESSMENT:  CLINICAL IMPRESSION pt arrived stating bad cramp in left calf last night. States RT leg still has knot and tried to return to gym since no PT last week and feels she over did. At pt request we tried DN with STW and stretching  OBJECTIVE IMPAIRMENTS: cardiopulmonary status limiting activity, decreased activity tolerance, decreased balance, decreased endurance, difficulty walking, decreased ROM, decreased strength, hypomobility, and pain.   ACTIVITY LIMITATIONS: carrying, lifting, bending, standing, squatting, stairs, transfers, and locomotion level  PARTICIPATION LIMITATIONS: meal prep, laundry, and community activity  PERSONAL FACTORS: Fitness and 1-2 comorbidities: pulmonary fibrosis, h/o R hip and pelvic fracture  are also affecting patient's functional outcome.   REHAB POTENTIAL:  Good  CLINICAL DECISION MAKING: Stable/uncomplicated  EVALUATION COMPLEXITY: Low   GOALS: Goals reviewed with patient? Yes  SHORT TERM GOALS: Target date: 03/09/23 I HEP Baseline: intiated today Goal status: IN PROGRESS  03/23/23 MET  LONG TERM GOALS: Target date: 5/23/242.  1.Improve R ankle Rom for dorsiflexion from 6 to 10 degrees or greater Baseline: 6 Goal status: IN PROGRESS  03/23/23 MET  2.  Improve unilateral balance to 10 sec or greater on each leg for reduced fall risk Baseline: 3 sec each leg Goal status: IN PROGRESS  03/23/23  3.  Able to complete unilateral heel raise R LE from 6 reps to 15 ore greater Baseline:  Goal status: IN PROGRESS 3 /28/24  PLAN:  PT FREQUENCY: 1-2x/week  PT DURATION: 12 weeks  PLANNED INTERVENTIONS: Therapeutic exercises, Therapeutic activity, Neuromuscular re-education, Balance training, Gait training, Patient/Family education, Self Care, and Joint mobilization  PLAN FOR NEXT SESSION assess goals and 10th visit    Tatyanna Cronk,ANGIE, PTA 04/04/2023, 2:45 PM [facility] [address] [place], [place], [address] Phone: [phone]   Fax:  [phone]  Patient Details  Name: Lucile CraterSusan Ann Mortimer MRN: [id number] Date of Birth: 03/03/1951 Referring Provider:  Willow OraAndy, Camille L, MD  Encounter Date: 04/04/2023 [facility] [address] [place], [place], [address] Phone: [phone]   Fax:  [phone]

## 2023-04-05 ENCOUNTER — Other Ambulatory Visit: Payer: Self-pay

## 2023-04-05 ENCOUNTER — Ambulatory Visit: Payer: Medicare Other

## 2023-04-05 DIAGNOSIS — R262 Difficulty in walking, not elsewhere classified: Secondary | ICD-10-CM | POA: Diagnosis not present

## 2023-04-05 DIAGNOSIS — M79661 Pain in right lower leg: Secondary | ICD-10-CM | POA: Diagnosis not present

## 2023-04-05 DIAGNOSIS — R2689 Other abnormalities of gait and mobility: Secondary | ICD-10-CM | POA: Diagnosis not present

## 2023-04-05 NOTE — Therapy (Signed)
OUTPATIENT PHYSICAL THERAPY LOWER EXTREMITY  Progress Note Reporting Period 02/23/23 to 04/05/23  See note below for Objective Data and Assessment of Progress/Goals.     Patient Name: Patty CraterSusan Ann [name] MRN: [id number] DOB:10/05/1951, 72 y.o., female Today's Date: 04/05/2023  END OF SESSION:  PT End of Session - 04/05/23 1525     Visit Number 10    Date for PT Re-Evaluation 05/18/23    PT Start Time 1445    PT Stop Time 1530    PT Time Calculation (min) 45 min    Activity Tolerance Patient tolerated treatment well    Behavior [facility] for tasks assessed/performed               Past Medical History:  Diagnosis Date   Abnormal nuclear stress test    a. 07/2017: cath with no CAD   Anxiety    Aortic atherosclerosis (HCC)    Arthritis    Chronic diastolic CHF (congestive heart failure) (HCC)    Coronary artery calcification seen on CT scan    Crohn disease (HCC)    DVT (deep venous thrombosis) (HCC)    Dyspnea    Grade I diastolic dysfunction    Hyperlipidemia    ILD (interstitial lung disease) (HCC)    OSA on CPAP    CPAP    AND O2   Pernicious anemia    Polycythemia vera (HCC)    Pulmonary embolism (HCC) 2010   s/p hip surgery , reports this was never truly confirmed    Pulmonary hypertension (HCC)    Sleep disorder 01/10/2020   Uses klonopin for sleep since 2014; sleep walking. And anxiety   Past Surgical History:  Procedure Laterality Date    RIGHT BREAST LUMPECTOMY WITH RADIOACTIVE SEED LOCALIZATION (Right Breast)  02/04/2021   BIOPSY  09/30/2019   Procedure: BIOPSY;  Surgeon: Beverley FiedlerPyrtle, Jay M, MD;  Location: [facility];  Service: Gastroenterology;;   BREAST LUMPECTOMY WITH RADIOACTIVE SEED LOCALIZATION Right 02/04/2021   Procedure: RIGHT BREAST LUMPECTOMY WITH RADIOACTIVE SEED LOCALIZATION;  Surgeon: Abigail MiyamotoBlackman, Douglas, MD;  Location: [facility];  Service: General;  Laterality: Right;   CARDIAC CATHETERIZATION     COLON RESECTION  1993   COLONOSCOPY  WITH PROPOFOL N/A 09/30/2019   Procedure: COLONOSCOPY WITH PROPOFOL;  Surgeon: Beverley FiedlerPyrtle, Jay M, MD;  Location: [facility];  Service: Gastroenterology;  Laterality: N/A;   HIP ARTHROPLASTY Right    x 2, initial right THA, then subsequent right THA revision    LEFT HEART CATH AND CORONARY ANGIOGRAPHY N/A 07/28/2017   Procedure: Left Heart Cath and Coronary Angiography;  Surgeon: Kathleene HazelMcAlhany, Christopher D, MD;  [facility];  Service: Cardiovascular;  Laterality: N/A;   POLYPECTOMY  09/30/2019   Procedure: POLYPECTOMY;  Surgeon: Beverley FiedlerPyrtle, Jay M, MD;  [facility];  Service: Gastroenterology;;   RIGHT HEART CATH N/A 12/07/2020   Procedure: RIGHT HEART CATH;  Surgeon: Tonny Bollmanooper, Michael, MD;  [facility];  Service: Cardiovascular;  Laterality: N/A;   Patient Active Problem List   Diagnosis Date Noted   Pulmonary emphysema with fibrosis of lung 01/31/2022   Sclerosing adenosis of right breast 02/04/2021   Prediabetes 02/01/2021   Pulmonary hypertension, unspecified 12/07/2020   Osteopenia 08/05/2020   Adjustment disorder with anxiety 04/29/2020   Sleep disorder 01/10/2020   Chronic allergic rhinitis 01/10/2020   Pernicious anemia 01/10/2020   Chronic prescription benzodiazepine use 01/10/2020   Benign neoplasm of rectum    Dyslipidemia 09/24/2019   Morbid  obesity 09/24/2019   History of DVT (deep vein thrombosis), with PE 07/25/2019   OSA on CPAP    Crohn disease 06/15/2017   ILD (interstitial lung disease) 05/29/2017   Chronic respiratory failure with hypoxia 05/29/2017   History of polycythemia vera 11/12/2014    PCP: Asencion Partridge, MD  REFERRING PROVIDER: Delfin Gant MD  REFERRING DIAG: R lower leg pain  THERAPY DIAG:  No diagnosis found.  Rationale for Evaluation and Treatment: Rehabilitation  ONSET DATE: August 2023  SUBJECTIVE:   SUBJECTIVE STATEMENT:  Responded well to the dry needling, didn't have as much weakness when I got up  this am.  PERTINENT HISTORY: Had new shoes, reached forward to the ground, shoes were taller, strained my R calf PAIN:  Are you having pain? Yes: Pain location: R lateral lower leg Pain description: aches, deep ache Aggravating factors: sit to stand Relieving factors: rests  PRECAUTIONS: Other: using on demand O2, checked her own O2 sats throughout eval  WEIGHT BEARING RESTRICTIONS: No  FALLS:  Has patient fallen in last 6 months? No  LIVING ENVIRONMENT: Lives with: lives alone Lives in: House/apartment Stairs: Yes: Internal: 2 steps; can reach both Has following equipment at home: Single point cane2  OCCUPATION: retired  PLOF: Independent  PATIENT GOALS: to be more I, stop pain R leg, avoid falls   NEXT MD VISIT: 05/25/23  OBJECTIVE:   DIAGNOSTIC FINDINGS: na, patient reports x rays - R ankle  PATIENT SURVEYS:  LEFS 30/80  62.5% disability COGNITION: Overall cognitive status: Within functional limits for tasks assessed     SENSATION: WFL  EDEMA:  None noted  MUSCLE LENGTH: Hamstrings: Right wfl deg; Left wfl deg   POSTURE:  R hip ER in standing, overpronates B ankles,with gait, greater R than L R medial gastrocs atrophied compared to L  PALPATION: Tender , thickened R medial gastrocs muscle belly, non tender post knee tender R fibular head  LOWER EXTREMITY ROM:  Passive ROM Right eval Left eval RT 03/21/23 04/05/23 R  Hip flexion 90     Hip extension      Hip abduction      Hip adduction      Hip internal rotation      Hip external rotation      Knee flexion      Knee extension      Ankle dorsiflexion 6 12 12 10   Ankle plantarflexion wfl wfl    Ankle inversion Wfl wfl    Ankle eversion Wfl wfl     (Blank rows = not tested)  LOWER EXTREMITY MMT:MMT B LE's equal and wnl.  Single leg heel raises 15 reps L, 13 reps R each FUNCTIONAL TESTS:  Single leg stance R and L 3 sec each  GAIT: Distance walked: 21' within department, rested and self  monitored her pulse oxygen, dropped to 88 x 2 todaybut rebounded Assistive device utilized:  supplemental on demand O2 Level of assistance: Complete Independence Comments: na   TODAY'S TREATMENT:  DATE:   04/05/23:  HS curl 35# 2 sets 15 Knee ext 25 # 3 sets 10 Standing on airex, toe raises at counter Standing on airex tandem, with vertical head movements and rotation Leg press: 30#, 3 sets 15 reps Black bar, heel raises, cues for upright posture.  Sit to stand from mat table, with 2 # mediball and feet on airex, 5 reps.    04/04/23 STW to BIL calfs and stretching to calfs and ankles DN by Octavio Graves PT STS on airex 10 x without UE On airex heel raises/toe raise 10 x. HHA alt LE marching 20 x HS curl 35# 2 sets 15 Knee ext 25 # 3 sets 10 Black Bar Heel raise /toe raise 20x each   03/23/23 Nustep L 4 5 min LE only 30# resisted gait 5 x 4 ways Black bar standing heel raise/toe raise 2 sets 10 HS curl 35# 2 sets 15 Knee ext 25 # 3 sets 10 6 inch step up 10 leading RT 10 x leading left CGA with cuing STS airex 3 sets 10 Red tband DF 2 sets 10 Seated for toe raises, red mediball between heels 20x STW and stretching to RT ankle and calf  03/21/23 HS curl 35# 2 sets 15 Knee ext 25 # 3 sets 10 Seated for toe raises, red mediball between heels 20x Seated dyna disc various ankle ROM  Black bar standing heel raise/toe raise 2 sets 10 In // bars on foam beams marching 20 x, tandem ( up and back 5 x) and side stepping 5 x Toe raies and heel raises 2 sets 10 , mini squats 2 sets 10 Red tband DF 2 sets 10 STW and stretching to RT ankle and calf     03/15/23: Seated multiplanar movements with R foot on blue disc  Seated B hamstring curls 35# B long arc quads 25#  Seated for toe raises, red mediball between heels 20x Seated toe raises orange theraband around  ankles for isometric abduction 20 x Standing for toe raises orange theraband around ankles 25x Manual: Long sitting for calcaneal traction , A/P glides subtalar jt, stretching into R ankle dorsiflexion R side lying for cross friction massage R medial gastrocnemius  03/13/23:  Manual: Long sitting for calcaneal traction , A/P glides subtalar jt, stretching into R ankle dorsiflexion R side lying for cross friction massage R medial gastrocnemius blue disc  Therapeutic exercise: Instructed in self Mobilization with movement, R foot on step, forward lunges with theraband placed across R ant ankle Standing for B plantarflexor stretch on wooden adjustable wedge Standing for toe raises with fore foot on black bar Standing for multiplanar R ankle AROM in closed chain/ weight bearing position on airex   03/08/23: Manual: R sidelying , cross friction mass and myofascial release R medial plantarflexors  Therapeutic exercise:   On adjustable wedge; in ll bars for B plantarflexor stretch and B toe raises 15x Staggered stance L foot on airex in ll bars, R foot on ground, with up/down head movements 15x Step for/ back, in ll bars, R foot planted on airex 15x 2 " step for L foot lateral heel taps to isolate eccentric control R plantarflexors , r lateral hip musculature, R VMO 15x Sitting RT foot on sit fit 4 way s 15 x each 15x Standing B toe raises with ball between ankles 15x, ll bars Seated theraband hamstring curls blue band, 30 reps  03/06/23:  Manual: R sidelying , cross friction mass and myofascial release  R medial plantarflexors Long sitting for post glides R subtalar jt, with R ankle dorsiflexion stretch Lunges on step R LE for mobilization with movement R ankle dorsiflexion, subtalar joint, one bout of 10 reps  Therapeutic exercise:   Black bar  standing ankle Df and PF 2 sets 10 Calf and soleus stretch on black bar airex toe raise and heel raises Airex semitandem standing R foot post,  head movements up/down, side side 2 " step for L foot lateral heel taps to isolate eccentric control R plantarflexors , r lateral hip musculature, R VMO  Sitting RT foot on sit fit 4 way s 15 x each  02/28/23 Black bar Df and PF 2 sets 10 Calf and soleus stretch on black bar On foam mat marching, toe raise and heel raises Sitting RT foot on sit fit 4 way s 15 x each Red tband ankle 4 way 2 sets 10 Ankle PROM and jt mobs to increase ROM Calf stretching and DSTW- talked about DN for next session Step stretch for calf and soleous   02/23/23 Instructed in therex as described below, also demod use of roller /rolling pin to self massage R medial calf     PATIENT EDUCATION:  Education details: POC Person educated: Patient Education method: Programmer, multimedia, Facilities manager, Verbal cues, and Handouts Education comprehension: verbalized understanding, returned demonstration, and verbal cues required  HOME EXERCISE PROGRAM: Access Code: [email or web address] URL: [email or web address] Date: 02/23/2023 Prepared by: Linton Rump Jeyla Bulger  Exercises - Seated Calf Stretch with Strap  - 3 x daily - 7 x weekly - 1 sets - 3 reps - 30 hold - Long Sitting Ankle Plantar Flexion with Resistance  - 1 x daily - 7 x weekly - 2 sets - 10 reps  ASSESSMENT:  CLINICAL IMPRESSION   Reassessed today for 10th visit progress note.  The patient has improved in all areas, flexibility, balance, strength R ankle/calf.  Responded well to the dry needling as well.  Wishes to continue to progress with PT through her remaining treatment plan.  No DN today due to 2 days of Rx in a row. OBJECTIVE IMPAIRMENTS: cardiopulmonary status limiting activity, decreased activity tolerance, decreased balance, decreased endurance, difficulty walking, decreased ROM, decreased strength, hypomobility, and pain.   ACTIVITY LIMITATIONS: carrying, lifting, bending, standing, squatting, stairs, transfers, and locomotion level  PARTICIPATION LIMITATIONS:  meal prep, laundry, and community activity  PERSONAL FACTORS: Fitness and 1-2 comorbidities: pulmonary fibrosis, h/o R hip and pelvic fracture  are also affecting patient's functional outcome.   REHAB POTENTIAL: Good  CLINICAL DECISION MAKING: Stable/uncomplicated  EVALUATION COMPLEXITY: Low   GOALS: Goals reviewed with patient? Yes  SHORT TERM GOALS: Target date: 03/09/23 I HEP Baseline: intiated today Goal status: IN PROGRESS  03/23/23 MET  LONG TERM GOALS: Target date: 5/23/242.  1.Improve R ankle Rom for dorsiflexion from 6 to 10 degrees or greater Baseline: 6 Goal status: IN PROGRESS  03/23/23 MET 04/05/23: 10 degrees met so far  2.  Improve unilateral balance to 10 sec or greater on each leg for reduced fall risk Baseline: 3 sec each leg Goal status: IN PROGRESS  03/23/23 04/05/23: goal progressing so far, 10 sec L leg, 6 R    3.  Able to complete unilateral heel raise R LE from 6 reps to 15 ore greater Baseline:  Goal status: IN PROGRESS 3 /28/24 04/05/23: 14 reps R, 15 reps L  PLAN:  PT FREQUENCY: 1-2x/week  PT DURATION: 12 weeks  PLANNED INTERVENTIONS: Therapeutic exercises, Therapeutic activity, Neuromuscular re-education,  Balance training, Gait training, Patient/Family education, Self Care, and Joint mobilization  PLAN FOR NEXT SESSION continue to progress with overall LE strrength and balance, dry needling as needed   Danilynn Jemison L Korrey Schleicher, PT 04/05/2023, 5:13 PM [facility] at [facility] [address] [facility] [facility] [address] [place], [place], [address] Phone: [phone]   Fax:  [phone]  Patient Details  Name: Wandra Babin MRN: [id number] Date of Birth: 01-Jul-1951 Referring Provider:  Willow Ora, MD  Encounter Date: 04/05/2023 [facility] [address] [place], [place], [address] Phone: [phone]   Fax:  [phone]

## 2023-04-06 ENCOUNTER — Encounter: Payer: Self-pay | Admitting: Internal Medicine

## 2023-04-06 ENCOUNTER — Ambulatory Visit (INDEPENDENT_AMBULATORY_CARE_PROVIDER_SITE_OTHER): Payer: Medicare Other | Admitting: Internal Medicine

## 2023-04-06 VITALS — BP 124/82 | HR 90 | Temp 98.2°F | Ht 64.0 in | Wt 263.0 lb

## 2023-04-06 DIAGNOSIS — R000 Tachycardia, unspecified: Secondary | ICD-10-CM | POA: Diagnosis not present

## 2023-04-06 DIAGNOSIS — Z87891 Personal history of nicotine dependence: Secondary | ICD-10-CM | POA: Diagnosis not present

## 2023-04-06 DIAGNOSIS — J9611 Chronic respiratory failure with hypoxia: Secondary | ICD-10-CM | POA: Diagnosis not present

## 2023-04-06 DIAGNOSIS — J8410 Pulmonary fibrosis, unspecified: Secondary | ICD-10-CM

## 2023-04-06 DIAGNOSIS — I2723 Pulmonary hypertension due to lung diseases and hypoxia: Secondary | ICD-10-CM | POA: Diagnosis not present

## 2023-04-06 DIAGNOSIS — J439 Emphysema, unspecified: Secondary | ICD-10-CM | POA: Diagnosis not present

## 2023-04-06 MED ORDER — ALBUTEROL SULFATE HFA 108 (90 BASE) MCG/ACT IN AERS: 2.0000 | INHALATION_SPRAY | Freq: Four times a day (QID) | RESPIRATORY_TRACT | 6 refills | Status: DC | PRN

## 2023-04-06 NOTE — Progress Notes (Signed)
_0  ID: Patty [name], female    DOB: [birth year], 72 y.o.   MRN: [id number]  Chief Complaint  Patient presents with   Follow-up    dyspnea     Referring provider: No ref. provider found  HPI: 72 year old female former smoker seen for pulmonary consulokay guarded because then maybe they can meet him right now for the registryt 05/09/2017 for progressive dyspnea since 2004.Patty [name] Patient has polycythemia vera followed by hematology and [place] She has Crohn's disease    IOV  05/09/2017  Chief Complaint  Patient presents with   Pulmonary Consult    Pt referred for SOB with activity x years. Pt states over the last year the DOE has worsened. Pt denies cough and CP/tightness and f/c/s.     72 year old obese female. In 2004 Started noticing insidious onset of shortness of breath. In 2005 more [place] from [place] and probably gain over 60 pounds of weight. She has extensive workup at that time according to her history. She was then diagnosed with polycythemia by Dr. [name] at [facility]. She does not recollect any phlebotomies for this. However in the last 4-5 years she's had worsening shortness of breath. Noticeable with exertion particularly in the gym and doing standing exercises but not so much with sitting exercises. Relieved by rest. When she does some yard work she notices some associated wheezing as well.  Walking desaturation test in the office she did desaturate to 88% and did get tachycardic. She says that a chest x-ray done by primary care physicians interstitial pulmonary fibrosis. I personally visualized the chest x-ray done 04/03/2017: There is some interstitial markings but it is obscured by her obesity. I'm not so certain that is definite ILD. There is no lab work in the record. There is no PFT a CT scan available for visualization     05/29/2017 Follow up : Dyspnea  Patient returns for a two-week follow-up. Patient was seen for pulmonary consult  05/09/2017 for progressive dyspnea since 2004. Patient was set up for a high resolution CT chest that showed a very mild basilar predominant subpleural reticulation and bronchiectasis which could be due to nonspecific interstitial pneumonitis.. CT did have incidental findings of enlarged pulmonary arteries and aortic and coronary calcification. Patient had pulmonary function test on May 23 that showed an FEV1 at 77%, ratio 73, FVC 81, no significant bronchodilator response, total lung capacity 100%, DLCO 52%. Overnight oximetry test did show significant desaturations. We discussed beginning oxygen at bedtime.  Patient denies significant cough. Says that she get short of breath with walking. Has daytime fatigue and low energy..  Interstitial lung disease patient questionnaire was completed as follows Patient has no previous use of Macrodantin, amiodarone, methotrexate. She has been treated with prednisone for her Crohn's in the past. Patient has had extensive travel with brief visits to multiple state and country's.. [place]. Did live in a apartment that she did notice mold for 4 years. She does have Crohn's disease and was on Mesamaline for many years. Patient says she has had a humidifier and hot tub and previous house. She has no indoor birds. She does have a dog. Patient says she was told that she had a blood clot in the past but was never found.   OV 08/15/2017  Chief Complaint  Patient presents with   Follow-up    Pt still gets occ. SOB. Other than that, pt states that she has been doing good.  Denies any cough or CP.     Follow-up multifactorial dyspnea with interstitial lung disease concern.   Regarding interstitial lung disease concern: Mid May 2018 she had high resolution CT chest that showed interstitial lung disease very mild but unclear if possible UIP pattern. Autoimmune test was negative. Isolated reduction in diffusion capacity 50%. [employer] of chest  physicians questionnaire shows previous Crohn's disease and also mold exposure previously. Overall she feels stable  Regarding dyspnea: She underwent echocardiogram in June 2018 showed grade 1 diastolic dysfunction. Had abnormal cardiac stress test July 2018 followed by cardiac cath early August 2018 that showed normal coronary angiogram but elevated left ventricular end-diastolic pressure. Dietary therapy has been recommended but she is unaware of these results.  Overall she is here to discuss these results.  IMPRESSION: 1. Suspect very mild basilar predominant subpleural reticulation and bronchiolectasis, which may be due to nonspecific interstitial pneumonitis. 2. Aortic atherosclerosis (ICD10-170.0). Coronary artery calcification. 3. Enlarged pulmonary arteries, indicative of pulmonary arterial hypertension.     Electronically Signed   By: Lorin Picket M.D.   On: 05/13/2017 07:33    OV 10/17/2017  Chief Complaint  Patient presents with   Follow-up    Pt states that she has had good days and bad days since last visit. States that when she was in [place] for a month, breathing was better; still became SOB but not as bad as in [place]. Pt's SOB is mainly on exertion. Denies any cough or CP.    Follow-up dyspnea that is multifactorial due to obesity, physical deconditioning and diastolic dysfunction Follow-up mild interstitial lung disease not otherwise specified  Last visit I started her on Lasix. I was only supposed to see herin 6 months or so. However she's had problems tolerating Lasix and potassium. It fluctuates between palpitations and hypertension. She feels the palpitations might be related to potassium depletion after taking Lasix. But she also tells me that the Lasix does help her dyspnea. At this point in time her condition is to see cardiologist. Off note she did send the email message asking these questions on 09/28/2017 made a reply that she tells me that she never got  the reply.   OV 02/13/2018  Chief Complaint  Patient presents with   Follow-up    PFT done 02/05/18.  Pt states she has been doing good. Was sick in january but states she is doing better.  DME: [facility] 4L pulse    Follow-up dyspnea that is multifactorial due to obesity, physical deconditioning and diastolic dysfunction Follow-up mild interstitial lung disease not otherwise specified v concern   72 year old female with concern for interstitial lung disease.  Since her last visit she continues to do well.  She is attending pulmonary rehabilitation.  She feels better and less short of breath.  She uses oxygen with exertion at rehab saying she needs it.  She has upcoming travel in summer to [place] and wanted some flight information oxygen form filled out.  She is wondering if mesalamine that she took several years ago was the cause of interstitial lung disease or her Crohn's disease.  But overall she is stable.  Pulmonary function test shows stability and FVC since last 1 year with some improvement in DLCO.  High-resolution CT scan of the chest interpreted by thoracic radiology report suspected ILD.   OV 08/28/2018  Chief Complaint  Patient presents with   Follow-up    PFT performed today.  Pt states she has had both good and bad  days since last visit. Pt denies any complaints of cough, SOB, or CP but states she is having some problems with congestion since she has been back from vacation. Pt has been having problems with getting O2 supplies taken care of with DME.    Follow-up dyspnea that is multifactorial due to obesity, physical deconditioning and diastolic dysfunction Follow-up concern for  mild interstitial lung disease not otherwise specified  Patty [name] - Presents for follow-up for the above issues. Her dyspnea significantly better following a 30 pound weight loss and continued rehabilitation. She uses oxygen with rehabilitation. Today walking desaturation test 185 feet 3 laps on room  air: She dropped to 87% on the second lap. But she is feeling better. She had spirometry today that shows improvement in FVC concomitant with weight loss but no change in diffusion capacity that reflects the presence of ongoing possible ILD.She continues to lose more weight she believes that she could lose another 30 pounds before the next visit.     OV 03/12/2019  Subjective:  Patient ID: Patty [name], female , DOB: April 17, 1951 , age 53 y.o. , MRN: [id number] , ADDRESS: [place] [id number]   03/12/2019 -   Chief Complaint  Patient presents with   Follow-up    PFT performed today.  Pt states she has been doing good since last visit. States she still becomes SOB with exertion, and has phlegm in the mornings, postnasal drainage which has used flonase to help. Pt also uses POC as needed.    Follow-up dyspnea that is multifactorial due to obesity, physical deconditioning and diastolic dysfunction Follow-up  mild interstitial lung disease not otherwise specifieds -with stability 2018 through 2020 [non-IPF pattern]; on observation   HPI Aseneth Hack 72 y.o. -presents for follow-up.  She is attending pulmonary rehabilitation and using oxygen with exertion.  Overall she feels stable.  Subjective symptom parameters and objective parameters are documented below.  She had high-resolution CT chest that showed shows that she has ILD.  At this point we can be confident that she has mild ILD but it is stable based on symptoms walking test and pulmonary function test.  The previous autoimmune test was negative.      OV 10/07/2019  Subjective:  Patient ID: Patty [name], female , DOB: February 17, 1951 , age 30 y.o. , MRN: [id number] , ADDRESS: [place] [id number]   10/07/2019 -   Chief Complaint  Patient presents with   Follow-up     Follow-up dyspnea that is multifactorial due to obesity, physical deconditioning and diastolic dysfunction Follow-up  mild  interstitial lung disease not otherwise specifieds -with stability 2018 through 2020 [non-IPF pattern]; on observation Follow-up mild pulmonary emphysema present on high-resolution CT chest  HPI Candance Bohlman 72 y.o. -presents for follow-up.  Prior visit was pre-pandemic.  Since then she has been sedentary at home.  She tells me that she is more short of breath.  The symptom scores below reflect that.  She thinks is because of the 30 pound weight gain because of sedentary living and social isolation following the onset of the pandemic.  She is frustrated by this.  Deep down she thinks her ILD is stable.  Pulmonary function test shows a drop in FVC but stability and DLCO suggesting weight gain is an ongoing issue for worsening dyspnea and pulmonary function test.  She is willing to have a high-dose flu shot today.  She also tells me that she is not taking her  Spiriva because it was expensive.  Although it did help her she is willing to try again and try to price it.  She also stated that her mom who never smoked had emphysema but her dad smoked heavily.  She has never been tested for alpha-1.   [place] destats -dropped. But tries not to use it in public    IMPRESSION: 1. Very mild scattered basilar subpleural reticulation and ground-glass similar to 01/29/2018. Nonspecific interstitial pneumonitis can not be excluded. Findings are indeterminate for UIP per consensus guidelines: Diagnosis of Idiopathic Pulmonary Fibrosis: An Official ATS/ERS/JRS/ALAT Clinical Practice Guideline. Am Rosezetta SchlatterJ Respir Crit Care Med Vol 198, Iss 5, [id number], Aug 26 2017. 2. Question cirrhosis. 3. Aortic atherosclerosis (ICD10-170.0). Coronary artery calcification. 4. Enlarged arteries, arterial hypertension. 5.  Emphysema (ICD10-J43.9).     Electronically Signed   By: Leanna BattlesMelinda  Blietz M.D.   On: 03/06/2019 16:59   ROS - per HPI     OV 06/23/2020  Subjective:  Patient ID: Patty CraterSusan Ann Medaglia, female ,  DOB: 09/26/1951 , age 72 y.o. , MRN: [id number] , [address]: [address] [place] [id number]   06/23/2020 -   Chief Complaint  Patient presents with   Follow-up    shortness of breath with exertion   Follow-up dyspnea that is multifactorial due to obesity, physical deconditioning and diastolic dysfunction  Follow-up  mild interstitial lung disease not otherwise specifieds -with stability 2018 through 2020 [non-IPF pattern/indeterminate   -; on observation  - last CT Mach 2020 0> July 2021 wthout progression  Follow-up mild pulmonary emphysema present on high-resolution CT chest - alpha 1 MM  - o2 with exertion  - advised spiriva oct 2020 but too expensive  HPI Patty CraterSusan Ann Torosyan 72 y.o. -returns for routine follow-up.  She says she has been doing well.  She uses portable oxygen cranks it up to 4 L - 5 L and then walks every day for 30 minutes.  Overall she feels stable.  Her symptom score itself is stable.  However on pulmonary function testing her FVC shows a decline.  This decline started in October 2020 and since then it is stable.  She attributes this to weight gain.  There is what she told me last time as well.  However when we walked her she seemed to desaturate much more easily.  We also needed to corrected desaturation at 5 L.  She thinks it is because the portable oxygen system is not delivering oxygen currently and is a technical issue with a portable machine.  However the distance of desaturation seems to worsen.  There is no leg swelling although she has some mild varicose veins at baseline.  She is not on nighttime oxygen and she recollects attest that this some years ago and was normal back then.  Currently she is only using portable oxygen.  Her last echocardiogram was few years ago.  There is no hemoptysis or worsening cough.  She got a Covid vaccine in February 2021 and she thinks things might of changed since then.   After she left I was able to review the home  palliative care note from 01/11/2022:.  She expressed her life wishes that she does not want to be put on the ventilator.  Does not want a feeding tube.  She likes to pass away peacefully like her mother a DNR and MOST form were given.  They will address this in the future with her.  Results for Marliss CootsHABERMANN, Yuliana ANN (MRN [id number]) as of  06/23/2020 11:49  Ref. Range 05/17/2017 13:11 02/05/2018 12:14 08/28/2018 11:22 03/12/2019 14:47 10/01/2019 15:58 30 pound weight gain 06/09/2020 11:25  FVC-Pre Latest Units: L 2.61 2.57 2.80 2.73 2.38 2.42  FVC-%Pred-Pre Latest Units: % 84 83 94 91 81 82  FEV1-Pre Latest Units: L 1.83 1.71 1.96 1.83 1.65 1.53   Results for FALESHA, SCHOMMER ANN (MRN [id number]) as of 06/23/2020 11:49  Ref. Range 05/17/2017 13:11 02/05/2018 12:14 08/28/2018 11:22 03/12/2019 14:47 10/01/2019 15:58 06/09/2020 11:25  DLCO unc Latest Units: ml/min/mmHg 12.64 15.15 14.53 13.77 14.39 13.48  DLCO unc % pred Latest Units: % 52 62 62 72 75 70    ROS - per HPI     OV 02/23/2021  Subjective:  Patient ID: Patty [name], female , DOB: 03/18/1951 , age 38 y.o. , MRN: [id number] , ADDRESS: [address] [place] [address] PCP Willow Ora, MD Patient Care Team: Willow Ora, MD as PCP - General (Family Medicine) Pyrtle, Carie Caddy, MD as Consulting Physician (Gastroenterology) Shamleffer, Konrad Dolores, MD as Consulting Physician (Endocrinology) Kalman Shan, MD as Consulting Physician (Pulmonary Disease)  This Provider for this visit: Treatment Team:  Attending Provider: Kalman Shan, MD   Follow-up dyspnea that is multifactorial due to obesity, physical deconditioning and diastolic dysfunction, ILD and new dx Group 3 PAH - dec 2021  45 pppd prior smoking hix  Follow-up  mild interstitial lung disease not otherwise specifieds -with stability 2018 through 2020 [non-IPF pattern/indeterminate   -; on observation  - last CT Mach 2020 - > July 2021 without  progression  Inconsisent dxx of  pulmonary emphysema present on high-resolution CT chest - alpha 1 MM  - o2 with exertion  - advised spiriva oct 2020 but too expensive  Reported in only 1 CT. No mention in July 2021 CT  WHO group 3 Pulm htn with elevated PCWD - [facility] 12/07/20:   RA 13/12 mean 9 mmHg RV 48/8, EDP 12 PA 50/29 mean 38 PCWP 22/20 mean 20   O2 saturations: PA 62, Ao 97, SVC 64   CO 4.26 L/m, CI 1.96 Trans-pulmonic gradient 18 mmHg PVR 4.2 Woods Units  Grade 1 Disast dysfhn  - reportred July 2021 echo  Obesity  02/23/2021 -   Chief Complaint  Patient presents with   Follow-up    Doing ok     HPI Latrica Clowers 72 y.o. -returns for 26-month follow-up.  She has inconsistent diagnosis of emphysema.  Was reported in 1 scan but not another scans.  She has ILD.  She is foregoing biopsy.  She tells me overall that she is stable.  Although there are some weight gain.  She feels in the interim Metformin messed up her GI system and cause weight gain but she is glad to be out of it.  She had 6-minute walk test on 02/15/2021 at pulmonary rehabilitation.  She walked 7 and 15 feet in 6 minutes.  She did not break at all.  Her pulse ox dropped below 88% 2 times.  She corrected with 4 L of oxygen.  Her resting oxygen saturation was 96%.  And lowest was 86%.  She also had right heart catheterization in December 2021.  She has elevated pulmonary pressures but also elevated wedge.  Cardiology is following this up and she is on diuresis.  We spent some time discussing her care option.  She had pulmonary function test that shows stability in the last year and a half but declined in 2 years  time.  Her high-resolution CT scan of the chest done in summer 2021 shows stability over 4 years.  She is not interested in antifibrotic's because of her colitis and also given her stability.  She does not want to go through pulmonary lung biopsy given the risks  She generally tries to avoid  medications.  She is happy going to pulmonary rehabilitation.  We discussed inhaled treprostinil as approved therapy for pulmonary hypertension -expressed to her the overall safety profile for over 20 years and approved recently for pulmonary hypertension and WHO group 3.  Explained the minimal side effect risk but she does not want to go through this  We discussed the option of participating in a clinical trial.  We discussed what is called the PULSE inhaled nitric oxide device study.  This is a 28-week plus study.  It was a device and a nitric oxide supply study.  She is actually interested in this.  She is participating clinical trials.  She understands the voluntary nature of this.  She understands the risks that come with clinical trials.  She understands the control of risk through close follow-up and interventions..  However her 6-minute walk test is quite adequate and also she starting pulmonary rehabilitation so she would not be able to participate in this trial for several months.  In addition she is aware that inhaled treprostinil which is standard of care therapy is currently on exclusion in this protocol current amendment.  She understands if she qualifies and chooses this trial that this would be a limitation.  We have given her the consent for review but at this time she does not qualify for this trial.  HRCT July 2021   IMPRESSION: 1. Spectrum of findings suggestive of a mild basilar predominant fibrotic interstitial lung disease without appreciable interval progression since baseline 05/12/2017 high-resolution chest CT. Favor NSIP, with UIP not excluded. Findings are indeterminate for UIP per consensus guidelines: Diagnosis of Idiopathic Pulmonary Fibrosis: An Official ATS/ERS/JRS/ALAT Clinical Practice Guideline. Am Rosezetta Schlatter Crit Care Med Vol 198, Iss 5, [phone], Aug 26 2017. 2. Dilated main pulmonary artery, stable, suggesting chronic pulmonary arterial hypertension. 3. Aortic  Atherosclerosis (ICD10-I70.0).     Electronically Signed   By: Delbert Phenix M.D.   On: 07/10/2020 15:50     OV 05/31/2021  Subjective:  Patient ID: Patty [name], female , DOB: June 02, 1951 , age 49 y.o. , MRN: [id number] , ADDRESS: [address] [place] [address] PCP Willow Ora, MD Patient Care Team: Willow Ora, MD as PCP - General (Family Medicine) Pyrtle, Carie Caddy, MD as Consulting Physician (Gastroenterology) Shamleffer, Konrad Dolores, MD as Consulting Physician (Endocrinology) Kalman Shan, MD as Consulting Physician (Pulmonary Disease)  This Provider for this visit: Treatment Team:  Attending Provider: Kalman Shan, MD    Follow-up dyspnea that is multifactorial due to obesity, physical deconditioning and diastolic dysfunction, ILD and new dx Group 3 PAH - dec 2021  45 pppd prior smoking hix  Follow-up  mild interstitial lung disease not otherwise specifieds -with stability 2018 through 2020 [non-IPF pattern/indeterminate   -on observation  -last CT Mach 2020 - > July 2021 without progression  Inconsisent dxx of  pulmonary emphysema present on high-resolution CT chest - alpha 1 MM  - o2 with exertion  - advised spiriva oct 2020 but too expensive  Reported in only 1 CT. No mention in July 2021 CT  WHO group 3 Pulm htn with elevated PCWD - [facility] 12/07/20:  RA 13/12 mean 9 mmHg RV 48/8, EDP 12 PA 50/29 mean 38 PCWP 22/20 mean 20   O2 saturations: PA 62, Ao 97, SVC 64   CO 4.26 L/m, CI 1.96 Trans-pulmonic gradient 18 mmHg PVR 4.2 Woods Units  Grade 1 Disast dysfhn  - reportred July 2021 echo  Obesity   05/31/2021 -   Chief Complaint  Patient presents with   Follow-up    Pt states she has been doing okay since last visit. States she has begun pulmonary rehab which has helped.     HPI Clois Montavon 72 y.o. -returns for follow-up.  She continues on supportive care with oxygen.  Room air at rest 3-5 L pulsed with  oxygen.  For the last few months she is on pulsed oxygen.  She uses 2 L nasal cannula at night with her CPAP.  She is completed pulmonary rehabilitation.  For the last few months she is using pulsed oxygen.  She feels this works better.  She is [facility] [employer] [facility].  She does elliptical or treadmill.  She feels her symptoms have improved.  She is continue to gain some weight though.  She again reported that she feels sensitive to medication and wants avoid antifibrotic's.  She was interested this time and discussing treprostinil versus pulsed research protocol for patients with interstitial lung disease and hypoxemic respiratory failure.  She has pulmonary hypertension based on 2021 December right heart catheterization.  We discussed the long-term safety profile of inhaled treprostinil.  Discussed the side effect profile.  After reflection about the fact the increase study showed improvement in 6-minute walk test, subgroup analysis potential modification of ILD and other composite outcome improvement.     IMPRESSION: 1. Spectrum of findings suggestive of a mild basilar predominant fibrotic interstitial lung disease without appreciable interval progression since baseline 05/12/2017 high-resolution chest CT. Favor NSIP, with UIP not excluded. Findings are indeterminate for UIP per consensus guidelines: Diagnosis of Idiopathic Pulmonary Fibrosis: An Official ATS/ERS/JRS/ALAT Clinical Practice Guideline. [place], Iss 5, [phone], Aug 26 2017. 2. Dilated main pulmonary artery, stable, suggesting chronic pulmonary arterial hypertension. 3. Aortic Atherosclerosis (ICD10-I70.0).     Electronically Signed   By: Ilona Sorrel M.D.   On: 07/10/2020 15:50   OV 09/07/2021  Subjective:  Patient ID: Patty [name], female , DOB: April 09, 1951 , age 64 y.o. , MRN: [id number] , ADDRESS: [place] [address] PCP [name], MD Patient Care  Team: [name], MD as PCP - General (Family Medicine) Hilarie Fredrickson, Lajuan Lines, MD as Consulting Physician (Gastroenterology) [facility], Melanie Crazier, MD as Consulting Physician (Endocrinology) Brand Males, MD as Consulting Physician (Pulmonary Disease)  This Provider for this visit: Treatment Team:  Attending Provider: Brand Males, MD  Chronic hypoxemic respiratory failure due to all of the below ILD (interstitial lung disease) [facility])  -  Decline PF March 2020 through February 2022  but stable October 2020 through February 2022 on pulmonary function test -Stable on CT scan of the chest 2018 through July 2021 -Non-IPF likely - such as NSIP -Overall supportive care of plan without antifibrotic's given history of colitis [antifibrotic's can exacerbate this situation] and prior stability -You are using room air at rest with 3-5 L with exertion -pulse oxygen for the last few months    WHO group 3 pulmonary arterial hypertension ([place]) with elevaed PCWP to 20 on Dec 2021 - started tyvaso July 2022  [place] group 3  pulmonary arterial hypertension ([place]) with elevaed PCWP to 20 on Dec 2021 - -Continue CPAP at ngith with 2L [place] at night  09/07/2021 -   Chief Complaint  Patient presents with   Follow-up    PFT performed today.  States that she still becomes SOB with exertion. Pt is now on Tyvaso and states she finds herself needing her O2 more.     HPI Raelea Gosse 72 y.o. -returns for follow-up.  She started treprostinil in July 2022.  She says initially started doing better.  But once she got to 7 puffs 4 times daily she started getting more short of breath.  She felt overall that she is able to take a deep breath and feel less fatigued but when she got to that dose she said 1 time she walked across the room without oxygen which she normally walks without oxygen and she felt dizzy and winded.  Since then the symptoms have persisted.  She is also gained significant amount of  weight.  She is gained 16 pounds.  Her pulmonary function test is worse with a decline in both FVC and DLCO.  She says she is now needing 6 L of nasal cannula oxygen when she exercises.  Previously 5 L nasal cannula.  Symptom score wise and walking desaturation test wise she seems the same.  She does not want to do antifibrotic's because of prior history of colectomy.  At this point she is not sure if the treprostinil is helping her or hurting her or not doing anything.  OV 10/19/2021  Subjective:  Patient ID: Patty [name], female , DOB: [birth year] , age 10 y.o. 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 - diagnosed Sept 2022 -Non-IPF likely - such as NSIP -  Decline PF March 2020 through February 2022  but stable October 2020 through February 2022  -> declined sept 2022 PFT -Stable on CT scan of the chest 2018 through July 2021 -You are using room air at rest with 3-5 L with exertion -pulse oxygen for the last few months - started esbriet     WHO group 3 pulmonary arterial hypertension ([place]) with elevaed PCWP to 20 on Dec 2021 - started tyvaso July 2022 -> stopped sept 2022 of concern PFT declined could ave been due to tyvaso -> but restarted later in sept 2022   [place] group 3 pulmonary arterial hypertension ([place]) with elevaed PCWP to 20 on Dec 2021 - -Continue CPAP at ngith with 2L [place] at night   10/19/2021 -  followup  ILD   Type of visit: Telephone/Video Circumstance: COVID-19 national emergency Identification of patient Dorcus Riga with [birth year] and MRN [id number] - 2 person identifier Risks: Risks, benefits, limitations of telephone visit explained. Patient understood and verbalized agreement to proceed Anyone else on call: just patient Patient location: [phone] - her cell.  At her home This provider location: [place] 72 y.o. - esbriet still pending. BAck on tyvaso since  mid-late sept 2022. Now doing it 4x/day - 5x/each time.   Also reports sewage leak since April/May 2022 Occassional odor. Some leakage through cleanup pipe. FEels this has made her respiration worse instead of tyvaso. Initialy she thought was tyvaso but later realized might have been due to sweage leak and finally fixed in sept 2022.She feels after long term stability things got worse because of sweage leak. She feels since leak fixed (Same time went back on tyvaso) she is better. In gym mets were down to 2.1 but now upto 2.4 mets (baseline 2.7 mets). NExt visit with mid dec 2022.   She wants to know if progression is perrmanent  Esbriet sill in process. Arriving in 2 days. She wil start at 1 pill tid x 1 week -> 2 pill tid and maintain  She can do visit 11/04/21 - 3.00pm - with [name] - video to assess followup on esbriet    OV 11/11/21 - video APP  72 yo female former smoker followed for ILD and Emphysema  Medical history significant for Crohn's Disease and DCHF   Today's video visit is a 1 month follow up for ILD . She has recently started on Esbriet 2 weeks ago, does notice some symptoms of fatigue , joint aches and fatigue. Started on Esbriet 267 mg 2 tab Twice daily, this week. No v/d. Some nausea . No bloody stools. Some intermittent pain under right ribs on/off. No urinary issues. No syncope.  Remains on Tyvaso Four times a day   Remains on Oxygen 6l/m with activity and  2l/m At bedtime  . None at rest  No change in O2 requirements   OV 12/07/2021  Subjective:  Patient ID: Patty [name], female , DOB: 1951-06-25 , age 16 y.o. , MRN: [id number] , ADDRESS: [place] [address] PCP [name], MD Patient Care Team: [name], MD as PCP - General (Family Medicine) Josue Hector, MD as PCP - Cardiology (Cardiology) Pyrtle, Lajuan Lines, MD as Consulting Physician (Gastroenterology) [facility], Melanie Crazier, MD as Consulting Physician (Endocrinology) Brand Males, MD as Consulting Physician (Pulmonary Disease)  This Provider for this visit: Treatment Team:  Attending Provider: Brand Males, MD    12/07/2021 -   Chief Complaint  Patient presents with   Follow-up    PFT performed 12/12. Pt states she began Esbriet November 2022 and said that she has had different side effects from the med.    HPI Bona Hubbard 72 y.o. -at last visit we started pirfenidone but as soon as she went up to 2 pills 3 times daily she started having significant side effects including drop in urine output therefore she stopped.  Most of the side effects are GI.  Present even at 1 pill 3 times daily.  She says stopped pirfenidone yesterday and ever since then beginning to feel better especially today.  Continues on oxygen at the same level.  Is on inhaled treprostinil nebulizer.  Currently increased it to 6 times at 4 times a day.  Slowly going to work her self up.  She held off on escalating this while she was on pirfenidone start.  We looked at her file-per registry but need clarification from radiology about whether she has traction bronchiectasis.  In addition need to know the current status of her polycythemia.  Last CT scan of the chest was in 2021.  We discussed about the fact that her ILD is progressing.  She is lives alone.  No  siblings in the city parents are deceased.  No children.  She has neighbors.  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  She says she regularly updates him.  We did not discuss CODE STATUS.  Offered home palliative care and she is willing.  She knows that she needs to get medical alert.       OV 02/01/2022  Subjective:  Patient ID: Patty [name], female , DOB: 1951/04/10 , age 15 y.o. , MRN: [id number] , ADDRESS: [place] [address] PCP [name], MD Patient Care Team: [name], MD as PCP - General (Family Medicine) Josue Hector, MD as PCP - Cardiology (Cardiology) Pyrtle, Lajuan Lines, MD as Consulting Physician (Gastroenterology) [facility], Melanie Crazier, MD as Consulting Physician (Endocrinology) Brand Males, MD as Consulting Physician (Pulmonary Disease)  This Provider for this visit: Treatment Team:  Attending Provider: Brand Males, MD    02/01/2022 -   Chief Complaint  Patient presents with   Follow-up    Pt states she is about the same since last visit.    HPI Odelia Graciano 72 y.o. -returns for follow-up.  She tells me the inhaler presently is working really well for her.  She feels that given the Crohn's disease is better.  Her exercise tolerance on the treadmill is better.  Is increased to 2.5 METS.  Her shortness of breath score is also improved.  There is normal sewage exposure in the house.  She feels her lungs are improving.  She had a high-resolution CT scan of the chest.  Dr. Rosario Jacks the radiologist says there is ILA and not ILD.  She [as well as myself] no perplexed by this.  We looked at the CT scans ourselves.  In my view this may be early ILD.  It might be slightly worse in 2018.  Looking at lung function test status greater than 15% total decline in 3 years between 2019 and 2022.  However we do agree that the interstitial findings on the CT scan are mild.  Also she does not feel that she has significant emphysema.  She feels the CT report is over waiting the emphysema.  She says even  though she smoked it was not a true pack per day.  She says the Spiriva did not help her.  Therefore she is a little bit perplexed with the emphysema diagnosis on the CT scan.  She and I agreed that we will discuss in a case conference.  I did submit her name for this.  At this point in time she is not on antifibrotic's and prefers not to be on it.  She is content being on treprostinil.     CT Chest data  CT Chest High Resolution  Result Date: 01/31/2022 CLINICAL DATA:  Interstitial lung disease, former smoker. EXAM: CT CHEST WITHOUT CONTRAST TECHNIQUE: Multidetector CT imaging of the chest was performed following the standard protocol without intravenous contrast. High resolution imaging of the lungs, as well as inspiratory and expiratory imaging, was performed. RADIATION DOSE REDUCTION: This exam was performed according to the departmental dose-optimization program which includes automated exposure control, adjustment of the mA and/or kV according to patient size and/or use of iterative reconstruction technique. COMPARISON:  07/10/2020. FINDINGS: Cardiovascular: Atherosclerotic calcification of the aorta, aortic valve and coronary arteries. Pulmonic trunk is enlarged. Heart size within normal limits. No pericardial effusion. Mediastinum/Nodes: No pathologically enlarged mediastinal or axillary lymph nodes. Hilar regions are difficult  to definitively evaluate without IV contrast but appear grossly unremarkable. Esophagus is grossly unremarkable. Lungs/Pleura: Centrilobular emphysema. Negative for subpleural reticulation, traction bronchiectasis/bronchiolectasis, ground-glass, architectural distortion or honeycombing. Subpleural densities and ground-glass seen on supine imaging largely clear on prone imaging. Mild bibasilar scarring and bronchiectasis. No pleural fluid. Airway is unremarkable. No air trapping. Upper Abdomen: Liver margin is slightly irregular. Visualized portions of the liver, gallbladder,  adrenal glands and right kidney are otherwise unremarkable. 1.4 cm exophytic low-attenuation lesion off the interpolar left kidney is likely a cyst. Visualized portions of the spleen, pancreas, stomach and bowel are grossly. Scattered surgical clips in the abdomen. Upper abdominal lymph nodes are not enlarged by CT size criteria. Musculoskeletal: Degenerative changes in the spine. No worrisome lytic or sclerotic lesions. IMPRESSION: 1. Favor absence of interstitial lung disease based on prone imaging. 2. Bibasilar scarring and mild bronchiectasis. 3. Marginal irregularity the liver is indicative of cirrhosis. 4. Aortic atherosclerosis (ICD10-I70.0). Coronary artery calcification. 5.  Emphysema (ICD10-J43.9). 6. Enlarged pulmonic trunk indicative pulmonary arterial hypertension. Electronically Signed   By: Lorin Picket M.D.   On: 01/31/2022 11:53         OV 08/19/2022  Subjective:  Patient ID: Patty [name], female , DOB: Apr 02, 1951 , age 64 y.o. , MRN: [id number] , ADDRESS: [place] [address] PCP [name], MD Patient Care Team: [name], MD as PCP - General (Family Medicine) Josue Hector, MD as PCP - Cardiology (Cardiology) Pyrtle, Lajuan Lines, MD as Consulting Physician (Gastroenterology) [facility], Melanie Crazier, MD as Consulting Physician (Endocrinology) Brand Males, MD as Consulting Physician (Pulmonary Disease)  This Provider for this visit: Treatment Team:  Attending Provider: Brand Males, MD  Type of visit: Video Circumstance: patietn preferene Identification of patient Reannah Totten with 11/20/1951 and MRN [id number] - 2 person identifier Risks: Risks, benefits, limitations of telephone visit explained. Patient understood and verbalized agreement to proceed Anyone else on call: jsut patient Patient location: her home This provider location: [address]; [place]; [place] [address]. [facility]. [phone]   08/19/2022 -  Followup above via video    HPI Brianne Maina 72 y.o. -overall stable but maybe some more tired than usual. Goes to gym and works out treadmill (!5 min), elliptical 2-3h/week. Walks mostly in stores where she does better. Trying to lose weight- have not lose weight . She has not tried medications for weight loss yet but worried in the settling of Crohns she is worried about side effect. STill on Tyvaso - 9 puff 4 times per day. Did  echo yesterday - gr1 ddx and very small pericardia effusion  per rreport. Had repeat CT April 20223 -> they are saying no ILD again. But she is on tyvaso and helping her dyspnea. She feels tyvaso since 2022 and church controlling sewage overflows in [place] being in remission has helped   RE sewage exposure  -is the nearby church behind her house and there ws intermittent exposure. She feels sewage exposure made her worse. She has an attorney  Using o2 6L [place] with exertion. At rest  Today room air at rest - 96% and HR 77%   CT chest HRCT- Apirl 2023  IMPRESSION: 1. Mild, bland appearing bandlike scarring of the bilateral lung bases, unchanged compared to recent prior examination. No evidence of fibrotic interstitial lung disease. 2. Coronary artery disease. 3. Enlargement of the main pulmonary artery, as can be  seen in pulmonary hypertension. 4. Coarse, nodular contour of the partially imaged liver in the upper abdomen, suggesting cirrhosis.   Aortic Atherosclerosis (ICD10-I70.0).     Electronically Signed   By: Jearld Lesch M.D.   On: 03/31/2022 10:16     OV 09/22/2022  Subjective:  Patient ID: Patty [name], female , DOB: 03/04/1951 , age 77 y.o. , MRN: [id number] , ADDRESS: [address] [place] [address] PCP Willow Ora, MD Patient Care Team: Willow Ora, MD as PCP - General (Family Medicine) Wendall Stade, MD as PCP - Cardiology (Cardiology) Pyrtle, Carie Caddy,  MD as Consulting Physician (Gastroenterology) [facility], Konrad Dolores, MD as Consulting Physician (Endocrinology) Kalman Shan, MD as Consulting Physician (Pulmonary Disease)  This Provider for this visit: Treatment Team:  Attending Provider: Kalman Shan, MD   09/22/2022 -   Chief Complaint  Patient presents with   Follow-up    PFT performed today.  Pt states she has had good and bad days with her breathing especially when the weather changes it becomes affected.     HPI Karlyn Bayes 72 y.o. -returns for follow-up.  Overall she feels she is doing stable but her lung function shows continued decline.  She says since last year there is no further sewage exposure in the church behind her house.  She says the way they repaired it she is worried that there may be future risk of exposure.  It is great life charge on [employer].  The pastor that is Josiah Lobo.  She is upset about the situation.  She is worried that the significant sewage exposure last year has continued to contribute to her lung damage.  There is a concept called hit-and-run hypothesis.  Currently she is not smoking.  But pulmonary function test shows continued decline alpha-1 antitrypsin is normal.  She feels the Tyvaso is helping her.  She is up-to-date with the COVID and flu shot this season.  Discussed RSV vaccine and she will have that.  She continues CPAP for sleep apnea.  Of note previous CT scan has reported emphysema.  She states.  I did not help her in the past but she is willing to try sample Trelegy.    OV 12/29/2022  Subjective:  Patient ID: Patty [name], female , DOB: 1951/08/29 , age 40 y.o. , MRN: [id number] , ADDRESS: [address] [place] [address] PCP Willow Ora, MD Patient Care Team: Willow Ora, MD as PCP - General (Family Medicine) Wendall Stade, MD as PCP - Cardiology (Cardiology) Pyrtle, Carie Caddy, MD as Consulting Physician (Gastroenterology) [facility],  Konrad Dolores, MD as Consulting Physician (Endocrinology) Kalman Shan, MD as Consulting Physician (Pulmonary Disease)  This Provider for this visit: Treatment Team:  Attending Provider: Kalman Shan, MD    12/29/2022 -   Chief Complaint  Patient presents with   Follow-up    Pulled muscles behind right knee area.  Unable to very active during August, Sept, Oct and first part of November 2023.  Oxygen drops with exertion while using  6 lpm pulsed oxygen.  Trelegy did not improve breathing, took both samples and stopped.  HPI Kisha Kriss 72 y.o. -returns for follow-up.  Since her last visit there is no further sewage exposure although the sewage drain itself is not fixed.  She has had difficulty getting an attorney.  She did get pulmonary function test recently and shows continued stability in both FVC and DLCO since September  2022 which was the last time.  Of sewage exposure.  It is definitely progressive sieve since February 2022 and 2021.  Symptoms course recently is stable.  She says she is improved her effort tolerance and is able to do 2.7 METS exercise.  She says recently she overdid her exercise and in the summer 2023 around August she pulled her right hamstring.  After that she was given some muscle relaxant that made her confused and had side effects.  Around the same time she was also on Trelegy and she not sure if some of the side effects were from Trelegy.  She did get tachycardic with Trelegy.  Therefore she does not want to do this anymore.  Last month when she had pulmonary function test albuterol seem to help her so she just wants albuterol for rescue and she did not want any long-acting beta agonist.  Previously we tried Spiriva and she does not want to try this again.  She is up-to-date with her vaccines.  Overall she is feeling stable using 2 L at rest and 6 L with exertion.  Symptom score also shows stability.     OV 04/06/2023  Subjective:  Patient ID: Patty [name], female , DOB: [birth year] , age 1 y.o. , MRN: [id number] , ADDRESS: [address] [place] [address] PCP Willow Ora, MD Patient Care Team: Willow Ora, MD as PCP - General (Family Medicine) Wendall Stade, MD as PCP - Cardiology (Cardiology) Pyrtle, Carie Caddy, MD as Consulting Physician (Gastroenterology) [facility], Konrad Dolores, MD as Consulting Physician (Endocrinology) Kalman Shan, MD as Consulting Physician (Pulmonary Disease)  This Provider for this visit: Treatment Team:  Attending Provider: Kalman Shan, MD    Chronic hypoxemic respiratory failure due to all of the below ILD (interstitial lung disease) ([facility]) - 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 - diagnosed Sept 2022 (in Feb a2023 and April 2023 they are saying no ILD and she feels is because crohns is under remission, tyvaso definitely since sep 2022, and resolution nearbyhome sewage exposure may 2022 - sept 2022) -Non-IPF likely - such as NSIP -  Decline PF March 2020 through February 2022  but stable October 2020 through February 2022  -> declined sept 2022 PFT -Stable on CT scan of the chest 2018 through July 2021  -You are using room air at rest with 3-5 L with exertion -pulse oxygen for the last few months - started esbriet  oct/nov 2022 - stopped GI intolerance 11/29/21    WHO group 3 pulmonary arterial hypertension ([facility]) with elevaed PCWP to 20 on Dec 2021 - started tyvaso July 2022 -> stopped sept 2022 of concern PFT declined could ave been due to tyvaso -> but restarted later in sept 2022   WHO group 3 pulmonary arterial hypertension ([facility]) with elevaed PCWP to 20 on Dec 2021 - -Continue CPAP at ngith with 2L [place] at   Obestuity - Morbid Crohns Polycythemua Former 45 pack smoker  04/06/2023 -   Chief Complaint  Patient presents with   Follow-up    Pressure sores ??      HPI Ryna Beckstrom 72 y.o. -returns for her WHO group 3 pulm hypertension associated with  emphysema [previous CT reading has ruled out ILD].  It was previously determined that she does not have ILD.  She is tolerating her Tyvaso fine.  She is feeling stable.  Walking desaturation test was stable.  She reports easy tachycardia.  She has seen Dr. Eden Emms  for this and she has been reassured.  We discussed doing beta-blockers but she is not interested.  She notes tachycardia when she is on the treadmill with her oxygen on.  The heart rate goes around to 130-135.  Her age-predicted max is around 120/min.  She is not necessarily symptomatic.  I did indicate to her that as long as it is 15 130-135 and she is asymptomatic she can continue to monitor.  Discussed physiology of heart rate response.  She continues a CPAP at night with oxygen.  She continues albuterol as needed.  Her last CT scan of the chest was in April 2023.  She is a former 45 pack smoker  Of note she still has not had any further sewage exposure from the neighbor since 2022.  Her last pulmonary function test December 2023 showed continued stability although it is worse compared to 2021 and early 2022 [preexposure].  She is still considering her legal options.      SYMPTOM SCALE -  03/12/2019  10/07/2019 30# wt gain  06/23/2020 250# 09/07/2021 266# - tyvaso since July 2022 12/07/2021 272# 02/01/2022 tyvaso slightly so much better 09/22/2022  12/29/2022  04/06/2023   O2 use RA ra   2-6L 2-6L 2-6L 2-6L  2-6L   Shortness of Breath 0 -> 5 scale with 5 being worst (score 6 If unable to do)   0 0      At rest 0 0 0 0 with o2 0 0 0 0 2  Simple tasks - showers, clothes change, eating, shaving 0 0  0 0 1 1 1.5 2  Household (dishes, doing bed, laundry) 0 0 0 0 4 1 4  1.5 3.5  Shopping 0 0 0 0 1 0 0 0 0  Walking at own pace 0 4.5 4.5 3 4 1  1.5 0 2  Walking up Stairs 1-2 5 5 3 5 3 6  4.5 5  Total (40 - 48) Dyspnea Score 2 9.5 9.5 6 al with o2 14 6 13.5 7.5 15.4  How bad is your cough? 0 0 0 0 0 At the time of onset of0 0 0 0  How bad is your  fatigue 0 1 0 2 3 1 4  0 2  nausea   0 0 0 0 0 0 0  vomit   0 0 0 0 0 0 0  diarrhe   0 2 3 1  0 0 0  axniety   3 2 2 1 6 2  2.5  depression   0 1 1 0 0 0 0        Simple office walk 185 feet x  3 laps goal with forehead probe 03/12/2019  10/07/2019  06/23/2020  09/07/2021  04/06/2023   O2 used Room air Room air Room air ra ra  Number laps completed 3 2nd lap 1 lap 1 lap 1/2 laps  Comments about pace slow slow slow    Resting Pulse Ox/HR 100% and 85/min 96% and 72/min 97% and 77 97% and 76 90% and HR 93  Final Pulse Ox/HR 88% and 135/min 86% and 105/min 87% and 112/min 83 and 112/min 83% ad HR 111  Desaturated </= 88% yes      Desaturated <= 3% points Yes, 12      Got Tachycardic >/= 90/min yes      Symptoms at end of test Mild dyspnea  Corrected with 5L [place]. Even with 4L [place] downt to  87% Corrected with 6LNC at 94$ for antoher 2lpas  Miscellaneous comments x  ? worse       PFT     Latest Ref Rng & Units 12/23/2022    1:51 PM 09/22/2022    2:27 PM 12/06/2021   12:57 PM 09/07/2021   12:54 PM 02/22/2021   11:52 AM 06/09/2020   11:25 AM 10/01/2019    3:58 PM  PFT Results  FVC-Pre L 2.28  2.16  2.32  2.28  2.38  2.42  2.38   FVC-Predicted Pre % 80  76  80  79  81  82  81   FVC-Post L 2.41         FVC-Predicted Post % 85         Pre FEV1/FVC % % 62  60  60  62  63  63  69   Post FEV1/FCV % % 62         FEV1-Pre L 1.41  1.30  1.38  1.41  1.50  1.53  1.65   FEV1-Predicted Pre % 65  61  63  65  68  68  74   FEV1-Post L 1.50         DLCO uncorrected ml/min/mmHg 12.06  10.92  11.82  11.94  15.05  13.48  14.39   DLCO UNC% % 64  57  62  63  79  70  75   DLCO corrected ml/min/mmHg 11.85  10.61  11.45  11.94  27.44  12.89    DLCO COR %Predicted % 62  56  60  63  144  67    DLVA Predicted % 67  66  66  70  152  70  75   TLC L 5.08         TLC % Predicted % 102         RV % Predicted % 117              has a past medical history of Abnormal nuclear stress test, Anxiety, Aortic  atherosclerosis, Arthritis, Chronic diastolic CHF (congestive heart failure), Coronary artery calcification seen on CT scan, Crohn disease, DVT (deep venous thrombosis), Dyspnea, Grade I diastolic dysfunction, Hyperlipidemia, ILD (interstitial lung disease), OSA on CPAP, Pernicious anemia, Polycythemia vera, Pulmonary embolism (2010), Pulmonary hypertension, and Sleep disorder (01/10/2020).   reports that she quit smoking about 14 years ago. Her smoking use included cigarettes. She has a 45.00 pack-year smoking history. She has never used smokeless tobacco.  Past Surgical History:  Procedure Laterality Date    RIGHT BREAST LUMPECTOMY WITH RADIOACTIVE SEED LOCALIZATION (Right Breast)  02/04/2021   BIOPSY  09/30/2019   Procedure: BIOPSY;  Surgeon: Beverley Fiedler, MD;  Location: [facility];  Service: Gastroenterology;;   BREAST LUMPECTOMY WITH RADIOACTIVE SEED LOCALIZATION Right 02/04/2021   Procedure: RIGHT BREAST LUMPECTOMY WITH RADIOACTIVE SEED LOCALIZATION;  Surgeon: Abigail Miyamoto, MD;  Location: [facility] OR;  Service: General;  Laterality: Right;   CARDIAC CATHETERIZATION     COLON RESECTION  1993   COLONOSCOPY WITH PROPOFOL N/A 09/30/2019   Procedure: COLONOSCOPY WITH PROPOFOL;  Surgeon: Beverley Fiedler, MD;  Location: [facility];  Service: Gastroenterology;  Laterality: N/A;   HIP ARTHROPLASTY Right    x 2, initial right THA, then subsequent right THA revision    LEFT HEART CATH AND CORONARY ANGIOGRAPHY N/A 07/28/2017   Procedure: Left Heart Cath and Coronary Angiography;  Surgeon: Kathleene Hazel, MD;  Location: [facility] INVASIVE CV LAB;  Service: Cardiovascular;  Laterality: N/A;   POLYPECTOMY  09/30/2019   Procedure: POLYPECTOMY;  Surgeon: Beverley Fiedler, MD;  Location: [facility];  Service: Gastroenterology;;   RIGHT HEART CATH N/A 12/07/2020   Procedure: RIGHT HEART CATH;  Surgeon: Tonny Bollman, MD;  Location: [facility] INVASIVE CV LAB;  Service: Cardiovascular;  Laterality: N/A;     Allergies  Allergen Reactions   Tizanidine Hcl Shortness Of Breath   Victoza [Liraglutide] Shortness Of Breath   Metformin And Related Diarrhea    Increased moodiness   Onion Other (See Comments)    sereve diarrhea, stomach pains (Crohn's flares)   Lasix [Furosemide] Itching    Hypersensitivity - pt can take as needed    Pirfenidone Other (See Comments)    GI side effects   Potassium-Containing Compounds Other (See Comments)    Due to chron's, difficult passing though kidneys not allowing absorption in the body    Prednisone Other (See Comments)    Altered mood   Sulfa Drugs Cross Reactors Other (See Comments)    "feels like bugs crawling on me"   Trelegy Ellipta [Fluticasone-Umeclidin-Vilant] Palpitations    Immunization History  Administered Date(s) Administered   Fluad Quad(high Dose 65+) 10/07/2019, 09/09/2020, 11/27/2021, 09/19/2022   Influenza, High Dose Seasonal PF 08/28/2018   Influenza-Unspecified 11/23/2017   PFIZER Comirnaty(Gray Top)Covid-19 Tri-Sucrose Vaccine 09/19/2022   PFIZER(Purple Top)SARS-COV-2 Vaccination 02/16/2020, 03/17/2020, 12/14/2020, 05/14/2021, 10/27/2021   Pneumococcal Conjugate-13 02/13/2018   Pneumococcal Polysaccharide-23 03/12/2019   Respiratory Syncytial Virus Vaccine,Recomb Aduvanted(Arexvy) 09/24/2022    Family History  Problem Relation Age of Onset   Glaucoma Mother    High blood pressure Mother    High Cholesterol Mother    Sleep apnea Mother    Diabetes Mellitus II Father    Sleep apnea Father    Anxiety disorder Father    COPD Brother    Heart disease Brother    Non-Hodgkin's lymphoma Brother    Diabetes Mellitus II Brother    Glaucoma Brother    Colon cancer Neg Hx    Esophageal cancer Neg Hx    Pancreatic cancer Neg Hx    Stomach cancer Neg Hx    Liver disease Neg Hx      Current Outpatient Medications:    amoxicillin (AMOXIL) 500 MG capsule, Take 4 capsules (2,000 mg total) by mouth once as needed for up to 1  dose. Before dental procedures, Disp: 20 capsule, Rfl: 1   aspirin EC 81 MG tablet, Take 81 mg by mouth daily., Disp: , Rfl:    Calcium Carbonate-Vit D-Min (CALCIUM 1200) 1200-1000 MG-UNIT CHEW, Chew 1 tablet by mouth daily., Disp: , Rfl:    CANNABIDIOL PO, Take 1 Dose by mouth 2 (two) times daily as needed (anxiety/sleep.). CBD Oil, Disp: , Rfl:    cetirizine (ZYRTEC) 10 MG tablet, Take 10 mg by mouth daily as needed (sinus/allergies.)., Disp: , Rfl:    cetirizine-pseudoephedrine (ZYRTEC-D) 5-120 MG tablet, Take 1 tablet by mouth daily as needed (sinus headaches.)., Disp: , Rfl:    clonazePAM (KLONOPIN) 0.5 MG tablet, Take 0.5-1 tablets (0.25-0.5 mg total) by mouth daily as needed for anxiety., Disp: 90 tablet, Rfl: 1   cyanocobalamin (VITAMIN B12) 1000 MCG/ML injection, Inject 1 mL (1,000 mcg total) into the muscle every 30 (thirty) days., Disp: 10 mL, Rfl: 1   FLUoxetine (PROZAC) 20 MG capsule, Take 1 capsule (20 mg total) by mouth daily., Disp: 90 capsule, Rfl: 3   fluticasone (FLONASE) 50 MCG/ACT nasal spray, Place 1-2 sprays into both nostrils daily as needed for allergies or  rhinitis., Disp: , Rfl:    Fluticasone-Umeclidin-Vilant (TRELEGY ELLIPTA) 100-62.5-25 MCG/ACT AEPB, Inhale 1 puff into the lungs daily., Disp: 14 each, Rfl: 0   furosemide (LASIX) 20 MG tablet, Take 1 tablet (20 mg total) by mouth as needed for fluid., Disp: 90 tablet, Rfl: 0   Glycerin-Polysorbate 80 (REFRESH DRY EYE THERAPY OP), Place 1 drop into both eyes daily., Disp: , Rfl:    hydrocortisone (PROCTOZONE-HC) 2.5 % rectal cream, Place 1 application rectally 2 (two) times daily as needed (Crohn's flare)., Disp: 28 g, Rfl: 2   ibuprofen (ADVIL) 200 MG tablet, Take 400-800 mg by mouth every 8 (eight) hours as needed (pain.)., Disp: , Rfl:    mesalamine (PENTASA) 250 MG CR capsule, Take 250-500 mg by mouth 4 (four) times daily as needed (crohn's disease flare ups)., Disp: , Rfl:    Multiple Vitamins-Minerals (ICAPS AREDS 2  PO), Take 1 tablet by mouth daily., Disp: , Rfl:    prednisoLONE acetate (PRED FORTE) 1 % ophthalmic suspension, Place 1 drop into both eyes 2 (two) times daily as needed (Uveitis)., Disp: , Rfl:    Probiotic Product (PROBIOTIC PO), Take 1 capsule by mouth 2 (two) times a week. As needed, Disp: , Rfl:    SYRINGE-NEEDLE, DISP, 3 ML (LUER LOCK SAFETY SYRINGES) 25G X 1" 3 ML MISC, USE AS DIRECTED, Disp: 100 each, Rfl: 5   traMADol (ULTRAM) 50 MG tablet, Take 1 tablet (50 mg total) by mouth every 6 (six) hours as needed., Disp: 20 tablet, Rfl: 0   traZODone (DESYREL) 50 MG tablet, TAKE 0.5-1 TABLETS BY MOUTH AT BEDTIME AS NEEDED FOR SLEEP., Disp: 90 tablet, Rfl: 3   Treprostinil (TYVASO REFILL) 0.6 MG/ML SOLN, Inhale 54 mcg into the lungs in the morning, at noon, in the evening, and at bedtime., Disp: 87 mL, Rfl: 11   albuterol (VENTOLIN HFA) 108 (90 Base) MCG/ACT inhaler, Inhale 2 puffs into the lungs every 6 (six) hours as needed for wheezing or shortness of breath., Disp: 8 g, Rfl: 6      Objective:   Vitals:   04/06/23 1534  BP: 124/82  Pulse: 90  Temp: 98.2 F (36.8 C)  TempSrc: Oral  SpO2: 92%  Weight: 263 lb (119.3 kg)  Height: 5\' 4"  (1.626 m)    Estimated body mass index is 45.14 kg/m as calculated from the following:   Height as of this encounter: 5\' 4"  (1.626 m).   Weight as of this encounter: 263 lb (119.3 kg).  @WEIGHTCHANGE @  Filed Weights   04/06/23 1534  Weight: 263 lb (119.3 kg)     Physical Exam  General: No distress. Obese. Looks stble Neuro: Alert and Oriented x 3. GCS 15. Speech normal Psych: Pleasant Resp:  Barrel Chest - no.  Wheeze - no, Crackles - no, No overt respiratory distress CVS: Normal heart sounds. Murmurs - no Ext: Stigmata of Connective Tissue Disease - no HEENT: Normal upper airway. PEERL +. No post nasal drip        Assessment:       ICD-10-CM   1. Chronic respiratory failure with hypoxia  J96.11 CT Chest High Resolution    2.  WHO group 3 pulmonary arterial hypertension  I27.23     3. Pulmonary emphysema with fibrosis of lung  J43.9    J84.10     4. Former very heavy cigarette smoker (more than 40 per day)  Z87.891 CT Chest High Resolution    5. Sinus tachycardia  R00.0  Plan:     Patient Instructions  Chronic hypoxemic respiratory failure  ILD (interstitial lung disease) (HCC)  WHO group 3 pulmonary arterial hypertension (HCC) with elevaed PCWP to 20 on Dec 2021 Former heavy smoker     Since sept 2022 through right now there is no seewage exposure that you know of from church behind your house. Lung function stable r since sept 2022  -> Dec 2023 but progressive since 2021/feb 2022. Currently no flare up.    Plan - continue albuterol MDI 2 puff as needed for emergency - continue tyvaso at curent dose --Continue oxygen 3-6 L nasal cannula pulsed with exertion and at night 2 L nasal cannula and room air at rest -Continue exercise program  -However continue to keep her pulse ox at greater than or equal to 88% using a pulse oximeter --Make attempts to lose weight - talk to your PCP Willow Ora, MD abut weight loss drugs - spirometry and dlco in 6 months - HRCT in 6 months (last April 2023)     OSA on CPAP  Stable  Plan  -Continue CPAP at ngith with 2L [place] at night  per the advice of his sleep doctor   Associated emphysema on CT -feb 2023 Normal Alpha 1 AT  - prior spiriva did not help - trelegy might have caused problems but unclear if this was a muscle relaxant  Plan  -prescribe albluterol 2 puff as needed   Sinus Tachycardia  -This happens with exertion and is likely related to weight, physical deconditioning and also hypoxemia -Age-predicted minute maximal heart rate is around 120/min  Plan -As long as you are feeling asymptomatic okay to let your heart rate go to 130-135/min -Discussed beta-blocker but agreed we will hold off   Follow-up  -6  months s face to  face with Dr Marchelle Gearing; 15 min visit  = symptom score and walk test at followupo   -  ( Level 05 visit: Estb 40-54 min visit type: on-site physical face to visit  in total care time and counseling or/and coordination of care by this undersigned MD - Dr Kalman Shan. This includes one or more of the following on this same day 04/06/2023: pre-charting, chart review, note writing, documentation discussion of test results, diagnostic or treatment recommendations, prognosis, risks and benefits of management options, instructions, education, compliance or risk-factor reduction. It excludes time spent by the CMA or office staff in the care of the patient. Actual time 40 min)   SIGNATURE    Dr. Kalman Shan, M.D., F.C.C.P,  Pulmonary and Critical Care Medicine Staff Physician, [facility] Director - Interstitial Lung Disease  Program  [facility] at [facility] [place], [place], [address]  Pager: [phone], If no answer or between  15:00h - 7:00h: call [phone] Telephone: [phone]  3:44 PM 04/06/2023

## 2023-04-06 NOTE — Patient Instructions (Addendum)
Chronic hypoxemic respiratory failure  ILD (interstitial lung disease) (HCC)  WHO group 3 pulmonary arterial hypertension (HCC) with elevaed PCWP to 20 on Dec 2021 Former heavy smoker     Since sept 2022 through right now there is no seewage exposure that you know of from church behind your house. Lung function stable r since sept 2022  -> Dec 2023 but progressive since 2021/feb 2022. Currently no flare up.    Plan - continue albuterol MDI 2 puff as needed for emergency - continue tyvaso at curent dose --Continue oxygen 3-6 L nasal cannula pulsed with exertion and at night 2 L nasal cannula and room air at rest -Continue exercise program  -However continue to keep her pulse ox at greater than or equal to 88% using a pulse oximeter --Make attempts to lose weight - talk to your PCP Willow Ora, MD abut weight loss drugs - spirometry and dlco in 6 months - HRCT in 6 months (last April 2023)     OSA on CPAP  Stable  Plan  -Continue CPAP at ngith with 2L [place] at night  per the advice of his sleep doctor   Associated emphysema on CT -feb 2023 Normal Alpha 1 AT  - prior spiriva did not help - trelegy might have caused problems but unclear if this was a muscle relaxant  Plan  -prescribe albluterol 2 puff as needed   Sinus Tachycardia  -This happens with exertion and is likely related to weight, physical deconditioning and also hypoxemia -Age-predicted minute maximal heart rate is around 120/min  Plan -As long as you are feeling asymptomatic okay to let your heart rate go to 130-135/min -Discussed beta-blocker but agreed we will hold off   Follow-up  -6  months s face to face with Dr Marchelle Gearing; 15 min visit  = symptom score and walk test at followupo   -

## 2023-04-06 NOTE — Addendum Note (Signed)
Addended byClyda Greener M on: 04/06/2023 04:11 PM   Modules accepted: Orders

## 2023-04-11 ENCOUNTER — Ambulatory Visit: Payer: Medicare Other | Admitting: Physical Therapy

## 2023-04-11 DIAGNOSIS — M79661 Pain in right lower leg: Secondary | ICD-10-CM | POA: Diagnosis not present

## 2023-04-11 DIAGNOSIS — R262 Difficulty in walking, not elsewhere classified: Secondary | ICD-10-CM

## 2023-04-11 DIAGNOSIS — R2689 Other abnormalities of gait and mobility: Secondary | ICD-10-CM | POA: Diagnosis not present

## 2023-04-11 NOTE — Therapy (Signed)
OUTPATIENT PHYSICAL THERAPY LOWER EXTREMITY     Patient Name: Patty [name] MRN: [id number] DOB:1951-03-28, 72 y.o., female Today's Date: 04/11/2023  END OF SESSION:  PT End of Session - 04/11/23 1351     Visit Number 11    Date for PT Re-Evaluation 05/18/23    PT Start Time 1355    PT Stop Time 1440    PT Time Calculation (min) 45 min               Past Medical History:  Diagnosis Date   Abnormal nuclear stress test    a. 07/2017: cath with no CAD   Anxiety    Aortic atherosclerosis    Arthritis    Chronic diastolic CHF (congestive heart failure)    Coronary artery calcification seen on CT scan    Crohn disease    DVT (deep venous thrombosis)    Dyspnea    Grade I diastolic dysfunction    Hyperlipidemia    ILD (interstitial lung disease)    OSA on CPAP    CPAP    AND O2   Pernicious anemia    Polycythemia vera    Pulmonary embolism 2010   s/p hip surgery , reports this was never truly confirmed    Pulmonary hypertension    Sleep disorder 01/10/2020   Uses klonopin for sleep since 2014; sleep walking. And anxiety   Past Surgical History:  Procedure Laterality Date    RIGHT BREAST LUMPECTOMY WITH RADIOACTIVE SEED LOCALIZATION (Right Breast)  02/04/2021   BIOPSY  09/30/2019   Procedure: BIOPSY;  Surgeon: Beverley Fiedler, MD;  Location: [facility];  Service: Gastroenterology;;   BREAST LUMPECTOMY WITH RADIOACTIVE SEED LOCALIZATION Right 02/04/2021   Procedure: RIGHT BREAST LUMPECTOMY WITH RADIOACTIVE SEED LOCALIZATION;  Surgeon: Abigail Miyamoto, MD;  Location: [facility];  Service: General;  Laterality: Right;   CARDIAC CATHETERIZATION     COLON RESECTION  1993   COLONOSCOPY WITH PROPOFOL N/A 09/30/2019   Procedure: COLONOSCOPY WITH PROPOFOL;  Surgeon: Beverley Fiedler, MD;  Location: [facility];  Service: Gastroenterology;  Laterality: N/A;   HIP ARTHROPLASTY Right    x 2, initial right THA, then subsequent right THA revision    LEFT HEART CATH AND CORONARY  ANGIOGRAPHY N/A 07/28/2017   Procedure: Left Heart Cath and Coronary Angiography;  Surgeon: Kathleene Hazel, MD;  Location: [facility] INVASIVE CV LAB;  Service: Cardiovascular;  Laterality: N/A;   POLYPECTOMY  09/30/2019   Procedure: POLYPECTOMY;  Surgeon: Beverley Fiedler, MD;  Location: [facility];  Service: Gastroenterology;;   RIGHT HEART CATH N/A 12/07/2020   Procedure: RIGHT HEART CATH;  Surgeon: Tonny Bollman, MD;  Location: [facility] INVASIVE CV LAB;  Service: Cardiovascular;  Laterality: N/A;   Patient Active Problem List   Diagnosis Date Noted   Pulmonary emphysema with fibrosis of lung 01/31/2022   Sclerosing adenosis of right breast 02/04/2021   Prediabetes 02/01/2021   Pulmonary hypertension, unspecified 12/07/2020   Osteopenia 08/05/2020   Adjustment disorder with anxiety 04/29/2020   Sleep disorder 01/10/2020   Chronic allergic rhinitis 01/10/2020   Pernicious anemia 01/10/2020   Chronic prescription benzodiazepine use 01/10/2020   Benign neoplasm of rectum    Dyslipidemia 09/24/2019   Morbid obesity 09/24/2019   History of DVT (deep vein thrombosis), with PE 07/25/2019   OSA on CPAP    Crohn disease 06/15/2017   ILD (interstitial lung disease) 05/29/2017   Chronic respiratory failure with hypoxia 05/29/2017   History of polycythemia vera 11/12/2014  PCP: Asencion Partridge, MD  REFERRING PROVIDER: Delfin Gant MD  REFERRING DIAG: R lower leg pain  THERAPY DIAG:  Pain in right lower leg  Impairment of balance  Difficulty in walking, not elsewhere classified  Rationale for Evaluation and Treatment: Rehabilitation  ONSET DATE: August 2023  SUBJECTIVE:   SUBJECTIVE STATEMENT:  RT distal lateral pain comes back after several days from PT.  3-4/10 not constant mostly in morning.Overall stronger   PERTINENT HISTORY: Had new shoes, reached forward to the ground, shoes were taller, strained my R calf PAIN:  Are you having pain? Yes: Pain location: R lateral lower  leg Pain description: aches, deep ache Aggravating factors: sit to stand Relieving factors: rests  PRECAUTIONS: Other: using on demand O2, checked her own O2 sats throughout eval  WEIGHT BEARING RESTRICTIONS: No  FALLS:  Has patient fallen in last 6 months? No  LIVING ENVIRONMENT: Lives with: lives alone Lives in: House/apartment Stairs: Yes: Internal: 2 steps; can reach both Has following equipment at home: Single point cane2  OCCUPATION: retired  PLOF: Independent  PATIENT GOALS: to be more I, stop pain R leg, avoid falls   NEXT MD VISIT: 05/25/23  OBJECTIVE:   DIAGNOSTIC FINDINGS: na, patient reports x rays - R ankle  PATIENT SURVEYS:  LEFS 30/80  62.5% disability COGNITION: Overall cognitive status: Within functional limits for tasks assessed     SENSATION: WFL  EDEMA:  None noted  MUSCLE LENGTH: Hamstrings: Right wfl deg; Left wfl deg   POSTURE:  R hip ER in standing, overpronates B ankles,with gait, greater R than L R medial gastrocs atrophied compared to L  PALPATION: Tender , thickened R medial gastrocs muscle belly, non tender post knee tender R fibular head  LOWER EXTREMITY ROM:  Passive ROM Right eval Left eval RT 03/21/23 04/05/23 R  Hip flexion 90     Hip extension      Hip abduction      Hip adduction      Hip internal rotation      Hip external rotation      Knee flexion      Knee extension      Ankle dorsiflexion Ankle plantarflexion wfl wfl    Ankle inversion Wfl wfl    Ankle eversion Wfl wfl     (Blank rows = not tested)  LOWER EXTREMITY MMT:MMT B LE's equal and wnl.  Single leg heel raises 15 reps L, 13 reps R each FUNCTIONAL TESTS:  Single leg stance R and L 3 sec each  GAIT: Distance walked: 56' within department, rested and self monitored her pulse oxygen, dropped to 88 x 2 todaybut rebounded Assistive device utilized:  supplemental on demand O2 Level of assistance: Complete Independence Comments:  na   TODAY'S TREATMENT:  DATE:   04/11/23  In // bars on foam beam tandem fwd and back, side stepping, heel raises, toe raises, marching In // bars on sit fit BIL squats 2 sets 10 BOSU step  10 x BIL fwd and laterally in // bars  Standing on airex Red TBand kicks 3 way 15 x BIL Educ on possibly getting airex for home  and versatility of ex for balance and strength with emphasis on small muscles HS curl 35# 2 sets 15 Knee ext 25 # 3 sets 10 DN RT lateral LE by MAlbrightPT    04/05/23:  HS curl 35# 2 sets 15 Knee ext 25 # 3 sets 10 Standing on airex, toe raises at counter Standing on airex tandem, with vertical head movements and rotation Leg press: 30#, 3 sets 15 reps Black bar, heel raises, cues for upright posture.  Sit to stand from mat table, with 2 # mediball and feet on airex, 5 reps.    04/04/23 STW to BIL calfs and stretching to calfs and ankles DN by Octavio Graves PT STS on airex 10 x without UE On airex heel raises/toe raise 10 x. HHA alt LE marching 20 x HS curl 35# 2 sets 15 Knee ext 25 # 3 sets 10 Black Bar Heel raise /toe raise 20x each   03/23/23 Nustep L 4 5 min LE only 30# resisted gait 5 x 4 ways Black bar standing heel raise/toe raise 2 sets 10 HS curl 35# 2 sets 15 Knee ext 25 # 3 sets 10 6 inch step up 10 leading RT 10 x leading left CGA with cuing STS airex 3 sets 10 Red tband DF 2 sets 10 Seated for toe raises, red mediball between heels 20x STW and stretching to RT ankle and calf  03/21/23 HS curl 35# 2 sets 15 Knee ext 25 # 3 sets 10 Seated for toe raises, red mediball between heels 20x Seated dyna disc various ankle ROM  Black bar standing heel raise/toe raise 2 sets 10 In // bars on foam beams marching 20 x, tandem ( up and back 5 x) and side stepping 5 x Toe raies and heel raises 2 sets 10 , mini squats 2 sets 10 Red  tband DF 2 sets 10 STW and stretching to RT ankle and calf     03/15/23: Seated multiplanar movements with R foot on blue disc  Seated B hamstring curls 35# B long arc quads 25#  Seated for toe raises, red mediball between heels 20x Seated toe raises orange theraband around ankles for isometric abduction 20 x Standing for toe raises orange theraband around ankles 25x Manual: Long sitting for calcaneal traction , A/P glides subtalar jt, stretching into R ankle dorsiflexion R side lying for cross friction massage R medial gastrocnemius  03/13/23:  Manual: Long sitting for calcaneal traction , A/P glides subtalar jt, stretching into R ankle dorsiflexion R side lying for cross friction massage R medial gastrocnemius blue disc  Therapeutic exercise: Instructed in self Mobilization with movement, R foot on step, forward lunges with theraband placed across R ant ankle Standing for B plantarflexor stretch on wooden adjustable wedge Standing for toe raises with fore foot on black bar Standing for multiplanar R ankle AROM in closed chain/ weight bearing position on airex   03/08/23: Manual: R sidelying , cross friction mass and myofascial release R medial plantarflexors  Therapeutic exercise:   On adjustable wedge; in ll bars for B plantarflexor stretch and B toe raises 15x Staggered  stance L foot on airex in ll bars, R foot on ground, with up/down head movements 15x Step for/ back, in ll bars, R foot planted on airex 15x 2 " step for L foot lateral heel taps to isolate eccentric control R plantarflexors , r lateral hip musculature, R VMO 15x Sitting RT foot on sit fit 4 way s 15 x each 15x Standing B toe raises with ball between ankles 15x, ll bars Seated theraband hamstring curls blue band, 30 reps  03/06/23:  Manual: R sidelying , cross friction mass and myofascial release R medial plantarflexors Long sitting for post glides R subtalar jt, with R ankle dorsiflexion stretch Lunges on  step R LE for mobilization with movement R ankle dorsiflexion, subtalar joint, one bout of 10 reps  Therapeutic exercise:   Black bar  standing ankle Df and PF 2 sets 10 Calf and soleus stretch on black bar airex toe raise and heel raises Airex semitandem standing R foot post, head movements up/down, side side 2 " step for L foot lateral heel taps to isolate eccentric control R plantarflexors , r lateral hip musculature, R VMO  Sitting RT foot on sit fit 4 way s 15 x each  02/28/23 Black bar Df and PF 2 sets 10 Calf and soleus stretch on black bar On foam mat marching, toe raise and heel raises Sitting RT foot on sit fit 4 way s 15 x each Red tband ankle 4 way 2 sets 10 Ankle PROM and jt mobs to increase ROM Calf stretching and DSTW- talked about DN for next session Step stretch for calf and soleous   02/23/23 Instructed in therex as described below, also demod use of roller /rolling pin to self massage R medial calf     PATIENT EDUCATION:  Education details: POC Person educated: Patient Education method: Programmer, multimedia, Facilities manager, Verbal cues, and Handouts Education comprehension: verbalized understanding, returned demonstration, and verbal cues required  HOME EXERCISE PROGRAM: Access Code: [email or web address] URL: [email or web address] Date: 02/23/2023 Prepared by: Linton Rump Speaks  Exercises - Seated Calf Stretch with Strap  - 3 x daily - 7 x weekly - 1 sets - 3 reps - 30 hold - Long Sitting Ankle Plantar Flexion with Resistance  - 1 x daily - 7 x weekly - 2 sets - 10 reps  ASSESSMENT:  CLINICAL IMPRESSION    progressed ex for focus on balance on dynamic surfaces and educ on options to do at home if she gets a pad. Pt did well and felt it worked muscles. DN performed again as she felt it was beneficial OBJECTIVE IMPAIRMENTS: cardiopulmonary status limiting activity, decreased activity tolerance, decreased balance, decreased endurance, difficulty walking, decreased ROM,  decreased strength, hypomobility, and pain.   ACTIVITY LIMITATIONS: carrying, lifting, bending, standing, squatting, stairs, transfers, and locomotion level  PARTICIPATION LIMITATIONS: meal prep, laundry, and community activity  PERSONAL FACTORS: Fitness and 1-2 comorbidities: pulmonary fibrosis, h/o R hip and pelvic fracture  are also affecting patient's functional outcome.   REHAB POTENTIAL: Good  CLINICAL DECISION MAKING: Stable/uncomplicated  EVALUATION COMPLEXITY: Low   GOALS: Goals reviewed with patient? Yes  SHORT TERM GOALS: Target date: 03/09/23 I HEP Baseline: intiated today Goal status: IN PROGRESS  03/23/23 MET  LONG TERM GOALS: Target date: 5/23/242.  1.Improve R ankle Rom for dorsiflexion from 6 to 10 degrees or greater Baseline: 6 Goal status: IN PROGRESS  03/23/23 MET 04/05/23: 10 degrees met so far  2.  Improve unilateral balance to 10 sec or greater on each  leg for reduced fall risk Baseline: 3 sec each leg Goal status: IN PROGRESS  03/23/23 04/05/23: goal progressing so far, 10 sec L leg, 6 R    3.  Able to complete unilateral heel raise R LE from 6 reps to 15 ore greater Baseline:  Goal status: IN PROGRESS 3 /28/24 04/05/23: 14 reps R, 15 reps L  PLAN:  PT FREQUENCY: 1-2x/week  PT DURATION: 12 weeks  PLANNED INTERVENTIONS: Therapeutic exercises, Therapeutic activity, Neuromuscular re-education, Balance training, Gait training, Patient/Family education, Self Care, and Joint mobilization  PLAN FOR NEXT SESSION continue to progress with overall LE strrength and balance, dry needling as needed   Lizmarie Witters,ANGIE, PTA 04/11/2023, 1:52 PM [facility] at [facility] [address] [facility] [place], [place], [address] Phone: [phone]   Fax:  [phone]  Patient Details  Name: Serenah Mill MRN: [id number] Date of Birth: 1951/01/21 Referring Provider:  Willow Ora, MD  Encounter Date: 04/11/2023 [facility] at [facility] [address] [place], [place], [address] Phone: [phone]   Fax:  [phone] at [facility] [address] [place], [place], [address] Phone: [phone]   Fax:  [phone]  Patient Details  Name: Ary Rudnick MRN: [id number] Date of Birth: 03/08/1951 Referring Provider:  Willow Ora, MD  Encounter Date: 04/11/2023   Suanne Marker, PTA 04/11/2023, 1:52 PM  [place] [facility] at [facility] [address]. [place], [place], [address] Phone: [phone]   Fax:  [phone]

## 2023-04-13 ENCOUNTER — Ambulatory Visit: Payer: Medicare Other | Admitting: Physical Therapy

## 2023-04-13 DIAGNOSIS — R2689 Other abnormalities of gait and mobility: Secondary | ICD-10-CM | POA: Diagnosis not present

## 2023-04-13 DIAGNOSIS — R262 Difficulty in walking, not elsewhere classified: Secondary | ICD-10-CM | POA: Diagnosis not present

## 2023-04-13 DIAGNOSIS — M79661 Pain in right lower leg: Secondary | ICD-10-CM | POA: Diagnosis not present

## 2023-04-13 NOTE — Therapy (Signed)
OUTPATIENT PHYSICAL THERAPY LOWER EXTREMITY     Patient Name: Patty [name] MRN: [id number] DOB:1951/04/15, 72 y.o., female Today's Date: 04/13/2023  END OF SESSION:  PT End of Session - 04/13/23 1347     Visit Number 12    Date for PT Re-Evaluation 05/18/23    PT Start Time 1350    PT Stop Time 1435    PT Time Calculation (min) 45 min               Past Medical History:  Diagnosis Date   Abnormal nuclear stress test    a. 07/2017: cath with no CAD   Anxiety    Aortic atherosclerosis    Arthritis    Chronic diastolic CHF (congestive heart failure)    Coronary artery calcification seen on CT scan    Crohn disease    DVT (deep venous thrombosis)    Dyspnea    Grade I diastolic dysfunction    Hyperlipidemia    ILD (interstitial lung disease)    OSA on CPAP    CPAP    AND O2   Pernicious anemia    Polycythemia vera    Pulmonary embolism 2010   s/p hip surgery , reports this was never truly confirmed    Pulmonary hypertension    Sleep disorder 01/10/2020   Uses klonopin for sleep since 2014; sleep walking. And anxiety   Past Surgical History:  Procedure Laterality Date    RIGHT BREAST LUMPECTOMY WITH RADIOACTIVE SEED LOCALIZATION (Right Breast)  02/04/2021   BIOPSY  09/30/2019   Procedure: BIOPSY;  Surgeon: Beverley Fiedler, MD;  Location: [facility];  Service: Gastroenterology;;   BREAST LUMPECTOMY WITH RADIOACTIVE SEED LOCALIZATION Right 02/04/2021   Procedure: RIGHT BREAST LUMPECTOMY WITH RADIOACTIVE SEED LOCALIZATION;  Surgeon: Abigail Miyamoto, MD;  Location: [facility];  Service: General;  Laterality: Right;   CARDIAC CATHETERIZATION     COLON RESECTION  1993   COLONOSCOPY WITH PROPOFOL N/A 09/30/2019   Procedure: COLONOSCOPY WITH PROPOFOL;  Surgeon: Beverley Fiedler, MD;  Location: [facility];  Service: Gastroenterology;  Laterality: N/A;   HIP ARTHROPLASTY Right    x 2, initial right THA, then subsequent right THA revision    LEFT HEART CATH AND CORONARY  ANGIOGRAPHY N/A 07/28/2017   Procedure: Left Heart Cath and Coronary Angiography;  Surgeon: Kathleene Hazel, MD;  Location: [facility] INVASIVE CV LAB;  Service: Cardiovascular;  Laterality: N/A;   POLYPECTOMY  09/30/2019   Procedure: POLYPECTOMY;  Surgeon: Beverley Fiedler, MD;  Location: [facility];  Service: Gastroenterology;;   RIGHT HEART CATH N/A 12/07/2020   Procedure: RIGHT HEART CATH;  Surgeon: Tonny Bollman, MD;  Location: [facility] INVASIVE CV LAB;  Service: Cardiovascular;  Laterality: N/A;   Patient Active Problem List   Diagnosis Date Noted   Pulmonary emphysema with fibrosis of lung 01/31/2022   Sclerosing adenosis of right breast 02/04/2021   Prediabetes 02/01/2021   Pulmonary hypertension, unspecified 12/07/2020   Osteopenia 08/05/2020   Adjustment disorder with anxiety 04/29/2020   Sleep disorder 01/10/2020   Chronic allergic rhinitis 01/10/2020   Pernicious anemia 01/10/2020   Chronic prescription benzodiazepine use 01/10/2020   Benign neoplasm of rectum    Dyslipidemia 09/24/2019   Morbid obesity 09/24/2019   History of DVT (deep vein thrombosis), with PE 07/25/2019   OSA on CPAP    Crohn disease 06/15/2017   ILD (interstitial lung disease) 05/29/2017   Chronic respiratory failure with hypoxia 05/29/2017   History of polycythemia vera 11/12/2014  PCP: Asencion Partridge, MD  REFERRING PROVIDER: Delfin Gant MD  REFERRING DIAG: R lower leg pain  THERAPY DIAG:  Pain in right lower leg  Impairment of balance  Difficulty in walking, not elsewhere classified  Rationale for Evaluation and Treatment: Rehabilitation  ONSET DATE: August 2023  SUBJECTIVE:   SUBJECTIVE STATEMENT:  pain is much better and increased ankle ROM. May need it lower PERTINENT HISTORY: Had new shoes, reached forward to the ground, shoes were taller, strained my R calf PAIN:  Are you having pain? Yes: Pain location: R lateral lower leg Pain description: aches, deep ache Aggravating  factors: sit to stand Relieving factors: rests  PRECAUTIONS: Other: using on demand O2, checked her own O2 sats throughout eval  WEIGHT BEARING RESTRICTIONS: No  FALLS:  Has patient fallen in last 6 months? No  LIVING ENVIRONMENT: Lives with: lives alone Lives in: House/apartment Stairs: Yes: Internal: 2 steps; can reach both Has following equipment at home: Single point cane2  OCCUPATION: retired  PLOF: Independent  PATIENT GOALS: to be more I, stop pain R leg, avoid falls   NEXT MD VISIT: 05/25/23  OBJECTIVE:   DIAGNOSTIC FINDINGS: na, patient reports x rays - R ankle  PATIENT SURVEYS:  LEFS 30/80  62.5% disability COGNITION: Overall cognitive status: Within functional limits for tasks assessed     SENSATION: WFL  EDEMA:  None noted  MUSCLE LENGTH: Hamstrings: Right wfl deg; Left wfl deg   POSTURE:  R hip ER in standing, overpronates B ankles,with gait, greater R than L R medial gastrocs atrophied compared to L  PALPATION: Tender , thickened R medial gastrocs muscle belly, non tender post knee tender R fibular head  LOWER EXTREMITY ROM:  Passive ROM Right eval Left eval RT 03/21/23 04/05/23 R 04/13/23 RT  Hip flexion 90      Hip extension       Hip abduction       Hip adduction       Hip internal rotation       Hip external rotation       Knee flexion       Knee extension       Ankle dorsiflexion 6 12 12 10 15   Ankle plantarflexion wfl wfl     Ankle inversion Wfl wfl     Ankle eversion Wfl wfl      (Blank rows = not tested)  LOWER EXTREMITY MMT:MMT B LE's equal and wnl.  Single leg heel raises 15 reps L, 13 reps R each FUNCTIONAL TESTS:  Single leg stance R and L 3 sec each  GAIT: Distance walked: 32' within department, rested and self monitored her pulse oxygen, dropped to 88 x 2 todaybut rebounded Assistive device utilized:  supplemental on demand O2 Level of assistance: Complete Independence Comments: na   TODAY'S TREATMENT:  DATE:    04/13/23 Assessed and documented goals Assessed and documented DF Nustep L 5 Preformed and Issued HEP on airex as pt purchased one -toe raises, heel raises, SLS, marching and Red tband SL kicks 3 way, tandem stance Calf raises on steps 4 inch 15- issued for HEP 4 Inch step up ups 10 x fwd and laterally BIL- issued for HEP HS curl 35# 2 sets 15 Knee ext 25 # 3 sets 10 Resisted gait 4 way 4 x each 30# DN RT lateral LE more distal than last session by MAlbrightPT        04/11/23  In // bars on foam beam tandem fwd and back, side stepping, heel raises, toe raises, marching In // bars on sit fit BIL squats 2 sets 10 BOSU step  10 x BIL fwd and laterally in // bars  Standing on airex Red TBand kicks 3 way 15 x BIL Educ on possibly getting airex for home  and versatility of ex for balance and strength with emphasis on small muscles HS curl 35# 2 sets 15 Knee ext 25 # 3 sets 10 DN RT lateral LE by MAlbrightPT    04/05/23:  HS curl 35# 2 sets 15 Knee ext 25 # 3 sets 10 Standing on airex, toe raises at counter Standing on airex tandem, with vertical head movements and rotation Leg press: 30#, 3 sets 15 reps Black bar, heel raises, cues for upright posture.  Sit to stand from mat table, with 2 # mediball and feet on airex, 5 reps.    04/04/23 STW to BIL calfs and stretching to calfs and ankles DN by Octavio Graves PT STS on airex 10 x without UE On airex heel raises/toe raise 10 x. HHA alt LE marching 20 x HS curl 35# 2 sets 15 Knee ext 25 # 3 sets 10 Black Bar Heel raise /toe raise 20x each   03/23/23 Nustep L 4 5 min LE only 30# resisted gait 5 x 4 ways Black bar standing heel raise/toe raise 2 sets 10 HS curl 35# 2 sets 15 Knee ext 25 # 3 sets 10 6 inch step up 10 leading RT 10 x leading left CGA with cuing STS airex 3 sets 10 Red tband DF 2 sets  10 Seated for toe raises, red mediball between heels 20x STW and stretching to RT ankle and calf  03/21/23 HS curl 35# 2 sets 15 Knee ext 25 # 3 sets 10 Seated for toe raises, red mediball between heels 20x Seated dyna disc various ankle ROM  Black bar standing heel raise/toe raise 2 sets 10 In // bars on foam beams marching 20 x, tandem ( up and back 5 x) and side stepping 5 x Toe raies and heel raises 2 sets 10 , mini squats 2 sets 10 Red tband DF 2 sets 10 STW and stretching to RT ankle and calf     03/15/23: Seated multiplanar movements with R foot on blue disc  Seated B hamstring curls 35# B long arc quads 25#  Seated for toe raises, red mediball between heels 20x Seated toe raises orange theraband around ankles for isometric abduction 20 x Standing for toe raises orange theraband around ankles 25x Manual: Long sitting for calcaneal traction , A/P glides subtalar jt, stretching into R ankle dorsiflexion R side lying for cross friction massage R medial gastrocnemius  03/13/23:  Manual: Long sitting for calcaneal traction , A/P glides subtalar jt, stretching into R ankle dorsiflexion R side  lying for cross friction massage R medial gastrocnemius blue disc  Therapeutic exercise: Instructed in self Mobilization with movement, R foot on step, forward lunges with theraband placed across R ant ankle Standing for B plantarflexor stretch on wooden adjustable wedge Standing for toe raises with fore foot on black bar Standing for multiplanar R ankle AROM in closed chain/ weight bearing position on airex   03/08/23: Manual: R sidelying , cross friction mass and myofascial release R medial plantarflexors  Therapeutic exercise:   On adjustable wedge; in ll bars for B plantarflexor stretch and B toe raises 15x Staggered stance L foot on airex in ll bars, R foot on ground, with up/down head movements 15x Step for/ back, in ll bars, R foot planted on airex 15x 2 " step for L foot  lateral heel taps to isolate eccentric control R plantarflexors , r lateral hip musculature, R VMO 15x Sitting RT foot on sit fit 4 way s 15 x each 15x Standing B toe raises with ball between ankles 15x, ll bars Seated theraband hamstring curls blue band, 30 reps  03/06/23:  Manual: R sidelying , cross friction mass and myofascial release R medial plantarflexors Long sitting for post glides R subtalar jt, with R ankle dorsiflexion stretch Lunges on step R LE for mobilization with movement R ankle dorsiflexion, subtalar joint, one bout of 10 reps  Therapeutic exercise:   Black bar  standing ankle Df and PF 2 sets 10 Calf and soleus stretch on black bar airex toe raise and heel raises Airex semitandem standing R foot post, head movements up/down, side side 2 " step for L foot lateral heel taps to isolate eccentric control R plantarflexors , r lateral hip musculature, R VMO  Sitting RT foot on sit fit 4 way s 15 x each  02/28/23 Black bar Df and PF 2 sets 10 Calf and soleus stretch on black bar On foam mat marching, toe raise and heel raises Sitting RT foot on sit fit 4 way s 15 x each Red tband ankle 4 way 2 sets 10 Ankle PROM and jt mobs to increase ROM Calf stretching and DSTW- talked about DN for next session Step stretch for calf and soleous   02/23/23 Instructed in therex as described below, also demod use of roller /rolling pin to self massage R medial calf     PATIENT EDUCATION:  Education details: POC Person educated: Patient Education method: Programmer, multimedia, Facilities manager, Verbal cues, and Handouts Education comprehension: verbalized understanding, returned demonstration, and verbal cues required  HOME EXERCISE PROGRAM: Access Code: [email or web address] URL: [email or web address] Date: 02/23/2023 Prepared by: Caralee Ates  Exercises - Seated Calf Stretch with Strap  - 3 x daily - 7 x weekly - 1 sets - 3 reps - 30 hold - Long Sitting Ankle Plantar Flexion with  Resistance  - 1 x daily - 7 x weekly - 2 sets - 10 reps  ASSESSMENT:  CLINICAL IMPRESSION     assessed goals and documented. Increased DF and pt feels from DN and requested lower on leg.  Continuing to work on over all func and strength. Issued HEP   OBJECTIVE IMPAIRMENTS: cardiopulmonary status limiting activity, decreased activity tolerance, decreased balance, decreased endurance, difficulty walking, decreased ROM, decreased strength, hypomobility, and pain.   ACTIVITY LIMITATIONS: carrying, lifting, bending, standing, squatting, stairs, transfers, and locomotion level  PARTICIPATION LIMITATIONS: meal prep, laundry, and community activity  PERSONAL FACTORS: Fitness and 1-2 comorbidities: pulmonary fibrosis, h/o R hip and pelvic fracture  are also  affecting patient's functional outcome.   REHAB POTENTIAL: Good  CLINICAL DECISION MAKING: Stable/uncomplicated  EVALUATION COMPLEXITY: Low   GOALS: Goals reviewed with patient? Yes  SHORT TERM GOALS: Target date: 03/09/23 I HEP Baseline: intiated today Goal status: IN PROGRESS  03/23/23 MET  LONG TERM GOALS: Target date: 5/23/242.  1.Improve R ankle Rom for dorsiflexion from 6 to 10 degrees or greater Baseline: 6 Goal status: IN PROGRESS  03/23/23 MET 04/05/23: 10 degrees met so far 04/13/23 MET  2.  Improve unilateral balance to 10 sec or greater on each leg for reduced fall risk Baseline: 3 sec each leg Goal status: IN PROGRESS  03/23/23 04/05/23: goal progressing so far, 10 sec L leg, 6 R   04/13/23 Left 15 sec. RT 6sec  3.  Able to complete unilateral heel raise R LE from 6 reps to 15 ore greater Baseline:  Goal status: IN PROGRESS 3 /28/24 04/05/23: 14 reps R, 15 reps L   04/13/23  RT 15 x with pain progressing  PLAN:  PT FREQUENCY: 1-2x/week  PT DURATION: 12 weeks  PLANNED INTERVENTIONS: Therapeutic exercises, Therapeutic activity, Neuromuscular re-education, Balance training, Gait training, Patient/Family education, Self  Care, and Joint mobilization  PLAN FOR NEXT SESSION continue to progress with overall LE strrength and balance, dry needling as needed   Tyreece Gelles,ANGIE, PTA 04/13/2023, 1:48 PM [facility] at [facility] [address] [place], [place], [address] Phone: [phone]   Fax:  [phone]  Patient Details  Name: Patty [name] MRN: [id number] Date of Birth: 1951-01-01 Referring Provider:  Willow Ora, MD  Encounter Date: 04/13/2023 [facility] at [facility] [address] [place], [place], [address] Phone: [phone]   Fax:  [phone] at [facility] [address] [place], [place], [address] Phone: [phone]   Fax:  [phone]

## 2023-04-18 ENCOUNTER — Encounter: Payer: Self-pay | Admitting: Physical Therapy

## 2023-04-18 ENCOUNTER — Ambulatory Visit: Payer: Medicare Other | Admitting: Physical Therapy

## 2023-04-18 DIAGNOSIS — R2689 Other abnormalities of gait and mobility: Secondary | ICD-10-CM | POA: Diagnosis not present

## 2023-04-18 DIAGNOSIS — M79661 Pain in right lower leg: Secondary | ICD-10-CM | POA: Diagnosis not present

## 2023-04-18 DIAGNOSIS — R262 Difficulty in walking, not elsewhere classified: Secondary | ICD-10-CM

## 2023-04-18 NOTE — Therapy (Signed)
OUTPATIENT PHYSICAL THERAPY LOWER EXTREMITY     Patient Name: Patty [name] MRN: [id number] DOB:1951-12-24, 72 y.o., female Today's Date: 04/18/2023  END OF SESSION:  PT End of Session - 04/18/23 1315     Visit Number 13    Date for PT Re-Evaluation 05/18/23    PT Start Time 1315    PT Stop Time 1355    PT Time Calculation (min) 40 min    Activity Tolerance Patient tolerated treatment well    Behavior During Therapy [facility] for tasks assessed/performed               Past Medical History:  Diagnosis Date   Abnormal nuclear stress test    a. 07/2017: cath with no CAD   Anxiety    Aortic atherosclerosis    Arthritis    Chronic diastolic CHF (congestive heart failure)    Coronary artery calcification seen on CT scan    Crohn disease    DVT (deep venous thrombosis)    Dyspnea    Grade I diastolic dysfunction    Hyperlipidemia    ILD (interstitial lung disease)    OSA on CPAP    CPAP    AND O2   Pernicious anemia    Polycythemia vera    Pulmonary embolism 2010   s/p hip surgery , reports this was never truly confirmed    Pulmonary hypertension    Sleep disorder 01/10/2020   Uses klonopin for sleep since 2014; sleep walking. And anxiety   Past Surgical History:  Procedure Laterality Date    RIGHT BREAST LUMPECTOMY WITH RADIOACTIVE SEED LOCALIZATION (Right Breast)  02/04/2021   BIOPSY  09/30/2019   Procedure: BIOPSY;  Surgeon: Beverley Fiedler, MD;  Location: [facility];  Service: Gastroenterology;;   BREAST LUMPECTOMY WITH RADIOACTIVE SEED LOCALIZATION Right 02/04/2021   Procedure: RIGHT BREAST LUMPECTOMY WITH RADIOACTIVE SEED LOCALIZATION;  Surgeon: Abigail Miyamoto, MD;  Location: [facility];  Service: General;  Laterality: Right;   CARDIAC CATHETERIZATION     COLON RESECTION  1993   COLONOSCOPY WITH PROPOFOL N/A 09/30/2019   Procedure: COLONOSCOPY WITH PROPOFOL;  Surgeon: Beverley Fiedler, MD;  Location: [facility];  Service: Gastroenterology;  Laterality: N/A;   HIP  ARTHROPLASTY Right    x 2, initial right THA, then subsequent right THA revision    LEFT HEART CATH AND CORONARY ANGIOGRAPHY N/A 07/28/2017   Procedure: Left Heart Cath and Coronary Angiography;  Surgeon: Kathleene Hazel, MD;  Location: [facility] INVASIVE CV LAB;  Service: Cardiovascular;  Laterality: N/A;   POLYPECTOMY  09/30/2019   Procedure: POLYPECTOMY;  Surgeon: Beverley Fiedler, MD;  Location: [facility];  Service: Gastroenterology;;   RIGHT HEART CATH N/A 12/07/2020   Procedure: RIGHT HEART CATH;  Surgeon: Tonny Bollman, MD;  Location: [facility] INVASIVE CV LAB;  Service: Cardiovascular;  Laterality: N/A;   Patient Active Problem List   Diagnosis Date Noted   Pulmonary emphysema with fibrosis of lung 01/31/2022   Sclerosing adenosis of right breast 02/04/2021   Prediabetes 02/01/2021   Pulmonary hypertension, unspecified 12/07/2020   Osteopenia 08/05/2020   Adjustment disorder with anxiety 04/29/2020   Sleep disorder 01/10/2020   Chronic allergic rhinitis 01/10/2020   Pernicious anemia 01/10/2020   Chronic prescription benzodiazepine use 01/10/2020   Benign neoplasm of rectum    Dyslipidemia 09/24/2019   Morbid obesity 09/24/2019   History of DVT (deep vein thrombosis), with PE 07/25/2019   OSA on CPAP    Crohn disease 06/15/2017   ILD (  interstitial lung disease) 05/29/2017   Chronic respiratory failure with hypoxia 05/29/2017   History of polycythemia vera 11/12/2014    PCP: Asencion Partridge, MD  REFERRING PROVIDER: Delfin Gant MD  REFERRING DIAG: R lower leg pain  THERAPY DIAG:  Pain in right lower leg  Impairment of balance  Difficulty in walking, not elsewhere classified  Rationale for Evaluation and Treatment: Rehabilitation  ONSET DATE: August 2023  SUBJECTIVE:   SUBJECTIVE STATEMENT:  Patient reports no changes. Her calf was more sore after DN, wants to take a break from that PERTINENT HISTORY: Had new shoes, reached forward to the ground, shoes were taller,  strained my R calf PAIN:  Are you having pain? Yes: Pain location: R lateral lower leg Pain description: aches, deep ache Aggravating factors: sit to stand Relieving factors: rests  PRECAUTIONS: Other: using on demand O2, checked her own O2 sats throughout eval  WEIGHT BEARING RESTRICTIONS: No  FALLS:  Has patient fallen in last 6 months? No  LIVING ENVIRONMENT: Lives with: lives alone Lives in: House/apartment Stairs: Yes: Internal: 2 steps; can reach both Has following equipment at home: Single point cane2  OCCUPATION: retired  PLOF: Independent  PATIENT GOALS: to be more I, stop pain R leg, avoid falls   NEXT MD VISIT: 05/25/23  OBJECTIVE:   DIAGNOSTIC FINDINGS: na, patient reports x rays - R ankle  PATIENT SURVEYS:  LEFS 30/80  62.5% disability COGNITION: Overall cognitive status: Within functional limits for tasks assessed     SENSATION: WFL  EDEMA:  None noted  MUSCLE LENGTH: Hamstrings: Right wfl deg; Left wfl deg   POSTURE:  R hip ER in standing, overpronates B ankles,with gait, greater R than L R medial gastrocs atrophied compared to L  PALPATION: Tender , thickened R medial gastrocs muscle belly, non tender post knee tender R fibular head  LOWER EXTREMITY ROM:  Passive ROM Right eval Left eval RT 03/21/23 04/05/23 R 04/13/23 RT  Hip flexion 90      Hip extension       Hip abduction       Hip adduction       Hip internal rotation       Hip external rotation       Knee flexion       Knee extension       Ankle dorsiflexion 6 12 12 10 15   Ankle plantarflexion wfl wfl     Ankle inversion Wfl wfl     Ankle eversion Wfl wfl      (Blank rows = not tested)  LOWER EXTREMITY MMT:MMT B LE's equal and wnl.  Single leg heel raises 15 reps L, 13 reps R each FUNCTIONAL TESTS:  Single leg stance R and L 3 sec each  GAIT: Distance walked: 71' within department, rested and self monitored her pulse oxygen, dropped to 88 x 2 todaybut rebounded Assistive  device utilized:  supplemental on demand O2 Level of assistance: Complete Independence Comments: na   TODAY'S TREATMENT:  DATE:   04/18/23 Pedalled bike x 4 min, then 3 x 15 sec fast pedals for NM re-ed and coordination, ST assessment- R lower leg TTP ITB insertion, Med/prox calf Side stepping on black floor mat in each direction, then alternately tapping orange cones while stanidng on black floor mat with side stepping. Seated ankle strengthening- ball squeeze 10 x 5 sec holds, B ankle eversion against Red Tband resistance, DF against R tband, 2 x 10 each. Stand on step, calf stretch, both gastroc, then soleus Standing hip abd against Red Tband at knees. BUE support on counter, 1 x 10 then 1 x 5 each, VC for posture and technique.  04/13/23 Assessed and documented goals Assessed and documented DF Nustep L 5 Preformed and Issued HEP on airex as pt purchased one -toe raises, heel raises, SLS, marching and Red tband SL kicks 3 way, tandem stance Calf raises on steps 4 inch 15- issued for HEP 4 Inch step up ups 10 x fwd and laterally BIL- issued for HEP HS curl 35# 2 sets 15 Knee ext 25 # 3 sets 10 Resisted gait 4 way 4 x each 30# DN RT lateral LE more distal than last session by MAlbrightPT  04/11/23  In // bars on foam beam tandem fwd and back, side stepping, heel raises, toe raises, marching In // bars on sit fit BIL squats 2 sets 10 BOSU step  10 x BIL fwd and laterally in // bars  Standing on airex Red TBand kicks 3 way 15 x BIL Educ on possibly getting airex for home  and versatility of ex for balance and strength with emphasis on small muscles HS curl 35# 2 sets 15 Knee ext 25 # 3 sets 10 DN RT lateral LE by MAlbrightPT  04/05/23:  HS curl 35# 2 sets 15 Knee ext 25 # 3 sets 10 Standing on airex, toe raises at counter Standing on airex tandem, with  vertical head movements and rotation Leg press: 30#, 3 sets 15 reps Black bar, heel raises, cues for upright posture.  Sit to stand from mat table, with 2 # mediball and feet on airex, 5 reps.  04/04/23 STW to BIL calfs and stretching to calfs and ankles DN by Octavio Graves PT STS on airex 10 x without UE On airex heel raises/toe raise 10 x. HHA alt LE marching 20 x HS curl 35# 2 sets 15 Knee ext 25 # 3 sets 10 Black Bar Heel raise /toe raise 20x each  03/23/23 Nustep L 4 5 min LE only 30# resisted gait 5 x 4 ways Black bar standing heel raise/toe raise 2 sets 10 HS curl 35# 2 sets 15 Knee ext 25 # 3 sets 10 6 inch step up 10 leading RT 10 x leading left CGA with cuing STS airex 3 sets 10 Red tband DF 2 sets 10 Seated for toe raises, red mediball between heels 20x STW and stretching to RT ankle and calf  PATIENT EDUCATION:  Education details: POC Person educated: Patient Education method: Programmer, multimedia, Facilities manager, Verbal cues, and Handouts Education comprehension: verbalized understanding, returned demonstration, and verbal cues required  HOME EXERCISE PROGRAM: Access Code: [email or web address] URL: [email or web address] Date: 02/23/2023 Prepared by: Caralee Ates  Exercises - Seated Calf Stretch with Strap  - 3 x daily - 7 x weekly - 1 sets - 3 reps - 30 hold - Long Sitting Ankle Plantar Flexion with Resistance  - 1 x daily - 7 x weekly - 2 sets - 10 reps  ASSESSMENT:  CLINICAL IMPRESSION Patient reports continued pain in her R calf. She was more sore and for longer after last session of DN, deferred treatment for today. PT focused on balance on unlevel surfaces, strength in ankles and hips, speed of movement to improve coordinated control. She maintained SATS throughout treatment.   OBJECTIVE IMPAIRMENTS: cardiopulmonary status limiting activity, decreased activity tolerance, decreased balance, decreased endurance, difficulty walking, decreased ROM, decreased strength,  hypomobility, and pain.   ACTIVITY LIMITATIONS: carrying, lifting, bending, standing, squatting, stairs, transfers, and locomotion level  PARTICIPATION LIMITATIONS: meal prep, laundry, and community activity  PERSONAL FACTORS: Fitness and 1-2 comorbidities: pulmonary fibrosis, h/o R hip and pelvic fracture  are also affecting patient's functional outcome.   REHAB POTENTIAL: Good  CLINICAL DECISION MAKING: Stable/uncomplicated  EVALUATION COMPLEXITY: Low   GOALS: Goals reviewed with patient? Yes  SHORT TERM GOALS: Target date: 03/09/23 I HEP Baseline: intiated today Goal status: IN PROGRESS  03/23/23 MET  LONG TERM GOALS: Target date: 5/23/242.  1.Improve R ankle Rom for dorsiflexion from 6 to 10 degrees or greater Baseline: 6 Goal status: IN PROGRESS  03/23/23 MET 04/05/23: 10 degrees met so far 04/13/23 MET  2.  Improve unilateral balance to 10 sec or greater on each leg for reduced fall risk Baseline: 3 sec each leg Goal status: IN PROGRESS  03/23/23 04/05/23: goal progressing so far, 10 sec L leg, 6 R   04/13/23 Left 15 sec. RT 6sec, 04/18/23-SLS addressed in treatment, not re-assessed.  3.  Able to complete unilateral heel raise R LE from 6 reps to 15 ore greater Baseline:  Goal status: IN PROGRESS 3 /28/24 04/05/23: 14 reps R, 15 reps L   04/13/23  RT 15 x with pain progressing  PLAN:  PT FREQUENCY: 1-2x/week  PT DURATION: 12 weeks  PLANNED INTERVENTIONS: Therapeutic exercises, Therapeutic activity, Neuromuscular re-education, Balance training, Gait training, Patient/Family education, Self Care, and Joint mobilization  PLAN FOR NEXT SESSION continue to progress with overall LE strrength and balance, dry needling as needed  Shimika Ames DPT 04/18/23 2:05 PM  [facility] [address] [place], [place], [address] Phone: [phone]   Fax:  [phone]  Patient Details  Name: Patty [name] MRN: [id number] Date of Birth:  [birth year] Referring Provider:  Willow Ora, MD

## 2023-04-20 ENCOUNTER — Ambulatory Visit: Payer: Medicare Other | Admitting: Physical Therapy

## 2023-04-20 DIAGNOSIS — M79661 Pain in right lower leg: Secondary | ICD-10-CM

## 2023-04-20 DIAGNOSIS — R262 Difficulty in walking, not elsewhere classified: Secondary | ICD-10-CM | POA: Diagnosis not present

## 2023-04-20 DIAGNOSIS — R2689 Other abnormalities of gait and mobility: Secondary | ICD-10-CM | POA: Diagnosis not present

## 2023-04-20 NOTE — Therapy (Signed)
OUTPATIENT PHYSICAL THERAPY LOWER EXTREMITY     Patient Name: Patty [name] MRN: [id number] DOB:1951-02-15, 72 y.o., female Today's Date: 04/20/2023  END OF SESSION:  PT End of Session - 04/20/23 1351     Visit Number 14    Date for PT Re-Evaluation 05/18/23    PT Start Time 1400    PT Stop Time 1445    PT Time Calculation (min) 45 min               Past Medical History:  Diagnosis Date   Abnormal nuclear stress test    a. 07/2017: cath with no CAD   Anxiety    Aortic atherosclerosis    Arthritis    Chronic diastolic CHF (congestive heart failure)    Coronary artery calcification seen on CT scan    Crohn disease    DVT (deep venous thrombosis)    Dyspnea    Grade I diastolic dysfunction    Hyperlipidemia    ILD (interstitial lung disease)    OSA on CPAP    CPAP    AND O2   Pernicious anemia    Polycythemia vera    Pulmonary embolism 2010   s/p hip surgery , reports this was never truly confirmed    Pulmonary hypertension    Sleep disorder 01/10/2020   Uses klonopin for sleep since 2014; sleep walking. And anxiety   Past Surgical History:  Procedure Laterality Date    RIGHT BREAST LUMPECTOMY WITH RADIOACTIVE SEED LOCALIZATION (Right Breast)  02/04/2021   BIOPSY  09/30/2019   Procedure: BIOPSY;  Surgeon: Beverley Fiedler, MD;  Location: [facility];  Service: Gastroenterology;;   BREAST LUMPECTOMY WITH RADIOACTIVE SEED LOCALIZATION Right 02/04/2021   Procedure: RIGHT BREAST LUMPECTOMY WITH RADIOACTIVE SEED LOCALIZATION;  Surgeon: Abigail Miyamoto, MD;  Location: [facility];  Service: General;  Laterality: Right;   CARDIAC CATHETERIZATION     COLON RESECTION  1993   COLONOSCOPY WITH PROPOFOL N/A 09/30/2019   Procedure: COLONOSCOPY WITH PROPOFOL;  Surgeon: Beverley Fiedler, MD;  Location: [facility];  Service: Gastroenterology;  Laterality: N/A;   HIP ARTHROPLASTY Right    x 2, initial right THA, then subsequent right THA revision    LEFT HEART CATH AND CORONARY  ANGIOGRAPHY N/A 07/28/2017   Procedure: Left Heart Cath and Coronary Angiography;  Surgeon: Kathleene Hazel, MD;  Location: [facility] INVASIVE CV LAB;  Service: Cardiovascular;  Laterality: N/A;   POLYPECTOMY  09/30/2019   Procedure: POLYPECTOMY;  Surgeon: Beverley Fiedler, MD;  Location: [facility];  Service: Gastroenterology;;   RIGHT HEART CATH N/A 12/07/2020   Procedure: RIGHT HEART CATH;  Surgeon: Tonny Bollman, MD;  Location: [facility] INVASIVE CV LAB;  Service: Cardiovascular;  Laterality: N/A;   Patient Active Problem List   Diagnosis Date Noted   Pulmonary emphysema with fibrosis of lung 01/31/2022   Sclerosing adenosis of right breast 02/04/2021   Prediabetes 02/01/2021   Pulmonary hypertension, unspecified 12/07/2020   Osteopenia 08/05/2020   Adjustment disorder with anxiety 04/29/2020   Sleep disorder 01/10/2020   Chronic allergic rhinitis 01/10/2020   Pernicious anemia 01/10/2020   Chronic prescription benzodiazepine use 01/10/2020   Benign neoplasm of rectum    Dyslipidemia 09/24/2019   Morbid obesity 09/24/2019   History of DVT (deep vein thrombosis), with PE 07/25/2019   OSA on CPAP    Crohn disease 06/15/2017   ILD (interstitial lung disease) 05/29/2017   Chronic respiratory failure with hypoxia 05/29/2017   History of polycythemia vera 11/12/2014  PCP: Asencion Partridge, MD  REFERRING PROVIDER: Delfin Gant MD  REFERRING DIAG: R lower leg pain  THERAPY DIAG:  Pain in right lower leg  Impairment of balance  Difficulty in walking, not elsewhere classified  Rationale for Evaluation and Treatment: Rehabilitation  ONSET DATE: August 2023  SUBJECTIVE:   SUBJECTIVE STATEMENT:  Sore a bit after last session, last session she found some sore spots, didn't realize they were there- only feel I push on it- points to HS    PERTINENT HISTORY: Had new shoes, reached forward to the ground, shoes were taller, strained my R calf PAIN:  Are you having pain? Yes: Pain  location: R lateral lower leg Pain description: aches, deep ache Aggravating factors: sit to stand Relieving factors: rests  PRECAUTIONS: Other: using on demand O2, checked her own O2 sats throughout eval  WEIGHT BEARING RESTRICTIONS: No  FALLS:  Has patient fallen in last 6 months? No  LIVING ENVIRONMENT: Lives with: lives alone Lives in: House/apartment Stairs: Yes: Internal: 2 steps; can reach both Has following equipment at home: Single point cane2  OCCUPATION: retired  PLOF: Independent  PATIENT GOALS: to be more I, stop pain R leg, avoid falls   NEXT MD VISIT: 05/25/23  OBJECTIVE:   DIAGNOSTIC FINDINGS: na, patient reports x rays - R ankle  PATIENT SURVEYS:  LEFS 30/80  62.5% disability COGNITION: Overall cognitive status: Within functional limits for tasks assessed     SENSATION: WFL  EDEMA:  None noted  MUSCLE LENGTH: Hamstrings: Right wfl deg; Left wfl deg   POSTURE:  R hip ER in standing, overpronates B ankles,with gait, greater R than L R medial gastrocs atrophied compared to L  PALPATION: Tender , thickened R medial gastrocs muscle belly, non tender post knee tender R fibular head  LOWER EXTREMITY ROM:  Passive ROM Right eval Left eval RT 03/21/23 04/05/23 R 04/13/23 RT  Hip flexion 90      Hip extension       Hip abduction       Hip adduction       Hip internal rotation       Hip external rotation       Knee flexion       Knee extension       Ankle dorsiflexion 6 12 12 10 15   Ankle plantarflexion wfl wfl     Ankle inversion Wfl wfl     Ankle eversion Wfl wfl      (Blank rows = not tested)  LOWER EXTREMITY MMT:MMT B LE's equal and wnl.  Single leg heel raises 15 reps L, 13 reps R each FUNCTIONAL TESTS:  Single leg stance R and L 3 sec each  GAIT: Distance walked: 34' within department, rested and self monitored her pulse oxygen, dropped to 88 x 2 todaybut rebounded Assistive device utilized:  supplemental on demand O2 Level of  assistance: Complete Independence Comments: na   TODAY'S TREATMENT:  DATE:   04/20/23 Nustep L 5 8 min HS curl 35# 2 sets 15 Knee ext 25 # 3 sets 10 In // bars 3# ankle wts on airex marching, SL hip flex,ext and abd 20 x In //bars on airex 6 inch alt step tap 20 x 2 sets In // bars on airex stepping on and off 6 inch fwd and laterally 10 x each leg In // bars dyna disc BIL ankle ROM 4 way 15 x each    04/18/23 Pedalled bike x 4 min, then 3 x 15 sec fast pedals for NM re-ed and coordination, ST assessment- R lower leg TTP ITB insertion, Med/prox calf Side stepping on black floor mat in each direction, then alternately tapping orange cones while stanidng on black floor mat with side stepping. Seated ankle strengthening- ball squeeze 10 x 5 sec holds, B ankle eversion against Red Tband resistance, DF against R tband, 2 x 10 each. Stand on step, calf stretch, both gastroc, then soleus Standing hip abd against Red Tband at knees. BUE support on counter, 1 x 10 then 1 x 5 each, VC for posture and technique.  04/13/23 Assessed and documented goals Assessed and documented DF Nustep L 5 Preformed and Issued HEP on airex as pt purchased one -toe raises, heel raises, SLS, marching and Red tband SL kicks 3 way, tandem stance Calf raises on steps 4 inch 15- issued for HEP 4 Inch step up ups 10 x fwd and laterally BIL- issued for HEP HS curl 35# 2 sets 15 Knee ext 25 # 3 sets 10 Resisted gait 4 way 4 x each 30# DN RT lateral LE more distal than last session by MAlbrightPT  04/11/23  In // bars on foam beam tandem fwd and back, side stepping, heel raises, toe raises, marching In // bars on sit fit BIL squats 2 sets 10 BOSU step  10 x BIL fwd and laterally in // bars  Standing on airex Red TBand kicks 3 way 15 x BIL Educ on possibly getting airex for home  and  versatility of ex for balance and strength with emphasis on small muscles HS curl 35# 2 sets 15 Knee ext 25 # 3 sets 10 DN RT lateral LE by MAlbrightPT  04/05/23:  HS curl 35# 2 sets 15 Knee ext 25 # 3 sets 10 Standing on airex, toe raises at counter Standing on airex tandem, with vertical head movements and rotation Leg press: 30#, 3 sets 15 reps Black bar, heel raises, cues for upright posture.  Sit to stand from mat table, with 2 # mediball and feet on airex, 5 reps.  04/04/23 STW to BIL calfs and stretching to calfs and ankles DN by Octavio Graves PT STS on airex 10 x without UE On airex heel raises/toe raise 10 x. HHA alt LE marching 20 x HS curl 35# 2 sets 15 Knee ext 25 # 3 sets 10 Black Bar Heel raise /toe raise 20x each  03/23/23 Nustep L 4 5 min LE only 30# resisted gait 5 x 4 ways Black bar standing heel raise/toe raise 2 sets 10 HS curl 35# 2 sets 15 Knee ext 25 # 3 sets 10 6 inch step up 10 leading RT 10 x leading left CGA with cuing STS airex 3 sets 10 Red tband DF 2 sets 10 Seated for toe raises, red mediball between heels 20x STW and stretching to RT ankle and calf  PATIENT EDUCATION:  Education details: POC Person educated: Patient Education  method: Explanation, Demonstration, Verbal cues, and Handouts Education comprehension: verbalized understanding, returned demonstration, and verbal cues required  HOME EXERCISE PROGRAM: Access Code: [email or web address] URL: [email or web address] Date: 02/23/2023 Prepared by: Linton Rump Speaks  Exercises - Seated Calf Stretch with Strap  - 3 x daily - 7 x weekly - 1 sets - 3 reps - 30 hold - Long Sitting Ankle Plantar Flexion with Resistance  - 1 x daily - 7 x weekly - 2 sets - 10 reps  ASSESSMENT:  CLINICAL IMPRESSION Patient reports continued pain in her R calf and now tenderness in RT HS only with palpation. Progressed strength and stamina, also worked on balance. Progressing with LTG 2 and 3. OBJECTIVE IMPAIRMENTS:  cardiopulmonary status limiting activity, decreased activity tolerance, decreased balance, decreased endurance, difficulty walking, decreased ROM, decreased strength, hypomobility, and pain.   ACTIVITY LIMITATIONS: carrying, lifting, bending, standing, squatting, stairs, transfers, and locomotion level  PARTICIPATION LIMITATIONS: meal prep, laundry, and community activity  PERSONAL FACTORS: Fitness and 1-2 comorbidities: pulmonary fibrosis, h/o R hip and pelvic fracture  are also affecting patient's functional outcome.   REHAB POTENTIAL: Good  CLINICAL DECISION MAKING: Stable/uncomplicated  EVALUATION COMPLEXITY: Low   GOALS: Goals reviewed with patient? Yes  SHORT TERM GOALS: Target date: 03/09/23 I HEP Baseline: intiated today Goal status: IN PROGRESS  03/23/23 MET  LONG TERM GOALS: Target date: 5/23/242.  1.Improve R ankle Rom for dorsiflexion from 6 to 10 degrees or greater Baseline: 6 Goal status: IN PROGRESS  03/23/23 MET 04/05/23: 10 degrees met so far 04/13/23 MET  2.  Improve unilateral balance to 10 sec or greater on each leg for reduced fall risk Baseline: 3 sec each leg Goal status: IN PROGRESS  03/23/23 04/05/23: goal progressing so far, 10 sec L leg, 6 R   04/13/23 Left 15 sec. RT 6sec, 04/18/23-SLS addressed in treatment, not re-assessed.  3.  Able to complete unilateral heel raise R LE from 6 reps to 15 ore greater Baseline:  Goal status: IN PROGRESS 3 /28/24 04/05/23: 14 reps R, 15 reps L   04/13/23  RT 15 x with pain progressing  PLAN:  PT FREQUENCY: 1-2x/week  PT DURATION: 12 weeks  PLANNED INTERVENTIONS: Therapeutic exercises, Therapeutic activity, Neuromuscular re-education, Balance training, Gait training, Patient/Family education, Self Care, and Joint mobilization  PLAN FOR NEXT SESSION continue to progress with overall LE strrength and balance, dry needling as needed.Discussed D/C at end of cert period 0/10  Marylene Land Nickie Deren PTA 04/20/23 1:51 PM  [facility] [address]. [place], [place], [address] Phone: [phone]   Fax:  [phone]  Patient Details  Name: Patty [name] MRN: [id number] Date of Birth: Apr 26, 1951 Referring Provider:  Willow Ora, [facility] [place], [place], [address] Phone: [phone]   Fax:  [phone]

## 2023-04-24 ENCOUNTER — Ambulatory Visit: Payer: Medicare Other | Admitting: Physical Therapy

## 2023-04-26 ENCOUNTER — Encounter: Payer: Self-pay | Admitting: Physical Therapy

## 2023-04-26 ENCOUNTER — Ambulatory Visit: Payer: Medicare Other | Attending: Sports Medicine | Admitting: Physical Therapy

## 2023-04-26 DIAGNOSIS — R2689 Other abnormalities of gait and mobility: Secondary | ICD-10-CM | POA: Diagnosis not present

## 2023-04-26 DIAGNOSIS — M79661 Pain in right lower leg: Secondary | ICD-10-CM

## 2023-04-26 DIAGNOSIS — R262 Difficulty in walking, not elsewhere classified: Secondary | ICD-10-CM | POA: Insufficient documentation

## 2023-04-26 NOTE — Therapy (Signed)
OUTPATIENT PHYSICAL THERAPY LOWER EXTREMITY     Patient Name: Patty [name] MRN: [id number] DOB:Dec 21, 1951, 72 y.o., female Today's Date: 04/26/2023  END OF SESSION:  PT End of Session - 04/26/23 1455     Visit Number 15    Date for PT Re-Evaluation 05/18/23    PT Start Time 1455    PT Stop Time 1535    PT Time Calculation (min) 40 min    Activity Tolerance Patient tolerated treatment well    Behavior During Therapy [facility] for tasks assessed/performed               Past Medical History:  Diagnosis Date   Abnormal nuclear stress test    a. 07/2017: cath with no CAD   Anxiety    Aortic atherosclerosis (HCC)    Arthritis    Chronic diastolic CHF (congestive heart failure) (HCC)    Coronary artery calcification seen on CT scan    Crohn disease (HCC)    DVT (deep venous thrombosis) (HCC)    Dyspnea    Grade I diastolic dysfunction    Hyperlipidemia    ILD (interstitial lung disease) (HCC)    OSA on CPAP    CPAP    AND O2   Pernicious anemia    Polycythemia vera (HCC)    Pulmonary embolism (HCC) 2010   s/p hip surgery , reports this was never truly confirmed    Pulmonary hypertension (HCC)    Sleep disorder 01/10/2020   Uses klonopin for sleep since 2014; sleep walking. And anxiety   Past Surgical History:  Procedure Laterality Date    RIGHT BREAST LUMPECTOMY WITH RADIOACTIVE SEED LOCALIZATION (Right Breast)  02/04/2021   BIOPSY  09/30/2019   Procedure: BIOPSY;  Surgeon: Beverley Fiedler, MD;  Location: [facility];  Service: Gastroenterology;;   BREAST LUMPECTOMY WITH RADIOACTIVE SEED LOCALIZATION Right 02/04/2021   Procedure: RIGHT BREAST LUMPECTOMY WITH RADIOACTIVE SEED LOCALIZATION;  Surgeon: Abigail Miyamoto, MD;  Location: [facility] OR;  Service: General;  Laterality: Right;   CARDIAC CATHETERIZATION     COLON RESECTION  1993   COLONOSCOPY WITH PROPOFOL N/A 09/30/2019   Procedure: COLONOSCOPY WITH PROPOFOL;  Surgeon: Beverley Fiedler, MD;  Location: [facility];   Service: Gastroenterology;  Laterality: N/A;   HIP ARTHROPLASTY Right    x 2, initial right THA, then subsequent right THA revision    LEFT HEART CATH AND CORONARY ANGIOGRAPHY N/A 07/28/2017   Procedure: Left Heart Cath and Coronary Angiography;  Surgeon: Kathleene Hazel, MD;  Location: [facility] INVASIVE CV LAB;  Service: Cardiovascular;  Laterality: N/A;   POLYPECTOMY  09/30/2019   Procedure: POLYPECTOMY;  Surgeon: Beverley Fiedler, MD;  Location: [facility];  Service: Gastroenterology;;   RIGHT HEART CATH N/A 12/07/2020   Procedure: RIGHT HEART CATH;  Surgeon: Tonny Bollman, MD;  Location: [facility] INVASIVE CV LAB;  Service: Cardiovascular;  Laterality: N/A;   Patient Active Problem List   Diagnosis Date Noted   Pulmonary emphysema with fibrosis of lung (HCC) 01/31/2022   Sclerosing adenosis of right breast 02/04/2021   Prediabetes 02/01/2021   Pulmonary hypertension, unspecified (HCC) 12/07/2020   Osteopenia 08/05/2020   Adjustment disorder with anxiety 04/29/2020   Sleep disorder 01/10/2020   Chronic allergic rhinitis 01/10/2020   Pernicious anemia 01/10/2020   Chronic prescription benzodiazepine use 01/10/2020   Benign neoplasm of rectum    Dyslipidemia 09/24/2019   Morbid obesity (HCC) 09/24/2019   History of DVT (deep vein thrombosis), with PE 07/25/2019   OSA  on CPAP    Crohn disease (HCC) 06/15/2017   ILD (interstitial lung disease) (HCC) 05/29/2017   Chronic respiratory failure with hypoxia (HCC) 05/29/2017   History of polycythemia vera 11/12/2014    PCP: Asencion Partridge, MD  REFERRING PROVIDER: Delfin Gant MD  REFERRING DIAG: R lower leg pain  THERAPY DIAG:  Pain in right lower leg  Impairment of balance  Difficulty in walking, not elsewhere classified  Rationale for Evaluation and Treatment: Rehabilitation  ONSET DATE: August 2023  SUBJECTIVE:   SUBJECTIVE STATEMENT:   Patient reports that she continues to have some weakness and pain in the R leg, worst  first thing in the morning.  PERTINENT HISTORY: Had new shoes, reached forward to the ground, shoes were taller, strained my R calf PAIN:  Are you having pain? Yes: Pain location: R lateral lower leg Pain description: aches, deep ache Aggravating factors: sit to stand Relieving factors: rests  PRECAUTIONS: Other: using on demand O2, checked her own O2 sats throughout eval  WEIGHT BEARING RESTRICTIONS: No  FALLS:  Has patient fallen in last 6 months? No  LIVING ENVIRONMENT: Lives with: lives alone Lives in: House/apartment Stairs: Yes: Internal: 2 steps; can reach both Has following equipment at home: Single point cane2  OCCUPATION: retired  PLOF: Independent  PATIENT GOALS: to be more I, stop pain R leg, avoid falls   NEXT MD VISIT: 05/25/23  OBJECTIVE:   DIAGNOSTIC FINDINGS: na, patient reports x rays - R ankle  PATIENT SURVEYS:  LEFS 30/80  62.5% disability COGNITION: Overall cognitive status: Within functional limits for tasks assessed     SENSATION: WFL  EDEMA:  None noted  MUSCLE LENGTH: Hamstrings: Right wfl deg; Left wfl deg  POSTURE:  R hip ER in standing, overpronates B ankles,with gait, greater R than L R medial gastrocs atrophied compared to L  PALPATION: Tender , thickened R medial gastrocs muscle belly, non tender post knee tender R fibular head  LOWER EXTREMITY ROM:  Passive ROM Right eval Left eval RT 03/21/23 04/05/23 R 04/13/23 RT  Hip flexion 90      Hip extension       Hip abduction       Hip adduction       Hip internal rotation       Hip external rotation       Knee flexion       Knee extension       Ankle dorsiflexion 6 12 12 10 15   Ankle plantarflexion wfl wfl     Ankle inversion Wfl wfl     Ankle eversion Wfl wfl      (Blank rows = not tested)  LOWER EXTREMITY MMT:MMT B LE's equal and wnl.  Single leg heel raises 15 reps L, 13 reps R each FUNCTIONAL TESTS:  Single leg stance R and L 3 sec each  GAIT: Distance walked:  70' within department, rested and self monitored her pulse oxygen, dropped to 88 x 2 todaybut rebounded Assistive device utilized:  supplemental on demand O2 Level of assistance: Complete Independence Comments: na  TODAY'S TREATMENT:  DATE:  04/26/23 NuStep L5 x 8 minutes STM to R medial calf, medial HS, ITB F/B stretch to same muscles Strengthening to calves- Heel raises on air ex plank, 2 x 10 Modified dead lift with 4#, target placed 24" off floor, 2 x 10 HS curl 35# 2 sets 15 Knee ext 25 # 3 sets 10  04/20/23 Nustep L 5 8 min HS curl 35# 2 sets 15 Knee ext 25 # 3 sets 10 In // bars 3# ankle wts on airex marching, SL hip flex,ext and abd 20 x In //bars on airex 6 inch alt step tap 20 x 2 sets In // bars on airex stepping on and off 6 inch fwd and laterally 10 x each leg In // bars dyna disc BIL ankle ROM 4 way 15 x each    04/18/23 Pedalled bike x 4 min, then 3 x 15 sec fast pedals for NM re-ed and coordination, ST assessment- R lower leg TTP ITB insertion, Med/prox calf Side stepping on black floor mat in each direction, then alternately tapping orange cones while stanidng on black floor mat with side stepping. Seated ankle strengthening- ball squeeze 10 x 5 sec holds, B ankle eversion against Red Tband resistance, DF against R tband, 2 x 10 each. Stand on step, calf stretch, both gastroc, then soleus Standing hip abd against Red Tband at knees. BUE support on counter, 1 x 10 then 1 x 5 each, VC for posture and technique.  04/13/23 Assessed and documented goals Assessed and documented DF Nustep L 5 Preformed and Issued HEP on airex as pt purchased one -toe raises, heel raises, SLS, marching and Red tband SL kicks 3 way, tandem stance Calf raises on steps 4 inch 15- issued for HEP 4 Inch step up ups 10 x fwd and laterally BIL- issued for HEP HS curl  35# 2 sets 15 Knee ext 25 # 3 sets 10 Resisted gait 4 way 4 x each 30# DN RT lateral LE more distal than last session by MAlbrightPT  04/11/23  In // bars on foam beam tandem fwd and back, side stepping, heel raises, toe raises, marching In // bars on sit fit BIL squats 2 sets 10 BOSU step  10 x BIL fwd and laterally in // bars  Standing on airex Red TBand kicks 3 way 15 x BIL Educ on possibly getting airex for home  and versatility of ex for balance and strength with emphasis on small muscles HS curl 35# 2 sets 15 Knee ext 25 # 3 sets 10 DN RT lateral LE by MAlbrightPT  04/05/23:  HS curl 35# 2 sets 15 Knee ext 25 # 3 sets 10 Standing on airex, toe raises at counter Standing on airex tandem, with vertical head movements and rotation Leg press: 30#, 3 sets 15 reps Black bar, heel raises, cues for upright posture.  Sit to stand from mat table, with 2 # mediball and feet on airex, 5 reps.  04/04/23 STW to BIL calfs and stretching to calfs and ankles DN by Octavio Graves PT STS on airex 10 x without UE On airex heel raises/toe raise 10 x. HHA alt LE marching 20 x HS curl 35# 2 sets 15 Knee ext 25 # 3 sets 10 Black Bar Heel raise /toe raise 20x each  03/23/23 Nustep L 4 5 min LE only 30# resisted gait 5 x 4 ways Black bar standing heel raise/toe raise 2 sets 10 HS curl 35# 2 sets 15 Knee ext 25 #  3 sets 10 6 inch step up 10 leading RT 10 x leading left CGA with cuing STS airex 3 sets 10 Red tband DF 2 sets 10 Seated for toe raises, red mediball between heels 20x STW and stretching to RT ankle and calf  PATIENT EDUCATION:  Education details: POC Person educated: Patient Education method: Programmer, multimedia, Facilities manager, Verbal cues, and Handouts Education comprehension: verbalized understanding, returned demonstration, and verbal cues required  HOME EXERCISE PROGRAM: Access Code: [email or web address] URL: [email or web address] Date: 02/23/2023 Prepared by: Linton Rump  Speaks  Exercises - Seated Calf Stretch with Strap  - 3 x daily - 7 x weekly - 1 sets - 3 reps - 30 hold - Long Sitting Ankle Plantar Flexion with Resistance  - 1 x daily - 7 x weekly - 2 sets - 10 reps  ASSESSMENT:  CLINICAL IMPRESSION Patient reports that her R calf and HS are tight and feel weak first thing in the morning and after she has been sitting for a while. Provided STM to those areas as well as R ITB F/B stretching to same areas. She then performed strengthening activities. Tolerated all without C/O. Educated that she may have some achiness int the areas of deep tissue mobilization.  OBJECTIVE IMPAIRMENTS: cardiopulmonary status limiting activity, decreased activity tolerance, decreased balance, decreased endurance, difficulty walking, decreased ROM, decreased strength, hypomobility, and pain.   ACTIVITY LIMITATIONS: carrying, lifting, bending, standing, squatting, stairs, transfers, and locomotion level  PARTICIPATION LIMITATIONS: meal prep, laundry, and community activity  PERSONAL FACTORS: Fitness and 1-2 comorbidities: pulmonary fibrosis, h/o R hip and pelvic fracture  are also affecting patient's functional outcome.   REHAB POTENTIAL: Good  CLINICAL DECISION MAKING: Stable/uncomplicated  EVALUATION COMPLEXITY: Low   GOALS: Goals reviewed with patient? Yes  SHORT TERM GOALS: Target date: 03/09/23 I HEP Baseline: intiated today Goal status: IN PROGRESS  03/23/23 MET  LONG TERM GOALS: Target date: 5/23/242.  1.Improve R ankle Rom for dorsiflexion from 6 to 10 degrees or greater Baseline: 6 Goal status: IN PROGRESS  03/23/23 MET 04/05/23: 10 degrees met so far 04/13/23 MET  2.  Improve unilateral balance to 10 sec or greater on each leg for reduced fall risk Baseline: 3 sec each leg Goal status: IN PROGRESS  03/23/23 04/05/23: goal progressing so far, 10 sec L leg, 6 R   04/13/23 Left 15 sec. RT 6sec, 04/18/23-SLS addressed in treatment, not re-assessed.  3.  Able to  complete unilateral heel raise R LE from 6 reps to 15 ore greater Baseline:  Goal status: IN PROGRESS 3 /28/24 04/05/23: 14 reps R, 15 reps L   04/13/23  RT 15 x with pain progressing, 04/26/23-performed B heel raises x 20 reps. Ongoing.  PLAN:  PT FREQUENCY: 1-2x/week  PT DURATION: 12 weeks  PLANNED INTERVENTIONS: Therapeutic exercises, Therapeutic activity, Neuromuscular re-education, Balance training, Gait training, Patient/Family education, Self Care, and Joint mobilization  PLAN FOR NEXT SESSION continue to progress with overall LE strrength and balance, dry needling as needed.Discussed D/C at end of cert period 5/40  Urijah Raynor DPT 04/26/23 3:44 PM  [facility] at [facility] [address] [place], [place], [address] Phone: [phone]   Fax:  [phone]  Patient Details  Name: Leialoha Hanna MRN: [id number] Date of Birth: July 01, 1951 Referring Provider:  Willow Ora, [facility] at [facility] [place], [place], [address] Phone: [phone]   Fax:  [phone]

## 2023-05-02 ENCOUNTER — Ambulatory Visit: Payer: Medicare Other | Admitting: Physical Therapy

## 2023-05-02 ENCOUNTER — Encounter: Payer: Self-pay | Admitting: Physical Therapy

## 2023-05-02 DIAGNOSIS — R262 Difficulty in walking, not elsewhere classified: Secondary | ICD-10-CM | POA: Diagnosis not present

## 2023-05-02 DIAGNOSIS — R2689 Other abnormalities of gait and mobility: Secondary | ICD-10-CM | POA: Diagnosis not present

## 2023-05-02 DIAGNOSIS — M79661 Pain in right lower leg: Secondary | ICD-10-CM

## 2023-05-02 NOTE — Therapy (Signed)
OUTPATIENT PHYSICAL THERAPY LOWER EXTREMITY     Patient Name: Nydia Iorio MRN: [id number] DOB:Mar 25, 1951, 72 y.o., female Today's Date: 05/02/2023  END OF SESSION:  PT End of Session - 05/02/23 1344     Visit Number 16    Date for PT Re-Evaluation 05/18/23    PT Start Time 1345    PT Stop Time 1430    PT Time Calculation (min) 45 min    Activity Tolerance Patient tolerated treatment well    Behavior During Therapy [facility] for tasks assessed/performed               Past Medical History:  Diagnosis Date   Abnormal nuclear stress test    a. 07/2017: cath with no CAD   Anxiety    Aortic atherosclerosis (HCC)    Arthritis    Chronic diastolic CHF (congestive heart failure) (HCC)    Coronary artery calcification seen on CT scan    Crohn disease (HCC)    DVT (deep venous thrombosis) (HCC)    Dyspnea    Grade I diastolic dysfunction    Hyperlipidemia    ILD (interstitial lung disease) (HCC)    OSA on CPAP    CPAP    AND O2   Pernicious anemia    Polycythemia vera (HCC)    Pulmonary embolism (HCC) 2010   s/p hip surgery , reports this was never truly confirmed    Pulmonary hypertension (HCC)    Sleep disorder 01/10/2020   Uses klonopin for sleep since 2014; sleep walking. And anxiety   Past Surgical History:  Procedure Laterality Date    RIGHT BREAST LUMPECTOMY WITH RADIOACTIVE SEED LOCALIZATION (Right Breast)  02/04/2021   BIOPSY  09/30/2019   Procedure: BIOPSY;  Surgeon: Beverley Fiedler, MD;  Location: [facility];  Service: Gastroenterology;;   BREAST LUMPECTOMY WITH RADIOACTIVE SEED LOCALIZATION Right 02/04/2021   Procedure: RIGHT BREAST LUMPECTOMY WITH RADIOACTIVE SEED LOCALIZATION;  Surgeon: Abigail Miyamoto, MD;  Location: [facility] OR;  Service: General;  Laterality: Right;   CARDIAC CATHETERIZATION     COLON RESECTION  1993   COLONOSCOPY WITH PROPOFOL N/A 09/30/2019   Procedure: COLONOSCOPY WITH PROPOFOL;  Surgeon: Beverley Fiedler, MD;  Location: [facility];   Service: Gastroenterology;  Laterality: N/A;   HIP ARTHROPLASTY Right    x 2, initial right THA, then subsequent right THA revision    LEFT HEART CATH AND CORONARY ANGIOGRAPHY N/A 07/28/2017   Procedure: Left Heart Cath and Coronary Angiography;  Surgeon: Kathleene Hazel, MD;  Location: [facility] INVASIVE CV LAB;  Service: Cardiovascular;  Laterality: N/A;   POLYPECTOMY  09/30/2019   Procedure: POLYPECTOMY;  Surgeon: Beverley Fiedler, MD;  Location: [facility];  Service: Gastroenterology;;   RIGHT HEART CATH N/A 12/07/2020   Procedure: RIGHT HEART CATH;  Surgeon: Tonny Bollman, MD;  Location: [facility] INVASIVE CV LAB;  Service: Cardiovascular;  Laterality: N/A;   Patient Active Problem List   Diagnosis Date Noted   Pulmonary emphysema with fibrosis of lung (HCC) 01/31/2022   Sclerosing adenosis of right breast 02/04/2021   Prediabetes 02/01/2021   Pulmonary hypertension, unspecified (HCC) 12/07/2020   Osteopenia 08/05/2020   Adjustment disorder with anxiety 04/29/2020   Sleep disorder 01/10/2020   Chronic allergic rhinitis 01/10/2020   Pernicious anemia 01/10/2020   Chronic prescription benzodiazepine use 01/10/2020   Benign neoplasm of rectum    Dyslipidemia 09/24/2019   Morbid obesity (HCC) 09/24/2019   History of DVT (deep vein thrombosis), with PE 07/25/2019   OSA  on CPAP    Crohn disease (HCC) 06/15/2017   ILD (interstitial lung disease) (HCC) 05/29/2017   Chronic respiratory failure with hypoxia (HCC) 05/29/2017   History of polycythemia vera 11/12/2014    PCP: Asencion Partridge, MD  REFERRING PROVIDER: Delfin Gant MD  REFERRING DIAG: R lower leg pain  THERAPY DIAG:  Pain in right lower leg  Impairment of balance  Difficulty in walking, not elsewhere classified  Rationale for Evaluation and Treatment: Rehabilitation  ONSET DATE: August 2023  SUBJECTIVE:   SUBJECTIVE STATEMENT:   "Im good"  PERTINENT HISTORY: Had new shoes, reached forward to the ground, shoes  were taller, strained my R calf PAIN:  Are you having pain? Yes: Pain location: R lateral lower leg Pain description: aches, deep ache Aggravating factors: sit to stand Relieving factors: rests  PRECAUTIONS: Other: using on demand O2, checked her own O2 sats throughout eval  WEIGHT BEARING RESTRICTIONS: No  FALLS:  Has patient fallen in last 6 months? No  LIVING ENVIRONMENT: Lives with: lives alone Lives in: House/apartment Stairs: Yes: Internal: 2 steps; can reach both Has following equipment at home: Single point cane2  OCCUPATION: retired  PLOF: Independent  PATIENT GOALS: to be more I, stop pain R leg, avoid falls   NEXT MD VISIT: 05/25/23  OBJECTIVE:   DIAGNOSTIC FINDINGS: na, patient reports x rays - R ankle  PATIENT SURVEYS:  LEFS 30/80  62.5% disability COGNITION: Overall cognitive status: Within functional limits for tasks assessed     SENSATION: WFL  EDEMA:  None noted  MUSCLE LENGTH: Hamstrings: Right wfl deg; Left wfl deg  POSTURE:  R hip ER in standing, overpronates B ankles,with gait, greater R than L R medial gastrocs atrophied compared to L  PALPATION: Tender , thickened R medial gastrocs muscle belly, non tender post knee tender R fibular head  LOWER EXTREMITY ROM:  Passive ROM Right eval Left eval RT 03/21/23 04/05/23 R 04/13/23 RT  Hip flexion 90      Hip extension       Hip abduction       Hip adduction       Hip internal rotation       Hip external rotation       Knee flexion       Knee extension       Ankle dorsiflexion 6 12 12 10 15   Ankle plantarflexion wfl wfl     Ankle inversion Wfl wfl     Ankle eversion Wfl wfl      (Blank rows = not tested)  LOWER EXTREMITY MMT:MMT B LE's equal and wnl.  Single leg heel raises 15 reps L, 13 reps R each FUNCTIONAL TESTS:  Single leg stance R and L 3 sec each  GAIT: Distance walked: 30' within department, rested and self monitored her pulse oxygen, dropped to 88 x 2 todaybut  rebounded Assistive device utilized:  supplemental on demand O2 Level of assistance: Complete Independence Comments: na  TODAY'S TREATMENT:  DATE:  05/02/23 NuStep L5 x 8 min HS curl 35# 2 sets 15 Knee ext 25 # 3 sets 10 Gait 3 laps w/ 3L O2 x2 seated rest in between  S2S holding yellow ball with O2 on back 2x10 Step ups 6 in x5, x10 each  On airex ball toss  04/26/23 NuStep L5 x 8 minutes STM to R medial calf, medial HS, ITB F/B stretch to same muscles Strengthening to calves- Heel raises on air ex plank, 2 x 10 Modified dead lift with 4#, target placed 24" off floor, 2 x 10 HS curl 35# 2 sets 15 Knee ext 25 # 3 sets 10  04/20/23 Nustep L 5 8 min HS curl 35# 2 sets 15 Knee ext 25 # 3 sets 10 In // bars 3# ankle wts on airex marching, SL hip flex,ext and abd 20 x In //bars on airex 6 inch alt step tap 20 x 2 sets In // bars on airex stepping on and off 6 inch fwd and laterally 10 x each leg In // bars dyna disc BIL ankle ROM 4 way 15 x each    04/18/23 Pedalled bike x 4 min, then 3 x 15 sec fast pedals for NM re-ed and coordination, ST assessment- R lower leg TTP ITB insertion, Med/prox calf Side stepping on black floor mat in each direction, then alternately tapping orange cones while stanidng on black floor mat with side stepping. Seated ankle strengthening- ball squeeze 10 x 5 sec holds, B ankle eversion against Red Tband resistance, DF against R tband, 2 x 10 each. Stand on step, calf stretch, both gastroc, then soleus Standing hip abd against Red Tband at knees. BUE support on counter, 1 x 10 then 1 x 5 each, VC for posture and technique.  04/13/23 Assessed and documented goals Assessed and documented DF Nustep L 5 Preformed and Issued HEP on airex as pt purchased one -toe raises, heel raises, SLS, marching and Red tband SL kicks 3 way, tandem  stance Calf raises on steps 4 inch 15- issued for HEP 4 Inch step up ups 10 x fwd and laterally BIL- issued for HEP HS curl 35# 2 sets 15 Knee ext 25 # 3 sets 10 Resisted gait 4 way 4 x each 30# DN RT lateral LE more distal than last session by MAlbrightPT  04/11/23  In // bars on foam beam tandem fwd and back, side stepping, heel raises, toe raises, marching In // bars on sit fit BIL squats 2 sets 10 BOSU step  10 x BIL fwd and laterally in // bars  Standing on airex Red TBand kicks 3 way 15 x BIL Educ on possibly getting airex for home  and versatility of ex for balance and strength with emphasis on small muscles HS curl 35# 2 sets 15 Knee ext 25 # 3 sets 10 DN RT lateral LE by MAlbrightPT  04/05/23:  HS curl 35# 2 sets 15 Knee ext 25 # 3 sets 10 Standing on airex, toe raises at counter Standing on airex tandem, with vertical head movements and rotation Leg press: 30#, 3 sets 15 reps Black bar, heel raises, cues for upright posture.  Sit to stand from mat table, with 2 # mediball and feet on airex, 5 reps.  04/04/23 STW to BIL calfs and stretching to calfs and ankles DN by Octavio Graves PT STS on airex 10 x without UE On airex heel raises/toe raise 10 x. HHA alt LE marching 20 x HS curl  35# 2 sets 15 Knee ext 25 # 3 sets 10 Black Bar Heel raise /toe raise 20x each  03/23/23 Nustep L 4 5 min LE only 30# resisted gait 5 x 4 ways Black bar standing heel raise/toe raise 2 sets 10 HS curl 35# 2 sets 15 Knee ext 25 # 3 sets 10 6 inch step up 10 leading RT 10 x leading left CGA with cuing STS airex 3 sets 10 Red tband DF 2 sets 10 Seated for toe raises, red mediball between heels 20x STW and stretching to RT ankle and calf  PATIENT EDUCATION:  Education details: POC Person educated: Patient Education method: Programmer, multimedia, Facilities manager, Verbal cues, and Handouts Education comprehension: verbalized understanding, returned demonstration, and verbal cues required  HOME EXERCISE  PROGRAM: Access Code: [email or web address] URL: [email or web address] Date: 02/23/2023 Prepared by: Caralee Ates  Exercises - Seated Calf Stretch with Strap  - 3 x daily - 7 x weekly - 1 sets - 3 reps - 30 hold - Long Sitting Ankle Plantar Flexion with Resistance  - 1 x daily - 7 x weekly - 2 sets - 10 reps  ASSESSMENT:  CLINICAL IMPRESSION Pt enters feeling well. Session focused more on functional interventions. Some instability stepping back with RLE during step ups. O2 monitored during session and it would drop to 83% with activity requiring little time to recover. One standing rest required during each gait trial.   OBJECTIVE IMPAIRMENTS: cardiopulmonary status limiting activity, decreased activity tolerance, decreased balance, decreased endurance, difficulty walking, decreased ROM, decreased strength, hypomobility, and pain.   ACTIVITY LIMITATIONS: carrying, lifting, bending, standing, squatting, stairs, transfers, and locomotion level  PARTICIPATION LIMITATIONS: meal prep, laundry, and community activity  PERSONAL FACTORS: Fitness and 1-2 comorbidities: pulmonary fibrosis, h/o R hip and pelvic fracture  are also affecting patient's functional outcome.   REHAB POTENTIAL: Good  CLINICAL DECISION MAKING: Stable/uncomplicated  EVALUATION COMPLEXITY: Low   GOALS: Goals reviewed with patient? Yes  SHORT TERM GOALS: Target date: 03/09/23 I HEP Baseline: intiated today Goal status: IN PROGRESS  03/23/23 MET  LONG TERM GOALS: Target date: 5/23/242.  1.Improve R ankle Rom for dorsiflexion from 6 to 10 degrees or greater Baseline: 6 Goal status: IN PROGRESS  03/23/23 MET 04/05/23: 10 degrees met so far 04/13/23 MET  2.  Improve unilateral balance to 10 sec or greater on each leg for reduced fall risk Baseline: 3 sec each leg Goal status: IN PROGRESS  03/23/23 04/05/23: goal progressing so far, 10 sec L leg, 6 R   04/13/23 Left 15 sec. RT 6sec, 04/18/23-SLS addressed in treatment, not  re-assessed.  3.  Able to complete unilateral heel raise R LE from 6 reps to 15 ore greater Baseline:  Goal status: IN PROGRESS 3 /28/24 04/05/23: 14 reps R, 15 reps L   04/13/23  RT 15 x with pain progressing, 04/26/23-performed B heel raises x 20 reps. Ongoing.  PLAN:  PT FREQUENCY: 1-2x/week  PT DURATION: 12 weeks  PLANNED INTERVENTIONS: Therapeutic exercises, Therapeutic activity, Neuromuscular re-education, Balance training, Gait training, Patient/Family education, Self Care, and Joint mobilization  PLAN FOR NEXT SESSION continue to progress with overall LE strrength and balance, dry needling as needed.Discussed D/C at end of cert period 5/40  Debroah Baller, PTA 05/02/23 1:45 PM

## 2023-05-04 ENCOUNTER — Ambulatory Visit: Payer: Medicare Other | Admitting: Physical Therapy

## 2023-05-04 DIAGNOSIS — R262 Difficulty in walking, not elsewhere classified: Secondary | ICD-10-CM | POA: Diagnosis not present

## 2023-05-04 DIAGNOSIS — R2689 Other abnormalities of gait and mobility: Secondary | ICD-10-CM | POA: Diagnosis not present

## 2023-05-04 DIAGNOSIS — M79661 Pain in right lower leg: Secondary | ICD-10-CM

## 2023-05-04 NOTE — Therapy (Signed)
OUTPATIENT PHYSICAL THERAPY LOWER EXTREMITY     Patient Name: Patty [name] MRN: [id number] DOB:06-28-1951, 72 y.o., female Today's Date: 05/04/2023  END OF SESSION:  PT End of Session - 05/04/23 1359     Visit Number 17    Date for PT Re-Evaluation 05/18/23    PT Start Time 1400    PT Stop Time 1445    PT Time Calculation (min) 45 min               Past Medical History:  Diagnosis Date   Abnormal nuclear stress test    a. 07/2017: cath with no CAD   Anxiety    Aortic atherosclerosis (HCC)    Arthritis    Chronic diastolic CHF (congestive heart failure) (HCC)    Coronary artery calcification seen on CT scan    Crohn disease (HCC)    DVT (deep venous thrombosis) (HCC)    Dyspnea    Grade I diastolic dysfunction    Hyperlipidemia    ILD (interstitial lung disease) (HCC)    OSA on CPAP    CPAP    AND O2   Pernicious anemia    Polycythemia vera (HCC)    Pulmonary embolism (HCC) 2010   s/p hip surgery , reports this was never truly confirmed    Pulmonary hypertension (HCC)    Sleep disorder 01/10/2020   Uses klonopin for sleep since 2014; sleep walking. And anxiety   Past Surgical History:  Procedure Laterality Date    RIGHT BREAST LUMPECTOMY WITH RADIOACTIVE SEED LOCALIZATION (Right Breast)  02/04/2021   BIOPSY  09/30/2019   Procedure: BIOPSY;  Surgeon: Beverley Fiedler, MD;  Location: [facility];  Service: Gastroenterology;;   BREAST LUMPECTOMY WITH RADIOACTIVE SEED LOCALIZATION Right 02/04/2021   Procedure: RIGHT BREAST LUMPECTOMY WITH RADIOACTIVE SEED LOCALIZATION;  Surgeon: Abigail Miyamoto, MD;  Location: [facility];  Service: General;  Laterality: Right;   CARDIAC CATHETERIZATION     COLON RESECTION  1993   COLONOSCOPY WITH PROPOFOL N/A 09/30/2019   Procedure: COLONOSCOPY WITH PROPOFOL;  Surgeon: Beverley Fiedler, MD;  Location: [facility];  Service: Gastroenterology;  Laterality: N/A;   HIP ARTHROPLASTY Right    x 2, initial right THA, then subsequent right  THA revision    LEFT HEART CATH AND CORONARY ANGIOGRAPHY N/A 07/28/2017   Procedure: Left Heart Cath and Coronary Angiography;  Surgeon: Kathleene Hazel, MD;  Location: [facility] INVASIVE CV LAB;  Service: Cardiovascular;  Laterality: N/A;   POLYPECTOMY  09/30/2019   Procedure: POLYPECTOMY;  Surgeon: Beverley Fiedler, MD;  Location: [facility];  Service: Gastroenterology;;   RIGHT HEART CATH N/A 12/07/2020   Procedure: RIGHT HEART CATH;  Surgeon: Tonny Bollman, MD;  Location: [facility] INVASIVE CV LAB;  Service: Cardiovascular;  Laterality: N/A;   Patient Active Problem List   Diagnosis Date Noted   Pulmonary emphysema with fibrosis of lung (HCC) 01/31/2022   Sclerosing adenosis of right breast 02/04/2021   Prediabetes 02/01/2021   Pulmonary hypertension, unspecified (HCC) 12/07/2020   Osteopenia 08/05/2020   Adjustment disorder with anxiety 04/29/2020   Sleep disorder 01/10/2020   Chronic allergic rhinitis 01/10/2020   Pernicious anemia 01/10/2020   Chronic prescription benzodiazepine use 01/10/2020   Benign neoplasm of rectum    Dyslipidemia 09/24/2019   Morbid obesity (HCC) 09/24/2019   History of DVT (deep vein thrombosis), with PE 07/25/2019   OSA on CPAP    Crohn disease (HCC) 06/15/2017   ILD (interstitial lung disease) (HCC) 05/29/2017  Chronic respiratory failure with hypoxia (HCC) 05/29/2017   History of polycythemia vera 11/12/2014    PCP: Asencion Partridge, MD  REFERRING PROVIDER: Delfin Gant MD  REFERRING DIAG: R lower leg pain  THERAPY DIAG:  Pain in right lower leg  Impairment of balance  Difficulty in walking, not elsewhere classified  Rationale for Evaluation and Treatment: Rehabilitation  ONSET DATE: August 2023  SUBJECTIVE:   SUBJECTIVE STATEMENT:   I think I have a sinus infection.[name] bought thera gun. Still LE weakness. Pain RT medial knee  PERTINENT HISTORY: Had new shoes, reached forward to the ground, shoes were taller, strained my R calf PAIN:   Are you having pain? RT medial knee  PRECAUTIONS: Other: using on demand O2, checked her own O2 sats throughout eval  WEIGHT BEARING RESTRICTIONS: No  FALLS:  Has patient fallen in last 6 months? No  LIVING ENVIRONMENT: Lives with: lives alone Lives in: House/apartment Stairs: Yes: Internal: 2 steps; can reach both Has following equipment at home: Single point cane2  OCCUPATION: retired  PLOF: Independent  PATIENT GOALS: to be more I, stop pain R leg, avoid falls   NEXT MD VISIT: 05/25/23  OBJECTIVE:   DIAGNOSTIC FINDINGS: na, patient reports x rays - R ankle  PATIENT SURVEYS:  LEFS 30/80  62.5% disability COGNITION: Overall cognitive status: Within functional limits for tasks assessed     SENSATION: WFL  EDEMA:  None noted  MUSCLE LENGTH: Hamstrings: Right wfl deg; Left wfl deg  POSTURE:  R hip ER in standing, overpronates B ankles,with gait, greater R than L R medial gastrocs atrophied compared to L  PALPATION: Tender , thickened R medial gastrocs muscle belly, non tender post knee tender R fibular head  LOWER EXTREMITY ROM:  Passive ROM Right eval Left eval RT 03/21/23 04/05/23 R 04/13/23 RT  Hip flexion 90      Hip extension       Hip abduction       Hip adduction       Hip internal rotation       Hip external rotation       Knee flexion       Knee extension       Ankle dorsiflexion 6 12 12 10 15   Ankle plantarflexion wfl wfl     Ankle inversion Wfl wfl     Ankle eversion Wfl wfl      (Blank rows = not tested)  LOWER EXTREMITY MMT:MMT B LE's equal and wnl.  Single leg heel raises 15 reps L, 13 reps R each FUNCTIONAL TESTS:  Single leg stance R and L 3 sec each  GAIT: Distance walked: 32' within department, rested and self monitored her pulse oxygen, dropped to 88 x 2 todaybut rebounded Assistive device utilized:  supplemental on demand O2 Level of assistance: Complete Independence Comments: na  TODAY'S TREATMENT:  DATE:   05/04/23 Nustep L 5 8 min Leg Press 40# 2 sets 10, SL 20# 10 each Leg Press Calf raises 40# 3 sets 10 PROM and STW to LE, reviewed stretches at home S2S holding yellow ball with O2 on back 2x10 on airex HS curl 35# 2 sets 15 Knee ext 25 # 3 sets 10       05/02/23 NuStep L5 x 8 min HS curl 35# 2 sets 15 Knee ext 25 # 3 sets 10 Gait 3 laps w/ 3L O2 x2 seated rest in between  S2S holding yellow ball with O2 on back 2x10 Step ups 6 in x5, x10 each  On airex ball toss  04/26/23 NuStep L5 x 8 minutes STM to R medial calf, medial HS, ITB F/B stretch to same muscles Strengthening to calves- Heel raises on air ex plank, 2 x 10 Modified dead lift with 4#, target placed 24" off floor, 2 x 10 HS curl 35# 2 sets 15 Knee ext 25 # 3 sets 10  04/20/23 Nustep L 5 8 min HS curl 35# 2 sets 15 Knee ext 25 # 3 sets 10 In // bars 3# ankle wts on airex marching, SL hip flex,ext and abd 20 x In //bars on airex 6 inch alt step tap 20 x 2 sets In // bars on airex stepping on and off 6 inch fwd and laterally 10 x each leg In // bars dyna disc BIL ankle ROM 4 way 15 x each    04/18/23 Pedalled bike x 4 min, then 3 x 15 sec fast pedals for NM re-ed and coordination, ST assessment- R lower leg TTP ITB insertion, Med/prox calf Side stepping on black floor mat in each direction, then alternately tapping orange cones while stanidng on black floor mat with side stepping. Seated ankle strengthening- ball squeeze 10 x 5 sec holds, B ankle eversion against Red Tband resistance, DF against R tband, 2 x 10 each. Stand on step, calf stretch, both gastroc, then soleus Standing hip abd against Red Tband at knees. BUE support on counter, 1 x 10 then 1 x 5 each, VC for posture and technique.  04/13/23 Assessed and documented goals Assessed and documented DF Nustep L 5 Preformed and Issued HEP on airex  as pt purchased one -toe raises, heel raises, SLS, marching and Red tband SL kicks 3 way, tandem stance Calf raises on steps 4 inch 15- issued for HEP 4 Inch step up ups 10 x fwd and laterally BIL- issued for HEP HS curl 35# 2 sets 15 Knee ext 25 # 3 sets 10 Resisted gait 4 way 4 x each 30# DN RT lateral LE more distal than last session by MAlbrightPT  04/11/23  In // bars on foam beam tandem fwd and back, side stepping, heel raises, toe raises, marching In // bars on sit fit BIL squats 2 sets 10 BOSU step  10 x BIL fwd and laterally in // bars  Standing on airex Red TBand kicks 3 way 15 x BIL Educ on possibly getting airex for home  and versatility of ex for balance and strength with emphasis on small muscles HS curl 35# 2 sets 15 Knee ext 25 # 3 sets 10 DN RT lateral LE by MAlbrightPT  04/05/23:  HS curl 35# 2 sets 15 Knee ext 25 # 3 sets 10 Standing on airex, toe raises at counter Standing on airex tandem, with vertical head movements and rotation Leg press: 30#, 3 sets 15 reps  Black bar, heel raises, cues for upright posture.  Sit to stand from mat table, with 2 # mediball and feet on airex, 5 reps.  04/04/23 STW to BIL calfs and stretching to calfs and ankles DN by Octavio Graves PT STS on airex 10 x without UE On airex heel raises/toe raise 10 x. HHA alt LE marching 20 x HS curl 35# 2 sets 15 Knee ext 25 # 3 sets 10 Black Bar Heel raise /toe raise 20x each  03/23/23 Nustep L 4 5 min LE only 30# resisted gait 5 x 4 ways Black bar standing heel raise/toe raise 2 sets 10 HS curl 35# 2 sets 15 Knee ext 25 # 3 sets 10 6 inch step up 10 leading RT 10 x leading left CGA with cuing STS airex 3 sets 10 Red tband DF 2 sets 10 Seated for toe raises, red mediball between heels 20x STW and stretching to RT ankle and calf  PATIENT EDUCATION:  Education details: POC Person educated: Patient Education method: Programmer, multimedia, Facilities manager, Verbal cues, and Handouts Education  comprehension: verbalized understanding, returned demonstration, and verbal cues required  HOME EXERCISE PROGRAM: Access Code: [email or web address] URL: [email or web address] Date: 02/23/2023 Prepared by: Linton Rump Speaks  Exercises - Seated Calf Stretch with Strap  - 3 x daily - 7 x weekly - 1 sets - 3 reps - 30 hold - Long Sitting Ankle Plantar Flexion with Resistance  - 1 x daily - 7 x weekly - 2 sets - 10 reps  ASSESSMENT:  CLINICAL IMPRESSION Pt arrives states she thinks she has sinus infection. C/O on going leg weakness that comes and goes. States pain medial RT knee - higher than normal. Continued with strengthening of LE. STW with pain RT medilal leg. Reviewed stretches at home with pt. Rec MD follow up as we are getting to end of therapy and weakness/pain come and goes. RT foot still turns out.    OBJECTIVE IMPAIRMENTS: cardiopulmonary status limiting activity, decreased activity tolerance, decreased balance, decreased endurance, difficulty walking, decreased ROM, decreased strength, hypomobility, and pain.   ACTIVITY LIMITATIONS: carrying, lifting, bending, standing, squatting, stairs, transfers, and locomotion level  PARTICIPATION LIMITATIONS: meal prep, laundry, and community activity  PERSONAL FACTORS: Fitness and 1-2 comorbidities: pulmonary fibrosis, h/o R hip and pelvic fracture  are also affecting patient's functional outcome.   REHAB POTENTIAL: Good  CLINICAL DECISION MAKING: Stable/uncomplicated  EVALUATION COMPLEXITY: Low   GOALS: Goals reviewed with patient? Yes  SHORT TERM GOALS: Target date: 03/09/23 I HEP Baseline: intiated today Goal status: IN PROGRESS  03/23/23 MET  LONG TERM GOALS: Target date: 5/23/242.  1.Improve R ankle Rom for dorsiflexion from 6 to 10 degrees or greater Baseline: 6 Goal status: IN PROGRESS  03/23/23 MET 04/05/23: 10 degrees met so far 04/13/23 MET  2.  Improve unilateral balance to 10 sec or greater on each leg for reduced fall  risk Baseline: 3 sec each leg Goal status: IN PROGRESS  03/23/23 04/05/23: goal progressing so far, 10 sec L leg, 6 R   04/13/23 Left 15 sec. RT 6sec, 04/18/23-SLS addressed in treatment, not re-assessed.  05/04/23 met for Left not RT  3.  Able to complete unilateral heel raise R LE from 6 reps to 15 ore greater Baseline:  Goal status: IN PROGRESS 3 /28/24 04/05/23: 14 reps R, 15 reps L   04/13/23  RT 15 x with pain progressing, 04/26/23-performed B heel raises x 20 reps. Ongoing.  05/04/23 MET  PLAN:  PT FREQUENCY: 1-2x/week  PT  DURATION: 12 weeks  PLANNED INTERVENTIONS: Therapeutic exercises, Therapeutic activity, Neuromuscular re-education, Balance training, Gait training, Patient/Family education, Self Care, and Joint mobilization  PLAN FOR NEXT SESSION Discussed D/C at end of cert period 1/61  Rec MD follow up as we are getting to end of therapy and weakness/pain come and goes Amie Critchley, PTA 05/04/23 2:00 PM [facility] [address] [facility] [place], [place], [address] Phone: [phone]   Fax:  [phone]  Patient Details  Name: Kyona Burningham MRN: [id number] Date of Birth: 1951/06/11 Referring Provider:  Willow Ora, MD  Encounter Date: 05/04/2023

## 2023-05-05 DIAGNOSIS — H353112 Nonexudative age-related macular degeneration, right eye, intermediate dry stage: Secondary | ICD-10-CM | POA: Diagnosis not present

## 2023-05-05 DIAGNOSIS — H353221 Exudative age-related macular degeneration, left eye, with active choroidal neovascularization: Secondary | ICD-10-CM | POA: Diagnosis not present

## 2023-05-05 DIAGNOSIS — H40013 Open angle with borderline findings, low risk, bilateral: Secondary | ICD-10-CM | POA: Diagnosis not present

## 2023-05-05 DIAGNOSIS — H25812 Combined forms of age-related cataract, left eye: Secondary | ICD-10-CM | POA: Diagnosis not present

## 2023-05-05 LAB — HM DIABETES EYE EXAM

## 2023-05-09 ENCOUNTER — Ambulatory Visit: Payer: Medicare Other | Admitting: Physical Therapy

## 2023-05-09 ENCOUNTER — Telehealth: Payer: Self-pay | Admitting: Family Medicine

## 2023-05-09 ENCOUNTER — Encounter: Payer: Self-pay | Admitting: Physical Therapy

## 2023-05-09 DIAGNOSIS — M79661 Pain in right lower leg: Secondary | ICD-10-CM

## 2023-05-09 DIAGNOSIS — R2689 Other abnormalities of gait and mobility: Secondary | ICD-10-CM | POA: Diagnosis not present

## 2023-05-09 DIAGNOSIS — R262 Difficulty in walking, not elsewhere classified: Secondary | ICD-10-CM | POA: Diagnosis not present

## 2023-05-09 NOTE — Therapy (Signed)
OUTPATIENT PHYSICAL THERAPY LOWER EXTREMITY     Patient Name: Patty [name] MRN: [id number] DOB:1951/07/15, 72 y.o., female Today's Date: 05/09/2023  END OF SESSION:  PT End of Session - 05/09/23 1424     Visit Number 18    Date for PT Re-Evaluation 05/18/23    PT Start Time 1425    PT Stop Time 1310    PT Time Calculation (min) 1365 min    Activity Tolerance Patient tolerated treatment well    Behavior During Therapy [facility] for tasks assessed/performed               Past Medical History:  Diagnosis Date   Abnormal nuclear stress test    a. 07/2017: cath with no CAD   Anxiety    Aortic atherosclerosis (HCC)    Arthritis    Chronic diastolic CHF (congestive heart failure) (HCC)    Coronary artery calcification seen on CT scan    Crohn disease (HCC)    DVT (deep venous thrombosis) (HCC)    Dyspnea    Grade I diastolic dysfunction    Hyperlipidemia    ILD (interstitial lung disease) (HCC)    OSA on CPAP    CPAP    AND O2   Pernicious anemia    Polycythemia vera (HCC)    Pulmonary embolism (HCC) 2010   s/p hip surgery , reports this was never truly confirmed    Pulmonary hypertension (HCC)    Sleep disorder 01/10/2020   Uses klonopin for sleep since 2014; sleep walking. And anxiety   Past Surgical History:  Procedure Laterality Date    RIGHT BREAST LUMPECTOMY WITH RADIOACTIVE SEED LOCALIZATION (Right Breast)  02/04/2021   BIOPSY  09/30/2019   Procedure: BIOPSY;  Surgeon: Beverley Fiedler, MD;  Location: [facility];  Service: Gastroenterology;;   BREAST LUMPECTOMY WITH RADIOACTIVE SEED LOCALIZATION Right 02/04/2021   Procedure: RIGHT BREAST LUMPECTOMY WITH RADIOACTIVE SEED LOCALIZATION;  Surgeon: Abigail Miyamoto, MD;  Location: [facility] OR;  Service: General;  Laterality: Right;   CARDIAC CATHETERIZATION     COLON RESECTION  1993   COLONOSCOPY WITH PROPOFOL N/A 09/30/2019   Procedure: COLONOSCOPY WITH PROPOFOL;  Surgeon: Beverley Fiedler, MD;  Location: [facility];   Service: Gastroenterology;  Laterality: N/A;   HIP ARTHROPLASTY Right    x 2, initial right THA, then subsequent right THA revision    LEFT HEART CATH AND CORONARY ANGIOGRAPHY N/A 07/28/2017   Procedure: Left Heart Cath and Coronary Angiography;  Surgeon: Kathleene Hazel, MD;  Location: [facility] INVASIVE CV LAB;  Service: Cardiovascular;  Laterality: N/A;   POLYPECTOMY  09/30/2019   Procedure: POLYPECTOMY;  Surgeon: Beverley Fiedler, MD;  Location: [facility];  Service: Gastroenterology;;   RIGHT HEART CATH N/A 12/07/2020   Procedure: RIGHT HEART CATH;  Surgeon: Tonny Bollman, MD;  Location: [facility] INVASIVE CV LAB;  Service: Cardiovascular;  Laterality: N/A;   Patient Active Problem List   Diagnosis Date Noted   Pulmonary emphysema with fibrosis of lung (HCC) 01/31/2022   Sclerosing adenosis of right breast 02/04/2021   Prediabetes 02/01/2021   Pulmonary hypertension, unspecified (HCC) 12/07/2020   Osteopenia 08/05/2020   Adjustment disorder with anxiety 04/29/2020   Sleep disorder 01/10/2020   Chronic allergic rhinitis 01/10/2020   Pernicious anemia 01/10/2020   Chronic prescription benzodiazepine use 01/10/2020   Benign neoplasm of rectum    Dyslipidemia 09/24/2019   Morbid obesity (HCC) 09/24/2019   History of DVT (deep vein thrombosis), with PE 07/25/2019   OSA  on CPAP    Crohn disease (HCC) 06/15/2017   ILD (interstitial lung disease) (HCC) 05/29/2017   Chronic respiratory failure with hypoxia (HCC) 05/29/2017   History of polycythemia vera 11/12/2014    PCP: Asencion Partridge, MD  REFERRING PROVIDER: Delfin Gant MD  REFERRING DIAG: R lower leg pain  THERAPY DIAG:  Pain in right lower leg  Impairment of balance  Difficulty in walking, not elsewhere classified  Rationale for Evaluation and Treatment: Rehabilitation  ONSET DATE: August 2023  SUBJECTIVE:   SUBJECTIVE STATEMENT:   "Good" Pt soreness in her back from doing stuff around the house.  PERTINENT  HISTORY: Had new shoes, reached forward to the ground, shoes were taller, strained my R calf PAIN:  Are you having pain? Just sore in her back  PRECAUTIONS: Other: using on demand O2, checked her own O2 sats throughout eval  WEIGHT BEARING RESTRICTIONS: No  FALLS:  Has patient fallen in last 6 months? No  LIVING ENVIRONMENT: Lives with: lives alone Lives in: House/apartment Stairs: Yes: Internal: 2 steps; can reach both Has following equipment at home: Single point cane2  OCCUPATION: retired  PLOF: Independent  PATIENT GOALS: to be more I, stop pain R leg, avoid falls   NEXT MD VISIT: 05/25/23  OBJECTIVE:   DIAGNOSTIC FINDINGS: na, patient reports x rays - R ankle  PATIENT SURVEYS:  LEFS 30/80  62.5% disability COGNITION: Overall cognitive status: Within functional limits for tasks assessed     SENSATION: WFL  EDEMA:  None noted  MUSCLE LENGTH: Hamstrings: Right wfl deg; Left wfl deg  POSTURE:  R hip ER in standing, overpronates B ankles,with gait, greater R than L R medial gastrocs atrophied compared to L  PALPATION: Tender , thickened R medial gastrocs muscle belly, non tender post knee tender R fibular head  LOWER EXTREMITY ROM:  Passive ROM Right eval Left eval RT 03/21/23 04/05/23 R 04/13/23 RT  Hip flexion 90      Hip extension       Hip abduction       Hip adduction       Hip internal rotation       Hip external rotation       Knee flexion       Knee extension       Ankle dorsiflexion 6 12 12 10 15   Ankle plantarflexion wfl wfl     Ankle inversion Wfl wfl     Ankle eversion Wfl wfl      (Blank rows = not tested)  LOWER EXTREMITY MMT:MMT B LE's equal and wnl.  Single leg heel raises 15 reps L, 13 reps R each FUNCTIONAL TESTS:  Single leg stance R and L 3 sec each  GAIT: Distance walked: 43' within department, rested and self monitored her pulse oxygen, dropped to 88 x 2 todaybut rebounded Assistive device utilized:  supplemental on demand  O2 Level of assistance: Complete Independence Comments: na  TODAY'S TREATMENT:  DATE:  05/09/23 NuStep L5 x 8 min Gait 5 laps w/ 3L O2 tank on back 4 in step ups box on airex x10 each S2S LE on airex x10, holding yellow ball  Leg Press 40# 2 sets 10, SL 20# 2x10 each Heel raises black bar 2x10     05/04/23 Nustep L 5 8 min Leg Press 40# 2 sets 10, SL 20# 10 each Leg Press Calf raises 40# 3 sets 10 PROM and STW to LE, reviewed stretches at home S2S holding yellow ball with O2 on back 2x10 on airex HS curl 35# 2 sets 15 Knee ext 25 # 3 sets 10   05/02/23 NuStep L5 x 8 min HS curl 35# 2 sets 15 Knee ext 25 # 3 sets 10 Gait 3 laps w/ 3L O2 x2 seated rest in between  S2S holding yellow ball with O2 on back 2x10 Step ups 6 in x5, x10 each  On airex ball toss  04/26/23 NuStep L5 x 8 minutes STM to R medial calf, medial HS, ITB F/B stretch to same muscles Strengthening to calves- Heel raises on air ex plank, 2 x 10 Modified dead lift with 4#, target placed 24" off floor, 2 x 10 HS curl 35# 2 sets 15 Knee ext 25 # 3 sets 10  04/20/23 Nustep L 5 8 min HS curl 35# 2 sets 15 Knee ext 25 # 3 sets 10 In // bars 3# ankle wts on airex marching, SL hip flex,ext and abd 20 x In //bars on airex 6 inch alt step tap 20 x 2 sets In // bars on airex stepping on and off 6 inch fwd and laterally 10 x each leg In // bars dyna disc BIL ankle ROM 4 way 15 x each    04/18/23 Pedalled bike x 4 min, then 3 x 15 sec fast pedals for NM re-ed and coordination, ST assessment- R lower leg TTP ITB insertion, Med/prox calf Side stepping on black floor mat in each direction, then alternately tapping orange cones while stanidng on black floor mat with side stepping. Seated ankle strengthening- ball squeeze 10 x 5 sec holds, B ankle eversion against Red Tband resistance, DF against R  tband, 2 x 10 each. Stand on step, calf stretch, both gastroc, then soleus Standing hip abd against Red Tband at knees. BUE support on counter, 1 x 10 then 1 x 5 each, VC for posture and technique.  04/13/23 Assessed and documented goals Assessed and documented DF Nustep L 5 Preformed and Issued HEP on airex as pt purchased one -toe raises, heel raises, SLS, marching and Red tband SL kicks 3 way, tandem stance Calf raises on steps 4 inch 15- issued for HEP 4 Inch step up ups 10 x fwd and laterally BIL- issued for HEP HS curl 35# 2 sets 15 Knee ext 25 # 3 sets 10 Resisted gait 4 way 4 x each 30# DN RT lateral LE more distal than last session by MAlbrightPT  04/11/23  In // bars on foam beam tandem fwd and back, side stepping, heel raises, toe raises, marching In // bars on sit fit BIL squats 2 sets 10 BOSU step  10 x BIL fwd and laterally in // bars  Standing on airex Red TBand kicks 3 way 15 x BIL Educ on possibly getting airex for home  and versatility of ex for balance and strength with emphasis on small muscles HS curl 35# 2 sets 15 Knee ext 25 # 3 sets  10 DN RT lateral LE by MAlbrightPT  04/05/23:  HS curl 35# 2 sets 15 Knee ext 25 # 3 sets 10 Standing on airex, toe raises at counter Standing on airex tandem, with vertical head movements and rotation Leg press: 30#, 3 sets 15 reps Black bar, heel raises, cues for upright posture.  Sit to stand from mat table, with 2 # mediball and feet on airex, 5 reps.  04/04/23 STW to BIL calfs and stretching to calfs and ankles DN by Octavio Graves PT STS on airex 10 x without UE On airex heel raises/toe raise 10 x. HHA alt LE marching 20 x HS curl 35# 2 sets 15 Knee ext 25 # 3 sets 10 Black Bar Heel raise /toe raise 20x each  nd calf  PATIENT EDUCATION:  Education details: POC Person educated: Patient Education method: Programmer, multimedia, Facilities manager, Verbal cues, and Handouts Education comprehension: verbalized understanding,  returned demonstration, and verbal cues required  HOME EXERCISE PROGRAM: Access Code: [email or web address] URL: [email or web address] Date: 02/23/2023 Prepared by: Linton Rump Speaks  Exercises - Seated Calf Stretch with Strap  - 3 x daily - 7 x weekly - 1 sets - 3 reps - 30 hold - Long Sitting Ankle Plantar Flexion with Resistance  - 1 x daily - 7 x weekly - 2 sets - 10 reps  ASSESSMENT:  CLINICAL IMPRESSION Pt arrives with some soreness in her back due to house work. Continued with strengthening of LE and added balance. O2 did drop with with step ups Box on airex did compose some challenge, but pt able to complete. . Rec MD follow up as we are getting to end of therapy and weakness/pain come and goes. RT foot still turns out.    OBJECTIVE IMPAIRMENTS: cardiopulmonary status limiting activity, decreased activity tolerance, decreased balance, decreased endurance, difficulty walking, decreased ROM, decreased strength, hypomobility, and pain.   ACTIVITY LIMITATIONS: carrying, lifting, bending, standing, squatting, stairs, transfers, and locomotion level  PARTICIPATION LIMITATIONS: meal prep, laundry, and community activity  PERSONAL FACTORS: Fitness and 1-2 comorbidities: pulmonary fibrosis, h/o R hip and pelvic fracture  are also affecting patient's functional outcome.   REHAB POTENTIAL: Good  CLINICAL DECISION MAKING: Stable/uncomplicated  EVALUATION COMPLEXITY: Low   GOALS: Goals reviewed with patient? Yes  SHORT TERM GOALS: Target date: 03/09/23 I HEP Baseline: intiated today Goal status: IN PROGRESS  03/23/23 MET  LONG TERM GOALS: Target date: 5/23/242.  1.Improve R ankle Rom for dorsiflexion from 6 to 10 degrees or greater Baseline: 6 Goal status: IN PROGRESS  03/23/23 MET 04/05/23: 10 degrees met so far 04/13/23 MET  2.  Improve unilateral balance to 10 sec or greater on each leg for reduced fall risk Baseline: 3 sec each leg Goal status: IN PROGRESS  03/23/23 04/05/23: goal  progressing so far, 10 sec L leg, 6 R   04/13/23 Left 15 sec. RT 6sec, 04/18/23-SLS addressed in treatment, not re-assessed.  05/04/23 met for Left not RT  3.  Able to complete unilateral heel raise R LE from 6 reps to 15 ore greater Baseline:  Goal status: IN PROGRESS 3 /28/24 04/05/23: 14 reps R, 15 reps L   04/13/23  RT 15 x with pain progressing, 04/26/23-performed B heel raises x 20 reps. Ongoing.  05/04/23 MET  PLAN:  PT FREQUENCY: 1-2x/week  PT DURATION: 12 weeks  PLANNED INTERVENTIONS: Therapeutic exercises, Therapeutic activity, Neuromuscular re-education, Balance training, Gait training, Patient/Family education, Self Care, and Joint mobilization  PLAN FOR NEXT SESSION Discussed D/C at end of cert  period 5/23  Rec MD follow up as we are getting to end of therapy and weakness/pain come and goes Debroah Baller, PTA

## 2023-05-09 NOTE — Telephone Encounter (Signed)
Copied from CRM [id number]. Topic: Medicare AWV >> May 09, 2023 11:09 AM Gwenith Spitz wrote: Reason for CRM: Called patient to schedule Medicare Annual Wellness Visit (AWV). Left message for patient to call back and schedule Medicare Annual Wellness Visit (AWV).  Last date of AWV: 12/03/2020  Please schedule an appointment at any time with Inetta Fermo, [facility]. Please schedule AWVS with Inetta Fermo, [facility].  If any questions, please contact me at [phone].  Thank you ,  Gabriel Cirri [facility] AWV TEAM Direct Dial [phone]

## 2023-05-10 ENCOUNTER — Encounter: Payer: Self-pay | Admitting: Family Medicine

## 2023-05-10 ENCOUNTER — Encounter (INDEPENDENT_AMBULATORY_CARE_PROVIDER_SITE_OTHER): Payer: Medicare Other | Admitting: Ophthalmology

## 2023-05-10 DIAGNOSIS — H353111 Nonexudative age-related macular degeneration, right eye, early dry stage: Secondary | ICD-10-CM | POA: Diagnosis not present

## 2023-05-10 DIAGNOSIS — H2512 Age-related nuclear cataract, left eye: Secondary | ICD-10-CM | POA: Diagnosis not present

## 2023-05-10 DIAGNOSIS — H353221 Exudative age-related macular degeneration, left eye, with active choroidal neovascularization: Secondary | ICD-10-CM

## 2023-05-10 DIAGNOSIS — H43813 Vitreous degeneration, bilateral: Secondary | ICD-10-CM

## 2023-05-11 ENCOUNTER — Ambulatory Visit: Payer: Medicare Other | Admitting: Physical Therapy

## 2023-05-11 ENCOUNTER — Encounter: Payer: Self-pay | Admitting: Physical Therapy

## 2023-05-11 DIAGNOSIS — R262 Difficulty in walking, not elsewhere classified: Secondary | ICD-10-CM

## 2023-05-11 DIAGNOSIS — M79661 Pain in right lower leg: Secondary | ICD-10-CM

## 2023-05-11 DIAGNOSIS — R2689 Other abnormalities of gait and mobility: Secondary | ICD-10-CM

## 2023-05-11 NOTE — Therapy (Signed)
OUTPATIENT PHYSICAL THERAPY LOWER EXTREMITY     Patient Name: Patty [name] MRN: [id number] DOB:07/22/1951, 72 y.o., female Today's Date: 05/11/2023  END OF SESSION:  PT End of Session - 05/11/23 1358     Visit Number 19    Date for PT Re-Evaluation 05/18/23    PT Start Time 1356    PT Stop Time 1436    PT Time Calculation (min) 40 min    Activity Tolerance Patient tolerated treatment well    Behavior During Therapy [facility] for tasks assessed/performed                Past Medical History:  Diagnosis Date   Abnormal nuclear stress test    a. 07/2017: cath with no CAD   Anxiety    Aortic atherosclerosis (HCC)    Arthritis    Chronic diastolic CHF (congestive heart failure) (HCC)    Coronary artery calcification seen on CT scan    Crohn disease (HCC)    DVT (deep venous thrombosis) (HCC)    Dyspnea    Grade I diastolic dysfunction    Hyperlipidemia    ILD (interstitial lung disease) (HCC)    OSA on CPAP    CPAP    AND O2   Pernicious anemia    Polycythemia vera (HCC)    Pulmonary embolism (HCC) 2010   s/p hip surgery , reports this was never truly confirmed    Pulmonary hypertension (HCC)    Sleep disorder 01/10/2020   Uses klonopin for sleep since 2014; sleep walking. And anxiety   Past Surgical History:  Procedure Laterality Date    RIGHT BREAST LUMPECTOMY WITH RADIOACTIVE SEED LOCALIZATION (Right Breast)  02/04/2021   BIOPSY  09/30/2019   Procedure: BIOPSY;  Surgeon: Beverley Fiedler, MD;  Location: [facility];  Service: Gastroenterology;;   BREAST LUMPECTOMY WITH RADIOACTIVE SEED LOCALIZATION Right 02/04/2021   Procedure: RIGHT BREAST LUMPECTOMY WITH RADIOACTIVE SEED LOCALIZATION;  Surgeon: Abigail Miyamoto, MD;  Location: [facility] OR;  Service: General;  Laterality: Right;   CARDIAC CATHETERIZATION     COLON RESECTION  1993   COLONOSCOPY WITH PROPOFOL N/A 09/30/2019   Procedure: COLONOSCOPY WITH PROPOFOL;  Surgeon: Beverley Fiedler, MD;  Location: [facility];   Service: Gastroenterology;  Laterality: N/A;   HIP ARTHROPLASTY Right    x 2, initial right THA, then subsequent right THA revision    LEFT HEART CATH AND CORONARY ANGIOGRAPHY N/A 07/28/2017   Procedure: Left Heart Cath and Coronary Angiography;  Surgeon: Kathleene Hazel, MD;  Location: [facility] INVASIVE CV LAB;  Service: Cardiovascular;  Laterality: N/A;   POLYPECTOMY  09/30/2019   Procedure: POLYPECTOMY;  Surgeon: Beverley Fiedler, MD;  Location: [facility];  Service: Gastroenterology;;   RIGHT HEART CATH N/A 12/07/2020   Procedure: RIGHT HEART CATH;  Surgeon: Tonny Bollman, MD;  Location: [facility] INVASIVE CV LAB;  Service: Cardiovascular;  Laterality: N/A;   Patient Active Problem List   Diagnosis Date Noted   Pulmonary emphysema with fibrosis of lung (HCC) 01/31/2022   Sclerosing adenosis of right breast 02/04/2021   Prediabetes 02/01/2021   Pulmonary hypertension, unspecified (HCC) 12/07/2020   Osteopenia 08/05/2020   Adjustment disorder with anxiety 04/29/2020   Sleep disorder 01/10/2020   Chronic allergic rhinitis 01/10/2020   Pernicious anemia 01/10/2020   Chronic prescription benzodiazepine use 01/10/2020   Benign neoplasm of rectum    Dyslipidemia 09/24/2019   Morbid obesity (HCC) 09/24/2019   History of DVT (deep vein thrombosis), with PE 07/25/2019  OSA on CPAP    Crohn disease (HCC) 06/15/2017   ILD (interstitial lung disease) (HCC) 05/29/2017   Chronic respiratory failure with hypoxia (HCC) 05/29/2017   History of polycythemia vera 11/12/2014    PCP: Asencion Partridge, MD  REFERRING PROVIDER: Delfin Gant MD  REFERRING DIAG: R lower leg pain  THERAPY DIAG:  Pain in right lower leg  Impairment of balance  Difficulty in walking, not elsewhere classified  Rationale for Evaluation and Treatment: Rehabilitation  ONSET DATE: August 2023  SUBJECTIVE:   SUBJECTIVE STATEMENT:   "Good" Pt soreness in her back from doing stuff around the house.  PERTINENT  HISTORY: Had new shoes, reached forward to the ground, shoes were taller, strained my R calf PAIN:  Are you having pain? Just sore in her back  PRECAUTIONS: Other: using on demand O2, checked her own O2 sats throughout eval  WEIGHT BEARING RESTRICTIONS: No  FALLS:  Has patient fallen in last 6 months? No  LIVING ENVIRONMENT: Lives with: lives alone Lives in: House/apartment Stairs: Yes: Internal: 2 steps; can reach both Has following equipment at home: Single point cane2  OCCUPATION: retired  PLOF: Independent  PATIENT GOALS: to be more I, stop pain R leg, avoid falls   NEXT MD VISIT: 05/25/23  OBJECTIVE:   DIAGNOSTIC FINDINGS: na, patient reports x rays - R ankle  PATIENT SURVEYS:  LEFS 30/80  62.5% disability COGNITION: Overall cognitive status: Within functional limits for tasks assessed     SENSATION: WFL  EDEMA:  None noted  MUSCLE LENGTH: Hamstrings: Right wfl deg; Left wfl deg  POSTURE:  R hip ER in standing, overpronates B ankles,with gait, greater R than L R medial gastrocs atrophied compared to L  PALPATION: Tender , thickened R medial gastrocs muscle belly, non tender post knee tender R fibular head  LOWER EXTREMITY ROM:  Passive ROM Right eval Left eval RT 03/21/23 04/05/23 R 04/13/23 RT  Hip flexion 90      Hip extension       Hip abduction       Hip adduction       Hip internal rotation       Hip external rotation       Knee flexion       Knee extension       Ankle dorsiflexion 6 12 12 10 15   Ankle plantarflexion wfl wfl     Ankle inversion Wfl wfl     Ankle eversion Wfl wfl      (Blank rows = not tested)  LOWER EXTREMITY MMT:MMT B LE's equal and wnl.  Single leg heel raises 15 reps L, 13 reps R each FUNCTIONAL TESTS:  Single leg stance R and L 3 sec each  GAIT: Distance walked: 59' within department, rested and self monitored her pulse oxygen, dropped to 88 x 2 todaybut rebounded Assistive device utilized:  supplemental on demand  O2 Level of assistance: Complete Independence Comments: na  TODAY'S TREATMENT:  DATE:  05/11/23 NuStep L5 x 10 minutes Step ups onto 4" step, forward, side step, crossover, 10 reps each Seated HS curls 35#, 2 x 15 Seated knee ext 20#, 2 x 10 reps  05/09/23 NuStep L5 x 8 min Gait 5 laps w/ 3L O2 tank on back 4 in step ups box on airex x10 each S2S LE on airex x10, holding yellow ball  Leg Press 40# 2 sets 10, SL 20# 2x10 each Heel raises black bar 2x10   05/04/23 Nustep L 5 8 min Leg Press 40# 2 sets 10, SL 20# 10 each Leg Press Calf raises 40# 3 sets 10 PROM and STW to LE, reviewed stretches at home S2S holding yellow ball with O2 on back 2x10 on airex HS curl 35# 2 sets 15 Knee ext 25 # 3 sets 10  05/02/23 NuStep L5 x 8 min HS curl 35# 2 sets 15 Knee ext 25 # 3 sets 10 Gait 3 laps w/ 3L O2 x2 seated rest in between  S2S holding yellow ball with O2 on back 2x10 Step ups 6 in x5, x10 each  On airex ball toss  04/26/23 NuStep L5 x 8 minutes STM to R medial calf, medial HS, ITB F/B stretch to same muscles Strengthening to calves- Heel raises on air ex plank, 2 x 10 Modified dead lift with 4#, target placed 24" off floor, 2 x 10 HS curl 35# 2 sets 15 Knee ext 25 # 3 sets 10  04/20/23 Nustep L 5 8 min HS curl 35# 2 sets 15 Knee ext 25 # 3 sets 10 In // bars 3# ankle wts on airex marching, SL hip flex,ext and abd 20 x In //bars on airex 6 inch alt step tap 20 x 2 sets In // bars on airex stepping on and off 6 inch fwd and laterally 10 x each leg In // bars dyna disc BIL ankle ROM 4 way 15 x each   04/18/23 Pedalled bike x 4 min, then 3 x 15 sec fast pedals for NM re-ed and coordination, ST assessment- R lower leg TTP ITB insertion, Med/prox calf Side stepping on black floor mat in each direction, then alternately tapping orange cones while stanidng on  black floor mat with side stepping. Seated ankle strengthening- ball squeeze 10 x 5 sec holds, B ankle eversion against Red Tband resistance, DF against R tband, 2 x 10 each. Stand on step, calf stretch, both gastroc, then soleus Standing hip abd against Red Tband at knees. BUE support on counter, 1 x 10 then 1 x 5 each, VC for posture and technique.  04/13/23 Assessed and documented goals Assessed and documented DF Nustep L 5 Preformed and Issued HEP on airex as pt purchased one -toe raises, heel raises, SLS, marching and Red tband SL kicks 3 way, tandem stance Calf raises on steps 4 inch 15- issued for HEP 4 Inch step up ups 10 x fwd and laterally BIL- issued for HEP HS curl 35# 2 sets 15 Knee ext 25 # 3 sets 10 Resisted gait 4 way 4 x each 30# DN RT lateral LE more distal than last session by MAlbrightPT  04/11/23  In // bars on foam beam tandem fwd and back, side stepping, heel raises, toe raises, marching In // bars on sit fit BIL squats 2 sets 10 BOSU step  10 x BIL fwd and laterally in // bars  Standing on airex Red TBand kicks 3 way 15 x BIL Educ on possibly  getting airex for home  and versatility of ex for balance and strength with emphasis on small muscles HS curl 35# 2 sets 15 Knee ext 25 # 3 sets 10 DN RT lateral LE by MAlbrightPT  04/05/23:  HS curl 35# 2 sets 15 Knee ext 25 # 3 sets 10 Standing on airex, toe raises at counter Standing on airex tandem, with vertical head movements and rotation Leg press: 30#, 3 sets 15 reps Black bar, heel raises, cues for upright posture.  Sit to stand from mat table, with 2 # mediball and feet on airex, 5 reps.  04/04/23 STW to BIL calfs and stretching to calfs and ankles DN by Octavio Graves PT STS on airex 10 x without UE On airex heel raises/toe raise 10 x. HHA alt LE marching 20 x HS curl 35# 2 sets 15 Knee ext 25 # 3 sets 10 Black Bar Heel raise /toe raise 20x each  nd calf  PATIENT EDUCATION:  Education details:  POC Person educated: Patient Education method: Programmer, multimedia, Facilities manager, Verbal cues, and Handouts Education comprehension: verbalized understanding, returned demonstration, and verbal cues required  HOME EXERCISE PROGRAM: Access Code: [email or web address] URL: [email or web address] Date: 02/23/2023 Prepared by: Linton Rump Speaks  Exercises - Seated Calf Stretch with Strap  - 3 x daily - 7 x weekly - 1 sets - 3 reps - 30 hold - Long Sitting Ankle Plantar Flexion with Resistance  - 1 x daily - 7 x weekly - 2 sets - 10 reps  ASSESSMENT:  CLINICAL IMPRESSION Pt reports that she feels better overall. She reports that her calf is not as tender, she is using massage gun. She agrees that she is approaching a level at which she can take over, but will benefit from a couple more visits in order to educate her on home exercises with a physioball. She tolerated BLE strengthening activities with occasional rest breaks for SOB, no C/O pain.   OBJECTIVE IMPAIRMENTS: cardiopulmonary status limiting activity, decreased activity tolerance, decreased balance, decreased endurance, difficulty walking, decreased ROM, decreased strength, hypomobility, and pain.   ACTIVITY LIMITATIONS: carrying, lifting, bending, standing, squatting, stairs, transfers, and locomotion level  PARTICIPATION LIMITATIONS: meal prep, laundry, and community activity  PERSONAL FACTORS: Fitness and 1-2 comorbidities: pulmonary fibrosis, h/o R hip and pelvic fracture  are also affecting patient's functional outcome.   REHAB POTENTIAL: Good  CLINICAL DECISION MAKING: Stable/uncomplicated  EVALUATION COMPLEXITY: Low   GOALS: Goals reviewed with patient? Yes  SHORT TERM GOALS: Target date: 03/09/23 I HEP Baseline: intiated today Goal status: IN PROGRESS  03/23/23 MET  LONG TERM GOALS: Target date: 5/23/242.  1.Improve R ankle Rom for dorsiflexion from 6 to 10 degrees or greater Baseline: 6 Goal status: IN PROGRESS  03/23/23 MET  04/05/23: 10 degrees met so far 04/13/23 MET  2.  Improve unilateral balance to 10 sec or greater on each leg for reduced fall risk Baseline: 3 sec each leg Goal status: IN PROGRESS  03/23/23 04/05/23: goal progressing so far, 10 sec L leg, 6 R   04/13/23 Left 15 sec. RT 6sec, 04/18/23-SLS addressed in treatment, not re-assessed.  05/04/23 met for Left not RT  3.  Able to complete unilateral heel raise R LE from 6 reps to 15 ore greater Baseline:  Goal status: IN PROGRESS 3 /28/24 04/05/23: 14 reps R, 15 reps L   04/13/23  RT 15 x with pain progressing, 04/26/23-performed B heel raises x 20 reps. Ongoing.  05/04/23 MET  PLAN:  PT FREQUENCY: 1-2x/week  PT DURATION: 12 weeks  PLANNED INTERVENTIONS: Therapeutic exercises, Therapeutic activity, Neuromuscular re-education, Balance training, Gait training, Patient/Family education, Self Care, and Joint mobilization  PLAN FOR NEXT SESSION Discussed D/C at end of cert period 9/56 05/11/23-Review physioball exercises patient can do at home.   Rec MD follow up as we are getting to end of therapy and weakness/pain come and goes  Hawley Frymyer DPT 05/11/23 2:36 PM

## 2023-05-16 ENCOUNTER — Ambulatory Visit: Payer: Medicare Other | Admitting: Physical Therapy

## 2023-05-16 DIAGNOSIS — M79661 Pain in right lower leg: Secondary | ICD-10-CM

## 2023-05-16 DIAGNOSIS — R262 Difficulty in walking, not elsewhere classified: Secondary | ICD-10-CM | POA: Diagnosis not present

## 2023-05-16 DIAGNOSIS — R2689 Other abnormalities of gait and mobility: Secondary | ICD-10-CM

## 2023-05-16 NOTE — Therapy (Signed)
OUTPATIENT PHYSICAL THERAPY LOWER EXTREMITY     Patient Name: Patty [name] MRN: [id number] DOB:[birth year], 72 y.o., female Today's Date: 05/16/2023  END OF SESSION:  PT End of Session - 05/16/23 1402     Visit Number 20    Date for PT Re-Evaluation 05/18/23    PT Start Time 1400    PT Stop Time 1445    PT Time Calculation (min) 45 min                Past Medical History:  Diagnosis Date   Abnormal nuclear stress test    a. 07/2017: cath with no CAD   Anxiety    Aortic atherosclerosis (HCC)    Arthritis    Chronic diastolic CHF (congestive heart failure) (HCC)    Coronary artery calcification seen on CT scan    Crohn disease (HCC)    DVT (deep venous thrombosis) (HCC)    Dyspnea    Grade I diastolic dysfunction    Hyperlipidemia    ILD (interstitial lung disease) (HCC)    OSA on CPAP    CPAP    AND O2   Pernicious anemia    Polycythemia vera (HCC)    Pulmonary embolism (HCC) 2010   s/p hip surgery , reports this was never truly confirmed    Pulmonary hypertension (HCC)    Sleep disorder 01/10/2020   Uses klonopin for sleep since 2014; sleep walking. And anxiety   Past Surgical History:  Procedure Laterality Date    RIGHT BREAST LUMPECTOMY WITH RADIOACTIVE SEED LOCALIZATION (Right Breast)  02/04/2021   BIOPSY  09/30/2019   Procedure: BIOPSY;  Surgeon: Beverley Fiedler, MD;  Location: [facility];  Service: Gastroenterology;;   BREAST LUMPECTOMY WITH RADIOACTIVE SEED LOCALIZATION Right 02/04/2021   Procedure: RIGHT BREAST LUMPECTOMY WITH RADIOACTIVE SEED LOCALIZATION;  Surgeon: Abigail Miyamoto, MD;  Location: [facility] OR;  Service: General;  Laterality: Right;   CARDIAC CATHETERIZATION     COLON RESECTION  1993   COLONOSCOPY WITH PROPOFOL N/A 09/30/2019   Procedure: COLONOSCOPY WITH PROPOFOL;  Surgeon: Beverley Fiedler, MD;  Location: [facility];  Service: Gastroenterology;  Laterality: N/A;   HIP ARTHROPLASTY Right    x 2, initial right THA, then subsequent  right THA revision    LEFT HEART CATH AND CORONARY ANGIOGRAPHY N/A 07/28/2017   Procedure: Left Heart Cath and Coronary Angiography;  Surgeon: Kathleene Hazel, MD;  Location: [facility] INVASIVE CV LAB;  Service: Cardiovascular;  Laterality: N/A;   POLYPECTOMY  09/30/2019   Procedure: POLYPECTOMY;  Surgeon: Beverley Fiedler, MD;  Location: [facility];  Service: Gastroenterology;;   RIGHT HEART CATH N/A 12/07/2020   Procedure: RIGHT HEART CATH;  Surgeon: Tonny Bollman, MD;  Location: [facility] INVASIVE CV LAB;  Service: Cardiovascular;  Laterality: N/A;   Patient Active Problem List   Diagnosis Date Noted   Pulmonary emphysema with fibrosis of lung (HCC) 01/31/2022   Sclerosing adenosis of right breast 02/04/2021   Prediabetes 02/01/2021   Pulmonary hypertension, unspecified (HCC) 12/07/2020   Osteopenia 08/05/2020   Adjustment disorder with anxiety 04/29/2020   Sleep disorder 01/10/2020   Chronic allergic rhinitis 01/10/2020   Pernicious anemia 01/10/2020   Chronic prescription benzodiazepine use 01/10/2020   Benign neoplasm of rectum    Dyslipidemia 09/24/2019   Morbid obesity (HCC) 09/24/2019   History of DVT (deep vein thrombosis), with PE 07/25/2019   OSA on CPAP    Crohn disease (HCC) 06/15/2017   ILD (interstitial lung disease) (HCC) 05/29/2017  Chronic respiratory failure with hypoxia (HCC) 05/29/2017   History of polycythemia vera 11/12/2014    PCP: Asencion Partridge, MD  REFERRING PROVIDER: Delfin Gant MD  REFERRING DIAG: R lower leg pain  THERAPY DIAG:  Pain in right lower leg  Impairment of balance  Difficulty in walking, not elsewhere classified  Rationale for Evaluation and Treatment: Rehabilitation  ONSET DATE: August 2023  SUBJECTIVE:   SUBJECTIVE STATEMENT:   Random floating pains upper back, shld ,leg. Have not been back to gym but incorporating in all HEPS.   PERTINENT HISTORY: Had new shoes, reached forward to the ground, shoes were taller, strained my  R calf PAIN:  Are you having pain? Just sore in her back  PRECAUTIONS: Other: using on demand O2, checked her own O2 sats throughout eval  WEIGHT BEARING RESTRICTIONS: No  FALLS:  Has patient fallen in last 6 months? No  LIVING ENVIRONMENT: Lives with: lives alone Lives in: House/apartment Stairs: Yes: Internal: 2 steps; can reach both Has following equipment at home: Single point cane2  OCCUPATION: retired  PLOF: Independent  PATIENT GOALS: to be more I, stop pain R leg, avoid falls   NEXT MD VISIT: 05/25/23  OBJECTIVE:   DIAGNOSTIC FINDINGS: na, patient reports x rays - R ankle  PATIENT SURVEYS:  LEFS 30/80  62.5% disability COGNITION: Overall cognitive status: Within functional limits for tasks assessed     SENSATION: WFL  EDEMA:  None noted  MUSCLE LENGTH: Hamstrings: Right wfl deg; Left wfl deg  POSTURE:  R hip ER in standing, overpronates B ankles,with gait, greater R than L R medial gastrocs atrophied compared to L  PALPATION: Tender , thickened R medial gastrocs muscle belly, non tender post knee tender R fibular head  LOWER EXTREMITY ROM:  Passive ROM Right eval Left eval RT 03/21/23 04/05/23 R 04/13/23 RT  Hip flexion 90      Hip extension       Hip abduction       Hip adduction       Hip internal rotation       Hip external rotation       Knee flexion       Knee extension       Ankle dorsiflexion 6 12 12 10 15   Ankle plantarflexion wfl wfl     Ankle inversion Wfl wfl     Ankle eversion Wfl wfl      (Blank rows = not tested)  LOWER EXTREMITY MMT:MMT B LE's equal and wnl.  Single leg heel raises 15 reps L, 13 reps R each FUNCTIONAL TESTS:  Single leg stance R and L 3 sec each  GAIT: Distance walked: 36' within department, rested and self monitored her pulse oxygen, dropped to 88 x 2 todaybut rebounded Assistive device utilized:  supplemental on demand O2 Level of assistance: Complete Independence Comments: na  TODAY'S TREATMENT:  DATE:   05/16/23 Nustep L 5 Seated HS curls 35#, 2 x 15 Seated knee ext 20#, 3 x 10 reps STS wt ball chest press 10x STS wt ball OH press 10 x Cable pull shld ext and row on airex 2 sets 10 each Step ups onto 6" step, forward, side step, crossover, 10 reps each      05/11/23 NuStep L5 x 10 minutes Step ups onto 4" step, forward, side step, crossover, 10 reps each Seated HS curls 35#, 2 x 15 Seated knee ext 20#, 2 x 10 reps  05/09/23 NuStep L5 x 8 min Gait 5 laps w/ 3L O2 tank on back 4 in step ups box on airex x10 each S2S LE on airex x10, holding yellow ball  Leg Press 40# 2 sets 10, SL 20# 2x10 each Heel raises black bar 2x10   05/04/23 Nustep L 5 8 min Leg Press 40# 2 sets 10, SL 20# 10 each Leg Press Calf raises 40# 3 sets 10 PROM and STW to LE, reviewed stretches at home S2S holding yellow ball with O2 on back 2x10 on airex HS curl 35# 2 sets 15 Knee ext 25 # 3 sets 10  05/02/23 NuStep L5 x 8 min HS curl 35# 2 sets 15 Knee ext 25 # 3 sets 10 Gait 3 laps w/ 3L O2 x2 seated rest in between  S2S holding yellow ball with O2 on back 2x10 Step ups 6 in x5, x10 each  On airex ball toss  04/26/23 NuStep L5 x 8 minutes STM to R medial calf, medial HS, ITB F/B stretch to same muscles Strengthening to calves- Heel raises on air ex plank, 2 x 10 Modified dead lift with 4#, target placed 24" off floor, 2 x 10 HS curl 35# 2 sets 15 Knee ext 25 # 3 sets 10  04/20/23 Nustep L 5 8 min HS curl 35# 2 sets 15 Knee ext 25 # 3 sets 10 In // bars 3# ankle wts on airex marching, SL hip flex,ext and abd 20 x In //bars on airex 6 inch alt step tap 20 x 2 sets In // bars on airex stepping on and off 6 inch fwd and laterally 10 x each leg In // bars dyna disc BIL ankle ROM 4 way 15 x each   04/18/23 Pedalled bike x 4 min, then 3 x 15 sec fast pedals for NM re-ed  and coordination, ST assessment- R lower leg TTP ITB insertion, Med/prox calf Side stepping on black floor mat in each direction, then alternately tapping orange cones while stanidng on black floor mat with side stepping. Seated ankle strengthening- ball squeeze 10 x 5 sec holds, B ankle eversion against Red Tband resistance, DF against R tband, 2 x 10 each. Stand on step, calf stretch, both gastroc, then soleus Standing hip abd against Red Tband at knees. BUE support on counter, 1 x 10 then 1 x 5 each, VC for posture and technique.  04/13/23 Assessed and documented goals Assessed and documented DF Nustep L 5 Preformed and Issued HEP on airex as pt purchased one -toe raises, heel raises, SLS, marching and Red tband SL kicks 3 way, tandem stance Calf raises on steps 4 inch 15- issued for HEP 4 Inch step up ups 10 x fwd and laterally BIL- issued for HEP HS curl 35# 2 sets 15 Knee ext 25 # 3 sets 10 Resisted gait 4 way 4 x each 30# DN RT lateral LE more distal  than last session by MAlbrightPT  04/11/23  In // bars on foam beam tandem fwd and back, side stepping, heel raises, toe raises, marching In // bars on sit fit BIL squats 2 sets 10 BOSU step  10 x BIL fwd and laterally in // bars  Standing on airex Red TBand kicks 3 way 15 x BIL Educ on possibly getting airex for home  and versatility of ex for balance and strength with emphasis on small muscles HS curl 35# 2 sets 15 Knee ext 25 # 3 sets 10 DN RT lateral LE by MAlbrightPT  04/05/23:  HS curl 35# 2 sets 15 Knee ext 25 # 3 sets 10 Standing on airex, toe raises at counter Standing on airex tandem, with vertical head movements and rotation Leg press: 30#, 3 sets 15 reps Black bar, heel raises, cues for upright posture.  Sit to stand from mat table, with 2 # mediball and feet on airex, 5 reps.  04/04/23 STW to BIL calfs and stretching to calfs and ankles DN by Octavio Graves PT STS on airex 10 x without UE On airex heel  raises/toe raise 10 x. HHA alt LE marching 20 x HS curl 35# 2 sets 15 Knee ext 25 # 3 sets 10 Black Bar Heel raise /toe raise 20x each  nd calf  PATIENT EDUCATION:  Education details: POC Person educated: Patient Education method: Programmer, multimedia, Facilities manager, Verbal cues, and Handouts Education comprehension: verbalized understanding, returned demonstration, and verbal cues required  HOME EXERCISE PROGRAM: Access Code: [email or web address] URL: [email or web address] Date: 02/23/2023 Prepared by: Linton Rump Speaks  Exercises - Seated Calf Stretch with Strap  - 3 x daily - 7 x weekly - 1 sets - 3 reps - 30 hold - Long Sitting Ankle Plantar Flexion with Resistance  - 1 x daily - 7 x weekly - 2 sets - 10 reps  ASSESSMENT:  CLINICAL IMPRESSION Pt with mutli c/o pains that come and go.Tenderness BIL RT > LEft medial diatal knee area. Pt has multi HEPS for strength and balance as well as does go to gym but has not returned. Pt states she is calling for MD appt for random pains, comes and goes weakness, shoe orthotic. Pt states doing more at home. Rec D/C next session,continue ex and f/u with MD  OBJECTIVE IMPAIRMENTS: cardiopulmonary status limiting activity, decreased activity tolerance, decreased balance, decreased endurance, difficulty walking, decreased ROM, decreased strength, hypomobility, and pain.   ACTIVITY LIMITATIONS: carrying, lifting, bending, standing, squatting, stairs, transfers, and locomotion level  PARTICIPATION LIMITATIONS: meal prep, laundry, and community activity  PERSONAL FACTORS: Fitness and 1-2 comorbidities: pulmonary fibrosis, h/o R hip and pelvic fracture  are also affecting patient's functional outcome.   REHAB POTENTIAL: Good  CLINICAL DECISION MAKING: Stable/uncomplicated  EVALUATION COMPLEXITY: Low   GOALS: Goals reviewed with patient? Yes  SHORT TERM GOALS: Target date: 03/09/23 I HEP Baseline: intiated today Goal status: IN PROGRESS  03/23/23  MET  LONG TERM GOALS: Target date: 5/23/242.  1.Improve R ankle Rom for dorsiflexion from 6 to 10 degrees or greater Baseline: 6 Goal status: IN PROGRESS  03/23/23 MET 04/05/23: 10 degrees met so far 04/13/23 MET  2.  Improve unilateral balance to 10 sec or greater on each leg for reduced fall risk Baseline: 3 sec each leg Goal status: IN PROGRESS  03/23/23 04/05/23: goal progressing so far, 10 sec L leg, 6 R   04/13/23 Left 15 sec. RT 6sec, 04/18/23-SLS addressed in treatment, not re-assessed.  05/04/23 met for Left not  RT  3.  Able to complete unilateral heel raise R LE from 6 reps to 15 ore greater Baseline:  Goal status: IN PROGRESS 3 /28/24 04/05/23: 14 reps R, 15 reps L   04/13/23  RT 15 x with pain progressing, 04/26/23-performed B heel raises x 20 reps. Ongoing.  05/04/23 MET  PLAN:  PT FREQUENCY: 1-2x/week  PT DURATION: 12 weeks  PLANNED INTERVENTIONS: Therapeutic exercises, Therapeutic activity, Neuromuscular re-education, Balance training, Gait training, Patient/Family education, Self Care, and Joint mobilization  PLAN FOR NEXT SESSION Discussed D/C at end of cert period 1/61 Rec MD follow up as we are getting to end of therapy and weakness/pain come and goes. D/C next visit so 20th visit not done today ( no active FOTO)    Antonyo Hinderer,ANGIE, PTA 05/16/2023, 2:03 PM  [facility] at [facility] [address] [facility]. [place], [place], [address] Phone: [phone]   Fax:  [phone]

## 2023-05-18 ENCOUNTER — Encounter: Payer: Self-pay | Admitting: Physical Therapy

## 2023-05-18 ENCOUNTER — Ambulatory Visit: Payer: Medicare Other | Admitting: Physical Therapy

## 2023-05-18 DIAGNOSIS — M79661 Pain in right lower leg: Secondary | ICD-10-CM | POA: Diagnosis not present

## 2023-05-18 DIAGNOSIS — R2689 Other abnormalities of gait and mobility: Secondary | ICD-10-CM | POA: Diagnosis not present

## 2023-05-18 DIAGNOSIS — R262 Difficulty in walking, not elsewhere classified: Secondary | ICD-10-CM

## 2023-05-18 NOTE — Therapy (Signed)
OUTPATIENT PHYSICAL THERAPY LOWER EXTREMITY  PHYSICAL THERAPY DISCHARGE SUMMARY  Visits from Start of Care: 21  Current functional level related to goals / functional outcomes: Goals partially met   Remaining deficits: R LE weakness   Education / Equipment: HEP   Patient agrees to discharge. Patient goals were partially met. Patient is being discharged due to maximized rehab potential.      Patient Name: Patty [name] MRN: [id number] DOB:1951-11-08, 72 y.o., female Today's Date: 05/18/2023  END OF SESSION:  PT End of Session - 05/18/23 1406     Visit Number 21    Date for PT Re-Evaluation 05/18/23    PT Start Time 1403    PT Stop Time 1435    PT Time Calculation (min) 32 min    Activity Tolerance Patient tolerated treatment well    Behavior During Therapy [facility] for tasks assessed/performed                Past Medical History:  Diagnosis Date   Abnormal nuclear stress test    a. 07/2017: cath with no CAD   Anxiety    Aortic atherosclerosis (HCC)    Arthritis    Chronic diastolic CHF (congestive heart failure) (HCC)    Coronary artery calcification seen on CT scan    Crohn disease (HCC)    DVT (deep venous thrombosis) (HCC)    Dyspnea    Grade I diastolic dysfunction    Hyperlipidemia    ILD (interstitial lung disease) (HCC)    OSA on CPAP    CPAP    AND O2   Pernicious anemia    Polycythemia vera (HCC)    Pulmonary embolism (HCC) 2010   s/p hip surgery , reports this was never truly confirmed    Pulmonary hypertension (HCC)    Sleep disorder 01/10/2020   Uses klonopin for sleep since 2014; sleep walking. And anxiety   Past Surgical History:  Procedure Laterality Date    RIGHT BREAST LUMPECTOMY WITH RADIOACTIVE SEED LOCALIZATION (Right Breast)  02/04/2021   BIOPSY  09/30/2019   Procedure: BIOPSY;  Surgeon: Beverley Fiedler, MD;  Location: [facility];  Service: Gastroenterology;;   BREAST LUMPECTOMY WITH RADIOACTIVE SEED LOCALIZATION Right 02/04/2021    Procedure: RIGHT BREAST LUMPECTOMY WITH RADIOACTIVE SEED LOCALIZATION;  Surgeon: Abigail Miyamoto, MD;  Location: [facility] OR;  Service: General;  Laterality: Right;   CARDIAC CATHETERIZATION     COLON RESECTION  1993   COLONOSCOPY WITH PROPOFOL N/A 09/30/2019   Procedure: COLONOSCOPY WITH PROPOFOL;  Surgeon: Beverley Fiedler, MD;  Location: [facility];  Service: Gastroenterology;  Laterality: N/A;   HIP ARTHROPLASTY Right    x 2, initial right THA, then subsequent right THA revision    LEFT HEART CATH AND CORONARY ANGIOGRAPHY N/A 07/28/2017   Procedure: Left Heart Cath and Coronary Angiography;  Surgeon: Kathleene Hazel, MD;  Location: [facility] INVASIVE CV LAB;  Service: Cardiovascular;  Laterality: N/A;   POLYPECTOMY  09/30/2019   Procedure: POLYPECTOMY;  Surgeon: Beverley Fiedler, MD;  Location: [facility];  Service: Gastroenterology;;   RIGHT HEART CATH N/A 12/07/2020   Procedure: RIGHT HEART CATH;  Surgeon: Tonny Bollman, MD;  Location: [facility] INVASIVE CV LAB;  Service: Cardiovascular;  Laterality: N/A;   Patient Active Problem List   Diagnosis Date Noted   Pulmonary emphysema with fibrosis of lung (HCC) 01/31/2022   Sclerosing adenosis of right breast 02/04/2021   Prediabetes 02/01/2021   Pulmonary hypertension, unspecified (HCC) 12/07/2020   Osteopenia 08/05/2020  Adjustment disorder with anxiety 04/29/2020   Sleep disorder 01/10/2020   Chronic allergic rhinitis 01/10/2020   Pernicious anemia 01/10/2020   Chronic prescription benzodiazepine use 01/10/2020   Benign neoplasm of rectum    Dyslipidemia 09/24/2019   Morbid obesity (HCC) 09/24/2019   History of DVT (deep vein thrombosis), with PE 07/25/2019   OSA on CPAP    Crohn disease (HCC) 06/15/2017   ILD (interstitial lung disease) (HCC) 05/29/2017   Chronic respiratory failure with hypoxia (HCC) 05/29/2017   History of polycythemia vera 11/12/2014    PCP: Asencion Partridge, MD  REFERRING PROVIDER: Delfin Gant MD  REFERRING  DIAG: R lower leg pain  THERAPY DIAG:  Pain in right lower leg  Impairment of balance  Difficulty in walking, not elsewhere classified  Rationale for Evaluation and Treatment: Rehabilitation  ONSET DATE: August 2023  SUBJECTIVE:   SUBJECTIVE STATEMENT:   Random floating pains upper back, shld ,leg. Have not been back to gym but incorporating in all HEPS.   PERTINENT HISTORY: Had new shoes, reached forward to the ground, shoes were taller, strained my R calf PAIN:  Are you having pain? Just sore in her back  PRECAUTIONS: Other: using on demand O2, checked her own O2 sats throughout eval  WEIGHT BEARING RESTRICTIONS: No  FALLS:  Has patient fallen in last 6 months? No  LIVING ENVIRONMENT: Lives with: lives alone Lives in: House/apartment Stairs: Yes: Internal: 2 steps; can reach both Has following equipment at home: Single point cane2  OCCUPATION: retired  PLOF: Independent  PATIENT GOALS: to be more I, stop pain R leg, avoid falls   NEXT MD VISIT: 05/25/23  OBJECTIVE:   DIAGNOSTIC FINDINGS: na, patient reports x rays - R ankle  PATIENT SURVEYS:  LEFS 30/80  62.5% disability COGNITION: Overall cognitive status: Within functional limits for tasks assessed     SENSATION: WFL  EDEMA:  None noted  MUSCLE LENGTH: Hamstrings: Right wfl deg; Left wfl deg  POSTURE:  R hip ER in standing, overpronates B ankles,with gait, greater R than L R medial gastrocs atrophied compared to L  PALPATION: Tender , thickened R medial gastrocs muscle belly, non tender post knee tender R fibular head  LOWER EXTREMITY ROM:  Passive ROM Right eval Left eval RT 03/21/23 04/05/23 R 04/13/23 RT  Hip flexion 90      Hip extension       Hip abduction       Hip adduction       Hip internal rotation       Hip external rotation       Knee flexion       Knee extension       Ankle dorsiflexion 6 12 12 10 15   Ankle plantarflexion wfl wfl     Ankle inversion Wfl wfl     Ankle  eversion Wfl wfl      (Blank rows = not tested)  LOWER EXTREMITY MMT:MMT B LE's equal and wnl.  Single leg heel raises 15 reps L, 13 reps R each FUNCTIONAL TESTS:  Single leg stance R and L 3 sec each  GAIT: Distance walked: 50' within department, rested and self monitored her pulse oxygen, dropped to 88 x 2 todaybut rebounded Assistive device utilized:  supplemental on demand O2 Level of assistance: Complete Independence Comments: na  TODAY'S TREATMENT:  DATE:  05/18/23 NuStep L x 8 minutes Goals re-assessed for D/C. Supine stretch with legs on physioball-figure 4, DKTC, LTR Supine strength over physioball- bridge, bridge with B hip IR, crunches  05/16/23 Nustep L 5 Seated HS curls 35#, 2 x 15 Seated knee ext 20#, 3 x 10 reps STS wt ball chest press 10x STS wt ball OH press 10 x Cable pull shld ext and row on airex 2 sets 10 each Step ups onto 6" step, forward, side step, crossover, 10 reps each  05/11/23 NuStep L5 x 10 minutes Step ups onto 4" step, forward, side step, crossover, 10 reps each Seated HS curls 35#, 2 x 15 Seated knee ext 20#, 2 x 10 reps  05/09/23 NuStep L5 x 8 min Gait 5 laps w/ 3L O2 tank on back 4 in step ups box on airex x10 each S2S LE on airex x10, holding yellow ball  Leg Press 40# 2 sets 10, SL 20# 2x10 each Heel raises black bar 2x10   05/04/23 Nustep L 5 8 min Leg Press 40# 2 sets 10, SL 20# 10 each Leg Press Calf raises 40# 3 sets 10 PROM and STW to LE, reviewed stretches at home S2S holding yellow ball with O2 on back 2x10 on airex HS curl 35# 2 sets 15 Knee ext 25 # 3 sets 10  05/02/23 NuStep L5 x 8 min HS curl 35# 2 sets 15 Knee ext 25 # 3 sets 10 Gait 3 laps w/ 3L O2 x2 seated rest in between  S2S holding yellow ball with O2 on back 2x10 Step ups 6 in x5, x10 each  On airex ball toss  04/26/23 NuStep L5 x  8 minutes STM to R medial calf, medial HS, ITB F/B stretch to same muscles Strengthening to calves- Heel raises on air ex plank, 2 x 10 Modified dead lift with 4#, target placed 24" off floor, 2 x 10 HS curl 35# 2 sets 15 Knee ext 25 # 3 sets 10  04/20/23 Nustep L 5 8 min HS curl 35# 2 sets 15 Knee ext 25 # 3 sets 10 In // bars 3# ankle wts on airex marching, SL hip flex,ext and abd 20 x In //bars on airex 6 inch alt step tap 20 x 2 sets In // bars on airex stepping on and off 6 inch fwd and laterally 10 x each leg In // bars dyna disc BIL ankle ROM 4 way 15 x each   04/18/23 Pedalled bike x 4 min, then 3 x 15 sec fast pedals for NM re-ed and coordination, ST assessment- R lower leg TTP ITB insertion, Med/prox calf Side stepping on black floor mat in each direction, then alternately tapping orange cones while stanidng on black floor mat with side stepping. Seated ankle strengthening- ball squeeze 10 x 5 sec holds, B ankle eversion against Red Tband resistance, DF against R tband, 2 x 10 each. Stand on step, calf stretch, both gastroc, then soleus Standing hip abd against Red Tband at knees. BUE support on counter, 1 x 10 then 1 x 5 each, VC for posture and technique.  04/13/23 Assessed and documented goals Assessed and documented DF Nustep L 5 Preformed and Issued HEP on airex as pt purchased one -toe raises, heel raises, SLS, marching and Red tband SL kicks 3 way, tandem stance Calf raises on steps 4 inch 15- issued for HEP 4 Inch step up ups 10 x fwd and laterally BIL- issued for HEP HS  curl 35# 2 sets 15 Knee ext 25 # 3 sets 10 Resisted gait 4 way 4 x each 30# DN RT lateral LE more distal than last session by MAlbrightPT  04/11/23  In // bars on foam beam tandem fwd and back, side stepping, heel raises, toe raises, marching In // bars on sit fit BIL squats 2 sets 10 BOSU step  10 x BIL fwd and laterally in // bars  Standing on airex Red TBand kicks 3 way 15 x BIL Educ on  possibly getting airex for home  and versatility of ex for balance and strength with emphasis on small muscles HS curl 35# 2 sets 15 Knee ext 25 # 3 sets 10 DN RT lateral LE by MAlbrightPT  04/05/23:  HS curl 35# 2 sets 15 Knee ext 25 # 3 sets 10 Standing on airex, toe raises at counter Standing on airex tandem, with vertical head movements and rotation Leg press: 30#, 3 sets 15 reps Black bar, heel raises, cues for upright posture.  Sit to stand from mat table, with 2 # mediball and feet on airex, 5 reps.  04/04/23 STW to BIL calfs and stretching to calfs and ankles DN by Octavio Graves PT STS on airex 10 x without UE On airex heel raises/toe raise 10 x. HHA alt LE marching 20 x HS curl 35# 2 sets 15 Knee ext 25 # 3 sets 10 Black Bar Heel raise /toe raise 20x each  nd calf  PATIENT EDUCATION:  Education details: POC Person educated: Patient Education method: Programmer, multimedia, Facilities manager, Verbal cues, and Handouts Education comprehension: verbalized understanding, returned demonstration, and verbal cues required  HOME EXERCISE PROGRAM: Access Code: [email or web address] URL: [email or web address] Date: 02/23/2023 Prepared by: Linton Rump Speaks  Exercises - Seated Calf Stretch with Strap  - 3 x daily - 7 x weekly - 1 sets - 3 reps - 30 hold - Long Sitting Ankle Plantar Flexion with Resistance  - 1 x daily - 7 x weekly - 2 sets - 10 reps  ASSESSMENT:  CLINICAL IMPRESSION Patient's status re-assessed for D/C. She met most of her goals, still unable to stand on RLE in SLS > 4 sec  OBJECTIVE IMPAIRMENTS: cardiopulmonary status limiting activity, decreased activity tolerance, decreased balance, decreased endurance, difficulty walking, decreased ROM, decreased strength, hypomobility, and pain.   ACTIVITY LIMITATIONS: carrying, lifting, bending, standing, squatting, stairs, transfers, and locomotion level  PARTICIPATION LIMITATIONS: meal prep, laundry, and community activity  PERSONAL  FACTORS: Fitness and 1-2 comorbidities: pulmonary fibrosis, h/o R hip and pelvic fracture  are also affecting patient's functional outcome.   REHAB POTENTIAL: Good  CLINICAL DECISION MAKING: Stable/uncomplicated  EVALUATION COMPLEXITY: Low   GOALS: Goals reviewed with patient? Yes  SHORT TERM GOALS: Target date: 03/09/23 I HEP Baseline: intiated today Goal status: IN PROGRESS  03/23/23 MET  LONG TERM GOALS: Target date: 5/23/242.  1.Improve R ankle Rom for dorsiflexion from 6 to 10 degrees or greater Baseline: 6 Goal status: IN PROGRESS  03/23/23 MET 04/05/23: 10 degrees met so far 04/13/23 MET  2.  Improve unilateral balance to 10 sec or greater on each leg for reduced fall risk Baseline: 3 sec each leg Goal status: IN PROGRESS  03/23/23 04/05/23: goal progressing so far, 10 sec L leg, 6 R   04/13/23 Left 15 sec. RT 6sec, 04/18/23-SLS addressed in treatment, not re-assessed.  05/18/23 met for Left not RT  3.  Able to complete unilateral heel raise R LE from 6 reps to 15 ore greater Baseline:  Goal status: IN PROGRESS 3 /28/24 04/05/23: 14 reps R, 15 reps L   04/13/23  RT 15 x with pain progressing, 04/26/23-performed B heel raises x 20 reps. Ongoing.  05/04/23 MET  PLAN:  PT FREQUENCY: 1-2x/week  PT DURATION: 12 weeks  PLANNED INTERVENTIONS: Therapeutic exercises, Therapeutic activity, Neuromuscular re-education, Balance training, Gait training, Patient/Family education, Self Care, and Joint mobilization  PLAN FOR NEXT SESSION Discussed D/C at end of cert period 1/61 Rec MD follow up as we are getting to end of therapy and weakness/pain come and goes. D/C next visit so 20th visit not done today ( no active FOTO)  Tola Rivest DPT 05/18/23 2:44 PM [facility] [address] [place], [place], [address] Phone: [phone]   Fax:  [phone]

## 2023-05-25 ENCOUNTER — Ambulatory Visit
Admission: RE | Admit: 2023-05-25 | Discharge: 2023-05-25 | Disposition: A | Discharge status: Home / Self Care | Payer: Medicare Other | Source: Ambulatory Visit | Attending: Family Medicine | Admitting: Family Medicine

## 2023-05-25 DIAGNOSIS — Z780 Asymptomatic menopausal state: Secondary | ICD-10-CM

## 2023-05-25 DIAGNOSIS — N958 Other specified menopausal and perimenopausal disorders: Secondary | ICD-10-CM | POA: Diagnosis not present

## 2023-05-25 DIAGNOSIS — M8588 Other specified disorders of bone density and structure, other site: Secondary | ICD-10-CM | POA: Diagnosis not present

## 2023-05-25 DIAGNOSIS — E349 Endocrine disorder, unspecified: Secondary | ICD-10-CM | POA: Diagnosis not present

## 2023-05-25 NOTE — Progress Notes (Signed)
See mychart note.  H/o pred use Dexa 07/2020: lowest T left femur -2.2; rec cal vit d and exercise. DEXA 04/2023: Lowest T left femur -1.9, continue calcium vitamin D and exercise.  Recheck 2 to 3 years.

## 2023-06-07 ENCOUNTER — Encounter (INDEPENDENT_AMBULATORY_CARE_PROVIDER_SITE_OTHER): Payer: Medicare Other | Admitting: Ophthalmology

## 2023-06-07 DIAGNOSIS — H43813 Vitreous degeneration, bilateral: Secondary | ICD-10-CM

## 2023-06-07 DIAGNOSIS — H353111 Nonexudative age-related macular degeneration, right eye, early dry stage: Secondary | ICD-10-CM

## 2023-06-07 DIAGNOSIS — H353221 Exudative age-related macular degeneration, left eye, with active choroidal neovascularization: Secondary | ICD-10-CM

## 2023-06-14 DIAGNOSIS — H26491 Other secondary cataract, right eye: Secondary | ICD-10-CM | POA: Diagnosis not present

## 2023-06-19 DIAGNOSIS — M79604 Pain in right leg: Secondary | ICD-10-CM | POA: Diagnosis not present

## 2023-07-05 ENCOUNTER — Encounter (INDEPENDENT_AMBULATORY_CARE_PROVIDER_SITE_OTHER): Payer: Medicare Other | Admitting: Ophthalmology

## 2023-07-05 ENCOUNTER — Other Ambulatory Visit: Payer: Self-pay | Admitting: Family Medicine

## 2023-07-05 DIAGNOSIS — H43813 Vitreous degeneration, bilateral: Secondary | ICD-10-CM

## 2023-07-05 DIAGNOSIS — H353112 Nonexudative age-related macular degeneration, right eye, intermediate dry stage: Secondary | ICD-10-CM | POA: Diagnosis not present

## 2023-07-05 DIAGNOSIS — H353221 Exudative age-related macular degeneration, left eye, with active choroidal neovascularization: Secondary | ICD-10-CM

## 2023-07-06 NOTE — Telephone Encounter (Signed)
LOV: 11/25/2022  Last refill Date: 01/03/2023  Qty: 90  Refills: 1

## 2023-07-15 DIAGNOSIS — M79604 Pain in right leg: Secondary | ICD-10-CM | POA: Diagnosis not present

## 2023-07-15 DIAGNOSIS — M2550 Pain in unspecified joint: Secondary | ICD-10-CM | POA: Diagnosis not present

## 2023-07-27 DIAGNOSIS — M5417 Radiculopathy, lumbosacral region: Secondary | ICD-10-CM | POA: Diagnosis not present

## 2023-08-01 DIAGNOSIS — M5416 Radiculopathy, lumbar region: Secondary | ICD-10-CM | POA: Diagnosis not present

## 2023-08-01 DIAGNOSIS — M5459 Other low back pain: Secondary | ICD-10-CM | POA: Diagnosis not present

## 2023-08-02 ENCOUNTER — Encounter (INDEPENDENT_AMBULATORY_CARE_PROVIDER_SITE_OTHER): Payer: Medicare Other | Admitting: Ophthalmology

## 2023-08-02 DIAGNOSIS — H353112 Nonexudative age-related macular degeneration, right eye, intermediate dry stage: Secondary | ICD-10-CM | POA: Diagnosis not present

## 2023-08-02 DIAGNOSIS — H43813 Vitreous degeneration, bilateral: Secondary | ICD-10-CM | POA: Diagnosis not present

## 2023-08-02 DIAGNOSIS — H353221 Exudative age-related macular degeneration, left eye, with active choroidal neovascularization: Secondary | ICD-10-CM | POA: Diagnosis not present

## 2023-08-03 DIAGNOSIS — M545 Low back pain, unspecified: Secondary | ICD-10-CM | POA: Insufficient documentation

## 2023-08-03 DIAGNOSIS — M5459 Other low back pain: Secondary | ICD-10-CM | POA: Diagnosis not present

## 2023-08-03 DIAGNOSIS — M79604 Pain in right leg: Secondary | ICD-10-CM | POA: Diagnosis not present

## 2023-08-30 ENCOUNTER — Encounter (INDEPENDENT_AMBULATORY_CARE_PROVIDER_SITE_OTHER): Payer: Medicare Other | Admitting: Ophthalmology

## 2023-08-30 DIAGNOSIS — H353221 Exudative age-related macular degeneration, left eye, with active choroidal neovascularization: Secondary | ICD-10-CM

## 2023-08-30 DIAGNOSIS — H353112 Nonexudative age-related macular degeneration, right eye, intermediate dry stage: Secondary | ICD-10-CM

## 2023-08-30 DIAGNOSIS — H43813 Vitreous degeneration, bilateral: Secondary | ICD-10-CM

## 2023-09-08 ENCOUNTER — Other Ambulatory Visit: Payer: Medicare Other

## 2023-09-14 ENCOUNTER — Encounter: Payer: Self-pay | Admitting: Internal Medicine

## 2023-09-21 DIAGNOSIS — Z23 Encounter for immunization: Secondary | ICD-10-CM | POA: Diagnosis not present

## 2023-09-27 ENCOUNTER — Encounter (INDEPENDENT_AMBULATORY_CARE_PROVIDER_SITE_OTHER): Payer: Medicare Other | Admitting: Ophthalmology

## 2023-09-27 DIAGNOSIS — H353221 Exudative age-related macular degeneration, left eye, with active choroidal neovascularization: Secondary | ICD-10-CM

## 2023-09-27 DIAGNOSIS — H43813 Vitreous degeneration, bilateral: Secondary | ICD-10-CM

## 2023-09-27 DIAGNOSIS — H353112 Nonexudative age-related macular degeneration, right eye, intermediate dry stage: Secondary | ICD-10-CM | POA: Diagnosis not present

## 2023-10-02 ENCOUNTER — Encounter: Payer: Self-pay | Admitting: Cardiovascular Disease

## 2023-10-03 ENCOUNTER — Ambulatory Visit (HOSPITAL_BASED_OUTPATIENT_CLINIC_OR_DEPARTMENT_OTHER): Payer: Medicare Other | Admitting: Internal Medicine

## 2023-10-03 DIAGNOSIS — J439 Emphysema, unspecified: Secondary | ICD-10-CM

## 2023-10-03 DIAGNOSIS — J9611 Chronic respiratory failure with hypoxia: Secondary | ICD-10-CM | POA: Diagnosis not present

## 2023-10-03 MED ORDER — FUROSEMIDE 20 MG PO TABS: 20.0000 mg | ORAL_TABLET | ORAL | 11 refills | Status: DC | PRN

## 2023-10-03 MED ORDER — POTASSIUM CHLORIDE CRYS ER 10 MEQ PO TBCR: 10.0000 meq | EXTENDED_RELEASE_TABLET | Freq: Every day | ORAL | 6 refills | Status: DC | PRN

## 2023-10-03 NOTE — Patient Instructions (Signed)
Spirometry and DLCO Performed Today.  

## 2023-10-03 NOTE — Progress Notes (Signed)
Spirometry and DLCO Performed Today.  

## 2023-10-10 ENCOUNTER — Ambulatory Visit
Admission: RE | Admit: 2023-10-10 | Discharge: 2023-10-10 | Disposition: A | Discharge status: Home / Self Care | Payer: Medicare Other | Source: Ambulatory Visit | Attending: Internal Medicine | Admitting: Internal Medicine

## 2023-10-10 DIAGNOSIS — I700 Atherosclerosis of aorta: Secondary | ICD-10-CM | POA: Diagnosis not present

## 2023-10-10 DIAGNOSIS — Z87891 Personal history of nicotine dependence: Secondary | ICD-10-CM

## 2023-10-10 DIAGNOSIS — J9611 Chronic respiratory failure with hypoxia: Secondary | ICD-10-CM

## 2023-10-10 DIAGNOSIS — J8410 Pulmonary fibrosis, unspecified: Secondary | ICD-10-CM | POA: Diagnosis not present

## 2023-10-10 MED ORDER — POTASSIUM CHLORIDE ER 10 MEQ PO CPCR: 10.0000 meq | ORAL_CAPSULE | Freq: Every day | ORAL | 3 refills | Status: DC | PRN

## 2023-10-25 ENCOUNTER — Ambulatory Visit: Payer: Medicare Other | Admitting: Internal Medicine

## 2023-10-25 ENCOUNTER — Ambulatory Visit (INDEPENDENT_AMBULATORY_CARE_PROVIDER_SITE_OTHER): Payer: Medicare Other | Admitting: Internal Medicine

## 2023-10-25 ENCOUNTER — Encounter (HOSPITAL_BASED_OUTPATIENT_CLINIC_OR_DEPARTMENT_OTHER): Payer: Medicare Other

## 2023-10-25 ENCOUNTER — Encounter: Payer: Self-pay | Admitting: Internal Medicine

## 2023-10-25 VITALS — BP 135/76 | HR 101 | Temp 98.3°F | Ht 63.0 in | Wt 268.4 lb

## 2023-10-25 DIAGNOSIS — J849 Interstitial pulmonary disease, unspecified: Secondary | ICD-10-CM | POA: Diagnosis not present

## 2023-10-25 NOTE — Progress Notes (Signed)
@Patient  ID: Patty [name], female    DOB: 03/09/1951, 72 y.o.   MRN: [id number]  Chief Complaint  Patient presents with   Follow-up    dyspnea     Referring provider: No ref. provider found  HPI: 72 year old female former smoker seen for pulmonary consulokay guarded because then maybe they can meet him right now for the registryt 05/09/2017 for progressive dyspnea since 2004.[name] Patient has polycythemia vera followed by hematology and [place] She has Crohn's disease    IOV  05/09/2017  Chief Complaint  Patient presents with   Pulmonary Consult    Pt referred for SOB with activity x years. Pt states over the last year the DOE has worsened. Pt denies cough and CP/tightness and f/c/s.     72 year old obese female. In 2004 Started noticing insidious onset of shortness of breath. In 2005 more [place] from [place] and probably gain over 60 pounds of weight. She has extensive workup at that time according to her history. She was then diagnosed with polycythemia by Dr. Desma Mcgregor at [facility]. She does not recollect any phlebotomies for this. However in the last 4-5 years she's had worsening shortness of breath. Noticeable with exertion particularly in the gym and doing standing exercises but not so much with sitting exercises. Relieved by rest. When she does some yard work she notices some associated wheezing as well.  Walking desaturation test in the office she did desaturate to 88% and did get tachycardic. She says that a chest x-ray done by primary care physicians interstitial pulmonary fibrosis. I personally visualized the chest x-ray done 04/03/2017: There is some interstitial markings but it is obscured by her obesity. I'm not so certain that is definite ILD. There is no lab work in the record. There is no PFT a CT scan available for visualization     05/29/2017 Follow up : Dyspnea  Patient returns for a two-week follow-up. Patient was seen for pulmonary consult  05/09/2017 for progressive dyspnea since 2004. Patient was set up for a high resolution CT chest that showed a very mild basilar predominant subpleural reticulation and bronchiectasis which could be due to nonspecific interstitial pneumonitis.. CT did have incidental findings of enlarged pulmonary arteries and aortic and coronary calcification. Patient had pulmonary function test on May 23 that showed an FEV1 at 77%, ratio 73, FVC 81, no significant bronchodilator response, total lung capacity 100%, DLCO 52%. Overnight oximetry test did show significant desaturations. We discussed beginning oxygen at bedtime.  Patient denies significant cough. Says that she get short of breath with walking. Has daytime fatigue and low energy..  Interstitial lung disease patient questionnaire was completed as follows Patient has no previous use of Macrodantin, amiodarone, methotrexate. She has been treated with prednisone for her Crohn's in the past. Patient has had extensive travel with brief visits to multiple state and country's.. [place]. Did live in a apartment that she did notice mold for 4 years. She does have Crohn's disease and was on Mesamaline for many years. Patient says she has had a humidifier and hot tub and previous house. She has no indoor birds. She does have a dog. Patient says she was told that she had a blood clot in the past but was never found.   OV 08/15/2017  Chief Complaint  Patient presents with   Follow-up    Pt still gets occ. SOB. Other than that, pt states that she has been doing good.  Denies any cough or CP.     Follow-up multifactorial dyspnea with interstitial lung disease concern.   Regarding interstitial lung disease concern: Mid May 2018 she had high resolution CT chest that showed interstitial lung disease very mild but unclear if possible UIP pattern. Autoimmune test was negative. Isolated reduction in diffusion capacity 50%. [employer] of chest  physicians questionnaire shows previous Crohn's disease and also mold exposure previously. Overall she feels stable  Regarding dyspnea: She underwent echocardiogram in June 2018 showed grade 1 diastolic dysfunction. Had abnormal cardiac stress test July 2018 followed by cardiac cath early August 2018 that showed normal coronary angiogram but elevated left ventricular end-diastolic pressure. Dietary therapy has been recommended but she is unaware of these results.  Overall she is here to discuss these results.  IMPRESSION: 1. Suspect very mild basilar predominant subpleural reticulation and bronchiolectasis, which may be due to nonspecific interstitial pneumonitis. 2. Aortic atherosclerosis (ICD10-170.0). Coronary artery calcification. 3. Enlarged pulmonary arteries, indicative of pulmonary arterial hypertension.     Electronically Signed   By: Leanna Battles M.D.   On: 05/13/2017 07:33    OV 10/17/2017  Chief Complaint  Patient presents with   Follow-up    Pt states that she has had good days and bad days since last visit. States that when she was in [place] for a month, breathing was better; still became SOB but not as bad as in [place]. Pt's SOB is mainly on exertion. Denies any cough or CP.    Follow-up dyspnea that is multifactorial due to obesity, physical deconditioning and diastolic dysfunction Follow-up mild interstitial lung disease not otherwise specified  Last visit I started her on Lasix. I was only supposed to see herin 6 months or so. However she's had problems tolerating Lasix and potassium. It fluctuates between palpitations and hypertension. She feels the palpitations might be related to potassium depletion after taking Lasix. But she also tells me that the Lasix does help her dyspnea. At this point in time her condition is to see cardiologist. Off note she did send the email message asking these questions on 09/28/2017 made a reply that she tells me that she never got  the reply.   OV 02/13/2018  Chief Complaint  Patient presents with   Follow-up    PFT done 02/05/18.  Pt states she has been doing good. Was sick in january but states she is doing better.  DME: [facility] 4L pulse    Follow-up dyspnea that is multifactorial due to obesity, physical deconditioning and diastolic dysfunction Follow-up mild interstitial lung disease not otherwise specified v concern   72 year old female with concern for interstitial lung disease.  Since her last visit she continues to do well.  She is attending pulmonary rehabilitation.  She feels better and less short of breath.  She uses oxygen with exertion at rehab saying she needs it.  She has upcoming travel in summer to [place] and wanted some flight information oxygen form filled out.  She is wondering if mesalamine that she took several years ago was the cause of interstitial lung disease or her Crohn's disease.  But overall she is stable.  Pulmonary function test shows stability and FVC since last 1 year with some improvement in DLCO.  High-resolution CT scan of the chest interpreted by thoracic radiology report suspected ILD.   OV 08/28/2018  Chief Complaint  Patient presents with   Follow-up    PFT performed today.  Pt states she has had both good and bad  days since last visit. Pt denies any complaints of cough, SOB, or CP but states she is having some problems with congestion since she has been back from vacation. Pt has been having problems with getting O2 supplies taken care of with DME.    Follow-up dyspnea that is multifactorial due to obesity, physical deconditioning and diastolic dysfunction Follow-up concern for  mild interstitial lung disease not otherwise specified  Patty [name] - Presents for follow-up for the above issues. Her dyspnea significantly better following a 30 pound weight loss and continued rehabilitation. She uses oxygen with rehabilitation. Today walking desaturation test 185 feet 3 laps on room  air: She dropped to 87% on the second lap. But she is feeling better. She had spirometry today that shows improvement in FVC concomitant with weight loss but no change in diffusion capacity that reflects the presence of ongoing possible ILD.She continues to lose more weight she believes that she could lose another 30 pounds before the next visit.     OV 03/12/2019  Subjective:  Patient ID: Patty [name], female , DOB: 15-May-1951 , age 14 y.o. , MRN: [id number] , ADDRESS: [address] [place] [address]   03/12/2019 -   Chief Complaint  Patient presents with   Follow-up    PFT performed today.  Pt states she has been doing good since last visit. States she still becomes SOB with exertion, and has phlegm in the mornings, postnasal drainage which has used flonase to help. Pt also uses POC as needed.    Follow-up dyspnea that is multifactorial due to obesity, physical deconditioning and diastolic dysfunction Follow-up  mild interstitial lung disease not otherwise specifieds -with stability 2018 through 2020 [non-IPF pattern]; on observation   HPI Inna Syvertson 72 y.o. -presents for follow-up.  She is attending pulmonary rehabilitation and using oxygen with exertion.  Overall she feels stable.  Subjective symptom parameters and objective parameters are documented below.  She had high-resolution CT chest that showed shows that she has ILD.  At this point we can be confident that she has mild ILD but it is stable based on symptoms walking test and pulmonary function test.  The previous autoimmune test was negative.      OV 10/07/2019  Subjective:  Patient ID: Patty [name], female , DOB: July 09, 1951 , age 65 y.o. , MRN: [id number] , ADDRESS: [address] [place] [address]   10/07/2019 -   Chief Complaint  Patient presents with   Follow-up     Follow-up dyspnea that is multifactorial due to obesity, physical deconditioning and diastolic dysfunction Follow-up  mild  interstitial lung disease not otherwise specifieds -with stability 2018 through 2020 [non-IPF pattern]; on observation Follow-up mild pulmonary emphysema present on high-resolution CT chest  HPI Lacie Lehl 72 y.o. -presents for follow-up.  Prior visit was pre-pandemic.  Since then she has been sedentary at home.  She tells me that she is more short of breath.  The symptom scores below reflect that.  She thinks is because of the 30 pound weight gain because of sedentary living and social isolation following the onset of the pandemic.  She is frustrated by this.  Deep down she thinks her ILD is stable.  Pulmonary function test shows a drop in FVC but stability and DLCO suggesting weight gain is an ongoing issue for worsening dyspnea and pulmonary function test.  She is willing to have a high-dose flu shot today.  She also tells me that she is not taking her  Spiriva because it was expensive.  Although it did help her she is willing to try again and try to price it.  She also stated that her mom who never smoked had emphysema but her dad smoked heavily.  She has never been tested for alpha-1.   [place] destats -dropped. But tries not to use it in public    IMPRESSION: 1. Very mild scattered basilar subpleural reticulation and ground-glass similar to 01/29/2018. Nonspecific interstitial pneumonitis can not be excluded. Findings are indeterminate for UIP per consensus guidelines: Diagnosis of Idiopathic Pulmonary Fibrosis: An Official ATS/ERS/JRS/ALAT Clinical Practice Guideline. Am Rosezetta Schlatter Crit Care Med Vol 198, Iss 5, [id number], Aug 26 2017. 2. Question cirrhosis. 3. Aortic atherosclerosis (ICD10-170.0). Coronary artery calcification. 4. Enlarged arteries, arterial hypertension. 5.  Emphysema (ICD10-J43.9).     Electronically Signed   By: Leanna Battles M.D.   On: 03/06/2019 16:59   ROS - per HPI     OV 06/23/2020  Subjective:  Patient ID: Patty [name], female ,  DOB: [birth year] , age 33 y.o. , MRN: [id number] , ADDRESS: [address] [place] [address]   06/23/2020 -   Chief Complaint  Patient presents with   Follow-up    shortness of breath with exertion   Follow-up dyspnea that is multifactorial due to obesity, physical deconditioning and diastolic dysfunction  Follow-up  mild interstitial lung disease not otherwise specifieds -with stability 2018 through 2020 [non-IPF pattern/indeterminate   -; on observation  - last CT Mach 2020 0> July 2021 wthout progression  Follow-up mild pulmonary emphysema present on high-resolution CT chest - alpha 1 MM  - o2 with exertion  - advised spiriva oct 2020 but too expensive  HPI Dawan Betro 72 y.o. -returns for routine follow-up.  She says she has been doing well.  She uses portable oxygen cranks it up to 4 L - 5 L and then walks every day for 30 minutes.  Overall she feels stable.  Her symptom score itself is stable.  However on pulmonary function testing her FVC shows a decline.  This decline started in October 2020 and since then it is stable.  She attributes this to weight gain.  There is what she told me last time as well.  However when we walked her she seemed to desaturate much more easily.  We also needed to corrected desaturation at 5 L.  She thinks it is because the portable oxygen system is not delivering oxygen currently and is a technical issue with a portable machine.  However the distance of desaturation seems to worsen.  There is no leg swelling although she has some mild varicose veins at baseline.  She is not on nighttime oxygen and she recollects attest that this some years ago and was normal back then.  Currently she is only using portable oxygen.  Her last echocardiogram was few years ago.  There is no hemoptysis or worsening cough.  She got a Covid vaccine in February 2021 and she thinks things might of changed since then.   After she left I was able to review the home  palliative care note from 01/11/2022:.  She expressed her life wishes that she does not want to be put on the ventilator.  Does not want a feeding tube.  She likes to pass away peacefully like her mother a DNR and MOST form were given.  They will address this in the future with her.  Results for MONSERRATE, WARNEKE ANN (MRN [id number]) as of  06/23/2020 11:49  Ref. Range 05/17/2017 13:11 02/05/2018 12:14 08/28/2018 11:22 03/12/2019 14:47 10/01/2019 15:58 30 pound weight gain 06/09/2020 11:25  FVC-Pre Latest Units: L 2.61 2.57 2.80 2.73 2.38 2.42  FVC-%Pred-Pre Latest Units: % 84 83 94 91 81 82  FEV1-Pre Latest Units: L 1.83 1.71 1.96 1.83 1.65 1.53   Results for ERNELL, SKALSKI ANN (MRN [id number]) as of 06/23/2020 11:49  Ref. Range 05/17/2017 13:11 02/05/2018 12:14 08/28/2018 11:22 03/12/2019 14:47 10/01/2019 15:58 06/09/2020 11:25  DLCO unc Latest Units: ml/min/mmHg 12.64 15.15 14.53 13.77 14.39 13.48  DLCO unc % pred Latest Units: % 52 62 62 72 75 70    ROS - per HPI     OV 02/23/2021  Subjective:  Patient ID: Patty [name], female , DOB: [birth year] , age 66 y.o. , MRN: [id number] , ADDRESS: [address] [place] [address] PCP Willow Ora, MD Patient Care Team: Willow Ora, MD as PCP - General (Family Medicine) Pyrtle, Carie Caddy, MD as Consulting Physician (Gastroenterology) Shamleffer, Konrad Dolores, MD as Consulting Physician (Endocrinology) Kalman Shan, MD as Consulting Physician (Pulmonary Disease)  This Provider for this visit: Treatment Team:  Attending Provider: Kalman Shan, MD   Follow-up dyspnea that is multifactorial due to obesity, physical deconditioning and diastolic dysfunction, ILD and new dx Group 3 PAH - dec 2021  45 pppd prior smoking hix  Follow-up  mild interstitial lung disease not otherwise specifieds -with stability 2018 through 2020 [non-IPF pattern/indeterminate   -; on observation  - last CT Mach 2020 - > July 2021 without  progression  Inconsisent dxx of  pulmonary emphysema present on high-resolution CT chest - alpha 1 MM  - o2 with exertion  - advised spiriva oct 2020 but too expensive  Reported in only 1 CT. No mention in July 2021 CT  WHO group 3 Pulm htn with elevated PCWD - [facility] 12/07/20:   RA 13/12 mean 9 mmHg RV 48/8, EDP 12 PA 50/29 mean 38 PCWP 22/20 mean 20   O2 saturations: PA 62, Ao 97, SVC 64   CO 4.26 L/m, CI 1.96 Trans-pulmonic gradient 18 mmHg PVR 4.2 Woods Units  Grade 1 Disast dysfhn  - reportred July 2021 echo  Obesity  02/23/2021 -   Chief Complaint  Patient presents with   Follow-up    Doing ok     HPI Mckayle Brei 72 y.o. -returns for 29-month follow-up.  She has inconsistent diagnosis of emphysema.  Was reported in 1 scan but not another scans.  She has ILD.  She is foregoing biopsy.  She tells me overall that she is stable.  Although there are some weight gain.  She feels in the interim Metformin messed up her GI system and cause weight gain but she is glad to be out of it.  She had 6-minute walk test on 02/15/2021 at pulmonary rehabilitation.  She walked 7 and 15 feet in 6 minutes.  She did not break at all.  Her pulse ox dropped below 88% 2 times.  She corrected with 4 L of oxygen.  Her resting oxygen saturation was 96%.  And lowest was 86%.  She also had right heart catheterization in December 2021.  She has elevated pulmonary pressures but also elevated wedge.  Cardiology is following this up and she is on diuresis.  We spent some time discussing her care option.  She had pulmonary function test that shows stability in the last year and a half but declined in 2 years  time.  Her high-resolution CT scan of the chest done in summer 2021 shows stability over 4 years.  She is not interested in antifibrotic's because of her colitis and also given her stability.  She does not want to go through pulmonary lung biopsy given the risks  She generally tries to avoid  medications.  She is happy going to pulmonary rehabilitation.  We discussed inhaled treprostinil as approved therapy for pulmonary hypertension -expressed to her the overall safety profile for over 20 years and approved recently for pulmonary hypertension and WHO group 3.  Explained the minimal side effect risk but she does not want to go through this  We discussed the option of participating in a clinical trial.  We discussed what is called the PULSE inhaled nitric oxide device study.  This is a 28-week plus study.  It was a device and a nitric oxide supply study.  She is actually interested in this.  She is participating clinical trials.  She understands the voluntary nature of this.  She understands the risks that come with clinical trials.  She understands the control of risk through close follow-up and interventions..  However her 6-minute walk test is quite adequate and also she starting pulmonary rehabilitation so she would not be able to participate in this trial for several months.  In addition she is aware that inhaled treprostinil which is standard of care therapy is currently on exclusion in this protocol current amendment.  She understands if she qualifies and chooses this trial that this would be a limitation.  We have given her the consent for review but at this time she does not qualify for this trial.  HRCT July 2021   IMPRESSION: 1. Spectrum of findings suggestive of a mild basilar predominant fibrotic interstitial lung disease without appreciable interval progression since baseline 05/12/2017 high-resolution chest CT. Favor NSIP, with UIP not excluded. Findings are indeterminate for UIP per consensus guidelines: Diagnosis of Idiopathic Pulmonary Fibrosis: An Official ATS/ERS/JRS/ALAT Clinical Practice Guideline. Am Rosezetta Schlatter Crit Care Med Vol 198, Iss 5, [phone], Aug 26 2017. 2. Dilated main pulmonary artery, stable, suggesting chronic pulmonary arterial hypertension. 3. Aortic  Atherosclerosis (ICD10-I70.0).     Electronically Signed   By: Delbert Phenix M.D.   On: 07/10/2020 15:50     OV 05/31/2021  Subjective:  Patient ID: Patty [name], female , DOB: October 09, 1951 , age 77 y.o. , MRN: [id number] , ADDRESS: [address] [place] [address] PCP Willow Ora, MD Patient Care Team: Willow Ora, MD as PCP - General (Family Medicine) Pyrtle, Carie Caddy, MD as Consulting Physician (Gastroenterology) Shamleffer, Konrad Dolores, MD as Consulting Physician (Endocrinology) Kalman Shan, MD as Consulting Physician (Pulmonary Disease)  This Provider for this visit: Treatment Team:  Attending Provider: Kalman Shan, MD    Follow-up dyspnea that is multifactorial due to obesity, physical deconditioning and diastolic dysfunction, ILD and new dx Group 3 PAH - dec 2021  45 pppd prior smoking hix  Follow-up  mild interstitial lung disease not otherwise specifieds -with stability 2018 through 2020 [non-IPF pattern/indeterminate   -on observation  -last CT Mach 2020 - > July 2021 without progression  Inconsisent dxx of  pulmonary emphysema present on high-resolution CT chest - alpha 1 MM  - o2 with exertion  - advised spiriva oct 2020 but too expensive  Reported in only 1 CT. No mention in July 2021 CT  WHO group 3 Pulm htn with elevated PCWD - [facility] 12/07/20:  RA 13/12 mean 9 mmHg RV 48/8, EDP 12 PA 50/29 mean 38 PCWP 22/20 mean 20   O2 saturations: PA 62, Ao 97, SVC 64   CO 4.26 L/m, CI 1.96 Trans-pulmonic gradient 18 mmHg PVR 4.2 Woods Units  Grade 1 Disast dysfhn  - reportred July 2021 echo  Obesity   05/31/2021 -   Chief Complaint  Patient presents with   Follow-up    Pt states she has been doing okay since last visit. States she has begun pulmonary rehab which has helped.     HPI Deborra Cossey 72 y.o. -returns for follow-up.  She continues on supportive care with oxygen.  Room air at rest 3-5 L pulsed with  oxygen.  For the last few months she is on pulsed oxygen.  She uses 2 L nasal cannula at night with her CPAP.  She is completed pulmonary rehabilitation.  For the last few months she is using pulsed oxygen.  She feels this works better.  She is [facility] [employer].  She does elliptical or treadmill.  She feels her symptoms have improved.  She is continue to gain some weight though.  She again reported that she feels sensitive to medication and wants avoid antifibrotic's.  She was interested this time and discussing treprostinil versus pulsed research protocol for patients with interstitial lung disease and hypoxemic respiratory failure.  She has pulmonary hypertension based on 2021 December right heart catheterization.  We discussed the long-term safety profile of inhaled treprostinil.  Discussed the side effect profile.  After reflection about the fact the increase study showed improvement in 6-minute walk test, subgroup analysis potential modification of ILD and other composite outcome improvement.     IMPRESSION: 1. Spectrum of findings suggestive of a mild basilar predominant fibrotic interstitial lung disease without appreciable interval progression since baseline 05/12/2017 high-resolution chest CT. Favor NSIP, with UIP not excluded. Findings are indeterminate for UIP per consensus guidelines: Diagnosis of Idiopathic Pulmonary Fibrosis: An Official ATS/ERS/JRS/ALAT Clinical Practice Guideline. Am Rosezetta Schlatter Crit Care Med Vol 198, Iss 5, [phone], Aug 26 2017. 2. Dilated main pulmonary artery, stable, suggesting chronic pulmonary arterial hypertension. 3. Aortic Atherosclerosis (ICD10-I70.0).     Electronically Signed   By: Delbert Phenix M.D.   On: 07/10/2020 15:50   OV 09/07/2021  Subjective:  Patient ID: Patty [name], female , DOB: 1951/05/20 , age 65 y.o. , MRN: [id number] , ADDRESS: [address] [place] [address] PCP Willow Ora, MD Patient Care  Team: Willow Ora, MD as PCP - General (Family Medicine) Rhea Belton, Carie Caddy, MD as Consulting Physician (Gastroenterology) [facility], Konrad Dolores, MD as Consulting Physician (Endocrinology) Kalman Shan, MD as Consulting Physician (Pulmonary Disease)  This Provider for this visit: Treatment Team:  Attending Provider: Kalman Shan, MD  Chronic hypoxemic respiratory failure due to all of the below ILD (interstitial lung disease) [facility])  -  Decline PF March 2020 through February 2022  but stable October 2020 through February 2022 on pulmonary function test -Stable on CT scan of the chest 2018 through July 2021 -Non-IPF likely - such as NSIP -Overall supportive care of plan without antifibrotic's given history of colitis [antifibrotic's can exacerbate this situation] and prior stability -You are using room air at rest with 3-5 L with exertion -pulse oxygen for the last few months    WHO group 3 pulmonary arterial hypertension (HCC) with elevaed PCWP to 20 on Dec 2021 - started tyvaso July 2022  WHO group 3  pulmonary arterial hypertension (HCC) with elevaed PCWP to 20 on Dec 2021 - -Continue CPAP at ngith with 2L [place] at night  09/07/2021 -   Chief Complaint  Patient presents with   Follow-up    PFT performed today.  States that she still becomes SOB with exertion. Pt is now on Tyvaso and states she finds herself needing her O2 more.     HPI Sandhya Evens 72 y.o. -returns for follow-up.  She started treprostinil in July 2022.  She says initially started doing better.  But once she got to 7 puffs 4 times daily she started getting more short of breath.  She felt overall that she is able to take a deep breath and feel less fatigued but when she got to that dose she said 1 time she walked across the room without oxygen which she normally walks without oxygen and she felt dizzy and winded.  Since then the symptoms have persisted.  She is also gained significant amount of  weight.  She is gained 16 pounds.  Her pulmonary function test is worse with a decline in both FVC and DLCO.  She says she is now needing 6 L of nasal cannula oxygen when she exercises.  Previously 5 L nasal cannula.  Symptom score wise and walking desaturation test wise she seems the same.  She does not want to do antifibrotic's because of prior history of colectomy.  At this point she is not sure if the treprostinil is helping her or hurting her or not doing anything.  OV 10/19/2021  Subjective:  Patient ID: Patty [name], female , DOB: 1951/01/31 , age 85 y.o. , MRN: [id number] , ADDRESS: [address] 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 - diagnosed Sept 2022 -Non-IPF likely - such as NSIP -  Decline PF March 2020 through February 2022  but stable October 2020 through February 2022  -> declined sept 2022 PFT -Stable on CT scan of the chest 2018 through July 2021 -You are using room air at rest with 3-5 L with exertion -pulse oxygen for the last few months - started esbriet     WHO group 3 pulmonary arterial hypertension (HCC) with elevaed PCWP to 20 on Dec 2021 - started tyvaso July 2022 -> stopped sept 2022 of concern PFT declined could ave been due to tyvaso -> but restarted later in sept 2022   WHO group 3 pulmonary arterial hypertension (HCC) with elevaed PCWP to 20 on Dec 2021 - -Continue CPAP at ngith with 2L [place] at night   10/19/2021 -  followup  ILD   Type of visit: Telephone/Video Circumstance: COVID-19 national emergency Identification of patient Lareta Leggitt with [birth year] and MRN [id number] - 2 person identifier Risks: Risks, benefits, limitations of telephone visit explained. Patient understood and verbalized agreement to proceed Anyone else on call: just patient Patient location: [phone] - her cell.  At her home This provider location: [address] [place]   HPI Zyrah Morath 72 y.o. - esbriet still pending. BAck on tyvaso since  mid-late sept 2022. Now doing it 4x/day - 5x/each time.   Also reports sewage leak since April/May 2022 Occassional odor. Some leakage through cleanup pipe. FEels this has made her respiration worse instead of tyvaso. Initialy she thought was tyvaso but later realized might have been due to sweage leak and finally fixed in sept 2022.She feels after long term stability things got worse because of sweage leak. She feels since leak fixed (Same time went back on tyvaso) she is better. In gym mets were down to 2.1 but now upto 2.4 mets (baseline 2.7 mets). NExt visit with mid dec 2022.   She wants to know if progression is perrmanent  Esbriet sill in process. Arriving in 2 days. She wil start at 1 pill tid x 1 week -> 2 pill tid and maintain  She can do visit 11/04/21 - 3.00pm - with [name] - video to assess followup on esbriet    OV 11/11/21 - video APP  72 yo female former smoker followed for ILD and Emphysema  Medical history significant for Crohn's Disease and DCHF   Today's video visit is a 1 month follow up for ILD . She has recently started on Esbriet 2 weeks ago, does notice some symptoms of fatigue , joint aches and fatigue. Started on Esbriet 267 mg 2 tab Twice daily, this week. No v/d. Some nausea . No bloody stools. Some intermittent pain under right ribs on/off. No urinary issues. No syncope.  Remains on Tyvaso Four times a day   Remains on Oxygen 6l/m with activity and  2l/m At bedtime  . None at rest  No change in O2 requirements   OV 12/07/2021  Subjective:  Patient ID: Patty [name], female , DOB: 02/26/1951 , age 15 y.o. , MRN: [id number] , ADDRESS: [address] [place] [address] PCP Willow Ora, MD Patient Care Team: Willow Ora, MD as PCP - General (Family Medicine) Wendall Stade, MD as PCP - Cardiology (Cardiology) Pyrtle, Carie Caddy, MD as Consulting Physician (Gastroenterology) [facility], Konrad Dolores, MD as Consulting Physician (Endocrinology) Kalman Shan, MD as Consulting Physician (Pulmonary Disease)  This Provider for this visit: Treatment Team:  Attending Provider: Kalman Shan, MD    12/07/2021 -   Chief Complaint  Patient presents with   Follow-up    PFT performed 12/12. Pt states she began Esbriet November 2022 and said that she has had different side effects from the med.    HPI Leliana Sehnert 72 y.o. -at last visit we started pirfenidone but as soon as she went up to 2 pills 3 times daily she started having significant side effects including drop in urine output therefore she stopped.  Most of the side effects are GI.  Present even at 1 pill 3 times daily.  She says stopped pirfenidone yesterday and ever since then beginning to feel better especially today.  Continues on oxygen at the same level.  Is on inhaled treprostinil nebulizer.  Currently increased it to 6 times at 4 times a day.  Slowly going to work her self up.  She held off on escalating this while she was on pirfenidone start.  We looked at her file-per registry but need clarification from radiology about whether she has traction bronchiectasis.  In addition need to know the current status of her polycythemia.  Last CT scan of the chest was in 2021.  We discussed about the fact that her ILD is progressing.  She is lives alone.  No  siblings in the city parents are deceased.  No children.  She has neighbors.  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  She says she regularly updates him.  We did not discuss CODE STATUS.  Offered home palliative care and she is willing.  She knows that she needs to get medical alert.       OV 02/01/2022  Subjective:  Patient ID: Patty [name], female , DOB: 1951-07-14 , age 47 y.o. , MRN: [id number] , ADDRESS: [address] [place] [address] PCP Willow Ora, MD Patient Care Team: Willow Ora, MD as PCP - General (Family Medicine) Wendall Stade, MD as PCP - Cardiology (Cardiology) Pyrtle, Carie Caddy, MD as Consulting Physician (Gastroenterology) [facility], Konrad Dolores, MD as Consulting Physician (Endocrinology) Kalman Shan, MD as Consulting Physician (Pulmonary Disease)  This Provider for this visit: Treatment Team:  Attending Provider: Kalman Shan, MD    02/01/2022 -   Chief Complaint  Patient presents with   Follow-up    Pt states she is about the same since last visit.    HPI Zoeyjane Holstad 72 y.o. -returns for follow-up.  She tells me the inhaler presently is working really well for her.  She feels that given the Crohn's disease is better.  Her exercise tolerance on the treadmill is better.  Is increased to 2.5 METS.  Her shortness of breath score is also improved.  There is normal sewage exposure in the house.  She feels her lungs are improving.  She had a high-resolution CT scan of the chest.  Dr. Fredirick Lathe the radiologist says there is ILA and not ILD.  She [as well as myself] no perplexed by this.  We looked at the CT scans ourselves.  In my view this may be early ILD.  It might be slightly worse in 2018.  Looking at lung function test status greater than 15% total decline in 3 years between 2019 and 2022.  However we do agree that the interstitial findings on the CT scan are mild.  Also she does not feel that she has significant emphysema.  She feels the CT report is over waiting the emphysema.  She says even  though she smoked it was not a true pack per day.  She says the Spiriva did not help her.  Therefore she is a little bit perplexed with the emphysema diagnosis on the CT scan.  She and I agreed that we will discuss in a case conference.  I did submit her name for this.  At this point in time she is not on antifibrotic's and prefers not to be on it.  She is content being on treprostinil.     CT Chest data  CT Chest High Resolution  Result Date: 01/31/2022 CLINICAL DATA:  Interstitial lung disease, former smoker. EXAM: CT CHEST WITHOUT CONTRAST TECHNIQUE: Multidetector CT imaging of the chest was performed following the standard protocol without intravenous contrast. High resolution imaging of the lungs, as well as inspiratory and expiratory imaging, was performed. RADIATION DOSE REDUCTION: This exam was performed according to the departmental dose-optimization program which includes automated exposure control, adjustment of the mA and/or kV according to patient size and/or use of iterative reconstruction technique. COMPARISON:  07/10/2020. FINDINGS: Cardiovascular: Atherosclerotic calcification of the aorta, aortic valve and coronary arteries. Pulmonic trunk is enlarged. Heart size within normal limits. No pericardial effusion. Mediastinum/Nodes: No pathologically enlarged mediastinal or axillary lymph nodes. Hilar regions are difficult  to definitively evaluate without IV contrast but appear grossly unremarkable. Esophagus is grossly unremarkable. Lungs/Pleura: Centrilobular emphysema. Negative for subpleural reticulation, traction bronchiectasis/bronchiolectasis, ground-glass, architectural distortion or honeycombing. Subpleural densities and ground-glass seen on supine imaging largely clear on prone imaging. Mild bibasilar scarring and bronchiectasis. No pleural fluid. Airway is unremarkable. No air trapping. Upper Abdomen: Liver margin is slightly irregular. Visualized portions of the liver, gallbladder,  adrenal glands and right kidney are otherwise unremarkable. 1.4 cm exophytic low-attenuation lesion off the interpolar left kidney is likely a cyst. Visualized portions of the spleen, pancreas, stomach and bowel are grossly. Scattered surgical clips in the abdomen. Upper abdominal lymph nodes are not enlarged by CT size criteria. Musculoskeletal: Degenerative changes in the spine. No worrisome lytic or sclerotic lesions. IMPRESSION: 1. Favor absence of interstitial lung disease based on prone imaging. 2. Bibasilar scarring and mild bronchiectasis. 3. Marginal irregularity the liver is indicative of cirrhosis. 4. Aortic atherosclerosis (ICD10-I70.0). Coronary artery calcification. 5.  Emphysema (ICD10-J43.9). 6. Enlarged pulmonic trunk indicative pulmonary arterial hypertension. Electronically Signed   By: Leanna Battles M.D.   On: 01/31/2022 11:53         OV 08/19/2022  Subjective:  Patient ID: Patty [name], female , DOB: 07-22-1951 , age 1 y.o. , MRN: [id number] , ADDRESS: [address] [place] [address] PCP Willow Ora, MD Patient Care Team: Willow Ora, MD as PCP - General (Family Medicine) Wendall Stade, MD as PCP - Cardiology (Cardiology) Pyrtle, Carie Caddy, MD as Consulting Physician (Gastroenterology) [facility], Konrad Dolores, MD as Consulting Physician (Endocrinology) Kalman Shan, MD as Consulting Physician (Pulmonary Disease)  This Provider for this visit: Treatment Team:  Attending Provider: Kalman Shan, MD  Type of visit: Video Circumstance: patietn preferene Identification of patient Mikayela Mentzel with 09-Jul-1951 and MRN [id number] - 2 person identifier Risks: Risks, benefits, limitations of telephone visit explained. Patient understood and verbalized agreement to proceed Anyone else on call: jsut patient Patient location: her home This provider location: [address]; [place]; [place] [address]. [facility]. [phone]   08/19/2022 -  Followup above via video    HPI Jaque Dillahunt 72 y.o. -overall stable but maybe some more tired than usual. Goes to gym and works out treadmill (!5 min), elliptical 2-3h/week. Walks mostly in stores where she does better. Trying to lose weight- have not lose weight . She has not tried medications for weight loss yet but worried in the settling of Crohns she is worried about side effect. STill on Tyvaso - 9 puff 4 times per day. Did  echo yesterday - gr1 ddx and very small pericardia effusion  per rreport. Had repeat CT April 52841 -> they are saying no ILD again. But she is on tyvaso and helping her dyspnea. She feels tyvaso since 2022 and church controlling sewage overflows in sept 2022 and crohns being in remission has helped   RE sewage exposure  -is the nearby church behind her house and there ws intermittent exposure. She feels sewage exposure made her worse. She has an attorney  Using o2 6L [place] with exertion. At rest  Today room air at rest - 96% and HR 77%   CT chest HRCT- Apirl 2023  IMPRESSION: 1. Mild, bland appearing bandlike scarring of the bilateral lung bases, unchanged compared to recent prior examination. No evidence of fibrotic interstitial lung disease. 2. Coronary artery disease. 3. Enlargement of the main pulmonary artery, as can be  seen in pulmonary hypertension. 4. Coarse, nodular contour of the partially imaged liver in the upper abdomen, suggesting cirrhosis.   Aortic Atherosclerosis (ICD10-I70.0).     Electronically Signed   By: Jearld Lesch M.D.   On: 03/31/2022 10:16     OV 09/22/2022  Subjective:  Patient ID: Patty [name], female , DOB: [birth year] , age 77 y.o. , MRN: [id number] , ADDRESS: [address] [place] [address] PCP Willow Ora, MD Patient Care Team: Willow Ora, MD as PCP - General (Family Medicine) Wendall Stade, MD as PCP - Cardiology (Cardiology) Pyrtle, Carie Caddy,  MD as Consulting Physician (Gastroenterology) [facility], Konrad Dolores, MD as Consulting Physician (Endocrinology) Kalman Shan, MD as Consulting Physician (Pulmonary Disease)  This Provider for this visit: Treatment Team:  Attending Provider: Kalman Shan, MD   09/22/2022 -   Chief Complaint  Patient presents with   Follow-up    PFT performed today.  Pt states she has had good and bad days with her breathing especially when the weather changes it becomes affected.     HPI Jabrea Gusky 72 y.o. -returns for follow-up.  Overall she feels she is doing stable but her lung function shows continued decline.  She says since last year there is no further sewage exposure in the church behind her house.  She says the way they repaired it she is worried that there may be future risk of exposure.  It is great life charge on [employer].  The pastor that is Josiah Lobo.  She is upset about the situation.  She is worried that the significant sewage exposure last year has continued to contribute to her lung damage.  There is a concept called hit-and-run hypothesis.  Currently she is not smoking.  But pulmonary function test shows continued decline alpha-1 antitrypsin is normal.  She feels the Tyvaso is helping her.  She is up-to-date with the COVID and flu shot this season.  Discussed RSV vaccine and she will have that.  She continues CPAP for sleep apnea.  Of note previous CT scan has reported emphysema.  She states.  I did not help her in the past but she is willing to try sample Trelegy.    OV 12/29/2022  Subjective:  Patient ID: Patty [name], female , DOB: 1951-02-01 , age 21 y.o. , MRN: [id number] , ADDRESS: [address] [place] [address] PCP Willow Ora, MD Patient Care Team: Willow Ora, MD as PCP - General (Family Medicine) Wendall Stade, MD as PCP - Cardiology (Cardiology) Pyrtle, Carie Caddy, MD as Consulting Physician (Gastroenterology) [facility],  Konrad Dolores, MD as Consulting Physician (Endocrinology) Kalman Shan, MD as Consulting Physician (Pulmonary Disease)  This Provider for this visit: Treatment Team:  Attending Provider: Kalman Shan, MD    12/29/2022 -   Chief Complaint  Patient presents with   Follow-up    Pulled muscles behind right knee area.  Unable to very active during August, Sept, Oct and first part of November 2023.  Oxygen drops with exertion while using  6 lpm pulsed oxygen.  Trelegy did not improve breathing, took both samples and stopped.  HPI Lidya Lipko 72 y.o. -returns for follow-up.  Since her last visit there is no further sewage exposure although the sewage drain itself is not fixed.  She has had difficulty getting an attorney.  She did get pulmonary function test recently and shows continued stability in both FVC and DLCO since September  2022 which was the last time.  Of sewage exposure.  It is definitely progressive sieve since February 2022 and 2021.  Symptoms course recently is stable.  She says she is improved her effort tolerance and is able to do 2.7 METS exercise.  She says recently she overdid her exercise and in the summer 2023 around August she pulled her right hamstring.  After that she was given some muscle relaxant that made her confused and had side effects.  Around the same time she was also on Trelegy and she not sure if some of the side effects were from Trelegy.  She did get tachycardic with Trelegy.  Therefore she does not want to do this anymore.  Last month when she had pulmonary function test albuterol seem to help her so she just wants albuterol for rescue and she did not want any long-acting beta agonist.  Previously we tried Spiriva and she does not want to try this again.  She is up-to-date with her vaccines.  Overall she is feeling stable using 2 L at rest and 6 L with exertion.  Symptom score also shows stability.     OV 04/06/2023  Subjective:  Patient ID: Patty [name], female , DOB: 05/16/1951 , age 24 y.o. , MRN: [id number] , ADDRESS: [address] [place] [address] PCP Willow Ora, MD Patient Care Team: Willow Ora, MD as PCP - General (Family Medicine) Wendall Stade, MD as PCP - Cardiology (Cardiology) Pyrtle, Carie Caddy, MD as Consulting Physician (Gastroenterology) [facility], Konrad Dolores, MD as Consulting Physician (Endocrinology) Kalman Shan, MD as Consulting Physician (Pulmonary Disease)  This Provider for this visit: Treatment Team:  Attending Provider: Kalman Shan, MD  04/06/2023 -   Chief Complaint  Patient presents with   Follow-up    Pressure sores ??      HPI Kenyon Menges 72 y.o. -returns for her WHO group 3 pulm hypertension associated with emphysema [previous CT reading has ruled out ILD].  It was previously determined that she does not have ILD.  She is tolerating her Tyvaso fine.  She is feeling stable.  Walking desaturation test was stable.  She reports easy tachycardia.  She has seen Dr. Eden Emms  for this and she has been reassured.  We discussed doing beta-blockers but she is not interested.  She notes tachycardia when she is on the treadmill with her oxygen on.  The heart rate goes around to 130-135.  Her age-predicted max is around 120/min.  She is not necessarily symptomatic.  I did indicate to her that as long as it is 15 130-135 and she is asymptomatic she can continue to monitor.  Discussed physiology of heart rate response.  She continues a CPAP at night with oxygen.  She continues albuterol as needed.  Her last CT scan of the chest was in April 2023.  She is a former 45 pack smoker  Of note she still has not had any further sewage exposure from the neighbor since 2022.  Her last pulmonary function test December 2023 showed continued stability although it is worse compared to 2021 and early 2022 [preexposure].  She is still considering her legal options.        OV  10/25/2023  Subjective:  Patient ID: Patty [name], female , DOB: [birth year] , age 44 y.o. , MRN: [id number] , ADDRESS: [address] [place] [address] PCP Willow Ora, MD Patient Care Team: Willow Ora, MD as PCP -  General (Family Medicine) Wendall Stade, MD as PCP - Cardiology (Cardiology) Pyrtle, Carie Caddy, MD as Consulting Physician (Gastroenterology) [facility], Konrad Dolores, MD as Consulting Physician (Endocrinology) Kalman Shan, MD as Consulting Physician (Pulmonary Disease)  This Provider for this visit: Treatment Team:  Attending Provider: Kalman Shan, MD    10/25/2023 -   Chief Complaint  Patient presents with   Follow-up     Chronic hypoxemic respiratory failure due to all of the below ILD (interstitial lung disease) (HCC) - 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 - diagnosed Sept 2022 (in Feb a2023 and April 2023 they are saying no ILD and she feels is because crohns is under remission, tyvaso definitely since sep 2022, and resolution nearbyhome sewage exposure may 2022 - sept 2022) -Non-IPF likely - such as NSIP -  Decline PF March 2020 through 02/22/21 but stable October 2020 through 02-22-2021 -> declined sept 2022 PFT -Stable on CT scan of the chest 2018 through July 2021  -You are using room air at rest with 3-5 L with exertion -pulse oxygen for the last few months - started esbriet  oct/nov 2022 - stopped GI intolerance 11/29/21    WHO group 3 pulmonary arterial hypertension (HCC) with elevaed PCWP to 20 on Dec 2021 - started tyvaso July 2022 -> stopped sept 2022 of concern PFT declined could ave been due to tyvaso -> but restarted later in sept 2022   WHO group 3 pulmonary arterial hypertension (HCC) with elevaed PCWP to 20 on Dec 2021 - -Continue CPAP at ngith with 2L  at   Obestuity - Morbid Crohns Polycythemua Former 45 pack smoker   HPI Danaye Youngren 72 y.o. -returns for follow-up.  She was  supposed have pulmonary function test today but they scheduled with the [facility] location with the visit in the [facility] location.  She felt she did not have sufficient time to turn around and get to the clinic visit on time.  She also told me that the appointment was scheduled for 4 PM although in my computer short of his 3:30 PM.  In any event she feels she is up and down stable symptoms with the weather pulmonary wise overall she is stable.  She did have a high-resolution CT chest.  There is no pneumonia or cancer.  However there is mild UIP that is according to radiology progressive over time.  She has several other issues that she brought up with me - She is in litigation with her neighbor the church property which seem to sewage water that made her lung function decline. - Her cat died in 2024/02/28from neurotoxicity which she believes is from the sewage water - Recently she been having leg cramps and she saw physical therapy which then made pain in her right lower extremity go up.  Apparently she has had neuro eval and is also concerned there is neurotoxicity.  Or neuropathy.  She believes all this might be related to the switch and the cat - She has been diagnosed with left eye wet macular degeneration and she tried to have a stent but had tolerance problems.   On walking desaturation test today she desaturated quarter lap.;  This appears to be slowly progressive. She is tolerating inhaled treprostinil   SYMPTOM SCALE -  03/12/2019  10/07/2019 30# wt gain  06/23/2020 250# 09/07/2021 266# - tyvaso since July 2022 12/07/2021 272# 02/01/2022 tyvaso slightly so much better 09/22/2022  12/29/2022  04/06/2023   O2 use  RA ra   2-6L 2-6L 2-6L 2-6L  2-6L   Shortness of Breath 0 -> 5 scale with 5 being worst (score 6 If unable to do)   0 0      At rest 0 0 0 0 with o2 0 0 0 0 2  Simple tasks - showers, clothes change, eating, shaving 0 0  0 0 1 1 1.5 2  Household (dishes, doing bed,  laundry) 0 0 0 0 4 1 4  1.5 3.5  Shopping 0 0 0 0 1 0 0 0 0  Walking at own pace 0 4.5 4.5 3 4 1  1.5 0 2  Walking up Stairs 1-2 5 5 3 5 3 6  4.5 5  Total (40 - 48) Dyspnea Score 2 9.5 9.5 6 al with o2 14 6 13.5 7.5 15.4  How bad is your cough? 0 0 0 0 0 At the time of onset of0 0 0 0  How bad is your fatigue 0 1 0 2 3 1 4  0 2  nausea   0 0 0 0 0 0 0  vomit   0 0 0 0 0 0 0  diarrhe   0 2 3 1  0 0 0  axniety   3 2 2 1 6 2  2.5  depression   0 1 1 0 0 0 0        Simple office walk 185 feet x  3 laps goal with forehead probe 03/12/2019  10/07/2019  06/23/2020  09/07/2021  04/06/2023  10/25/2023   O2 used Room air Room air Room air ra ra ra  Number laps completed 3 2nd lap 1 lap 1 lap 1/2 laps 1/4  Comments about pace slow slow slow     Resting Pulse Ox/HR 100% and 85/min 96% and 72/min 97% and 77 97% and 76 90% and HR 93 91% and 112/min  Final Pulse Ox/HR 88% and 135/min 86% and 105/min 87% and 112/min 83 and 112/min 83% ad HR 111 86% in 1/4 llap and 111  Desaturated </= 88% yes       Desaturated <= 3% points Yes, 12       Got Tachycardic >/= 90/min yes       Symptoms at end of test Mild dyspnea  Corrected with 5L [place]. Even with 4L [place] downt to  87% Corrected with 6LNC at 94$ for antoher 2lpas    Miscellaneous comments x  ? worse       CT Chest data from date: HRCT 10/10/23 as below Narrative & Impression  CLINICAL DATA:  72 year old female with history of chronic respiratory hypoxia. Former smoker.   EXAM: CT CHEST WITHOUT CONTRAST   TECHNIQUE: Multidetector CT imaging of the chest was performed following the standard protocol without intravenous contrast. High resolution imaging of the lungs, as well as inspiratory and expiratory imaging, was performed.   RADIATION DOSE REDUCTION: This exam was performed according to the departmental dose-optimization program which includes automated exposure control, adjustment of the mA and/or kV according to patient size and/or use of  iterative reconstruction technique.   COMPARISON:  High-resolution chest CT 03/30/2022.   FINDINGS: Cardiovascular: Heart size is normal. There is no significant pericardial fluid, thickening or pericardial calcification. There is aortic atherosclerosis, as well as atherosclerosis of the great vessels of the mediastinum and the coronary arteries, including calcified atherosclerotic plaque in the left anterior descending, left circumflex and right coronary arteries.   Mediastinum/Nodes: No pathologically enlarged mediastinal or hilar lymph nodes.  Please note that accurate exclusion of hilar adenopathy is limited on noncontrast CT scans. Esophagus is unremarkable in appearance.   Lungs/Pleura: High-resolution images demonstrates some patchy peripheral predominant areas of ground-glass attenuation, scattered areas of mild septal thickening, occasional subpleural reticulation, as well as some scattered areas of mild cylindrical bronchiectasis and peripheral bronchiolectasis. These findings have a mild craniocaudal gradient and appear slightly progressive compared to prior studies. Inspiratory and expiratory imaging is unremarkable. No acute consolidative airspace disease. No pleural effusions. No definite suspicious appearing pulmonary nodules or masses are noted.   Upper Abdomen: Nodular contour of the liver suggesting underlying cirrhosis.   Musculoskeletal: There are no aggressive appearing lytic or blastic lesions noted in the visualized portions of the skeleton.   IMPRESSION: 1. Subtle fibrotic changes in the lungs which appear minimally progressive compared to the prior study with an overall spectrum of findings considered probable usual interstitial pneumonia (UIP) per current ATS guidelines. At this time, the severity of the disease appears very mild, however, repeat high-resolution chest CT is suggested in 12 months to assess for temporal changes in the appearance of the  lung parenchyma. 2. Aortic atherosclerosis, in addition to three-vessel coronary artery disease. Please note that although the presence of coronary artery calcium documents the presence of coronary artery disease, the severity of this disease and any potential stenosis cannot be assessed on this non-gated CT examination. Assessment for potential risk factor modification, dietary therapy or pharmacologic therapy may be warranted, if clinically indicated. 3. Morphologic changes in the liver suggesting cirrhosis.   Aortic Atherosclerosis (ICD10-I70.0).     Electronically Signed   By: Trudie Reed M.D.   On: 10/23/2023 18:25    PFT     Latest Ref Rng & Units 12/23/2022    1:51 PM 09/22/2022    2:27 PM 12/06/2021   12:57 PM 09/07/2021   12:54 PM 02/22/2021   11:52 AM 06/09/2020   11:25 AM 10/01/2019    3:58 PM  ILD indicators  FVC-Pre L 2.28  2.16  2.32  2.28  2.38  2.42  2.38   FVC-Predicted Pre % 80  76  80  79  81  82  81   FVC-Post L 2.41         FVC-Predicted Post % 85         TLC L 5.08         TLC Predicted % 102         DLCO uncorrected ml/min/mmHg 12.06  10.92  11.82  11.94  15.05  13.48  14.39   DLCO UNC %Pred % 64  57  62  63  79  70  75   DLCO Corrected ml/min/mmHg 11.85  10.61  11.45  11.94  27.44  12.89    DLCO COR %Pred % 62  56  60  63  144  67        LAB RESULTS last 96 hours No results found.  LAB RESULTS last 90 days No results found for this or any previous visit (from the past 2160 hour(s)).       has a past medical history of Abnormal nuclear stress test, Anxiety, Aortic atherosclerosis (HCC), Arthritis, Chronic diastolic CHF (congestive heart failure) (HCC), Coronary artery calcification seen on CT scan, Crohn disease (HCC), DVT (deep venous thrombosis) (HCC), Dyspnea, Grade I diastolic dysfunction, Hyperlipidemia, ILD (interstitial lung disease) (HCC), OSA on CPAP, Pernicious anemia, Polycythemia vera (HCC), Pulmonary embolism (HCC) (2010),  Pulmonary hypertension (HCC), and Sleep disorder (  01/10/2020).   reports that she quit smoking about 14 years ago. Her smoking use included cigarettes. She started smoking about 59 years ago. She has a 45 pack-year smoking history. She has never used smokeless tobacco.  Past Surgical History:  Procedure Laterality Date    RIGHT BREAST LUMPECTOMY WITH RADIOACTIVE SEED LOCALIZATION (Right Breast)  02/04/2021   BIOPSY  09/30/2019   Procedure: BIOPSY;  Surgeon: Beverley Fiedler, MD;  Location: [facility];  Service: Gastroenterology;;   BREAST LUMPECTOMY WITH RADIOACTIVE SEED LOCALIZATION Right 02/04/2021   Procedure: RIGHT BREAST LUMPECTOMY WITH RADIOACTIVE SEED LOCALIZATION;  Surgeon: Abigail Miyamoto, MD;  Location: [facility] OR;  Service: General;  Laterality: Right;   CARDIAC CATHETERIZATION     COLON RESECTION  1993   COLONOSCOPY WITH PROPOFOL N/A 09/30/2019   Procedure: COLONOSCOPY WITH PROPOFOL;  Surgeon: Beverley Fiedler, MD;  Location: [facility];  Service: Gastroenterology;  Laterality: N/A;   HIP ARTHROPLASTY Right    x 2, initial right THA, then subsequent right THA revision    LEFT HEART CATH AND CORONARY ANGIOGRAPHY N/A 07/28/2017   Procedure: Left Heart Cath and Coronary Angiography;  Surgeon: Kathleene Hazel, MD;  Location: [facility] INVASIVE CV LAB;  Service: Cardiovascular;  Laterality: N/A;   POLYPECTOMY  09/30/2019   Procedure: POLYPECTOMY;  Surgeon: Beverley Fiedler, MD;  Location: [facility];  Service: Gastroenterology;;   RIGHT HEART CATH N/A 12/07/2020   Procedure: RIGHT HEART CATH;  Surgeon: Tonny Bollman, MD;  Location: [facility] INVASIVE CV LAB;  Service: Cardiovascular;  Laterality: N/A;    Allergies  Allergen Reactions   Tizanidine Hcl Shortness Of Breath   Victoza [Liraglutide] Shortness Of Breath   Metformin And Related Diarrhea    Increased moodiness   Onion Other (See Comments)    sereve diarrhea, stomach pains (Crohn's flares)   Lasix [Furosemide] Itching     Hypersensitivity - pt can take as needed    Pirfenidone Other (See Comments)    GI side effects   Potassium-Containing Compounds Other (See Comments)    Due to chron's, difficult passing though kidneys not allowing absorption in the body    Prednisone Other (See Comments)    Altered mood   Sulfa Drugs Cross Reactors Other (See Comments)    "feels like bugs crawling on me"   Trelegy Ellipta [Fluticasone-Umeclidin-Vilant] Palpitations    Immunization History  Administered Date(s) Administered   Fluad Quad(high Dose 65+) 10/07/2019, 09/09/2020, 11/27/2021, 09/19/2022   Fluad Trivalent(High Dose 65+) 09/25/2023   Influenza, High Dose Seasonal PF 08/28/2018   Influenza-Unspecified 11/23/2017   PFIZER Comirnaty(Gray Top)Covid-19 Tri-Sucrose Vaccine 09/19/2022   PFIZER(Purple Top)SARS-COV-2 Vaccination 02/16/2020, 03/17/2020, 12/14/2020, 05/14/2021, 10/27/2021   Pneumococcal Conjugate-13 02/13/2018   Pneumococcal Polysaccharide-23 03/12/2019   Respiratory Syncytial Virus Vaccine,Recomb Aduvanted(Arexvy) 09/24/2022    Family History  Problem Relation Age of Onset   Glaucoma Mother    High blood pressure Mother    High Cholesterol Mother    Sleep apnea Mother    Diabetes Mellitus II Father    Sleep apnea Father    Anxiety disorder Father    COPD Brother    Heart disease Brother    Non-Hodgkin's lymphoma Brother    Diabetes Mellitus II Brother    Glaucoma Brother    Colon cancer Neg Hx    Esophageal cancer Neg Hx    Pancreatic cancer Neg Hx    Stomach cancer Neg Hx    Liver disease Neg Hx      Current Outpatient Medications:  albuterol (VENTOLIN HFA) 108 (90 Base) MCG/ACT inhaler, Inhale 2 puffs into the lungs every 6 (six) hours as needed for wheezing or shortness of breath., Disp: 8 g, Rfl: 6   amoxicillin (AMOXIL) 500 MG capsule, Take 4 capsules (2,000 mg total) by mouth once as needed for up to 1 dose. Before dental procedures, Disp: 20 capsule, Rfl: 1   aspirin EC 81 MG  tablet, Take 81 mg by mouth daily., Disp: , Rfl:    bevacizumab (AVASTIN) 1.25 mg/0.1 mL SOLN, 1.25 mg by Intravitreal route every 30 (thirty) days., Disp: , Rfl:    Calcium Carbonate-Vit D-Min (CALCIUM 1200) 1200-1000 MG-UNIT CHEW, Chew 1 tablet by mouth daily., Disp: , Rfl:    CANNABIDIOL PO, Take 1 Dose by mouth 2 (two) times daily as needed (anxiety/sleep.). CBD Oil, Disp: , Rfl:    cetirizine (ZYRTEC) 10 MG tablet, Take 10 mg by mouth daily as needed (sinus/allergies.)., Disp: , Rfl:    cetirizine-pseudoephedrine (ZYRTEC-D) 5-120 MG tablet, Take 1 tablet by mouth daily as needed (sinus headaches.)., Disp: , Rfl:    clonazePAM (KLONOPIN) 0.5 MG tablet, TAKE 1/2 TO 1 TABLET(0.25 TO 0.5 MG) BY MOUTH DAILY AS NEEDED FOR ANXIETY, Disp: 90 tablet, Rfl: 1   cyanocobalamin (VITAMIN B12) 1000 MCG/ML injection, Inject 1 mL (1,000 mcg total) into the muscle every 30 (thirty) days., Disp: 10 mL, Rfl: 1   FLUoxetine (PROZAC) 20 MG capsule, Take 1 capsule (20 mg total) by mouth daily., Disp: 90 capsule, Rfl: 3   fluticasone (FLONASE) 50 MCG/ACT nasal spray, Place 1-2 sprays into both nostrils daily as needed for allergies or rhinitis., Disp: , Rfl:    Fluticasone-Umeclidin-Vilant (TRELEGY ELLIPTA) 100-62.5-25 MCG/ACT AEPB, Inhale 1 puff into the lungs daily., Disp: 14 each, Rfl: 0   furosemide (LASIX) 20 MG tablet, Take 1 tablet (20 mg total) by mouth as needed for fluid., Disp: 30 tablet, Rfl: 11   Glycerin-Polysorbate 80 (REFRESH DRY EYE THERAPY OP), Place 1 drop into both eyes daily., Disp: , Rfl:    hydrocortisone (PROCTOZONE-HC) 2.5 % rectal cream, Place 1 application rectally 2 (two) times daily as needed (Crohn's flare)., Disp: 28 g, Rfl: 2   ibuprofen (ADVIL) 200 MG tablet, Take 400-800 mg by mouth every 8 (eight) hours as needed (pain.)., Disp: , Rfl:    mesalamine (PENTASA) 250 MG CR capsule, Take 250-500 mg by mouth 4 (four) times daily as needed (crohn's disease flare ups)., Disp: , Rfl:     Multiple Vitamins-Minerals (ICAPS AREDS 2 PO), Take 1 tablet by mouth daily., Disp: , Rfl:    potassium chloride (MICRO-K) 10 MEQ CR capsule, Take 1 capsule (10 mEq total) by mouth daily as needed (when taking furosemide)., Disp: 30 capsule, Rfl: 3   prednisoLONE acetate (PRED FORTE) 1 % ophthalmic suspension, Place 1 drop into both eyes 2 (two) times daily as needed (Uveitis)., Disp: , Rfl:    Probiotic Product (PROBIOTIC PO), Take 1 capsule by mouth 2 (two) times a week. As needed, Disp: , Rfl:    SYRINGE-NEEDLE, DISP, 3 ML (LUER LOCK SAFETY SYRINGES) 25G X 1" 3 ML MISC, USE AS DIRECTED, Disp: 100 each, Rfl: 5   traMADol (ULTRAM) 50 MG tablet, Take 1 tablet (50 mg total) by mouth every 6 (six) hours as needed., Disp: 20 tablet, Rfl: 0   traZODone (DESYREL) 50 MG tablet, TAKE 0.5-1 TABLETS BY MOUTH AT BEDTIME AS NEEDED FOR SLEEP., Disp: 90 tablet, Rfl: 3   Treprostinil (TYVASO REFILL) 0.6 MG/ML SOLN, Inhale  54 mcg into the lungs in the morning, at noon, in the evening, and at bedtime., Disp: 87 mL, Rfl: 11      Objective:   Vitals:   10/25/23 1544  BP: 135/76  Pulse: (!) 101  Temp: 98.3 F (36.8 C)  TempSrc: Oral  SpO2: 96%  Weight: 268 lb 6.4 oz (121.7 kg)  Height: 5\' 3"  (1.6 m)    Estimated body mass index is 47.54 kg/m as calculated from the following:   Height as of this encounter: 5\' 3"  (1.6 m).   Weight as of this encounter: 268 lb 6.4 oz (121.7 kg).  @WEIGHTCHANGE @  [employer]   10/25/23 1544  Weight: 268 lb 6.4 oz (121.7 kg)     Physical Exam   General: No distress. obese O2 at rest: YES Cane present: no Sitting in wheel chair: no Frail: no Obese: YES Neuro: Alert and Oriented x 3. GCS 15. Speech normal Psych: Pleasant Resp:  Barrel Chest - no.  Wheeze - no, Crackles - no, No overt respiratory distress CVS: Normal heart sounds. Murmurs - no Ext: Stigmata of Connective Tissue Disease - no HEENT: Normal upper airway. PEERL +. No post nasal drip         Assessment:       ICD-10-CM   1. ILD (interstitial lung disease) (HCC)  J84.9 Pulmonary function test         Plan:     Patient Instructions  Chronic hypoxemic respiratory failure  ILD (interstitial lung disease) (HCC)  WHO group 3 pulmonary arterial hypertension (HCC) with elevaed PCWP to 20 on Dec 2021 Former heavy smoker   Pulmonary fibrosis  burden appers slightly worse over time but currently 10/25/2023 no flare up - PFT not done due to schedulig glitches at our office; our apologies   Plan - continue albuterol MDI 2 puff as needed for emergency - continue tyvaso at curent dose --Continue oxygen 3-6 L nasal cannula pulsed with exertion and at night 2 L nasal cannula and room air at rest -Continue exercise program  -However continue to keep her pulse ox at greater than or equal to 88% using a pulse oximeter --Make attempts to lose weight - talk to your PCP Willow Ora, MD abut weight loss drugs -DO  spirometry and dlco in 6 months   ````````````````````````````````````````````````````````````````````` OSA on CPAP  Stable  Plan  -Continue CPAP at ngith with 2L [place] at night  per the advice of his sleep doctor   Associated emphysema on CT -feb 2023 Normal Alpha 1 AT  stable  Plan  -prescribe albluterol 2 puff as needed - continue trelegy schedule   Sinus Tachycardia  -This happens with exertion and is likely related to weight, physical deconditioning and also hypoxemia -Age-predicted minute maximal heart rate is around 120/min  Plan -As long as you are feeling asymptomatic okay to let your heart rate go to 130-135/min -There is a new medicine called IVARBRADINE that might help this    - talk to cardiology  Follow-up  -6  months s face to face with Dr Marchelle Gearing; 15 min visit  = symptom score and walk test at followupo   -   FOLLOWUP Return in about 6 months (around 04/24/2024) for 15 min visit, Chronic Respiratory Failure, Face to Face  Visit.    SIGNATURE    Dr. Kalman Shan, M.D., F.C.C.P,  Pulmonary and Critical Care Medicine Staff Physician, [facility] Director - Interstitial Lung Disease  Program  Pulmonary Fibrosis  [facility] [place], [place], [address]  Pager: [phone], If no answer or between  15:00h - 7:00h: call [phone] Telephone: [phone]  5:05 PM 10/25/2023   Moderate Complexity MDM OFFICE  2021 E/M guidelines, first released in 2021, with minor revisions added in 2023 and 2024 Must meet the requirements for 2 out of 3 dimensions to qualify.    Number and complexity of problems addressed Amount and/or complexity of data reviewed Risk of complications and/or morbidity  One or more chronic illness with mild exacerbation, OR progression, OR  side effects of treatment  Two or more stable chronic illnesses  One undiagnosed new problem with uncertain prognosis  One acute illness with systemic symptoms   One Acute complicated injury Must meet the requirements for 1 of 3 of the categories)  Category 1: Tests and documents, historian  Any combination of 3 of the following:  Assessment requiring an independent historian  Review of prior external note(s) from each unique source  Review of results of each unique test  Ordering of each unique test    Category 2: Interpretation of tests   Independent interpretation of a test performed by another physician/other qualified health care professional (not separately reported)  Category 3: Discuss management/tests  Discussion of management or test interpretation with external physician/other qualified health care professional/appropriate source (not separately reported) Moderate risk of morbidity from additional diagnostic testing or treatment Examples only:  Prescription drug management  Decision regarding minor surgery with identfied patient or procedure risk  factors  Decision regarding elective major surgery without identified patient or procedure risk factors  Diagnosis or treatment significantly limited by social determinants of health             HIGh Complexity  OFFICE   2021 E/M guidelines, first released in 2021, with minor revisions added in 2023. Must meet the requirements for 2 out of 3 dimensions to qualify.    Number and complexity of problems addressed Amount and/or complexity of data reviewed Risk of complications and/or morbidity  Severe exacerbation of chronic illness  Acute or chronic illnesses that may pose a threat to life or bodily function, e.g., multiple trauma, acute MI, pulmonary embolus, severe respiratory distress, progressive rheumatoid arthritis, psychiatric illness with potential threat to self or others, peritonitis, acute renal failure, abrupt change in neurological status Must meet the requirements for 2 of 3 of the categories)  Category 1: Tests and documents, historian  Any combination of 3 of the following:  Assessment requiring an independent historian  Review of prior external note(s) from each unique source  Review of results of each unique test  Ordering of each unique test    Category 2: Interpretation of tests    Independent interpretation of a test performed by another physician/other qualified health care professional (not separately reported)  Category 3: Discuss management/tests  Discussion of management or test interpretation with external physician/other qualified health care professional/appropriate source (not separately reported)  HIGH risk of morbidity from additional diagnostic testing or treatment Examples only:  Drug therapy requiring intensive monitoring for toxicity  Decision for elective major surgery with identified pateint or procedure risk factors  Decision regarding hospitalization or escalation of level of care  Decision for DNR or to de-escalate  care   Parenteral controlled  substances            LEGEND - Independent interpretation involves the interpretation of a test for which there  is a CPT code, and an interpretation or report is customary. When a review and interpretation of a test is performed and documented by the provider, but not separately reported (billed), then this would represent an independent interpretation. This report does not need to conform to the usual standards of a complete report of the test. This does not include interpretation of tests that do not have formal reports such as a complete blood count with differential and blood cultures. Examples would include reviewing a chest radiograph and documenting in the medical record an interpretation, but not separately reporting (billing) the interpretation of the chest radiograph.   An appropriate source includes professionals who are not health care professionals but may be involved in the management of the patient, such as a Clinical research associate, upper officer, case manager or teacher, and does not include discussion with family or informal caregivers.    - SDOH: SDOH are the conditions in the environments where people are born, live, learn, work, play, worship, and age that affect a wide range of health, functioning, and quality-of-life outcomes and risks. (e.g., housing, food insecurity, transportation, etc.). SDOH-related Z codes ranging from Z55-Z65 are the ICD-10-CM diagnosis codes used to document SDOH data Z55 - Problems related to education and literacy Z56 - Problems related to employment and unemployment Z57 - Occupational exposure to risk factors Z58 - Problems related to physical environment Z59 - Problems related to housing and economic circumstances [phone] - Problems related to social environment [phone] - Problems related to upbringing [phone] - Other problems related to primary support group, including family circumstances Z14 - Problems related to certain  psychosocial circumstances Z65 - Problems related to other psychosocial circumstances

## 2023-10-25 NOTE — Patient Instructions (Addendum)
Chronic hypoxemic respiratory failure  ILD (interstitial lung disease) (HCC)  WHO group 3 pulmonary arterial hypertension (HCC) with elevaed PCWP to 20 on Dec 2021 Former heavy smoker   Pulmonary fibrosis  burden appers slightly worse over time but currently 10/25/2023 no flare up - PFT not done due to schedulig glitches at our office; our apologies   Plan - continue albuterol MDI 2 puff as needed for emergency - continue tyvaso at curent dose --Continue oxygen 3-6 L nasal cannula pulsed with exertion and at night 2 L nasal cannula and room air at rest -Continue exercise program  -However continue to keep her pulse ox at greater than or equal to 88% using a pulse oximeter --Make attempts to lose weight - talk to your PCP Willow Ora, MD abut weight loss drugs -DO  spirometry and dlco in 6 months   ````````````````````````````````````````````````````````````````````` OSA on CPAP  Stable  Plan  -Continue CPAP at ngith with 2L [place] at night  per the advice of his sleep doctor   Associated emphysema on CT -feb 2023 Normal Alpha 1 AT  stable  Plan  -prescribe albluterol 2 puff as needed - continue trelegy schedule   Sinus Tachycardia  -This happens with exertion and is likely related to weight, physical deconditioning and also hypoxemia -Age-predicted minute maximal heart rate is around 120/min  Plan -As long as you are feeling asymptomatic okay to let your heart rate go to 130-135/min -There is a new medicine called IVARBRADINE that might help this    - talk to cardiology  Follow-up  -6  months s face to face with Dr Marchelle Gearing; 15 min visit  = symptom score and walk test at followupo   -

## 2023-10-26 ENCOUNTER — Encounter (INDEPENDENT_AMBULATORY_CARE_PROVIDER_SITE_OTHER): Payer: Medicare Other | Admitting: Ophthalmology

## 2023-10-26 DIAGNOSIS — H353221 Exudative age-related macular degeneration, left eye, with active choroidal neovascularization: Secondary | ICD-10-CM

## 2023-10-26 DIAGNOSIS — H353112 Nonexudative age-related macular degeneration, right eye, intermediate dry stage: Secondary | ICD-10-CM | POA: Diagnosis not present

## 2023-10-26 DIAGNOSIS — H43813 Vitreous degeneration, bilateral: Secondary | ICD-10-CM | POA: Diagnosis not present

## 2023-10-29 ENCOUNTER — Ambulatory Visit
Admission: EM | Admit: 2023-10-29 | Discharge: 2023-10-29 | Disposition: A | Discharge status: Home / Self Care | Payer: Medicare Other | Attending: Internal Medicine | Admitting: Internal Medicine

## 2023-10-29 DIAGNOSIS — L237 Allergic contact dermatitis due to plants, except food: Secondary | ICD-10-CM

## 2023-10-29 MED ORDER — TRIAMCINOLONE ACETONIDE 0.1 % EX CREA: 1.0000 | TOPICAL_CREAM | Freq: Two times a day (BID) | CUTANEOUS | 0 refills | Status: DC

## 2023-10-29 NOTE — Discharge Instructions (Addendum)
Counter Zanfel as directed on the package Follow-up as needed

## 2023-10-29 NOTE — ED Triage Notes (Signed)
Pt presents to UC w/ c/o left sided head and ear pain and itching x6 days. Pt states 1 week ago, she was exposed to poison ivy. Pt reports swollen lymph nodes as well on the left side of her head.  Pt has been taking benadryl, zyrtec, hydrocortisone which have not helped. She also took "amoxicave" and some leftover amoxicillin she had at home which both helped a small amount.

## 2023-10-29 NOTE — ED Provider Notes (Signed)
[facility]    CSN: [id number] Arrival date & time: 10/29/23  1540      History   Chief Complaint No chief complaint on file.   HPI Amala Petion is a 72 y.o. female.   HPI Rash on left ear x 2 days, admits to exposure to poison ivy. She had some left ear pain prior which she treated with leftover antibiotics with improvement. Denies fever, chills, sweats, rhinorrhea, nasal congestion, change in hearing, cough Denies history of chickenpox  Past Medical History:  Diagnosis Date   Abnormal nuclear stress test    a. 07/2017: cath with no CAD   Anxiety    Aortic atherosclerosis (HCC)    Arthritis    Chronic diastolic CHF (congestive heart failure) (HCC)    Coronary artery calcification seen on CT scan    Crohn disease (HCC)    DVT (deep venous thrombosis) (HCC)    Dyspnea    Grade I diastolic dysfunction    Hyperlipidemia    ILD (interstitial lung disease) (HCC)    OSA on CPAP    CPAP    AND O2   Pernicious anemia    Polycythemia vera (HCC)    Pulmonary embolism (HCC) 2010   s/p hip surgery , reports this was never truly confirmed    Pulmonary hypertension (HCC)    Sleep disorder 01/10/2020   Uses klonopin for sleep since 2014; sleep walking. And anxiety    Patient Active Problem List   Diagnosis Date Noted   Pulmonary emphysema with fibrosis of lung (HCC) 01/31/2022   Sclerosing adenosis of right breast 02/04/2021   Prediabetes 02/01/2021   Pulmonary hypertension, unspecified (HCC) 12/07/2020   Osteopenia 08/05/2020   Adjustment disorder with anxiety 04/29/2020   Sleep disorder 01/10/2020   Chronic allergic rhinitis 01/10/2020   Pernicious anemia 01/10/2020   Chronic prescription benzodiazepine use 01/10/2020   Benign neoplasm of rectum    Dyslipidemia 09/24/2019   Morbid obesity (HCC) 09/24/2019   History of DVT (deep vein thrombosis), with PE 07/25/2019   OSA on CPAP    Crohn disease (HCC) 06/15/2017   ILD (interstitial lung disease)  (HCC) 05/29/2017   Chronic respiratory failure with hypoxia (HCC) 05/29/2017   History of polycythemia vera 11/12/2014    Past Surgical History:  Procedure Laterality Date    RIGHT BREAST LUMPECTOMY WITH RADIOACTIVE SEED LOCALIZATION (Right Breast)  02/04/2021   BIOPSY  09/30/2019   Procedure: BIOPSY;  Surgeon: Beverley Fiedler, MD;  Location: [facility];  Service: Gastroenterology;;   BREAST LUMPECTOMY WITH RADIOACTIVE SEED LOCALIZATION Right 02/04/2021   Procedure: RIGHT BREAST LUMPECTOMY WITH RADIOACTIVE SEED LOCALIZATION;  Surgeon: Abigail Miyamoto, MD;  Location: [facility];  Service: General;  Laterality: Right;   CARDIAC CATHETERIZATION     COLON RESECTION  1993   COLONOSCOPY WITH PROPOFOL N/A 09/30/2019   Procedure: COLONOSCOPY WITH PROPOFOL;  Surgeon: Beverley Fiedler, MD;  Location: [facility];  Service: Gastroenterology;  Laterality: N/A;   HIP ARTHROPLASTY Right    x 2, initial right THA, then subsequent right THA revision    LEFT HEART CATH AND CORONARY ANGIOGRAPHY N/A 07/28/2017   Procedure: Left Heart Cath and Coronary Angiography;  Surgeon: Kathleene Hazel, MD;  Location: [facility];  Service: Cardiovascular;  Laterality: N/A;   POLYPECTOMY  09/30/2019   Procedure: POLYPECTOMY;  Surgeon: Beverley Fiedler, MD;  Location: [facility];  Service: Gastroenterology;;   RIGHT HEART CATH N/A 12/07/2020   Procedure: RIGHT HEART CATH;  Surgeon: Tonny Bollman, MD;  Location: [facility] CV LAB;  Service: Cardiovascular;  Laterality: N/A;    OB History     Gravida  0   Para  0   Term  0   Preterm  0   AB  0   Living  0      SAB  0   IAB  0   Ectopic  0   Multiple  0   Live Births  0            Home Medications    Prior to Admission medications   Medication Sig Start Date End Date Taking? Authorizing Provider  albuterol (VENTOLIN HFA) 108 (90 Base) MCG/ACT inhaler Inhale 2 puffs into the lungs every 6 (six) hours as needed for wheezing or shortness  of breath. 04/06/23   Kalman Shan, MD  amoxicillin (AMOXIL) 500 MG capsule Take 4 capsules (2,000 mg total) by mouth once as needed for up to 1 dose. Before dental procedures 01/03/23   Willow Ora, MD  aspirin EC 81 MG tablet Take 81 mg by mouth daily.    [provider]  bevacizumab (AVASTIN) 1.25 mg/0.1 mL SOLN 1.25 mg by Intravitreal route every 30 (thirty) days.    [provider]  Calcium Carbonate-Vit D-Min (CALCIUM 1200) 1200-1000 MG-UNIT CHEW Chew 1 tablet by mouth daily. 08/23/20   [provider]  CANNABIDIOL PO Take 1 Dose by mouth 2 (two) times daily as needed (anxiety/sleep.). CBD Oil    [provider]  cetirizine (ZYRTEC) 10 MG tablet Take 10 mg by mouth daily as needed (sinus/allergies.).    [provider]  cetirizine-pseudoephedrine (ZYRTEC-D) 5-120 MG tablet Take 1 tablet by mouth daily as needed (sinus headaches.).    [provider]  clonazePAM (KLONOPIN) 0.5 MG tablet TAKE 1/2 TO 1 TABLET(0.25 TO 0.5 MG) BY MOUTH DAILY AS NEEDED FOR ANXIETY 07/06/23   Willow Ora, MD  cyanocobalamin (VITAMIN B12) 1000 MCG/ML injection Inject 1 mL (1,000 mcg total) into the muscle every 30 (thirty) days. 01/03/23   Willow Ora, MD  FLUoxetine (PROZAC) 20 MG capsule Take 1 capsule (20 mg total) by mouth daily. 01/03/23   Willow Ora, MD  fluticasone [facility]) 50 MCG/ACT nasal spray Place 1-2 sprays into both nostrils daily as needed for allergies or rhinitis.    [provider]  Fluticasone-Umeclidin-Vilant (TRELEGY ELLIPTA) 100-62.5-25 MCG/ACT AEPB Inhale 1 puff into the lungs daily. 09/22/22   Kalman Shan, MD  furosemide (LASIX) 20 MG tablet Take 1 tablet (20 mg total) by mouth as needed for fluid. 10/03/23   Wendall Stade, MD  Glycerin-Polysorbate 80 (REFRESH DRY EYE THERAPY OP) Place 1 drop into both eyes daily.    [provider]  hydrocortisone (PROCTOZONE-HC) 2.5 % rectal cream Place 1 application  rectally 2 (two) times daily as needed (Crohn's flare). 08/02/21   Willow Ora, MD  ibuprofen (ADVIL) 200 MG tablet Take 400-800 mg by mouth every 8 (eight) hours as needed (pain.).    [provider]  mesalamine (PENTASA) 250 MG CR capsule Take 250-500 mg by mouth 4 (four) times daily as needed (crohn's disease flare ups).    [provider]  Multiple Vitamins-Minerals (ICAPS AREDS 2 PO) Take 1 tablet by mouth daily. 12/26/16   [provider]  potassium chloride (MICRO-K) 10 MEQ CR capsule Take 1 capsule (10 mEq total) by mouth daily as needed (when taking furosemide). 10/10/23   Charlton Haws  C, MD  prednisoLONE acetate (PRED FORTE) 1 % ophthalmic suspension Place 1 drop into both eyes 2 (two) times daily as needed (Uveitis).    [provider]  Probiotic Product (PROBIOTIC PO) Take 1 capsule by mouth 2 (two) times a week. As needed    [provider]  SYRINGE-NEEDLE, DISP, 3 ML (LUER LOCK SAFETY SYRINGES) 25G X 1" 3 ML MISC USE AS DIRECTED 10/07/22   Willow Ora, MD  traMADol (ULTRAM) 50 MG tablet Take 1 tablet (50 mg total) by mouth every 6 (six) hours as needed. 02/05/21   Abigail Miyamoto, MD  traZODone (DESYREL) 50 MG tablet TAKE 0.5-1 TABLETS BY MOUTH AT BEDTIME AS NEEDED FOR SLEEP. 01/03/23   Willow Ora, MD  Treprostinil (TYVASO REFILL) 0.6 MG/ML SOLN Inhale 54 mcg into the lungs in the morning, at noon, in the evening, and at bedtime. 07/01/22   Kalman Shan, MD    Family History Family History  Problem Relation Age of Onset   Glaucoma Mother    High blood pressure Mother    High Cholesterol Mother    Sleep apnea Mother    Diabetes Mellitus II Father    Sleep apnea Father    Anxiety disorder Father    COPD Brother    Heart disease Brother    Non-Hodgkin's lymphoma Brother    Diabetes Mellitus II Brother    Glaucoma Brother    Colon cancer Neg Hx    Esophageal cancer Neg Hx    Pancreatic cancer Neg Hx    Stomach cancer  Neg Hx    Liver disease Neg Hx     Social History Social History   Tobacco Use   Smoking status: Former    Current packs/day: 0.00    Average packs/day: 1 pack/day for 45.0 years (45.0 ttl pk-yrs)    Types: Cigarettes    Start date: 12/27/1963    Quit date: 12/26/2008    Years since quitting: 14.8   Smokeless tobacco: Never  Vaping Use   Vaping status: Never Used  Substance Use Topics   Alcohol use: Yes    Comment: occ   Drug use: No     Allergies   Tizanidine hcl, Victoza [liraglutide], Metformin and related, Onion, Lasix [furosemide], Pirfenidone, Potassium-containing compounds, Prednisone, Sulfa drugs cross reactors, and Trelegy ellipta [fluticasone-umeclidin-vilant]   Review of Systems Review of Systems  Constitutional:  Negative for chills and fever.  HENT:  Negative for rhinorrhea and trouble swallowing.   Respiratory:  Positive for cough.   Skin:  Positive for rash. Negative for color change.     Physical Exam Triage Vital Signs ED Triage Vitals  Encounter Vitals Group     BP 10/29/23 1601 (!) 142/89     Systolic BP Percentile --      Diastolic BP Percentile --      Pulse Rate 10/29/23 1601 78     Resp 10/29/23 1601 16     Temp 10/29/23 1601 98.7 F (37.1 C)     Temp Source 10/29/23 1601 Oral     SpO2 10/29/23 1601 95 %     Weight --      Height --      Head Circumference --      Peak Flow --      Pain Score 10/29/23 1600 5     Pain Loc --      Pain Education --      Exclude from Growth Chart --    No  data found.  Updated Vital Signs BP (!) 142/89 (BP Location: Right Arm)   Pulse 78   Temp 98.7 F (37.1 C) (Oral)   Resp 16   SpO2 95% Comment: oxygen tank from home  Visual Acuity Right Eye Distance:   Left Eye Distance:   Bilateral Distance:    Right Eye Near:   Left Eye Near:    Bilateral Near:     Physical Exam Vitals and nursing note reviewed.  Constitutional:      Appearance: She is obese.  HENT:     Right Ear: Tympanic  membrane and ear canal normal.     Left Ear: Tympanic membrane and ear canal normal.  Eyes:     Extraocular Movements: Extraocular movements intact.     Conjunctiva/sclera: Conjunctivae normal.  Cardiovascular:     Rate and Rhythm: Normal rate.  Pulmonary:     Effort: Pulmonary effort is normal. No respiratory distress.  Musculoskeletal:     Cervical back: Neck supple.  Lymphadenopathy:     Cervical: No cervical adenopathy.  Skin:    General: Skin is warm.     Findings: Rash (Papular excoriated rash few lesions left pinna multiple lesions left cheek and left side of neck.  No vesicles, no pustules, no edema.  Scalp rash) present.  Neurological:     Mental Status: She is alert and oriented to person, place, and time.      UC Treatments / Results  Labs (all labs ordered are listed, but only abnormal results are displayed) Labs Reviewed - No data to display  EKG   Radiology No results found.  Procedures Procedures (including critical care time)  Medications Ordered in UC Medications - No data to display  Initial Impression / Assessment and Plan / UC Course  I have reviewed the triage vital signs and the nursing notes.  Pertinent labs & imaging results that were available during my care of the patient were reviewed by me and considered in my medical decision making (see chart for details).     Itchy rash after exposure to poison ivy, rash consistent with a contact dermatitis. Had problems with prednisone in the past will treat with topical steroid And over-the-counter Zanfel Final Clinical Impressions(s) / UC Diagnoses   Final diagnoses:  None   Discharge Instructions   None    ED Prescriptions   None    PDMP not reviewed this encounter.   Meliton Rattan, [place] 10/29/23 [phone]

## 2023-10-30 LAB — PULMONARY FUNCTION TEST
DL/VA % pred: 73 %
DL/VA: 3.04 ml/min/mmHg/L
DLCO cor % pred: 68 %
DLCO cor: 12.75 ml/min/mmHg
DLCO unc % pred: 68 %
DLCO unc: 12.75 ml/min/mmHg
FEF 25-75 Pre: 0.77 L/s
FEF2575-%Pred-Pre: 45 %
FEV1-%Pred-Pre: 68 %
FEV1-Pre: 1.42 L
FEV1FVC-%Pred-Pre: 78 %
FEV6-%Pred-Pre: 89 %
FEV6-Pre: 2.37 L
FEV6FVC-%Pred-Pre: 103 %
FVC-%Pred-Pre: 86 %
FVC-Pre: 2.4 L
Pre FEV1/FVC ratio: 59 %
Pre FEV6/FVC Ratio: 99 %

## 2023-11-13 DIAGNOSIS — H40013 Open angle with borderline findings, low risk, bilateral: Secondary | ICD-10-CM | POA: Diagnosis not present

## 2023-11-14 ENCOUNTER — Ambulatory Visit (INDEPENDENT_AMBULATORY_CARE_PROVIDER_SITE_OTHER): Payer: Medicare Other | Admitting: Neurology

## 2023-11-14 ENCOUNTER — Encounter: Payer: Self-pay | Admitting: Neurology

## 2023-11-14 VITALS — BP 140/89 | HR 91 | Ht 61.0 in | Wt 264.0 lb

## 2023-11-14 DIAGNOSIS — Z6834 Body mass index (BMI) 34.0-34.9, adult: Secondary | ICD-10-CM | POA: Diagnosis not present

## 2023-11-14 DIAGNOSIS — E0965 Drug or chemical induced diabetes mellitus with hyperglycemia: Secondary | ICD-10-CM | POA: Diagnosis not present

## 2023-11-14 DIAGNOSIS — M62838 Other muscle spasm: Secondary | ICD-10-CM | POA: Insufficient documentation

## 2023-11-14 DIAGNOSIS — R269 Unspecified abnormalities of gait and mobility: Secondary | ICD-10-CM | POA: Insufficient documentation

## 2023-11-14 NOTE — Progress Notes (Signed)
Chief Complaint  Patient presents with   Extremity Weakness    Rm 15 alone Pt is well, reports about 1.5 years ago she started having weakness and tingling in R leg. She did pull a muscle and symptoms progressed over time. She started having pain and spasms.       ASSESSMENT AND PLAN  Patty [name] is a 72 y.o. female Gradual onset gait abnormality Hyperreflexia on examinations, Reported nonrevealing MRI of lumbar spine, and EMG nerve conduction study from orthopedic clinic, will get record to review MRI  of cervical spine to rule out cervical spondylitic myelopathy Laboratory evaluations  Follow-up depend on above workup result DIAGNOSTIC DATA (LABS, IMAGING, TESTING) - I reviewed patient records, labs, notes, testing and imaging myself where available.   MEDICAL HISTORY:  Patty [name] is a 72 year old female, seen in request by her primary care physician Dr. Mardelle Matte, [place] for evaluation of gait abnormality, right leg muscle spasm, initial evaluation November 14, 2023  History is obtained from the patient and review of electronic medical records. I personally reviewed pertinent available imaging films in PACS.   PMHx of  Anxiety, Chronic insomnia, Prozac, Clonazepam DVT, PE following right hip surgery in 2018 Polycythemia vera CAD Interstitial pulmonary diease, on 24x7 oxygen 2019 History of smoke  She had a history of interstitial pulmonary disease, began to use oxygen since 2019, but remain active, enjoying the hiking, workout at the gym 3 times a week  But over the past couple years, she noticed frequent muscle spasm, easily pull her muscle doing routine daily activity, in summer 2023, bending over to pick up object from the floor, she pulled her right lateral neck muscle, improved after resting, then 3 months later, pulling a cart, turning her body over, she experienced horrible pain in her right posterior thigh, difficulty walking, seeing orthopedic  clinic Dr. Penni Bombard, was given the diagnosis of hamstring injury, she improved some with physical therapy, was noted to have smaller right calf, had extensive evaluation, MRI of lumbar spine, EMG nerve conduction study, there was no clear etiology found to explain the abnormal findings per patient, but I do not have the record  She noticed gradual onset of memory loss over the past couple years, denies bowel and bladder incontinence, to have shoulder tension, occipital area pain,  She also reported few months history of exposure to sewage gas at her house in the spring 2024 PHYSICAL EXAM:   Vitals:   11/14/23 1444  BP: (!) 140/89  Pulse: 91  Weight: 264 lb (119.7 kg)  Height: 5\' 1"  (1.549 m)     Body mass index is 49.88 kg/m.  PHYSICAL EXAMNIATION:  Gen: NAD, conversant, well nourised, well groomed                     Cardiovascular: Regular rate rhythm, no peripheral edema, warm, nontender. Eyes: Conjunctivae clear without exudates or hemorrhage Neck: Supple, no carotid bruits. Pulmonary: Clear to auscultation bilaterally   NEUROLOGICAL EXAM:  MENTAL STATUS: Speech/cognition: Awake, alert, oriented to history taking and casual conversation CRANIAL NERVES: CN II: Visual fields are full to confrontation. Pupils are round equal and briskly reactive to light. CN III, IV, VI: extraocular movement are normal. No ptosis. CN V: Facial sensation is intact to light touch CN VII: Face is symmetric with normal eye closure  CN VIII: Hearing is normal to causal conversation. CN IX, X: Phonation is normal. CN XI: Head turning and shoulder shrug are intact  MOTOR: Maybe slight smaller right calf, but there was no significant bilateral lower extremity proximal and distal muscle weakness noted  REFLEXES: Reflexes are 2+ and symmetric at the biceps, triceps, 3/3 knees, and ankles. Plantar responses are extensor bilaterally  SENSORY: Intact to light touch, pinprick and vibratory sensation  are intact in fingers and toes.  COORDINATION: There is no trunk or limb dysmetria noted.  GAIT/STANCE: Need push-up to get up from seated position, stiff, cautious  REVIEW OF SYSTEMS:  Full 14 system review of systems performed and notable only for as above All other review of systems were negative.   ALLERGIES: Allergies  Allergen Reactions   Tizanidine Hcl Shortness Of Breath   Victoza [Liraglutide] Shortness Of Breath   Metformin And Related Diarrhea    Increased moodiness   Onion Other (See Comments)    sereve diarrhea, stomach pains (Crohn's flares)   Lasix [Furosemide] Itching    Hypersensitivity - pt can take as needed    Pirfenidone Other (See Comments)    GI side effects   Potassium-Containing Compounds Other (See Comments)    Due to chron's, difficult passing though kidneys not allowing absorption in the body    Prednisone Other (See Comments)    Altered mood   Sulfa Drugs Cross Reactors Other (See Comments)    "feels like bugs crawling on me"   Trelegy Ellipta [Fluticasone-Umeclidin-Vilant] Palpitations    HOME MEDICATIONS: Current Outpatient Medications  Medication Sig Dispense Refill   albuterol (VENTOLIN HFA) 108 (90 Base) MCG/ACT inhaler Inhale 2 puffs into the lungs every 6 (six) hours as needed for wheezing or shortness of breath. 8 g 6   amoxicillin (AMOXIL) 500 MG capsule Take 4 capsules (2,000 mg total) by mouth once as needed for up to 1 dose. Before dental procedures 20 capsule 1   aspirin EC 81 MG tablet Take 81 mg by mouth daily.     bevacizumab (AVASTIN) 1.25 mg/0.1 mL SOLN 1.25 mg by Intravitreal route every 30 (thirty) days.     Calcium Carbonate-Vit D-Min (CALCIUM 1200) 1200-1000 MG-UNIT CHEW Chew 1 tablet by mouth daily.     CANNABIDIOL PO Take 1 Dose by mouth 2 (two) times daily as needed (anxiety/sleep.). CBD Oil     cetirizine (ZYRTEC) 10 MG tablet Take 10 mg by mouth daily as needed (sinus/allergies.).     cetirizine-pseudoephedrine  (ZYRTEC-D) 5-120 MG tablet Take 1 tablet by mouth daily as needed (sinus headaches.).     clonazePAM (KLONOPIN) 0.5 MG tablet TAKE 1/2 TO 1 TABLET(0.25 TO 0.5 MG) BY MOUTH DAILY AS NEEDED FOR ANXIETY 90 tablet 1   cyanocobalamin (VITAMIN B12) 1000 MCG/ML injection Inject 1 mL (1,000 mcg total) into the muscle every 30 (thirty) days. 10 mL 1   FLUoxetine (PROZAC) 20 MG capsule Take 1 capsule (20 mg total) by mouth daily. 90 capsule 3   fluticasone (FLONASE) 50 MCG/ACT nasal spray Place 1-2 sprays into both nostrils daily as needed for allergies or rhinitis.     Fluticasone-Umeclidin-Vilant (TRELEGY ELLIPTA) 100-62.5-25 MCG/ACT AEPB Inhale 1 puff into the lungs daily. 14 each 0   furosemide (LASIX) 20 MG tablet Take 1 tablet (20 mg total) by mouth as needed for fluid. 30 tablet 11   Glycerin-Polysorbate 80 (REFRESH DRY EYE THERAPY OP) Place 1 drop into both eyes daily.     hydrocortisone (PROCTOZONE-HC) 2.5 % rectal cream Place 1 application rectally 2 (two) times daily as needed (Crohn's flare). 28 g 2   ibuprofen (ADVIL) 200 MG tablet  Take 400-800 mg by mouth every 8 (eight) hours as needed (pain.).     mesalamine (PENTASA) 250 MG CR capsule Take 250-500 mg by mouth 4 (four) times daily as needed (crohn's disease flare ups).     Multiple Vitamins-Minerals (ICAPS AREDS 2 PO) Take 1 tablet by mouth daily.     potassium chloride (MICRO-K) 10 MEQ CR capsule Take 1 capsule (10 mEq total) by mouth daily as needed (when taking furosemide). 30 capsule 3   prednisoLONE acetate (PRED FORTE) 1 % ophthalmic suspension Place 1 drop into both eyes 2 (two) times daily as needed (Uveitis).     Probiotic Product (PROBIOTIC PO) Take 1 capsule by mouth 2 (two) times a week. As needed     SYRINGE-NEEDLE, DISP, 3 ML (LUER LOCK SAFETY SYRINGES) 25G X 1" 3 ML MISC USE AS DIRECTED 100 each 5   traMADol (ULTRAM) 50 MG tablet Take 1 tablet (50 mg total) by mouth every 6 (six) hours as needed. 20 tablet 0   traZODone  (DESYREL) 50 MG tablet TAKE 0.5-1 TABLETS BY MOUTH AT BEDTIME AS NEEDED FOR SLEEP. 90 tablet 3   Treprostinil (TYVASO REFILL) 0.6 MG/ML SOLN Inhale 54 mcg into the lungs in the morning, at noon, in the evening, and at bedtime. 87 mL 11   triamcinolone cream (KENALOG) 0.1 % Apply 1 Application topically 2 (two) times daily. 30 g 0   No current facility-administered medications for this visit.    PAST MEDICAL HISTORY: Past Medical History:  Diagnosis Date   Abnormal nuclear stress test    a. 07/2017: cath with no CAD   Anxiety    Aortic atherosclerosis (HCC)    Arthritis    Chronic diastolic CHF (congestive heart failure) (HCC)    Coronary artery calcification seen on CT scan    Crohn disease (HCC)    DVT (deep venous thrombosis) (HCC)    Dyspnea    Grade I diastolic dysfunction    Hyperlipidemia    ILD (interstitial lung disease) (HCC)    OSA on CPAP    CPAP    AND O2   Pernicious anemia    Polycythemia vera (HCC)    Pulmonary embolism (HCC) 2010   s/p hip surgery , reports this was never truly confirmed    Pulmonary hypertension (HCC)    Sleep disorder 01/10/2020   Uses klonopin for sleep since 2014; sleep walking. And anxiety    PAST SURGICAL HISTORY: Past Surgical History:  Procedure Laterality Date    RIGHT BREAST LUMPECTOMY WITH RADIOACTIVE SEED LOCALIZATION (Right Breast)  02/04/2021   BIOPSY  09/30/2019   Procedure: BIOPSY;  Surgeon: Beverley Fiedler, MD;  Location: [facility];  Service: Gastroenterology;;   BREAST LUMPECTOMY WITH RADIOACTIVE SEED LOCALIZATION Right 02/04/2021   Procedure: RIGHT BREAST LUMPECTOMY WITH RADIOACTIVE SEED LOCALIZATION;  Surgeon: Abigail Miyamoto, MD;  Location: [facility] OR;  Service: General;  Laterality: Right;   CARDIAC CATHETERIZATION     COLON RESECTION  1993   COLONOSCOPY WITH PROPOFOL N/A 09/30/2019   Procedure: COLONOSCOPY WITH PROPOFOL;  Surgeon: Beverley Fiedler, MD;  Location: [facility];  Service: Gastroenterology;  Laterality: N/A;    HIP ARTHROPLASTY Right    x 2, initial right THA, then subsequent right THA revision    LEFT HEART CATH AND CORONARY ANGIOGRAPHY N/A 07/28/2017   Procedure: Left Heart Cath and Coronary Angiography;  Surgeon: Kathleene Hazel, MD;  [facility] INVASIVE CV LAB;  Service: Cardiovascular;  Laterality: N/A;   POLYPECTOMY  09/30/2019   Procedure: POLYPECTOMY;  Surgeon: Beverley Fiedler, MD;  Location: [facility];  Service: Gastroenterology;;   RIGHT HEART CATH N/A 12/07/2020   Procedure: RIGHT HEART CATH;  Surgeon: Tonny Bollman, MD;  Location: [facility] INVASIVE CV LAB;  Service: Cardiovascular;  Laterality: N/A;    FAMILY HISTORY: Family History  Problem Relation Age of Onset   Glaucoma Mother    High blood pressure Mother    High Cholesterol Mother    Sleep apnea Mother    Diabetes Mellitus II Father    Sleep apnea Father    Anxiety disorder Father    COPD Brother    Heart disease Brother    Non-Hodgkin's lymphoma Brother    Diabetes Mellitus II Brother    Glaucoma Brother    Colon cancer Neg Hx    Esophageal cancer Neg Hx    Pancreatic cancer Neg Hx    Stomach cancer Neg Hx    Liver disease Neg Hx     SOCIAL HISTORY: Social History   Socioeconomic History   Marital status: Single    Spouse name: Not on file   Number of children: 0   Years of education: Not on file   Highest education level: Not on file  Occupational History   Occupation: retired  Tobacco Use   Smoking status: Former    Current packs/day: 0.00    Average packs/day: 1 pack/day for 45.0 years (45.0 ttl pk-yrs)    Types: Cigarettes    Start date: 12/27/1963    Quit date: 12/26/2008    Years since quitting: 14.8   Smokeless tobacco: Never  Vaping Use   Vaping status: Never Used  Substance and Sexual Activity   Alcohol use: Yes    Comment: occ   Drug use: No   Sexual activity: Not Currently  Other Topics Concern   Not on file  Social History Narrative   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    Social Determinants of Health   Financial Resource Strain: Low Risk  (12/03/2020)   Overall Financial Resource Strain (CARDIA)    Difficulty of Paying Living Expenses: Not hard at all  Food Insecurity: No Food Insecurity (10/13/2022)   Hunger Vital Sign    Worried About Running Out of Food in the Last Year: Never true    Ran Out of Food in the Last Year: Never true  Transportation Needs: No Transportation Needs (10/13/2022)   PRAPARE - Administrator, Civil Service (Medical): No    Lack of Transportation (Non-Medical): No  Physical Activity: Insufficiently Active (12/03/2020)   Exercise Vital Sign    Days of Exercise per Week: 4 days    Minutes of Exercise per Session: 30 min  Stress: Stress Concern Present (12/03/2020)   [employer] of Occupational Health - Occupational Stress Questionnaire    Feeling of Stress : To some extent  Social Connections: Socially Isolated (12/03/2020)   Social Connection and Isolation Panel [NHANES]    Frequency of Communication with Friends and Family: Twice a week    Frequency of Social Gatherings with Friends and Family: Never    Attends Religious Services: 1 to 4 times per year    Active Member of [employer] or Organizations: No    Attends Banker Meetings: Never    Marital Status: Never married  Intimate Partner Violence: Not At Risk (12/03/2020)   Humiliation, Afraid, Rape, and Kick questionnaire    Fear  of Current or Ex-Partner: No    Emotionally Abused: No    Physically Abused: No    Sexually Abused: No      Levert Feinstein, M.D. Ph.D.  [facility] [address] [place], [place] [address] Ph: [phone] Fax: [phone]  CC:  Delfin Gant, MD [address] [place],  [place] [address]  Willow Ora, MD

## 2023-11-15 LAB — CBC WITH DIFFERENTIAL/PLATELET
Basophils Absolute: 0 10*3/uL (ref 0.0–0.2)
Basos: 0 %
EOS (ABSOLUTE): 0.1 10*3/uL (ref 0.0–0.4)
Eos: 2 %
Hematocrit: 46.6 % (ref 34.0–46.6)
Hemoglobin: 14.7 g/dL (ref 11.1–15.9)
Immature Grans (Abs): 0 10*3/uL (ref 0.0–0.1)
Immature Granulocytes: 0 %
Lymphocytes Absolute: 1.8 10*3/uL (ref 0.7–3.1)
Lymphs: 24 %
MCH: 28.8 pg (ref 26.6–33.0)
MCHC: 31.5 g/dL (ref 31.5–35.7)
MCV: 91 fL (ref 79–97)
Monocytes Absolute: 0.7 10*3/uL (ref 0.1–0.9)
Monocytes: 9 %
Neutrophils Absolute: 4.9 10*3/uL (ref 1.4–7.0)
Neutrophils: 65 %
Platelets: 373 10*3/uL (ref 150–450)
RBC: 5.11 x10E6/uL (ref 3.77–5.28)
RDW: 14 % (ref 11.7–15.4)
WBC: 7.5 10*3/uL (ref 3.4–10.8)

## 2023-11-15 LAB — CK: Total CK: 98 U/L (ref 32–182)

## 2023-11-15 LAB — RPR: RPR Ser Ql: NONREACTIVE

## 2023-11-15 LAB — VITAMIN B12: Vitamin B-12: 1443 pg/mL — ABNORMAL HIGH (ref 232–1245)

## 2023-11-15 LAB — COMPREHENSIVE METABOLIC PANEL
ALT: 17 [IU]/L (ref 0–32)
AST: 20 [IU]/L (ref 0–40)
Albumin: 4.2 g/dL (ref 3.8–4.8)
Alkaline Phosphatase: 103 [IU]/L (ref 44–121)
BUN/Creatinine Ratio: 28 (ref 12–28)
BUN: 20 mg/dL (ref 8–27)
Bilirubin Total: 0.3 mg/dL (ref 0.0–1.2)
CO2: 23 mmol/L (ref 20–29)
Calcium: 9.7 mg/dL (ref 8.7–10.3)
Chloride: 102 mmol/L (ref 96–106)
Creatinine, Ser: 0.71 mg/dL (ref 0.57–1.00)
Globulin, Total: 2.9 g/dL (ref 1.5–4.5)
Glucose: 109 mg/dL — ABNORMAL HIGH (ref 70–99)
Potassium: 4.2 mmol/L (ref 3.5–5.2)
Sodium: 141 mmol/L (ref 134–144)
Total Protein: 7.1 g/dL (ref 6.0–8.5)
eGFR: 90 mL/min/{1.73_m2} (ref >59)

## 2023-11-15 LAB — VITAMIN D 25 HYDROXY (VIT D DEFICIENCY, FRACTURES): Vit D, 25-Hydroxy: 38 ng/mL (ref 30.0–100.0)

## 2023-11-15 LAB — HGB A1C W/O EAG: Hgb A1c MFr Bld: 6.8 % — ABNORMAL HIGH (ref 4.8–5.6)

## 2023-11-15 LAB — C-REACTIVE PROTEIN: CRP: 4 mg/L (ref 0–10)

## 2023-11-15 LAB — FOLATE: Folate: 13 ng/mL (ref >3.0)

## 2023-11-15 LAB — TSH: TSH: 0.503 u[IU]/mL (ref 0.450–4.500)

## 2023-11-15 LAB — ANA W/REFLEX IF POSITIVE: Anti Nuclear Antibody (ANA): NEGATIVE

## 2023-11-16 ENCOUNTER — Encounter: Payer: Self-pay | Admitting: Neurology

## 2023-11-16 ENCOUNTER — Telehealth: Payer: Self-pay | Admitting: Neurology

## 2023-11-16 NOTE — Progress Notes (Signed)
Will discuss at upcoming appt.

## 2023-11-16 NOTE — Telephone Encounter (Signed)
medicare/[employer] NPR sent to [facility] [phone] 

## 2023-11-30 ENCOUNTER — Encounter (INDEPENDENT_AMBULATORY_CARE_PROVIDER_SITE_OTHER): Payer: Medicare Other | Admitting: Ophthalmology

## 2023-11-30 DIAGNOSIS — H35033 Hypertensive retinopathy, bilateral: Secondary | ICD-10-CM | POA: Diagnosis not present

## 2023-11-30 DIAGNOSIS — H353221 Exudative age-related macular degeneration, left eye, with active choroidal neovascularization: Secondary | ICD-10-CM | POA: Diagnosis not present

## 2023-11-30 DIAGNOSIS — H353112 Nonexudative age-related macular degeneration, right eye, intermediate dry stage: Secondary | ICD-10-CM | POA: Diagnosis not present

## 2023-11-30 DIAGNOSIS — H25812 Combined forms of age-related cataract, left eye: Secondary | ICD-10-CM | POA: Diagnosis not present

## 2023-11-30 DIAGNOSIS — H43813 Vitreous degeneration, bilateral: Secondary | ICD-10-CM | POA: Diagnosis not present

## 2023-12-04 ENCOUNTER — Encounter: Payer: Self-pay | Admitting: Family Medicine

## 2023-12-04 ENCOUNTER — Ambulatory Visit (INDEPENDENT_AMBULATORY_CARE_PROVIDER_SITE_OTHER): Payer: Medicare Other | Admitting: Family Medicine

## 2023-12-04 VITALS — BP 130/88 | HR 92 | Temp 98.3°F | Ht 61.0 in | Wt 262.2 lb

## 2023-12-04 DIAGNOSIS — E1165 Type 2 diabetes mellitus with hyperglycemia: Secondary | ICD-10-CM | POA: Diagnosis not present

## 2023-12-04 DIAGNOSIS — R030 Elevated blood-pressure reading, without diagnosis of hypertension: Secondary | ICD-10-CM

## 2023-12-04 DIAGNOSIS — R809 Proteinuria, unspecified: Secondary | ICD-10-CM | POA: Insufficient documentation

## 2023-12-04 DIAGNOSIS — J849 Interstitial pulmonary disease, unspecified: Secondary | ICD-10-CM

## 2023-12-04 DIAGNOSIS — J9611 Chronic respiratory failure with hypoxia: Secondary | ICD-10-CM

## 2023-12-04 DIAGNOSIS — K50919 Crohn's disease, unspecified, with unspecified complications: Secondary | ICD-10-CM | POA: Diagnosis not present

## 2023-12-04 DIAGNOSIS — D45 Polycythemia vera: Secondary | ICD-10-CM | POA: Diagnosis not present

## 2023-12-04 LAB — MICROALBUMIN / CREATININE URINE RATIO
Creatinine,U: 61.3 mg/dL
Microalb Creat Ratio: 4.7 mg/g (ref 0.0–30.0)
Microalb, Ur: 2.9 mg/dL — ABNORMAL HIGH (ref 0.0–1.9)

## 2023-12-04 LAB — LIPID PANEL
Cholesterol: 203 mg/dL — ABNORMAL HIGH (ref 0–200)
HDL: 56.3 mg/dL (ref >39.00)
LDL Cholesterol: 131 mg/dL — ABNORMAL HIGH (ref 0–99)
NonHDL: 146.64
Total CHOL/HDL Ratio: 4
Triglycerides: 78 mg/dL (ref 0.0–149.0)
VLDL: 15.6 mg/dL (ref 0.0–40.0)

## 2023-12-04 NOTE — Patient Instructions (Addendum)
Please return in 3 months for diabetes follow up   I will release your lab results to you on your MyChart account with further instructions. You may see the results before I do, but when I review them I will send you a message with my report or have my assistant call you if things need to be discussed. Please reply to my message with any questions. Thank you!   If you have any questions or concerns, please don't hesitate to send me a message via MyChart or call the office at [phone]. Thank you for visiting with [name] today! It's our pleasure caring for you.   VISIT SUMMARY:  During today's visit, we discussed your diabetes management, blood pressure, cholesterol levels, and other health concerns. We reviewed your current health status and made plans for further testing and follow-up care.  YOUR PLAN:  -DIABETES MELLITUS TYPE 2: Diabetes Mellitus Type 2 is a condition where your body does not use insulin properly, leading to high blood sugar levels. Your A1c is 6.8%, which indicates fair control. Continue managing your diet, monitor your blood sugar levels regularly, and we will discuss medication options if your condition worsens. Also, keep an eye on your blood pressure and cholesterol levels as they can be affected by diabetes. we want fasting blood sugars &lt; 130 and 2 hours after meal sugars always less than 160.   -HYPERTENSION (SECONDARY TO AVASTIN): Hypertension is high blood pressure, which in your case may be related to Avastin injections. Your blood pressure has increased but is now starting to normalize. Please monitor your blood pressure at home and we will reassess it in three months.  -HYPERLIPIDEMIA: Hyperlipidemia means you have high levels of cholesterol in your blood. Your LDL cholesterol is borderline high at 127 mg/dL. We discussed the potential need for medication, and you may consider Zetia as an alternative to statins. We will order a cholesterol test and reassess your  levels in three months.  -PROTEINURIA (SUSPECTED): Proteinuria is the presence of excess protein in your urine, which can be a sign of kidney damage. You reported foamy urine, which may indicate this condition. We will order a urine test to check for proteinuria and reassess in three months if protein is present.  -NEUROPATHY (SUSPECTED): Neuropathy is nerve damage that can cause pain and muscle weakness. You have leg pain and muscle atrophy, and we are awaiting further evaluation by a neurologist. We will follow up with the neurologist for more information.  -HAMSTRING INJURY: You have chronic pain in your hamstring following an injury. You have been undergoing physical therapy and have seen an orthopedic specialist. Continue with physical therapy and follow up with the orthopedic specialist as needed.  -GENERAL HEALTH MAINTENANCE: We discussed the importance of regular monitoring and preventive measures. We will order blood and urine tests and schedule a follow-up appointment in three months.  INSTRUCTIONS:  Please schedule a follow-up appointment in three months. We will review your test results and send you a message with any updates.

## 2023-12-04 NOTE — Progress Notes (Signed)
See mychart note Dear Patty [name], It was good seeing you today. Your cholesterol readings are mildly elevated as we discussed. Let me know if you are willing to try zetia to lower these numbers. A statin would be the first line recommendation but I know you prefer to not take a statin.  Your urine testing looks almost normal. I will recheck it again in 3 months.  Sincerely, Dr. Mardelle Matte

## 2023-12-04 NOTE — Progress Notes (Signed)
Subjective  CC:  Chief Complaint  Patient presents with   osteopenia   Anxiety   Hypertension    HPI: Patty [name] is a 72 y.o. female who presents to the office today for follow up of diabetes and problems listed above in the chief complaint.  Annual f/u; reviewed multiple records from specialists over the last year.  Discussed the use of AI scribe software for clinical note transcription with the patient, who gave verbal consent to proceed.  History of Present Illness   The patient, with a history of diabetes, Crohn's disease, and macular degeneration, presents with concerns about her diabetes control and potential kidney damage. She reports an A1c of 6.8, which is in the diabetic range, and has been managing her diabetes through diet control, aiming to keep her daily carbohydrate intake under 130 and sugar intake under 25-30. She expresses concern about potential future need for diabetes medication, as she has previously experienced adverse side effects from metformin and Victoza, including fluctuating blood sugar levels and vision problems.  The patient also reports a history of foamy urine, which she suspects may indicate proteinuria due to Avastin injections for macular degeneration. She has not yet had a urine test to confirm this. She also notes that her blood pressure has increased since starting Avastin, although it is now starting to decrease.  In addition to these concerns, the patient reports a recent fall due to a chair collapsing. She experienced a large bruise on her buttocks but did not dislocate her hip. She also mentions a previous hamstring issue, which led to physical therapy. During therapy, she experienced persistent pain on the side of her leg and noticed that her calf muscle had atrophied. She is currently awaiting insurance approval for an MRI.  The patient also mentions exposure to sewer gases in her home for four months, which she believes may have  contributed to her calf muscle atrophy. She is currently on oxygen therapy, which restricts her ability to travel and engage in activities she enjoys, such as hiking. She also reports occasional foot swelling and takes Lasix as needed, although she experiences itching as a side effect.      Wt Readings from Last 3 Encounters:  12/04/23 262 lb 3.2 oz (118.9 kg)  11/14/23 264 lb (119.7 kg)  10/25/23 268 lb 6.4 oz (121.7 kg)    BP Readings from Last 3 Encounters:  12/04/23 130/88  11/14/23 (!) 140/89  10/29/23 (!) 142/89    Assessment  1. Type 2 diabetes mellitus with hyperglycemia, without long-term current use of insulin (HCC)   2. Elevated blood pressure reading without diagnosis of hypertension   3. Morbid obesity (HCC)   4. ILD (interstitial lung disease) (HCC)   5. Chronic respiratory failure with hypoxia (HCC)   6. Crohn's disease with complication, unspecified gastrointestinal tract location [facility]) Chronic  7. Polycythemia vera (HCC) Chronic     Plan  Assessment and Plan    Diabetes Mellitus Type 2 Diabetes Mellitus Type 2 with HbA1c of 6.8%, indicating fair control. Managed with diet alone. Discussed potential need for medication if condition worsens. Patient experienced adverse effects from metformin and Victoza, including blood sugar fluctuations and vision problems. Emphasized monitoring blood pressure and cholesterol due to diabetes. - Continue diet management - Monitor blood sugar levels regularly - check urine nephropathy screen and recheck in 3 months if positive. Would consider ace or [id number] at that time if remains positive - Discuss medication options if condition worsens -  Monitor blood pressure and cholesterol levels  Hypertension (secondary to Avastin) Elevated blood pressure potentially secondary to Avastin injections. Patient reports an increase in blood pressure by 15-20 points during rest and activity. Blood pressure is normalizing as Avastin clears from the  system. - Monitor blood pressure at home - Reassess blood pressure in three months  Hyperlipidemia Borderline high cholesterol with LDL at 127 mg/dL. Discussed potential need for cholesterol-lowering medication, especially given diabetes. Patient reluctant to take statins due to side effects. Discussed Zetia as an alternative. - Order cholesterol test - Discuss Zetia as an alternative to statins - Reassess cholesterol levels in three months  Proteinuria (suspected) Suspected proteinuria possibly related to Avastin injections. Patient reports foamy urine but no urine test confirming proteinuria. Discussed potential for Avastin to cause kidney damage and need for urine testing. Kidney function by blood work appears normal. - Order urine test for proteinuria - Reassess in three months if protein is present in urine  Neuropathy (suspected) Suspected neuropathy with leg pain and muscle atrophy. Nerve tests indicated abnormalities; lumbar spine MRI inconclusive. Awaiting further evaluation by neurologist. Discussed possibility of cervical spine issue or systemic disease. - Await MRI approval and neurologist results - Follow up with neurologist for further evaluation  Hamstring Injury Chronic hamstring pain following injury while pulling a dolly. Underwent physical therapy and orthopedic evaluation, with persistent pain and muscle atrophy noted. - Continue physical therapy - Follow up with orthopedic specialist as needed  General Health Maintenance Discussed importance of regular monitoring and preventive measures. - Order blood test - Order urine test - Schedule follow-up appointment in three months  Follow-up - Schedule follow-up appointment in three months - Review test results and send a message to the patient.   I spent a total of 48 minutes for this patient encounter. Time spent included preparation, face-to-face counseling with the patient and coordination of care, review of chart  and records, and documentation of the encounter.     Orders Placed This Encounter  Procedures   Lipid panel   Microalbumin / creatinine urine ratio   No orders of the defined types were placed in this encounter.     Immunization History  Administered Date(s) Administered   Fluad Quad(high Dose 65+) 10/07/2019, 09/09/2020, 11/27/2021, 09/19/2022   Fluad Trivalent(High Dose 65+) 09/25/2023   Influenza, High Dose Seasonal PF 08/28/2018   Influenza-Unspecified 11/23/2017   PFIZER Comirnaty(Gray Top)Covid-19 Tri-Sucrose Vaccine 09/19/2022   PFIZER(Purple Top)SARS-COV-2 Vaccination 02/16/2020, 03/17/2020, 12/14/2020, 05/14/2021, 10/27/2021   Pneumococcal Conjugate-13 02/13/2018   Pneumococcal Polysaccharide-23 03/12/2019   Respiratory Syncytial Virus Vaccine,Recomb Aduvanted(Arexvy) 09/24/2022    Diabetes Related Lab Review: Lab Results  Component Value Date   HGBA1C 6.8 (H) 11/14/2023   HGBA1C 6.8 (H) 11/25/2022   HGBA1C 6.2 (A) 01/31/2022    No results found for: "MICROALBUR", "[id number]" Lab Results  Component Value Date   CREATININE 0.71 11/14/2023   BUN 20 11/14/2023   NA 141 11/14/2023   K 4.2 11/14/2023   CL 102 11/14/2023   CO2 23 11/14/2023   Lab Results  Component Value Date   CHOL 190 11/25/2022   CHOL 190 02/01/2021   CHOL 213 (H) 04/29/2020   Lab Results  Component Value Date   HDL 55.70 11/25/2022   HDL 55.60 02/01/2021   HDL 56.30 04/29/2020   Lab Results  Component Value Date   LDLCALC 117 (H) 11/25/2022   LDLCALC 120 (H) 02/01/2021   LDLCALC 128 (H) 04/29/2020   Lab Results  Component Value  Date   TRIG 85.0 11/25/2022   TRIG 75.0 02/01/2021   TRIG 143.0 04/29/2020   Lab Results  Component Value Date   CHOLHDL 3 11/25/2022   CHOLHDL 3 02/01/2021   CHOLHDL 4 04/29/2020   No results found for: "LDLDIRECT" The 10-year ASCVD risk score (Arnett DK, et al., 2019) is: 31.1%   Values used to calculate the score:     Age: 45 years     Sex:  Female     Is Non-Hispanic African American: No     Diabetic: Yes     Tobacco smoker: No     Systolic Blood Pressure: 130 mmHg     Is BP treated: Yes     HDL Cholesterol: 55.7 mg/dL     Total Cholesterol: 190 mg/dL I have reviewed the PMH, Fam and Soc history. Patient Active Problem List   Diagnosis Date Noted Date Diagnosed   Type 2 diabetes mellitus with hyperglycemia, without long-term current use of insulin (HCC) 12/04/2023     Priority: High   Prediabetes 02/01/2021     Priority: High   Sleep disorder 01/10/2020     Priority: High    Uses klonopin for sleep since 2014; sleep walking. And anxiety    Pernicious anemia 01/10/2020     Priority: High   Chronic prescription benzodiazepine use 01/10/2020     Priority: High   Morbid obesity (HCC) 09/24/2019     Priority: High   History of DVT (deep vein thrombosis), with PE 07/25/2019     Priority: High   OSA on CPAP      Priority: High    HST 06/01/17- AHI 33.8/ hr, desaturation to 69%, body weight 240 lbs   CPAP to 8    Crohn disease (HCC) 06/15/2017     Priority: High   ILD (interstitial lung disease) (HCC) 05/29/2017     Priority: High   Osteopenia 08/05/2020     Priority: Medium     H/o pred use Dexa 07/2020: lowest T left femur -2.2; rec cal vit d and exercise. DEXA 04/2023: Lowest T left femur -1.9, continue calcium vitamin D and exercise.  Recheck 2 to 3 years.    Adjustment disorder with anxiety 04/29/2020     Priority: Medium    Dyslipidemia 09/24/2019     Priority: Medium    History of polycythemia vera 11/12/2014     Priority: Medium    Chronic allergic rhinitis 01/10/2020     Priority: Low   Gait abnormality 11/14/2023    Low back pain 08/03/2023    Pulmonary emphysema with fibrosis of lung (HCC) 01/31/2022    Sclerosing adenosis of right breast 02/04/2021    Benign neoplasm of rectum     Chronic respiratory failure with hypoxia (HCC) 05/29/2017    Anemia 12/25/2015    Complications due to internal  joint prosthesis (HCC) 11/12/2014    Deep venous thrombosis (HCC) 11/12/2014    Polycythemia vera (HCC) 11/12/2014     Social History: Patient  reports that she quit smoking about 14 years ago. Her smoking use included cigarettes. She started smoking about 59 years ago. She has a 45 pack-year smoking history. She has never used smokeless tobacco. She reports current alcohol use. She reports that she does not use drugs.  Review of Systems: Ophthalmic: negative for eye pain, loss of vision or double vision Cardiovascular: negative for chest pain Respiratory: negative for SOB or persistent cough Gastrointestinal: negative for abdominal pain Genitourinary: negative for dysuria or gross  hematuria MSK: negative for foot lesions Neurologic: negative for weakness or gait disturbance  Objective  Vitals: BP 130/88   Pulse 92   Temp 98.3 F (36.8 C)   Ht 5\' 1"  (1.549 m)   Wt 262 lb 3.2 oz (118.9 kg)   SpO2 95%   BMI 49.54 kg/m  General: well appearing, no acute distress  Psych:  Alert and oriented, normal mood and affect HEENT:  Normocephalic, atraumatic, moist mucous membranes, supple neck  Cardiovascular:  Nl S1 and S2, RRR without murmur, gallop or rub. no edema Respiratory:  Good breath sounds bilaterally, CTAB with normal effort, no rales Neurologic:   Mental status is normal. normal gait Foot exam: no erythema, pallor, or cyanosis visible, some swelling +2 distal pulses bilaterally    Diabetic education: ongoing education regarding chronic disease management for diabetes was given today. We continue to reinforce the ABC's of diabetic management: A1c (<7 or 8 dependent upon patient), tight blood pressure control, and cholesterol management with goal LDL < 100 minimally. We discuss diet strategies, exercise recommendations, medication options and possible side effects. At each visit, we review recommended immunizations and preventive care recommendations for diabetics and stress that  good diabetic control can prevent other problems. See below for this patient's data.   Commons side effects, risks, benefits, and alternatives for medications and treatment plan prescribed today were discussed, and the patient expressed understanding of the given instructions. Patient is instructed to call or message via MyChart if he/she has any questions or concerns regarding our treatment plan. No barriers to understanding were identified. We discussed Red Flag symptoms and signs in detail. Patient expressed understanding regarding what to do in case of urgent or emergency type symptoms.  Medication list was reconciled, printed and provided to the patient in AVS. Patient instructions and summary information was reviewed with the patient as documented in the AVS. This note was prepared with assistance of Dragon voice recognition software. Occasional wrong-word or sound-a-like substitutions may have occurred due to the inherent limitations of voice recognition software

## 2023-12-05 ENCOUNTER — Encounter: Payer: Self-pay | Admitting: Cardiovascular Disease

## 2023-12-05 MED ORDER — POTASSIUM CHLORIDE ER 10 MEQ PO CPCR: 10.0000 meq | ORAL_CAPSULE | Freq: Every day | ORAL | 3 refills | Status: AC | PRN

## 2023-12-26 ENCOUNTER — Ambulatory Visit
Admission: RE | Admit: 2023-12-26 | Discharge: 2023-12-26 | Disposition: A | Discharge status: Home / Self Care | Payer: Medicare Other | Source: Ambulatory Visit | Attending: Neurology | Admitting: Neurology

## 2023-12-26 DIAGNOSIS — R269 Unspecified abnormalities of gait and mobility: Secondary | ICD-10-CM

## 2023-12-26 DIAGNOSIS — M62838 Other muscle spasm: Secondary | ICD-10-CM | POA: Diagnosis not present

## 2023-12-26 DIAGNOSIS — M542 Cervicalgia: Secondary | ICD-10-CM | POA: Diagnosis not present

## 2023-12-26 DIAGNOSIS — M25519 Pain in unspecified shoulder: Secondary | ICD-10-CM | POA: Diagnosis not present

## 2023-12-28 ENCOUNTER — Telehealth: Payer: Self-pay | Admitting: Neurology

## 2023-12-28 DIAGNOSIS — M62838 Other muscle spasm: Secondary | ICD-10-CM

## 2023-12-28 DIAGNOSIS — R269 Unspecified abnormalities of gait and mobility: Secondary | ICD-10-CM

## 2023-12-28 NOTE — Telephone Encounter (Signed)
Contacted pt, LVM rq call back  

## 2023-12-28 NOTE — Telephone Encounter (Signed)
 Please call patient  MRI of cervical spine showed mild multilevel degenerative changes.  But there was no evidence of spinal cord or nerve root compression.  I have ordered MRI of the brain, and thoracic spine for further evaluations.  Orders Placed This Encounter  Procedures   MR BRAIN WO CONTRAST   MR THORACIC SPINE WO CONTRAST

## 2023-12-30 NOTE — Progress Notes (Signed)
 HPI F former smoker followed for OSA, Insomnia complicated by ILD(Dr Geronimo), PAH WHO Group 3, Chronic Respiratory Failure with Hypoxia, Aortic Atherosclerosis, dCHF, CAD, hx DVT/PE, Allergic Rhinitis, Crohn's,  O2 3L Has been referred to [facility] and to Palliative Care HST 06/01/17- AHI 33.8/ hr, desaturation to 69%, body weight 240 lbs   CPAP to 8 ===========================================================================  .  01/10/23- 71 yoF former smoker followed for OSA, Insomnia complicated by ILD(Dr Geronimo), PAH WHO Group 3, Chronic Respiratory Failure with Hypoxia, Aortic Atherosclerosis, dCHF, CAD, hx DVT/PE, Allergic Rhinitis, Crohn's,  O2 3L Has been referred to [facility] and to Palliative Care CPAP auto 5-15/  Adapt       Airsense 10 replaced   09/15/22 Download compliance-100%, AHI 0.8/ hr Body weight today-265 lbs Covid vax-6 Phizer Flu vax-had -----Pt is doing okay  Download reviewed.  She is comfortable with CPAP and continues to blend-in oxygen  2L for sleep.  Has home oxygen  on 4 L when awake increasing to 6 L pulse for exertion..  She has scheduled follow-up with Dr. Geronimo for her ILD. CPAP was replaced successfully last Fall.SABRA HRCT 03/31/22 (Dr Geronimo) IMPRESSION: 1. Mild, bland appearing bandlike scarring of the bilateral lung bases, unchanged compared to recent prior examination. No evidence of fibrotic interstitial lung disease. 2. Coronary artery disease. 3. Enlargement of the main pulmonary artery, as can be seen in pulmonary hypertension. 4. Coarse, nodular contour of the partially imaged liver in the upper abdomen, suggesting cirrhosis. Aortic Atherosclerosis (ICD10-I70.0).  12/31/22-  15 yoF former smoker followed for OSA, Insomnia complicated by ILD(Dr Geronimo), PAH WHO Group 3, Chronic Respiratory Failure with Hypoxia, Aortic Atherosclerosis, dCHF, CAD, hx DVT/PE, Allergic Rhinitis, Crohn's,  O2 2L Has been referred to [facility] and to  Palliative Care CPAP auto 5-15/  Adapt       Airsense 10 replaced   09/15/22 Download compliance-97%, AHI 0.8/hr Body weight today- 262 lbs I follow for OSA. Dr Geronimo follows for ILD, PAH. Arrival O12 sat 93% on room air. Has her O2 with her. Discussed the use of AI scribe software for clinical note transcription with the patient, who gave verbal consent to proceed.  History of Present Illness   The patient, with a history of interstitial lung disease and sleep apnea, presents with sleep issues and nerve system issues. She reports having trouble falling asleep, often remaining in a twilight state for extended periods before finally falling asleep. She has been taking Klonopin  to aid sleep, but recently increased the dose due to persistent sleep issues, which led to balance issues. She is now trying to wean off Klonopin , which may have affected her sleep patterns in the past month or two. She also reports nerve system issues, describing a sensation of nerve endings being on the edge of her skin. She is currently seeing a neurologist for diagnosis. We agreed not to change sleep med now, during Neurology w/u, and she can ask them for advice.  In addition, the patient uses a CPAP machine for sleep apnea and reports good control of apneas with the machine. She also uses supplemental oxygen  while sleeping. She has been experiencing some issues with waking up after about four hours of sleep and then being unable to fall back asleep for a few hours. She has been trying to manage this by turning off the CPAP machine when she wakes up in the middle of the night.   She is following with Dr Geronimo for her ILD and chronic hypoxic respiratory failure, and  says he wanted on overnight oximetry.  We can order that to be ready when he sees her next in April.     Constitutional:    weight loss, night sweats, fevers, chills, fatigue, lassitude. HEENT:    headaches, difficulty swallowing, tooth/dental problems,  sore throat,       sneezing, +itching, ear ache, +nasal congestion, post nasal drip, snoring CV:    chest pain, orthopnea, PND, swelling in lower extremities, anasarca, dizziness, palpitations Resp:   +shortness of breath with exertion or at rest.                productive cough,   non-productive cough, coughing up of blood.              change in color of mucus.  wheezing.   Skin:    rash or lesions. GI:  No-   heartburn, indigestion, abdominal pain, nausea, vomiting, diarrhea,                 change in bowel habits, loss of appetite GU: dysuria, change in color of urine, no urgency or frequency.   flank pain. MS:   joint pain, stiffness, decreased range of motion, back pain. Neuro-     nothing unusual Psych:  change in mood or affect.  depression or +anxiety.   memory loss.  OBJ- Physical Exam General- Alert, Oriented, Affect-appropriate, Distress- none acute, + obese, +room air here, put O2 on as she left. Skin- rash-none, lesions- none, excoriation- none Lymphadenopathy- none Head- atraumatic   +Xanthelasma            Eyes- Gross vision intact, PERRLA, conjunctivae and secretions clear            Ears- Hearing, canals-normal            Nose- Clear, no-Septal dev, mucus, polyps, erosion, perforation             Throat- Mallampati IV , mucosa clear , drainage- none, tonsils+, teeth+ Neck- flexible , trachea midline, no stridor , thyroid  nl, carotid no bruit Chest - symmetrical excursion , unlabored           Heart/CV- RRR , no murmur , no gallop  , no rub, nl s1 s2                           - JVD- none , edema- none, stasis changes- none, varices- none           Lung- + crackles, wheeze- none, cough- none , dullness-none, rub- none           Chest wall-  Abd-  Br/ Gen/ Rectal- Not done, not indicated Extrem- cyanosis- none, clubbing, none, atrophy- none, strength- nl Neuro- grossly intact to observation  Assessment and Plan    Sleep Apnea Good control with CPAP. Patient reports  difficulty falling asleep and staying asleep. Currently taking Klonopin  0.5mg  for sleep, but experiencing balance issues. Neurologist is evaluating for possible peripheral neuritis. -Continue CPAP as tolerated. -Discuss sleep issues with neurologist at next appointment.  Interstitial Lung Disease Patient is followed by Dr. Geronimo. Oxygen  saturation is 93% on room air. Patient uses oxygen  with CPAP at night. -Order overnight oximetry on CPAP plus 2 liters of oxygen  to assess need for increased oxygen  during sleep. -Continue current management under Dr. Geronimo.  Peripheral Neuritis Patient reports nerve issues, currently under evaluation by a neurologist. -Continue evaluation and management under neurologist.   Morbid Obesity -  encouraged to work on [employer]

## 2024-01-01 ENCOUNTER — Encounter: Payer: Self-pay | Admitting: Internal Medicine

## 2024-01-01 ENCOUNTER — Ambulatory Visit: Payer: Medicare Other | Admitting: Internal Medicine

## 2024-01-01 VITALS — BP 140/80 | HR 88 | Ht 63.0 in | Wt 262.6 lb

## 2024-01-01 DIAGNOSIS — G4733 Obstructive sleep apnea (adult) (pediatric): Secondary | ICD-10-CM | POA: Diagnosis not present

## 2024-01-01 DIAGNOSIS — J849 Interstitial pulmonary disease, unspecified: Secondary | ICD-10-CM | POA: Diagnosis not present

## 2024-01-01 NOTE — Patient Instructions (Addendum)
 Order- schedule overnight oximetry wearing CPAP with O2 2L    Dx Chronic respiratory failure with hypoxia  You can discuss these results with Dr Geronimo at your next visit.  We can continue CPAP 5-15 with O2 2L for sleep.  I hope the Neurologist can help with the issues you notice.

## 2024-01-01 NOTE — Telephone Encounter (Signed)
 Pt returning call. Advised of previous note. Pt states she was called and scheduled for MRI on 1/21. She had no further questions

## 2024-01-03 ENCOUNTER — Encounter: Payer: Self-pay | Admitting: Family Medicine

## 2024-01-03 MED ORDER — AMOXICILLIN 500 MG PO CAPS: 2000.0000 mg | ORAL_CAPSULE | Freq: Once | ORAL | 1 refills | Status: DC | PRN

## 2024-01-03 MED ORDER — FLUOXETINE HCL 20 MG PO CAPS: 20.0000 mg | ORAL_CAPSULE | Freq: Every day | ORAL | 3 refills | Status: DC

## 2024-01-03 MED ORDER — CLONAZEPAM 0.5 MG PO TABS: 0.5000 mg | ORAL_TABLET | Freq: Every day | ORAL | 1 refills | Status: DC | PRN

## 2024-01-16 ENCOUNTER — Ambulatory Visit
Admission: RE | Admit: 2024-01-16 | Discharge: 2024-01-16 | Disposition: A | Discharge status: Home / Self Care | Payer: Medicare Other | Source: Ambulatory Visit | Attending: Neurology | Admitting: Neurology

## 2024-01-16 DIAGNOSIS — R269 Unspecified abnormalities of gait and mobility: Secondary | ICD-10-CM | POA: Diagnosis not present

## 2024-01-16 DIAGNOSIS — M62838 Other muscle spasm: Secondary | ICD-10-CM | POA: Diagnosis not present

## 2024-01-17 ENCOUNTER — Telehealth: Payer: Self-pay | Admitting: Neurology

## 2024-01-17 ENCOUNTER — Encounter: Payer: Self-pay | Admitting: Neurology

## 2024-01-17 NOTE — Telephone Encounter (Signed)
I called and left message to patient, MRI of the brain and cervical, thoracic spine showed no structural abnormality to explain her complaints of muscle spasm,  If she wished, may consider EMG nerve conduction study,

## 2024-01-19 ENCOUNTER — Encounter: Payer: Self-pay | Admitting: Internal Medicine

## 2024-01-19 DIAGNOSIS — J9611 Chronic respiratory failure with hypoxia: Secondary | ICD-10-CM

## 2024-01-22 ENCOUNTER — Encounter: Payer: Self-pay | Admitting: Neurology

## 2024-01-22 DIAGNOSIS — M62838 Other muscle spasm: Secondary | ICD-10-CM

## 2024-01-22 DIAGNOSIS — R269 Unspecified abnormalities of gait and mobility: Secondary | ICD-10-CM

## 2024-01-23 NOTE — Telephone Encounter (Signed)
Per Dr. Roxy Cedar last office note,  Order- schedule overnight oximetry wearing CPAP with O2 2L    Dx Chronic respiratory failure with hypoxia      Order has been placed.  Nothing further needed.

## 2024-01-25 ENCOUNTER — Telehealth: Payer: Self-pay | Admitting: Neurology

## 2024-01-25 DIAGNOSIS — M62838 Other muscle spasm: Secondary | ICD-10-CM | POA: Insufficient documentation

## 2024-01-25 MED ORDER — GABAPENTIN 100 MG PO CAPS: 300.0000 mg | ORAL_CAPSULE | Freq: Every day | ORAL | 11 refills | Status: AC

## 2024-01-25 NOTE — Telephone Encounter (Signed)
Nightly muscle spasm, she is tking clonazep 0.5mg  1/2 tabs almost nightly,     .yangm  25 minutes.

## 2024-01-25 NOTE — Telephone Encounter (Signed)
Call to patiet, Patty [name]. Advised Dr. Terrace Arabia has sent messages via mychart and I would follow back up with Dr. Terrace Arabia. Advised to call office back.

## 2024-01-25 NOTE — Telephone Encounter (Signed)
Pt states since November she has had 3 MRI's and blood test, she has not been contacted with any results or diagnosis, pt is asking to be called with updates.

## 2024-01-25 NOTE — Telephone Encounter (Signed)
Call to patient, lVM,  Patietn returned call, call transferred to Dr. Terrace Arabia

## 2024-01-25 NOTE — Telephone Encounter (Signed)
Failed to reach patient, left message for her to call back, please try to reach her

## 2024-03-04 ENCOUNTER — Encounter: Payer: Self-pay | Admitting: Family Medicine

## 2024-03-04 ENCOUNTER — Ambulatory Visit (INDEPENDENT_AMBULATORY_CARE_PROVIDER_SITE_OTHER): Payer: Medicare Other | Admitting: Family Medicine

## 2024-03-04 VITALS — BP 130/86 | HR 97 | Temp 97.7°F | Ht 63.0 in | Wt 262.0 lb

## 2024-03-04 DIAGNOSIS — K50919 Crohn's disease, unspecified, with unspecified complications: Secondary | ICD-10-CM

## 2024-03-04 DIAGNOSIS — E1165 Type 2 diabetes mellitus with hyperglycemia: Secondary | ICD-10-CM

## 2024-03-04 DIAGNOSIS — I2723 Pulmonary hypertension due to lung diseases and hypoxia: Secondary | ICD-10-CM | POA: Insufficient documentation

## 2024-03-04 DIAGNOSIS — E785 Hyperlipidemia, unspecified: Secondary | ICD-10-CM

## 2024-03-04 DIAGNOSIS — E1169 Type 2 diabetes mellitus with other specified complication: Secondary | ICD-10-CM

## 2024-03-04 DIAGNOSIS — J9611 Chronic respiratory failure with hypoxia: Secondary | ICD-10-CM

## 2024-03-04 DIAGNOSIS — D45 Polycythemia vera: Secondary | ICD-10-CM

## 2024-03-04 LAB — POCT GLYCOSYLATED HEMOGLOBIN (HGB A1C): Hemoglobin A1C: 6.1 % — AB (ref 4.0–5.6)

## 2024-03-04 NOTE — Patient Instructions (Addendum)
 Please return in 6 months for diabetes and blood pressure recheck.   If you have any questions or concerns, please don't hesitate to send me a message via MyChart or call the office at [phone]. Thank you for visiting with Patty [name] today! It's our pleasure caring for you.   VISIT SUMMARY:  During your visit, we discussed your concerns about the side effects from Avastin injections for macular degeneration, your improved diabetes management, blood pressure, cholesterol levels, and neuropathic pain. We also reviewed your general health maintenance and follow-up plans.  YOUR PLAN:  -MACULAR DEGENERATION: Macular degeneration is an eye condition that can cause vision loss. You experienced severe side effects from Avastin injections, including sluggishness, tiredness, breathing problems, and Crohn's-like pains. You have stopped Avastin and are now seeing a new eye doctor who has not given you any injections for over three months. Continue with the current management under your new ophthalmologist.  -TYPE 2 DIABETES MELLITUS: Type 2 diabetes is a condition where your body does not use insulin properly, leading to high blood sugar levels. Your blood sugar control has improved, with your HbA1c dropping from 6.8 to 6.1, possibly due to stopping Avastin injections. Continue with your current diabetes management and monitor your HbA1c levels.  -HYPERTENSION: Hypertension is high blood pressure. Your blood pressure is decreasing but still mildly elevated. Continue to monitor your blood pressure at home.  -HYPERLIPIDEMIA: Hyperlipidemia is having high levels of cholesterol in your blood. You have chosen not to take additional medication for this. Continue with your current management and monitor your cholesterol levels.  -NEUROPATHIC PAIN: Neuropathic pain is pain caused by nerve damage. You have been experiencing nerve twitching and pain, which is being managed with Gabapentin. Continue taking Gabapentin as  needed.  INSTRUCTIONS:  Please collect a urine sample at your next visit to recheck your microalbumin/creatinine ratio. Follow up in six months to monitor your diabetes control and overall health.

## 2024-03-04 NOTE — Progress Notes (Signed)
 Subjective  CC:  Chief Complaint  Patient presents with   Diabetes    HPI: Patty [name] is a 73 y.o. female who presents to the office today for follow up of diabetes and problems listed above in the chief complaint.  Diabetes follow up: Her diabetic control is reported as Improved. Now off avestin injections and this could be helping. Diet is fair.  She denies exertional CP or SOB or symptomatic hypoglycemia. She denies foot sores or paresthesias. Discussed urine microalbuminuria ratio error: pt defers retesting today. Levels are low and suspect ratio will remain < 30. Not on ace nor sglt-21 Elevated diastolic bp: home readings continue to trend downwards off avestin.  HLD: cards aware. Declines statin. Declines zetia.  Lung disease per pulm Reviewed neuro notes. Now on gabapentin.  Wt Readings from Last 3 Encounters:  03/04/24 262 lb (118.8 kg)  01/01/24 262 lb 9.6 oz (119.1 kg)  12/04/23 262 lb 3.2 oz (118.9 kg)    BP Readings from Last 3 Encounters:  03/04/24 130/86  01/01/24 (!) 140/80  12/04/23 130/88    Assessment  1. Type 2 diabetes mellitus with hyperglycemia, without long-term current use of insulin (HCC)   2. Hyperlipidemia associated with type 2 diabetes mellitus (HCC)   3. WHO group 3 pulmonary arterial hypertension (HCC) Chronic  4. Crohn's disease with complication, unspecified gastrointestinal tract location [facility]) Chronic  5. Chronic respiratory failure with hypoxia (HCC) Chronic  6. Morbid obesity (HCC) Chronic  7. Polycythemia vera (HCC) Chronic     Plan  Assessment and Plan Macular Degeneration Patient reported severe side effects from Avastin injections, including increased blood sugar, sluggishness, breathing problems, and Crohn's-like pains. Patient has since stopped Avastin and is seeing a new ophthalmologist who has not administered any injections for over three months. -Continue current management under new ophthalmologist.  Type 2 Diabetes  Mellitus Improved control with HbA1c of 6.1, down from previous 6.8. This improvement may be related to cessation of Avastin injections. -Continue current management and monitor HbA1c. Diet controlled  Hypertension Patient reports blood pressure is decreasing but still elevated. -Continue to monitor blood pressure at home.  Hyperlipidemia Patient declined additional medication for cholesterol management. -Continue current management and monitor cholesterol levels.  Neuropathic Pain Patient reports nerve twitching and pain, currently managed with Gabapentin. -Continue Gabapentin as needed.  General Health Maintenance / Followup Plans -Collect urine sample at next visit to recheck microalbumin/creatinine ratio. -Follow-up in six months to monitor diabetes control and overall health.  Follow up: 6 mo for recheck diabetes, urine testing and blood pressure Orders Placed This Encounter  Procedures   POCT HgB A1C   No orders of the defined types were placed in this encounter.     Immunization History  Administered Date(s) Administered   Fluad Quad(high Dose 65+) 10/07/2019, 09/09/2020, 11/27/2021, 09/19/2022   Fluad Trivalent(High Dose 65+) 09/25/2023   Influenza, High Dose Seasonal PF 08/28/2018   Influenza-Unspecified 11/23/2017   PFIZER Comirnaty(Gray Top)Covid-19 Tri-Sucrose Vaccine 09/19/2022   PFIZER(Purple Top)SARS-COV-2 Vaccination 02/16/2020, 03/17/2020, 12/14/2020, 05/14/2021, 10/27/2021   Pfizer(Comirnaty)Fall Seasonal Vaccine 12 years and older 09/21/2023   Pneumococcal Conjugate-13 02/13/2018   Pneumococcal Polysaccharide-23 03/12/2019   Respiratory Syncytial Virus Vaccine,Recomb Aduvanted(Arexvy) 09/24/2022    Diabetes Related Lab Review: Lab Results  Component Value Date   HGBA1C 6.8 (H) 11/14/2023   HGBA1C 6.8 (H) 11/25/2022   HGBA1C 6.2 (A) 01/31/2022    Lab Results  Component Value Date   MICROALBUR 2.9 (H) 12/04/2023   Lab  Results  Component Value  Date   CREATININE 0.71 11/14/2023   BUN 20 11/14/2023   NA 141 11/14/2023   K 4.2 11/14/2023   CL 102 11/14/2023   CO2 23 11/14/2023   Lab Results  Component Value Date   CHOL 203 (H) 12/04/2023   CHOL 190 11/25/2022   CHOL 190 02/01/2021   Lab Results  Component Value Date   HDL 56.30 12/04/2023   HDL 55.70 11/25/2022   HDL 55.60 02/01/2021   Lab Results  Component Value Date   LDLCALC 131 (H) 12/04/2023   LDLCALC 117 (H) 11/25/2022   LDLCALC 120 (H) 02/01/2021   Lab Results  Component Value Date   TRIG 78.0 12/04/2023   TRIG 85.0 11/25/2022   TRIG 75.0 02/01/2021   Lab Results  Component Value Date   CHOLHDL 4 12/04/2023   CHOLHDL 3 11/25/2022   CHOLHDL 3 02/01/2021   No results found for: "LDLDIRECT" The 10-year ASCVD risk score (Arnett DK, et al., 2019) is: 31.4%   Values used to calculate the score:     Age: 11 years     Sex: Female     Is Non-Hispanic African American: No     Diabetic: Yes     Tobacco smoker: No     Systolic Blood Pressure: 130 mmHg     Is BP treated: Yes     HDL Cholesterol: 56.3 mg/dL     Total Cholesterol: 203 mg/dL I have reviewed the PMH, Fam and Soc history. Patient Active Problem List   Diagnosis Date Noted Date Diagnosed   Type 2 diabetes mellitus with hyperglycemia, without long-term current use of insulin (HCC) 12/04/2023     Priority: High   Sleep disorder 01/10/2020     Priority: High    Uses klonopin for sleep since 2014; sleep walking. And anxiety    Pernicious anemia 01/10/2020     Priority: High   Chronic prescription benzodiazepine use 01/10/2020     Priority: High   Morbid obesity (HCC) 09/24/2019     Priority: High   History of DVT (deep vein thrombosis), with PE 07/25/2019     Priority: High   OSA on CPAP      Priority: High    HST 06/01/17- AHI 33.8/ hr, desaturation to 69%, body weight 240 lbs   CPAP to 8    Crohn disease (HCC) 06/15/2017     Priority: High   ILD (interstitial lung disease) (HCC)  05/29/2017     Priority: High   Osteopenia 08/05/2020     Priority: Medium     H/o pred use Dexa 07/2020: lowest T left femur -2.2; rec cal vit d and exercise. DEXA 04/2023: Lowest T left femur -1.9, continue calcium vitamin D and exercise.  Recheck 2 to 3 years.    Adjustment disorder with anxiety 04/29/2020     Priority: Medium    Combined hyperlipidemia associated with type 2 diabetes mellitus (HCC) 09/24/2019     Priority: Medium    History of polycythemia vera 11/12/2014     Priority: Medium    Chronic allergic rhinitis 01/10/2020     Priority: Low   WHO group 3 pulmonary arterial hypertension (HCC) 03/04/2024    Muscle spasm 01/25/2024    Gait abnormality 11/14/2023    Low back pain 08/03/2023    Pulmonary emphysema with fibrosis of lung (HCC) 01/31/2022    Sclerosing adenosis of right breast 02/04/2021    Benign neoplasm of rectum     Chronic  respiratory failure with hypoxia (HCC) 05/29/2017    Anemia 12/25/2015    Complications due to internal joint prosthesis (HCC) 11/12/2014    Deep venous thrombosis (HCC) 11/12/2014    Polycythemia vera (HCC) 11/12/2014     Social History: Patient  reports that she quit smoking about 15 years ago. Her smoking use included cigarettes. She started smoking about 60 years ago. She has a 45 pack-year smoking history. She has never used smokeless tobacco. She reports current alcohol use. She reports that she does not use drugs.  Review of Systems: Ophthalmic: negative for eye pain, loss of vision or double vision Cardiovascular: negative for chest pain Respiratory: negative for SOB or persistent cough Gastrointestinal: negative for abdominal pain Genitourinary: negative for dysuria or gross hematuria MSK: negative for foot lesions Neurologic: negative for weakness or gait disturbance  Objective  Vitals: BP 130/86   Pulse 97   Temp 97.7 F (36.5 C)   Ht 5\' 3"  (1.6 m)   Wt 262 lb (118.8 kg)   SpO2 95%   BMI 46.41 kg/m  General:  well appearing, no acute distress  Psych:  Alert and oriented, normal mood and affect HEENT:  Normocephalic, atraumatic, moist mucous membranes, supple neck  Cardiovascular:  Nl S1 and S2, RRR without murmur, gallop or rub. no edema Respiratory:  Good breath sounds bilaterally, CTAB with normal effort, no rales   Diabetic education: ongoing education regarding chronic disease management for diabetes was given today. We continue to reinforce the ABC's of diabetic management: A1c (<7 or 8 dependent upon patient), tight blood pressure control, and cholesterol management with goal LDL < 100 minimally. We discuss diet strategies, exercise recommendations, medication options and possible side effects. At each visit, we review recommended immunizations and preventive care recommendations for diabetics and stress that good diabetic control can prevent other problems. See below for this patient's data.   Commons side effects, risks, benefits, and alternatives for medications and treatment plan prescribed today were discussed, and the patient expressed understanding of the given instructions. Patient is instructed to call or message via MyChart if he/she has any questions or concerns regarding our treatment plan. No barriers to understanding were identified. We discussed Red Flag symptoms and signs in detail. Patient expressed understanding regarding what to do in case of urgent or emergency type symptoms.  Medication list was reconciled, printed and provided to the patient in AVS. Patient instructions and summary information was reviewed with the patient as documented in the AVS. This note was prepared with assistance of Dragon voice recognition software. Occasional wrong-word or sound-a-like substitutions may have occurred due to the inherent limitations of voice recognition software

## 2024-03-26 NOTE — Progress Notes (Signed)
 Cardiology Office Note    Date:  04/01/2024   ID:  Patty [name], DOB [birth year], MRN [id number]  PCP:  Willow Ora, MD  Cardiologist:  Charlton Haws, MD  Electrophysiologist:  None   Chief Complaint: f/u CHF  History of Present Illness:   Patty [name] is a 73 y.o. female with history of ILD on with chronic respiratory failure home O2 with associated emphysema on CT followed by pulmonology, anxiety, arthritis, pulmonary HTN, chronic diastolic CHF, Crohn's disease, HLD (managed by primary care), polycycthema vera, pre-DM, pernicious anemia, OSA (followed by pulm), coronary/aortic atherosclerosis by CT (negative cath 2018) who is seen for follow-up.   Echo 08/18/22 EF 60-65% grade 1 diastolic Normal RV mild TR  Myoview stress test 10/22/20 which showed no ischemia or infarct.  RHC 11/2020 showing moderate pulmonary HTN. RA 13/12 mean 9 mmHg RV 48/8, EDP 12 PA 50/29 mean 38 PCWP 22/20 mean 20 O2 saturations: PA 62, Ao 97, SVC 64 CO 4.26 L/m, CI 1.96 Trans-pulmonic gradient 18 mmHg PVR 4.2 Woods Units  She was subsequently started on Esbriet and Tyvaso by pulmonology. She did not tolerate the Esbriet but feels the Tyvaso has helped. Repeat CT chest 03/2022 showed mild scarring of bilateral lung bases, enlargement of the PA, scattered coronary/aortic atherosclerosis and possible cirrhosis. She has elected not to pursue any evaluation for the liver changes. ake her Lasix more regularly in the summer in the hotter weather.   She has macular degeneration and bad side effects with Avastin injections now stopped She uses gabapentin for neuropathy. Recent A1c improved 6.9-> 6.1 LDL 131 not interested in Rx Hct 46.6  Had MRI brain 12/2023 for gait abnormality and it was normal for age.   She confirms DNR No family but has work family that all moved down from [place] to work for [employer] years ago   Cardiology Studies:   Studies reviewed are outlined and summarized  above. Reports included below if pertinent.   RHC 11/2020 Hemodynamic findings consistent with moderate pulmonary hypertension.   RA 13/12 mean 9 mmHg RV 48/8, EDP 12 PA 50/29 mean 38 PCWP 22/20 mean 20   O2 saturations: PA 62, Ao 97, SVC 64   CO 4.26 L/m, CI 1.96 Trans-pulmonic gradient 18 mmHg PVR 4.2 Woods Units   2D Echo 06/2020  1. Left ventricular ejection fraction, by estimation, is 60 to 65%. The  left ventricle has normal function. The left ventricle has no regional  wall motion abnormalities. There is moderate concentric left ventricular  hypertrophy. Left ventricular  diastolic parameters are consistent with Grade I diastolic dysfunction  (impaired relaxation).   2. Although TAPSE is normal, visually RVF appears reduced. The right  ventricular size is normal.   3. The mitral valve is normal in structure. No evidence of mitral valve  regurgitation. No evidence of mitral stenosis.   4. The aortic valve is normal in structure. Aortic valve regurgitation is  not visualized. No aortic stenosis is present.   5. The inferior vena cava is normal in size with greater than 50%  respiratory variability, suggesting right atrial pressure of 3 mmHg.     L+RHC 2018 1. No angiographic evidence of CAD 2. Elevated filling pressures   Recommendations: No further ischemic workup. Her filling pressures are elevated. She may benefit from diuretic therapy if she becomes symptomatic.     Past Medical History:  Diagnosis Date   Abnormal nuclear stress test    a. 07/2017: cath with  no CAD   Anxiety    Aortic atherosclerosis (HCC)    Arthritis    Chronic diastolic CHF (congestive heart failure) (HCC)    Coronary artery calcification seen on CT scan    Crohn disease (HCC)    DVT (deep venous thrombosis) (HCC)    Dyspnea    Grade I diastolic dysfunction    Hyperlipidemia    ILD (interstitial lung disease) (HCC)    OSA on CPAP    CPAP    AND O2   Pernicious anemia    Polycythemia  vera (HCC)    Pulmonary embolism (HCC) 2010   s/p hip surgery , reports this was never truly confirmed    Pulmonary hypertension (HCC)    Sleep disorder 01/10/2020   Uses klonopin for sleep since 2014; sleep walking. And anxiety    Past Surgical History:  Procedure Laterality Date    RIGHT BREAST LUMPECTOMY WITH RADIOACTIVE SEED LOCALIZATION (Right Breast)  02/04/2021   BIOPSY  09/30/2019   Procedure: BIOPSY;  Surgeon: Beverley Fiedler, MD;  Location: [facility];  Service: Gastroenterology;;   BREAST LUMPECTOMY WITH RADIOACTIVE SEED LOCALIZATION Right 02/04/2021   Procedure: RIGHT BREAST LUMPECTOMY WITH RADIOACTIVE SEED LOCALIZATION;  Surgeon: Abigail Miyamoto, MD;  Location: [facility] OR;  Service: General;  Laterality: Right;   CARDIAC CATHETERIZATION     COLON RESECTION  1993   COLONOSCOPY WITH PROPOFOL N/A 09/30/2019   Procedure: COLONOSCOPY WITH PROPOFOL;  Surgeon: Beverley Fiedler, MD;  Location: [facility];  Service: Gastroenterology;  Laterality: N/A;   HIP ARTHROPLASTY Right    x 2, initial right THA, then subsequent right THA revision    LEFT HEART CATH AND CORONARY ANGIOGRAPHY N/A 07/28/2017   Procedure: Left Heart Cath and Coronary Angiography;  Surgeon: Kathleene Hazel, MD;  Location[facility] INVASIVE CV LAB;  Service: Cardiovascular;  Laterality: N/A;   POLYPECTOMY  09/30/2019   Procedure: POLYPECTOMY;  Surgeon: Beverley Fiedler, MD;  Location: [facility];  Service: Gastroenterology;;   RIGHT HEART CATH N/A 12/07/2020   Procedure: RIGHT HEART CATH;  Surgeon: Tonny Bollman, MD;  Location: [facility] INVASIVE CV LAB;  Service: Cardiovascular;  Laterality: N/A;    Current Medications: Current Meds  Medication Sig   albuterol (VENTOLIN HFA) 108 (90 Base) MCG/ACT inhaler Inhale 2 puffs into the lungs every 6 (six) hours as needed for wheezing or shortness of breath.   amoxicillin (AMOXIL) 500 MG capsule Take 4 capsules (2,000 mg total) by mouth once as needed for up to 1 dose. Before dental  procedures   aspirin EC 81 MG tablet Take 81 mg by mouth daily.   Calcium Carbonate-Vit D-Min (CALCIUM 1200) 1200-1000 MG-UNIT CHEW Chew 1 tablet by mouth daily.   CANNABIDIOL PO Take 1 Dose by mouth 2 (two) times daily as needed (anxiety/sleep.). CBD Oil   cetirizine (ZYRTEC) 10 MG tablet Take 10 mg by mouth daily as needed (sinus/allergies.).   cetirizine-pseudoephedrine (ZYRTEC-D) 5-120 MG tablet Take 1 tablet by mouth daily as needed (sinus headaches.).   clonazePAM (KLONOPIN) 0.5 MG tablet Take 1 tablet (0.5 mg total) by mouth daily as needed for anxiety.   cyanocobalamin (VITAMIN B12) 1000 MCG/ML injection Inject 1 mL (1,000 mcg total) into the muscle every 30 (thirty) days.   FLUoxetine (PROZAC) 20 MG capsule Take 1 capsule (20 mg total) by mouth daily.   fluticasone (FLONASE) 50 MCG/ACT nasal spray Place 1-2 sprays into both nostrils daily as needed for allergies or rhinitis.   Fluticasone-Umeclidin-Vilant (TRELEGY ELLIPTA) 100-62.5-25  MCG/ACT AEPB Inhale 1 puff into the lungs daily.   furosemide (LASIX) 20 MG tablet Take 1 tablet (20 mg total) by mouth as needed for fluid.   gabapentin (NEURONTIN) 100 MG capsule Take 3 capsules (300 mg total) by mouth at bedtime.   Glycerin-Polysorbate 80 (REFRESH DRY EYE THERAPY OP) Place 1 drop into both eyes daily.   hydrocortisone (PROCTOZONE-HC) 2.5 % rectal cream Place 1 application rectally 2 (two) times daily as needed (Crohn's flare).   ibuprofen (ADVIL) 200 MG tablet Take 400-800 mg by mouth every 8 (eight) hours as needed (pain.).   mesalamine (PENTASA) 250 MG CR capsule Take 250-500 mg by mouth 4 (four) times daily as needed (crohn's disease flare ups).   Multiple Vitamins-Minerals (ICAPS AREDS 2 PO) Take 1 tablet by mouth daily.   potassium chloride (MICRO-K) 10 MEQ CR capsule Take 1 capsule (10 mEq total) by mouth daily as needed (when taking furosemide).   prednisoLONE acetate (PRED FORTE) 1 % ophthalmic suspension Place 1 drop into both  eyes 2 (two) times daily as needed (Uveitis).   Probiotic Product (PROBIOTIC PO) Take 1 capsule by mouth 2 (two) times a week. As needed   SYRINGE-NEEDLE, DISP, 3 ML (LUER LOCK SAFETY SYRINGES) 25G X 1" 3 ML MISC USE AS DIRECTED   traMADol (ULTRAM) 50 MG tablet Take 1 tablet (50 mg total) by mouth every 6 (six) hours as needed.   Treprostinil (TYVASO REFILL) 0.6 MG/ML SOLN Inhale 54 mcg into the lungs in the morning, at noon, in the evening, and at bedtime.   triamcinolone cream (KENALOG) 0.1 % Apply 1 Application topically 2 (two) times daily.      Allergies:   Tizanidine hcl, Victoza [liraglutide], Metformin and related, Onion, Avastin [bevacizumab], Lasix [furosemide], Pirfenidone, Potassium-containing compounds, Prednisone, Sulfa drugs cross reactors, and Trelegy ellipta [fluticasone-umeclidin-vilant]   Social History   Socioeconomic History   Marital status: Single    Spouse name: Not on file   Number of children: 0   Years of education: Not on file   Highest education level: Associate degree: academic program  Occupational History   Occupation: retired  Tobacco Use   Smoking status: Former    Current packs/day: 0.00    Average packs/day: 1 pack/day for 45.0 years (45.0 ttl pk-yrs)    Types: Cigarettes    Start date: 12/27/1963    Quit date: 12/26/2008    Years since quitting: 15.2   Smokeless tobacco: Never  Vaping Use   Vaping status: Never Used  Substance and Sexual Activity   Alcohol use: Yes    Comment: occ   Drug use: No   Sexual activity: Not Currently  Other Topics Concern   Not on file  Social History Narrative   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    Social Drivers of Corporate investment banker Strain: Low Risk  (03/03/2024)   Overall Financial Resource Strain (CARDIA)    Difficulty of Paying Living Expenses: Not hard at all  Food Insecurity: No Food Insecurity (03/03/2024)   Hunger Vital Sign    Worried About  Running Out of Food in the Last Year: Never true    Ran Out of Food in the Last Year: Never true  Transportation Needs: No Transportation Needs (03/03/2024)   PRAPARE - Administrator, Civil Service (Medical): No    Lack of Transportation (Non-Medical): No  Physical Activity: Sufficiently Active (03/03/2024)   Exercise Vital Sign  Days of Exercise per Week: 2 days    Minutes of Exercise per Session: 120 min  Stress: No Stress Concern Present (03/03/2024)   [employer] of Occupational Health - Occupational Stress Questionnaire    Feeling of Stress : Not at all  Social Connections: Moderately Integrated (03/03/2024)   Social Connection and Isolation Panel [NHANES]    Frequency of Communication with Friends and Family: Twice a week    Frequency of Social Gatherings with Friends and Family: Once a week    Attends Religious Services: More than 4 times per year    Active Member of [employer] or Organizations: Yes    Attends Banker Meetings: 1 to 4 times per year    Marital Status: Never married     Family History:  The patient's family history includes Anxiety disorder in her father; COPD in her brother; Diabetes Mellitus II in her brother and father; Glaucoma in her brother and mother; Heart disease in her brother; High Cholesterol in her mother; High blood pressure in her mother; Non-Hodgkin's lymphoma in her brother; Sleep apnea in her father and mother. There is no history of Colon cancer, Esophageal cancer, Pancreatic cancer, Stomach cancer, or Liver disease.  ROS:   Please see the history of present illness.  All other systems are reviewed and otherwise negative.    EKG(s)/Additional Labs   EKG:  EKG is ordered today, personally reviewed, demonstrating NSR 88bpm, LAD, poor R wave progression with generally lower voltage  Recent Labs: 11/14/2023: ALT 17; BUN 20; Creatinine, Ser 0.71; Hemoglobin 14.7; Platelets 373; Potassium 4.2; Sodium 141; TSH 0.503  Recent  Lipid Panel    Component Value Date/Time   CHOL 203 (H) 12/04/2023 1419   CHOL 178 01/21/2019 1321   TRIG 78.0 12/04/2023 1419   HDL 56.30 12/04/2023 1419   HDL 61 01/21/2019 1321   CHOLHDL 4 12/04/2023 1419   VLDL 15.6 12/04/2023 1419   LDLCALC 131 (H) 12/04/2023 1419   LDLCALC 102 (H) 01/21/2019 1321    PHYSICAL EXAM:    VS:  There were no vitals taken for this visit.  BMI: There is no height or weight on file to calculate BMI.  Affect appropriate Chronically ill overweight female  HEENT: normal Neck supple with no adenopathy JVP normal no bruits no thyromegaly Lungs diffuse rhonchi worse in LUL Heart:  S1/S2 no murmur, no rub, gallop or click PMI normal Abdomen: benighn, BS positve, no tenderness, no AAA no bruit.  No HSM or HJR Distal pulses intact with no bruits Trace bilateral edema Neuro non-focal Skin warm and dry No muscular weakness   Wt Readings from Last 3 Encounters:  03/04/24 262 lb (118.8 kg)  01/01/24 262 lb 9.6 oz (119.1 kg)  12/04/23 262 lb 3.2 oz (118.9 kg)     ASSESSMENT & PLAN:   1. Dyspnea on exertion, chronic -related to ILD, COPD, OSA and pulmonary Hypertension Not related to cardiac issues and TTE with nonly Grade one diastolic dysfunction Prior BNP's have been normal Lasix low dose Uses oxygen 3L [place] f/u Dr Lilly Cove  2. Moderate pulmonary HTN - in the context of ILD, morbid obesity, deconditioning. F/U Dr Marchelle Gearing   3. Chronic diastolic CHF - exam largely unchanged from prior. Would continue Lasix PRN as she is doing.    4. Hyperlipidemia - patient declines further monitoring for this at this time.    Disposition: F/u with cardiology in a year    Medication Adjustments/Labs and Tests Ordered: Current medicines are  reviewed at length with the patient today.  Concerns regarding medicines are outlined above. Medication changes, Labs and Tests ordered today are summarized above and listed in the Patient Instructions accessible in  Encounters.   Signed, Charlton Haws, MD  04/01/2024 2:55 PM    [facility] Phone: [phone]; Fax: [phone]

## 2024-03-27 ENCOUNTER — Encounter (HOSPITAL_BASED_OUTPATIENT_CLINIC_OR_DEPARTMENT_OTHER): Payer: Self-pay

## 2024-03-31 ENCOUNTER — Encounter: Payer: Self-pay | Admitting: Internal Medicine

## 2024-04-01 ENCOUNTER — Encounter: Payer: Self-pay | Admitting: Cardiovascular Disease

## 2024-04-01 ENCOUNTER — Ambulatory Visit: Payer: Medicare Other | Attending: Cardiovascular Disease | Admitting: Cardiovascular Disease

## 2024-04-01 VITALS — BP 118/78 | HR 110 | Ht 63.0 in | Wt 263.0 lb

## 2024-04-01 DIAGNOSIS — E785 Hyperlipidemia, unspecified: Secondary | ICD-10-CM | POA: Diagnosis present

## 2024-04-01 DIAGNOSIS — I5032 Chronic diastolic (congestive) heart failure: Secondary | ICD-10-CM | POA: Diagnosis present

## 2024-04-01 DIAGNOSIS — R0609 Other forms of dyspnea: Secondary | ICD-10-CM | POA: Insufficient documentation

## 2024-04-01 NOTE — Patient Instructions (Signed)
 Medication Instructions:  Your physician recommends that you continue on your current medications as directed. Please refer to the Current Medication list given to you today.  *If you need a refill on your cardiac medications before your next appointment, please call your pharmacy*  Lab Work: If you have labs (blood work) drawn today and your tests are completely normal, you will receive your results only by: MyChart Message (if you have MyChart) OR A paper copy in the mail If you have any lab test that is abnormal or we need to change your treatment, we will call you to review the results.  Testing/Procedures: None ordered today.  Follow-Up: At [facility], you and your health needs are our priority.  As part of our continuing mission to provide you with exceptional heart care, our providers are all part of one team.  This team includes your primary Cardiologist (physician) and Advanced Practice Providers or APPs (Physician Assistants and Nurse Practitioners) who all work together to provide you with the care you need, when you need it.  Your next appointment:   1 year(s)  Provider:   Charlton Haws, MD     We recommend signing up for the patient portal called "MyChart".  Sign up information is provided on this After Visit Summary.  MyChart is used to connect with patients for Virtual Visits (Telemedicine).  Patients are able to view lab/test results, encounter notes, upcoming appointments, etc.  Non-urgent messages can be sent to your provider as well.   To learn more about what you can do with MyChart, go to [email or web address].   Other Instructions       1st Floor: - Lobby - Registration  - Pharmacy  - Lab - Cafe  2nd Floor: - PV Lab - Diagnostic Testing (echo, CT, nuclear med)  3rd Floor: - Vacant  4th Floor: - TCTS (cardiothoracic surgery) - AFib Clinic - Structural Heart Clinic - Vascular Surgery  - Vascular Ultrasound  5th Floor: - HeartCare  Cardiology (general and EP) - Clinical Pharmacy for coumadin, hypertension, lipid, weight-loss medications, and med management appointments    Valet parking services will be available as well.

## 2024-04-17 ENCOUNTER — Encounter: Payer: Self-pay | Admitting: Family Medicine

## 2024-04-18 ENCOUNTER — Other Ambulatory Visit: Payer: Self-pay

## 2024-04-18 DIAGNOSIS — J849 Interstitial pulmonary disease, unspecified: Secondary | ICD-10-CM

## 2024-04-18 DIAGNOSIS — D45 Polycythemia vera: Secondary | ICD-10-CM

## 2024-04-18 DIAGNOSIS — D510 Vitamin B12 deficiency anemia due to intrinsic factor deficiency: Secondary | ICD-10-CM

## 2024-04-18 DIAGNOSIS — Z7721 Contact with and (suspected) exposure to potentially hazardous body fluids: Secondary | ICD-10-CM

## 2024-04-18 DIAGNOSIS — Z209 Contact with and (suspected) exposure to unspecified communicable disease: Secondary | ICD-10-CM

## 2024-04-18 DIAGNOSIS — R0602 Shortness of breath: Secondary | ICD-10-CM

## 2024-04-19 NOTE — Telephone Encounter (Signed)
 Referral has been placed.

## 2024-04-24 ENCOUNTER — Encounter (HOSPITAL_BASED_OUTPATIENT_CLINIC_OR_DEPARTMENT_OTHER): Payer: Medicare Other

## 2024-04-24 ENCOUNTER — Ambulatory Visit (INDEPENDENT_AMBULATORY_CARE_PROVIDER_SITE_OTHER): Admitting: Podiatry

## 2024-04-24 ENCOUNTER — Ambulatory Visit: Payer: Medicare Other | Admitting: Internal Medicine

## 2024-04-24 DIAGNOSIS — Q828 Other specified congenital malformations of skin: Secondary | ICD-10-CM | POA: Diagnosis not present

## 2024-04-24 NOTE — Progress Notes (Signed)
 Subjective:  Patient ID: Patty [name], female    DOB: 1951-03-04,  MRN: [id number]  Chief Complaint  Patient presents with   Nail Problem    Nail trim     73 y.o. female presents with the above complaint.  Patient presents with left submetatarsal 5 porokeratotic lesion with underlying plantarflexed metatarsal.  Patient states pain for touch is progressive gotten worse.  She will get it evaluated she has not seen and was prior to seeing me.  She is not a diabetic.  Pain scale 7 out of 10 hurts with ambulation.   Review of Systems: Negative except as noted in the HPI. Denies N/V/F/Ch.  Past Medical History:  Diagnosis Date   Abnormal nuclear stress test    a. 07/2017: cath with no CAD   Anxiety    Aortic atherosclerosis (HCC)    Arthritis    Chronic diastolic CHF (congestive heart failure) (HCC)    Coronary artery calcification seen on CT scan    Crohn disease (HCC)    DVT (deep venous thrombosis) (HCC)    Dyspnea    Grade I diastolic dysfunction    Hyperlipidemia    ILD (interstitial lung disease) (HCC)    OSA on CPAP    CPAP    AND O2   Pernicious anemia    Polycythemia vera (HCC)    Pulmonary embolism (HCC) 2010   s/p hip surgery , reports this was never truly confirmed    Pulmonary hypertension (HCC)    Sleep disorder 01/10/2020   Uses klonopin  for sleep since 2014; sleep walking. And anxiety    Current Outpatient Medications:    albuterol  (VENTOLIN  HFA) 108 (90 Base) MCG/ACT inhaler, Inhale 2 puffs into the lungs every 6 (six) hours as needed for wheezing or shortness of breath., Disp: 8 g, Rfl: 6   amoxicillin  (AMOXIL ) 500 MG capsule, Take 4 capsules (2,000 mg total) by mouth once as needed for up to 1 dose. Before dental procedures, Disp: 20 capsule, Rfl: 1   aspirin  EC 81 MG tablet, Take 81 mg by mouth daily., Disp: , Rfl:    Calcium Carbonate-Vit D-Min (CALCIUM 1200) 1200-1000 MG-UNIT CHEW, Chew 1 tablet by mouth daily., Disp: , Rfl:    CANNABIDIOL PO, Take 1  Dose by mouth 2 (two) times daily as needed (anxiety/sleep.). CBD Oil, Disp: , Rfl:    cetirizine  (ZYRTEC ) 10 MG tablet, Take 10 mg by mouth daily as needed (sinus/allergies.)., Disp: , Rfl:    cetirizine -pseudoephedrine (ZYRTEC -D) 5-120 MG tablet, Take 1 tablet by mouth daily as needed (sinus headaches.)., Disp: , Rfl:    clonazePAM  (KLONOPIN ) 0.5 MG tablet, Take 1 tablet (0.5 mg total) by mouth daily as needed for anxiety., Disp: 90 tablet, Rfl: 1   cyanocobalamin  (VITAMIN B12) 1000 MCG/ML injection, Inject 1 mL (1,000 mcg total) into the muscle every 30 (thirty) days., Disp: 10 mL, Rfl: 1   FLUoxetine  (PROZAC ) 20 MG capsule, Take 1 capsule (20 mg total) by mouth daily., Disp: 90 capsule, Rfl: 3   fluticasone (FLONASE) 50 MCG/ACT nasal spray, Place 1-2 sprays into both nostrils daily as needed for allergies or rhinitis., Disp: , Rfl:    Fluticasone-Umeclidin-Vilant (TRELEGY ELLIPTA ) 100-62.5-25 MCG/ACT AEPB, Inhale 1 puff into the lungs daily., Disp: 14 each, Rfl: 0   furosemide  (LASIX ) 20 MG tablet, Take 1 tablet (20 mg total) by mouth as needed for fluid., Disp: 30 tablet, Rfl: 11   gabapentin  (NEURONTIN ) 100 MG capsule, Take 3 capsules (300 mg total) by  mouth at bedtime., Disp: 90 capsule, Rfl: 11   Glycerin-Polysorbate 80 (REFRESH DRY EYE THERAPY OP), Place 1 drop into both eyes daily., Disp: , Rfl:    hydrocortisone  (PROCTOZONE -HC) 2.5 % rectal cream, Place 1 application rectally 2 (two) times daily as needed (Crohn's flare)., Disp: 28 g, Rfl: 2   ibuprofen (ADVIL) 200 MG tablet, Take 400-800 mg by mouth every 8 (eight) hours as needed (pain.)., Disp: , Rfl:    mesalamine (PENTASA) 250 MG CR capsule, Take 250-500 mg by mouth 4 (four) times daily as needed (crohn's disease flare ups)., Disp: , Rfl:    Multiple Vitamins-Minerals (ICAPS AREDS 2 PO), Take 1 tablet by mouth daily., Disp: , Rfl:    potassium chloride  (MICRO-K ) 10 MEQ CR capsule, Take 1 capsule (10 mEq total) by mouth daily as needed  (when taking furosemide )., Disp: 30 capsule, Rfl: 3   prednisoLONE acetate (PRED FORTE) 1 % ophthalmic suspension, Place 1 drop into both eyes 2 (two) times daily as needed (Uveitis)., Disp: , Rfl:    Probiotic Product (PROBIOTIC PO), Take 1 capsule by mouth 2 (two) times a week. As needed, Disp: , Rfl:    SYRINGE-NEEDLE, DISP, 3 ML (LUER LOCK SAFETY SYRINGES) 25G X 1" 3 ML MISC, USE AS DIRECTED, Disp: 100 each, Rfl: 5   traMADol  (ULTRAM ) 50 MG tablet, Take 1 tablet (50 mg total) by mouth every 6 (six) hours as needed., Disp: 20 tablet, Rfl: 0   Treprostinil  (TYVASO  REFILL) 0.6 MG/ML SOLN, Inhale 54 mcg into the lungs in the morning, at noon, in the evening, and at bedtime., Disp: 87 mL, Rfl: 11   triamcinolone  cream (KENALOG ) 0.1 %, Apply 1 Application topically 2 (two) times daily., Disp: 30 g, Rfl: 0  Social History   Tobacco Use  Smoking Status Former   Current packs/day: 0.00   Average packs/day: 1 pack/day for 45.0 years (45.0 ttl pk-yrs)   Types: Cigarettes   Start date: 12/27/1963   Quit date: 12/26/2008   Years since quitting: 15.3  Smokeless Tobacco Never    Allergies  Allergen Reactions   Tizanidine Hcl Shortness Of Breath   Victoza  [Liraglutide ] Shortness Of Breath   Metformin  And Related Diarrhea    Increased moodiness   Onion Other (See Comments)    sereve diarrhea, stomach pains (Crohn's flares)   Avastin  [Bevacizumab ]    Lasix  [Furosemide ] Itching    Hypersensitivity - pt can take as needed    Pirfenidone  Other (See Comments)    GI side effects   Potassium-Containing Compounds Other (See Comments)    Due to chron's, difficult passing though kidneys not allowing absorption in the body    Prednisone Other (See Comments)    Altered mood   Sulfa Drugs Cross Reactors Other (See Comments)    "feels like bugs crawling on me"   Trelegy Ellipta  [Fluticasone-Umeclidin-Vilant] Palpitations   Objective:  There were no vitals filed for this visit. There is no height or  weight on file to calculate BMI. Constitutional Well developed. Well nourished.  Vascular Dorsalis pedis pulses palpable bilaterally. Posterior tibial pulses palpable bilaterally. Capillary refill normal to all digits.  No cyanosis or clubbing noted. Pedal hair growth normal.  Neurologic Normal speech. Oriented to person, place, and time. Epicritic sensation to light touch grossly present bilaterally.  Dermatologic Hyperkeratotic lesion with central nucleated core noted.  Pain on palpation to the lesion.  Submetatarsal 5 lesion plantarflexed metatarsal noted  Orthopedic: Normal joint ROM without pain or crepitus bilaterally. No visible  deformities. No bony tenderness.   Radiographs: None Assessment:   No diagnosis found.  Plan:  Patient was evaluated and treated and all questions answered.  Left submetatarsal 5 porokeratosis -All questions and concerns were discussed with the patient in extensive detail.  I discussed shoe gear modification offloading padding protective in extensive detail she states understanding will obtain them.  Using chisel blade handle lesion was debrided down to healthy striated tissue.  Followed by excision of central nucleated core.  Immediate relief was noted.  No complication noted.  No follow-ups on file.

## 2024-04-26 ENCOUNTER — Telehealth: Payer: Self-pay

## 2024-04-26 NOTE — Telephone Encounter (Signed)
 Copied from CRM [id number]. Topic: Clinical - Lab/Test Results >> Apr 26, 2024 10:01 AM Patty [name] wrote: Reason for CRM: [facility] called to see if there was any labs for patient from 01/2023 or 02/23 and need labs from 03/04/24, need any cardio/urine test results if taken. Fax to [phone]  Lab work from those days has been faxed to [facility]

## 2024-04-29 ENCOUNTER — Telehealth: Payer: Self-pay

## 2024-04-29 NOTE — Telephone Encounter (Signed)
 Copied from CRM [id number]. Topic: General - Other >> Apr 26, 2024  3:48 PM Ovid Blow wrote: Reason for CRM: Dr. Kopec wants Dr. Jonelle Neri to give her a call about patient CNIYA, [facility] - MRN: [id number]. Please contact Dr. Kopec @ [phone] **Will be back in Office Wed. May 7**  Message has been sent to provider to address

## 2024-04-30 ENCOUNTER — Other Ambulatory Visit (HOSPITAL_BASED_OUTPATIENT_CLINIC_OR_DEPARTMENT_OTHER): Payer: Self-pay

## 2024-04-30 ENCOUNTER — Ambulatory Visit (HOSPITAL_BASED_OUTPATIENT_CLINIC_OR_DEPARTMENT_OTHER)

## 2024-04-30 DIAGNOSIS — J849 Interstitial pulmonary disease, unspecified: Secondary | ICD-10-CM

## 2024-05-01 ENCOUNTER — Ambulatory Visit (HOSPITAL_BASED_OUTPATIENT_CLINIC_OR_DEPARTMENT_OTHER)

## 2024-05-01 DIAGNOSIS — J849 Interstitial pulmonary disease, unspecified: Secondary | ICD-10-CM

## 2024-05-01 LAB — PULMONARY FUNCTION TEST
DL/VA % pred: 63 %
DL/VA: 2.65 ml/min/mmHg/L
DLCO unc % pred: 59 %
DLCO unc: 10.99 ml/min/mmHg
FEF 25-75 Pre: 0.77 L/s
FEF2575-%Pred-Pre: 45 %
FEV1-%Pred-Pre: 67 %
FEV1-Pre: 1.41 L
FEV1FVC-%Pred-Pre: 81 %
FEV6-%Pred-Pre: 86 %
FEV6-Pre: 2.28 L
FEV6FVC-%Pred-Pre: 104 %
FVC-%Pred-Pre: 82 %
FVC-Pre: 2.29 L
Pre FEV1/FVC ratio: 61 %
Pre FEV6/FVC Ratio: 100 %

## 2024-05-01 NOTE — Patient Instructions (Signed)
 Full pft performed today.

## 2024-05-01 NOTE — Progress Notes (Signed)
 Full pft performed today.

## 2024-05-06 NOTE — Patient Instructions (Incomplete)
 Chronic hypoxemic respiratory failure  ILD (interstitial lung disease) (HCC)  WHO group 3 pulmonary arterial hypertension (HCC) with elevaed PCWP to 20 on Dec 2021 Former heavy smoker   Pulmonary fibrosis  burden appers slightly worse over time but currently 10/25/2023 no flare up - PFT not done due to schedulig glitches at our office; our apologies   Plan - continue albuterol  MDI 2 puff as needed for emergency - continue tyvaso  at curent dose --Continue oxygen  3-6 L nasal cannula pulsed with exertion and at night 2 L nasal cannula and room air at rest -Continue exercise program  -However continue to keep her pulse ox at greater than or equal to 88% using a pulse oximeter --Make attempts to lose weight - talk to your PCP Luevenia Saha, MD abut weight loss drugs -DO  spirometry and dlco in 6 months   ````````````````````````````````````````````````````````````````````` OSA on CPAP  Stable  Plan  -Continue CPAP at ngith with 2L [place] at night  per the advice of his sleep doctor   Associated emphysema on CT -feb 2023 Normal Alpha 1 AT  stable  Plan  -prescribe albluterol 2 puff as needed - continue trelegy schedule   Sinus Tachycardia  -This happens with exertion and is likely related to weight, physical deconditioning and also hypoxemia -Age-predicted minute maximal heart rate is around 120/min  Plan -As long as you are feeling asymptomatic okay to let your heart rate go to 130-135/min -There is a new medicine called IVARBRADINE that might help this    - talk to cardiology  Follow-up  -6  months s face to face with Dr Bertrum Brodie; 15 min visit  = symptom score and walk test at followupo   -

## 2024-05-06 NOTE — Progress Notes (Unsigned)
 @Patient  ID: Patty [name], female    DOB: 05-11-1951, 73 y.o.   MRN: [id number]  Chief Complaint  Patient presents with   Follow-up    dyspnea     Referring provider: No ref. provider found  HPI: 73 year old female former smoker seen for pulmonary consulokay guarded because then maybe they can meet him right now for the registryt 05/09/2017 for progressive dyspnea since 2004.Patty [name] Patient has polycythemia vera followed by hematology and [place] She has Crohn's disease    IOV  05/09/2017  Chief Complaint  Patient presents with   Pulmonary Consult    Pt referred for SOB with activity x years. Pt states over the last year the DOE has worsened. Pt denies cough and CP/tightness and f/c/s.     73 year old obese female. In 2004 Started noticing insidious onset of shortness of breath. In 2005 more [place] from [place] and probably gain over 60 pounds of weight. She has extensive workup at that time according to her history. She was then diagnosed with polycythemia by Dr. Jamie Mccoy at [facility]. She does not recollect any phlebotomies for this. However in the last 4-5 years she's had worsening shortness of breath. Noticeable with exertion particularly in the gym and doing standing exercises but not so much with sitting exercises. Relieved by rest. When she does some yard work she notices some associated wheezing as well.  Walking desaturation test in the office she did desaturate to 88% and did get tachycardic. She says that a chest x-ray done by primary care physicians interstitial pulmonary fibrosis. I personally visualized the chest x-ray done 04/03/2017: There is some interstitial markings but it is obscured by her obesity. I'm not so certain that is definite ILD. There is no lab work in the record. There is no PFT a CT scan available for visualization     05/29/2017 Follow up : Dyspnea  Patient returns for a two-week follow-up. Patient was seen for pulmonary consult 05/09/2017  for progressive dyspnea since 2004. Patient was set up for a high resolution CT chest that showed a very mild basilar predominant subpleural reticulation and bronchiectasis which could be due to nonspecific interstitial pneumonitis.. CT did have incidental findings of enlarged pulmonary arteries and aortic and coronary calcification. Patient had pulmonary function test on May 23 that showed an FEV1 at 77%, ratio 73, FVC 81, no significant bronchodilator response, total lung capacity 100%, DLCO 52%. Overnight oximetry test did show significant desaturations. We discussed beginning oxygen  at bedtime.  Patient denies significant cough. Says that she get short of breath with walking. Has daytime fatigue and low energy..  Interstitial lung disease patient questionnaire was completed as follows Patient has no previous use of Macrodantin, amiodarone, methotrexate. She has been treated with prednisone for her Crohn's in the past. Patient has had extensive travel with brief visits to multiple state and country's.. [place]. Did live in a apartment that she did notice mold for 4 years. She does have Crohn's disease and was on Mesamaline for many years. Patient says she has had a humidifier and hot tub and previous house. She has no indoor birds. She does have a dog. Patient says she was told that she had a blood clot in the past but was never found.   OV 08/15/2017  Chief Complaint  Patient presents with   Follow-up    Pt still gets occ. SOB. Other than that, pt states that she has been doing good. Denies  any cough or CP.     Follow-up multifactorial dyspnea with interstitial lung disease concern.   Regarding interstitial lung disease concern: Mid May 2018 she had high resolution CT chest that showed interstitial lung disease very mild but unclear if possible UIP pattern. Autoimmune test was negative. Isolated reduction in diffusion capacity 50%. [employer] of chest physicians  questionnaire shows previous Crohn's disease and also mold exposure previously. Overall she feels stable  Regarding dyspnea: She underwent echocardiogram in June 2018 showed grade 1 diastolic dysfunction. Had abnormal cardiac stress test July 2018 followed by cardiac cath early August 2018 that showed normal coronary angiogram but elevated left ventricular end-diastolic pressure. Dietary therapy has been recommended but she is unaware of these results.  Overall she is here to discuss these results.  IMPRESSION: 1. Suspect very mild basilar predominant subpleural reticulation and bronchiolectasis, which may be due to nonspecific interstitial pneumonitis. 2. Aortic atherosclerosis (ICD10-170.0). Coronary artery calcification. 3. Enlarged pulmonary arteries, indicative of pulmonary arterial hypertension.     Electronically Signed   By: Shearon Denis M.D.   On: 05/13/2017 07:33    OV 10/17/2017  Chief Complaint  Patient presents with   Follow-up    Pt states that she has had good days and bad days since last visit. States that when she was in [place]  for a month, breathing was better; still became SOB but not as bad as in . Pt's SOB is mainly on exertion. Denies any cough or CP.    Follow-up dyspnea that is multifactorial due to obesity, physical deconditioning and diastolic dysfunction Follow-up mild interstitial lung disease not otherwise specified  Last visit I started her on Lasix . I was only supposed to see herin 6 months or so. However she's had problems tolerating Lasix  and potassium. It fluctuates between palpitations and hypertension. She feels the palpitations might be related to potassium depletion after taking Lasix . But she also tells me that the Lasix  does help her dyspnea. At this point in time her condition is to see cardiologist. Off note she did send the email message asking these questions on 09/28/2017 made a reply that she tells me that she never got the  reply.   OV 02/13/2018  Chief Complaint  Patient presents with   Follow-up    PFT done 02/05/18.  Pt states she has been doing good. Was sick in january but states she is doing better.  DME: [facility] 4L pulse    Follow-up dyspnea that is multifactorial due to obesity, physical deconditioning and diastolic dysfunction Follow-up mild interstitial lung disease not otherwise specified v concern   73 year old female with concern for interstitial lung disease.  Since her last visit she continues to do well.  She is attending pulmonary rehabilitation.  She feels better and less short of breath.  She uses oxygen  with exertion at rehab saying she needs it.  She has upcoming travel in summer to [place]  and wanted some flight information oxygen  form filled out.  She is wondering if mesalamine that she took several years ago was the cause of interstitial lung disease or her Crohn's disease.  But overall she is stable.  Pulmonary function test shows stability and FVC since last 1 year with some improvement in DLCO.  High-resolution CT scan of the chest interpreted by thoracic radiology report suspected ILD.   OV 08/28/2018  Chief Complaint  Patient presents with   Follow-up    PFT performed today.  Pt states she has had both good and bad days  since last visit. Pt denies any complaints of cough, SOB, or CP but states she is having some problems with congestion since she has been back from vacation. Pt has been having problems with getting O2 supplies taken care of with DME.    Follow-up dyspnea that is multifactorial due to obesity, physical deconditioning and diastolic dysfunction Follow-up concern for  mild interstitial lung disease not otherwise specified  Patty [name] - Presents for follow-up for the above issues. Her dyspnea significantly better following a 30 pound weight loss and continued rehabilitation. She uses oxygen  with rehabilitation. Today walking desaturation test 185 feet 3 laps on room  air: She dropped to 87% on the second lap. But she is feeling better. She had spirometry today that shows improvement in FVC concomitant with weight loss but no change in diffusion capacity that reflects the presence of ongoing possible ILD.She continues to lose more weight she believes that she could lose another 30 pounds before the next visit.     OV 03/12/2019  Subjective:  Patient ID: Patty [name], female , DOB: 1951-09-27 , age 89 y.o. , MRN: [id number] , ADDRESS: [address] [place] [address]   03/12/2019 -   Chief Complaint  Patient presents with   Follow-up    PFT performed today.  Pt states she has been doing good since last visit. States she still becomes SOB with exertion, and has phlegm in the mornings, postnasal drainage which has used flonase to help. Pt also uses POC as needed.    Follow-up dyspnea that is multifactorial due to obesity, physical deconditioning and diastolic dysfunction Follow-up  mild interstitial lung disease not otherwise specifieds -with stability 2018 through 2020 [non-IPF pattern]; on observation   HPI Patty [name] 73 y.o. -presents for follow-up.  She is attending pulmonary rehabilitation and using oxygen  with exertion.  Overall she feels stable.  Subjective symptom parameters and objective parameters are documented below.  She had high-resolution CT chest that showed shows that she has ILD.  At this point we can be confident that she has mild ILD but it is stable based on symptoms walking test and pulmonary function test.  The previous autoimmune test was negative.      OV 10/07/2019  Subjective:  Patient ID: Patty [name], female , DOB: [birth year] , age 78 y.o. , MRN: [id number] , ADDRESS: [address] [place] [address]   10/07/2019 -   Chief Complaint  Patient presents with   Follow-up     Follow-up dyspnea that is multifactorial due to obesity, physical deconditioning and diastolic dysfunction Follow-up  mild  interstitial lung disease not otherwise specifieds -with stability 2018 through 2020 [non-IPF pattern]; on observation Follow-up mild pulmonary emphysema present on high-resolution CT chest  HPI Patty [name] 73 y.o. -presents for follow-up.  Prior visit was pre-pandemic.  Since then she has been sedentary at home.  She tells me that she is more short of breath.  The symptom scores below reflect that.  She thinks is because of the 30 pound weight gain because of sedentary living and social isolation following the onset of the pandemic.  She is frustrated by this.  Deep down she thinks her ILD is stable.  Pulmonary function test shows a drop in FVC but stability and DLCO suggesting weight gain is an ongoing issue for worsening dyspnea and pulmonary function test.  She is willing to have a high-dose flu shot today.  She also tells me that she is not taking her Spiriva   because it was expensive.  Although it did help her she is willing to try again and try to price it.  She also stated that her mom who never smoked had emphysema but her dad smoked heavily.  She has never been tested for alpha-1.   [place] destats -dropped. But tries not to use it in public    IMPRESSION: 1. Very mild scattered basilar subpleural reticulation and ground-glass similar to 01/29/2018. Nonspecific interstitial pneumonitis can not be excluded. Findings are indeterminate for UIP per consensus guidelines: Diagnosis of Idiopathic Pulmonary Fibrosis: An Official ATS/ERS/JRS/ALAT Clinical Practice Guideline. Am Patty Barton Crit Care Med Vol 198, Iss 5, [id number], Aug 26 2017. 2. Question cirrhosis. 3. Aortic atherosclerosis (ICD10-170.0). Coronary artery calcification. 4. Enlarged arteries, arterial hypertension. 5.  Emphysema (ICD10-J43.9).     Electronically Signed   By: Shearon Denis M.D.   On: 03/06/2019 16:59   ROS - per HPI     OV 06/23/2020  Subjective:  Patient ID: Patty [name], female ,  DOB: 21-Oct-1951 , age 54 y.o. , MRN: [id number] , ADDRESS: [address] [place] [address]   06/23/2020 -   Chief Complaint  Patient presents with   Follow-up    shortness of breath with exertion   Follow-up dyspnea that is multifactorial due to obesity, physical deconditioning and diastolic dysfunction  Follow-up  mild interstitial lung disease not otherwise specifieds -with stability 2018 through 2020 [non-IPF pattern/indeterminate   -; on observation  - last CT Mach 2020 0> July 2021 wthout progression  Follow-up mild pulmonary emphysema present on high-resolution CT chest - alpha 1 MM  - o2 with exertion  - advised spiriva  oct 2020 but too expensive  HPI Patty [name] 74 y.o. -returns for routine follow-up.  She says she has been doing well.  She uses portable oxygen  cranks it up to 4 L - 5 L and then walks every day for 30 minutes.  Overall she feels stable.  Her symptom score itself is stable.  However on pulmonary function testing her FVC shows a decline.  This decline started in October 2020 and since then it is stable.  She attributes this to weight gain.  There is what she told me last time as well.  However when we walked her she seemed to desaturate much more easily.  We also needed to corrected desaturation at 5 L.  She thinks it is because the portable oxygen  system is not delivering oxygen  currently and is a technical issue with a portable machine.  However the distance of desaturation seems to worsen.  There is no leg swelling although she has some mild varicose veins at baseline.  She is not on nighttime oxygen  and she recollects attest that this some years ago and was normal back then.  Currently she is only using portable oxygen .  Her last echocardiogram was few years ago.  There is no hemoptysis or worsening cough.  She got a Covid vaccine in February 2021 and she thinks things might of changed since then.   After she left I was able to review the home  palliative care note from 01/11/2022:.  She expressed her life wishes that she does not want to be put on the ventilator.  Does not want a feeding tube.  She likes to pass away peacefully like her mother a DNR and MOST form were given.  They will address this in the future with her.  Results for DARLIN, DRESSEL ANN (MRN [id number]) as of 06/23/2020  11:49  Ref. Range 05/17/2017 13:11 02/05/2018 12:14 08/28/2018 11:22 03/12/2019 14:47 10/01/2019 15:58 30 pound weight gain 06/09/2020 11:25  FVC-Pre Latest Units: L 2.61 2.57 2.80 2.73 2.38 2.42  FVC-%Pred-Pre Latest Units: % 84 83 94 91 81 82  FEV1-Pre Latest Units: L 1.83 1.71 1.96 1.83 1.65 1.53   Results for RANI, FAILLE ANN (MRN [id number]) as of 06/23/2020 11:49  Ref. Range 05/17/2017 13:11 02/05/2018 12:14 08/28/2018 11:22 03/12/2019 14:47 10/01/2019 15:58 06/09/2020 11:25  DLCO unc Latest Units: ml/min/mmHg 12.64 15.15 14.53 13.77 14.39 13.48  DLCO unc % pred Latest Units: % 52 62 62 72 75 70    ROS - per HPI     OV 02/23/2021  Subjective:  Patient ID: Patty [name], female , DOB: [birth year] , age 40 y.o. , MRN: [id number] , ADDRESS: [address] [place] [address] PCP Luevenia Saha, MD Patient Care Team: Luevenia Saha, MD as PCP - General (Family Medicine) Pyrtle, Amber Bail, MD as Consulting Physician (Gastroenterology) Shamleffer, Julian Obey, MD as Consulting Physician (Endocrinology) Maire Scot, MD as Consulting Physician (Pulmonary Disease)  This Provider for this visit: Treatment Team:  Attending Provider: Maire Scot, MD   Follow-up dyspnea that is multifactorial due to obesity, physical deconditioning and diastolic dysfunction, ILD and new dx Group 3 PAH - dec 2021  45 pppd prior smoking hix  Follow-up  mild interstitial lung disease not otherwise specifieds -with stability 2018 through 2020 [non-IPF pattern/indeterminate   -; on observation  - last CT Mach 2020 - > July 2021 without  progression  Inconsisent dxx of  pulmonary emphysema present on high-resolution CT chest - alpha 1 MM  - o2 with exertion  - advised spiriva  oct 2020 but too expensive  Reported in only 1 CT. No mention in July 2021 CT  WHO group 3 Pulm htn with elevated PCWD - [facility] 12/07/20:   RA 13/12 mean 9 mmHg RV 48/8, EDP 12 PA 50/29 mean 38 PCWP 22/20 mean 20   O2 saturations: PA 62, Ao 97, SVC 64   CO 4.26 L/m, CI 1.96 Trans-pulmonic gradient 18 mmHg PVR 4.2 Woods Units  Grade 1 Disast dysfhn  - reportred July 2021 echo  Obesity  02/23/2021 -   Chief Complaint  Patient presents with   Follow-up    Doing ok     HPI Patty [name] 73 y.o. -returns for 19-month follow-up.  She has inconsistent diagnosis of emphysema.  Was reported in 1 scan but not another scans.  She has ILD.  She is foregoing biopsy.  She tells me overall that she is stable.  Although there are some weight gain.  She feels in the interim Metformin  messed up her GI system and cause weight gain but she is glad to be out of it.  She had 6-minute walk test on 02/15/2021 at pulmonary rehabilitation.  She walked 7 and 15 feet in 6 minutes.  She did not break at all.  Her pulse ox dropped below 88% 2 times.  She corrected with 4 L of oxygen .  Her resting oxygen  saturation was 96%.  And lowest was 86%.  She also had right heart catheterization in December 2021.  She has elevated pulmonary pressures but also elevated wedge.  Cardiology is following this up and she is on diuresis.  We spent some time discussing her care option.  She had pulmonary function test that shows stability in the last year and a half but declined in 2 years time.  Her high-resolution CT scan of the chest done in summer 2021 shows stability over 4 years.  She is not interested in antifibrotic's because of her colitis and also given her stability.  She does not want to go through pulmonary lung biopsy given the risks  She generally tries to avoid  medications.  She is happy going to pulmonary rehabilitation.  We discussed inhaled treprostinil  as approved therapy for pulmonary hypertension -expressed to her the overall safety profile for over 20 years and approved recently for pulmonary hypertension and WHO group 3.  Explained the minimal side effect risk but she does not want to go through this  We discussed the option of participating in a clinical trial.  We discussed what is called the PULSE inhaled nitric oxide  device study.  This is a 28-week plus study.  It was a device and a nitric oxide  supply study.  She is actually interested in this.  She is participating clinical trials.  She understands the voluntary nature of this.  She understands the risks that come with clinical trials.  She understands the control of risk through close follow-up and interventions..  However her 6-minute walk test is quite adequate and also she starting pulmonary rehabilitation so she would not be able to participate in this trial for several months.  In addition she is aware that inhaled treprostinil  which is standard of care therapy is currently on exclusion in this protocol current amendment.  She understands if she qualifies and chooses this trial that this would be a limitation.  We have given her the consent for review but at this time she does not qualify for this trial.  HRCT July 2021   IMPRESSION: 1. Spectrum of findings suggestive of a mild basilar predominant fibrotic interstitial lung disease without appreciable interval progression since baseline 05/12/2017 high-resolution chest CT. Favor NSIP, with UIP not excluded. Findings are indeterminate for UIP per consensus guidelines: Diagnosis of Idiopathic Pulmonary Fibrosis: An Official ATS/ERS/JRS/ALAT Clinical Practice Guideline. Am Patty Barton Crit Care Med Vol 198, Iss 5, [phone], Aug 26 2017. 2. Dilated main pulmonary artery, stable, suggesting chronic pulmonary arterial hypertension. 3. Aortic  Atherosclerosis (ICD10-I70.0).     Electronically Signed   By: Levell Reach M.D.   On: 07/10/2020 15:50     OV 05/31/2021  Subjective:  Patient ID: Patty [name], female , DOB: 1951/09/01 , age 44 y.o. , MRN: [id number] , ADDRESS: [address] [place] [address] PCP Luevenia Saha, MD Patient Care Team: Luevenia Saha, MD as PCP - General (Family Medicine) Pyrtle, Amber Bail, MD as Consulting Physician (Gastroenterology) Shamleffer, Julian Obey, MD as Consulting Physician (Endocrinology) Maire Scot, MD as Consulting Physician (Pulmonary Disease)  This Provider for this visit: Treatment Team:  Attending Provider: Maire Scot, MD    Follow-up dyspnea that is multifactorial due to obesity, physical deconditioning and diastolic dysfunction, ILD and new dx Group 3 PAH - dec 2021  45 pppd prior smoking hix  Follow-up  mild interstitial lung disease not otherwise specifieds -with stability 2018 through 2020 [non-IPF pattern/indeterminate   -on observation  -last CT Mach 2020 - > July 2021 without progression  Inconsisent dxx of  pulmonary emphysema present on high-resolution CT chest - alpha 1 MM  - o2 with exertion  - advised spiriva  oct 2020 but too expensive  Reported in only 1 CT. No mention in July 2021 CT  WHO group 3 Pulm htn with elevated PCWD - [facility] 12/07/20:   RA 13/12  mean 9 mmHg RV 48/8, EDP 12 PA 50/29 mean 38 PCWP 22/20 mean 20   O2 saturations: PA 62, Ao 97, SVC 64   CO 4.26 L/m, CI 1.96 Trans-pulmonic gradient 18 mmHg PVR 4.2 Woods Units  Grade 1 Disast dysfhn  - reportred July 2021 echo  Obesity   05/31/2021 -   Chief Complaint  Patient presents with   Follow-up    Pt states she has been doing okay since last visit. States she has begun pulmonary rehab which has helped.     HPI Patty [name] 73 y.o. -returns for follow-up.  She continues on supportive care with oxygen .  Room air at rest 3-5 L pulsed with  oxygen .  For the last few months she is on pulsed oxygen .  She uses 2 L nasal cannula at night with her CPAP.  She is completed pulmonary rehabilitation.  For the last few months she is using pulsed oxygen .  She feels this works better.  She is attending [employer] [facility].  She does elliptical or treadmill.  She feels her symptoms have improved.  She is continue to gain some weight though.  She again reported that she feels sensitive to medication and wants avoid antifibrotic's.  She was interested this time and discussing treprostinil  versus pulsed research protocol for patients with interstitial lung disease and hypoxemic respiratory failure.  She has pulmonary hypertension based on 2021 December right heart catheterization.  We discussed the long-term safety profile of inhaled treprostinil .  Discussed the side effect profile.  After reflection about the fact the increase study showed improvement in 6-minute walk test, subgroup analysis potential modification of ILD and other composite outcome improvement.     IMPRESSION: 1. Spectrum of findings suggestive of a mild basilar predominant fibrotic interstitial lung disease without appreciable interval progression since baseline 05/12/2017 high-resolution chest CT. Favor NSIP, with UIP not excluded. Findings are indeterminate for UIP per consensus guidelines: Diagnosis of Idiopathic Pulmonary Fibrosis: An Official ATS/ERS/JRS/ALAT Clinical Practice Guideline. Am Patty Barton Crit Care Med Vol 198, Iss 5, [phone], Aug 26 2017. 2. Dilated main pulmonary artery, stable, suggesting chronic pulmonary arterial hypertension. 3. Aortic Atherosclerosis (ICD10-I70.0).     Electronically Signed   By: Levell Reach M.D.   On: 07/10/2020 15:50   OV 09/07/2021  Subjective:  Patient ID: Patty [name], female , DOB: Dec 26, 1951 , age 69 y.o. , MRN: [id number] , ADDRESS: [address] [place] [address] PCP Luevenia Saha, MD Patient Care  Team: Luevenia Saha, MD as PCP - General (Family Medicine) Bridgett Camps, Amber Bail, MD as Consulting Physician (Gastroenterology) [facility], Julian Obey, MD as Consulting Physician (Endocrinology) Maire Scot, MD as Consulting Physician (Pulmonary Disease)  This Provider for this visit: Treatment Team:  Attending Provider: Maire Scot, MD  Chronic hypoxemic respiratory failure due to all of the below ILD (interstitial lung disease) [facility])  -  Decline PF March 2020 through February 2022  but stable October 2020 through February 2022 on pulmonary function test -Stable on CT scan of the chest 2018 through July 2021 -Non-IPF likely - such as NSIP -Overall supportive care of plan without antifibrotic's given history of colitis [antifibrotic's can exacerbate this situation] and prior stability -You are using room air at rest with 3-5 L with exertion -pulse oxygen  for the last few months    WHO group 3 pulmonary arterial hypertension (HCC) with elevaed PCWP to 20 on Dec 2021 - started tyvaso  July 2022  WHO group 3 pulmonary arterial  hypertension (HCC) with elevaed PCWP to 20 on Dec 2021 - -Continue CPAP at ngith with 2L [place] at night  09/07/2021 -   Chief Complaint  Patient presents with   Follow-up    PFT performed today.  States that she still becomes SOB with exertion. Pt is now on Tyvaso  and states she finds herself needing her O2 more.     HPI Patty [name] 73 y.o. -returns for follow-up.  She started treprostinil  in July 2022.  She says initially started doing better.  But once she got to 7 puffs 4 times daily she started getting more short of breath.  She felt overall that she is able to take a deep breath and feel less fatigued but when she got to that dose she said 1 time she walked across the room without oxygen  which she normally walks without oxygen  and she felt dizzy and winded.  Since then the symptoms have persisted.  She is also gained significant amount of  weight.  She is gained 16 pounds.  Her pulmonary function test is worse with a decline in both FVC and DLCO.  She says she is now needing 6 L of nasal cannula oxygen  when she exercises.  Previously 5 L nasal cannula.  Symptom score wise and walking desaturation test wise she seems the same.  She does not want to do antifibrotic's because of prior history of colectomy.  At this point she is not sure if the treprostinil  is helping her or hurting her or not doing anything.  OV 10/19/2021  Subjective:  Patient ID: Patty [name], female , DOB: 1951-08-02 , age 58 y.o. , MRN: [id number] , ADDRESS: [address] 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 - diagnosed Sept 2022 -Non-IPF likely - such as NSIP -  Decline PF March 2020 through February 2022  but stable October 2020 through February 2022  -> declined sept 2022 PFT -Stable on CT scan of the chest 2018 through July 2021 -You are using room air at rest with 3-5 L with exertion -pulse oxygen  for the last few months - started esbriet      WHO group 3 pulmonary arterial hypertension (HCC) with elevaed PCWP to 20 on Dec 2021 - started tyvaso  July 2022 -> stopped sept 2022 of concern PFT declined could ave been due to tyvaso  -> but restarted later in sept 2022   WHO group 3 pulmonary arterial hypertension (HCC) with elevaed PCWP to 20 on Dec 2021 - -Continue CPAP at ngith with 2L [place] at night   10/19/2021 -  followup  ILD   Type of visit: Telephone/Video Circumstance: COVID-19 national emergency Identification of patient Patty [name] with Apr 08, 1951 and MRN [id number] - 2 person identifier Risks: Risks, benefits, limitations of telephone visit explained. Patient understood and verbalized agreement to proceed Anyone else on call: just patient Patient location: [phone] - her cell.  At her home This provider location: [address] [place]   HPI Saarah Hardy 73 y.o. - esbriet  still pending. BAck on tyvaso  since mid-late sept  2022. Now doing it 4x/day - 5x/each time.   Also reports sewage leak since April/May 2022 Occassional odor. Some leakage through cleanup pipe. FEels this has made her respiration worse instead of tyvaso . Initialy she thought was tyvaso  but later realized might have been due to sweage leak and finally fixed in sept 2022.She feels after long term stability things got worse because of sweage leak. She feels since leak fixed (Same time went back on tyvaso ) she is better. In gym mets were down to 2.1 but now upto 2.4 mets (baseline 2.7 mets). NExt visit with mid dec 2022.   She wants to know if progression is perrmanent  Esbriet  sill in process. Arriving in 2 days. She wil start at 1 pill tid x 1 week -> 2 pill tid and maintain  She can do visit 11/04/21 - 3.00pm - with [name] - video to assess followup on esbriet     OV 11/11/21 - video APP  73 yo female former smoker followed for ILD and Emphysema  Medical history significant for Crohn's Disease and DCHF   Today's video visit is a 1 month follow up for ILD . She has recently started on Esbriet  2 weeks ago, does notice some symptoms of fatigue , joint aches and fatigue. Started on Esbriet  267 mg 2 tab Twice daily, this week. No v/d. Some nausea . No bloody stools. Some intermittent pain under right ribs on/off. No urinary issues. No syncope.  Remains on Tyvaso  Four times a day   Remains on Oxygen  6l/m with activity and  2l/m At bedtime  . None at rest  No change in O2 requirements   OV 12/07/2021  Subjective:  Patient ID: Patty [name], female , DOB: 02-04-1951 , age 89 y.o. , MRN: [id number] , ADDRESS: [address] [place] [address] PCP Luevenia Saha, MD Patient Care Team: Luevenia Saha, MD as PCP - General (Family Medicine) Loyde Rule, MD as PCP - Cardiology (Cardiology) Pyrtle, Amber Bail, MD as Consulting Physician (Gastroenterology) [facility], Julian Obey, MD as Consulting Physician (Endocrinology) Maire Scot, MD as Consulting Physician (Pulmonary Disease)  This Provider for this visit: Treatment Team:  Attending Provider: Maire Scot, MD    12/07/2021 -   Chief Complaint  Patient presents with   Follow-up    PFT performed 12/12. Pt states she began Esbriet  November 2022 and said that she has had different side effects from the med.    HPI Patty [name] 73 y.o. -at last visit we started pirfenidone  but as soon as she went up to 2 pills 3 times daily she started having significant side effects including drop in urine output therefore she stopped.  Most of the side effects are GI.  Present even at 1 pill 3 times daily.  She says stopped pirfenidone  yesterday and ever since then beginning to feel better especially today.  Continues on oxygen  at the same level.  Is on inhaled treprostinil  nebulizer.  Currently increased it to 6 times at 4 times a day.  Slowly going to work her self up.  She held off on escalating this while she was on pirfenidone  start.  We looked at her file-per registry but need clarification from radiology about whether she has traction bronchiectasis.  In addition need to know the current status of her polycythemia.  Last CT scan of the chest was in 2021.  We discussed about the fact that her ILD is progressing.  She is lives alone.  No siblings in  the city parents are deceased.  No children.  She has neighbors.  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  She says she regularly updates him.  We did not discuss CODE STATUS.  Offered home palliative care and she is willing.  She knows that she needs to get medical alert.       OV 02/01/2022  Subjective:  Patient ID: Patty [name], female , DOB: Jul 05, 1951 , age 43 y.o. , MRN: [id number] , ADDRESS: [address] [place] [address] PCP Luevenia Saha, MD Patient Care Team: Luevenia Saha, MD as PCP - General (Family Medicine) Loyde Rule, MD as PCP - Cardiology (Cardiology) Pyrtle, Amber Bail, MD as Consulting Physician (Gastroenterology) [facility], Julian Obey, MD as Consulting Physician (Endocrinology) Maire Scot, MD as Consulting Physician (Pulmonary Disease)  This Provider for this visit: Treatment Team:  Attending Provider: Maire Scot, MD    02/01/2022 -   Chief Complaint  Patient presents with   Follow-up    Pt states she is about the same since last visit.    HPI Patty [name] 73 y.o. -returns for follow-up.  She tells me the inhaler presently is working really well for her.  She feels that given the Crohn's disease is better.  Her exercise tolerance on the treadmill is better.  Is increased to 2.5 METS.  Her shortness of breath score is also improved.  There is normal sewage exposure in the house.  She feels her lungs are improving.  She had a high-resolution CT scan of the chest.  Dr. Kareen Osier the radiologist says there is ILA and not ILD.  She [as well as myself] no perplexed by this.  We looked at the CT scans ourselves.  In my view this may be early ILD.  It might be slightly worse in 2018.  Looking at lung function test status greater than 15% total decline in 3 years between 2019 and 2022.  However we do agree that the interstitial findings on the CT scan are mild.  Also she does not feel that she has significant emphysema.  She feels the CT report is over waiting the emphysema.  She says even  though she smoked it was not a true pack per day.  She says the Spiriva  did not help her.  Therefore she is a little bit perplexed with the emphysema diagnosis on the CT scan.  She and I agreed that we will discuss in a case conference.  I did submit her name for this.  At this point in time she is not on antifibrotic's and prefers not to be on it.  She is content being on treprostinil .     CT Chest data  CT Chest High Resolution  Result Date: 01/31/2022 CLINICAL DATA:  Interstitial lung disease, former smoker. EXAM: CT CHEST WITHOUT CONTRAST TECHNIQUE: Multidetector CT imaging of the chest was performed following the standard protocol without intravenous contrast. High resolution imaging of the lungs, as well as inspiratory and expiratory imaging, was performed. RADIATION DOSE REDUCTION: This exam was performed according to the departmental dose-optimization program which includes automated exposure control, adjustment of the mA and/or kV according to patient size and/or use of iterative reconstruction technique. COMPARISON:  07/10/2020. FINDINGS: Cardiovascular: Atherosclerotic calcification of the aorta, aortic valve and coronary arteries. Pulmonic trunk is enlarged. Heart size within normal limits. No pericardial effusion. Mediastinum/Nodes: No pathologically enlarged mediastinal or axillary lymph nodes. Hilar regions are difficult to definitively  evaluate without IV contrast but appear grossly unremarkable. Esophagus is grossly unremarkable. Lungs/Pleura: Centrilobular emphysema. Negative for subpleural reticulation, traction bronchiectasis/bronchiolectasis, ground-glass, architectural distortion or honeycombing. Subpleural densities and ground-glass seen on supine imaging largely clear on prone imaging. Mild bibasilar scarring and bronchiectasis. No pleural fluid. Airway is unremarkable. No air trapping. Upper Abdomen: Liver margin is slightly irregular. Visualized portions of the liver, gallbladder,  adrenal glands and right kidney are otherwise unremarkable. 1.4 cm exophytic low-attenuation lesion off the interpolar left kidney is likely a cyst. Visualized portions of the spleen, pancreas, stomach and bowel are grossly. Scattered surgical clips in the abdomen. Upper abdominal lymph nodes are not enlarged by CT size criteria. Musculoskeletal: Degenerative changes in the spine. No worrisome lytic or sclerotic lesions. IMPRESSION: 1. Favor absence of interstitial lung disease based on prone imaging. 2. Bibasilar scarring and mild bronchiectasis. 3. Marginal irregularity the liver is indicative of cirrhosis. 4. Aortic atherosclerosis (ICD10-I70.0). Coronary artery calcification. 5.  Emphysema (ICD10-J43.9). 6. Enlarged pulmonic trunk indicative pulmonary arterial hypertension. Electronically Signed   By: Shearon Denis M.D.   On: 01/31/2022 11:53         OV 08/19/2022  Subjective:  Patient ID: Patty [name], female , DOB: 1951-03-23 , age 82 y.o. , MRN: [id number] , ADDRESS: [address] [place] [address] PCP Luevenia Saha, MD Patient Care Team: Luevenia Saha, MD as PCP - General (Family Medicine) Loyde Rule, MD as PCP - Cardiology (Cardiology) Pyrtle, Amber Bail, MD as Consulting Physician (Gastroenterology) [facility], Julian Obey, MD as Consulting Physician (Endocrinology) Maire Scot, MD as Consulting Physician (Pulmonary Disease)  This Provider for this visit: Treatment Team:  Attending Provider: Maire Scot, MD  Type of visit: Video Circumstance: patietn preferene Identification of patient Patty [name] with [birth year] and MRN [id number] - 2 person identifier Risks: Risks, benefits, limitations of telephone visit explained. Patient understood and verbalized agreement to proceed Anyone else on call: jsut patient Patient location: her home This provider location: [address], [address]; [place]; [place] [address]. [facility]. [phone]   08/19/2022 -  Followup above via video    HPI Patty [name] 73 y.o. -overall stable but maybe some more tired than usual. Goes to gym and works out treadmill (!5 min), elliptical 2-3h/week. Walks mostly in stores where she does better. Trying to lose weight- have not lose weight . She has not tried medications for weight loss yet but worried in the settling of Crohns she is worried about side effect. STill on Tyvaso  - 9 puff 4 times per day. Did  echo yesterday - gr1 ddx and very small pericardia effusion  per rreport. Had repeat CT April [id number] -> they are saying no ILD again. But she is on tyvaso  and helping her dyspnea. She feels tyvaso  since 2022 and church controlling sewage overflows in sept 2022 and crohns being in remission has helped   RE sewage exposure  -is the nearby church behind her house and there ws intermittent exposure. She feels sewage exposure made her worse. She has an attorney  Using o2 6L [place] with exertion. At rest  Today room air at rest - 96% and HR 77%   CT chest HRCT- Apirl 2023  IMPRESSION: 1. Mild, bland appearing bandlike scarring of the bilateral lung bases, unchanged compared to recent prior examination. No evidence of fibrotic interstitial lung disease. 2. Coronary artery disease. 3. Enlargement of the main pulmonary artery, as can be seen in  pulmonary hypertension. 4. Coarse, nodular contour of the partially imaged liver in the upper abdomen, suggesting cirrhosis.   Aortic Atherosclerosis (ICD10-I70.0).     Electronically Signed   By: Fredricka Jenny M.D.   On: 03/31/2022 10:16     OV 09/22/2022  Subjective:  Patient ID: Patty [name], female , DOB: [birth year] , age 67 y.o. , MRN: [id number] , ADDRESS: [address] [place] [address] PCP Luevenia Saha, MD Patient Care Team: Luevenia Saha, MD as PCP - General (Family Medicine) Loyde Rule, MD as PCP - Cardiology (Cardiology) Pyrtle, Amber Bail,  MD as Consulting Physician (Gastroenterology) [facility], Julian Obey, MD as Consulting Physician (Endocrinology) Maire Scot, MD as Consulting Physician (Pulmonary Disease)  This Provider for this visit: Treatment Team:  Attending Provider: Maire Scot, MD   09/22/2022 -   Chief Complaint  Patient presents with   Follow-up    PFT performed today.  Pt states she has had good and bad days with her breathing especially when the weather changes it becomes affected.     HPI Patty [name] 73 y.o. -returns for follow-up.  Overall she feels she is doing stable but her lung function shows continued decline.  She says since last year there is no further sewage exposure in the church behind her house.  She says the way they repaired it she is worried that there may be future risk of exposure.  It is great life charge on [employer].  The pastor that is [place].  She is upset about the situation.  She is worried that the significant sewage exposure last year has continued to contribute to her lung damage.  There is a concept called hit-and-run hypothesis.  Currently she is not smoking.  But pulmonary function test shows continued decline alpha-1 antitrypsin is normal.  She feels the Tyvaso  is helping her.  She is up-to-date with the COVID and flu shot this season.  Discussed RSV vaccine and she will have that.  She continues CPAP for sleep apnea.  Of note previous CT scan has reported emphysema.  She states.  I did not help her in the past but she is willing to try sample Trelegy.    OV 12/29/2022  Subjective:  Patient ID: Patty [name], female , DOB: November 21, 1951 , age 58 y.o. , MRN: [id number] , ADDRESS: [address] [place] [address] PCP Luevenia Saha, MD Patient Care Team: Luevenia Saha, MD as PCP - General (Family Medicine) Loyde Rule, MD as PCP - Cardiology (Cardiology) Pyrtle, Amber Bail, MD as Consulting Physician (Gastroenterology) [facility],  Julian Obey, MD as Consulting Physician (Endocrinology) Maire Scot, MD as Consulting Physician (Pulmonary Disease)  This Provider for this visit: Treatment Team:  Attending Provider: Maire Scot, MD    12/29/2022 -   Chief Complaint  Patient presents with   Follow-up    Pulled muscles behind right knee area.  Unable to very active during August, Sept, Oct and first part of November 2023.  Oxygen  drops with exertion while using  6 lpm pulsed oxygen .  Trelegy did not improve breathing, took both samples and stopped.  HPI Patty [name] 73 y.o. -returns for follow-up.  Since her last visit there is no further sewage exposure although the sewage drain itself is not fixed.  She has had difficulty getting an attorney.  She did get pulmonary function test recently and shows continued stability in both FVC and DLCO since September 2022 which  was the last time.  Of sewage exposure.  It is definitely progressive sieve since February 2022 and 2021.  Symptoms course recently is stable.  She says she is improved her effort tolerance and is able to do 2.7 METS exercise.  She says recently she overdid her exercise and in the summer 2023 around August she pulled her right hamstring.  After that she was given some muscle relaxant that made her confused and had side effects.  Around the same time she was also on Trelegy and she not sure if some of the side effects were from Trelegy.  She did get tachycardic with Trelegy.  Therefore she does not want to do this anymore.  Last month when she had pulmonary function test albuterol  seem to help her so she just wants albuterol  for rescue and she did not want any long-acting beta agonist.  Previously we tried Spiriva  and she does not want to try this again.  She is up-to-date with her vaccines.  Overall she is feeling stable using 2 L at rest and 6 L with exertion.  Symptom score also shows stability.     OV 04/06/2023  Subjective:  Patient ID: Patty [name], female , DOB: 1951-12-07 , age 89 y.o. , MRN: [id number] , ADDRESS: [address] [place] [address] PCP Luevenia Saha, MD Patient Care Team: Luevenia Saha, MD as PCP - General (Family Medicine) Loyde Rule, MD as PCP - Cardiology (Cardiology) Pyrtle, Amber Bail, MD as Consulting Physician (Gastroenterology) [facility], Julian Obey, MD as Consulting Physician (Endocrinology) Maire Scot, MD as Consulting Physician (Pulmonary Disease)  This Provider for this visit: Treatment Team:  Attending Provider: Maire Scot, MD  04/06/2023 -   Chief Complaint  Patient presents with   Follow-up    Pressure sores ??      HPI Patty [name] 73 y.o. -returns for her WHO group 3 pulm hypertension associated with emphysema [previous CT reading has ruled out ILD].  It was previously determined that she does not have ILD.  She is tolerating her Tyvaso  fine.  She is feeling stable.  Walking desaturation test was stable.  She reports easy tachycardia.  She has seen Dr. Nishan  for this and she has been reassured.  We discussed doing beta-blockers but she is not interested.  She notes tachycardia when she is on the treadmill with her oxygen  on.  The heart rate goes around to 130-135.  Her age-predicted max is around 120/min.  She is not necessarily symptomatic.  I did indicate to her that as long as it is 15 130-135 and she is asymptomatic she can continue to monitor.  Discussed physiology of heart rate response.  She continues a CPAP at night with oxygen .  She continues albuterol  as needed.  Her last CT scan of the chest was in April 2023.  She is a former 45 pack smoker  Of note she still has not had any further sewage exposure from the neighbor since 2022.  Her last pulmonary function test December 2023 showed continued stability although it is worse compared to 2021 and early 2022 [preexposure].  She is still considering her legal options.        OV  10/25/2023  Subjective:  Patient ID: Patty [name], female , DOB: [birth year] , age 80 y.o. , MRN: [id number] , ADDRESS: [address] [place] [address] PCP Luevenia Saha, MD Patient Care Team: Luevenia Saha, MD as PCP - General (  Family Medicine) Loyde Rule, MD as PCP - Cardiology (Cardiology) Pyrtle, Amber Bail, MD as Consulting Physician (Gastroenterology) [facility], Julian Obey, MD as Consulting Physician (Endocrinology) Maire Scot, MD as Consulting Physician (Pulmonary Disease)  This Provider for this visit: Treatment Team:  Attending Provider: Maire Scot, MD    10/25/2023 -   Chief Complaint  Patient presents with   Follow-up      HPI Patty [name] 73 y.o. -returns for follow-up.  She was supposed have pulmonary function test today but they scheduled with the [facility] location with the visit in the [facility] location.  She felt she did not have sufficient time to turn around and get to the clinic visit on time.  She also told me that the appointment was scheduled for 4 PM although in my computer short of his 3:30 PM.  In any event she feels she is up and down stable symptoms with the weather pulmonary wise overall she is stable.  She did have a high-resolution CT chest.  There is no pneumonia or cancer.  However there is mild UIP that is according to radiology progressive over time.  She has several other issues that she brought up with me - She is in litigation with her neighbor the church property which seem to sewage water that made her lung function decline. - Her cat died in March 15, 2024from neurotoxicity which she believes is from the sewage water - Recently she been having leg cramps and she saw physical therapy which then made pain in her right lower extremity go up.  Apparently she has had neuro eval and is also concerned there is neurotoxicity.  Or neuropathy.  She believes all this might be related to the switch and  the cat - She has been diagnosed with left eye wet macular degeneration and she tried to have a stent but had tolerance problems.   On walking desaturation test today she desaturated quarter lap.;  This appears to be slowly progressive. She is tolerating inhaled treprostinil   CT Chest data from date: HRCT 10/10/23 as below Narrative & Impression  CLINICAL DATA:  73 year old female with history of chronic respiratory hypoxia. Former smoker.   EXAM: CT CHEST WITHOUT CONTRAST   TECHNIQUE: Multidetector CT imaging of the chest was performed following the standard protocol without intravenous contrast. High resolution imaging of the lungs, as well as inspiratory and expiratory imaging, was performed.   RADIATION DOSE REDUCTION: This exam was performed according to the departmental dose-optimization program which includes automated exposure control, adjustment of the mA and/or kV according to patient size and/or use of iterative reconstruction technique.   COMPARISON:  High-resolution chest CT 03/30/2022.   FINDINGS: Cardiovascular: Heart size is normal. There is no significant pericardial fluid, thickening or pericardial calcification. There is aortic atherosclerosis, as well as atherosclerosis of the great vessels of the mediastinum and the coronary arteries, including calcified atherosclerotic plaque in the left anterior descending, left circumflex and right coronary arteries.   Mediastinum/Nodes: No pathologically enlarged mediastinal or hilar lymph nodes. Please note that accurate exclusion of hilar adenopathy is limited on noncontrast CT scans. Esophagus is unremarkable in appearance.   Lungs/Pleura: High-resolution images demonstrates some patchy peripheral predominant areas of ground-glass attenuation, scattered areas of mild septal thickening, occasional subpleural reticulation, as well as some scattered areas of mild cylindrical bronchiectasis and peripheral  bronchiolectasis. These findings have a mild craniocaudal gradient and appear slightly progressive compared to prior studies. Inspiratory and expiratory imaging is unremarkable. No acute  consolidative airspace disease. No pleural effusions. No definite suspicious appearing pulmonary nodules or masses are noted.   Upper Abdomen: Nodular contour of the liver suggesting underlying cirrhosis.   Musculoskeletal: There are no aggressive appearing lytic or blastic lesions noted in the visualized portions of the skeleton.   IMPRESSION: 1. Subtle fibrotic changes in the lungs which appear minimally progressive compared to the prior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

## 2024-05-07 ENCOUNTER — Encounter: Payer: Self-pay | Admitting: Internal Medicine

## 2024-05-07 ENCOUNTER — Ambulatory Visit (INDEPENDENT_AMBULATORY_CARE_PROVIDER_SITE_OTHER): Payer: Medicare Other | Admitting: Internal Medicine

## 2024-05-07 VITALS — BP 125/74 | HR 103 | Ht 63.0 in | Wt 266.0 lb

## 2024-05-07 DIAGNOSIS — J9611 Chronic respiratory failure with hypoxia: Secondary | ICD-10-CM

## 2024-05-07 DIAGNOSIS — G4733 Obstructive sleep apnea (adult) (pediatric): Secondary | ICD-10-CM | POA: Diagnosis not present

## 2024-05-07 DIAGNOSIS — J439 Emphysema, unspecified: Secondary | ICD-10-CM | POA: Diagnosis not present

## 2024-05-07 DIAGNOSIS — J849 Interstitial pulmonary disease, unspecified: Secondary | ICD-10-CM

## 2024-05-07 DIAGNOSIS — I2723 Pulmonary hypertension due to lung diseases and hypoxia: Secondary | ICD-10-CM

## 2024-05-07 DIAGNOSIS — J8410 Pulmonary fibrosis, unspecified: Secondary | ICD-10-CM

## 2024-05-16 ENCOUNTER — Encounter: Payer: Self-pay | Admitting: Internal Medicine

## 2024-05-17 NOTE — Telephone Encounter (Signed)
 Please see pt msg and advise. Looks like referral to ID was closed since they were unable to reach patient to schedule after multiple attempts.

## 2024-05-21 ENCOUNTER — Encounter: Payer: Self-pay | Admitting: Neurology

## 2024-05-21 ENCOUNTER — Ambulatory Visit: Payer: Medicare Other | Admitting: Neurology

## 2024-05-21 VITALS — BP 141/85 | HR 81 | Ht 64.0 in | Wt 269.0 lb

## 2024-05-21 DIAGNOSIS — R269 Unspecified abnormalities of gait and mobility: Secondary | ICD-10-CM

## 2024-05-21 DIAGNOSIS — M62838 Other muscle spasm: Secondary | ICD-10-CM | POA: Diagnosis not present

## 2024-05-21 MED ORDER — DULOXETINE HCL 20 MG PO CPEP: 20.0000 mg | ORAL_CAPSULE | Freq: Every day | ORAL | 5 refills | Status: DC

## 2024-05-21 NOTE — Progress Notes (Unsigned)
 Chief Complaint  Patient presents with   Follow-up    Rm12, alone, Gait abnormality: pt stated that gait depends on the day and bilateral leg weakness progressively worsening,  Muscle spasm 3-4 mo f/u:denied recents spasm happens sporadically in calf or thigh. Pt mentioned bilateral hand tremors and bilateral feet tremors      ASSESSMENT AND PLAN  Patty [name] is a 73 y.o. female Intermittent gait instability Frequent lower extremity muscle spasm Extensive imaging and laboratory evaluation failed to demonstrate clear etiology  Her symptoms has improved with gabapentin  as needed History of Crohn's disease, segment of small and large intestine resection in the past, check iron copper level,  If there is no significant abnormality found, we essentially complete neurological workup, no significant abnormality on neurological examination, may continue gabapentin  as needed, add on Cymbalta 20 mg, if it is helpful may continue titrating to higher dose   DIAGNOSTIC DATA (LABS, IMAGING, TESTING) - I reviewed patient records, labs, notes, testing and imaging myself where available.   MEDICAL HISTORY:  Patty [name] is a 73 year old female, seen in request by her primary care physician Dr. Jonelle Neri, Aron Lard for evaluation of gait abnormality, right leg muscle spasm, initial evaluation November 14, 2023  History is obtained from the patient and review of electronic medical records. I personally reviewed pertinent available imaging films in PACS.   PMHx of  Anxiety, Chronic insomnia, Prozac , Clonazepam  DVT, PE following right hip surgery in 2018 Polycythemia vera CAD Interstitial pulmonary diease, on 24x7 oxygen  2019 History of smoke  She had a history of interstitial pulmonary disease, began to use oxygen  since 2019, but remain active, enjoying the hiking, workout at the gym 3 times a week  But over the past couple years, she noticed frequent muscle spasm, easily pull her  muscle doing routine daily activity, in summer 2023, bending over to pick up object from the floor, she pulled her right lateral leg muscle, improved after resting, then 3 months later, pulling a cart, turning her body over, she experienced horrible pain in her right posterior thigh, difficulty walking, seeing orthopedic clinic Dr. Jacqulyne Maxim, was given the diagnosis of hamstring injury, she improved some with physical therapy, was noted to have smaller right calf, had extensive evaluation, MRI of lumbar spine, EMG nerve conduction study, there was no clear etiology found to explain the abnormal findings per patient, but I do not have the record  She noticed gradual onset of memory loss over the past couple years, denies bowel and bladder incontinence, to have shoulder tension, occipital area pain,  She also reported few months history of exposure to sewage gas at her house in the spring 2024  UPDATE May 27th 2025: She lives alone, no children, she spend most time alone at home, she likes to work on her computer, scanning her photos,  she continue complains of frequent muscle cramping, often triggered by certain movement  She had a history of Crohn's disease, had large segment of small and large intestine resection in 1993,  She is also worried about side effect of Tyvaso , which she is taking for her interstitial lung disease, of the medicine seems to help her muscle spasm episode some  Gabapentin  as needed seems to be helpful as well  Had extensive evaluation for complaints of frequent muscle spasm, gait instability  MRI of cervical spine December 2024, multilevel degenerative changes no significant canal stenosis variable degree of foraminal narrowing  MRI of the brain, mild age-related related changes MRI of  thoracic spine, no significant canal foraminal narrowing  Extensive laboratory evaluations showed normal or negative RPR, folic acid , TSH, CPK, ANA, CBC, Vit D, B12,  A1C 6.8.  PHYSICAL  EXAM:   Vitals:   05/21/24 1448 05/21/24 1454  BP: (!) 146/69 (!) 141/85  Pulse:  81  Weight:  269 lb (122 kg)  Height:  5\' 4"  (1.626 m)     Body mass index is 46.17 kg/m.  PHYSICAL EXAMNIATION:  Gen: NAD, conversant, well nourised, well groomed                     Cardiovascular: Regular rate rhythm, no peripheral edema, warm, nontender. Eyes: Conjunctivae clear without exudates or hemorrhage Neck: Supple, no carotid bruits. Pulmonary: Clear to auscultation bilaterally   NEUROLOGICAL EXAM:  MENTAL STATUS: Speech/cognition: Awake, alert, oriented to history taking and casual conversation CRANIAL NERVES: CN II: Visual fields are full to confrontation. Pupils are round equal and briskly reactive to light. CN III, IV, VI: extraocular movement are normal. No ptosis. CN V: Facial sensation is intact to light touch CN VII: Face is symmetric with normal eye closure  CN VIII: Hearing is normal to causal conversation. CN IX, X: Phonation is normal. CN XI: Head turning and shoulder shrug are intact  MOTOR: There was no significant bilateral lower extremity proximal and distal muscle weakness noted  REFLEXES: Reflexes are 2+ and symmetric at the biceps, triceps, 3/3 knees, and ankles. Plantar responses are extensor bilaterally  SENSORY: Intact to light touch, pinprick and vibratory sensation are intact in fingers and toes.  COORDINATION: There is no trunk or limb dysmetria noted.  GAIT/STANCE: Get up from seated position crossed, cautious  REVIEW OF SYSTEMS:  Full 14 system review of systems performed and notable only for as above All other review of systems were negative.   ALLERGIES: Allergies  Allergen Reactions   Tizanidine Hcl Shortness Of Breath   Victoza  [Liraglutide ] Shortness Of Breath   Metformin  And Related Diarrhea    Increased moodiness   Onion Other (See Comments)    sereve diarrhea, stomach pains (Crohn's flares)   Avastin  [Bevacizumab ]    Lasix   [Furosemide ] Itching    Hypersensitivity - pt can take as needed    Pirfenidone  Other (See Comments)    GI side effects   Potassium-Containing Compounds Other (See Comments)    Due to chron's, difficult passing though kidneys not allowing absorption in the body    Prednisone Other (See Comments)    Altered mood   Sulfa Drugs Cross Reactors Other (See Comments)    "feels like bugs crawling on me"   Trelegy Ellipta  [Fluticasone-Umeclidin-Vilant] Palpitations    HOME MEDICATIONS: Current Outpatient Medications  Medication Sig Dispense Refill   albuterol  (VENTOLIN  HFA) 108 (90 Base) MCG/ACT inhaler Inhale 2 puffs into the lungs every 6 (six) hours as needed for wheezing or shortness of breath. 8 g 6   amoxicillin  (AMOXIL ) 500 MG capsule Take 4 capsules (2,000 mg total) by mouth once as needed for up to 1 dose. Before dental procedures 20 capsule 1   aspirin  EC 81 MG tablet Take 81 mg by mouth daily.     Calcium Carbonate-Vit D-Min (CALCIUM 1200) 1200-1000 MG-UNIT CHEW Chew 1 tablet by mouth daily.     CANNABIDIOL PO Take 1 Dose by mouth 2 (two) times daily as needed (anxiety/sleep.). CBD Oil     cetirizine  (ZYRTEC ) 10 MG tablet Take 10 mg by mouth daily as needed (sinus/allergies.).  cetirizine -pseudoephedrine (ZYRTEC -D) 5-120 MG tablet Take 1 tablet by mouth daily as needed (sinus headaches.).     clonazePAM  (KLONOPIN ) 0.5 MG tablet Take 1 tablet (0.5 mg total) by mouth daily as needed for anxiety. 90 tablet 1   cyanocobalamin  (VITAMIN B12) 1000 MCG/ML injection Inject 1 mL (1,000 mcg total) into the muscle every 30 (thirty) days. 10 mL 1   FLUoxetine  (PROZAC ) 20 MG capsule Take 1 capsule (20 mg total) by mouth daily. 90 capsule 3   fluticasone (FLONASE) 50 MCG/ACT nasal spray Place 1-2 sprays into both nostrils daily as needed for allergies or rhinitis.     furosemide  (LASIX ) 20 MG tablet Take 1 tablet (20 mg total) by mouth as needed for fluid. 30 tablet 11   gabapentin  (NEURONTIN ) 100  MG capsule Take 3 capsules (300 mg total) by mouth at bedtime. 90 capsule 11   Glycerin-Polysorbate 80 (REFRESH DRY EYE THERAPY OP) Place 1 drop into both eyes daily.     hydrocortisone  (PROCTOZONE -HC) 2.5 % rectal cream Place 1 application rectally 2 (two) times daily as needed (Crohn's flare). 28 g 2   ibuprofen (ADVIL) 200 MG tablet Take 400-800 mg by mouth every 8 (eight) hours as needed (pain.).     mesalamine (PENTASA) 250 MG CR capsule Take 250-500 mg by mouth 4 (four) times daily as needed (crohn's disease flare ups).     Multiple Vitamins-Minerals (ICAPS AREDS 2 PO) Take 1 tablet by mouth daily.     potassium chloride  (MICRO-K ) 10 MEQ CR capsule Take 1 capsule (10 mEq total) by mouth daily as needed (when taking furosemide ). 30 capsule 3   prednisoLONE acetate (PRED FORTE) 1 % ophthalmic suspension Place 1 drop into both eyes 2 (two) times daily as needed (Uveitis).     Probiotic Product (PROBIOTIC PO) Take 1 capsule by mouth 2 (two) times a week. As needed     SYRINGE-NEEDLE, DISP, 3 ML (LUER LOCK SAFETY SYRINGES) 25G X 1" 3 ML MISC USE AS DIRECTED 100 each 5   traMADol  (ULTRAM ) 50 MG tablet Take 1 tablet (50 mg total) by mouth every 6 (six) hours as needed. 20 tablet 0   Treprostinil  (TYVASO  REFILL) 0.6 MG/ML SOLN Inhale 54 mcg into the lungs in the morning, at noon, in the evening, and at bedtime. 87 mL 11   triamcinolone  cream (KENALOG ) 0.1 % Apply 1 Application topically 2 (two) times daily. 30 g 0   No current facility-administered medications for this visit.    PAST MEDICAL HISTORY: Past Medical History:  Diagnosis Date   Abnormal nuclear stress test    a. 07/2017: cath with no CAD   Anxiety    Aortic atherosclerosis (HCC)    Arthritis    Chronic diastolic CHF (congestive heart failure) (HCC)    Coronary artery calcification seen on CT scan    Crohn disease (HCC)    DVT (deep venous thrombosis) (HCC)    Dyspnea    Grade I diastolic dysfunction    Hyperlipidemia    ILD  (interstitial lung disease) (HCC)    OSA on CPAP    CPAP    AND O2   Pernicious anemia    Polycythemia vera (HCC)    Pulmonary embolism (HCC) 2010   s/p hip surgery , reports this was never truly confirmed    Pulmonary hypertension (HCC)    Sleep disorder 01/10/2020   Uses klonopin  for sleep since 2014; sleep walking. And anxiety    PAST SURGICAL HISTORY: Past Surgical History:  Procedure Laterality Date    RIGHT BREAST LUMPECTOMY WITH RADIOACTIVE SEED LOCALIZATION (Right Breast)  02/04/2021   BIOPSY  09/30/2019   Procedure: BIOPSY;  Surgeon: Nannette Babe, MD;  Location: [facility];  Service: Gastroenterology;;   BREAST LUMPECTOMY WITH RADIOACTIVE SEED LOCALIZATION Right 02/04/2021   Procedure: RIGHT BREAST LUMPECTOMY WITH RADIOACTIVE SEED LOCALIZATION;  Surgeon: Oza Blumenthal, MD;  Location[facility];  Service: General;  Laterality: Right;   CARDIAC CATHETERIZATION     COLON RESECTION  1993   COLONOSCOPY WITH PROPOFOL  N/A 09/30/2019   Procedure: COLONOSCOPY WITH PROPOFOL ;  Surgeon: Nannette Babe, MD;  Location: [facility];  Service: Gastroenterology;  Laterality: N/A;   HIP ARTHROPLASTY Right    x 2, initial right THA, then subsequent right THA revision    LEFT HEART CATH AND CORONARY ANGIOGRAPHY N/A 07/28/2017   Procedure: Left Heart Cath and Coronary Angiography;  Surgeon: Odie Benne, MD;  Location: [facility] INVASIVE CV LAB;  Service: Cardiovascular;  Laterality: N/A;   POLYPECTOMY  09/30/2019   Procedure: POLYPECTOMY;  Surgeon: Nannette Babe, MD;  Location: [facility];  Service: Gastroenterology;;   RIGHT HEART CATH N/A 12/07/2020   Procedure: RIGHT HEART CATH;  Surgeon: Arnoldo Lapping, MD;  Location[facility] INVASIVE CV LAB;  Service: Cardiovascular;  Laterality: N/A;    FAMILY HISTORY: Family History  Problem Relation Age of Onset   Glaucoma Mother    High blood pressure Mother    High Cholesterol Mother    Sleep apnea Mother    Diabetes Mellitus II Father     Sleep apnea Father    Anxiety disorder Father    COPD Brother    Heart disease Brother    Non-Hodgkin's lymphoma Brother    Diabetes Mellitus II Brother    Glaucoma Brother    Colon cancer Neg Hx    Esophageal cancer Neg Hx    Pancreatic cancer Neg Hx    Stomach cancer Neg Hx    Liver disease Neg Hx     SOCIAL HISTORY: Social History   Socioeconomic History   Marital status: Single    Spouse name: Not on file   Number of children: 0   Years of education: Not on file   Highest education level: Associate degree: academic program  Occupational History   Occupation: retired  Tobacco Use   Smoking status: Former    Current packs/day: 0.00    Average packs/day: 1 pack/day for 45.0 years (45.0 ttl pk-yrs)    Types: Cigarettes    Start date: 12/27/1963    Quit date: 12/26/2008    Years since quitting: 15.4   Smokeless tobacco: Never  Vaping Use   Vaping status: Never Used  Substance and Sexual Activity   Alcohol use: Yes    Comment: occ   Drug use: No   Sexual activity: Not Currently  Other Topics Concern   Not on file  Social History Narrative   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    Social Drivers of Corporate investment banker Strain: Low Risk  (03/03/2024)   Overall Financial Resource Strain (CARDIA)    Difficulty of Paying Living Expenses: Not hard at all  Food Insecurity: No Food Insecurity (03/03/2024)   Hunger Vital Sign    Worried About Running Out of Food in the Last Year: Never true    Ran Out of Food in the Last Year: Never true  Transportation Needs: No Transportation Needs (03/03/2024)  PRAPARE - Administrator, Civil Service (Medical): No    Lack of Transportation (Non-Medical): No  Physical Activity: Sufficiently Active (03/03/2024)   Exercise Vital Sign    Days of Exercise per Week: 2 days    Minutes of Exercise per Session: 120 min  Stress: No Stress Concern Present (03/03/2024)   [employer]  of Occupational Health - Occupational Stress Questionnaire    Feeling of Stress : Not at all  Social Connections: Moderately Integrated (03/03/2024)   Social Connection and Isolation Panel [NHANES]    Frequency of Communication with Friends and Family: Twice a week    Frequency of Social Gatherings with Friends and Family: Once a week    Attends Religious Services: More than 4 times per year    Active Member of [employer] or Organizations: Yes    Attends Banker Meetings: 1 to 4 times per year    Marital Status: Never married  Intimate Partner Violence: Not At Risk (12/03/2020)   Humiliation, Afraid, Rape, and Kick questionnaire    Fear of Current or Ex-Partner: No    Emotionally Abused: No    Physically Abused: No    Sexually Abused: No      Phebe Brasil, M.D. Ph.D.  [facility] [address] [place], [place] [address] Ph: [phone] Fax: [phone]  CC:  Luevenia Saha, MD [address] [place],  [place] [address]  Luevenia Saha, MD    I personally spent a total of 45 minutes in the care of the patient today including getting/reviewing separately obtained history, performing a medically appropriate exam/evaluation, counseling and educating, placing orders, documenting clinical information in the EHR, independently interpreting results, and communicating results.

## 2024-05-22 ENCOUNTER — Telehealth: Payer: Self-pay | Admitting: Family Medicine

## 2024-05-22 DIAGNOSIS — J849 Interstitial pulmonary disease, unspecified: Secondary | ICD-10-CM

## 2024-05-22 NOTE — Telephone Encounter (Signed)
 Received email from toxicology physician.  See below.  No need for consultation.  I will message patient.  From: Kopec, Kathryn T [email or web address]>  Sent: Wednesday, May 22, 2024 3:36 PM To: Karma [email or web address]> Subject: Alain Aliment - Medical Toxicology referral follow up    *Caution - External email - see footer for warnings* Dr. Jonelle Neri -    Thank you for the referral of Ms. Leellen Puller to our Medical Toxicology clinic at [facility] in [place]. I have reached out to your office twice now over the past few weeks and have not been able to get a hold of you.    I have reviewed Ms. Trower's chart as well as multiple documents that she provided to our administrative assistant based on questions that we had. Her exposure to fumes from a sewage leak for 4 months, approximately 3 years ago. Unfortunately, at that time there was no definitive testing done on the air, water, or soil, that specified an agent exposure type. It was related to a sewage spill from a local church's utility pipes leading to reported gases/vapors near her return air vent. It was worse on days of the week when there were church services. The patient is not sure what vapors were being excreted but was concerned for hydrogen sulfide or other gases. Ms. Fossum has had multiple symptoms since then and has been seen by pulmonary and neurology without a specific diagnosis of her symptoms being determined. The question arose on whether her symptoms might be from this vapor exposure.    It is very hard to determine exactly what Ms. Guadiana might have been exposed to at that time but the most likely vapor coming from a sewage line is hydrogen sulfide. Hydrogen sulfide is a highly toxic, colorless gas. It is denser than air with a classic rotten egg odor. H2S is produced by biological degradation of sulfur-containing products (i.e., fish, sewage, and manure). Most exposures are via  inhalation. Its mechanism of toxicity involves interrupting oxidative phosphorylation by inhibition of cytochrome oxidase A3. Thus, effectively causes cellular asphyxia. There is no specific blood or urine testing that can be done for H2S exposure. Air testing could be done at the site at the time of exposure to determine concentration levels.  Symptoms can range from mild irritant effects of the airway and eyes at low levels to rapid and profound CNS depression/LOC at high levels.  Other findings may include headache, dizziness, dermatitis, nausea, pneumonitis and pulmonary edema.  Complete recovery can be seen in patients rapidly removed from the source.  However, permanent effects can be seen with prolonged exposure and apnea/hypoxic injury. Ms. Settle did not have a reported apneic or hypoxic event and there was no report of syncope or LOC so likely did not have a high-level exposure.    Treatment for hydrogen sulfide exposure is supportive and symptomatic care. Main step is to remove the source/exposure and get to fresh air.    Testing the air, water or soil in the area at this time is not going to be helpful as it does not demonstrate what was present there 3 years ago. There are also no blood tests for "vapors" or "gases" that can be done. That being said on review of the symptoms she is having it is unlikely that they were caused by her potential vapor inhalation this far out. Initially she may have had some irritant effects from the vapors and potential low dose hydrogen sulfide from the sewage pipes that  may have worsened her underlying pulmonary disease, but it would not be persistent. It would have been resolved following the removal from the source. Ms. Grandison never reported having syncope or LOC in her home, therefore the levels of H2S were never high enough to lead to apnea or hypoxic injury which in what occurs in the cases we see permanent effects from. Other vapors that may have been  released would have likely also been irritant in nature and lead to symptoms such as dyspnea, cough, scratchy throat, nose irritation, redness in her eyes, eye irritation, wheezing. Those symptoms too would have been temporary during the exposure and would have resolved after the exposure was gone. They would be unlikely to cause any permanent or long-lasting symptoms unless she had had a hypoxic event.    At this time, we do not feel there is an indication for Ms. Fotopoulos to be seen in our outpatient clinic as we do not have any testing that can be recommended at this time. There is no specific treatment that is indicated at this time. It is unlikely to be confirmed what specific gases that she may have been exposed too but it is unlikely that any of them are causing her current symptoms. We would recommend that you continue to look for other causes and treat symptomatically.  Happy to discuss the case more if you would like.   Kathy    Kathryn Kopec, DO Professor Division Director Medical Toxicology Department of Emergency Medicine [facility], [place], [place]   A total of 15 minutes were spent by me to personally review the patient-generated inquiry, review patient records and data pertinent to assessment of the patient's problem, develop a management plan including generation of prescriptions and/or orders, and on subsequent communication with the patient through secure the MyChart portal service. There is no separately reported E/M service related to this service in the past 7 days nor does the patient have an upcoming soonest available appointment for this issue. This work was completed in less than 7 days.

## 2024-05-23 ENCOUNTER — Other Ambulatory Visit: Payer: Self-pay | Admitting: Internal Medicine

## 2024-05-23 LAB — IRON,TIBC AND FERRITIN PANEL
Ferritin: 34 ng/mL (ref 15–150)
Iron Saturation: 16 % (ref 15–55)
Iron: 57 ug/dL (ref 27–139)
Total Iron Binding Capacity: 358 ug/dL (ref 250–450)
UIBC: 301 ug/dL (ref 118–369)

## 2024-05-23 LAB — COPPER, SERUM: Copper: 105 ug/dL (ref 80–158)

## 2024-05-23 NOTE — Telephone Encounter (Signed)
 Please contact patient and schedule pt appt to see PCP per Jonelle Neri request

## 2024-05-27 ENCOUNTER — Ambulatory Visit: Payer: Self-pay | Admitting: Neurology

## 2024-05-29 ENCOUNTER — Encounter: Payer: Self-pay | Admitting: Family Medicine

## 2024-05-29 ENCOUNTER — Ambulatory Visit (INDEPENDENT_AMBULATORY_CARE_PROVIDER_SITE_OTHER): Admitting: Family Medicine

## 2024-05-29 VITALS — BP 118/70 | HR 98 | Temp 98.4°F | Ht 64.0 in | Wt 267.4 lb

## 2024-05-29 DIAGNOSIS — Z209 Contact with and (suspected) exposure to unspecified communicable disease: Secondary | ICD-10-CM | POA: Diagnosis not present

## 2024-05-29 DIAGNOSIS — J849 Interstitial pulmonary disease, unspecified: Secondary | ICD-10-CM | POA: Diagnosis not present

## 2024-05-29 DIAGNOSIS — M62838 Other muscle spasm: Secondary | ICD-10-CM | POA: Diagnosis not present

## 2024-05-29 NOTE — Progress Notes (Signed)
 Subjective  CC:  Chief Complaint  Patient presents with   Concerns    Pt wanted to speak with provider about the toxicology report    HPI: Patty [name] is a 73 y.o. female who presents to the office today to address the problems listed above in the chief complaint. Patty [name] is here to discuss toxicology consult.  I did refer her as she requested.  To review, she is exposed to sewage waste several years ago and has multiple symptoms that she feels could be related.  Toxicologist was thorough in reviewing her chart and sent an email that I had documented in the prior note.  She does not feel that there is any evidence that there is a link to her symptoms and unfortunately there is no further way to pursue an evaluation.  Therefore she does not feel that she could be helpful currently to the patient.  We discussed this in detail today. We did review her neurology evaluation which was negative. She tried Cymbalta  for 3 days but had side effects including nausea and headaches and stopped it.  Of note, it did help stop the muscle spasms  Assessment  1. Exposure to biological agent   2. ILD (interstitial lung disease) (HCC)   3. Muscle spasm      Plan  Exposure to sewage: Patient understands no need for tox allergy consultation at this time Continue with pulmonology for ILD  Follow up: As needed 09/04/2024  No orders of the defined types were placed in this encounter.  No orders of the defined types were placed in this encounter.     I reviewed the patients updated PMH, FH, and SocHx.    Patient Active Problem List   Diagnosis Date Noted   Type 2 diabetes mellitus with hyperglycemia, without long-term current use of insulin  (HCC) 12/04/2023    Priority: High   Sleep disorder 01/10/2020    Priority: High   Pernicious anemia 01/10/2020    Priority: High   Chronic prescription benzodiazepine use 01/10/2020    Priority: High   Morbid obesity (HCC) 09/24/2019    Priority: High    History of DVT (deep vein thrombosis), with PE 07/25/2019    Priority: High   OSA on CPAP     Priority: High   Crohn disease (HCC) 06/15/2017    Priority: High   ILD (interstitial lung disease) (HCC) 05/29/2017    Priority: High   Osteopenia 08/05/2020    Priority: Medium    Adjustment disorder with anxiety 04/29/2020    Priority: Medium    Combined hyperlipidemia associated with type 2 diabetes mellitus (HCC) 09/24/2019    Priority: Medium    History of polycythemia vera 11/12/2014    Priority: Medium    Chronic allergic rhinitis 01/10/2020    Priority: Low   WHO group 3 pulmonary arterial hypertension (HCC) 03/04/2024   Muscle spasm 01/25/2024   Gait abnormality 11/14/2023   Low back pain 08/03/2023   Pulmonary emphysema with fibrosis of lung (HCC) 01/31/2022   Sclerosing adenosis of right breast 02/04/2021   Benign neoplasm of rectum    Chronic respiratory failure with hypoxia (HCC) 05/29/2017   Anemia 12/25/2015   Complications due to internal joint prosthesis (HCC) 11/12/2014   Deep venous thrombosis (HCC) 11/12/2014   Polycythemia vera (HCC) 11/12/2014   Current Meds  Medication Sig   albuterol  (VENTOLIN  HFA) 108 (90 Base) MCG/ACT inhaler INHALE 2 PUFFS INTO THE LUNGS EVERY 6 HOURS AS NEEDED FOR WHEEZING OR  SHORTNESS OF BREATH   amoxicillin  (AMOXIL ) 500 MG capsule Take 4 capsules (2,000 mg total) by mouth once as needed for up to 1 dose. Before dental procedures   aspirin  EC 81 MG tablet Take 81 mg by mouth daily.   Calcium Carbonate-Vit D-Min (CALCIUM 1200) 1200-1000 MG-UNIT CHEW Chew 1 tablet by mouth daily.   CANNABIDIOL PO Take 1 Dose by mouth 2 (two) times daily as needed (anxiety/sleep.). CBD Oil   cetirizine  (ZYRTEC ) 10 MG tablet Take 10 mg by mouth daily as needed (sinus/allergies.).   cetirizine -pseudoephedrine (ZYRTEC -D) 5-120 MG tablet Take 1 tablet by mouth daily as needed (sinus headaches.).   clonazePAM  (KLONOPIN ) 0.5 MG tablet Take 1 tablet (0.5 mg  total) by mouth daily as needed for anxiety.   cyanocobalamin  (VITAMIN B12) 1000 MCG/ML injection Inject 1 mL (1,000 mcg total) into the muscle every 30 (thirty) days.   DULoxetine  (CYMBALTA ) 20 MG capsule Take 1 capsule (20 mg total) by mouth daily.   FLUoxetine  (PROZAC ) 20 MG capsule Take 1 capsule (20 mg total) by mouth daily.   fluticasone (FLONASE) 50 MCG/ACT nasal spray Place 1-2 sprays into both nostrils daily as needed for allergies or rhinitis.   furosemide  (LASIX ) 20 MG tablet Take 1 tablet (20 mg total) by mouth as needed for fluid.   gabapentin  (NEURONTIN ) 100 MG capsule Take 3 capsules (300 mg total) by mouth at bedtime.   Glycerin-Polysorbate 80 (REFRESH DRY EYE THERAPY OP) Place 1 drop into both eyes daily.   hydrocortisone  (PROCTOZONE -HC) 2.5 % rectal cream Place 1 application rectally 2 (two) times daily as needed (Crohn's flare).   ibuprofen (ADVIL) 200 MG tablet Take 400-800 mg by mouth every 8 (eight) hours as needed (pain.).   mesalamine (PENTASA) 250 MG CR capsule Take 250-500 mg by mouth 4 (four) times daily as needed (crohn's disease flare ups).   Multiple Vitamins-Minerals (ICAPS AREDS 2 PO) Take 1 tablet by mouth daily.   potassium chloride  (MICRO-K ) 10 MEQ CR capsule Take 1 capsule (10 mEq total) by mouth daily as needed (when taking furosemide ).   prednisoLONE acetate (PRED FORTE) 1 % ophthalmic suspension Place 1 drop into both eyes 2 (two) times daily as needed (Uveitis).   Probiotic Product (PROBIOTIC PO) Take 1 capsule by mouth 2 (two) times a week. As needed   SYRINGE-NEEDLE, DISP, 3 ML (LUER LOCK SAFETY SYRINGES) 25G X 1" 3 ML MISC USE AS DIRECTED   traMADol  (ULTRAM ) 50 MG tablet Take 1 tablet (50 mg total) by mouth every 6 (six) hours as needed.   Treprostinil  (TYVASO  REFILL) 0.6 MG/ML SOLN Inhale 54 mcg into the lungs in the morning, at noon, in the evening, and at bedtime.   triamcinolone  cream (KENALOG ) 0.1 % Apply 1 Application topically 2 (two) times daily.     Allergies: Patient is allergic to tizanidine hcl, victoza  [liraglutide ], metformin  and related, onion, avastin  [bevacizumab ], lasix  [furosemide ], pirfenidone , potassium-containing compounds, prednisone, sulfa drugs cross reactors, and trelegy ellipta  [fluticasone-umeclidin-vilant]. Family History: Patient family history includes Anxiety disorder in her father; COPD in her brother; Diabetes Mellitus II in her brother and father; Glaucoma in her brother and mother; Heart disease in her brother; High Cholesterol in her mother; High blood pressure in her mother; Non-Hodgkin's lymphoma in her brother; Sleep apnea in her father and mother. Social History:  Patient  reports that she quit smoking about 15 years ago. Her smoking use included cigarettes. She started smoking about 60 years ago. She has a 45 pack-year smoking history. She  has never used smokeless tobacco. She reports current alcohol use. She reports that she does not use drugs.  Review of Systems: Constitutional: Negative for fever malaise or anorexia Cardiovascular: negative for chest pain Respiratory: negative for SOB or persistent cough Gastrointestinal: negative for abdominal pain  Objective  Vitals: BP 118/70   Pulse 98   Temp 98.4 F (36.9 C)   Ht 5\' 4"  (1.626 m)   Wt 267 lb 6.4 oz (121.3 kg)   SpO2 94%   BMI 45.90 kg/m  General: no acute distress , A&Ox3   Commons side effects, risks, benefits, and alternatives for medications and treatment plan prescribed today were discussed, and the patient expressed understanding of the given instructions. Patient is instructed to call or message via MyChart if he/she has any questions or concerns regarding our treatment plan. No barriers to understanding were identified. We discussed Red Flag symptoms and signs in detail. Patient expressed understanding regarding what to do in case of urgent or emergency type symptoms.  Medication list was reconciled, printed and provided to the  patient in AVS. Patient instructions and summary information was reviewed with the patient as documented in the AVS. This note was prepared with assistance of Dragon voice recognition software. Occasional wrong-word or sound-a-like substitutions may have occurred due to the inherent limitations of voice recognition software

## 2024-06-03 ENCOUNTER — Ambulatory Visit: Admitting: Family Medicine

## 2024-06-06 ENCOUNTER — Encounter: Payer: Self-pay | Admitting: Internal Medicine

## 2024-06-06 DIAGNOSIS — J439 Emphysema, unspecified: Secondary | ICD-10-CM

## 2024-06-06 DIAGNOSIS — J9611 Chronic respiratory failure with hypoxia: Secondary | ICD-10-CM

## 2024-06-06 DIAGNOSIS — J849 Interstitial pulmonary disease, unspecified: Secondary | ICD-10-CM

## 2024-06-11 NOTE — Telephone Encounter (Signed)
 Order for pulmonary rehabilitation has been completed.  If the symptoms go away after stopping time also and then if you restarted and they come back then it is prove that 100% it is because of time also.  Sometimes some of the side effects especially with multiple medical problems are hard to discern if it is from the medication especially if they are uncommon side effects.  It seems like this the situation with what happened with time also.  In any event I am glad you are better after stopping the time was also we can just monitor and reassess.

## 2024-06-11 NOTE — Telephone Encounter (Signed)
**Note De-identified  [place] Obfuscation** Please advise 

## 2024-06-21 ENCOUNTER — Telehealth (HOSPITAL_COMMUNITY): Payer: Self-pay

## 2024-06-21 NOTE — Telephone Encounter (Signed)
 Pt insurance is active and benefits verified through [employer] B. Co-pay $0, DED $257/unknown met, out of pocket $0/$0 met, co-insurance 20%. No pre-authorization required. 06/21/2024 @ 9:21am.  2ndary insurance is active and benefits verified through [employer]. Co-pay $0, DED $0/$0 met, out of pocket $0/$0 met, co-insurance 0%. No pre-authorization required.

## 2024-06-24 ENCOUNTER — Telehealth (HOSPITAL_COMMUNITY): Payer: Self-pay

## 2024-06-24 NOTE — Telephone Encounter (Signed)
 LVM for pt regarding Pulm Rehab referral.

## 2024-06-25 ENCOUNTER — Telehealth (HOSPITAL_COMMUNITY): Payer: Self-pay | Admitting: *Deleted

## 2024-06-25 ENCOUNTER — Telehealth (HOSPITAL_COMMUNITY): Payer: Self-pay

## 2024-06-25 NOTE — Telephone Encounter (Signed)
 Pt returned call and wanted to schedule for Pulmonary Rehab. Scheduled pt for 07/10/24 orientation and will begin exercise 07/16/24. Will send information by MyChart.  Aliene Aris BS, ACSM-CEP 06/25/2024 3:59 PM

## 2024-06-26 NOTE — Telephone Encounter (Signed)
 SABRA

## 2024-07-01 ENCOUNTER — Other Ambulatory Visit: Payer: Self-pay | Admitting: Internal Medicine

## 2024-07-01 DIAGNOSIS — I2723 Pulmonary hypertension due to lung diseases and hypoxia: Secondary | ICD-10-CM

## 2024-07-01 DIAGNOSIS — J849 Interstitial pulmonary disease, unspecified: Secondary | ICD-10-CM

## 2024-07-02 NOTE — Telephone Encounter (Signed)
 Contacted patient regarding her Tyvaso  use via phone. Left a voicemail. MyChart message sent.   Deleta Colt PharmD Candidate [phone]  [facility]

## 2024-07-05 ENCOUNTER — Encounter: Payer: Self-pay | Admitting: Family Medicine

## 2024-07-05 ENCOUNTER — Other Ambulatory Visit: Payer: Self-pay | Admitting: Family Medicine

## 2024-07-05 NOTE — Telephone Encounter (Unsigned)
 Copied from CRM [id number]. Topic: Clinical - Medication Refill >> Jul 05, 2024  2:35 PM Mercedes MATSU wrote: Medication:  clonazePAM  (KLONOPIN ) 0.5 MG tablet   Has the patient contacted their pharmacy? Yes (Agent: If no, request that the patient contact the pharmacy for the refill. If patient does not wish to contact the pharmacy document the reason why and proceed with request.) (Agent: If yes, when and what did the pharmacy advise?)  This is the patient's preferred pharmacy:  [id number] [address], [place] - [address] AT [facility] [address] [place] [address] Phone: [phone] Fax: [phone]   Is this the correct pharmacy for this prescription? Yes If no, delete pharmacy and type the correct one.   Has the prescription been filled recently? Yes  Is the patient out of the medication? Yes  Has the patient been seen for an appointment in the last year OR does the patient have an upcoming appointment? Yes  Can we respond through MyChart? Yes  Agent: Please be advised that Rx refills may take up to 3 business days. We ask that you follow-up with your pharmacy.

## 2024-07-05 NOTE — Telephone Encounter (Signed)
 01/03/24 90 tabs/1 RF

## 2024-07-06 MED ORDER — CLONAZEPAM 0.5 MG PO TABS: 0.5000 mg | ORAL_TABLET | Freq: Every day | ORAL | 1 refills | Status: DC | PRN

## 2024-07-09 ENCOUNTER — Telehealth (HOSPITAL_COMMUNITY): Payer: Self-pay

## 2024-07-09 NOTE — Telephone Encounter (Signed)
 Called to confirm PR orientation for 07/10/24. No answer. Left VM.

## 2024-07-10 ENCOUNTER — Encounter (HOSPITAL_COMMUNITY)
Admission: RE | Admit: 2024-07-10 | Discharge: 2024-07-10 | Disposition: A | Discharge status: Home / Self Care | Source: Ambulatory Visit | Attending: Internal Medicine | Admitting: Internal Medicine

## 2024-07-10 ENCOUNTER — Encounter (HOSPITAL_COMMUNITY): Payer: Self-pay

## 2024-07-10 VITALS — BP 138/80 | HR 87 | Ht 64.0 in | Wt 274.7 lb

## 2024-07-10 DIAGNOSIS — J849 Interstitial pulmonary disease, unspecified: Secondary | ICD-10-CM | POA: Diagnosis present

## 2024-07-10 DIAGNOSIS — J9611 Chronic respiratory failure with hypoxia: Secondary | ICD-10-CM | POA: Diagnosis present

## 2024-07-10 NOTE — Progress Notes (Signed)
 Pulmonary Individual Treatment Plan  Patient Details  Name: Patty [name] MRN: [id number] Date of Birth: May 24, 1951 Referring Provider:   Conrad Ports Pulmonary Rehab Walk Test from 02/15/2021 in [facility] for Heart, Vascular, & Lung Health  Referring Provider Dr. Geronimo    Initial Encounter Date:  Flowsheet Row Pulmonary Rehab Walk Test from 02/15/2021 in [facility] for Heart, Vascular, & Lung Health  Date 02/15/21    Visit Diagnosis: ILD (interstitial lung disease) (HCC)  Chronic respiratory failure with hypoxia (HCC)  Patient's Home Medications on Admission:   Current Outpatient Medications:    albuterol  (VENTOLIN  HFA) 108 (90 Base) MCG/ACT inhaler, INHALE 2 PUFFS INTO THE LUNGS EVERY 6 HOURS AS NEEDED FOR WHEEZING OR SHORTNESS OF BREATH, Disp: 6.7 g, Rfl: 6   amoxicillin  (AMOXIL ) 500 MG capsule, Take 4 capsules (2,000 mg total) by mouth once as needed for up to 1 dose. Before dental procedures, Disp: 20 capsule, Rfl: 1   aspirin  EC 81 MG tablet, Take 81 mg by mouth daily., Disp: , Rfl:    Calcium Carbonate-Vit D-Min (CALCIUM 1200) 1200-1000 MG-UNIT CHEW, Chew 1 tablet by mouth daily., Disp: , Rfl:    CANNABIDIOL PO, Take 1 Dose by mouth 2 (two) times daily as needed (anxiety/sleep.). CBD Oil, Disp: , Rfl:    cetirizine  (ZYRTEC ) 10 MG tablet, Take 10 mg by mouth daily as needed (sinus/allergies.)., Disp: , Rfl:    cetirizine -pseudoephedrine (ZYRTEC -D) 5-120 MG tablet, Take 1 tablet by mouth daily as needed (sinus headaches.)., Disp: , Rfl:    clonazePAM  (KLONOPIN ) 0.5 MG tablet, Take 1 tablet (0.5 mg total) by mouth daily as needed for anxiety., Disp: 90 tablet, Rfl: 1   cyanocobalamin  (VITAMIN B12) 1000 MCG/ML injection, Inject 1 mL (1,000 mcg total) into the muscle every 30 (thirty) days., Disp: 10 mL, Rfl: 1   dorzolamide (TRUSOPT) 2 % ophthalmic solution, Place 1 drop into the right eye 2 (two) times daily., Disp: , Rfl:     FLUoxetine  (PROZAC ) 20 MG capsule, Take 1 capsule (20 mg total) by mouth daily., Disp: 90 capsule, Rfl: 3   fluticasone (FLONASE) 50 MCG/ACT nasal spray, Place 1-2 sprays into both nostrils daily as needed for allergies or rhinitis., Disp: , Rfl:    furosemide  (LASIX ) 20 MG tablet, Take 1 tablet (20 mg total) by mouth as needed for fluid., Disp: 30 tablet, Rfl: 11   gabapentin  (NEURONTIN ) 100 MG capsule, Take 3 capsules (300 mg total) by mouth at bedtime., Disp: 90 capsule, Rfl: 11   hydrocortisone  (PROCTOZONE -HC) 2.5 % rectal cream, Place 1 application rectally 2 (two) times daily as needed (Crohn's flare)., Disp: 28 g, Rfl: 2   ibuprofen (ADVIL) 200 MG tablet, Take 400-800 mg by mouth every 8 (eight) hours as needed (pain.)., Disp: , Rfl:    mesalamine (PENTASA) 250 MG CR capsule, Take 250-500 mg by mouth 4 (four) times daily as needed (crohn's disease flare ups)., Disp: , Rfl:    Multiple Vitamins-Minerals (ICAPS AREDS 2 PO), Take 1 tablet by mouth daily., Disp: , Rfl:    potassium chloride  (MICRO-K ) 10 MEQ CR capsule, Take 1 capsule (10 mEq total) by mouth daily as needed (when taking furosemide )., Disp: 30 capsule, Rfl: 3   prednisoLONE acetate (PRED FORTE) 1 % ophthalmic suspension, Place 1 drop into both eyes 2 (two) times daily as needed (Uveitis)., Disp: , Rfl:    Probiotic Product (PROBIOTIC PO), Take 1 capsule by mouth 2 (two) times a week.  As needed, Disp: , Rfl:    SYRINGE-NEEDLE, DISP, 3 ML (LUER LOCK SAFETY SYRINGES) 25G X 1 3 ML MISC, USE AS DIRECTED, Disp: 100 each, Rfl: 5   traMADol  (ULTRAM ) 50 MG tablet, Take 1 tablet (50 mg total) by mouth every 6 (six) hours as needed., Disp: 20 tablet, Rfl: 0   DULoxetine  (CYMBALTA ) 20 MG capsule, Take 1 capsule (20 mg total) by mouth daily. (Patient not taking: Reported on 07/10/2024), Disp: 30 capsule, Rfl: 5   Glycerin-Polysorbate 80 (REFRESH DRY EYE THERAPY OP), Place 1 drop into both eyes daily. (Patient not taking: Reported on  07/10/2024), Disp: , Rfl:    Treprostinil  (TYVASO  REFILL) 0.6 MG/ML SOLN, Inhale 54 mcg into the lungs in the morning, at noon, in the evening, and at bedtime. (Patient not taking: Reported on 07/10/2024), Disp: 87 mL, Rfl: 11   triamcinolone  cream (KENALOG ) 0.1 %, Apply 1 Application topically 2 (two) times daily. (Patient not taking: Reported on 07/10/2024), Disp: 30 g, Rfl: 0  Past Medical History: Past Medical History:  Diagnosis Date   Abnormal nuclear stress test    a. 07/2017: cath with no CAD   Anxiety    Aortic atherosclerosis (HCC)    Arthritis    Chronic diastolic CHF (congestive heart failure) (HCC)    Coronary artery calcification seen on CT scan    Crohn disease (HCC)    DVT (deep venous thrombosis) (HCC)    Dyspnea    Grade I diastolic dysfunction    Hyperlipidemia    ILD (interstitial lung disease) (HCC)    OSA on CPAP    CPAP    AND O2   Pernicious anemia    Polycythemia vera (HCC)    Pulmonary embolism (HCC) 2010   s/p hip surgery , reports this was never truly confirmed    Pulmonary hypertension (HCC)    Sleep disorder 01/10/2020   Uses klonopin  for sleep since 2014; sleep walking. And anxiety    Tobacco Use: Social History   Tobacco Use  Smoking Status Former   Current packs/day: 0.00   Average packs/day: 1 pack/day for 45.0 years (45.0 ttl pk-yrs)   Types: Cigarettes   Start date: 12/27/1963   Quit date: 12/26/2008   Years since quitting: 15.5  Smokeless Tobacco Never    Labs: Review Flowsheet  More data exists      Latest Ref Rng & Units 01/31/2022 11/25/2022 11/14/2023 12/04/2023 03/04/2024  Labs for ITP Cardiac and Pulmonary Rehab  Cholestrol 0 - 200 mg/dL - 809  - 796  -  LDL (calc) 0 - 99 mg/dL - 882  - 868  -  HDL-C >39.00 mg/dL - 44.29  - 43.69  -  Trlycerides 0.0 - 149.0 mg/dL - 14.9  - 21.9  -  Hemoglobin A1c 4.0 - 5.6 % 6.2  6.8  6.8  - 6.1     Capillary Blood Glucose: Lab Results  Component Value Date   GLUCAP 123 (H) 01/28/2021      Pulmonary Assessment Scores:  Pulmonary Assessment Scores     Row Name 07/10/24 1356         ADL UCSD   ADL Phase Entry     SOB Score total 38       CAT Score   CAT Score 14       mMRC Score   mMRC Score 4       UCSD: Self-administered rating of dyspnea associated with activities of daily living (ADLs) 6-point scale (0 =  not at all to 5 = maximal or unable to do because of breathlessness)  Scoring Scores range from 0 to 120.  Minimally important difference is 5 units  CAT: CAT can identify the health impairment of COPD patients and is better correlated with disease progression.  CAT has a scoring range of zero to 40. The CAT score is classified into four groups of low (less than 10), medium (10 - 20), high (21-30) and very high (31-40) based on the impact level of disease on health status. A CAT score over 10 suggests significant symptoms.  A worsening CAT score could be explained by an exacerbation, poor medication adherence, poor inhaler technique, or progression of COPD or comorbid conditions.  CAT MCID is 2 points  mMRC: mMRC (Modified Medical Research Council) Dyspnea Scale is used to assess the degree of baseline functional disability in patients of respiratory disease due to dyspnea. No minimal important difference is established. A decrease in score of 1 point or greater is considered a positive change.   Pulmonary Function Assessment:  Pulmonary Function Assessment - 07/10/24 1345       Breath   Bilateral Breath Sounds Decreased    Shortness of Breath Yes;Fear of Shortness of Breath;Limiting activity          Exercise Target Goals: Exercise Program Goal: Individual exercise prescription set using results from initial 6 min walk test and THRR while considering  patient's activity barriers and safety.   Exercise Prescription Goal: Initial exercise prescription builds to 30-45 minutes a day of aerobic activity, 2-3 days per week.  Home exercise  guidelines will be given to patient during program as part of exercise prescription that the participant will acknowledge.  Activity Barriers & Risk Stratification:  Activity Barriers & Cardiac Risk Stratification - 07/10/24 1343       Activity Barriers & Cardiac Risk Stratification   Activity Barriers Deconditioning;Muscular Weakness;Shortness of Breath;Other (comment)    Comments leg and foot pain          6 Minute Walk:  6 Minute Walk     Row Name 07/10/24 1503         6 Minute Walk   Phase Initial     Distance 530 feet     Walk Time 6 minutes     # of Rest Breaks 3  1:34-2:30, 3:20-4:29, 5:46-6     MPH 1     METS 1.18     RPE 17     Perceived Dyspnea  4     VO2 Peak 4.13     Symptoms Yes (comment)     Comments lightheadedness     Resting HR 87 bpm     Resting BP 138/80     Resting Oxygen  Saturation  95 %  2L     Exercise Oxygen  Saturation  during 6 min walk 84 %  3L, ultimately increasing to 8L     Max Ex. HR 137 bpm     Max Ex. BP 218/80     2 Minute Post BP 182/80       Interval HR   1 Minute HR 124     2 Minute HR 111     3 Minute HR 120     4 Minute HR 121     5 Minute HR 126     6 Minute HR 122     2 Minute Post HR 95     Interval Heart Rate? Yes  Interval Oxygen    Interval Oxygen ? Yes     Baseline Oxygen  Saturation % 95 %  2L     1 Minute Oxygen  Saturation % 88 %  titrated to 3L     1 Minute Liters of Oxygen  3 L     2 Minute Oxygen  Saturation % 91 %  1:34 dropped to 84%, O2 up to 4L     2 Minute Liters of Oxygen  4 L     3 Minute Oxygen  Saturation % 91 %     3 Minute Liters of Oxygen  4 L     4 Minute Oxygen  Saturation % 93 %  3:20 O2 dropped to 88%, O2 titrated to 6L     4 Minute Liters of Oxygen  6 L     5 Minute Oxygen  Saturation % 91 %     5 Minute Liters of Oxygen  6 L     6 Minute Oxygen  Saturation % 88 %  O2 titrated to 8L     6 Minute Liters of Oxygen  8 L     2 Minute Post Oxygen  Saturation % 98 %     2 Minute Post Liters of Oxygen   6 L        Oxygen  Initial Assessment:  Oxygen  Initial Assessment - 07/10/24 1343       Home Oxygen    Home Oxygen  Device Home Concentrator;E-Tanks    Sleep Oxygen  Prescription Continuous    Liters per minute 2    Home Exercise Oxygen  Prescription Continuous    Liters per minute 5   pulsed   Home Resting Oxygen  Prescription Continuous    Liters per minute 3    Compliance with Home Oxygen  Use Yes      Initial 6 min Walk   Oxygen  Used Continuous    Liters per minute 8      Program Oxygen  Prescription   Program Oxygen  Prescription Continuous    Liters per minute 8      Intervention   Short Term Goals To learn and exhibit compliance with exercise, home and travel O2 prescription;To learn and understand importance of maintaining oxygen  saturations>88%;To learn and demonstrate proper pursed lip breathing techniques or other breathing techniques. ;To learn and understand importance of monitoring SPO2 with pulse oximeter and demonstrate accurate use of the pulse oximeter.;To learn and demonstrate proper use of respiratory medications    Long  Term Goals Exhibits compliance with exercise, home  and travel O2 prescription;Maintenance of O2 saturations>88%;Compliance with respiratory medication;Demonstrates proper use of MDI's;Exhibits proper breathing techniques, such as pursed lip breathing or other method taught during program session;Verbalizes importance of monitoring SPO2 with pulse oximeter and return demonstration          Oxygen  Re-Evaluation:   Oxygen  Discharge (Final Oxygen  Re-Evaluation):   Initial Exercise Prescription:   Perform Capillary Blood Glucose checks as needed.  Exercise Prescription Changes:   Exercise Comments:   Exercise Goals and Review:   Exercise Goals     Row Name 07/10/24 1343             Exercise Goals   Increase Physical Activity Yes       Intervention Provide advice, education, support and counseling about physical activity/exercise  needs.;Develop an individualized exercise prescription for aerobic and resistive training based on initial evaluation findings, risk stratification, comorbidities and participant's personal goals.       Expected Outcomes Short Term: Attend rehab on a regular basis to increase amount of physical activity.;Long Term: Exercising regularly at least 3-5  days a week.;Long Term: Add in home exercise to make exercise part of routine and to increase amount of physical activity.       Increase Strength and Stamina Yes       Intervention Provide advice, education, support and counseling about physical activity/exercise needs.;Develop an individualized exercise prescription for aerobic and resistive training based on initial evaluation findings, risk stratification, comorbidities and participant's personal goals.       Expected Outcomes Short Term: Increase workloads from initial exercise prescription for resistance, speed, and METs.;Short Term: Perform resistance training exercises routinely during rehab and add in resistance training at home;Long Term: Improve cardiorespiratory fitness, muscular endurance and strength as measured by increased METs and functional capacity ( )       Able to understand and use rate of perceived exertion (RPE) scale Yes       Intervention Provide education and explanation on how to use RPE scale       Expected Outcomes Long Term:  Able to use RPE to guide intensity level when exercising independently;Short Term: Able to use RPE daily in rehab to express subjective intensity level       Able to understand and use Dyspnea scale Yes       Intervention Provide education and explanation on how to use Dyspnea scale       Expected Outcomes Short Term: Able to use Dyspnea scale daily in rehab to express subjective sense of shortness of breath during exertion;Long Term: Able to use Dyspnea scale to guide intensity level when exercising independently       Knowledge and understanding of  Target Heart Rate Range (THRR) Yes       Intervention Provide education and explanation of THRR including how the numbers were predicted and where they are located for reference       Expected Outcomes Short Term: Able to state/look up THRR;Short Term: Able to use daily as guideline for intensity in rehab;Long Term: Able to use THRR to govern intensity when exercising independently       Understanding of Exercise Prescription Yes       Intervention Provide education, explanation, and written materials on patient's individual exercise prescription       Expected Outcomes Short Term: Able to explain program exercise prescription;Long Term: Able to explain home exercise prescription to exercise independently          Exercise Goals Re-Evaluation :   Discharge Exercise Prescription (Final Exercise Prescription Changes):   Nutrition:  Target Goals: Understanding of nutrition guidelines, daily intake of sodium 1500mg , cholesterol 200mg , calories 30% from fat and 7% or less from saturated fats, daily to have 5 or more servings of fruits and vegetables.  Biometrics:  Pre Biometrics - 07/10/24 1326       Pre Biometrics   Grip Strength 28 kg   old dynomometer          Nutrition Therapy Plan and Nutrition Goals:   Nutrition Assessments:  MEDIFICTS Score Key: >=70 Need to make dietary changes  40-70 Heart Healthy Diet <= 40 Therapeutic Level Cholesterol Diet  Flowsheet Row PULMONARY REHAB OTHER RESPIRATORY from 04/08/2021 in [facility] for Heart, Vascular, & Lung Health  Picture Your Plate Total Score on Discharge 73   Picture Your Plate Scores: <59 Unhealthy dietary pattern with much room for improvement. 41-50 Dietary pattern unlikely to meet recommendations for good health and room for improvement. 51-60 More healthful dietary pattern, with some room for improvement.  >60 Healthy dietary  pattern, although there may be some specific behaviors that could  be improved.    Nutrition Goals Re-Evaluation:   Nutrition Goals Discharge (Final Nutrition Goals Re-Evaluation):   Psychosocial: Target Goals: Acknowledge presence or absence of significant depression and/or stress, maximize coping skills, provide positive support system. Participant is able to verbalize types and ability to use techniques and skills needed for reducing stress and depression.  Initial Review & Psychosocial Screening:  Initial Psych Review & Screening - 07/10/24 1345       Initial Review   Current issues with Current Anxiety/Panic;History of Depression;Current Psychotropic Meds      Family Dynamics   Good Support System? Yes      Barriers   Psychosocial barriers to participate in program There are no identifiable barriers or psychosocial needs.      Screening Interventions   Interventions Provide feedback about the scores to participant    Expected Outcomes Short Term goal: Utilizing psychosocial counselor, staff and physician to assist with identification of specific Stressors or current issues interfering with healing process. Setting desired goal for each stressor or current issue identified.;Long Term Goal: Stressors or current issues are controlled or eliminated.;Short Term goal: Identification and review with participant of any Quality of Life or Depression concerns found by scoring the questionnaire.;Long Term goal: The participant improves quality of Life and PHQ9 Scores as seen by post scores and/or verbalization of changes          Quality of Life Scores:  Scores of 19 and below usually indicate a poorer quality of life in these areas.  A difference of  2-3 points is a clinically meaningful difference.  A difference of 2-3 points in the total score of the Quality of Life Index has been associated with significant improvement in overall quality of life, self-image, physical symptoms, and general health in studies assessing change in quality of  life.  PHQ-9: Review Flowsheet  More data exists      07/10/2024 05/29/2024 03/04/2024 12/04/2023 11/25/2022  Depression screen PHQ 2/9  Decreased Interest 0 0 0 0 0  Down, Depressed, Hopeless 0 0 0 0 0  PHQ - 2 Score 0 0 0 0 0  Altered sleeping 1 - - - -  Tired, decreased energy 1 - - - -  Change in appetite 0 - - - -  Feeling bad or failure about yourself  0 - - - -  Trouble concentrating 1 - - - -  Moving slowly or fidgety/restless 0 - - - -  Suicidal thoughts 0 - - - -  PHQ-9 Score 3 - - - -  Difficult doing work/chores Not difficult at all - - - -   Interpretation of Total Score  Total Score Depression Severity:  1-4 = Minimal depression, 5-9 = Mild depression, 10-14 = Moderate depression, 15-19 = Moderately severe depression, 20-27 = Severe depression   Psychosocial Evaluation and Intervention:  Psychosocial Evaluation - 07/10/24 1346       Psychosocial Evaluation & Interventions   Interventions Encouraged to exercise with the program and follow exercise prescription    Comments Jorene denies any psy/soc barriers or conerns at this time. She feels like her mental health is stable and is compliant with taking her psychotropic meds    Expected Outcomes For Nafisah to participate in PR without any psy/soc barriers or concerns    Continue Psychosocial Services  No Follow up required          Psychosocial Re-Evaluation:   Psychosocial  Discharge (Final Psychosocial Re-Evaluation):   Education: Education Goals: Education classes will be provided on a weekly basis, covering required topics. Participant will state understanding/return demonstration of topics presented.  Learning Barriers/Preferences:  Learning Barriers/Preferences - 07/10/24 1348       Learning Barriers/Preferences   Learning Barriers None    Learning Preferences None          Education Topics: Know Your Numbers Group instruction that is supported by a PowerPoint presentation. Instructor discusses  importance of knowing and understanding resting, exercise, and post-exercise oxygen  saturation, heart rate, and blood pressure. Oxygen  saturation, heart rate, blood pressure, rating of perceived exertion, and dyspnea are reviewed along with a normal range for these values.    Exercise for the Pulmonary Patient Group instruction that is supported by a PowerPoint presentation. Instructor discusses benefits of exercise, core components of exercise, frequency, duration, and intensity of an exercise routine, importance of utilizing pulse oximetry during exercise, safety while exercising, and options of places to exercise outside of rehab.    MET Level  Group instruction provided by PowerPoint, verbal discussion, and written material to support subject matter. Instructor reviews what METs are and how to increase METs.    Pulmonary Medications Verbally interactive group education provided by instructor with focus on inhaled medications and proper administration.   Anatomy and Physiology of the Respiratory System Group instruction provided by PowerPoint, verbal discussion, and written material to support subject matter. Instructor reviews respiratory cycle and anatomical components of the respiratory system and their functions. Instructor also reviews differences in obstructive and restrictive respiratory diseases with examples of each.  Flowsheet Row PULMONARY REHAB OTHER RESPIRATORY from 04/15/2021 in [facility] for Heart, Vascular, & Lung Health  Date 03/04/21  Educator Handout    Oxygen  Safety Group instruction provided by PowerPoint, verbal discussion, and written material to support subject matter. There is an overview of "What is Oxygen " and "Why do we need it".  Instructor also reviews how to create a safe environment for oxygen  use, the importance of using oxygen  as prescribed, and the risks of noncompliance. There is a brief discussion on traveling with oxygen  and  resources the patient may utilize. Flowsheet Row PULMONARY REHAB OTHER RESPIRATORY from 04/15/2021 in [facility] for Heart, Vascular, & Lung Health  Date 02/25/21  Educator Handout    Oxygen  Use Group instruction provided by PowerPoint, verbal discussion, and written material to discuss how supplemental oxygen  is prescribed and different types of oxygen  supply systems. Resources for more information are provided.    Breathing Techniques Group instruction that is supported by demonstration and informational handouts. Instructor discusses the benefits of pursed lip and diaphragmatic breathing and detailed demonstration on how to perform both.     Risk Factor Reduction Group instruction that is supported by a PowerPoint presentation. Instructor discusses the definition of a risk factor, different risk factors for pulmonary disease, and how the heart and lungs work together.   Pulmonary Diseases Group instruction provided by PowerPoint, verbal discussion, and written material to support subject matter. Instructor gives an overview of the different type of pulmonary diseases. There is also a discussion on risk factors and symptoms as well as ways to manage the diseases.   Stress and Energy Conservation Group instruction provided by PowerPoint, verbal discussion, and written material to support subject matter. Instructor gives an overview of stress and the impact it can have on the body. Instructor also reviews ways to reduce stress. There is also  a discussion on energy conservation and ways to conserve energy throughout the day.   Warning Signs and Symptoms Group instruction provided by PowerPoint, verbal discussion, and written material to support subject matter. Instructor reviews warning signs and symptoms of stroke, heart attack, cold and flu. Instructor also reviews ways to prevent the spread of infection.   Other Education Group or individual verbal, written,  or video instructions that support the educational goals of the pulmonary rehab program. Flowsheet Row PULMONARY REHAB OTHER RESPIRATORY from 04/15/2021 in [facility] for Heart, Vascular, & Lung Health  Date 03/25/21  [Beat a Sedentary Lifestyle]  Educator handout     Knowledge Questionnaire Score:  Knowledge Questionnaire Score - 07/10/24 1520       Knowledge Questionnaire Score   Pre Score 17/18          Core Components/Risk Factors/Patient Goals at Admission:  Personal Goals and Risk Factors at Admission - 07/10/24 1349       Core Components/Risk Factors/Patient Goals on Admission    Weight Management Yes;Obesity;Weight Loss    Intervention Weight Management: Develop a combined nutrition and exercise program designed to reach desired caloric intake, while maintaining appropriate intake of nutrient and fiber, sodium and fats, and appropriate energy expenditure required for the weight goal.;Weight Management: Provide education and appropriate resources to help participant work on and attain dietary goals.;Weight Management/Obesity: Establish reasonable short term and long term weight goals.;Obesity: Provide education and appropriate resources to help participant work on and attain dietary goals.    Admit Weight 274 lb 11.1 oz (124.6 kg)    Expected Outcomes Short Term: Continue to assess and modify interventions until short term weight is achieved;Long Term: Adherence to nutrition and physical activity/exercise program aimed toward attainment of established weight goal;Weight Maintenance: Understanding of the daily nutrition guidelines, which includes 25-35% calories from fat, 7% or less cal from saturated fats, less than 200mg  cholesterol, less than 1.5gm of sodium, & 5 or more servings of fruits and vegetables daily;Weight Loss: Understanding of general recommendations for a balanced deficit meal plan, which promotes 1-2 lb weight loss per week and includes a  negative energy balance of [phone] kcal/d;Understanding recommendations for meals to include 15-35% energy as protein, 25-35% energy from fat, 35-60% energy from carbohydrates, less than 200mg  of dietary cholesterol, 20-35 gm of total fiber daily;Understanding of distribution of calorie intake throughout the day with the consumption of 4-5 meals/snacks    Improve shortness of breath with ADL's Yes    Intervention Provide education, individualized exercise plan and daily activity instruction to help decrease symptoms of SOB with activities of daily living.    Expected Outcomes Short Term: Improve cardiorespiratory fitness to achieve a reduction of symptoms when performing ADLs;Long Term: Be able to perform more ADLs without symptoms or delay the onset of symptoms          Core Components/Risk Factors/Patient Goals Review:    Core Components/Risk Factors/Patient Goals at Discharge (Final Review):    ITP Comments:   Comments: Dr. Slater Staff is Medical Director for Pulmonary Rehab at [facility].

## 2024-07-10 NOTE — Progress Notes (Signed)
 Patty [name] 73 y.o. female  Pulmonary Rehab Orientation Note  This patient who was referred to Pulmonary Rehab by Dr. Geronimo with the diagnosis of ILD and chronic respiratory failuyre with hypoxia arrived today in Cardiac and Pulmonary Rehab. She  arrived ambulatory with normal gait. She  does carry portable oxygen . [employer] is the provider for their DME. Per patient, Keierra uses oxygen  continuously. Color good, skin warm and dry. Patient is oriented to time and place. Patient's medical history, psychosocial health, and medications reviewed.   Psychosocial assessment reveals patient lives with alone. Parneet is currently retired. Patient hobbies include trying to downsize and clean out house. Sheldon is also working on genealogy, Engineer, drilling and social media. Patient reports her stress level is low. Areas of stress/anxiety include health. Patient does not exhibit signs of depression. Signs of depression include helplessness and hopelessness and fatigue. PHQ2/9 score 0/3. Dlynn shows good  coping skills with positive outlook on life. Offered emotional support and reassurance. Will continue to monitor and evaluate progress toward psychosocial goal(s) of decreased health related stress.   Physical assessment reveals: Obese appearing, A&Ox4, NAD Eyes/Ears: + macular degeration Lungs: + diminished with no wheezes, rales, rhonchi, denies chronic cough, + dyspnea on exertion, wearing 5L pulsed O2 Heart: Regular rate rhythm, no murmurs, no rubs, no clicks Gastrointestinal: abdomin soft, + bowel sounds in all 4 quads, denies recent weight gain or loss, endorses normal Bms, + crohn's, no recent flare ups Genitourinary: WNL, pt denies s/s Extremities:  +2 pulses, grip strength equal, strong, + edema +2 BLE, no cyanosis, no clubbing Integumentary: pt denies any rashes, open or non healing wounds Psy/Soc: Pt denies any needs or resources at this time Assistive devices: Pt carriers portable oxygen   tank, has home concentrator   Allaina reports she does take medications as prescribed. Patient states she follows a regular  and high protein. The patient has been trying to lose weight through a healthy diet and exercise program. Pt's weight will be monitored closely.   Demonstration and practice of PLB using pulse oximeter. Damia able to return demonstration satisfactorily. Safety and hand hygiene in the exercise area reviewed with patient. Floretta voices understanding of the information reviewed. Department expectations discussed with patient and achievable goals were set. The patient shows enthusiasm about attending the program and we look forward to working with Patty. Aslin completed a 6 min walk test today and is scheduled to begin exercise on 07/16/24 at 1315.   1305-1430 Ronal Levin, RN, BSN

## 2024-07-16 ENCOUNTER — Inpatient Hospital Stay (HOSPITAL_COMMUNITY)
Admission: RE | Admit: 2024-07-16 | Discharge: 2024-07-16 | Discharge status: Home / Self Care | Source: Ambulatory Visit | Attending: Internal Medicine

## 2024-07-16 VITALS — Wt 271.8 lb

## 2024-07-16 DIAGNOSIS — J849 Interstitial pulmonary disease, unspecified: Secondary | ICD-10-CM

## 2024-07-16 DIAGNOSIS — J9611 Chronic respiratory failure with hypoxia: Secondary | ICD-10-CM

## 2024-07-16 NOTE — Progress Notes (Signed)
 Daily Session Note  Patient Details  Name: Patty [name] MRN: [id number] Date of Birth: 1951/10/21 Referring Provider:   Conrad Ports Pulmonary Rehab Walk Test from 02/15/2021 in [facility]  Referring Provider Dr. Geronimo    Encounter Date: 07/16/2024  Check In:  Session Check In - 07/16/24 1505       Check-In   Supervising physician immediately available to respond to emergencies [facility] MD immediately available    Physician(s) Rosaline Skains, NP    Location [facility]    Staff Present Ronal Levin, RN, BSN;Samantha Belarus, RD, Stafford Moats, MS, ACSM-CEP, Exercise Physiologist;Casey Claudene, RT    Virtual Visit No    Medication changes reported     No    Fall or balance concerns reported    No    Tobacco Cessation No Change    Warm-up and Cool-down Performed as group-led instruction   only   Resistance Training Performed Yes    VAD Patient? No    PAD/SET Patient? No      Pain Assessment   Currently in Pain? No/denies    Multiple Pain Sites No          Capillary Blood Glucose: No results found for this or any previous visit (from the past 24 hours).   Exercise Prescription Changes - 07/16/24 1500       Response to Exercise   Blood Pressure (Admit) 128/72    Blood Pressure (Exercise) 186/90    Blood Pressure (Exit) 128/62    Heart Rate (Admit) 104 bpm    Heart Rate (Exercise) 151 bpm    Heart Rate (Exit) 114 bpm    Oxygen  Saturation (Admit) 96 %   8L   Oxygen  Saturation (Exercise) 88 %   8L   Oxygen  Saturation (Exit) 93 %   RA   Rating of Perceived Exertion (Exercise) 14    Perceived Dyspnea (Exercise) 3    Duration Continue with 30 min of aerobic exercise without signs/symptoms of physical distress.    Intensity Other (comment)   will contact MD for an increase in THR     Progression   Progression Continue to progress workloads to maintain intensity without signs/symptoms  of physical distress.      Resistance Training   Weight red bands    Reps 10-15    Time 10 Minutes      Oxygen    Oxygen  Continuous    Liters 8-10      NuStep   Level 1    SPM 103    Minutes 15    METs 2.1      Track   Laps 5    Minutes 15    METs 1.62      Oxygen    Maintain Oxygen  Saturation 88% or higher          Social History   Tobacco Use  Smoking Status Former   Current packs/day: 0.00   Average packs/day: 1 pack/day for 45.0 years (45.0 ttl pk-yrs)   Types: Cigarettes   Start date: 12/27/1963   Quit date: 12/26/2008   Years since quitting: 15.5  Smokeless Tobacco Never    Goals Met:  Exercise tolerated well Queuing for purse lip breathing No report of concerns or symptoms today Strength training completed today  Goals Unmet:  Not Applicable  Comments: Service time is from 1311 to 1441    Dr. Slater Staff is Medical Director for Pulmonary Rehab  at [facility].

## 2024-07-18 ENCOUNTER — Encounter (HOSPITAL_COMMUNITY)
Admission: RE | Admit: 2024-07-18 | Discharge: 2024-07-18 | Disposition: A | Discharge status: Home / Self Care | Source: Ambulatory Visit | Attending: Internal Medicine | Admitting: Internal Medicine

## 2024-07-18 DIAGNOSIS — J9611 Chronic respiratory failure with hypoxia: Secondary | ICD-10-CM

## 2024-07-18 DIAGNOSIS — J849 Interstitial pulmonary disease, unspecified: Secondary | ICD-10-CM

## 2024-07-18 NOTE — Progress Notes (Signed)
 Daily Session Note  Patient Details  Name: Patty [name] MRN: [id number] Date of Birth: 1951-07-31 Referring Provider:   Conrad Ports Pulmonary Rehab Walk Test from 02/15/2021 in [facility]  Referring Provider Dr. Geronimo    Encounter Date: 07/18/2024  Check In:  Session Check In - 07/18/24 1500       Check-In   Supervising physician immediately available to respond to emergencies [facility] MD immediately available    Physician(s) Lum Louis, NP    Location [facility]    Staff Present Ronal Levin, RN, Maud Moats, MS, ACSM-CEP, Exercise Physiologist;Antwuan Eckley Claudene, RT    Virtual Visit No    Medication changes reported     No    Fall or balance concerns reported    No    Tobacco Cessation No Change    Warm-up and Cool-down Performed as group-led instruction   only   Resistance Training Performed Yes    VAD Patient? No    PAD/SET Patient? No      Pain Assessment   Currently in Pain? No/denies    Multiple Pain Sites No          Capillary Blood Glucose: No results found for this or any previous visit (from the past 24 hours).    Social History   Tobacco Use  Smoking Status Former   Current packs/day: 0.00   Average packs/day: 1 pack/day for 45.0 years (45.0 ttl pk-yrs)   Types: Cigarettes   Start date: 12/27/1963   Quit date: 12/26/2008   Years since quitting: 15.5  Smokeless Tobacco Never    Goals Met:  Proper associated with RPD/PD & O2 Sat Independence with exercise equipment Exercise tolerated well No report of concerns or symptoms today Strength training completed today  Goals Unmet:  Not Applicable  Comments: Service time is from 1310 to 1444.    Dr. Slater Staff is Medical Director for Pulmonary Rehab at [facility].

## 2024-07-23 ENCOUNTER — Encounter (HOSPITAL_COMMUNITY)
Admission: RE | Admit: 2024-07-23 | Discharge: 2024-07-23 | Disposition: A | Discharge status: Home / Self Care | Source: Ambulatory Visit | Attending: Internal Medicine | Admitting: Internal Medicine

## 2024-07-23 ENCOUNTER — Telehealth: Payer: Self-pay | Admitting: Internal Medicine

## 2024-07-23 DIAGNOSIS — J9611 Chronic respiratory failure with hypoxia: Secondary | ICD-10-CM

## 2024-07-23 DIAGNOSIS — J849 Interstitial pulmonary disease, unspecified: Secondary | ICD-10-CM

## 2024-07-23 NOTE — Telephone Encounter (Signed)
 Copied from CRM [id number]. Topic: Clinical - Order For Equipment >> Jul 23, 2024  4:13 PM Celestine FALCON wrote: Reason for CRM: Ronal RN from [facility] is calling regarding the previous request for orders on the pt's behalf for approval for increase in target heart rate order to 150, new home concentrator order and regulator for oxygen  that goes up to 10 Ls. I attempted to contact CAL to request someone to speak to her, but was denied as there was nobody available at this time. Ronal made the first call on 7/24, second on 7/25, and today 7/29 was her third contact. She would like a call back to discus the requests.   Pulm rehab phone number is [phone].

## 2024-07-23 NOTE — Progress Notes (Signed)
 Daily Session Note  Patient Details  Name: Patty [name] MRN: [id number] Date of Birth: Nov 16, 1951 Referring Provider:   Conrad Ports Pulmonary Rehab Walk Test from 02/15/2021 in [facility]  Referring Provider Dr. Geronimo    Encounter Date: 07/23/2024  Check In:  Session Check In - 07/23/24 1333       Check-In   Supervising physician immediately available to respond to emergencies [facility] MD immediately available    Physician(s) Barnie Hila, NP    Location [facility]    Staff Present Ronal Levin, RN, Maud Moats, MS, ACSM-CEP, Exercise Physiologist;Liberta Gimpel Claudene Idelia Aris BS, ACSM-CEP, Exercise Physiologist    Virtual Visit No    Medication changes reported     No    Fall or balance concerns reported    No    Tobacco Cessation No Change    Warm-up and Cool-down Performed as group-led instruction   only   Resistance Training Performed Yes    VAD Patient? No    PAD/SET Patient? No      Pain Assessment   Currently in Pain? No/denies    Multiple Pain Sites No          Capillary Blood Glucose: No results found for this or any previous visit (from the past 24 hours).    Social History   Tobacco Use  Smoking Status Former   Current packs/day: 0.00   Average packs/day: 1 pack/day for 45.0 years (45.0 ttl pk-yrs)   Types: Cigarettes   Start date: 12/27/1963   Quit date: 12/26/2008   Years since quitting: 15.5  Smokeless Tobacco Never    Goals Met:  Proper associated with RPD/PD & O2 Sat Independence with exercise equipment Exercise tolerated well No report of concerns or symptoms today Strength training completed today  Goals Unmet:  Not Applicable  Comments: Service time is from 1313 to 1446.    Dr. Slater Staff is Medical Director for Pulmonary Rehab at [facility]

## 2024-07-24 ENCOUNTER — Ambulatory Visit (INDEPENDENT_AMBULATORY_CARE_PROVIDER_SITE_OTHER): Admitting: Podiatry

## 2024-07-24 ENCOUNTER — Encounter: Payer: Self-pay | Admitting: Internal Medicine

## 2024-07-24 DIAGNOSIS — Q828 Other specified congenital malformations of skin: Secondary | ICD-10-CM | POA: Diagnosis not present

## 2024-07-24 NOTE — Telephone Encounter (Signed)
 I called and spoke with Patty [name] at [facility] rehab  Pt has been using up to 8lpm o2 at rehab  They need the following orders for her    Oxymyzer 10 lpm concentrator Non pulsed regulator  And verbal ok for target heart rate during rehab to be up to 160   MR- please advise if okay with all of the above and I will place orders and call rehab back, thanks!

## 2024-07-24 NOTE — Progress Notes (Signed)
 Subjective:  Patient ID: Patty [name], female    DOB: December 03, 1951,  MRN: [id number]  Chief Complaint  Patient presents with   Nail Problem    Nail trim     73 y.o. female presents with the above complaint.  Patient presents with left submetatarsal 5 porokeratotic lesion with underlying plantarflexed metatarsal.  Patient states pain for touch is progressive gotten worse.  She will get it evaluated she has not seen and was prior to seeing me.  She is not a diabetic.  Pain scale 7 out of 10 hurts with ambulation.   Review of Systems: Negative except as noted in the HPI. Denies N/V/F/Ch.  Past Medical History:  Diagnosis Date   Abnormal nuclear stress test    a. 07/2017: cath with no CAD   Anxiety    Aortic atherosclerosis (HCC)    Arthritis    Chronic diastolic CHF (congestive heart failure) (HCC)    Coronary artery calcification seen on CT scan    Crohn disease (HCC)    DVT (deep venous thrombosis) (HCC)    Dyspnea    Grade I diastolic dysfunction    Hyperlipidemia    ILD (interstitial lung disease) (HCC)    OSA on CPAP    CPAP    AND O2   Pernicious anemia    Polycythemia vera (HCC)    Pulmonary embolism (HCC) 2010   s/p hip surgery , reports this was never truly confirmed    Pulmonary hypertension (HCC)    Sleep disorder 01/10/2020   Uses klonopin  for sleep since 2014; sleep walking. And anxiety    Current Outpatient Medications:    albuterol  (VENTOLIN  HFA) 108 (90 Base) MCG/ACT inhaler, INHALE 2 PUFFS INTO THE LUNGS EVERY 6 HOURS AS NEEDED FOR WHEEZING OR SHORTNESS OF BREATH, Disp: 6.7 g, Rfl: 6   amoxicillin  (AMOXIL ) 500 MG capsule, Take 4 capsules (2,000 mg total) by mouth once as needed for up to 1 dose. Before dental procedures, Disp: 20 capsule, Rfl: 1   aspirin  EC 81 MG tablet, Take 81 mg by mouth daily., Disp: , Rfl:    Calcium Carbonate-Vit D-Min (CALCIUM 1200) 1200-1000 MG-UNIT CHEW, Chew 1 tablet by mouth daily., Disp: , Rfl:    CANNABIDIOL PO, Take 1 Dose  by mouth 2 (two) times daily as needed (anxiety/sleep.). CBD Oil, Disp: , Rfl:    cetirizine  (ZYRTEC ) 10 MG tablet, Take 10 mg by mouth daily as needed (sinus/allergies.)., Disp: , Rfl:    cetirizine -pseudoephedrine (ZYRTEC -D) 5-120 MG tablet, Take 1 tablet by mouth daily as needed (sinus headaches.)., Disp: , Rfl:    clonazePAM  (KLONOPIN ) 0.5 MG tablet, Take 1 tablet (0.5 mg total) by mouth daily as needed for anxiety., Disp: 90 tablet, Rfl: 1   cyanocobalamin  (VITAMIN B12) 1000 MCG/ML injection, Inject 1 mL (1,000 mcg total) into the muscle every 30 (thirty) days., Disp: 10 mL, Rfl: 1   dorzolamide (TRUSOPT) 2 % ophthalmic solution, Place 1 drop into the right eye 2 (two) times daily., Disp: , Rfl:    DULoxetine  (CYMBALTA ) 20 MG capsule, Take 1 capsule (20 mg total) by mouth daily. (Patient not taking: Reported on 07/10/2024), Disp: 30 capsule, Rfl: 5   FLUoxetine  (PROZAC ) 20 MG capsule, Take 1 capsule (20 mg total) by mouth daily., Disp: 90 capsule, Rfl: 3   fluticasone (FLONASE) 50 MCG/ACT nasal spray, Place 1-2 sprays into both nostrils daily as needed for allergies or rhinitis., Disp: , Rfl:    furosemide  (LASIX ) 20 MG tablet, Take 1  tablet (20 mg total) by mouth as needed for fluid., Disp: 30 tablet, Rfl: 11   gabapentin  (NEURONTIN ) 100 MG capsule, Take 3 capsules (300 mg total) by mouth at bedtime., Disp: 90 capsule, Rfl: 11   Glycerin-Polysorbate 80 (REFRESH DRY EYE THERAPY OP), Place 1 drop into both eyes daily. (Patient not taking: Reported on 07/10/2024), Disp: , Rfl:    hydrocortisone  (PROCTOZONE -HC) 2.5 % rectal cream, Place 1 application rectally 2 (two) times daily as needed (Crohn's flare)., Disp: 28 g, Rfl: 2   ibuprofen (ADVIL) 200 MG tablet, Take 400-800 mg by mouth every 8 (eight) hours as needed (pain.)., Disp: , Rfl:    mesalamine (PENTASA) 250 MG CR capsule, Take 250-500 mg by mouth 4 (four) times daily as needed (crohn's disease flare ups)., Disp: , Rfl:    Multiple  Vitamins-Minerals (ICAPS AREDS 2 PO), Take 1 tablet by mouth daily., Disp: , Rfl:    potassium chloride  (MICRO-K ) 10 MEQ CR capsule, Take 1 capsule (10 mEq total) by mouth daily as needed (when taking furosemide )., Disp: 30 capsule, Rfl: 3   prednisoLONE acetate (PRED FORTE) 1 % ophthalmic suspension, Place 1 drop into both eyes 2 (two) times daily as needed (Uveitis)., Disp: , Rfl:    Probiotic Product (PROBIOTIC PO), Take 1 capsule by mouth 2 (two) times a week. As needed, Disp: , Rfl:    SYRINGE-NEEDLE, DISP, 3 ML (LUER LOCK SAFETY SYRINGES) 25G X 1 3 ML MISC, USE AS DIRECTED, Disp: 100 each, Rfl: 5   traMADol  (ULTRAM ) 50 MG tablet, Take 1 tablet (50 mg total) by mouth every 6 (six) hours as needed., Disp: 20 tablet, Rfl: 0   Treprostinil  (TYVASO  REFILL) 0.6 MG/ML SOLN, Inhale 54 mcg into the lungs in the morning, at noon, in the evening, and at bedtime. (Patient not taking: Reported on 07/10/2024), Disp: 87 mL, Rfl: 11   triamcinolone  cream (KENALOG ) 0.1 %, Apply 1 Application topically 2 (two) times daily. (Patient not taking: Reported on 07/10/2024), Disp: 30 g, Rfl: 0  Social History   Tobacco Use  Smoking Status Former   Current packs/day: 0.00   Average packs/day: 1 pack/day for 45.0 years (45.0 ttl pk-yrs)   Types: Cigarettes   Start date: 12/27/1963   Quit date: 12/26/2008   Years since quitting: 15.5  Smokeless Tobacco Never    Allergies  Allergen Reactions   Tizanidine Hcl Shortness Of Breath   Victoza  [Liraglutide ] Shortness Of Breath   Metformin  And Related Diarrhea    Increased moodiness   Onion Other (See Comments)    sereve diarrhea, stomach pains (Crohn's flares)   Avastin  [Bevacizumab ]    Lasix  [Furosemide ] Itching    Hypersensitivity - pt can take as needed    Pirfenidone  Other (See Comments)    GI side effects   Potassium-Containing Compounds Other (See Comments)    Due to chron's, difficult passing though kidneys not allowing absorption in the body    Prednisone  Other (See Comments)    Altered mood   Sulfa Drugs Cross Reactors Other (See Comments)    feels like bugs crawling on me   Trelegy Ellipta  [Fluticasone-Umeclidin-Vilant] Palpitations   Objective:  There were no vitals filed for this visit. There is no height or weight on file to calculate BMI. Constitutional Well developed. Well nourished.  Vascular Dorsalis pedis pulses palpable bilaterally. Posterior tibial pulses palpable bilaterally. Capillary refill normal to all digits.  No cyanosis or clubbing noted. Pedal hair growth normal.  Neurologic Normal speech. Oriented to  person, place, and time. Epicritic sensation to light touch grossly present bilaterally.  Dermatologic Hyperkeratotic lesion with central nucleated core noted.  Pain on palpation to the lesion.  Submetatarsal 5 lesion plantarflexed metatarsal noted  Orthopedic: Normal joint ROM without pain or crepitus bilaterally. No visible deformities. No bony tenderness.   Radiographs: None Assessment:   1. Porokeratosis     Plan:  Patient was evaluated and treated and all questions answered.  Left submetatarsal 5 porokeratosis -All questions and concerns were discussed with the patient in extensive detail.  I discussed shoe gear modification offloading padding protective in extensive detail she states understanding will obtain them.  Using chisel blade handle lesion was debrided down to healthy striated tissue.  Followed by excision of central nucleated core.  Immediate relief was noted.  No complication noted.  No follow-ups on file.

## 2024-07-25 ENCOUNTER — Encounter (HOSPITAL_COMMUNITY)
Admission: RE | Admit: 2024-07-25 | Discharge: 2024-07-25 | Disposition: A | Discharge status: Home / Self Care | Source: Ambulatory Visit | Attending: Internal Medicine | Admitting: Internal Medicine

## 2024-07-25 DIAGNOSIS — J849 Interstitial pulmonary disease, unspecified: Secondary | ICD-10-CM | POA: Diagnosis not present

## 2024-07-25 DIAGNOSIS — J9611 Chronic respiratory failure with hypoxia: Secondary | ICD-10-CM

## 2024-07-25 NOTE — Progress Notes (Signed)
 Daily Session Note  Patient Details  Name: Patty [name] MRN: [id number] Date of Birth: June 30, 1951 Referring Provider:   Conrad Ports Pulmonary Rehab Walk Test from 02/15/2021 in [facility]  Referring Provider Dr. Geronimo    Encounter Date: 07/25/2024  Check In:  Session Check In - 07/25/24 1334       Check-In   Supervising physician immediately available to respond to emergencies [facility] MD immediately available    Physician(s) Josefa Beauvais, NP    Location [facility]    Staff Present Ronal Levin, RN, Maud Moats, MS, ACSM-CEP, Exercise Physiologist;Casey Claudene Idelia Aris BS, ACSM-CEP, Exercise Physiologist    Virtual Visit No    Medication changes reported     No    Fall or balance concerns reported    No    Tobacco Cessation No Change    Warm-up and Cool-down Performed as group-led instruction   only   Resistance Training Performed Yes    VAD Patient? No    PAD/SET Patient? No      Pain Assessment   Currently in Pain? No/denies    Multiple Pain Sites No          Capillary Blood Glucose: No results found for this or any previous visit (from the past 24 hours).    Social History   Tobacco Use  Smoking Status Former   Current packs/day: 0.00   Average packs/day: 1 pack/day for 45.0 years (45.0 ttl pk-yrs)   Types: Cigarettes   Start date: 12/27/1963   Quit date: 12/26/2008   Years since quitting: 15.5  Smokeless Tobacco Never    Goals Met:  Exercise tolerated well No report of concerns or symptoms today Strength training completed today  Goals Unmet:  Not Applicable  Comments: Service time is from 1313 to 1448    Dr. Slater Staff is Medical Director for Pulmonary Rehab at [facility].

## 2024-07-26 NOTE — Telephone Encounter (Signed)
 Yeah this is finr

## 2024-07-30 ENCOUNTER — Encounter (HOSPITAL_COMMUNITY)
Admission: RE | Admit: 2024-07-30 | Discharge: 2024-07-30 | Disposition: A | Discharge status: Home / Self Care | Source: Ambulatory Visit | Attending: Internal Medicine | Admitting: Internal Medicine

## 2024-07-30 VITALS — Wt 273.1 lb

## 2024-07-30 DIAGNOSIS — J849 Interstitial pulmonary disease, unspecified: Secondary | ICD-10-CM | POA: Diagnosis present

## 2024-07-30 DIAGNOSIS — J9611 Chronic respiratory failure with hypoxia: Secondary | ICD-10-CM | POA: Diagnosis present

## 2024-07-30 NOTE — Telephone Encounter (Signed)
 I placed orders to [employer]   Called rehab to give verbal order for

## 2024-07-30 NOTE — Progress Notes (Signed)
 Daily Session Note  Patient Details  Name: Patty [name] MRN: [id number] Date of Birth: [birth year] Referring Provider:   Conrad Ports [facility] Re[facility] Walk Test from 02/15/2021 in [facility]  Referring Provider Dr. Geronimo    Encounter Date: 07/30/2024  Check In:  Session Check In - 07/30/24 1402       Check-In   Supervising physician immediately available to respond to emergencies [facility] MD immediately available    Physician(s) Josefa Beauvais, NP    Location [facility]    Staff Present Ronal Levin, RN, Maud Moats, MS, ACSM-CEP, Exercise Physiologist;Casey Claudene Idelia Aris BS, ACSM-CEP, Exercise Physiologist    Virtual Visit No    Medication changes reported     No    Fall or balance concerns reported    No    Tobacco Cessation No Change    Warm-up and Cool-down Performed as group-led instruction    Resistance Training Performed Yes    VAD Patient? No    PAD/SET Patient? No      Pain Assessment   Currently in Pain? No/denies    Multiple Pain Sites No          Capillary Blood Glucose: No results found for this or any previous visit (from the past 24 hours).   Exercise Prescription Changes - 07/30/24 1500       Response to Exercise   Blood Pressure (Admit) 120/76    Blood Pressure (Exercise) 184/80    Blood Pressure (Exit) 124/72    Heart Rate (Admit) 89 bpm    Heart Rate (Exercise) 122 bpm    Heart Rate (Exit) 98 bpm    Oxygen  Saturation (Admit) 97 %   6L cont   Oxygen  Saturation (Exercise) 93 %   8L cont   Oxygen  Saturation (Exit) 94 %   6L pulsed O2   Rating of Perceived Exertion (Exercise) 13    Perceived Dyspnea (Exercise) 1    Duration Continue with 30 min of aerobic exercise without signs/symptoms of physical distress.    Intensity THRR New   THR increased to 160     Progression   Progression Continue to progress workloads to maintain intensity without  signs/symptoms of physical distress.      Resistance Training   Weight red bands    Reps 10-15    Time 10 Minutes      Oxygen    Oxygen  Continuous    Liters 8      NuStep   Level 3    SPM 104    Minutes 15    METs 2      Track   Laps 8    Minutes 15    METs 2          Social History   Tobacco Use  Smoking Status Former   Current packs/day: 0.00   Average packs/day: 1 pack/day for 45.0 years (45.0 ttl pk-yrs)   Types: Cigarettes   Start date: 12/27/1963   Quit date: 12/26/2008   Years since quitting: 15.6  Smokeless Tobacco Never    Goals Met:  Exercise tolerated well No report of concerns or symptoms today Strength training completed today  Goals Unmet:  Not Applicable  Comments: Service time is from 1312 to 1438    Dr. Slater Staff is Medical Director for Pulmonary Rehab at [facility].

## 2024-07-31 ENCOUNTER — Telehealth: Payer: Self-pay

## 2024-07-31 NOTE — Telephone Encounter (Signed)
 Copied from CRM [id number]. Topic: Clinical - Order For Equipment >> Jul 23, 2024  4:13 PM Celestine FALCON wrote: Reason for CRM: Ronal RN from [facility] is calling regarding the previous request for orders on the pt's behalf for approval for increase in target heart rate order to 150, new home concentrator order and regulator for oxygen  that goes up to 10 Ls. I attempted to contact CAL to request someone to speak to her, but was denied as there was nobody available at this time. Ronal made the first call on 7/24, second on 7/25, and today 7/29 was her third contact. She would like a call back to discus the requests.   Pulm rehab phone number is [phone]. >> Jul 30, 2024 11:42 AM Luevenia GAILS wrote: Darryle with Pulmonary Rehab returned Leslie's call for verbal orders. Please call back [phone].    Tried to call mary RN of pulm rehab  back she was with patient an order was placed to adapt and I believe we are still waiting on the provider on changes    NFN-

## 2024-07-31 NOTE — Telephone Encounter (Signed)
 Copied from CRM [id number]. Topic: Clinical - Order For Equipment >> Jul 30, 2024 11:42 AM Luevenia GAILS wrote: Darryle with Pulmonary Rehab returned Leslie's call for verbal orders. Please call back [phone].

## 2024-07-31 NOTE — Progress Notes (Signed)
 Pulmonary Individual Treatment Plan  Patient Details  Name: Patty [name] MRN: [id number] Date of Birth: 1951-01-11 Referring Provider:   Conrad Ports Pulmonary Rehab Walk Test from 02/15/2021 in [facility]  Referring Provider Dr. Geronimo    Initial Encounter Date:  Flowsheet Row Pulmonary Rehab Walk Test from 02/15/2021 in [facility], [facility]  Date 02/15/21    Visit Diagnosis: ILD (interstitial lung disease) (HCC)  Chronic respiratory failure with hypoxia (HCC)  Patient's Home Medications on Admission:   Current Outpatient Medications:    albuterol  (VENTOLIN  HFA) 108 (90 Base) MCG/ACT inhaler, INHALE 2 PUFFS INTO THE LUNGS EVERY 6 HOURS AS NEEDED FOR WHEEZING OR SHORTNESS OF BREATH, Disp: 6.7 g, Rfl: 6   amoxicillin  (AMOXIL ) 500 MG capsule, Take 4 capsules (2,000 mg total) by mouth once as needed for up to 1 dose. Before dental procedures, Disp: 20 capsule, Rfl: 1   aspirin  EC 81 MG tablet, Take 81 mg by mouth daily., Disp: , Rfl:    Calcium Carbonate-Vit D-Min (CALCIUM 1200) 1200-1000 MG-UNIT CHEW, Chew 1 tablet by mouth daily., Disp: , Rfl:    CANNABIDIOL PO, Take 1 Dose by mouth 2 (two) times daily as needed (anxiety/sleep.). CBD Oil, Disp: , Rfl:    cetirizine  (ZYRTEC ) 10 MG tablet, Take 10 mg by mouth daily as needed (sinus/allergies.)., Disp: , Rfl:    cetirizine -pseudoephedrine (ZYRTEC -D) 5-120 MG tablet, Take 1 tablet by mouth daily as needed (sinus headaches.)., Disp: , Rfl:    clonazePAM  (KLONOPIN ) 0.5 MG tablet, Take 1 tablet (0.5 mg total) by mouth daily as needed for anxiety., Disp: 90 tablet, Rfl: 1   cyanocobalamin  (VITAMIN B12) 1000 MCG/ML injection, Inject 1 mL (1,000 mcg total) into the muscle every 30 (thirty) days., Disp: 10 mL, Rfl: 1   dorzolamide (TRUSOPT) 2 % ophthalmic solution, Place 1 drop into the right eye 2 (two) times daily., Disp: , Rfl:     DULoxetine  (CYMBALTA ) 20 MG capsule, Take 1 capsule (20 mg total) by mouth daily. (Patient not taking: Reported on 07/10/2024), Disp: 30 capsule, Rfl: 5   FLUoxetine  (PROZAC ) 20 MG capsule, Take 1 capsule (20 mg total) by mouth daily., Disp: 90 capsule, Rfl: 3   fluticasone (FLONASE) 50 MCG/ACT nasal spray, Place 1-2 sprays into both nostrils daily as needed for allergies or rhinitis., Disp: , Rfl:    furosemide  (LASIX ) 20 MG tablet, Take 1 tablet (20 mg total) by mouth as needed for fluid., Disp: 30 tablet, Rfl: 11   gabapentin  (NEURONTIN ) 100 MG capsule, Take 3 capsules (300 mg total) by mouth at bedtime., Disp: 90 capsule, Rfl: 11   Glycerin-Polysorbate 80 (REFRESH DRY EYE THERAPY OP), Place 1 drop into both eyes daily. (Patient not taking: Reported on 07/10/2024), Disp: , Rfl:    hydrocortisone  (PROCTOZONE -HC) 2.5 % rectal cream, Place 1 application rectally 2 (two) times daily as needed (Crohn's flare)., Disp: 28 g, Rfl: 2   ibuprofen (ADVIL) 200 MG tablet, Take 400-800 mg by mouth every 8 (eight) hours as needed (pain.)., Disp: , Rfl:    mesalamine (PENTASA) 250 MG CR capsule, Take 250-500 mg by mouth 4 (four) times daily as needed (crohn's disease flare ups)., Disp: , Rfl:    Multiple Vitamins-Minerals (ICAPS AREDS 2 PO), Take 1 tablet by mouth daily., Disp: , Rfl:    potassium chloride  (MICRO-K ) 10 MEQ CR capsule, Take 1 capsule (10 mEq total) by mouth daily  as needed (when taking furosemide )., Disp: 30 capsule, Rfl: 3   prednisoLONE acetate (PRED FORTE) 1 % ophthalmic suspension, Place 1 drop into both eyes 2 (two) times daily as needed (Uveitis)., Disp: , Rfl:    Probiotic Product (PROBIOTIC PO), Take 1 capsule by mouth 2 (two) times a week. As needed, Disp: , Rfl:    SYRINGE-NEEDLE, DISP, 3 ML (LUER LOCK SAFETY SYRINGES) 25G X 1 3 ML MISC, USE AS DIRECTED, Disp: 100 each, Rfl: 5   traMADol  (ULTRAM ) 50 MG tablet, Take 1 tablet (50 mg total) by mouth every 6 (six) hours as needed., Disp: 20  tablet, Rfl: 0   Treprostinil  (TYVASO  REFILL) 0.6 MG/ML SOLN, Inhale 54 mcg into the lungs in the morning, at noon, in the evening, and at bedtime. (Patient not taking: Reported on 07/10/2024), Disp: 87 mL, Rfl: 11   triamcinolone  cream (KENALOG ) 0.1 %, Apply 1 Application topically 2 (two) times daily. (Patient not taking: Reported on 07/10/2024), Disp: 30 g, Rfl: 0  Past Medical History: Past Medical History:  Diagnosis Date   Abnormal nuclear stress test    a. 07/2017: cath with no CAD   Anxiety    Aortic atherosclerosis (HCC)    Arthritis    Chronic diastolic CHF (congestive heart failure) (HCC)    Coronary artery calcification seen on CT scan    Crohn disease (HCC)    DVT (deep venous thrombosis) (HCC)    Dyspnea    Grade I diastolic dysfunction    Hyperlipidemia    ILD (interstitial lung disease) (HCC)    OSA on CPAP    CPAP    AND O2   Pernicious anemia    Polycythemia vera (HCC)    Pulmonary embolism (HCC) 2010   s/p hip surgery , reports this was never truly confirmed    Pulmonary hypertension (HCC)    Sleep disorder 01/10/2020   Uses klonopin  for sleep since 2014; sleep walking. And anxiety    Tobacco Use: Social History   Tobacco Use  Smoking Status Former   Current packs/day: 0.00   Average packs/day: 1 pack/day for 45.0 years (45.0 ttl pk-yrs)   Types: Cigarettes   Start date: 12/27/1963   Quit date: 12/26/2008   Years since quitting: 15.6  Smokeless Tobacco Never    Labs: Review Flowsheet  More data exists      Latest Ref Rng & Units 01/31/2022 11/25/2022 11/14/2023 12/04/2023 03/04/2024  Labs for ITP Cardiac and Pulmonary Rehab  Cholestrol 0 - 200 mg/dL - 809  - 796  -  LDL (calc) 0 - 99 mg/dL - 882  - 868  -  HDL-C >39.00 mg/dL - 44.29  - 43.69  -  Trlycerides 0.0 - 149.0 mg/dL - 14.9  - 21.9  -  Hemoglobin A1c 4.0 - 5.6 % 6.2  6.8  6.8  - 6.1     Capillary Blood Glucose: Lab Results  Component Value Date   GLUCAP 123 (H) 01/28/2021     Pulmonary  Assessment Scores:  Pulmonary Assessment Scores     Row Name 07/10/24 1356         ADL UCSD   ADL Phase Entry     SOB Score total 38       CAT Score   CAT Score 14       mMRC Score   mMRC Score 4       UCSD: Self-administered rating of dyspnea associated with activities of daily living (ADLs) 6-point scale (0 =  not at all to 5 = maximal or unable to do because of breathlessness)  Scoring Scores range from 0 to 120.  Minimally important difference is 5 units  CAT: CAT can identify the health impairment of COPD patients and is better correlated with disease progression.  CAT has a scoring range of zero to 40. The CAT score is classified into four groups of low (less than 10), medium (10 - 20), high (21-30) and very high (31-40) based on the impact level of disease on health status. A CAT score over 10 suggests significant symptoms.  A worsening CAT score could be explained by an exacerbation, poor medication adherence, poor inhaler technique, or progression of COPD or comorbid conditions.  CAT MCID is 2 points  mMRC: mMRC (Modified Medical Research Council) Dyspnea Scale is used to assess the degree of baseline functional disability in patients of respiratory disease due to dyspnea. No minimal important difference is established. A decrease in score of 1 point or greater is considered a positive change.   Pulmonary Function Assessment:  Pulmonary Function Assessment - 07/10/24 1345       Breath   Bilateral Breath Sounds Decreased    Shortness of Breath Yes;Fear of Shortness of Breath;Limiting activity          Exercise Target Goals: Exercise Program Goal: Individual exercise prescription set using results from initial 6 min walk test and THRR while considering  patient's activity barriers and safety.   Exercise Prescription Goal: Initial exercise prescription builds to 30-45 minutes a day of aerobic activity, 2-3 days per week.  Home exercise guidelines will be  given to patient during program as part of exercise prescription that the participant will acknowledge.  Activity Barriers & Risk Stratification:  Activity Barriers & Cardiac Risk Stratification - 07/10/24 1343       Activity Barriers & Cardiac Risk Stratification   Activity Barriers Deconditioning;Muscular Weakness;Shortness of Breath;Other (comment)    Comments leg and foot pain          6 Minute Walk:  6 Minute Walk     Row Name 07/10/24 1503         6 Minute Walk   Phase Initial     Distance 530 feet     Walk Time 6 minutes     # of Rest Breaks 3  1:34-2:30, 3:20-4:29, 5:46-6     MPH 1     METS 1.18     RPE 17     Perceived Dyspnea  4     VO2 Peak 4.13     Symptoms Yes (comment)     Comments lightheadedness     Resting HR 87 bpm     Resting BP 138/80     Resting Oxygen  Saturation  95 %  2L     Exercise Oxygen  Saturation  during 6 min walk 84 %  3L, ultimately increasing to 8L     Max Ex. HR 137 bpm     Max Ex. BP 218/80     2 Minute Post BP 182/80       Interval HR   1 Minute HR 124     2 Minute HR 111     3 Minute HR 120     4 Minute HR 121     5 Minute HR 126     6 Minute HR 122     2 Minute Post HR 95     Interval Heart Rate? Yes  Interval Oxygen    Interval Oxygen ? Yes     Baseline Oxygen  Saturation % 95 %  2L     1 Minute Oxygen  Saturation % 88 %  titrated to 3L     1 Minute Liters of Oxygen  3 L     2 Minute Oxygen  Saturation % 91 %  1:34 dropped to 84%, O2 up to 4L     2 Minute Liters of Oxygen  4 L     3 Minute Oxygen  Saturation % 91 %     3 Minute Liters of Oxygen  4 L     4 Minute Oxygen  Saturation % 93 %  3:20 O2 dropped to 88%, O2 titrated to 6L     4 Minute Liters of Oxygen  6 L     5 Minute Oxygen  Saturation % 91 %     5 Minute Liters of Oxygen  6 L     6 Minute Oxygen  Saturation % 88 %  O2 titrated to 8L     6 Minute Liters of Oxygen  8 L     2 Minute Post Oxygen  Saturation % 98 %     2 Minute Post Liters of Oxygen  6 L         Oxygen  Initial Assessment:  Oxygen  Initial Assessment - 07/10/24 1343       Home Oxygen    Home Oxygen  Device Home Concentrator;E-Tanks    Sleep Oxygen  Prescription Continuous    Liters per minute 2    Home Exercise Oxygen  Prescription Continuous    Liters per minute 5   pulsed   Home Resting Oxygen  Prescription Continuous    Liters per minute 3    Compliance with Home Oxygen  Use Yes      Initial 6 min Walk   Oxygen  Used Continuous    Liters per minute 8      Program Oxygen  Prescription   Program Oxygen  Prescription Continuous    Liters per minute 8      Intervention   Short Term Goals To learn and exhibit compliance with exercise, home and travel O2 prescription;To learn and understand importance of maintaining oxygen  saturations>88%;To learn and demonstrate proper pursed lip breathing techniques or other breathing techniques. ;To learn and understand importance of monitoring SPO2 with pulse oximeter and demonstrate accurate use of the pulse oximeter.;To learn and demonstrate proper use of respiratory medications    Long  Term Goals Exhibits compliance with exercise, home  and travel O2 prescription;Maintenance of O2 saturations>88%;Compliance with respiratory medication;Demonstrates proper use of MDI's;Exhibits proper breathing techniques, such as pursed lip breathing or other method taught during program session;Verbalizes importance of monitoring SPO2 with pulse oximeter and return demonstration          Oxygen  Re-Evaluation:  Oxygen  Re-Evaluation     Row Name 07/25/24 0840             Program Oxygen  Prescription   Program Oxygen  Prescription Continuous       Liters per minute 8       Comments using 6-8L with exercise         Home Oxygen    Home Oxygen  Device Home Concentrator;E-Tanks       Sleep Oxygen  Prescription Continuous       Liters per minute 2       Home Exercise Oxygen  Prescription Continuous       Liters per minute 5  pulsed       Home Resting  Oxygen  Prescription Continuous       Liters per minute  3       Compliance with Home Oxygen  Use Yes         Goals/Expected Outcomes   Short Term Goals To learn and exhibit compliance with exercise, home and travel O2 prescription;To learn and understand importance of maintaining oxygen  saturations>88%;To learn and demonstrate proper pursed lip breathing techniques or other breathing techniques. ;To learn and understand importance of monitoring SPO2 with pulse oximeter and demonstrate accurate use of the pulse oximeter.;To learn and demonstrate proper use of respiratory medications       Long  Term Goals Exhibits compliance with exercise, home  and travel O2 prescription;Maintenance of O2 saturations>88%;Compliance with respiratory medication;Demonstrates proper use of MDI's;Exhibits proper breathing techniques, such as pursed lip breathing or other method taught during program session;Verbalizes importance of monitoring SPO2 with pulse oximeter and return demonstration       Comments 6-8L       Goals/Expected Outcomes Compliance and understanding of oxygen  saturation monitoring and breathing techniques to decrease shortness of breath.          Oxygen  Discharge (Final Oxygen  Re-Evaluation):  Oxygen  Re-Evaluation - 07/25/24 0840       Program Oxygen  Prescription   Program Oxygen  Prescription Continuous    Liters per minute 8    Comments using 6-8L with exercise      Home Oxygen    Home Oxygen  Device Home Concentrator;E-Tanks    Sleep Oxygen  Prescription Continuous    Liters per minute 2    Home Exercise Oxygen  Prescription Continuous    Liters per minute 5   pulsed   Home Resting Oxygen  Prescription Continuous    Liters per minute 3    Compliance with Home Oxygen  Use Yes      Goals/Expected Outcomes   Short Term Goals To learn and exhibit compliance with exercise, home and travel O2 prescription;To learn and understand importance of maintaining oxygen  saturations>88%;To learn and  demonstrate proper pursed lip breathing techniques or other breathing techniques. ;To learn and understand importance of monitoring SPO2 with pulse oximeter and demonstrate accurate use of the pulse oximeter.;To learn and demonstrate proper use of respiratory medications    Long  Term Goals Exhibits compliance with exercise, home  and travel O2 prescription;Maintenance of O2 saturations>88%;Compliance with respiratory medication;Demonstrates proper use of MDI's;Exhibits proper breathing techniques, such as pursed lip breathing or other method taught during program session;Verbalizes importance of monitoring SPO2 with pulse oximeter and return demonstration    Comments 6-8L    Goals/Expected Outcomes Compliance and understanding of oxygen  saturation monitoring and breathing techniques to decrease shortness of breath.          Initial Exercise Prescription:   Perform Capillary Blood Glucose checks as needed.  Exercise Prescription Changes:   Exercise Prescription Changes     Row Name 07/16/24 1500 07/30/24 1500           Response to Exercise   Blood Pressure (Admit) 128/72 120/76      Blood Pressure (Exercise) 186/90 184/80      Blood Pressure (Exit) 128/62 124/72      Heart Rate (Admit) 104 bpm 89 bpm      Heart Rate (Exercise) 151 bpm 122 bpm      Heart Rate (Exit) 114 bpm 98 bpm      Oxygen  Saturation (Admit) 96 %  8L 97 %  6L cont      Oxygen  Saturation (Exercise) 88 %  8L 93 %  8L cont      Oxygen  Saturation (Exit) 93 %  RA 94 %  6L pulsed O2      Rating of Perceived Exertion (Exercise) 14 13      Perceived Dyspnea (Exercise) 3 1      Duration Continue with 30 min of aerobic exercise without signs/symptoms of physical distress. Continue with 30 min of aerobic exercise without signs/symptoms of physical distress.      Intensity Other (comment)  will contact MD for an increase in THR THRR New  THR increased to 160        Progression   Progression Continue to progress workloads  to maintain intensity without signs/symptoms of physical distress. Continue to progress workloads to maintain intensity without signs/symptoms of physical distress.        Resistance Training   Weight red bands red bands      Reps 10-15 10-15      Time 10 Minutes 10 Minutes        Oxygen    Oxygen  Continuous Continuous      Liters 8-10 8        NuStep   Level 1 3      SPM 103 104      Minutes 15 15      METs 2.1 2        Track   Laps 5 8      Minutes 15 15      METs 1.62 2        Oxygen    Maintain Oxygen  Saturation 88% or higher --         Exercise Comments:   Exercise Goals and Review:   Exercise Goals     Row Name 07/10/24 1343             Exercise Goals   Increase Physical Activity Yes       Intervention Provide advice, education, support and counseling about physical activity/exercise needs.;Develop an individualized exercise prescription for aerobic and resistive training based on initial evaluation findings, risk stratification, comorbidities and participant's personal goals.       Expected Outcomes Short Term: Attend rehab on a regular basis to increase amount of physical activity.;Long Term: Exercising regularly at least 3-5 days a week.;Long Term: Add in home exercise to make exercise part of routine and to increase amount of physical activity.       Increase Strength and Stamina Yes       Intervention Provide advice, education, support and counseling about physical activity/exercise needs.;Develop an individualized exercise prescription for aerobic and resistive training based on initial evaluation findings, risk stratification, comorbidities and participant's personal goals.       Expected Outcomes Short Term: Increase workloads from initial exercise prescription for resistance, speed, and METs.;Short Term: Perform resistance training exercises routinely during rehab and add in resistance training at home;Long Term: Improve cardiorespiratory fitness, muscular  endurance and strength as measured by increased METs and functional capacity ( )       Able to understand and use rate of perceived exertion (RPE) scale Yes       Intervention Provide education and explanation on how to use RPE scale       Expected Outcomes Long Term:  Able to use RPE to guide intensity level when exercising independently;Short Term: Able to use RPE daily in rehab to express subjective intensity level       Able to understand and use Dyspnea scale Yes       Intervention Provide education and explanation on how to use Dyspnea scale  Expected Outcomes Short Term: Able to use Dyspnea scale daily in rehab to express subjective sense of shortness of breath during exertion;Long Term: Able to use Dyspnea scale to guide intensity level when exercising independently       Knowledge and understanding of Target Heart Rate Range (THRR) Yes       Intervention Provide education and explanation of THRR including how the numbers were predicted and where they are located for reference       Expected Outcomes Short Term: Able to state/look up THRR;Short Term: Able to use daily as guideline for intensity in rehab;Long Term: Able to use THRR to govern intensity when exercising independently       Understanding of Exercise Prescription Yes       Intervention Provide education, explanation, and written materials on patient's individual exercise prescription       Expected Outcomes Short Term: Able to explain program exercise prescription;Long Term: Able to explain home exercise prescription to exercise independently          Exercise Goals Re-Evaluation :  Exercise Goals Re-Evaluation     Row Name 07/25/24 0836             Exercise Goal Re-Evaluation   Exercise Goals Review Increase Physical Activity;Able to understand and use Dyspnea scale;Understanding of Exercise Prescription;Increase Strength and Stamina;Knowledge and understanding of Target Heart Rate Range (THRR);Able to understand  and use rate of perceived exertion (RPE) scale       Comments Pt has completed 3 exercise sessions with good attendance. She is exercising on the recumbent stepper for 15 min, level 1, METs 1.9-2.1. Will increase to level 2 this next session. She then is walking the track with the gocart for 15 min, METs 2.5. She performs warm up and cool down standing, squats supported. Using red bands. Will progress as tolerated.       Expected Outcomes Through exercise at rehab and at home, patient will increase physical capacity and ADL's will be easier to perform. The patient will also feel comfortable establishing a home exercise program.          Discharge Exercise Prescription (Final Exercise Prescription Changes):  Exercise Prescription Changes - 07/30/24 1500       Response to Exercise   Blood Pressure (Admit) 120/76    Blood Pressure (Exercise) 184/80    Blood Pressure (Exit) 124/72    Heart Rate (Admit) 89 bpm    Heart Rate (Exercise) 122 bpm    Heart Rate (Exit) 98 bpm    Oxygen  Saturation (Admit) 97 %   6L cont   Oxygen  Saturation (Exercise) 93 %   8L cont   Oxygen  Saturation (Exit) 94 %   6L pulsed O2   Rating of Perceived Exertion (Exercise) 13    Perceived Dyspnea (Exercise) 1    Duration Continue with 30 min of aerobic exercise without signs/symptoms of physical distress.    Intensity THRR New   THR increased to 160     Progression   Progression Continue to progress workloads to maintain intensity without signs/symptoms of physical distress.      Resistance Training   Weight red bands    Reps 10-15    Time 10 Minutes      Oxygen    Oxygen  Continuous    Liters 8      NuStep   Level 3    SPM 104    Minutes 15    METs 2      Track  Laps 8    Minutes 15    METs 2          Nutrition:  Target Goals: Understanding of nutrition guidelines, daily intake of sodium 1500mg , cholesterol 200mg , calories 30% from fat and 7% or less from saturated fats, daily to have 5 or more  servings of fruits and vegetables.  Biometrics:  Pre Biometrics - 07/10/24 1326       Pre Biometrics   Grip Strength 28 kg   old dynomometer          Nutrition Therapy Plan and Nutrition Goals:  Nutrition Therapy & Goals - 07/17/24 1626       Nutrition Therapy   Diet Heart Healthy Diet      Personal Nutrition Goals   Nutrition Goal Patient to improve diet quality by using the plate method as a guide for meal planning to include lean protein/plant protein, fruits, vegetables, whole grains, nonfat dairy as part of a well-balanced diet.    Comments Patient has medical history of pulmonary HTN, OSA, hyperlipidemia, Dm2, chrohn's disease, pernicious anemia, morbid obesity. A1c is well controlled in a prediabetic range. LDL is not at goal; zetia was previously recommended as statin was declined but not currently prescribed. Patient will benefit from participation in intensive cardiac rehab for nutrition education, exercise, and lifestyle modification.      Intervention Plan   Intervention Nutrition handout(s) given to patient.;Prescribe, educate and counsel regarding individualized specific dietary modifications aiming towards targeted core components such as weight, hypertension, lipid management, diabetes, heart failure and other comorbidities.    Expected Outcomes Short Term Goal: Understand basic principles of dietary content, such as calories, fat, sodium, cholesterol and nutrients.;Long Term Goal: Adherence to prescribed nutrition plan.          Nutrition Assessments:  Nutrition Assessments - 07/17/24 0913       Rate Your Plate Scores   Pre Score 76         MEDIFICTS Score Key: >=70 Need to make dietary changes  40-70 Heart Healthy Diet <= 40 Therapeutic Level Cholesterol Diet  Flowsheet Row PULMONARY REHAB OTHER RESPIRATORY from 07/16/2024 in [facility]  Picture Your Plate Total Score on Admission 76    Picture Your Plate Scores: <59 Unhealthy dietary pattern with much room for improvement. 41-50 Dietary pattern unlikely to meet recommendations for good health and room for improvement. 51-60 More healthful dietary pattern, with some room for improvement.  >60 Healthy dietary pattern, although there may be some specific behaviors that could be improved.    Nutrition Goals Re-Evaluation:  Nutrition Goals Re-Evaluation     Row Name 07/17/24 1626             Goals   Current Weight 271 lb 13.2 oz (123.3 kg)       Comment A1c 6.1, cholesterol 203, LDL 131       Expected Outcome Patient has medical history of pulmonary HTN, OSA, hyperlipidemia, Dm2, chrohn's disease, pernicious anemia, morbid obesity. A1c is well controlled in a prediabetic range. LDL is not at goal; zetia was previously recommended as statin was declined but not currently prescribed. Patient will benefit from participation in intensive cardiac rehab for nutrition education, exercise, and lifestyle modification.          Nutrition Goals Discharge (Final Nutrition Goals Re-Evaluation):  Nutrition Goals Re-Evaluation - 07/17/24 1626       Goals   Current Weight 271 lb 13.2 oz (  123.3 kg)    Comment A1c 6.1, cholesterol 203, LDL 131    Expected Outcome Patient has medical history of pulmonary HTN, OSA, hyperlipidemia, Dm2, chrohn's disease, pernicious anemia, morbid obesity. A1c is well controlled in a prediabetic range. LDL is not at goal; zetia was previously recommended as statin was declined but not currently prescribed. Patient will benefit from participation in intensive cardiac rehab for nutrition education, exercise, and lifestyle modification.          Psychosocial: Target Goals: Acknowledge presence or absence of significant depression and/or stress, maximize coping skills, provide positive support system. Participant is able to verbalize types and ability to use techniques and skills needed for reducing  stress and depression.  Initial Review & Psychosocial Screening:  Initial Psych Review & Screening - 07/10/24 1345       Initial Review   Current issues with Current Anxiety/Panic;History of Depression;Current Psychotropic Meds      Family Dynamics   Good Support System? Yes      Barriers   Psychosocial barriers to participate in program There are no identifiable barriers or psychosocial needs.      Screening Interventions   Interventions Provide feedback about the scores to participant    Expected Outcomes Short Term goal: Utilizing psychosocial counselor, staff and physician to assist with identification of specific Stressors or current issues interfering with healing process. Setting desired goal for each stressor or current issue identified.;Long Term Goal: Stressors or current issues are controlled or eliminated.;Short Term goal: Identification and review with participant of any Quality of Life or Depression concerns found by scoring the questionnaire.;Long Term goal: The participant improves quality of Life and PHQ9 Scores as seen by post scores and/or verbalization of changes          Quality of Life Scores:  Scores of 19 and below usually indicate a poorer quality of life in these areas.  A difference of  2-3 points is a clinically meaningful difference.  A difference of 2-3 points in the total score of the Quality of Life Index has been associated with significant improvement in overall quality of life, self-image, physical symptoms, and general health in studies assessing change in quality of life.  PHQ-9: Review Flowsheet  More data exists      07/10/2024 05/29/2024 03/04/2024 12/04/2023 11/25/2022  Depression screen PHQ 2/9  Decreased Interest 0 0 0 0 0  Down, Depressed, Hopeless 0 0 0 0 0  PHQ - 2 Score 0 0 0 0 0  Altered sleeping 1 - - - -  Tired, decreased energy 1 - - - -  Change in appetite 0 - - - -  Feeling bad or failure about yourself  0 - - - -  Trouble  concentrating 1 - - - -  Moving slowly or fidgety/restless 0 - - - -  Suicidal thoughts 0 - - - -  PHQ-9 Score 3 - - - -  Difficult doing work/chores Not difficult at all - - - -   Interpretation of Total Score  Total Score Depression Severity:  1-4 = Minimal depression, 5-9 = Mild depression, 10-14 = Moderate depression, 15-19 = Moderately severe depression, 20-27 = Severe depression   Psychosocial Evaluation and Intervention:  Psychosocial Evaluation - 07/10/24 1346       Psychosocial Evaluation & Interventions   Interventions Encouraged to exercise with the program and follow exercise prescription    Comments Patty [name] denies any psy/soc barriers or conerns at this time. She feels like her  mental health is stable and is compliant with taking her psychotropic meds    Expected Outcomes For Patty [name] to participate in PR without any psy/soc barriers or concerns    Continue Psychosocial Services  No Follow up required          Psychosocial Re-Evaluation:  Psychosocial Re-Evaluation     Row Name 07/26/24 1455             Psychosocial Re-Evaluation   Current issues with Current Anxiety/Panic;History of Depression;Current Psychotropic Meds       Comments Monthly psychosocial re-evaluation as follows: Patty [name] denies any psy/soc barriers or concerns at this time. She feels like her mental health is stable and is compliant with taking her psychotropic meds       Expected Outcomes For Patty [name] to participate in PR without any psy/soc barriers or concerns       Interventions Stress management education;Relaxation education       Continue Psychosocial Services  No Follow up required          Psychosocial Discharge (Final Psychosocial Re-Evaluation):  Psychosocial Re-Evaluation - 07/26/24 1455       Psychosocial Re-Evaluation   Current issues with Current Anxiety/Panic;History of Depression;Current Psychotropic Meds    Comments Monthly psychosocial re-evaluation as follows: Patty [name] denies any  psy/soc barriers or concerns at this time. She feels like her mental health is stable and is compliant with taking her psychotropic meds    Expected Outcomes For Patty [name] to participate in PR without any psy/soc barriers or concerns    Interventions Stress management education;Relaxation education    Continue Psychosocial Services  No Follow up required          Education: Education Goals: Education classes will be provided on a weekly basis, covering required topics. Participant will state understanding/return demonstration of topics presented.  Learning Barriers/Preferences:  Learning Barriers/Preferences - 07/10/24 1348       Learning Barriers/Preferences   Learning Barriers None    Learning Preferences None          Education Topics: Know Your Numbers Group instruction that is supported by a PowerPoint presentation. Instructor discusses importance of knowing and understanding resting, exercise, and post-exercise oxygen  saturation, heart rate, and blood pressure. Oxygen  saturation, heart rate, blood pressure, rating of perceived exertion, and dyspnea are reviewed along with a normal range for these values.    Exercise for the Pulmonary Patient Group instruction that is supported by a PowerPoint presentation. Instructor discusses benefits of exercise, core components of exercise, frequency, duration, and intensity of an exercise routine, importance of utilizing pulse oximetry during exercise, safety while exercising, and options of places to exercise outside of rehab.    MET Level  Group instruction provided by PowerPoint, verbal discussion, and written material to support subject matter. Instructor reviews what METs are and how to increase METs.    Pulmonary Medications Verbally interactive group education provided by instructor with focus on inhaled medications and proper administration.   Anatomy and Physiology of the Respiratory System Group instruction provided by  PowerPoint, verbal discussion, and written material to support subject matter. Instructor reviews respiratory cycle and anatomical components of the respiratory system and their functions. Instructor also reviews differences in obstructive and restrictive respiratory diseases with examples of each.  Flowsheet Row PULMONARY REHAB OTHER RESPIRATORY from 04/15/2021 in [facility]  Date 03/04/21  Educator Handout    Oxygen  Safety Group instruction provided by PowerPoint, verbal discussion, and written  material to support subject matter. There is an overview of "What is Oxygen " and "Why do we need it".  Instructor also reviews how to create a safe environment for oxygen  use, the importance of using oxygen  as prescribed, and the risks of noncompliance. There is a brief discussion on traveling with oxygen  and resources the patient may utilize. Flowsheet Row PULMONARY REHAB OTHER RESPIRATORY from 04/15/2021 in [facility]  Date 02/25/21  Educator Handout    Oxygen  Use Group instruction provided by PowerPoint, verbal discussion, and written material to discuss how supplemental oxygen  is prescribed and different types of oxygen  supply systems. Resources for more information are provided.    Breathing Techniques Group instruction that is supported by demonstration and informational handouts. Instructor discusses the benefits of pursed lip and diaphragmatic breathing and detailed demonstration on how to perform both.     Risk Factor Reduction Group instruction that is supported by a PowerPoint presentation. Instructor discusses the definition of a risk factor, different risk factors for pulmonary disease, and how the heart and lungs work together.   Pulmonary Diseases Group instruction provided by PowerPoint, verbal discussion, and written material to support subject matter. Instructor  gives an overview of the different type of pulmonary diseases. There is also a discussion on risk factors and symptoms as well as ways to manage the diseases.   Stress and Energy Conservation Group instruction provided by PowerPoint, verbal discussion, and written material to support subject matter. Instructor gives an overview of stress and the impact it can have on the body. Instructor also reviews ways to reduce stress. There is also a discussion on energy conservation and ways to conserve energy throughout the day. Flowsheet Row PULMONARY REHAB OTHER RESPIRATORY from 07/18/2024 in [facility] for [facility], V[facility], & Lung Health  Date 07/18/24  Educator RN  Instruction Review Code 1- Verbalizes Understanding    Warning Signs and Symptoms Group instruction provided by PowerPoint, verbal discussion, and written material to support subject matter. Instructor reviews warning signs and symptoms of stroke, heart attack, cold and flu. Instructor also reviews ways to prevent the spread of infection. Flowsheet Row PULMONARY REHAB OTHER RESPIRATORY from 07/25/2024 in [facility], [facility], & Lung Health  Date 07/25/24  Educator RN  Instruction Review Code 1- Verbalizes Understanding    Other Education Group or individual verbal, written, or video instructions that support the educational goals of the pulmonary rehab program. Flowsheet Row PULMONARY REHAB OTHER RESPIRATORY from 04/15/2021 in [facility], [facility]  Date 03/25/21  [Beat a Sedentary Lifestyle]  Educator handout     Knowledge Questionnaire Score:  Knowledge Questionnaire Score - 07/10/24 1520       Knowledge Questionnaire Score   Pre Score 17/18          Core Components/Risk Factors/Patient Goals at Admission:  Personal Goals and Risk Factors at Admission - 07/10/24 1349       Core Components/Risk Factors/Patient Goals  on Admission    Weight Management Yes;Obesity;Weight Loss    Intervention Weight Management: Develop a combined nutrition and exercise program designed to reach desired caloric intake, while maintaining appropriate intake of nutrient and fiber, sodium and fats, and appropriate energy expenditure required for the weight goal.;Weight Management: Provide education and appropriate resources to help participant work on and attain dietary goals.;Weight Management/Obesity: Establish reasonable short term and long term weight goals.;Obesity: Provide  education and appropriate resources to help participant work on and attain dietary goals.    Admit Weight 274 lb 11.1 oz (124.6 kg)    Expected Outcomes Short Term: Continue to assess and modify interventions until short term weight is achieved;Long Term: Adherence to nutrition and physical activity/exercise program aimed toward attainment of established weight goal;Weight Maintenance: Understanding of the daily nutrition guidelines, which includes 25-35% calories from fat, 7% or less cal from saturated fats, less than 200mg  cholesterol, less than 1.5gm of sodium, & 5 or more servings of fruits and vegetables daily;Weight Loss: Understanding of general recommendations for a balanced deficit meal plan, which promotes 1-2 lb weight loss per week and includes a negative energy balance of [phone] kcal/d;Understanding recommendations for meals to include 15-35% energy as protein, 25-35% energy from fat, 35-60% energy from carbohydrates, less than 200mg  of dietary cholesterol, 20-35 gm of total fiber daily;Understanding of distribution of calorie intake throughout the day with the consumption of 4-5 meals/snacks    Improve shortness of breath with ADL's Yes    Intervention Provide education, individualized exercise plan and daily activity instruction to help decrease symptoms of SOB with activities of daily living.    Expected Outcomes Short Term: Improve cardiorespiratory  fitness to achieve a reduction of symptoms when performing ADLs;Long Term: Be able to perform more ADLs without symptoms or delay the onset of symptoms          Core Components/Risk Factors/Patient Goals Review:   Goals and Risk Factor Review     Row Name 07/26/24 1457             Core Components/Risk Factors/Patient Goals Review   Personal Goals Review Weight Management/Obesity;Improve shortness of breath with ADL's;Develop more efficient breathing techniques such as purse lipped breathing and diaphragmatic breathing and practicing self-pacing with activity.       Review Monthly review of patient's Core Components/Risk Factors/Patient Goals are as follows: Goal in progress for improving her shortness of breath with ADLs. Patty [name] needed to increase to 8L with exertion. She has been exercising on the Nu[employer] and walking the track. She has completed 4 sessions and is making great progress. She is motivated to exercise to achieve her goals. Goal met on developing more efficient breathing techniques such as purse lipped breathing and diaphragmatic breathing; and practicing self-pacing with activity. Patty [name] can initiate pursed lip breathing and is practicing diaphragmatic breathing before bed. She can self-pace when needed and allows herself to take breaks while walking or exercising. This is her 3rd time being in the program. Goal progressing on weight loss. Patty [name] has been working with our dietician on ways to incorporate the plate method and choosing a wide variety of foods. She is working on decreasing her caloric intake as well as eating out less. She is down ~3.3#. We will continue to monitor Patty [name]'s progress throughout the program.       Expected Outcomes Pt will show progress toward meeting expected goals and outcomes.          Core Components/Risk Factors/Patient Goals at Discharge (Final Review):   Goals and Risk Factor Review - 07/26/24 1457       Core Components/Risk Factors/Patient Goals  Review   Personal Goals Review Weight Management/Obesity;Improve shortness of breath with ADL's;Develop more efficient breathing techniques such as purse lipped breathing and diaphragmatic breathing and practicing self-pacing with activity.    Review Monthly review of patient's Core Components/Risk Factors/Patient Goals are as follows: Goal in progress for improving her shortness of  breath with ADLs. Aunya needed to increase to 8L with exertion. She has been exercising on the NuStep and walking the track. She has completed 4 sessions and is making great progress. She is motivated to exercise to achieve her goals. Goal met on developing more efficient breathing techniques such as purse lipped breathing and diaphragmatic breathing; and practicing self-pacing with activity. Kortni can initiate pursed lip breathing and is practicing diaphragmatic breathing before bed. She can self-pace when needed and allows herself to take breaks while walking or exercising. This is her 3rd time being in the program. Goal progressing on weight loss. Mallerie has been working with our dietician on ways to incorporate the plate method and choosing a wide variety of foods. She is working on decreasing her caloric intake as well as eating out less. She is down ~3.3#. We will continue to monitor Emeli's progress throughout the program.    Expected Outcomes Pt will show progress toward meeting expected goals and outcomes.          ITP Comments: Pt is making expected progress toward Pulmonary Rehab goals after completing 5 session(s). Recommend continued exercise, life style modification, education, and utilization of breathing techniques to increase stamina and strength, while also decreasing shortness of breath with exertion.     Comments: Dr. Slater Staff is Medical Director for Pulmonary Rehab at [facility].

## 2024-08-01 ENCOUNTER — Encounter (HOSPITAL_COMMUNITY)
Admission: RE | Admit: 2024-08-01 | Discharge: 2024-08-01 | Disposition: A | Discharge status: Home / Self Care | Source: Ambulatory Visit | Attending: Internal Medicine | Admitting: Internal Medicine

## 2024-08-01 DIAGNOSIS — J849 Interstitial pulmonary disease, unspecified: Secondary | ICD-10-CM | POA: Diagnosis not present

## 2024-08-01 DIAGNOSIS — J9611 Chronic respiratory failure with hypoxia: Secondary | ICD-10-CM

## 2024-08-01 NOTE — Progress Notes (Signed)
 Daily Session Note  Patient Details  Name: Patty [name] MRN: [id number] Date of Birth: April 11, 1951 Referring Provider:   Conrad Ports Pulmonary Rehab Walk Test from 02/15/2021 in [facility], [facility]  Referring Provider Dr. Geronimo    Encounter Date: 08/01/2024  Check In:  Session Check In - 08/01/24 1457       Check-In   Supervising physician immediately available to respond to emergencies [facility] MD immediately available    Physician(s) Orren Fabry, PA    Location [facility]    Staff Present Ronal Levin, RN, Maud Moats, MS, ACSM-CEP, Exercise Physiologist;Casey Claudene, RT;Denissa Cozart Midge BS, ACSM-CEP, Exercise Physiologist;Bailey Elnor, MS, Exercise Physiologist    Virtual Visit No    Medication changes reported     No    Fall or balance concerns reported    No    Tobacco Cessation No Change    Warm-up and Cool-down Performed as group-led instruction    Resistance Training Performed Yes    VAD Patient? No    PAD/SET Patient? No      Pain Assessment   Currently in Pain? No/denies    Multiple Pain Sites No          Capillary Blood Glucose: No results found for this or any previous visit (from the past 24 hours).    Social History   Tobacco Use  Smoking Status Former   Current packs/day: 0.00   Average packs/day: 1 pack/day for 45.0 years (45.0 ttl pk-yrs)   Types: Cigarettes   Start date: 12/27/1963   Quit date: 12/26/2008   Years since quitting: 15.6  Smokeless Tobacco Never    Goals Met:  Exercise tolerated well No report of concerns or symptoms today Strength training completed today  Goals Unmet:  Not Applicable  Comments: Service time is from 1315 to 1445.    Dr. Slater Staff is Medical Director for Pulmonary Rehab at [facility].

## 2024-08-06 ENCOUNTER — Encounter (HOSPITAL_COMMUNITY)
Admission: RE | Admit: 2024-08-06 | Discharge: 2024-08-06 | Disposition: A | Discharge status: Home / Self Care | Source: Ambulatory Visit | Attending: Internal Medicine | Admitting: Internal Medicine

## 2024-08-06 DIAGNOSIS — J849 Interstitial pulmonary disease, unspecified: Secondary | ICD-10-CM

## 2024-08-06 NOTE — Progress Notes (Signed)
 Daily Session Note  Patient Details  Name: Patty [name] MRN: [id number] Date of Birth: [birth year] Referring Provider:   Conrad Ports Pulmonary Rehab Walk Test from 02/15/2021 in [facility]  Referring Provider Dr. Geronimo    Encounter Date: 08/06/2024  Check In:  Session Check In - 08/06/24 1455       Check-In   Supervising physician immediately available to respond to emergencies [facility] MD immediately available    Physician(s) Rosabel Mose, NP    [facility]    Staff Present Johnnie Moats, MS, ACSM-CEP, Exercise Physiologist;Casey Claudene Maya Koyanagi, RN, Avonne Gal, MS, ACSM-CEP, Exercise Physiologist;Annedrea Violetta, RN, [place]    Virtual Visit No    Medication changes reported     No    Fall or balance concerns reported    No    Tobacco Cessation No Change    Warm-up and Cool-down Performed as group-led instruction    Resistance Training Performed Yes    VAD Patient? No    PAD/SET Patient? No      Pain Assessment   Currently in Pain? No/denies    Multiple Pain Sites No          Capillary Blood Glucose: No results found for this or any previous visit (from the past 24 hours).    Social History   Tobacco Use  Smoking Status Former   Current packs/day: 0.00   Average packs/day: 1 pack/day for 45.0 years (45.0 ttl pk-yrs)   Types: Cigarettes   Start date: 12/27/1963   Quit date: 12/26/2008   Years since quitting: 15.6  Smokeless Tobacco Never    Goals Met:  Proper associated with RPD/PD & O2 Sat Exercise tolerated well No report of concerns or symptoms today Strength training completed today  Goals Unmet:  Not Applicable  Comments: Service time is from 1314 to 1434.    Dr. Slater Staff is Medical Director for Pulmonary Rehab at [facility].

## 2024-08-08 ENCOUNTER — Encounter (HOSPITAL_COMMUNITY)
Admission: RE | Admit: 2024-08-08 | Discharge: 2024-08-08 | Disposition: A | Discharge status: Home / Self Care | Source: Ambulatory Visit | Attending: Internal Medicine | Admitting: Internal Medicine

## 2024-08-08 DIAGNOSIS — J849 Interstitial pulmonary disease, unspecified: Secondary | ICD-10-CM

## 2024-08-08 DIAGNOSIS — J9611 Chronic respiratory failure with hypoxia: Secondary | ICD-10-CM

## 2024-08-08 NOTE — Progress Notes (Signed)
 Daily Session Note  Patient Details  Name: Patty [name] MRN: [id number] Date of Birth: 1951-01-22 Referring Provider:   Conrad Ports Pulmonary Rehab Walk Test from 02/15/2021 in [facility], [facility]  Referring Provider Dr. Geronimo    Encounter Date: 08/08/2024  Check In:  Session Check In - 08/08/24 1459       Check-In   Supervising physician immediately available to respond to emergencies [facility] MD immediately available    Physician(s) Damien Braver, NP    [facility]    Staff Present Johnnie Moats, MS, ACSM-CEP, Exercise Physiologist;Casey Claudene Unknown Candy, MS, Exercise Physiologist;Brittnae Aschenbrenner Harvy, RN, BSN    Virtual Visit No    Medication changes reported     No    Fall or balance concerns reported    No    Tobacco Cessation No Change    Warm-up and Cool-down Performed as group-led instruction    Resistance Training Performed Yes    VAD Patient? No    PAD/SET Patient? No      Pain Assessment   Currently in Pain? No/denies    Multiple Pain Sites No          Capillary Blood Glucose: No results found for this or any previous visit (from the past 24 hours).    Social History   Tobacco Use  Smoking Status Former   Current packs/day: 0.00   Average packs/day: 1 pack/day for 45.0 years (45.0 ttl pk-yrs)   Types: Cigarettes   Start date: 12/27/1963   Quit date: 12/26/2008   Years since quitting: 15.6  Smokeless Tobacco Never    Goals Met:  Exercise tolerated well No report of concerns or symptoms today Strength training completed today  Goals Unmet:  Not Applicable  Comments: Service time is from 1315 to 1435    Dr. Slater Staff is Medical Director for Pulmonary Rehab at [facility].

## 2024-08-12 NOTE — Telephone Encounter (Signed)
???   Was this message for me?> There is no context like a Scientific laboratory technician. Please advise

## 2024-08-12 NOTE — Telephone Encounter (Signed)
 This was already handled- see 07/23/24 phone encounter.

## 2024-08-13 ENCOUNTER — Encounter (HOSPITAL_COMMUNITY)
Admission: RE | Admit: 2024-08-13 | Discharge: 2024-08-13 | Disposition: A | Discharge status: Home / Self Care | Source: Ambulatory Visit | Attending: Internal Medicine | Admitting: Internal Medicine

## 2024-08-13 VITALS — Wt 276.2 lb

## 2024-08-13 DIAGNOSIS — J849 Interstitial pulmonary disease, unspecified: Secondary | ICD-10-CM | POA: Diagnosis not present

## 2024-08-13 DIAGNOSIS — J9611 Chronic respiratory failure with hypoxia: Secondary | ICD-10-CM

## 2024-08-13 NOTE — Progress Notes (Signed)
 Daily Session Note  Patient Details  Name: Patty [name] MRN: [id number] Date of Birth: [birth year] Referring Provider:   Conrad Ports Pulmonary Rehab Walk Test from 02/15/2021 in [facility] for Heart, Vascular, & Lung Health  Referring Provider Dr. Geronimo    Encounter Date: 08/13/2024  Check In:  Session Check In - 08/13/24 1450       Check-In   Supervising physician immediately available to respond to emergencies [facility] MD immediately available    Physician(s) Lum Louis, NP    Location [facility]    Staff Present Johnnie Moats, MS, ACSM-CEP, Exercise Physiologist;Casey Claudene Candia Levin, RN, Valere Music, RN, BSN    Virtual Visit No    Medication changes reported     No    Fall or balance concerns reported    No    Tobacco Cessation No Change    Warm-up and Cool-down Performed as group-led instruction    Resistance Training Performed Yes    VAD Patient? No    PAD/SET Patient? No      Pain Assessment   Currently in Pain? No/denies          Capillary Blood Glucose: No results found for this or any previous visit (from the past 24 hours).   Exercise Prescription Changes - 08/13/24 1500       Response to Exercise   Blood Pressure (Admit) 122/72    Blood Pressure (Exercise) 184/88    Blood Pressure (Exit) 144/82    Heart Rate (Admit) 83 bpm    Heart Rate (Exercise) 130 bpm    Heart Rate (Exit) 93 bpm    Oxygen  Saturation (Admit) 97 %   6L   Oxygen  Saturation (Exercise) 91 %   8L   Oxygen  Saturation (Exit) 97 %   8L   Rating of Perceived Exertion (Exercise) 13    Perceived Dyspnea (Exercise) 3    Duration Continue with 30 min of aerobic exercise without signs/symptoms of physical distress.    Intensity THRR unchanged      Progression   Progression Continue to progress workloads to maintain intensity without signs/symptoms of physical distress.      Resistance Training   Weight red bands    Reps  10-15    Time 10 Minutes      Interval Training   Interval Training No      Oxygen    Oxygen  Continuous    Liters 6-8      NuStep   Level 3    SPM 139    Minutes 15    METs 2.5      Track   Laps 14   156ft track   Minutes 15    METs 2.46      Oxygen    Maintain Oxygen  Saturation 88% or higher   >90%         Social History   Tobacco Use  Smoking Status Former   Current packs/day: 0.00   Average packs/day: 1 pack/day for 45.0 years (45.0 ttl pk-yrs)   Types: Cigarettes   Start date: 12/27/1963   Quit date: 12/26/2008   Years since quitting: 15.6  Smokeless Tobacco Never    Goals Met:  Exercise tolerated well No report of concerns or symptoms today Strength training completed today  Goals Unmet:  Not Applicable  Comments: Service time is from 1325 to 1440    Dr. Slater Staff is Medical Director for Pulmonary Rehab at [facility].

## 2024-08-15 ENCOUNTER — Encounter (HOSPITAL_COMMUNITY)
Admission: RE | Admit: 2024-08-15 | Discharge: 2024-08-15 | Disposition: A | Discharge status: Home / Self Care | Source: Ambulatory Visit | Attending: Internal Medicine | Admitting: Internal Medicine

## 2024-08-15 DIAGNOSIS — J849 Interstitial pulmonary disease, unspecified: Secondary | ICD-10-CM

## 2024-08-15 DIAGNOSIS — J9611 Chronic respiratory failure with hypoxia: Secondary | ICD-10-CM

## 2024-08-15 NOTE — Progress Notes (Signed)
 Daily Session Note  Patient Details  Name: Patty [name] MRN: [id number] Date of Birth: [birth year] Referring Provider:   Conrad Ports Pulmonary Rehab Walk Test from 02/15/2021 in [facility] for Heart, Vascular, & Lung Health  Referring Provider Dr. Geronimo    Encounter Date: 08/15/2024  Check In:  Session Check In - 08/15/24 1434       Check-In   Supervising physician immediately available to respond to emergencies [facility] MD immediately available    Physician(s) Rosaline Bane, NP    [facility]    Staff Present Johnnie Moats, MS, ACSM-CEP, Exercise Physiologist;Casey Claudene Candia Levin, RN, BSN;Randi Reeve BS, ACSM-CEP, Exercise Physiologist    Virtual Visit No    Medication changes reported     No    Fall or balance concerns reported    No    Tobacco Cessation No Change    Warm-up and Cool-down Performed as group-led instruction    Resistance Training Performed Yes    VAD Patient? No    PAD/SET Patient? No      Pain Assessment   Currently in Pain? No/denies    Multiple Pain Sites No          Capillary Blood Glucose: No results found for this or any previous visit (from the past 24 hours).    Social History   Tobacco Use  Smoking Status Former   Current packs/day: 0.00   Average packs/day: 1 pack/day for 45.0 years (45.0 ttl pk-yrs)   Types: Cigarettes   Start date: 12/27/1963   Quit date: 12/26/2008   Years since quitting: 15.6  Smokeless Tobacco Never    Goals Met:  Independence with exercise equipment Exercise tolerated well No report of concerns or symptoms today Strength training completed today  Goals Unmet:  Not Applicable  Comments: Service time is from 1319 to 1451    Dr. Slater Staff is Medical Director for Pulmonary Rehab at [facility].

## 2024-08-20 ENCOUNTER — Encounter (HOSPITAL_COMMUNITY)
Admission: RE | Admit: 2024-08-20 | Discharge: 2024-08-20 | Disposition: A | Discharge status: Home / Self Care | Source: Ambulatory Visit | Attending: Internal Medicine

## 2024-08-20 ENCOUNTER — Telehealth: Payer: Self-pay | Admitting: Internal Medicine

## 2024-08-20 DIAGNOSIS — J849 Interstitial pulmonary disease, unspecified: Secondary | ICD-10-CM | POA: Diagnosis not present

## 2024-08-20 DIAGNOSIS — J9611 Chronic respiratory failure with hypoxia: Secondary | ICD-10-CM

## 2024-08-20 NOTE — Progress Notes (Signed)
 Daily Session Note  Patient Details  Name: Patty [name] MRN: [id number] Date of Birth: 03/28/1951 Referring Provider:   Conrad Ports Pulmonary Rehab Walk Test from 02/15/2021 in [facility] for Heart, Vascular, & Lung Health  Referring Provider Dr. Geronimo    Encounter Date: 08/20/2024  Check In:  Session Check In - 08/20/24 1347       Check-In   Supervising physician immediately available to respond to emergencies [facility] MD immediately available    Physician(s) Rosaline Bane, NP    [facility]    Staff Present Johnnie Moats, MS, ACSM-CEP, Exercise Physiologist;Juliya Magill Claudene Candia Levin, RN, BSN;Randi Reeve BS, ACSM-CEP, Exercise Physiologist    Virtual Visit No    Medication changes reported     No    Fall or balance concerns reported    No    Tobacco Cessation No Change    Warm-up and Cool-down Performed as group-led instruction    Resistance Training Performed Yes    VAD Patient? No    PAD/SET Patient? No      Pain Assessment   Currently in Pain? No/denies    Multiple Pain Sites No          Capillary Blood Glucose: No results found for this or any previous visit (from the past 24 hours).    Social History   Tobacco Use  Smoking Status Former   Current packs/day: 0.00   Average packs/day: 1 pack/day for 45.0 years (45.0 ttl pk-yrs)   Types: Cigarettes   Start date: 12/27/1963   Quit date: 12/26/2008   Years since quitting: 15.6  Smokeless Tobacco Never    Goals Met:  Proper associated with RPD/PD & O2 Sat Independence with exercise equipment Exercise tolerated well No report of concerns or symptoms today Strength training completed today  Goals Unmet:  Not Applicable  Comments: Service time is from 1315 to 1441.    Dr. Slater Staff is Medical Director for Pulmonary Rehab at [facility].

## 2024-08-20 NOTE — Telephone Encounter (Signed)
 Ok what do I need to do?  Thanks     SIGNATURE    Dr. Dorethia Cave, M.D., F.C.C.P,  Pulmonary and Critical Care Medicine Staff Physician, [facility] Director - Interstitial Lung Disease  Program  Pulmonary Fibrosis [facility] at [facility] [place], [place], [address]   Pager: [phone], If no answer  -> Check AMION or Try [phone] Telephone (clinical office): [phone] Telephone (research): [phone]  6:18 AM 08/20/2024

## 2024-08-20 NOTE — Telephone Encounter (Signed)
 ATC x1 LVM for patient to call or office back.

## 2024-08-22 ENCOUNTER — Encounter (HOSPITAL_COMMUNITY)
Admission: RE | Admit: 2024-08-22 | Discharge: 2024-08-22 | Disposition: A | Discharge status: Home / Self Care | Source: Ambulatory Visit | Attending: Internal Medicine | Admitting: Internal Medicine

## 2024-08-22 DIAGNOSIS — J9611 Chronic respiratory failure with hypoxia: Secondary | ICD-10-CM

## 2024-08-22 DIAGNOSIS — J849 Interstitial pulmonary disease, unspecified: Secondary | ICD-10-CM | POA: Diagnosis not present

## 2024-08-22 NOTE — Progress Notes (Signed)
 Daily Session Note  Patient Details  Name: Patty [name] MRN: [id number] Date of Birth: 21-Aug-1951 Referring Provider:   Conrad Ports Pulmonary Rehab Walk Test from 02/15/2021 in [facility] for Heart, Vascular, & Lung Health  Referring Provider Dr. Geronimo    Encounter Date: 08/22/2024  Check In:  Session Check In - 08/22/24 1419       Check-In   Supervising physician immediately available to respond to emergencies [facility] MD immediately available    Physician(s) Jackee Wyn Raddle, NP    [facility]    Staff Present Johnnie Moats, MS, ACSM-CEP, Exercise Physiologist;Gracen Southwell Claudene Candia Levin, RN, BSN;Randi Reeve BS, ACSM-CEP, Exercise Physiologist    Virtual Visit No    Medication changes reported     No    Fall or balance concerns reported    No    Tobacco Cessation No Change    Warm-up and Cool-down Performed as group-led instruction    Resistance Training Performed Yes    VAD Patient? No    PAD/SET Patient? No      Pain Assessment   Currently in Pain? No/denies    Multiple Pain Sites No          Capillary Blood Glucose: No results found for this or any previous visit (from the past 24 hours).    Social History   Tobacco Use  Smoking Status Former   Current packs/day: 0.00   Average packs/day: 1 pack/day for 45.0 years (45.0 ttl pk-yrs)   Types: Cigarettes   Start date: 12/27/1963   Quit date: 12/26/2008   Years since quitting: 15.6  Smokeless Tobacco Never    Goals Met:  Proper associated with RPD/PD & O2 Sat Independence with exercise equipment Exercise tolerated well No report of concerns or symptoms today Strength training completed today  Goals Unmet:  Not Applicable  Comments: Service time is from 1306 to 1451.    Dr. Slater Staff is Medical Director for Pulmonary Rehab at [facility].

## 2024-08-27 ENCOUNTER — Encounter (HOSPITAL_COMMUNITY)
Admission: RE | Admit: 2024-08-27 | Discharge: 2024-08-27 | Disposition: A | Discharge status: Home / Self Care | Source: Ambulatory Visit | Attending: Internal Medicine | Admitting: Internal Medicine

## 2024-08-27 VITALS — Wt 271.2 lb

## 2024-08-27 DIAGNOSIS — J9611 Chronic respiratory failure with hypoxia: Secondary | ICD-10-CM | POA: Diagnosis present

## 2024-08-27 DIAGNOSIS — J849 Interstitial pulmonary disease, unspecified: Secondary | ICD-10-CM | POA: Diagnosis present

## 2024-08-27 NOTE — Progress Notes (Signed)
 Daily Session Note  Patient Details  Name: Patty [name] MRN: [id number] Date of Birth: 01-14-1951 Referring Provider:   Conrad Ports Pulmonary Rehab Walk Test from 02/15/2021 in [facility] for Heart, Vascular, & Lung Health  Referring Provider Dr. Geronimo    Encounter Date: 08/27/2024  Check In:  Session Check In - 08/27/24 1326       Check-In   Supervising physician immediately available to respond to emergencies [facility] MD immediately available    Physician(s) Josefa Beauvais, NP    [facility]    Staff Present Johnnie Moats, MS, ACSM-CEP, Exercise Physiologist;Casey Claudene Candia Levin, RN, BSN;Demarious Kapur BS, ACSM-CEP, Exercise Physiologist    Virtual Visit No    Medication changes reported     No    Fall or balance concerns reported    No    Tobacco Cessation No Change    Warm-up and Cool-down Performed as group-led instruction    Resistance Training Performed Yes    VAD Patient? No    PAD/SET Patient? No      Pain Assessment   Currently in Pain? No/denies    Multiple Pain Sites No          Capillary Blood Glucose: No results found for this or any previous visit (from the past 24 hours).   Exercise Prescription Changes - 08/27/24 1500       Response to Exercise   Blood Pressure (Admit) 142/70    Blood Pressure (Exercise) 160/82    Blood Pressure (Exit) 128/66    Heart Rate (Admit) 90 bpm    Heart Rate (Exercise) 126 bpm    Heart Rate (Exit) 102 bpm    Oxygen  Saturation (Admit) 96 %    Oxygen  Saturation (Exercise) 96 %    Oxygen  Saturation (Exit) 93 %    Rating of Perceived Exertion (Exercise) 11    Perceived Dyspnea (Exercise) 1    Duration Continue with 30 min of aerobic exercise without signs/symptoms of physical distress.    Intensity THRR unchanged      Progression   Progression Continue to progress workloads to maintain intensity without signs/symptoms of physical distress.      Resistance  Training   Weight blue bands    Reps 10-15    Time 10 Minutes      Interval Training   Interval Training No      Oxygen    Oxygen  Continuous    Liters 6-8      NuStep   Level 3    SPM 168    Minutes 15    METs 2.4      Track   Laps 15    Minutes 15    METs 2.87      Oxygen    Maintain Oxygen  Saturation 88% or higher          Social History   Tobacco Use  Smoking Status Former   Current packs/day: 0.00   Average packs/day: 1 pack/day for 45.0 years (45.0 ttl pk-yrs)   Types: Cigarettes   Start date: 12/27/1963   Quit date: 12/26/2008   Years since quitting: 15.6  Smokeless Tobacco Never    Goals Met:  Exercise tolerated well No report of concerns or symptoms today Strength training completed today  Goals Unmet:  Not Applicable  Comments: Service time is from 1314 to 1437.    Dr. Slater Staff is Medical Director for Pulmonary Rehab at [facility].

## 2024-08-28 NOTE — Progress Notes (Signed)
 Pulmonary Individual Treatment Plan  Patient Details  Name: Patty [name] MRN: [id number] Date of Birth: September 16, 1951 Referring Provider:   Conrad Ports Pulmonary Rehab Walk Test from 02/15/2021 in [facility]  Referring Provider Dr. Geronimo    Initial Encounter Date:  Flowsheet Row Pulmonary Rehab Walk Test from 02/15/2021 in [facility] for Heart, Vascular, & Lung [facility]  Date 02/15/21    Visit Diagnosis: ILD (interstitial lung disease) (HCC)  Chronic respiratory failure with hypoxia (HCC)  Patient's Home Medications on Admission:   Current Outpatient Medications:    albuterol  (VENTOLIN  HFA) 108 (90 Base) MCG/ACT inhaler, INHALE 2 PUFFS INTO THE LUNGS EVERY 6 HOURS AS NEEDED FOR WHEEZING OR SHORTNESS OF BREATH, Disp: 6.7 g, Rfl: 6   amoxicillin  (AMOXIL ) 500 MG capsule, Take 4 capsules (2,000 mg total) by mouth once as needed for up to 1 dose. Before dental procedures, Disp: 20 capsule, Rfl: 1   aspirin  EC 81 MG tablet, Take 81 mg by mouth daily., Disp: , Rfl:    Calcium Carbonate-Vit D-Min (CALCIUM 1200) 1200-1000 MG-UNIT CHEW, Chew 1 tablet by mouth daily., Disp: , Rfl:    CANNABIDIOL PO, Take 1 Dose by mouth 2 (two) times daily as needed (anxiety/sleep.). CBD Oil, Disp: , Rfl:    cetirizine  (ZYRTEC ) 10 MG tablet, Take 10 mg by mouth daily as needed (sinus/allergies.)., Disp: , Rfl:    cetirizine -pseudoephedrine (ZYRTEC -D) 5-120 MG tablet, Take 1 tablet by mouth daily as needed (sinus headaches.)., Disp: , Rfl:    clonazePAM  (KLONOPIN ) 0.5 MG tablet, Take 1 tablet (0.5 mg total) by mouth daily as needed for anxiety., Disp: 90 tablet, Rfl: 1   cyanocobalamin  (VITAMIN B12) 1000 MCG/ML injection, Inject 1 mL (1,000 mcg total) into the muscle every 30 (thirty) days., Disp: 10 mL, Rfl: 1   dorzolamide (TRUSOPT) 2 % ophthalmic solution, Place 1 drop into the right eye 2 (two) times daily., Disp: , Rfl:     DULoxetine  (CYMBALTA ) 20 MG capsule, Take 1 capsule (20 mg total) by mouth daily. (Patient not taking: Reported on 07/10/2024), Disp: 30 capsule, Rfl: 5   FLUoxetine  (PROZAC ) 20 MG capsule, Take 1 capsule (20 mg total) by mouth daily., Disp: 90 capsule, Rfl: 3   fluticasone (FLONASE) 50 MCG/ACT nasal spray, Place 1-2 sprays into both nostrils daily as needed for allergies or rhinitis., Disp: , Rfl:    furosemide  (LASIX ) 20 MG tablet, Take 1 tablet (20 mg total) by mouth as needed for fluid., Disp: 30 tablet, Rfl: 11   gabapentin  (NEURONTIN ) 100 MG capsule, Take 3 capsules (300 mg total) by mouth at bedtime., Disp: 90 capsule, Rfl: 11   Glycerin-Polysorbate 80 (REFRESH DRY EYE THERAPY OP), Place 1 drop into both eyes daily. (Patient not taking: Reported on 07/10/2024), Disp: , Rfl:    hydrocortisone  (PROCTOZONE -HC) 2.5 % rectal cream, Place 1 application rectally 2 (two) times daily as needed (Crohn's flare)., Disp: 28 g, Rfl: 2   ibuprofen (ADVIL) 200 MG tablet, Take 400-800 mg by mouth every 8 (eight) hours as needed (pain.)., Disp: , Rfl:    mesalamine (PENTASA) 250 MG CR capsule, Take 250-500 mg by mouth 4 (four) times daily as needed (crohn's disease flare ups)., Disp: , Rfl:    Multiple Vitamins-Minerals (ICAPS AREDS 2 PO), Take 1 tablet by mouth daily., Disp: , Rfl:    potassium chloride  (MICRO-K ) 10 MEQ CR capsule, Take 1 capsule (10 mEq total) by mouth daily  as needed (when taking furosemide )., Disp: 30 capsule, Rfl: 3   prednisoLONE acetate (PRED FORTE) 1 % ophthalmic suspension, Place 1 drop into both eyes 2 (two) times daily as needed (Uveitis)., Disp: , Rfl:    Probiotic Product (PROBIOTIC PO), Take 1 capsule by mouth 2 (two) times a week. As needed, Disp: , Rfl:    SYRINGE-NEEDLE, DISP, 3 ML (LUER LOCK SAFETY SYRINGES) 25G X 1 3 ML MISC, USE AS DIRECTED, Disp: 100 each, Rfl: 5   traMADol  (ULTRAM ) 50 MG tablet, Take 1 tablet (50 mg total) by mouth every 6 (six) hours as needed., Disp: 20  tablet, Rfl: 0   Treprostinil  (TYVASO  REFILL) 0.6 MG/ML SOLN, Inhale 54 mcg into the lungs in the morning, at noon, in the evening, and at bedtime. (Patient not taking: Reported on 07/10/2024), Disp: 87 mL, Rfl: 11   triamcinolone  cream (KENALOG ) 0.1 %, Apply 1 Application topically 2 (two) times daily. (Patient not taking: Reported on 07/10/2024), Disp: 30 g, Rfl: 0  Past Medical History: Past Medical History:  Diagnosis Date   Abnormal nuclear stress test    a. 07/2017: cath with no CAD   Anxiety    Aortic atherosclerosis (HCC)    Arthritis    Chronic diastolic CHF (congestive heart failure) (HCC)    Coronary artery calcification seen on CT scan    Crohn disease (HCC)    DVT (deep venous thrombosis) (HCC)    Dyspnea    Grade I diastolic dysfunction    Hyperlipidemia    ILD (interstitial lung disease) (HCC)    OSA on CPAP    CPAP    AND O2   Pernicious anemia    Polycythemia vera (HCC)    Pulmonary embolism (HCC) 2010   s/p hip surgery , reports this was never truly confirmed    Pulmonary hypertension (HCC)    Sleep disorder 01/10/2020   Uses klonopin  for sleep since 2014; sleep walking. And anxiety    Tobacco Use: Social History   Tobacco Use  Smoking Status Former   Current packs/day: 0.00   Average packs/day: 1 pack/day for 45.0 years (45.0 ttl pk-yrs)   Types: Cigarettes   Start date: 12/27/1963   Quit date: 12/26/2008   Years since quitting: 15.6  Smokeless Tobacco Never    Labs: Review Flowsheet  More data exists      Latest Ref Rng & Units 01/31/2022 11/25/2022 11/14/2023 12/04/2023 03/04/2024  Labs for ITP Cardiac and Pulmonary Rehab  Cholestrol 0 - 200 mg/dL - 809  - 796  -  LDL (calc) 0 - 99 mg/dL - 882  - 868  -  HDL-C >39.00 mg/dL - 44.29  - 43.69  -  Trlycerides 0.0 - 149.0 mg/dL - 14.9  - 21.9  -  Hemoglobin A1c 4.0 - 5.6 % 6.2  6.8  6.8  - 6.1     Capillary Blood Glucose: Lab Results  Component Value Date   GLUCAP 123 (H) 01/28/2021     Pulmonary  Assessment Scores:  Pulmonary Assessment Scores     Row Name 07/10/24 1356         ADL UCSD   ADL Phase Entry     SOB Score total 38       CAT Score   CAT Score 14       mMRC Score   mMRC Score 4       UCSD: Self-administered rating of dyspnea associated with activities of daily living (ADLs) 6-point scale (0 =  not at all to 5 = maximal or unable to do because of breathlessness)  Scoring Scores range from 0 to 120.  Minimally important difference is 5 units  CAT: CAT can identify the health impairment of COPD patients and is better correlated with disease progression.  CAT has a scoring range of zero to 40. The CAT score is classified into four groups of low (less than 10), medium (10 - 20), high (21-30) and very high (31-40) based on the impact level of disease on health status. A CAT score over 10 suggests significant symptoms.  A worsening CAT score could be explained by an exacerbation, poor medication adherence, poor inhaler technique, or progression of COPD or comorbid conditions.  CAT MCID is 2 points  mMRC: mMRC (Modified Medical Research Council) Dyspnea Scale is used to assess the degree of baseline functional disability in patients of respiratory disease due to dyspnea. No minimal important difference is established. A decrease in score of 1 point or greater is considered a positive change.   Pulmonary Function Assessment:  Pulmonary Function Assessment - 07/10/24 1345       Breath   Bilateral Breath Sounds Decreased    Shortness of Breath Yes;Fear of Shortness of Breath;Limiting activity          Exercise Target Goals: Exercise Program Goal: Individual exercise prescription set using results from initial 6 min walk test and THRR while considering  patient's activity barriers and safety.   Exercise Prescription Goal: Initial exercise prescription builds to 30-45 minutes a day of aerobic activity, 2-3 days per week.  Home exercise guidelines will be  given to patient during program as part of exercise prescription that the participant will acknowledge.  Activity Barriers & Risk Stratification:  Activity Barriers & Cardiac Risk Stratification - 07/10/24 1343       Activity Barriers & Cardiac Risk Stratification   Activity Barriers Deconditioning;Muscular Weakness;Shortness of Breath;Other (comment)    Comments leg and foot pain          6 Minute Walk:  6 Minute Walk     Row Name 07/10/24 1503         6 Minute Walk   Phase Initial     Distance 530 feet     Walk Time 6 minutes     # of Rest Breaks 3  1:34-2:30, 3:20-4:29, 5:46-6     MPH 1     METS 1.18     RPE 17     Perceived Dyspnea  4     VO2 Peak 4.13     Symptoms Yes (comment)     Comments lightheadedness     Resting HR 87 bpm     Resting BP 138/80     Resting Oxygen  Saturation  95 %  2L     Exercise Oxygen  Saturation  during 6 min walk 84 %  3L, ultimately increasing to 8L     Max Ex. HR 137 bpm     Max Ex. BP 218/80     2 Minute Post BP 182/80       Interval HR   1 Minute HR 124     2 Minute HR 111     3 Minute HR 120     4 Minute HR 121     5 Minute HR 126     6 Minute HR 122     2 Minute Post HR 95     Interval Heart Rate? Yes  Interval Oxygen    Interval Oxygen ? Yes     Baseline Oxygen  Saturation % 95 %  2L     1 Minute Oxygen  Saturation % 88 %  titrated to 3L     1 Minute Liters of Oxygen  3 L     2 Minute Oxygen  Saturation % 91 %  1:34 dropped to 84%, O2 up to 4L     2 Minute Liters of Oxygen  4 L     3 Minute Oxygen  Saturation % 91 %     3 Minute Liters of Oxygen  4 L     4 Minute Oxygen  Saturation % 93 %  3:20 O2 dropped to 88%, O2 titrated to 6L     4 Minute Liters of Oxygen  6 L     5 Minute Oxygen  Saturation % 91 %     5 Minute Liters of Oxygen  6 L     6 Minute Oxygen  Saturation % 88 %  O2 titrated to 8L     6 Minute Liters of Oxygen  8 L     2 Minute Post Oxygen  Saturation % 98 %     2 Minute Post Liters of Oxygen  6 L         Oxygen  Initial Assessment:  Oxygen  Initial Assessment - 07/10/24 1343       Home Oxygen    Home Oxygen  Device Home Concentrator;E-Tanks    Sleep Oxygen  Prescription Continuous    Liters per minute 2    Home Exercise Oxygen  Prescription Continuous    Liters per minute 5   pulsed   Home Resting Oxygen  Prescription Continuous    Liters per minute 3    Compliance with Home Oxygen  Use Yes      Initial 6 min Walk   Oxygen  Used Continuous    Liters per minute 8      Program Oxygen  Prescription   Program Oxygen  Prescription Continuous    Liters per minute 8      Intervention   Short Term Goals To learn and exhibit compliance with exercise, home and travel O2 prescription;To learn and understand importance of maintaining oxygen  saturations>88%;To learn and demonstrate proper pursed lip breathing techniques or other breathing techniques. ;To learn and understand importance of monitoring SPO2 with pulse oximeter and demonstrate accurate use of the pulse oximeter.;To learn and demonstrate proper use of respiratory medications    Long  Term Goals Exhibits compliance with exercise, home  and travel O2 prescription;Maintenance of O2 saturations>88%;Compliance with respiratory medication;Demonstrates proper use of MDI's;Exhibits proper breathing techniques, such as pursed lip breathing or other method taught during program session;Verbalizes importance of monitoring SPO2 with pulse oximeter and return demonstration          Oxygen  Re-Evaluation:  Oxygen  Re-Evaluation     Row Name 07/25/24 0840 08/27/24 0819           Program Oxygen  Prescription   Program Oxygen  Prescription Continuous Continuous      Liters per minute 8 8      Comments using 6-8L with exercise using 6-8L with exercise        Home Oxygen    Home Oxygen  Device Home Concentrator;E-Tanks Home Concentrator;E-Tanks      Sleep Oxygen  Prescription Continuous Continuous      Liters per minute 2 2      Home Exercise Oxygen   Prescription Continuous Continuous      Liters per minute 5  pulsed 5  pulsed      Home Resting Oxygen  Prescription Continuous Continuous  Liters per minute 3 3      Compliance with Home Oxygen  Use Yes Yes        Goals/Expected Outcomes   Short Term Goals To learn and exhibit compliance with exercise, home and travel O2 prescription;To learn and understand importance of maintaining oxygen  saturations>88%;To learn and demonstrate proper pursed lip breathing techniques or other breathing techniques. ;To learn and understand importance of monitoring SPO2 with pulse oximeter and demonstrate accurate use of the pulse oximeter.;To learn and demonstrate proper use of respiratory medications To learn and exhibit compliance with exercise, home and travel O2 prescription;To learn and understand importance of maintaining oxygen  saturations>88%;To learn and demonstrate proper pursed lip breathing techniques or other breathing techniques. ;To learn and understand importance of monitoring SPO2 with pulse oximeter and demonstrate accurate use of the pulse oximeter.;To learn and demonstrate proper use of respiratory medications      Long  Term Goals Exhibits compliance with exercise, home  and travel O2 prescription;Maintenance of O2 saturations>88%;Compliance with respiratory medication;Demonstrates proper use of MDI's;Exhibits proper breathing techniques, such as pursed lip breathing or other method taught during program session;Verbalizes importance of monitoring SPO2 with pulse oximeter and return demonstration Exhibits compliance with exercise, home  and travel O2 prescription;Maintenance of O2 saturations>88%;Compliance with respiratory medication;Demonstrates proper use of MDI's;Exhibits proper breathing techniques, such as pursed lip breathing or other method taught during program session;Verbalizes importance of monitoring SPO2 with pulse oximeter and return demonstration      Comments 6-8L 6-8L       Goals/Expected Outcomes Compliance and understanding of oxygen  saturation monitoring and breathing techniques to decrease shortness of breath. Compliance and understanding of oxygen  saturation monitoring and breathing techniques to decrease shortness of breath.         Oxygen  Discharge (Final Oxygen  Re-Evaluation):  Oxygen  Re-Evaluation - 08/27/24 0819       Program Oxygen  Prescription   Program Oxygen  Prescription Continuous    Liters per minute 8    Comments using 6-8L with exercise      Home Oxygen    Home Oxygen  Device Home Concentrator;E-Tanks    Sleep Oxygen  Prescription Continuous    Liters per minute 2    Home Exercise Oxygen  Prescription Continuous    Liters per minute 5   pulsed   Home Resting Oxygen  Prescription Continuous    Liters per minute 3    Compliance with Home Oxygen  Use Yes      Goals/Expected Outcomes   Short Term Goals To learn and exhibit compliance with exercise, home and travel O2 prescription;To learn and understand importance of maintaining oxygen  saturations>88%;To learn and demonstrate proper pursed lip breathing techniques or other breathing techniques. ;To learn and understand importance of monitoring SPO2 with pulse oximeter and demonstrate accurate use of the pulse oximeter.;To learn and demonstrate proper use of respiratory medications    Long  Term Goals Exhibits compliance with exercise, home  and travel O2 prescription;Maintenance of O2 saturations>88%;Compliance with respiratory medication;Demonstrates proper use of MDI's;Exhibits proper breathing techniques, such as pursed lip breathing or other method taught during program session;Verbalizes importance of monitoring SPO2 with pulse oximeter and return demonstration    Comments 6-8L    Goals/Expected Outcomes Compliance and understanding of oxygen  saturation monitoring and breathing techniques to decrease shortness of breath.          Initial Exercise Prescription:   Perform Capillary Blood  Glucose checks as needed.  Exercise Prescription Changes:   Exercise Prescription Changes     Row Name 07/16/24 1500 07/30/24  1500 08/13/24 1500 08/27/24 1500       Response to Exercise   Blood Pressure (Admit) 128/72 120/76 122/72 142/70    Blood Pressure (Exercise) 186/90 184/80 184/88 160/82    Blood Pressure (Exit) 128/62 124/72 144/82 128/66    Heart Rate (Admit) 104 bpm 89 bpm 83 bpm 90 bpm    Heart Rate (Exercise) 151 bpm 122 bpm 130 bpm 126 bpm    Heart Rate (Exit) 114 bpm 98 bpm 93 bpm 102 bpm    Oxygen  Saturation (Admit) 96 %  8L 97 %  6L cont 97 %  6L 96 %    Oxygen  Saturation (Exercise) 88 %  8L 93 %  8L cont 91 %  8L 96 %    Oxygen  Saturation (Exit) 93 %  RA 94 %  6L pulsed O2 97 %  8L 93 %    Rating of Perceived Exertion (Exercise) 14 13 13 11     Perceived Dyspnea (Exercise) 3 1 3 1     Duration Continue with 30 min of aerobic exercise without signs/symptoms of physical distress. Continue with 30 min of aerobic exercise without signs/symptoms of physical distress. Continue with 30 min of aerobic exercise without signs/symptoms of physical distress. Continue with 30 min of aerobic exercise without signs/symptoms of physical distress.    Intensity Other (comment)  will contact MD for an increase in THR THRR New  THR increased to 160 THRR unchanged THRR unchanged      Progression   Progression Continue to progress workloads to maintain intensity without signs/symptoms of physical distress. Continue to progress workloads to maintain intensity without signs/symptoms of physical distress. Continue to progress workloads to maintain intensity without signs/symptoms of physical distress. Continue to progress workloads to maintain intensity without signs/symptoms of physical distress.      Resistance Training   Weight red bands red bands red bands blue bands    Reps 10-15 10-15 10-15 10-15    Time 10 Minutes 10 Minutes 10 Minutes 10 Minutes      Interval Training   Interval  Training -- -- No No      Oxygen    Oxygen  Continuous Continuous Continuous Continuous    Liters 8-10 8 6-8 6-8      NuStep   Level 1 3 3 3     SPM 103 104 139 168    Minutes 15 15 15 15     METs 2.1 2 2.5 2.4      Track   Laps 5 8 14   147ft track 15    Minutes 15 15 15 15     METs 1.62 2 2.46 2.87      Oxygen    Maintain Oxygen  Saturation 88% or higher -- 88% or higher  >90% 88% or higher       Exercise Comments:   Exercise Goals and Review:   Exercise Goals     Row Name 07/10/24 1343             Exercise Goals   Increase Physical Activity Yes       Intervention Provide advice, education, support and counseling about physical activity/exercise needs.;Develop an individualized exercise prescription for aerobic and resistive training based on initial evaluation findings, risk stratification, comorbidities and participant's personal goals.       Expected Outcomes Short Term: Attend rehab on a regular basis to increase amount of physical activity.;Long Term: Exercising regularly at least 3-5 days a week.;Long Term: Add in home exercise to make exercise part of routine and  to increase amount of physical activity.       Increase Strength and Stamina Yes       Intervention Provide advice, education, support and counseling about physical activity/exercise needs.;Develop an individualized exercise prescription for aerobic and resistive training based on initial evaluation findings, risk stratification, comorbidities and participant's personal goals.       Expected Outcomes Short Term: Increase workloads from initial exercise prescription for resistance, speed, and METs.;Short Term: Perform resistance training exercises routinely during rehab and add in resistance training at home;Long Term: Improve cardiorespiratory fitness, muscular endurance and strength as measured by increased METs and functional capacity ( )       Able to understand and use rate of perceived exertion (RPE) scale  Yes       Intervention Provide education and explanation on how to use RPE scale       Expected Outcomes Long Term:  Able to use RPE to guide intensity level when exercising independently;Short Term: Able to use RPE daily in rehab to express subjective intensity level       Able to understand and use Dyspnea scale Yes       Intervention Provide education and explanation on how to use Dyspnea scale       Expected Outcomes Short Term: Able to use Dyspnea scale daily in rehab to express subjective sense of shortness of breath during exertion;Long Term: Able to use Dyspnea scale to guide intensity level when exercising independently       Knowledge and understanding of Target Heart Rate Range (THRR) Yes       Intervention Provide education and explanation of THRR including how the numbers were predicted and where they are located for reference       Expected Outcomes Short Term: Able to state/look up THRR;Short Term: Able to use daily as guideline for intensity in rehab;Long Term: Able to use THRR to govern intensity when exercising independently       Understanding of Exercise Prescription Yes       Intervention Provide education, explanation, and written materials on patient's individual exercise prescription       Expected Outcomes Short Term: Able to explain program exercise prescription;Long Term: Able to explain home exercise prescription to exercise independently          Exercise Goals Re-Evaluation :  Exercise Goals Re-Evaluation     Row Name 07/25/24 0836 08/27/24 0817           Exercise Goal Re-Evaluation   Exercise Goals Review Increase Physical Activity;Able to understand and use Dyspnea scale;Understanding of Exercise Prescription;Increase Strength and Stamina;Knowledge and understanding of Target Heart Rate Range (THRR);Able to understand and use rate of perceived exertion (RPE) scale Increase Physical Activity;Able to understand and use Dyspnea scale;Understanding of Exercise  Prescription;Increase Strength and Stamina;Knowledge and understanding of Target Heart Rate Range (THRR);Able to understand and use rate of perceived exertion (RPE) scale      Comments Pt has completed 3 exercise sessions with good attendance. She is exercising on the recumbent stepper for 15 min, level 1, METs 1.9-2.1. Will increase to level 2 this next session. She then is walking the track with the gocart for 15 min, METs 2.5. She performs warm up and cool down standing, squats supported. Using red bands. Will progress as tolerated. Pt has completed 12 exercise sessions with good attendance, very motivated. She is exercising on the recumbent stepper for 15 min, level 3, METs 2.6.  She then is walking the track with the gocart  for 15 min, METs 2.62. She performs warm up and cool down standing, squats supported. Using blue bands, 5.8 lbs. Will progress as tolerated.      Expected Outcomes Through exercise at rehab and at home, patient will increase physical capacity and ADL's will be easier to perform. The patient will also feel comfortable establishing a home exercise program. Through exercise at rehab and at home, patient will increase physical capacity and ADL's will be easier to perform. The patient will also feel comfortable establishing a home exercise program.         Discharge Exercise Prescription (Final Exercise Prescription Changes):  Exercise Prescription Changes - 08/27/24 1500       Response to Exercise   Blood Pressure (Admit) 142/70    Blood Pressure (Exercise) 160/82    Blood Pressure (Exit) 128/66    Heart Rate (Admit) 90 bpm    Heart Rate (Exercise) 126 bpm    Heart Rate (Exit) 102 bpm    Oxygen  Saturation (Admit) 96 %    Oxygen  Saturation (Exercise) 96 %    Oxygen  Saturation (Exit) 93 %    Rating of Perceived Exertion (Exercise) 11    Perceived Dyspnea (Exercise) 1    Duration Continue with 30 min of aerobic exercise without signs/symptoms of physical distress.     Intensity THRR unchanged      Progression   Progression Continue to progress workloads to maintain intensity without signs/symptoms of physical distress.      Resistance Training   Weight blue bands    Reps 10-15    Time 10 Minutes      Interval Training   Interval Training No      Oxygen    Oxygen  Continuous    Liters 6-8      NuStep   Level 3    SPM 168    Minutes 15    METs 2.4      Track   Laps 15    Minutes 15    METs 2.87      Oxygen    Maintain Oxygen  Saturation 88% or higher          Nutrition:  Target Goals: Understanding of nutrition guidelines, daily intake of sodium 1500mg , cholesterol 200mg , calories 30% from fat and 7% or less from saturated fats, daily to have 5 or more servings of fruits and vegetables.  Biometrics:  Pre Biometrics - 07/10/24 1326       Pre Biometrics   Grip Strength 28 kg   old dynomometer          Nutrition Therapy Plan and Nutrition Goals:  Nutrition Therapy & Goals - 08/20/24 1414       Nutrition Therapy   Diet Heart Healthy Diet      Personal Nutrition Goals   Nutrition Goal Patient to improve diet quality by using the plate method as a guide for meal planning to include lean protein/plant protein, fruits, vegetables, whole grains, nonfat dairy as part of a well-balanced diet.    Comments Goals in progress. Patient has medical history of pulmonary HTN, OSA, hyperlipidemia, Dm2, chrohn's disease, pernicious anemia, morbid obesity. A1c is well controlled in a prediabetic range. LDL is not at goal; zetia was previously recommended as statin was declined but not currently prescribed. She reports following a high protein diet. She has maintained her weight since starting with our program. Patient will benefit from participation in intensive cardiac rehab for nutrition education, exercise, and lifestyle modification.      Intervention  Plan   Intervention Nutrition handout(s) given to patient.;Prescribe, educate and counsel  regarding individualized specific dietary modifications aiming towards targeted core components such as weight, hypertension, lipid management, diabetes, heart failure and other comorbidities.    Expected Outcomes Short Term Goal: Understand basic principles of dietary content, such as calories, fat, sodium, cholesterol and nutrients.;Long Term Goal: Adherence to prescribed nutrition plan.          Nutrition Assessments:  Nutrition Assessments - 07/17/24 0913       Rate Your Plate Scores   Pre Score 76         MEDIFICTS Score Key: >=70 Need to make dietary changes  40-70 Heart Healthy Diet <= 40 Therapeutic Level Cholesterol Diet  Flowsheet Row PULMONARY REHAB OTHER RESPIRATORY from 07/16/2024 in [facility] Heart, Vascular, & Lung Health  Picture Your Plate Total Score on Admission 76   Picture Your Plate Scores: <59 Unhealthy dietary pattern with much room for improvement. 41-50 Dietary pattern unlikely to meet recommendations for good health and room for improvement. 51-60 More healthful dietary pattern, with some room for improvement.  >60 Healthy dietary pattern, although there may be some specific behaviors that could be improved.    Nutrition Goals Re-Evaluation:  Nutrition Goals Re-Evaluation     Row Name 07/17/24 1626 08/20/24 1414           Goals   Current Weight 271 lb 13.2 oz (123.3 kg) 274 lb 11.1 oz (124.6 kg)      Comment A1c 6.1, cholesterol 203, LDL 131 A1c 6.1, cholesterol 203, LDL 131      Expected Outcome Patient has medical history of pulmonary HTN, OSA, hyperlipidemia, Dm2, chrohn's disease, pernicious anemia, morbid obesity. A1c is well controlled in a prediabetic range. LDL is not at goal; zetia was previously recommended as statin was declined but not currently prescribed. Patient will benefit from participation in intensive cardiac rehab for nutrition education, exercise, and lifestyle modification. Goals in progress.  Patient has medical history of pulmonary HTN, OSA, hyperlipidemia, Dm2, chrohn's disease, pernicious anemia, morbid obesity. A1c is well controlled in a prediabetic range. LDL is not at goal; zetia was previously recommended as statin was declined but not currently prescribed. She reports following a high protein diet. She has maintained her weight since starting with our program. Patient will benefit from participation in intensive cardiac rehab for nutrition education, exercise, and lifestyle modification.         Nutrition Goals Discharge (Final Nutrition Goals Re-Evaluation):  Nutrition Goals Re-Evaluation - 08/20/24 1414       Goals   Current Weight 274 lb 11.1 oz (124.6 kg)    Comment A1c 6.1, cholesterol 203, LDL 131    Expected Outcome Goals in progress. Patient has medical history of pulmonary HTN, OSA, hyperlipidemia, Dm2, chrohn's disease, pernicious anemia, morbid obesity. A1c is well controlled in a prediabetic range. LDL is not at goal; zetia was previously recommended as statin was declined but not currently prescribed. She reports following a high protein diet. She has maintained her weight since starting with our program. Patient will benefit from participation in intensive cardiac rehab for nutrition education, exercise, and lifestyle modification.          Psychosocial: Target Goals: Acknowledge presence or absence of significant depression and/or stress, maximize coping skills, provide positive support system. Participant is able to verbalize types and ability to use techniques and skills needed for reducing stress and depression.  Initial Review & Psychosocial Screening:  Initial Psych Review & Screening - 07/10/24 1345       Initial Review   Current issues with Current Anxiety/Panic;History of Depression;Current Psychotropic Meds      Family Dynamics   Good Support System? Yes      Barriers   Psychosocial barriers to participate in program There are no  identifiable barriers or psychosocial needs.      Screening Interventions   Interventions Provide feedback about the scores to participant    Expected Outcomes Short Term goal: Utilizing psychosocial counselor, staff and physician to assist with identification of specific Stressors or current issues interfering with healing process. Setting desired goal for each stressor or current issue identified.;Long Term Goal: Stressors or current issues are controlled or eliminated.;Short Term goal: Identification and review with participant of any Quality of Life or Depression concerns found by scoring the questionnaire.;Long Term goal: The participant improves quality of Life and PHQ9 Scores as seen by post scores and/or verbalization of changes          Quality of Life Scores:  Scores of 19 and below usually indicate a poorer quality of life in these areas.  A difference of  2-3 points is a clinically meaningful difference.  A difference of 2-3 points in the total score of the Quality of Life Index has been associated with significant improvement in overall quality of life, self-image, physical symptoms, and general health in studies assessing change in quality of life.  PHQ-9: Review Flowsheet  More data exists      07/10/2024 05/29/2024 03/04/2024 12/04/2023 11/25/2022  Depression screen PHQ 2/9  Decreased Interest 0 0 0 0 0  Down, Depressed, Hopeless 0 0 0 0 0  PHQ - 2 Score 0 0 0 0 0  Altered sleeping 1 - - - -  Tired, decreased energy 1 - - - -  Change in appetite 0 - - - -  Feeling bad or failure about yourself  0 - - - -  Trouble concentrating 1 - - - -  Moving slowly or fidgety/restless 0 - - - -  Suicidal thoughts 0 - - - -  PHQ-9 Score 3 - - - -  Difficult doing work/chores Not difficult at all - - - -   Interpretation of Total Score  Total Score Depression Severity:  1-4 = Minimal depression, 5-9 = Mild depression, 10-14 = Moderate depression, 15-19 = Moderately severe depression,  20-27 = Severe depression   Psychosocial Evaluation and Intervention:  Psychosocial Evaluation - 07/10/24 1346       Psychosocial Evaluation & Interventions   Interventions Encouraged to exercise with the program and follow exercise prescription    Comments Charles denies any psy/soc barriers or conerns at this time. She feels like her mental health is stable and is compliant with taking her psychotropic meds    Expected Outcomes For Marcellina to participate in PR without any psy/soc barriers or concerns    Continue Psychosocial Services  No Follow up required          Psychosocial Re-Evaluation:  Psychosocial Re-Evaluation     Row Name 07/26/24 1455 08/23/24 1127           Psychosocial Re-Evaluation   Current issues with Current Anxiety/Panic;History of Depression;Current Psychotropic Meds Current Anxiety/Panic;History of Depression;Current Psychotropic Meds      Comments Monthly psychosocial re-evaluation as follows: Shariyah denies any psy/soc barriers or concerns at this time. She feels like her mental health is stable and is compliant with taking her  psychotropic meds Monthly psychosocial re-evaluation as follows: Bindi continues to deny any psy/soc barriers or concerns at this time. She feels like her mental health is stable and is compliant with taking her psychotropic meds. She declines any resources, referrals or needs currently.      Expected Outcomes For Tatayana to participate in PR without any psy/soc barriers or concerns For Nikitha to participate in PR without any psy/soc barriers or concerns      Interventions Stress management education;Relaxation education Encouraged to attend Pulmonary Rehabilitation for the exercise      Continue Psychosocial Services  No Follow up required No Follow up required         Psychosocial Discharge (Final Psychosocial Re-Evaluation):  Psychosocial Re-Evaluation - 08/23/24 1127       Psychosocial Re-Evaluation   Current issues with Current  Anxiety/Panic;History of Depression;Current Psychotropic Meds    Comments Monthly psychosocial re-evaluation as follows: Devone continues to deny any psy/soc barriers or concerns at this time. She feels like her mental health is stable and is compliant with taking her psychotropic meds. She declines any resources, referrals or needs currently.    Expected Outcomes For Robinn to participate in PR without any psy/soc barriers or concerns    Interventions Encouraged to attend Pulmonary Rehabilitation for the exercise    Continue Psychosocial Services  No Follow up required          Education: Education Goals: Education classes will be provided on a weekly basis, covering required topics. Participant will state understanding/return demonstration of topics presented.  Learning Barriers/Preferences:  Learning Barriers/Preferences - 07/10/24 1348       Learning Barriers/Preferences   Learning Barriers None    Learning Preferences None          Education Topics: Know Your Numbers Group instruction that is supported by a PowerPoint presentation. Instructor discusses importance of knowing and understanding resting, exercise, and post-exercise oxygen  saturation, heart rate, and blood pressure. Oxygen  saturation, heart rate, blood pressure, rating of perceived exertion, and dyspnea are reviewed along with a normal range for these values.    Exercise for the Pulmonary Patient Group instruction that is supported by a PowerPoint presentation. Instructor discusses benefits of exercise, core components of exercise, frequency, duration, and intensity of an exercise routine, importance of utilizing pulse oximetry during exercise, safety while exercising, and options of places to exercise outside of rehab.    MET Level  Group instruction provided by PowerPoint, verbal discussion, and written material to support subject matter. Instructor reviews what METs are and how to increase METs.  Flowsheet Row  PULMONARY REHAB OTHER RESPIRATORY from 08/08/2024 in [facility] for Heart, Vascular, & Lung Health  Date 08/08/24  Educator EP  Instruction Review Code 1- Verbalizes Understanding    Pulmonary Medications Verbally interactive group education provided by instructor with focus on inhaled medications and proper administration.   Anatomy and Physiology of the Respiratory System Group instruction provided by PowerPoint, verbal discussion, and written material to support subject matter. Instructor reviews respiratory cycle and anatomical components of the respiratory system and their functions. Instructor also reviews differences in obstructive and restrictive respiratory diseases with examples of each.  Flowsheet Row PULMONARY REHAB OTHER RESPIRATORY from 08/22/2024 in [facility] for Heart, Vascular, & Lung Health  Date 08/22/24  Educator RT  Instruction Review Code 1- Verbalizes Understanding    Oxygen  Safety Group instruction provided by PowerPoint, verbal discussion, and written material to support subject matter. There is  an overview of "What is Oxygen " and "Why do we need it".  Instructor also reviews how to create a safe environment for oxygen  use, the importance of using oxygen  as prescribed, and the risks of noncompliance. There is a brief discussion on traveling with oxygen  and resources the patient may utilize. Flowsheet Row PULMONARY REHAB OTHER RESPIRATORY from 04/15/2021 in [facility] for Heart, Vascular, & Lung Health  Date 02/25/21  Educator Handout    Oxygen  Use Group instruction provided by PowerPoint, verbal discussion, and written material to discuss how supplemental oxygen  is prescribed and different types of oxygen  supply systems. Resources for more information are provided.    Breathing Techniques Group instruction that is supported by demonstration and informational handouts. Instructor discusses  the benefits of pursed lip and diaphragmatic breathing and detailed demonstration on how to perform both.     Risk Factor Reduction Group instruction that is supported by a PowerPoint presentation. Instructor discusses the definition of a risk factor, different risk factors for pulmonary disease, and how the heart and lungs work together.   Pulmonary Diseases Group instruction provided by PowerPoint, verbal discussion, and written material to support subject matter. Instructor gives an overview of the different type of pulmonary diseases. There is also a discussion on risk factors and symptoms as well as ways to manage the diseases. Flowsheet Row PULMONARY REHAB OTHER RESPIRATORY from 08/15/2024 in [facility] for Heart, Vascular, & Lung Health  Date 08/15/24  Educator RT  Instruction Review Code 1- Verbalizes Understanding    Stress and Energy Conservation Group instruction provided by PowerPoint, verbal discussion, and written material to support subject matter. Instructor gives an overview of stress and the impact it can have on the body. Instructor also reviews ways to reduce stress. There is also a discussion on energy conservation and ways to conserve energy throughout the day. Flowsheet Row PULMONARY REHAB OTHER RESPIRATORY from 07/18/2024 in [facility] for Heart, Vascular, & Lung Health  Date 07/18/24  Educator RN  Instruction Review Code 1- Verbalizes Understanding    Warning Signs and Symptoms Group instruction provided by PowerPoint, verbal discussion, and written material to support subject matter. Instructor reviews warning signs and symptoms of stroke, heart attack, cold and flu. Instructor also reviews ways to prevent the spread of infection. Flowsheet Row PULMONARY REHAB OTHER RESPIRATORY from 07/25/2024 in [facility] for Heart, Vascular, & Lung Health  Date 07/25/24  Educator RN  Instruction Review  Code 1- Verbalizes Understanding    Other Education Group or individual verbal, written, or video instructions that support the educational goals of the pulmonary rehab program. Flowsheet Row PULMONARY REHAB OTHER RESPIRATORY from 04/15/2021 in [facility] for Heart, Vascular, & Lung Health  Date 03/25/21  [Beat a Sedentary Lifestyle]  Educator handout     Knowledge Questionnaire Score:  Knowledge Questionnaire Score - 07/10/24 1520       Knowledge Questionnaire Score   Pre Score 17/18          Core Components/Risk Factors/Patient Goals at Admission:  Personal Goals and Risk Factors at Admission - 07/10/24 1349       Core Components/Risk Factors/Patient Goals on Admission    Weight Management Yes;Obesity;Weight Loss    Intervention Weight Management: Develop a combined nutrition and exercise program designed to reach desired caloric intake, while maintaining appropriate intake of nutrient and fiber, sodium and fats, and appropriate energy expenditure required for the weight  goal.;Weight Management: Provide education and appropriate resources to help participant work on and attain dietary goals.;Weight Management/Obesity: Establish reasonable short term and long term weight goals.;Obesity: Provide education and appropriate resources to help participant work on and attain dietary goals.    Admit Weight 274 lb 11.1 oz (124.6 kg)    Expected Outcomes Short Term: Continue to assess and modify interventions until short term weight is achieved;Long Term: Adherence to nutrition and physical activity/exercise program aimed toward attainment of established weight goal;Weight Maintenance: Understanding of the daily nutrition guidelines, which includes 25-35% calories from fat, 7% or less cal from saturated fats, less than 200mg  cholesterol, less than 1.5gm of sodium, & 5 or more servings of fruits and vegetables daily;Weight Loss: Understanding of general recommendations for  a balanced deficit meal plan, which promotes 1-2 lb weight loss per week and includes a negative energy balance of [phone] kcal/d;Understanding recommendations for meals to include 15-35% energy as protein, 25-35% energy from fat, 35-60% energy from carbohydrates, less than 200mg  of dietary cholesterol, 20-35 gm of total fiber daily;Understanding of distribution of calorie intake throughout the day with the consumption of 4-5 meals/snacks    Improve shortness of breath with ADL's Yes    Intervention Provide education, individualized exercise plan and daily activity instruction to help decrease symptoms of SOB with activities of daily living.    Expected Outcomes Short Term: Improve cardiorespiratory fitness to achieve a reduction of symptoms when performing ADLs;Long Term: Be able to perform more ADLs without symptoms or delay the onset of symptoms          Core Components/Risk Factors/Patient Goals Review:   Goals and Risk Factor Review     Row Name 07/26/24 1457 08/23/24 1128           Core Components/Risk Factors/Patient Goals Review   Personal Goals Review Weight Management/Obesity;Improve shortness of breath with ADL's;Develop more efficient breathing techniques such as purse lipped breathing and diaphragmatic breathing and practicing self-pacing with activity. Weight Management/Obesity;Improve shortness of breath with ADL's      Review Monthly review of patient's Core Components/Risk Factors/Patient Goals are as follows: Goal in progress for improving her shortness of breath with ADLs. Ruby needed to increase to 8L with exertion. She has been exercising on the Nu[employer] and walking the track. She has completed 4 sessions and is making great progress. She is motivated to exercise to achieve her goals. Goal met on developing more efficient breathing techniques such as purse lipped breathing and diaphragmatic breathing; and practicing self-pacing with activity. Tayllor can initiate pursed lip  breathing and is practicing diaphragmatic breathing before bed. She can self-pace when needed and allows herself to take breaks while walking or exercising. This is her 3rd time being in the program. Goal progressing on weight loss. Willadeen has been working with our dietician on ways to incorporate the plate method and choosing a wide variety of foods. She is working on decreasing her caloric intake as well as eating out less. She is down ~3.3#. We will continue to monitor Alvena's progress throughout the program. Monthly review of patient's Core Components/Risk Factors/Patient Goals are as follows: Goal in progress for improving her shortness of breath with ADLs. Kariana has maintained this month on 8L of oxygen  with exertion. She has been exercising on the NuStep and walking the track. She is motivated to exercise to achieve her goals. Goal progressing on weight loss. Moneka has been working with our dietician on ways to incorporate the plate method and choosing a  wide variety of foods. She is working on decreasing her caloric intake as well as eating out less. This month she has maintained her weight. We will continue to monitor Kennadi's progress throughout the program.      Expected Outcomes Pt will show progress toward meeting expected goals and outcomes. Pt will show progress toward meeting expected goals and outcomes.         Core Components/Risk Factors/Patient Goals at Discharge (Final Review):   Goals and Risk Factor Review - 08/23/24 1128       Core Components/Risk Factors/Patient Goals Review   Personal Goals Review Weight Management/Obesity;Improve shortness of breath with ADL's    Review Monthly review of patient's Core Components/Risk Factors/Patient Goals are as follows: Goal in progress for improving her shortness of breath with ADLs. Artemis has maintained this month on 8L of oxygen  with exertion. She has been exercising on the NuStep and walking the track. She is motivated to exercise to achieve  her goals. Goal progressing on weight loss. Aradia has been working with our dietician on ways to incorporate the plate method and choosing a wide variety of foods. She is working on decreasing her caloric intake as well as eating out less. This month she has maintained her weight. We will continue to monitor Mara's progress throughout the program.    Expected Outcomes Pt will show progress toward meeting expected goals and outcomes.          ITP Comments: Pt is making expected progress toward Pulmonary Rehab goals after completing 13 session(s). Recommend continued exercise, life style modification, education, and utilization of breathing techniques to increase stamina and strength, while also decreasing shortness of breath with exertion.  Dr. Slater Staff is Medical Director for Pulmonary Rehab at [facility].

## 2024-08-29 ENCOUNTER — Encounter (HOSPITAL_COMMUNITY)
Admission: RE | Admit: 2024-08-29 | Discharge: 2024-08-29 | Disposition: A | Discharge status: Home / Self Care | Source: Ambulatory Visit | Attending: Internal Medicine | Admitting: Internal Medicine

## 2024-08-29 DIAGNOSIS — J849 Interstitial pulmonary disease, unspecified: Secondary | ICD-10-CM

## 2024-08-29 DIAGNOSIS — J9611 Chronic respiratory failure with hypoxia: Secondary | ICD-10-CM

## 2024-08-29 NOTE — Progress Notes (Signed)
 Daily Session Note  Patient Details  Name: Patty [name] MRN: [id number] Date of Birth: 1951-02-24 Referring Provider:   Conrad Ports Pulmonary Rehab Walk Test from 02/15/2021 in [facility] for Heart, Vascular, & Lung Health  Referring Provider Dr. Geronimo    Encounter Date: 08/29/2024  Check In:  Session Check In - 08/29/24 1352       Check-In   Supervising physician immediately available to respond to emergencies [facility] MD immediately available    Physician(s) Barnie Hila, NP    Location [facility]    Staff Present Johnnie Moats, MS, ACSM-CEP, Exercise Physiologist;Casey Claudene Candia Levin, RN, BSN;Randi Reeve BS, ACSM-CEP, Exercise Physiologist;Bailey Elnor, MS, Exercise Physiologist    Virtual Visit No    Medication changes reported     No    Fall or balance concerns reported    No    Tobacco Cessation No Change    Warm-up and Cool-down Performed as group-led instruction    Resistance Training Performed Yes    VAD Patient? No    PAD/SET Patient? No      Pain Assessment   Currently in Pain? No/denies    Multiple Pain Sites No          Capillary Blood Glucose: No results found for this or any previous visit (from the past 24 hours).    Social History   Tobacco Use  Smoking Status Former   Current packs/day: 0.00   Average packs/day: 1 pack/day for 45.0 years (45.0 ttl pk-yrs)   Types: Cigarettes   Start date: 12/27/1963   Quit date: 12/26/2008   Years since quitting: 15.6  Smokeless Tobacco Never    Goals Met:  Independence with exercise equipment Exercise tolerated well No report of concerns or symptoms today Strength training completed today  Goals Unmet:  Not Applicable  Comments: Service time is from 1312 to 1448    Dr. Slater Staff is Medical Director for Pulmonary Rehab at [facility].

## 2024-09-03 ENCOUNTER — Encounter (HOSPITAL_COMMUNITY)
Admission: RE | Admit: 2024-09-03 | Discharge: 2024-09-03 | Disposition: A | Discharge status: Home / Self Care | Source: Ambulatory Visit | Attending: Internal Medicine | Admitting: Internal Medicine

## 2024-09-03 DIAGNOSIS — J9611 Chronic respiratory failure with hypoxia: Secondary | ICD-10-CM

## 2024-09-03 DIAGNOSIS — J849 Interstitial pulmonary disease, unspecified: Secondary | ICD-10-CM | POA: Diagnosis not present

## 2024-09-03 NOTE — Progress Notes (Signed)
 Daily Session Note  Patient Details  Name: Patty [name] MRN: [id number] Date of Birth: April 13, 1951 Referring Provider:   Conrad Ports Pulmonary Rehab Walk Test from 02/15/2021 in [facility] for Heart, Vascular, & Lung Health  Referring Provider Dr. Geronimo    Encounter Date: 09/03/2024  Check In:  Session Check In - 09/03/24 1419       Check-In   Supervising physician immediately available to respond to emergencies [facility] MD immediately available    Physician(s) Josefa Moots, NP    Location [facility]    Staff Present Johnnie Moats, MS, ACSM-CEP, Exercise Physiologist;Casey Claudene Candia Levin, RN, BSN;Dakayla Disanti BS, ACSM-CEP, Exercise Physiologist    Virtual Visit No    Medication changes reported     No    Fall or balance concerns reported    No    Tobacco Cessation No Change    Warm-up and Cool-down Performed as group-led instruction    Resistance Training Performed Yes    VAD Patient? No    PAD/SET Patient? No      Pain Assessment   Currently in Pain? No/denies    Multiple Pain Sites No          Capillary Blood Glucose: No results found for this or any previous visit (from the past 24 hours).    Social History   Tobacco Use  Smoking Status Former   Current packs/day: 0.00   Average packs/day: 1 pack/day for 45.0 years (45.0 ttl pk-yrs)   Types: Cigarettes   Start date: 12/27/1963   Quit date: 12/26/2008   Years since quitting: 15.6  Smokeless Tobacco Never    Goals Met:  Independence with exercise equipment Exercise tolerated well No report of concerns or symptoms today Strength training completed today  Goals Unmet:  Not Applicable  Comments: Service time is from 1322 to 1450.    Dr. Slater Staff is Medical Director for Pulmonary Rehab at [facility].

## 2024-09-04 ENCOUNTER — Ambulatory Visit: Admitting: Family Medicine

## 2024-09-05 ENCOUNTER — Encounter (HOSPITAL_COMMUNITY)
Admission: RE | Admit: 2024-09-05 | Discharge: 2024-09-05 | Disposition: A | Discharge status: Home / Self Care | Source: Ambulatory Visit | Attending: Internal Medicine | Admitting: Internal Medicine

## 2024-09-05 ENCOUNTER — Telehealth: Payer: Self-pay | Admitting: Internal Medicine

## 2024-09-05 DIAGNOSIS — J9611 Chronic respiratory failure with hypoxia: Secondary | ICD-10-CM

## 2024-09-05 DIAGNOSIS — J849 Interstitial pulmonary disease, unspecified: Secondary | ICD-10-CM | POA: Diagnosis not present

## 2024-09-05 NOTE — Progress Notes (Signed)
 Daily Session Note  Patient Details  Name: Patty [name] MRN: [id number] Date of Birth: 1951-11-23 Referring Provider:   Conrad Ports Pulmonary Rehab Walk Test from 02/15/2021 in [facility] for Heart, Vascular, & Lung Health  Referring Provider Dr. Geronimo    Encounter Date: 09/05/2024  Check In:  Session Check In - 09/05/24 1353       Check-In   Supervising physician immediately available to respond to emergencies [facility] MD immediately available    Physician(s) Orren Fabry, PA    [facility]    Staff Present Johnnie Moats, MS, ACSM-CEP, Exercise Physiologist;Riyad Keena Claudene Candia Levin, RN, BSN;Randi Reeve BS, ACSM-CEP, Exercise Physiologist    Virtual Visit No    Medication changes reported     No    Fall or balance concerns reported    No    Tobacco Cessation No Change    Warm-up and Cool-down Performed as group-led instruction    Resistance Training Performed Yes    VAD Patient? No    PAD/SET Patient? No      Pain Assessment   Currently in Pain? No/denies    Multiple Pain Sites No          Capillary Blood Glucose: No results found for this or any previous visit (from the past 24 hours).    Social History   Tobacco Use  Smoking Status Former   Current packs/day: 0.00   Average packs/day: 1 pack/day for 45.0 years (45.0 ttl pk-yrs)   Types: Cigarettes   Start date: 12/27/1963   Quit date: 12/26/2008   Years since quitting: 15.7  Smokeless Tobacco Never    Goals Met:  Proper associated with RPD/PD & O2 Sat Independence with exercise equipment Exercise tolerated well No report of concerns or symptoms today Strength training completed today  Goals Unmet:  Not Applicable  Comments: Service time is from 1313 to 1445.    Dr. Slater Staff is Medical Director for Pulmonary Rehab at [facility].

## 2024-09-05 NOTE — Telephone Encounter (Signed)
 They are going to fill the order. NFN

## 2024-09-05 NOTE — Telephone Encounter (Signed)
 I have sent this info to the DME and will inform once I hear back

## 2024-09-05 NOTE — Telephone Encounter (Signed)
 Pt has a pending appt for 10/2024. Will this suffice? It this for a re cert or new O2?

## 2024-09-05 NOTE — Telephone Encounter (Signed)
 I have asked Patty [name] at [facility] if it will suffice. Will inform when I head back

## 2024-09-05 NOTE — Telephone Encounter (Signed)
 A recert

## 2024-09-05 NOTE — Telephone Encounter (Signed)
 Thank you. Can you ask [facility] for Nov appointment will suffice?

## 2024-09-05 NOTE — Telephone Encounter (Signed)
 Per Avelina at [employer] [facility]-  Our reports are showing we need updated sats on this patient.

## 2024-09-10 ENCOUNTER — Encounter (HOSPITAL_COMMUNITY)
Admission: RE | Admit: 2024-09-10 | Discharge: 2024-09-10 | Disposition: A | Discharge status: Home / Self Care | Source: Ambulatory Visit | Attending: Internal Medicine | Admitting: Internal Medicine

## 2024-09-10 VITALS — Wt 272.5 lb

## 2024-09-10 DIAGNOSIS — J849 Interstitial pulmonary disease, unspecified: Secondary | ICD-10-CM

## 2024-09-10 DIAGNOSIS — J9611 Chronic respiratory failure with hypoxia: Secondary | ICD-10-CM

## 2024-09-10 NOTE — Progress Notes (Signed)
 Daily Session Note  Patient Details  Name: Patty [name] MRN: [id number] Date of Birth: 1951/12/20 Referring Provider:   Conrad Ports Pulmonary Rehab Walk Test from 02/15/2021 in [facility] for Heart, Vascular, & Lung Health  Referring Provider Dr. Geronimo    Encounter Date: 09/10/2024  Check In:  Session Check In - 09/10/24 1329       Check-In   Supervising physician immediately available to respond to emergencies [facility] immediately available    Physician(s) Rosabel Mose, NP    [facility]    Staff Present Johnnie Moats, MS, ACSM-CEP, Exercise Physiologist;Casey Claudene Candia Levin, RN, BSN;Randi Midge BS, ACSM-CEP, Exercise Physiologist;Johnny Fayette, MS, Exercise Physiologist    Virtual Visit No    Medication changes reported     No    Fall or balance concerns reported    No    Tobacco Cessation No Change    Warm-up and Cool-down Performed as group-led instruction    Resistance Training Performed Yes    VAD Patient? No    PAD/SET Patient? No      Pain Assessment   Currently in Pain? No/denies    Multiple Pain Sites No          Capillary Blood Glucose: No results found for this or any previous visit (from the past 24 hours).   Exercise Prescription Changes - 09/10/24 1500       Response to Exercise   Blood Pressure (Admit) 134/80    Blood Pressure (Exercise) 172/66    Blood Pressure (Exit) 122/70    Heart Rate (Admit) 95 bpm    Heart Rate (Exercise) 129 bpm    Heart Rate (Exit) 1.74 bpm    Oxygen  Saturation (Admit) 97 %   6L   Oxygen  Saturation (Exercise) 95 %   8L   Oxygen  Saturation (Exit) 94 %   6L   Rating of Perceived Exertion (Exercise) 12    Perceived Dyspnea (Exercise) 1    Duration Continue with 30 min of aerobic exercise without signs/symptoms of physical distress.    Intensity THRR unchanged      Progression   Progression Continue to progress workloads to maintain intensity without  signs/symptoms of physical distress.      Resistance Training   Weight blue bands    Reps 10-15    Time 10 Minutes      Interval Training   Interval Training No      Oxygen    Oxygen  Continuous    Liters 6-8      NuStep   Level 4    SPM 121    Minutes 15    METs 2.5      Track   Laps 13    Minutes 15    METs 2.62      Oxygen    Maintain Oxygen  Saturation 88% or higher          Social History   Tobacco Use  Smoking Status Former   Current packs/day: 0.00   Average packs/day: 1 pack/day for 45.0 years (45.0 ttl pk-yrs)   Types: Cigarettes   Start date: 12/27/1963   Quit date: 12/26/2008   Years since quitting: 15.7  Smokeless Tobacco Never    Goals Met:  Independence with exercise equipment Exercise tolerated well No report of concerns or symptoms today Strength training completed today  Goals Unmet:  Not Applicable  Comments: Service time is from 1317 to 1441    Dr. Slater Staff is  Medical Director for Pulmonary Rehab at [facility].

## 2024-09-12 ENCOUNTER — Encounter (HOSPITAL_COMMUNITY)
Admission: RE | Admit: 2024-09-12 | Discharge: 2024-09-12 | Disposition: A | Discharge status: Home / Self Care | Source: Ambulatory Visit | Attending: Internal Medicine

## 2024-09-12 DIAGNOSIS — J9611 Chronic respiratory failure with hypoxia: Secondary | ICD-10-CM

## 2024-09-12 DIAGNOSIS — J849 Interstitial pulmonary disease, unspecified: Secondary | ICD-10-CM | POA: Diagnosis not present

## 2024-09-12 NOTE — Progress Notes (Signed)
 Daily Session Note  Patient Details  Name: Patty [name] MRN: [id number] Date of Birth: [birth year] Referring Provider:   Conrad Ports Pulmonary Rehab Walk Test from 02/15/2021 in [facility] for Heart, Vascular, & Lung Health  Referring Provider Dr. Geronimo    Encounter Date: 09/12/2024  Check In:  Session Check In - 09/12/24 1338       Check-In   Supervising physician immediately available to respond to emergencies [facility] immediately available    Physician(s) Damien Braver, NP    [facility]    Staff Present Johnnie Moats, MS, ACSM-CEP, Exercise Physiologist;Casey Claudene Candia Levin, RN, BSN;Randi Reeve BS, ACSM-CEP, Exercise Physiologist    Virtual Visit No    Medication changes reported     No    Fall or balance concerns reported    No    Tobacco Cessation No Change    Warm-up and Cool-down Performed as group-led instruction    Resistance Training Performed Yes    VAD Patient? No    PAD/SET Patient? No      Pain Assessment   Currently in Pain? No/denies    Multiple Pain Sites No          Capillary Blood Glucose: No results found for this or any previous visit (from the past 24 hours).    Social History   Tobacco Use  Smoking Status Former   Current packs/day: 0.00   Average packs/day: 1 pack/day for 45.0 years (45.0 ttl pk-yrs)   Types: Cigarettes   Start date: 12/27/1963   Quit date: 12/26/2008   Years since quitting: 15.7  Smokeless Tobacco Never    Goals Met:  Independence with exercise equipment Exercise tolerated well No report of concerns or symptoms today Strength training completed today  Goals Unmet:  Not Applicable  Comments: Service time is from 1316 to 1452    Dr. Slater Staff is Medical Director for Pulmonary Rehab at [facility].

## 2024-09-12 NOTE — Progress Notes (Signed)
 Home Exercise Prescription I have reviewed a Home Exercise Prescription with Patty [name]. Pt is currently walking 20 min or more x3 days a week. Encouraged her to continue. She also can exercise at the gym. She likes to exercise. She can increase her time or intensity as able. The patient stated that their goals were to lose weight and travel. We reviewed exercise guidelines, target heart rate during exercise, RPE Scale, weather conditions, endpoints for exercise, warmup and cool down. The patient is encouraged to come to me with any questions. I will continue to follow up with the patient to assist them with progression and safety.  Spent 15 min discussing home exercise plan and goals.  Patty [name], [place], ACSM-CEP 09/12/2024 3:49 PM

## 2024-09-13 ENCOUNTER — Encounter: Payer: Self-pay | Admitting: Family Medicine

## 2024-09-16 ENCOUNTER — Other Ambulatory Visit: Payer: Self-pay

## 2024-09-16 MED ORDER — LUER LOCK SAFETY SYRINGES 25G X 1" 3 ML MISC: 5 refills | Status: AC

## 2024-09-16 MED ORDER — FLUOXETINE HCL 20 MG PO CAPS: 20.0000 mg | ORAL_CAPSULE | Freq: Every day | ORAL | 3 refills | Status: AC

## 2024-09-16 MED ORDER — AMOXICILLIN 500 MG PO CAPS: 2000.0000 mg | ORAL_CAPSULE | Freq: Once | ORAL | 1 refills | Status: DC | PRN

## 2024-09-16 MED ORDER — CYANOCOBALAMIN 1000 MCG/ML IJ SOLN: 1000.0000 ug | INTRAMUSCULAR | 1 refills | Status: AC

## 2024-09-16 NOTE — Telephone Encounter (Signed)
 05/29/2024 LOV  07/06/2024 fill date  90/1 refill

## 2024-09-17 ENCOUNTER — Encounter (HOSPITAL_COMMUNITY)
Admission: RE | Admit: 2024-09-17 | Discharge: 2024-09-17 | Disposition: A | Discharge status: Home / Self Care | Source: Ambulatory Visit | Attending: Internal Medicine | Admitting: Internal Medicine

## 2024-09-17 DIAGNOSIS — J9611 Chronic respiratory failure with hypoxia: Secondary | ICD-10-CM

## 2024-09-17 DIAGNOSIS — J849 Interstitial pulmonary disease, unspecified: Secondary | ICD-10-CM | POA: Diagnosis not present

## 2024-09-17 MED ORDER — CLONAZEPAM 0.5 MG PO TABS: 0.5000 mg | ORAL_TABLET | Freq: Every day | ORAL | 1 refills | Status: AC | PRN

## 2024-09-17 NOTE — Progress Notes (Signed)
 Daily Session Note  Patient Details  Name: Patty [name] MRN: [id number] Date of Birth: [birth year] Referring Provider:   Conrad Ports Pulmonary Rehab Walk Test from 02/15/2021 in [facility] for Heart, Vascular, & Lung Health  Referring Provider Dr. Geronimo    Encounter Date: 09/17/2024  Check In:  Session Check In - 09/17/24 1335       Check-In   Supervising physician immediately available to respond to emergencies [facility] MD immediately available    Physician(s) Damien Braver, NP    [facility]    Staff Present Johnnie Moats, MS, ACSM-CEP, Exercise Physiologist;Shateka Petrea Claudene Candia Levin, RN, BSN;Randi Reeve BS, ACSM-CEP, Exercise Physiologist    Virtual Visit No    Medication changes reported     No    Fall or balance concerns reported    No    Tobacco Cessation No Change    Warm-up and Cool-down Performed as group-led instruction    Resistance Training Performed Yes    VAD Patient? No    PAD/SET Patient? No      Pain Assessment   Currently in Pain? No/denies    Multiple Pain Sites No          Capillary Blood Glucose: No results found for this or any previous visit (from the past 24 hours).    Social History   Tobacco Use  Smoking Status Former   Current packs/day: 0.00   Average packs/day: 1 pack/day for 45.0 years (45.0 ttl pk-yrs)   Types: Cigarettes   Start date: 12/27/1963   Quit date: 12/26/2008   Years since quitting: 15.7  Smokeless Tobacco Never    Goals Met:  Proper associated with RPD/PD & O2 Sat Independence with exercise equipment Exercise tolerated well No report of concerns or symptoms today Strength training completed today  Goals Unmet:  Not Applicable  Comments: Service time is from 1317 to 1445.    Dr. Slater Staff is Medical Director for Pulmonary Rehab at [facility].

## 2024-09-18 ENCOUNTER — Ambulatory Visit: Admitting: Primary Care

## 2024-09-19 ENCOUNTER — Encounter (HOSPITAL_COMMUNITY)
Admission: RE | Admit: 2024-09-19 | Discharge: 2024-09-19 | Disposition: A | Discharge status: Home / Self Care | Source: Ambulatory Visit | Attending: Internal Medicine | Admitting: Internal Medicine

## 2024-09-19 DIAGNOSIS — J849 Interstitial pulmonary disease, unspecified: Secondary | ICD-10-CM | POA: Diagnosis not present

## 2024-09-19 DIAGNOSIS — J9611 Chronic respiratory failure with hypoxia: Secondary | ICD-10-CM

## 2024-09-19 NOTE — Progress Notes (Signed)
 Daily Session Note  Patient Details  Name: Patty [name] MRN: [id number] Date of Birth: [birth year] Referring Provider:   Conrad Ports Pulmonary Rehab Walk Test from 02/15/2021 in [facility] for Heart, Vascular, & Lung Health  Referring Provider Dr. Geronimo    Encounter Date: 09/19/2024  Check In:  Session Check In - 09/19/24 1337       Check-In   Supervising physician immediately available to respond to emergencies [facility] MD immediately available    Physician(s) Lum Louis, NP    [facility]    Staff Present Augustin Sharps, Candia Levin, RN, BSN;Randi Reeve BS, ACSM-CEP, Exercise Physiologist    Virtual Visit No    Medication changes reported     No    Fall or balance concerns reported    No    Tobacco Cessation No Change    Warm-up and Cool-down Performed as group-led instruction    Resistance Training Performed Yes    VAD Patient? No    PAD/SET Patient? No      Pain Assessment   Currently in Pain? No/denies    Multiple Pain Sites No          Capillary Blood Glucose: No results found for this or any previous visit (from the past 24 hours).    Social History   Tobacco Use  Smoking Status Former   Current packs/day: 0.00   Average packs/day: 1 pack/day for 45.0 years (45.0 ttl pk-yrs)   Types: Cigarettes   Start date: 12/27/1963   Quit date: 12/26/2008   Years since quitting: 15.7  Smokeless Tobacco Never    Goals Met:  Independence with exercise equipment Exercise tolerated well No report of concerns or symptoms today Strength training completed today  Goals Unmet:  Not Applicable  Comments: Service time is from 1302 to 1454    Dr. Slater Staff is Medical Director for Pulmonary Rehab at [facility].

## 2024-09-24 ENCOUNTER — Encounter (HOSPITAL_COMMUNITY)
Admission: RE | Admit: 2024-09-24 | Discharge: 2024-09-24 | Disposition: A | Discharge status: Home / Self Care | Source: Ambulatory Visit | Attending: Internal Medicine | Admitting: Internal Medicine

## 2024-09-24 VITALS — Wt 271.6 lb

## 2024-09-24 DIAGNOSIS — J849 Interstitial pulmonary disease, unspecified: Secondary | ICD-10-CM

## 2024-09-24 DIAGNOSIS — J9611 Chronic respiratory failure with hypoxia: Secondary | ICD-10-CM

## 2024-09-24 NOTE — Progress Notes (Signed)
 Daily Session Note  Patient Details  Name: Patty [name] MRN: [id number] Date of Birth: February 27, 1951 Referring Provider:   Conrad Ports Pulmonary Rehab Walk Test from 02/15/2021 in [facility] for Heart, Vascular, & Lung Health  Referring Provider Dr. Geronimo    Encounter Date: 09/24/2024  Check In:  Session Check In - 09/24/24 1410       Check-In   Supervising physician immediately available to respond to emergencies [facility] MD immediately available    Physician(s) Rosaline Skains, NP    [facility]    Staff Present Augustin Sharps, Candia Levin, RN, Maud Moats, MS, ACSM-CEP, Exercise Physiologist    Virtual Visit No    Medication changes reported     No    Fall or balance concerns reported    No    Tobacco Cessation No Change    Warm-up and Cool-down Performed as group-led instruction    Resistance Training Performed Yes    VAD Patient? No    PAD/SET Patient? No      Pain Assessment   Currently in Pain? No/denies    Multiple Pain Sites No          Capillary Blood Glucose: No results found for this or any previous visit (from the past 24 hours).   Exercise Prescription Changes - 09/24/24 1500       Response to Exercise   Blood Pressure (Admit) 138/70    Blood Pressure (Exercise) 156/74    Blood Pressure (Exit) 140/70    Heart Rate (Admit) 93 bpm    Heart Rate (Exercise) 130 bpm    Heart Rate (Exit) 99 bpm    Oxygen  Saturation (Admit) 91 %    Oxygen  Saturation (Exercise) 92 %    Oxygen  Saturation (Exit) 91 %    Rating of Perceived Exertion (Exercise) 13    Perceived Dyspnea (Exercise) 1    Duration Continue with 30 min of aerobic exercise without signs/symptoms of physical distress.    Intensity THRR unchanged      Progression   Progression Continue to progress workloads to maintain intensity without signs/symptoms of physical distress.      Resistance Training   Weight blue bands    Reps 10-15     Time 10 Minutes      Oxygen    Oxygen  Continuous    Liters 6-8      Treadmill   MPH 1.5    Grade 2    Minutes 15    METs 2.4      NuStep   Level 4    Minutes 15    METs 2.6      Oxygen    Maintain Oxygen  Saturation 88% or higher          Social History   Tobacco Use  Smoking Status Former   Current packs/day: 0.00   Average packs/day: 1 pack/day for 45.0 years (45.0 ttl pk-yrs)   Types: Cigarettes   Start date: 12/27/1963   Quit date: 12/26/2008   Years since quitting: 15.7  Smokeless Tobacco Never    Goals Met:  Proper associated with RPD/PD & O2 Sat Independence with exercise equipment Exercise tolerated well No report of concerns or symptoms today Strength training completed today  Goals Unmet:  Not Applicable  Comments: Service time is from 1312 to 1438.    Dr. Slater Staff is Medical Director for Pulmonary Rehab at [facility].

## 2024-09-25 NOTE — Progress Notes (Addendum)
 Pulmonary Individual Treatment Plan  Patient Details  Name: Patty [name] MRN: [id number] Date of Birth: 1951/12/10 Referring Provider:   Conrad Ports Pulmonary Rehab Walk Test from 02/15/2021 in [facility] for Heart, Vascular, & Lung [facility]  Referring Provider Dr. Geronimo    Initial Encounter Date:  Flowsheet Row Pulmonary Rehab Walk Test from 02/15/2021 in [facility] for Heart, Vascular, & Lung Health  Date 02/15/21    Visit Diagnosis: ILD (interstitial lung disease) (HCC)  Chronic respiratory failure with hypoxia (HCC)  Patient's Home Medications on Admission:   Current Outpatient Medications:    albuterol  (VENTOLIN  HFA) 108 (90 Base) MCG/ACT inhaler, INHALE 2 PUFFS INTO THE LUNGS EVERY 6 HOURS AS NEEDED FOR WHEEZING OR SHORTNESS OF BREATH, Disp: 6.7 g, Rfl: 6   amoxicillin  (AMOXIL ) 500 MG capsule, Take 4 capsules (2,000 mg total) by mouth once as needed for up to 1 dose. Before dental procedures, Disp: 20 capsule, Rfl: 1   aspirin  EC 81 MG tablet, Take 81 mg by mouth daily., Disp: , Rfl:    Calcium Carbonate-Vit D-Min (CALCIUM 1200) 1200-1000 MG-UNIT CHEW, Chew 1 tablet by mouth daily., Disp: , Rfl:    CANNABIDIOL PO, Take 1 Dose by mouth 2 (two) times daily as needed (anxiety/sleep.). CBD Oil, Disp: , Rfl:    cetirizine  (ZYRTEC ) 10 MG tablet, Take 10 mg by mouth daily as needed (sinus/allergies.)., Disp: , Rfl:    cetirizine -pseudoephedrine (ZYRTEC -D) 5-120 MG tablet, Take 1 tablet by mouth daily as needed (sinus headaches.)., Disp: , Rfl:    clonazePAM  (KLONOPIN ) 0.5 MG tablet, Take 1 tablet (0.5 mg total) by mouth daily as needed for anxiety., Disp: 90 tablet, Rfl: 1   cyanocobalamin  (VITAMIN B12) 1000 MCG/ML injection, Inject 1 mL (1,000 mcg total) into the muscle every 30 (thirty) days., Disp: 10 mL, Rfl: 1   dorzolamide (TRUSOPT) 2 % ophthalmic solution, Place 1 drop into the right eye 2 (two) times daily., Disp: , Rfl:     DULoxetine  (CYMBALTA ) 20 MG capsule, Take 1 capsule (20 mg total) by mouth daily. (Patient not taking: Reported on 07/10/2024), Disp: 30 capsule, Rfl: 5   FLUoxetine  (PROZAC ) 20 MG capsule, Take 1 capsule (20 mg total) by mouth daily., Disp: 90 capsule, Rfl: 3   fluticasone (FLONASE) 50 MCG/ACT nasal spray, Place 1-2 sprays into both nostrils daily as needed for allergies or rhinitis., Disp: , Rfl:    furosemide  (LASIX ) 20 MG tablet, Take 1 tablet (20 mg total) by mouth as needed for fluid., Disp: 30 tablet, Rfl: 11   gabapentin  (NEURONTIN ) 100 MG capsule, Take 3 capsules (300 mg total) by mouth at bedtime., Disp: 90 capsule, Rfl: 11   Glycerin-Polysorbate 80 (REFRESH DRY EYE THERAPY OP), Place 1 drop into both eyes daily. (Patient not taking: Reported on 07/10/2024), Disp: , Rfl:    hydrocortisone  (PROCTOZONE -HC) 2.5 % rectal cream, Place 1 application rectally 2 (two) times daily as needed (Crohn's flare)., Disp: 28 g, Rfl: 2   ibuprofen (ADVIL) 200 MG tablet, Take 400-800 mg by mouth every 8 (eight) hours as needed (pain.)., Disp: , Rfl:    mesalamine (PENTASA) 250 MG CR capsule, Take 250-500 mg by mouth 4 (four) times daily as needed (crohn's disease flare ups)., Disp: , Rfl:    Multiple Vitamins-Minerals (ICAPS AREDS 2 PO), Take 1 tablet by mouth daily., Disp: , Rfl:    potassium chloride  (MICRO-K ) 10 MEQ CR capsule, Take 1 capsule (10 mEq total) by mouth daily  as needed (when taking furosemide )., Disp: 30 capsule, Rfl: 3   prednisoLONE acetate (PRED FORTE) 1 % ophthalmic suspension, Place 1 drop into both eyes 2 (two) times daily as needed (Uveitis)., Disp: , Rfl:    Probiotic Product (PROBIOTIC PO), Take 1 capsule by mouth 2 (two) times a week. As needed, Disp: , Rfl:    SYRINGE-NEEDLE, DISP, 3 ML (LUER LOCK SAFETY SYRINGES) 25G X 1 3 ML MISC, USE AS DIRECTED, Disp: 100 each, Rfl: 5   traMADol  (ULTRAM ) 50 MG tablet, Take 1 tablet (50 mg total) by mouth every 6 (six) hours as needed., Disp: 20  tablet, Rfl: 0   Treprostinil  (TYVASO  REFILL) 0.6 MG/ML SOLN, Inhale 54 mcg into the lungs in the morning, at noon, in the evening, and at bedtime. (Patient not taking: Reported on 07/10/2024), Disp: 87 mL, Rfl: 11   triamcinolone  cream (KENALOG ) 0.1 %, Apply 1 Application topically 2 (two) times daily. (Patient not taking: Reported on 07/10/2024), Disp: 30 g, Rfl: 0  Past Medical History: Past Medical History:  Diagnosis Date   Abnormal nuclear stress test    a. 07/2017: cath with no CAD   Anxiety    Aortic atherosclerosis    Arthritis    Chronic diastolic CHF (congestive heart failure) (HCC)    Coronary artery calcification seen on CT scan    Crohn disease (HCC)    DVT (deep venous thrombosis) (HCC)    Dyspnea    Grade I diastolic dysfunction    Hyperlipidemia    ILD (interstitial lung disease) (HCC)    OSA on CPAP    CPAP    AND O2   Pernicious anemia    Polycythemia vera (HCC)    Pulmonary embolism (HCC) 2010   s/p hip surgery , reports this was never truly confirmed    Pulmonary hypertension (HCC)    Sleep disorder 01/10/2020   Uses klonopin  for sleep since 2014; sleep walking. And anxiety    Tobacco Use: Social History   Tobacco Use  Smoking Status Former   Current packs/day: 0.00   Average packs/day: 1 pack/day for 45.0 years (45.0 ttl pk-yrs)   Types: Cigarettes   Start date: 12/27/1963   Quit date: 12/26/2008   Years since quitting: 15.7  Smokeless Tobacco Never    Labs: Review Flowsheet  More data exists      Latest Ref Rng & Units 01/31/2022 11/25/2022 11/14/2023 12/04/2023 03/04/2024  Labs for ITP Cardiac and Pulmonary Rehab  Cholestrol 0 - 200 mg/dL - 809  - 796  -  LDL (calc) 0 - 99 mg/dL - 882  - 868  -  HDL-C >39.00 mg/dL - 44.29  - 43.69  -  Trlycerides 0.0 - 149.0 mg/dL - 14.9  - 21.9  -  Hemoglobin A1c 4.0 - 5.6 % 6.2  6.8  6.8  - 6.1     Capillary Blood Glucose: Lab Results  Component Value Date   GLUCAP 123 (H) 01/28/2021     Pulmonary  Assessment Scores:  Pulmonary Assessment Scores     Row Name 07/10/24 1356 09/12/24 1539       ADL UCSD   ADL Phase Entry Exit    SOB Score total 38 34      CAT Score   CAT Score 14 13      mMRC Score   mMRC Score 4 --      UCSD: Self-administered rating of dyspnea associated with activities of daily living (ADLs) 6-point scale (0 = not  at all to 5 = maximal or unable to do because of breathlessness)  Scoring Scores range from 0 to 120.  Minimally important difference is 5 units  CAT: CAT can identify the health impairment of COPD patients and is better correlated with disease progression.  CAT has a scoring range of zero to 40. The CAT score is classified into four groups of low (less than 10), medium (10 - 20), high (21-30) and very high (31-40) based on the impact level of disease on health status. A CAT score over 10 suggests significant symptoms.  A worsening CAT score could be explained by an exacerbation, poor medication adherence, poor inhaler technique, or progression of COPD or comorbid conditions.  CAT MCID is 2 points  mMRC: mMRC (Modified Medical Research Council) Dyspnea Scale is used to assess the degree of baseline functional disability in patients of respiratory disease due to dyspnea. No minimal important difference is established. A decrease in score of 1 point or greater is considered a positive change.   Pulmonary Function Assessment:  Pulmonary Function Assessment - 07/10/24 1345       Breath   Bilateral Breath Sounds Decreased    Shortness of Breath Yes;Fear of Shortness of Breath;Limiting activity          Exercise Target Goals: Exercise Program Goal: Individual exercise prescription set using results from initial 6 min walk test and THRR while considering  patient's activity barriers and safety.   Exercise Prescription Goal: Initial exercise prescription builds to 30-45 minutes a day of aerobic activity, 2-3 days per week.  Home exercise  guidelines will be given to patient during program as part of exercise prescription that the participant will acknowledge.  Activity Barriers & Risk Stratification:  Activity Barriers & Cardiac Risk Stratification - 07/10/24 1343       Activity Barriers & Cardiac Risk Stratification   Activity Barriers Deconditioning;Muscular Weakness;Shortness of Breath;Other (comment)    Comments leg and foot pain          6 Minute Walk:  6 Minute Walk     Row Name 07/10/24 1503         6 Minute Walk   Phase Initial     Distance 530 feet     Walk Time 6 minutes     # of Rest Breaks 3  1:34-2:30, 3:20-4:29, 5:46-6     MPH 1     METS 1.18     RPE 17     Perceived Dyspnea  4     VO2 Peak 4.13     Symptoms Yes (comment)     Comments lightheadedness     Resting HR 87 bpm     Resting BP 138/80     Resting Oxygen  Saturation  95 %  2L     Exercise Oxygen  Saturation  during 6 min walk 84 %  3L, ultimately increasing to 8L     Max Ex. HR 137 bpm     Max Ex. BP 218/80     2 Minute Post BP 182/80       Interval HR   1 Minute HR 124     2 Minute HR 111     3 Minute HR 120     4 Minute HR 121     5 Minute HR 126     6 Minute HR 122     2 Minute Post HR 95     Interval Heart Rate? Yes       Interval  Oxygen    Interval Oxygen ? Yes     Baseline Oxygen  Saturation % 95 %  2L     1 Minute Oxygen  Saturation % 88 %  titrated to 3L     1 Minute Liters of Oxygen  3 L     2 Minute Oxygen  Saturation % 91 %  1:34 dropped to 84%, O2 up to 4L     2 Minute Liters of Oxygen  4 L     3 Minute Oxygen  Saturation % 91 %     3 Minute Liters of Oxygen  4 L     4 Minute Oxygen  Saturation % 93 %  3:20 O2 dropped to 88%, O2 titrated to 6L     4 Minute Liters of Oxygen  6 L     5 Minute Oxygen  Saturation % 91 %     5 Minute Liters of Oxygen  6 L     6 Minute Oxygen  Saturation % 88 %  O2 titrated to 8L     6 Minute Liters of Oxygen  8 L     2 Minute Post Oxygen  Saturation % 98 %     2 Minute Post Liters of Oxygen   6 L        Oxygen  Initial Assessment:  Oxygen  Initial Assessment - 07/10/24 1343       Home Oxygen    Home Oxygen  Device Home Concentrator;E-Tanks    Sleep Oxygen  Prescription Continuous    Liters per minute 2    Home Exercise Oxygen  Prescription Continuous    Liters per minute 5   pulsed   Home Resting Oxygen  Prescription Continuous    Liters per minute 3    Compliance with Home Oxygen  Use Yes      Initial 6 min Walk   Oxygen  Used Continuous    Liters per minute 8      Program Oxygen  Prescription   Program Oxygen  Prescription Continuous    Liters per minute 8      Intervention   Short Term Goals To learn and exhibit compliance with exercise, home and travel O2 prescription;To learn and understand importance of maintaining oxygen  saturations>88%;To learn and demonstrate proper pursed lip breathing techniques or other breathing techniques. ;To learn and understand importance of monitoring SPO2 with pulse oximeter and demonstrate accurate use of the pulse oximeter.;To learn and demonstrate proper use of respiratory medications    Long  Term Goals Exhibits compliance with exercise, home  and travel O2 prescription;Maintenance of O2 saturations>88%;Compliance with respiratory medication;Demonstrates proper use of MDI's;Exhibits proper breathing techniques, such as pursed lip breathing or other method taught during program session;Verbalizes importance of monitoring SPO2 with pulse oximeter and return demonstration          Oxygen  Re-Evaluation:  Oxygen  Re-Evaluation     Row Name 07/25/24 0840 08/27/24 0819 09/17/24 1545         Program Oxygen  Prescription   Program Oxygen  Prescription Continuous Continuous Continuous     Liters per minute 8 8 8      Comments using 6-8L with exercise using 6-8L with exercise using 6-8L with exercise       Home Oxygen    Home Oxygen  Device Home Concentrator;E-Tanks Home Concentrator;E-Tanks Home Concentrator;E-Tanks     Sleep Oxygen   Prescription Continuous Continuous Continuous     Liters per minute 2 2 2      Home Exercise Oxygen  Prescription Continuous Continuous Continuous     Liters per minute 5  pulsed 5  pulsed 5  pulsed     Home Resting Oxygen   Prescription Continuous Continuous Continuous     Liters per minute 3 3 3      Compliance with Home Oxygen  Use Yes Yes Yes       Goals/Expected Outcomes   Short Term Goals To learn and exhibit compliance with exercise, home and travel O2 prescription;To learn and understand importance of maintaining oxygen  saturations>88%;To learn and demonstrate proper pursed lip breathing techniques or other breathing techniques. ;To learn and understand importance of monitoring SPO2 with pulse oximeter and demonstrate accurate use of the pulse oximeter.;To learn and demonstrate proper use of respiratory medications To learn and exhibit compliance with exercise, home and travel O2 prescription;To learn and understand importance of maintaining oxygen  saturations>88%;To learn and demonstrate proper pursed lip breathing techniques or other breathing techniques. ;To learn and understand importance of monitoring SPO2 with pulse oximeter and demonstrate accurate use of the pulse oximeter.;To learn and demonstrate proper use of respiratory medications To learn and exhibit compliance with exercise, home and travel O2 prescription;To learn and understand importance of maintaining oxygen  saturations>88%;To learn and demonstrate proper pursed lip breathing techniques or other breathing techniques. ;To learn and understand importance of monitoring SPO2 with pulse oximeter and demonstrate accurate use of the pulse oximeter.;To learn and demonstrate proper use of respiratory medications     Long  Term Goals Exhibits compliance with exercise, home  and travel O2 prescription;Maintenance of O2 saturations>88%;Compliance with respiratory medication;Demonstrates proper use of MDI's;Exhibits proper breathing techniques,  such as pursed lip breathing or other method taught during program session;Verbalizes importance of monitoring SPO2 with pulse oximeter and return demonstration Exhibits compliance with exercise, home  and travel O2 prescription;Maintenance of O2 saturations>88%;Compliance with respiratory medication;Demonstrates proper use of MDI's;Exhibits proper breathing techniques, such as pursed lip breathing or other method taught during program session;Verbalizes importance of monitoring SPO2 with pulse oximeter and return demonstration Exhibits compliance with exercise, home  and travel O2 prescription;Maintenance of O2 saturations>88%;Compliance with respiratory medication;Demonstrates proper use of MDI's;Exhibits proper breathing techniques, such as pursed lip breathing or other method taught during program session;Verbalizes importance of monitoring SPO2 with pulse oximeter and return demonstration     Comments 6-8L 6-8L 6-8L     Goals/Expected Outcomes Compliance and understanding of oxygen  saturation monitoring and breathing techniques to decrease shortness of breath. Compliance and understanding of oxygen  saturation monitoring and breathing techniques to decrease shortness of breath. Compliance and understanding of oxygen  saturation monitoring and breathing techniques to decrease shortness of breath.        Oxygen  Discharge (Final Oxygen  Re-Evaluation):  Oxygen  Re-Evaluation - 09/17/24 1545       Program Oxygen  Prescription   Program Oxygen  Prescription Continuous    Liters per minute 8    Comments using 6-8L with exercise      Home Oxygen    Home Oxygen  Device Home Concentrator;E-Tanks    Sleep Oxygen  Prescription Continuous    Liters per minute 2    Home Exercise Oxygen  Prescription Continuous    Liters per minute 5   pulsed   Home Resting Oxygen  Prescription Continuous    Liters per minute 3    Compliance with Home Oxygen  Use Yes      Goals/Expected Outcomes   Short Term Goals To learn and  exhibit compliance with exercise, home and travel O2 prescription;To learn and understand importance of maintaining oxygen  saturations>88%;To learn and demonstrate proper pursed lip breathing techniques or other breathing techniques. ;To learn and understand importance of monitoring SPO2 with pulse oximeter and demonstrate accurate use of the pulse oximeter.;To  learn and demonstrate proper use of respiratory medications    Long  Term Goals Exhibits compliance with exercise, home  and travel O2 prescription;Maintenance of O2 saturations>88%;Compliance with respiratory medication;Demonstrates proper use of MDI's;Exhibits proper breathing techniques, such as pursed lip breathing or other method taught during program session;Verbalizes importance of monitoring SPO2 with pulse oximeter and return demonstration    Comments 6-8L    Goals/Expected Outcomes Compliance and understanding of oxygen  saturation monitoring and breathing techniques to decrease shortness of breath.          Initial Exercise Prescription:   Perform Capillary Blood Glucose checks as needed.  Exercise Prescription Changes:   Exercise Prescription Changes     Row Name 07/16/24 1500 07/30/24 1500 08/13/24 1500 08/27/24 1500 09/10/24 1500     Response to Exercise   Blood Pressure (Admit) 128/72 120/76 122/72 142/70 134/80   Blood Pressure (Exercise) 186/90 184/80 184/88 160/82 172/66   Blood Pressure (Exit) 128/62 124/72 144/82 128/66 122/70   Heart Rate (Admit) 104 bpm 89 bpm 83 bpm 90 bpm 95 bpm   Heart Rate (Exercise) 151 bpm 122 bpm 130 bpm 126 bpm 129 bpm   Heart Rate (Exit) 114 bpm 98 bpm 93 bpm 102 bpm 1.74 bpm   Oxygen  Saturation (Admit) 96 %  8L 97 %  6L cont 97 %  6L 96 % 97 %  6L   Oxygen  Saturation (Exercise) 88 %  8L 93 %  8L cont 91 %  8L 96 % 95 %  8L   Oxygen  Saturation (Exit) 93 %  RA 94 %  6L pulsed O2 97 %  8L 93 % 94 %  6L   Rating of Perceived Exertion (Exercise) 14 13 13 11 12    Perceived Dyspnea  (Exercise) 3 1 3 1 1    Duration Continue with 30 min of aerobic exercise without signs/symptoms of physical distress. Continue with 30 min of aerobic exercise without signs/symptoms of physical distress. Continue with 30 min of aerobic exercise without signs/symptoms of physical distress. Continue with 30 min of aerobic exercise without signs/symptoms of physical distress. Continue with 30 min of aerobic exercise without signs/symptoms of physical distress.   Intensity Other (comment)  will contact MD for an increase in THR THRR New  THR increased to 160 THRR unchanged THRR unchanged THRR unchanged     Progression   Progression Continue to progress workloads to maintain intensity without signs/symptoms of physical distress. Continue to progress workloads to maintain intensity without signs/symptoms of physical distress. Continue to progress workloads to maintain intensity without signs/symptoms of physical distress. Continue to progress workloads to maintain intensity without signs/symptoms of physical distress. Continue to progress workloads to maintain intensity without signs/symptoms of physical distress.     Resistance Training   Weight red bands red bands red bands blue bands blue bands   Reps 10-15 10-15 10-15 10-15 10-15   Time 10 Minutes 10 Minutes 10 Minutes 10 Minutes 10 Minutes     Interval Training   Interval Training -- -- No No No     Oxygen    Oxygen  Continuous Continuous Continuous Continuous Continuous   Liters 8-10 8 6-8 6-8 6-8     NuStep   Level 1 3 3 3 4    SPM 103 104 139 168 121   Minutes 15 15 15 15 15    METs 2.1 2 2.5 2.4 2.5     Track   Laps 5 8 14   [id number] track 15 13   Minutes 15  15 15 15 15    METs 1.62 2 2.46 2.87 2.62     Oxygen    Maintain Oxygen  Saturation 88% or higher -- 88% or higher  >90% 88% or higher 88% or higher    Row Name 09/12/24 1500 09/24/24 1500           Response to Exercise   Blood Pressure (Admit) -- 138/70      Blood Pressure  (Exercise) -- 156/74      Blood Pressure (Exit) -- 140/70      Heart Rate (Admit) -- 93 bpm      Heart Rate (Exercise) -- 130 bpm      Heart Rate (Exit) -- 99 bpm      Oxygen  Saturation (Admit) -- 91 %      Oxygen  Saturation (Exercise) -- 92 %      Oxygen  Saturation (Exit) -- 91 %      Rating of Perceived Exertion (Exercise) -- 13      Perceived Dyspnea (Exercise) -- 1      Duration -- Continue with 30 min of aerobic exercise without signs/symptoms of physical distress.      Intensity -- THRR unchanged        Progression   Progression -- Continue to progress workloads to maintain intensity without signs/symptoms of physical distress.        Resistance Training   Weight -- blue bands      Reps -- 10-15      Time -- 10 Minutes        Oxygen    Oxygen  -- Continuous      Liters -- 6-8        Treadmill   MPH -- 1.5      Grade -- 2      Minutes -- 15      METs -- 2.4        NuStep   Level -- 4      Minutes -- 15      METs -- 2.6        Home Exercise Plan   Plans to continue exercise at Home (comment) --      Frequency Add 1 additional day to program exercise sessions. --      Initial Home Exercises Provided 09/12/24 --        Oxygen    Maintain Oxygen  Saturation -- 88% or higher         Exercise Comments:   Exercise Comments     Row Name 09/12/24 1547           Exercise Comments Discussed with home exercise plan. Pt is currently walking 20 min or more x3 days a week. Encouraged her to continue. She also can exercise at the gym. She likes to exercise. She can increase her time or intensity as able.          Exercise Goals and Review:   Exercise Goals     Row Name 07/10/24 1343             Exercise Goals   Increase Physical Activity Yes       Intervention Provide advice, education, support and counseling about physical activity/exercise needs.;Develop an individualized exercise prescription for aerobic and resistive training based on initial evaluation  findings, risk stratification, comorbidities and participant's personal goals.       Expected Outcomes Short Term: Attend rehab on a regular basis to increase amount of physical activity.;Long Term: Exercising regularly at least 3-5 days a week.;Long Term:  Add in home exercise to make exercise part of routine and to increase amount of physical activity.       Increase Strength and Stamina Yes       Intervention Provide advice, education, support and counseling about physical activity/exercise needs.;Develop an individualized exercise prescription for aerobic and resistive training based on initial evaluation findings, risk stratification, comorbidities and participant's personal goals.       Expected Outcomes Short Term: Increase workloads from initial exercise prescription for resistance, speed, and METs.;Short Term: Perform resistance training exercises routinely during rehab and add in resistance training at home;Long Term: Improve cardiorespiratory fitness, muscular endurance and strength as measured by increased METs and functional capacity ( )       Able to understand and use rate of perceived exertion (RPE) scale Yes       Intervention Provide education and explanation on how to use RPE scale       Expected Outcomes Long Term:  Able to use RPE to guide intensity level when exercising independently;Short Term: Able to use RPE daily in rehab to express subjective intensity level       Able to understand and use Dyspnea scale Yes       Intervention Provide education and explanation on how to use Dyspnea scale       Expected Outcomes Short Term: Able to use Dyspnea scale daily in rehab to express subjective sense of shortness of breath during exertion;Long Term: Able to use Dyspnea scale to guide intensity level when exercising independently       Knowledge and understanding of Target Heart Rate Range (THRR) Yes       Intervention Provide education and explanation of THRR including how the numbers  were predicted and where they are located for reference       Expected Outcomes Short Term: Able to state/look up THRR;Short Term: Able to use daily as guideline for intensity in rehab;Long Term: Able to use THRR to govern intensity when exercising independently       Understanding of Exercise Prescription Yes       Intervention Provide education, explanation, and written materials on patient's individual exercise prescription       Expected Outcomes Short Term: Able to explain program exercise prescription;Long Term: Able to explain home exercise prescription to exercise independently          Exercise Goals Re-Evaluation :  Exercise Goals Re-Evaluation     Row Name 07/25/24 0836 08/27/24 0817 09/17/24 1543         Exercise Goal Re-Evaluation   Exercise Goals Review Increase Physical Activity;Able to understand and use Dyspnea scale;Understanding of Exercise Prescription;Increase Strength and Stamina;Knowledge and understanding of Target Heart Rate Range (THRR);Able to understand and use rate of perceived exertion (RPE) scale Increase Physical Activity;Able to understand and use Dyspnea scale;Understanding of Exercise Prescription;Increase Strength and Stamina;Knowledge and understanding of Target Heart Rate Range (THRR);Able to understand and use rate of perceived exertion (RPE) scale Increase Physical Activity;Able to understand and use Dyspnea scale;Understanding of Exercise Prescription;Increase Strength and Stamina;Knowledge and understanding of Target Heart Rate Range (THRR);Able to understand and use rate of perceived exertion (RPE) scale     Comments Pt has completed 3 exercise sessions with good attendance. She is exercising on the recumbent stepper for 15 min, level 1, METs 1.9-2.1. Will increase to level 2 this next session. She then is walking the track with the gocart for 15 min, METs 2.5. She performs warm up and cool down standing, squats supported.  Using red bands. Will progress as  tolerated. Pt has completed 12 exercise sessions with good attendance, very motivated. She is exercising on the recumbent stepper for 15 min, level 3, METs 2.6.  She then is walking the track with the gocart for 15 min, METs 2.62. She performs warm up and cool down standing, squats supported. Using blue bands, 5.8 lbs. Will progress as tolerated. Pt has completed 19 exercise sessions with good attendance, very motivated. She is exercising on the recumbent stepper for 15 min, level 4, METs 2.5.  She then is walking, moved to the treadmill today. She walked 15 min, 1.7 mph, 2% incline, METs 2.2. She was significantly exerted on the treadmill but wants to keep trying to increase her ability to walk hills at home and have enough oxygen .  She performs warm up and cool down standing, squats supported. Using blue bands, 5.8 lbs. Will progress as tolerated.     Expected Outcomes Through exercise at rehab and at home, patient will increase physical capacity and ADL's will be easier to perform. The patient will also feel comfortable establishing a home exercise program. Through exercise at rehab and at home, patient will increase physical capacity and ADL's will be easier to perform. The patient will also feel comfortable establishing a home exercise program. Through exercise at rehab and at home, patient will increase physical capacity and ADL's will be easier to perform. The patient will also feel comfortable establishing a home exercise program.        Discharge Exercise Prescription (Final Exercise Prescription Changes):  Exercise Prescription Changes - 09/24/24 1500       Response to Exercise   Blood Pressure (Admit) 138/70    Blood Pressure (Exercise) 156/74    Blood Pressure (Exit) 140/70    Heart Rate (Admit) 93 bpm    Heart Rate (Exercise) 130 bpm    Heart Rate (Exit) 99 bpm    Oxygen  Saturation (Admit) 91 %    Oxygen  Saturation (Exercise) 92 %    Oxygen  Saturation (Exit) 91 %    Rating of  Perceived Exertion (Exercise) 13    Perceived Dyspnea (Exercise) 1    Duration Continue with 30 min of aerobic exercise without signs/symptoms of physical distress.    Intensity THRR unchanged      Progression   Progression Continue to progress workloads to maintain intensity without signs/symptoms of physical distress.      Resistance Training   Weight blue bands    Reps 10-15    Time 10 Minutes      Oxygen    Oxygen  Continuous    Liters 6-8      Treadmill   MPH 1.5    Grade 2    Minutes 15    METs 2.4      NuStep   Level 4    Minutes 15    METs 2.6      Oxygen    Maintain Oxygen  Saturation 88% or higher          Nutrition:  Target Goals: Understanding of nutrition guidelines, daily intake of sodium 1500mg , cholesterol 200mg , calories 30% from fat and 7% or less from saturated fats, daily to have 5 or more servings of fruits and vegetables.  Biometrics:  Pre Biometrics - 07/10/24 1326       Pre Biometrics   Grip Strength 28 kg   old dynomometer          Nutrition Therapy Plan and Nutrition Goals:  Nutrition Therapy & Goals -  08/20/24 1414       Nutrition Therapy   Diet Heart Healthy Diet      Personal Nutrition Goals   Nutrition Goal Patient to improve diet quality by using the plate method as a guide for meal planning to include lean protein/plant protein, fruits, vegetables, whole grains, nonfat dairy as part of a well-balanced diet.    Comments Goals in progress. Patient has medical history of pulmonary HTN, OSA, hyperlipidemia, Dm2, chrohn's disease, pernicious anemia, morbid obesity. A1c is well controlled in a prediabetic range. LDL is not at goal; zetia was previously recommended as statin was declined but not currently prescribed. She reports following a high protein diet. She has maintained her weight since starting with our program. Patient will benefit from participation in intensive cardiac rehab for nutrition education, exercise, and lifestyle  modification.      Intervention Plan   Intervention Nutrition handout(s) given to patient.;Prescribe, educate and counsel regarding individualized specific dietary modifications aiming towards targeted core components such as weight, hypertension, lipid management, diabetes, heart failure and other comorbidities.    Expected Outcomes Short Term Goal: Understand basic principles of dietary content, such as calories, fat, sodium, cholesterol and nutrients.;Long Term Goal: Adherence to prescribed nutrition plan.          Nutrition Assessments:  Nutrition Assessments - 09/12/24 1534       Rate Your Plate Scores   Post Score 73         MEDIFICTS Score Key: >=70 Need to make dietary changes  40-70 Heart Healthy Diet <= 40 Therapeutic Level Cholesterol Diet  Flowsheet Row PULMONARY REHAB OTHER RESPIRATORY from 09/12/2024 in [facility] for Heart, Vascular, & Lung Health  Picture Your Plate Total Score on Discharge 73   Picture Your Plate Scores: <59 Unhealthy dietary pattern with much room for improvement. 41-50 Dietary pattern unlikely to meet recommendations for good health and room for improvement. 51-60 More healthful dietary pattern, with some room for improvement.  >60 Healthy dietary pattern, although there may be some specific behaviors that could be improved.    Nutrition Goals Re-Evaluation:  Nutrition Goals Re-Evaluation     Row Name 07/17/24 1626 08/20/24 1414           Goals   Current Weight 271 lb 13.2 oz (123.3 kg) 274 lb 11.1 oz (124.6 kg)      Comment A1c 6.1, cholesterol 203, LDL 131 A1c 6.1, cholesterol 203, LDL 131      Expected Outcome Patient has medical history of pulmonary HTN, OSA, hyperlipidemia, Dm2, chrohn's disease, pernicious anemia, morbid obesity. A1c is well controlled in a prediabetic range. LDL is not at goal; zetia was previously recommended as statin was declined but not currently prescribed. Patient will benefit from  participation in intensive cardiac rehab for nutrition education, exercise, and lifestyle modification. Goals in progress. Patient has medical history of pulmonary HTN, OSA, hyperlipidemia, Dm2, chrohn's disease, pernicious anemia, morbid obesity. A1c is well controlled in a prediabetic range. LDL is not at goal; zetia was previously recommended as statin was declined but not currently prescribed. She reports following a high protein diet. She has maintained her weight since starting with our program. Patient will benefit from participation in intensive cardiac rehab for nutrition education, exercise, and lifestyle modification.         Nutrition Goals Discharge (Final Nutrition Goals Re-Evaluation):  Nutrition Goals Re-Evaluation - 08/20/24 1414       Goals   Current Weight 274 lb 11.1  oz (124.6 kg)    Comment A1c 6.1, cholesterol 203, LDL 131    Expected Outcome Goals in progress. Patient has medical history of pulmonary HTN, OSA, hyperlipidemia, Dm2, chrohn's disease, pernicious anemia, morbid obesity. A1c is well controlled in a prediabetic range. LDL is not at goal; zetia was previously recommended as statin was declined but not currently prescribed. She reports following a high protein diet. She has maintained her weight since starting with our program. Patient will benefit from participation in intensive cardiac rehab for nutrition education, exercise, and lifestyle modification.          Psychosocial: Target Goals: Acknowledge presence or absence of significant depression and/or stress, maximize coping skills, provide positive support system. Participant is able to verbalize types and ability to use techniques and skills needed for reducing stress and depression.  Initial Review & Psychosocial Screening:  Initial Psych Review & Screening - 07/10/24 1345       Initial Review   Current issues with Current Anxiety/Panic;History of Depression;Current Psychotropic Meds      Family  Dynamics   Good Support System? Yes      Barriers   Psychosocial barriers to participate in program There are no identifiable barriers or psychosocial needs.      Screening Interventions   Interventions Provide feedback about the scores to participant    Expected Outcomes Short Term goal: Utilizing psychosocial counselor, staff and physician to assist with identification of specific Stressors or current issues interfering with healing process. Setting desired goal for each stressor or current issue identified.;Long Term Goal: Stressors or current issues are controlled or eliminated.;Short Term goal: Identification and review with participant of any Quality of Life or Depression concerns found by scoring the questionnaire.;Long Term goal: The participant improves quality of Life and PHQ9 Scores as seen by post scores and/or verbalization of changes          Quality of Life Scores:  Scores of 19 and below usually indicate a poorer quality of life in these areas.  A difference of  2-3 points is a clinically meaningful difference.  A difference of 2-3 points in the total score of the Quality of Life Index has been associated with significant improvement in overall quality of life, self-image, physical symptoms, and general health in studies assessing change in quality of life.  PHQ-9: Review Flowsheet  More data exists      09/12/2024 07/10/2024 05/29/2024 03/04/2024 12/04/2023  Depression screen PHQ 2/9  Decreased Interest 0 0 0 0 0  Down, Depressed, Hopeless 0 0 0 0 0  PHQ - 2 Score 0 0 0 0 0  Altered sleeping 1 1 - - -  Tired, decreased energy 1 1 - - -  Change in appetite 0 0 - - -  Feeling bad or failure about yourself  0 0 - - -  Trouble concentrating 2 1 - - -  Moving slowly or fidgety/restless 0 0 - - -  Suicidal thoughts 0 0 - - -  PHQ-9 Score 4 3 - - -  Difficult doing work/chores Somewhat difficult Not difficult at all - - -   Interpretation of Total Score  Total Score  Depression Severity:  1-4 = Minimal depression, 5-9 = Mild depression, 10-14 = Moderate depression, 15-19 = Moderately severe depression, 20-27 = Severe depression   Psychosocial Evaluation and Intervention:  Psychosocial Evaluation - 07/10/24 1346       Psychosocial Evaluation & Interventions   Interventions Encouraged to exercise with the program  and follow exercise prescription    Comments Marcey denies any psy/soc barriers or conerns at this time. She feels like her mental health is stable and is compliant with taking her psychotropic meds    Expected Outcomes For Tammara to participate in PR without any psy/soc barriers or concerns    Continue Psychosocial Services  No Follow up required          Psychosocial Re-Evaluation:  Psychosocial Re-Evaluation     Row Name 07/26/24 1455 08/23/24 1127 09/16/24 0900         Psychosocial Re-Evaluation   Current issues with Current Anxiety/Panic;History of Depression;Current Psychotropic Meds Current Anxiety/Panic;History of Depression;Current Psychotropic Meds Current Anxiety/Panic;History of Depression;Current Psychotropic Meds     Comments Monthly psychosocial re-evaluation as follows: Nil denies any psy/soc barriers or concerns at this time. She feels like her mental health is stable and is compliant with taking her psychotropic meds Monthly psychosocial re-evaluation as follows: Demika continues to deny any psy/soc barriers or concerns at this time. She feels like her mental health is stable and is compliant with taking her psychotropic meds. She declines any resources, referrals or needs currently. Monthly psychosocial re-evaluation as follows: Aliah continues to deny any psy/soc barriers or concerns at this time. She feels like her mental health is stable and is compliant with taking her psychotropic meds. She declines any resources, referrals or needs currently.     Expected Outcomes For Rosalena to participate in PR without any psy/soc  barriers or concerns For Nyliah to participate in PR without any psy/soc barriers or concerns For Aliz to participate in PR without any psy/soc barriers or concerns     Interventions Stress management education;Relaxation education Encouraged to attend Pulmonary Rehabilitation for the exercise Encouraged to attend Pulmonary Rehabilitation for the exercise     Continue Psychosocial Services  No Follow up required No Follow up required No Follow up required        Psychosocial Discharge (Final Psychosocial Re-Evaluation):  Psychosocial Re-Evaluation - 09/16/24 0900       Psychosocial Re-Evaluation   Current issues with Current Anxiety/Panic;History of Depression;Current Psychotropic Meds    Comments Monthly psychosocial re-evaluation as follows: Lee-Ann continues to deny any psy/soc barriers or concerns at this time. She feels like her mental health is stable and is compliant with taking her psychotropic meds. She declines any resources, referrals or needs currently.    Expected Outcomes For Kathlee to participate in PR without any psy/soc barriers or concerns    Interventions Encouraged to attend Pulmonary Rehabilitation for the exercise    Continue Psychosocial Services  No Follow up required          Education: Education Goals: Education classes will be provided on a weekly basis, covering required topics. Participant will state understanding/return demonstration of topics presented.  Learning Barriers/Preferences:  Learning Barriers/Preferences - 07/10/24 1348       Learning Barriers/Preferences   Learning Barriers None    Learning Preferences None          Education Topics: Know Your Numbers Group instruction that is supported by a PowerPoint presentation. Instructor discusses importance of knowing and understanding resting, exercise, and post-exercise oxygen  saturation, heart rate, and blood pressure. Oxygen  saturation, heart rate, blood pressure, rating of perceived exertion,  and dyspnea are reviewed along with a normal range for these values.  Flowsheet Row PULMONARY REHAB OTHER RESPIRATORY from 09/12/2024 in [facility] for Heart, Vascular, & Lung Health  Date 09/12/24  Educator EP  Instruction Review Code 1- Verbalizes Understanding    Exercise for the Pulmonary Patient Group instruction that is supported by a PowerPoint presentation. Instructor discusses benefits of exercise, core components of exercise, frequency, duration, and intensity of an exercise routine, importance of utilizing pulse oximetry during exercise, safety while exercising, and options of places to exercise outside of rehab.  Flowsheet Row PULMONARY REHAB OTHER RESPIRATORY from 09/05/2024 in [facility] for Heart, Vascular, & Lung Health  Date 09/05/24  Educator EP  Instruction Review Code 1- Verbalizes Understanding    MET Level  Group instruction provided by PowerPoint, verbal discussion, and written material to support subject matter. Instructor reviews what METs are and how to increase METs.  Flowsheet Row PULMONARY REHAB OTHER RESPIRATORY from 08/08/2024 in [facility] for Heart, Vascular, & Lung Health  Date 08/08/24  Educator EP  Instruction Review Code 1- Verbalizes Understanding    Pulmonary Medications Verbally interactive group education provided by instructor with focus on inhaled medications and proper administration. Flowsheet Row PULMONARY REHAB OTHER RESPIRATORY from 08/29/2024 in [facility] for Heart, Vascular, & Lung Health  Date 08/29/24  Educator RT  Instruction Review Code 1- Verbalizes Understanding    Anatomy and Physiology of the Respiratory System Group instruction provided by PowerPoint, verbal discussion, and written material to support subject matter. Instructor reviews respiratory cycle and anatomical components of the respiratory system and their functions.  Instructor also reviews differences in obstructive and restrictive respiratory diseases with examples of each.  Flowsheet Row PULMONARY REHAB OTHER RESPIRATORY from 08/22/2024 in [facility] for Heart, Vascular, & Lung Health  Date 08/22/24  Educator RT  Instruction Review Code 1- Verbalizes Understanding    Oxygen  Safety Group instruction provided by PowerPoint, verbal discussion, and written material to support subject matter. There is an overview of "What is Oxygen " and "Why do we need it".  Instructor also reviews how to create a safe environment for oxygen  use, the importance of using oxygen  as prescribed, and the risks of noncompliance. There is a brief discussion on traveling with oxygen  and resources the patient may utilize. Flowsheet Row PULMONARY REHAB OTHER RESPIRATORY from 09/19/2024 in [facility] for Heart, Vascular, & Lung Health  Date 09/19/24  Educator RN  Instruction Review Code 1- Verbalizes Understanding    Oxygen  Use Group instruction provided by PowerPoint, verbal discussion, and written material to discuss how supplemental oxygen  is prescribed and different types of oxygen  supply systems. Resources for more information are provided.    Breathing Techniques Group instruction that is supported by demonstration and informational handouts. Instructor discusses the benefits of pursed lip and diaphragmatic breathing and detailed demonstration on how to perform both.     Risk Factor Reduction Group instruction that is supported by a PowerPoint presentation. Instructor discusses the definition of a risk factor, different risk factors for pulmonary disease, and how the heart and lungs work together.   Pulmonary Diseases Group instruction provided by PowerPoint, verbal discussion, and written material to support subject matter. Instructor gives an overview of the different type of pulmonary diseases. There is also a discussion on  risk factors and symptoms as well as ways to manage the diseases. Flowsheet Row PULMONARY REHAB OTHER RESPIRATORY from 08/15/2024 in [facility] for Heart, Vascular, & Lung Health  Date 08/15/24  Educator RT  Instruction Review Code 1- Verbalizes Understanding    Stress and Energy Conservation Group instruction provided  by PowerPoint, verbal discussion, and written material to support subject matter. Instructor gives an overview of stress and the impact it can have on the body. Instructor also reviews ways to reduce stress. There is also a discussion on energy conservation and ways to conserve energy throughout the day. Flowsheet Row PULMONARY REHAB OTHER RESPIRATORY from 07/18/2024 in [facility] for Heart, Vascular, & Lung Health  Date 07/18/24  Educator RN  Instruction Review Code 1- Verbalizes Understanding    Warning Signs and Symptoms Group instruction provided by PowerPoint, verbal discussion, and written material to support subject matter. Instructor reviews warning signs and symptoms of stroke, heart attack, cold and flu. Instructor also reviews ways to prevent the spread of infection. Flowsheet Row PULMONARY REHAB OTHER RESPIRATORY from 07/25/2024 in [facility] for Heart, Vascular, & Lung Health  Date 07/25/24  Educator RN  Instruction Review Code 1- Verbalizes Understanding    Other Education Group or individual verbal, written, or video instructions that support the educational goals of the pulmonary rehab program. Flowsheet Row PULMONARY REHAB OTHER RESPIRATORY from 04/15/2021 in [facility] for Heart, Vascular, & Lung Health  Date 03/25/21  [Beat a Sedentary Lifestyle]  Educator handout     Knowledge Questionnaire Score:  Knowledge Questionnaire Score - 09/12/24 1538       Knowledge Questionnaire Score   Post Score 18/18          Core Components/Risk Factors/Patient  Goals at Admission:  Personal Goals and Risk Factors at Admission - 07/10/24 1349       Core Components/Risk Factors/Patient Goals on Admission    Weight Management Yes;Obesity;Weight Loss    Intervention Weight Management: Develop a combined nutrition and exercise program designed to reach desired caloric intake, while maintaining appropriate intake of nutrient and fiber, sodium and fats, and appropriate energy expenditure required for the weight goal.;Weight Management: Provide education and appropriate resources to help participant work on and attain dietary goals.;Weight Management/Obesity: Establish reasonable short term and long term weight goals.;Obesity: Provide education and appropriate resources to help participant work on and attain dietary goals.    Admit Weight 274 lb 11.1 oz (124.6 kg)    Expected Outcomes Short Term: Continue to assess and modify interventions until short term weight is achieved;Long Term: Adherence to nutrition and physical activity/exercise program aimed toward attainment of established weight goal;Weight Maintenance: Understanding of the daily nutrition guidelines, which includes 25-35% calories from fat, 7% or less cal from saturated fats, less than 200mg  cholesterol, less than 1.5gm of sodium, & 5 or more servings of fruits and vegetables daily;Weight Loss: Understanding of general recommendations for a balanced deficit meal plan, which promotes 1-2 lb weight loss per week and includes a negative energy balance of [phone] kcal/d;Understanding recommendations for meals to include 15-35% energy as protein, 25-35% energy from fat, 35-60% energy from carbohydrates, less than 200mg  of dietary cholesterol, 20-35 gm of total fiber daily;Understanding of distribution of calorie intake throughout the day with the consumption of 4-5 meals/snacks    Improve shortness of breath with ADL's Yes    Intervention Provide education, individualized exercise plan and daily activity  instruction to help decrease symptoms of SOB with activities of daily living.    Expected Outcomes Short Term: Improve cardiorespiratory fitness to achieve a reduction of symptoms when performing ADLs;Long Term: Be able to perform more ADLs without symptoms or delay the onset of symptoms          Core Components/Risk  Factors/Patient Goals Review:   Goals and Risk Factor Review     Row Name 07/26/24 1457 08/23/24 1128 09/16/24 0901         Core Components/Risk Factors/Patient Goals Review   Personal Goals Review Weight Management/Obesity;Improve shortness of breath with ADL's;Develop more efficient breathing techniques such as purse lipped breathing and diaphragmatic breathing and practicing self-pacing with activity. Weight Management/Obesity;Improve shortness of breath with ADL's Weight Management/Obesity;Improve shortness of breath with ADL's     Review Monthly review of patient's Core Components/Risk Factors/Patient Goals are as follows: Goal in progress for improving her shortness of breath with ADLs. Emali needed to increase to 8L with exertion. She has been exercising on the NuStep and walking the track. She has completed 4 sessions and is making great progress. She is motivated to exercise to achieve her goals. Goal met on developing more efficient breathing techniques such as purse lipped breathing and diaphragmatic breathing; and practicing self-pacing with activity. Calisa can initiate pursed lip breathing and is practicing diaphragmatic breathing before bed. She can self-pace when needed and allows herself to take breaks while walking or exercising. This is her 3rd time being in the program. Goal progressing on weight loss. Dalexa has been working with our dietician on ways to incorporate the plate method and choosing a wide variety of foods. She is working on decreasing her caloric intake as well as eating out less. She is down ~3.3#. We will continue to monitor Aryam's progress throughout  the program. Monthly review of patient's Core Components/Risk Factors/Patient Goals are as follows: Goal in progress for improving her shortness of breath with ADLs. Areana has maintained this month on 8L of oxygen  with exertion. She has been exercising on the Nu[employer] and walking the track. She is motivated to exercise to achieve her goals. Goal progressing on weight loss. Lucely has been working with our dietician on ways to incorporate the plate method and choosing a wide variety of foods. She is working on decreasing her caloric intake as well as eating out less. This month she has maintained her weight. We will continue to monitor Samiyah's progress throughout the program. Monthly review of patient's Core Components/Risk Factors/Patient Goals are as follows: Goal in progress for improving her shortness of breath with ADLs. Tasheka has maintained this month on 8L of oxygen  with exertion. She has been exercising on the NuStep and walking the track. She is motivated to exercise to achieve her goals. Goal progressing on weight loss. Hanadi has been working with our dietician on ways to incorporate the plate method and choosing a wide variety of foods. She is working on decreasing her caloric intake as well as eating out less. This month she is down ~2#. We will continue to monitor Caedence's progress throughout the program.     Expected Outcomes Pt will show progress toward meeting expected goals and outcomes. Pt will show progress toward meeting expected goals and outcomes. Pt will show progress toward meeting expected goals and outcomes.        Core Components/Risk Factors/Patient Goals at Discharge (Final Review):   Goals and Risk Factor Review - 09/16/24 0901       Core Components/Risk Factors/Patient Goals Review   Personal Goals Review Weight Management/Obesity;Improve shortness of breath with ADL's    Review Monthly review of patient's Core Components/Risk Factors/Patient Goals are as follows: Goal in  progress for improving her shortness of breath with ADLs. Moriah has maintained this month on 8L of oxygen  with exertion. She has been  exercising on the NuStep and walking the track. She is motivated to exercise to achieve her goals. Goal progressing on weight loss. Donte has been working with our dietician on ways to incorporate the plate method and choosing a wide variety of foods. She is working on decreasing her caloric intake as well as eating out less. This month she is down ~2#. We will continue to monitor Sieanna's progress throughout the program.    Expected Outcomes Pt will show progress toward meeting expected goals and outcomes.          ITP Comments:   Comments: Pt is making expected progress toward Pulmonary Rehab goals after completing 21 session(s). Recommend continued exercise, life style modification, education, and utilization of breathing techniques to increase stamina and strength, while also decreasing shortness of breath with exertion.  Dr. Slater Staff is Medical Director for Pulmonary Rehab at [facility].

## 2024-09-26 ENCOUNTER — Encounter (HOSPITAL_COMMUNITY)
Admission: RE | Admit: 2024-09-26 | Discharge: 2024-09-26 | Disposition: A | Discharge status: Home / Self Care | Source: Ambulatory Visit | Attending: Internal Medicine | Admitting: Internal Medicine

## 2024-09-26 DIAGNOSIS — J9611 Chronic respiratory failure with hypoxia: Secondary | ICD-10-CM | POA: Insufficient documentation

## 2024-09-26 DIAGNOSIS — J849 Interstitial pulmonary disease, unspecified: Secondary | ICD-10-CM | POA: Insufficient documentation

## 2024-09-26 NOTE — Progress Notes (Signed)
 Daily Session Note  Patient Details  Name: Patty [name] MRN: [id number] Date of Birth: 12-Dec-1951 Referring Provider:   Conrad Ports Pulmonary Rehab Walk Test from 02/15/2021 in [facility]  Referring Provider Dr. Geronimo    Encounter Date: 09/26/2024  Check In:  Session Check In - 09/26/24 1325       Check-In   Supervising physician immediately available to respond to emergencies [facility] MD immediately available    Physician(s) Jackee Wyn Raddle, NP    Location [facility]    Staff Present Augustin Sharps, Candia Levin, RN, Maud Moats, MS, ACSM-CEP, Exercise Physiologist    Virtual Visit No    Medication changes reported     No    Fall or balance concerns reported    No    Tobacco Cessation No Change    Warm-up and Cool-down Performed as group-led instruction    Resistance Training Performed Yes    VAD Patient? No    PAD/SET Patient? No      Pain Assessment   Currently in Pain? No/denies    Multiple Pain Sites No          Capillary Blood Glucose: No results found for this or any previous visit (from the past 24 hours).    Social History   Tobacco Use  Smoking Status Former   Current packs/day: 0.00   Average packs/day: 1 pack/day for 45.0 years (45.0 ttl pk-yrs)   Types: Cigarettes   Start date: 12/27/1963   Quit date: 12/26/2008   Years since quitting: 15.7  Smokeless Tobacco Never    Goals Met:  Independence with exercise equipment Exercise tolerated well No report of concerns or symptoms today Strength training completed today  Goals Unmet:  Not Applicable  Comments: Service time is from 1309 to 1449    Dr. Slater Staff is Medical Director for Pulmonary Rehab at [facility].

## 2024-10-01 ENCOUNTER — Encounter (HOSPITAL_COMMUNITY)
Admission: RE | Admit: 2024-10-01 | Discharge: 2024-10-01 | Disposition: A | Source: Ambulatory Visit | Attending: Internal Medicine

## 2024-10-01 DIAGNOSIS — J849 Interstitial pulmonary disease, unspecified: Secondary | ICD-10-CM | POA: Diagnosis not present

## 2024-10-01 DIAGNOSIS — J9611 Chronic respiratory failure with hypoxia: Secondary | ICD-10-CM

## 2024-10-01 NOTE — Progress Notes (Signed)
 Daily Session Note  Patient Details  Name: Patty [name] MRN: [id number] Date of Birth: [birth year] Referring Provider:   Conrad Ports Pulmonary Rehab Walk Test from 02/15/2021 in [facility]  Referring Provider Dr. Geronimo    Encounter Date: 10/01/2024  Check In:  Session Check In - 10/01/24 1414       Check-In   Supervising physician immediately available to respond to emergencies [facility] MD immediately available    Physician(s) Barnie Press, NP    [facility]    Staff Present Augustin Sharps, Candia Levin, RN, Maud Moats, MS, ACSM-CEP, Exercise Physiologist    Virtual Visit No    Medication changes reported     No    Fall or balance concerns reported    No    Tobacco Cessation No Change    Warm-up and Cool-down Performed as group-led instruction    Resistance Training Performed Yes    VAD Patient? No    PAD/SET Patient? No      Pain Assessment   Currently in Pain? No/denies    Multiple Pain Sites No          Capillary Blood Glucose: No results found for this or any previous visit (from the past 24 hours).    Social History   Tobacco Use  Smoking Status Former   Current packs/day: 0.00   Average packs/day: 1 pack/day for 45.0 years (45.0 ttl pk-yrs)   Types: Cigarettes   Start date: 12/27/1963   Quit date: 12/26/2008   Years since quitting: 15.7  Smokeless Tobacco Never    Goals Met:  Proper associated with RPD/PD & O2 Sat Independence with exercise equipment Exercise tolerated well No report of concerns or symptoms today Strength training completed today  Goals Unmet:  Not Applicable  Comments: Service time is from 1319 to 1433.    Dr. Slater Staff is Medical Director for Pulmonary Rehab at [facility].

## 2024-10-02 ENCOUNTER — Other Ambulatory Visit: Payer: Self-pay | Admitting: Cardiovascular Disease

## 2024-10-02 ENCOUNTER — Telehealth (HOSPITAL_COMMUNITY): Payer: Self-pay

## 2024-10-02 NOTE — Telephone Encounter (Signed)
 LVM for pt asking her to come in early for Pulm Rehab class to do her .

## 2024-10-03 ENCOUNTER — Encounter (HOSPITAL_COMMUNITY)
Admission: RE | Admit: 2024-10-03 | Discharge: 2024-10-03 | Disposition: A | Source: Ambulatory Visit | Attending: Internal Medicine

## 2024-10-03 DIAGNOSIS — J9611 Chronic respiratory failure with hypoxia: Secondary | ICD-10-CM

## 2024-10-03 DIAGNOSIS — J849 Interstitial pulmonary disease, unspecified: Secondary | ICD-10-CM | POA: Diagnosis not present

## 2024-10-03 NOTE — Progress Notes (Signed)
 Daily Session Note  Patient Details  Name: Patty [name] MRN: [id number] Date of Birth: [birth year] Referring Provider:   Conrad Ports Pulmonary Rehab Walk Test from 02/15/2021 in [facility]  Referring Provider Dr. Geronimo    Encounter Date: 10/03/2024  Check In:  Session Check In - 10/03/24 1331       Check-In   Supervising physician immediately available to respond to emergencies [facility] MD immediately available    Physician(s) Josefa Beauvais, NP    [facility]    Staff Present Augustin Sharps, Candia Levin, RN, Maud Moats, MS, ACSM-CEP, Exercise Physiologist;Randi Midge BS, ACSM-CEP, Exercise Physiologist    Virtual Visit No    Medication changes reported     No    Fall or balance concerns reported    No    Tobacco Cessation No Change    Warm-up and Cool-down Performed as group-led instruction    Resistance Training Performed Yes    VAD Patient? No    PAD/SET Patient? No      Pain Assessment   Currently in Pain? No/denies    Multiple Pain Sites No          Capillary Blood Glucose: No results found for this or any previous visit (from the past 24 hours).    Social History   Tobacco Use  Smoking Status Former   Current packs/day: 0.00   Average packs/day: 1 pack/day for 45.0 years (45.0 ttl pk-yrs)   Types: Cigarettes   Start date: 12/27/1963   Quit date: 12/26/2008   Years since quitting: 15.7  Smokeless Tobacco Never    Goals Met:  Proper associated with RPD/PD & O2 Sat Independence with exercise equipment Exercise tolerated well No report of concerns or symptoms today Strength training completed today  Goals Unmet:  Not Applicable  Comments: Service time is from 1248 to 1451.    Dr. Slater Staff is Medical Director for Pulmonary Rehab at [facility].

## 2024-10-04 NOTE — Progress Notes (Signed)
 Discharge Progress Report  Patient Details  Name: Patty [name] MRN: [id number] Date of Birth: 1951/01/02 Referring Provider:   [facility] Walk Test from 02/15/2021 in [facility]  Referring Provider Dr. Geronimo     Number of Visits: 24  Reason for Discharge:  Patient has met program and personal goals.  Smoking History:  Social History   Tobacco Use  Smoking Status Former   Current packs/day: 0.00   Average packs/day: 1 pack/day for 45.0 years (45.0 ttl pk-yrs)   Types: Cigarettes   Start date: 12/27/1963   Quit date: 12/26/2008   Years since quitting: 15.7  Smokeless Tobacco Never    Diagnosis:  ILD (interstitial lung disease) (HCC)  Chronic respiratory failure with hypoxia (HCC)  ADL [facility]:  Pulmonary Assessment Scores     Row Name 07/10/24 1356 09/12/24 1539 10/03/24 1537     ADL [facility]   ADL Phase Entry Exit Exit   SOB Score total 38 34 --     CAT Score   CAT Score 14 13 --     mMRC Score   mMRC Score 4 -- 4      Initial Exercise Prescription:   Discharge Exercise Prescription (Final Exercise Prescription Changes):  Exercise Prescription Changes - 09/24/24 1500       Response to Exercise   Blood Pressure (Admit) 138/70    Blood Pressure (Exercise) 156/74    Blood Pressure (Exit) 140/70    Heart Rate (Admit) 93 bpm    Heart Rate (Exercise) 130 bpm    Heart Rate (Exit) 99 bpm    Oxygen  Saturation (Admit) 91 %    Oxygen  Saturation (Exercise) 92 %    Oxygen  Saturation (Exit) 91 %    Rating of Perceived Exertion (Exercise) 13    Perceived Dyspnea (Exercise) 1    Duration Continue with 30 min of aerobic exercise without signs/symptoms of physical distress.    Intensity THRR unchanged      Progression   Progression Continue to progress workloads to maintain intensity without signs/symptoms of physical distress.      Resistance Training   Weight blue bands    Reps  10-15    Time 10 Minutes      Oxygen    Oxygen  Continuous    Liters 6-8      Treadmill   MPH 1.5    Grade 2    Minutes 15    METs 2.4      NuStep   Level 4    Minutes 15    METs 2.6      Oxygen    Maintain Oxygen  Saturation 88% or higher          Functional Capacity:  6 Minute Walk     Row Name 07/10/24 1503 10/03/24 1530       6 Minute Walk   Phase Initial Discharge    Distance 530 feet 1140 feet    Distance % Change -- 115 %    Distance Feet Change -- 610 ft    Walk Time 6 minutes 6 minutes    # of Rest Breaks 3  1:34-2:30, 3:20-4:29, 5:46-6 0    MPH 1 2.16    METS 1.18 2.29    RPE 17 11    Perceived Dyspnea  4 2    VO2 Peak 4.13 8.01    Symptoms Yes (comment) No    Comments lightheadedness used Gocart    Resting  HR 87 bpm 94 bpm    Resting BP 138/80 130/70    Resting Oxygen  Saturation  95 %  2L 97 %    Exercise Oxygen  Saturation  during 6 min walk 84 %  3L, ultimately increasing to 8L 88 %    Max Ex. HR 137 bpm 145 bpm    Max Ex. BP 218/80 174/64    2 Minute Post BP 182/80 156/76      Interval HR   1 Minute HR 124 110    2 Minute HR 111 130    3 Minute HR 120 135    4 Minute HR 121 137    5 Minute HR 126 140    6 Minute HR 122 145    2 Minute Post HR 95 105    Interval Heart Rate? Yes --      Interval Oxygen    Interval Oxygen ? Yes --    Baseline Oxygen  Saturation % 95 %  2L 97 %    1 Minute Oxygen  Saturation % 88 %  titrated to 3L 95 %    1 Minute Liters of Oxygen  3 L 8 L    2 Minute Oxygen  Saturation % 91 %  1:34 dropped to 84%, O2 up to 4L 93 %    2 Minute Liters of Oxygen  4 L 8 L    3 Minute Oxygen  Saturation % 91 % 92 %    3 Minute Liters of Oxygen  4 L 8 L    4 Minute Oxygen  Saturation % 93 %  3:20 O2 dropped to 88%, O2 titrated to 6L 92 %    4 Minute Liters of Oxygen  6 L 8 L    5 Minute Oxygen  Saturation % 91 % 90 %    5 Minute Liters of Oxygen  6 L 8 L    6 Minute Oxygen  Saturation % 88 %  O2 titrated to 8L 88 %    6 Minute Liters of  Oxygen  8 L 8 L    2 Minute Post Oxygen  Saturation % 98 % 97 %    2 Minute Post Liters of Oxygen  6 L 8 L       Psychological, QOL, Others - Outcomes: PHQ 2/9:    09/12/2024    3:37 PM 07/10/2024    1:50 PM 05/29/2024    3:00 PM 03/04/2024    2:07 PM 12/04/2023    1:14 PM  Depression screen PHQ 2/9  Decreased Interest 0 0 0 0 0  Down, Depressed, Hopeless 0 0 0 0 0  PHQ - 2 Score 0 0 0 0 0  Altered sleeping 1 1     Tired, decreased energy 1 1     Change in appetite 0 0     Feeling bad or failure about yourself  0 0     Trouble concentrating 2 1     Moving slowly or fidgety/restless 0 0     Suicidal thoughts 0 0     PHQ-9 Score 4 3     Difficult doing work/chores Somewhat difficult Not difficult at all       Quality of Life:   Personal Goals: Goals established at orientation with interventions provided to work toward goal.  Personal Goals and Risk Factors at Admission - 07/10/24 1349       Core Components/Risk Factors/Patient Goals on Admission    Weight Management Yes;Obesity;Weight Loss    Intervention Weight Management: Develop a combined nutrition and exercise program designed to reach  desired caloric intake, while maintaining appropriate intake of nutrient and fiber, sodium and fats, and appropriate energy expenditure required for the weight goal.;Weight Management: Provide education and appropriate resources to help participant work on and attain dietary goals.;Weight Management/Obesity: Establish reasonable short term and long term weight goals.;Obesity: Provide education and appropriate resources to help participant work on and attain dietary goals.    Admit Weight 274 lb 11.1 oz (124.6 kg)    Expected Outcomes Short Term: Continue to assess and modify interventions until short term weight is achieved;Long Term: Adherence to nutrition and physical activity/exercise program aimed toward attainment of established weight goal;Weight Maintenance: Understanding of the daily  nutrition guidelines, which includes 25-35% calories from fat, 7% or less cal from saturated fats, less than 200mg  cholesterol, less than 1.5gm of sodium, & 5 or more servings of fruits and vegetables daily;Weight Loss: Understanding of general recommendations for a balanced deficit meal plan, which promotes 1-2 lb weight loss per week and includes a negative energy balance of [phone] kcal/d;Understanding recommendations for meals to include 15-35% energy as protein, 25-35% energy from fat, 35-60% energy from carbohydrates, less than 200mg  of dietary cholesterol, 20-35 gm of total fiber daily;Understanding of distribution of calorie intake throughout the day with the consumption of 4-5 meals/snacks    Improve shortness of breath with ADL's Yes    Intervention Provide education, individualized exercise plan and daily activity instruction to help decrease symptoms of SOB with activities of daily living.    Expected Outcomes Short Term: Improve cardiorespiratory fitness to achieve a reduction of symptoms when performing ADLs;Long Term: Be able to perform more ADLs without symptoms or delay the onset of symptoms           Personal Goals Discharge:  Goals and Risk Factor Review     Row Name 07/26/24 1457 08/23/24 1128 09/16/24 0901 10/04/24 1132       Core Components/Risk Factors/Patient Goals Review   Personal Goals Review Weight Management/Obesity;Improve shortness of breath with ADL's;Develop more efficient breathing techniques such as purse lipped breathing and diaphragmatic breathing and practicing self-pacing with activity. Weight Management/Obesity;Improve shortness of breath with ADL's Weight Management/Obesity;Improve shortness of breath with ADL's Weight Management/Obesity;Improve shortness of breath with ADL's    Review Monthly review of patient's Core Components/Risk Factors/Patient Goals are as follows: Goal in progress for improving her shortness of breath with ADLs. Patty [name] needed to  increase to 8L with exertion. She has been exercising on the NuStep and walking the track. She has completed 4 sessions and is making great progress. She is motivated to exercise to achieve her goals. Goal met on developing more efficient breathing techniques such as purse lipped breathing and diaphragmatic breathing; and practicing self-pacing with activity. Patty [name] can initiate pursed lip breathing and is practicing diaphragmatic breathing before bed. She can self-pace when needed and allows herself to take breaks while walking or exercising. This is her 3rd time being in the program. Goal progressing on weight loss. Anaih has been working with our dietician on ways to incorporate the plate method and choosing a wide variety of foods. She is working on decreasing her caloric intake as well as eating out less. She is down ~3.3#. We will continue to monitor Asa's progress throughout the program. Monthly review of patient's Core Components/Risk Factors/Patient Goals are as follows: Goal in progress for improving her shortness of breath with ADLs. Patty [name] has maintained this month on 8L of oxygen  with exertion. She has been exercising on the NuStep and walking the  track. She is motivated to exercise to achieve her goals. Goal progressing on weight loss. Patty [name] has been working with our dietician on ways to incorporate the plate method and choosing a wide variety of foods. She is working on decreasing her caloric intake as well as eating out less. This month she has maintained her weight. We will continue to monitor Patty [name]'s progress throughout the program. Monthly review of patient's Core Components/Risk Factors/Patient Goals are as follows: Goal in progress for improving her shortness of breath with ADLs. Patty [name] has maintained this month on 8L of oxygen  with exertion. She has been exercising on the NuStep and walking the track. She is motivated to exercise to achieve her goals. Goal progressing on weight loss. Patty [name] has  been working with our dietician on ways to incorporate the plate method and choosing a wide variety of foods. She is working on decreasing her caloric intake as well as eating out less. This month she is down ~2#. We will continue to monitor Patty [name]'s progress throughout the program. Patty [name] graduated from the [facility] program on 10/03/24. She did not meet her goal for weight loss. She did meet her goal for improving shortness of breath with ADL's. Initial SOB score 38/ post 34. Initial CAT score 14/ post 13. Tere did great during the program. We wish her the best.    Expected Outcomes Pt will show progress toward meeting expected goals and outcomes. Pt will show progress toward meeting expected goals and outcomes. Pt will show progress toward meeting expected goals and outcomes. To continue to exercise and modify her nutrition and lifestyle post graduation       Exercise Goals and Review:  Exercise Goals     Row Name 07/10/24 1343             Exercise Goals   Increase Physical Activity Yes       Intervention Provide advice, education, support and counseling about physical activity/exercise needs.;Develop an individualized exercise prescription for aerobic and resistive training based on initial evaluation findings, risk stratification, comorbidities and participant's personal goals.       Expected Outcomes Short Term: Attend rehab on a regular basis to increase amount of physical activity.;Long Term: Exercising regularly at least 3-5 days a week.;Long Term: Add in home exercise to make exercise part of routine and to increase amount of physical activity.       Increase Strength and Stamina Yes       Intervention Provide advice, education, support and counseling about physical activity/exercise needs.;Develop an individualized exercise prescription for aerobic and resistive training based on initial evaluation findings, risk stratification, comorbidities and participant's personal goals.       Expected  Outcomes Short Term: Increase workloads from initial exercise prescription for resistance, speed, and METs.;Short Term: Perform resistance training exercises routinely during rehab and add in resistance training at home;Long Term: Improve cardiorespiratory fitness, muscular endurance and strength as measured by increased METs and functional capacity ( )       Able to understand and use rate of perceived exertion (RPE) scale Yes       Intervention Provide education and explanation on how to use RPE scale       Expected Outcomes Long Term:  Able to use RPE to guide intensity level when exercising independently;Short Term: Able to use RPE daily in rehab to express subjective intensity level       Able to understand and use Dyspnea scale Yes       Intervention Provide education  and explanation on how to use Dyspnea scale       Expected Outcomes Short Term: Able to use Dyspnea scale daily in rehab to express subjective sense of shortness of breath during exertion;Long Term: Able to use Dyspnea scale to guide intensity level when exercising independently       Knowledge and understanding of Target Heart Rate Range (THRR) Yes       Intervention Provide education and explanation of THRR including how the numbers were predicted and where they are located for reference       Expected Outcomes Short Term: Able to state/look up THRR;Short Term: Able to use daily as guideline for intensity in rehab;Long Term: Able to use THRR to govern intensity when exercising independently       Understanding of Exercise Prescription Yes       Intervention Provide education, explanation, and written materials on patient's individual exercise prescription       Expected Outcomes Short Term: Able to explain program exercise prescription;Long Term: Able to explain home exercise prescription to exercise independently          Exercise Goals Re-Evaluation:  Exercise Goals Re-Evaluation     Row Name 07/25/24 0836 08/27/24 0817  09/17/24 1543 10/03/24 1534       Exercise Goal Re-Evaluation   Exercise Goals Review Increase Physical Activity;Able to understand and use Dyspnea scale;Understanding of Exercise Prescription;Increase Strength and Stamina;Knowledge and understanding of Target Heart Rate Range (THRR);Able to understand and use rate of perceived exertion (RPE) scale Increase Physical Activity;Able to understand and use Dyspnea scale;Understanding of Exercise Prescription;Increase Strength and Stamina;Knowledge and understanding of Target Heart Rate Range (THRR);Able to understand and use rate of perceived exertion (RPE) scale Increase Physical Activity;Able to understand and use Dyspnea scale;Understanding of Exercise Prescription;Increase Strength and Stamina;Knowledge and understanding of Target Heart Rate Range (THRR);Able to understand and use rate of perceived exertion (RPE) scale Increase Physical Activity;Able to understand and use Dyspnea scale;Understanding of Exercise Prescription;Increase Strength and Stamina;Knowledge and understanding of Target Heart Rate Range (THRR);Able to understand and use rate of perceived exertion (RPE) scale    Comments Pt has completed 3 exercise sessions with good attendance. She is exercising on the recumbent stepper for 15 min, level 1, METs 1.9-2.1. Will increase to level 2 this next session. She then is walking the track with the gocart for 15 min, METs 2.5. She performs warm up and cool down standing, squats supported. Using red bands. Will progress as tolerated. Pt has completed 12 exercise sessions with good attendance, very motivated. She is exercising on the recumbent stepper for 15 min, level 3, METs 2.6.  She then is walking the track with the gocart for 15 min, METs 2.62. She performs warm up and cool down standing, squats supported. Using blue bands, 5.8 lbs. Will progress as tolerated. Pt has completed 19 exercise sessions with good attendance, very motivated. She is  exercising on the recumbent stepper for 15 min, level 4, METs 2.5.  She then is walking, moved to the treadmill today. She walked 15 min, 1.7 mph, 2% incline, METs 2.2. She was significantly exerted on the treadmill but wants to keep trying to increase her ability to walk hills at home and have enough oxygen .  She performs warm up and cool down standing, squats supported. Using blue bands, 5.8 lbs. Will progress as tolerated. Pt completed 24 exercise sessions with good attendance, very motivated. Her peak METs were 2.5 on the recumbent stepper and 2.3 on her walk  test. She increased her 6 min walk test by 115%, 610 ft. Her grip strength increased from 28 to 31.  Her CAT and SOB scores remained the same. She plans to walk in her neighborhood for exercise.    Expected Outcomes Through exercise at rehab and at home, patient will increase physical capacity and ADL's will be easier to perform. The patient will also feel comfortable establishing a home exercise program. Through exercise at rehab and at home, patient will increase physical capacity and ADL's will be easier to perform. The patient will also feel comfortable establishing a home exercise program. Through exercise at rehab and at home, patient will increase physical capacity and ADL's will be easier to perform. The patient will also feel comfortable establishing a home exercise program. Through exercise at rehab and at home, patient will increase physical capacity and ADL's will be easier to perform. The patient will also feel comfortable establishing a home exercise program.       Nutrition & Weight - Outcomes:  Pre Biometrics - 07/10/24 1326       Pre Biometrics   Grip Strength 28 kg   old dynomometer          Nutrition:  Nutrition Therapy & Goals - 08/20/24 1414       Nutrition Therapy   Diet Heart Healthy Diet      Personal Nutrition Goals   Nutrition Goal Patient to improve diet quality by using the plate method as a guide for  meal planning to include lean protein/plant protein, fruits, vegetables, whole grains, nonfat dairy as part of a well-balanced diet.    Comments Goals in progress. Patient has medical history of pulmonary HTN, OSA, hyperlipidemia, Dm2, chrohn's disease, pernicious anemia, morbid obesity. A1c is well controlled in a prediabetic range. LDL is not at goal; zetia was previously recommended as statin was declined but not currently prescribed. She reports following a high protein diet. She has maintained her weight since starting with our program. Patient will benefit from participation in intensive cardiac rehab for nutrition education, exercise, and lifestyle modification.      Intervention Plan   Intervention Nutrition handout(s) given to patient.;Prescribe, educate and counsel regarding individualized specific dietary modifications aiming towards targeted core components such as weight, hypertension, lipid management, diabetes, heart failure and other comorbidities.    Expected Outcomes Short Term Goal: Understand basic principles of dietary content, such as calories, fat, sodium, cholesterol and nutrients.;Long Term Goal: Adherence to prescribed nutrition plan.          Nutrition Discharge:  Nutrition Assessments - 09/12/24 1534       Rate Your Plate Scores   Post Score 73          Education Questionnaire Score:  Knowledge Questionnaire Score - 09/12/24 1538       Knowledge Questionnaire Score   Post Score 18/18          Goals reviewed with patient; copy given to patient.

## 2024-10-07 ENCOUNTER — Encounter: Payer: Self-pay | Admitting: Family Medicine

## 2024-10-07 ENCOUNTER — Ambulatory Visit: Admitting: Family Medicine

## 2024-10-07 VITALS — BP 130/70 | HR 82 | Temp 97.7°F | Ht 64.0 in | Wt 269.6 lb

## 2024-10-07 DIAGNOSIS — J439 Emphysema, unspecified: Secondary | ICD-10-CM

## 2024-10-07 DIAGNOSIS — E782 Mixed hyperlipidemia: Secondary | ICD-10-CM | POA: Diagnosis not present

## 2024-10-07 DIAGNOSIS — J849 Interstitial pulmonary disease, unspecified: Secondary | ICD-10-CM | POA: Diagnosis not present

## 2024-10-07 DIAGNOSIS — H353221 Exudative age-related macular degeneration, left eye, with active choroidal neovascularization: Secondary | ICD-10-CM

## 2024-10-07 DIAGNOSIS — E1165 Type 2 diabetes mellitus with hyperglycemia: Secondary | ICD-10-CM | POA: Diagnosis not present

## 2024-10-07 DIAGNOSIS — K50919 Crohn's disease, unspecified, with unspecified complications: Secondary | ICD-10-CM | POA: Diagnosis not present

## 2024-10-07 DIAGNOSIS — D510 Vitamin B12 deficiency anemia due to intrinsic factor deficiency: Secondary | ICD-10-CM

## 2024-10-07 DIAGNOSIS — E1169 Type 2 diabetes mellitus with other specified complication: Secondary | ICD-10-CM

## 2024-10-07 DIAGNOSIS — G4733 Obstructive sleep apnea (adult) (pediatric): Secondary | ICD-10-CM

## 2024-10-07 DIAGNOSIS — J9611 Chronic respiratory failure with hypoxia: Secondary | ICD-10-CM

## 2024-10-07 DIAGNOSIS — Z5320 Procedure and treatment not carried out because of patient's decision for unspecified reasons: Secondary | ICD-10-CM | POA: Insufficient documentation

## 2024-10-07 DIAGNOSIS — I872 Venous insufficiency (chronic) (peripheral): Secondary | ICD-10-CM

## 2024-10-07 LAB — MICROALBUMIN / CREATININE URINE RATIO
Creatinine,U: 39.1 mg/dL
Microalb Creat Ratio: 34.7 mg/g — ABNORMAL HIGH (ref 0.0–30.0)
Microalb, Ur: 1.4 mg/dL (ref 0.0–1.9)

## 2024-10-07 MED ORDER — TRIAMCINOLONE ACETONIDE 0.1 % EX CREA: 1.0000 | TOPICAL_CREAM | Freq: Two times a day (BID) | CUTANEOUS | 1 refills | Status: AC

## 2024-10-07 NOTE — Patient Instructions (Signed)
 Please return in 6 months to recheck diabetes and blood pressure   I will release your lab results to you on your MyChart account with further instructions. You may see the results before I do, but when I review them I will send you a message with my report or have my assistant call you if things need to be discussed. Please reply to my message with any questions. Thank you!   If you have any questions or concerns, please don't hesitate to send me a message via MyChart or call the office at [phone]. Thank you for visiting with us  today! It's our pleasure caring for you.    VISIT SUMMARY: During today's visit, we discussed your ongoing issues with oxygen  use, medication side effects, lower extremity swelling, and skin changes. We also reviewed your diabetes and cholesterol management, and addressed general health maintenance including vaccinations.  YOUR PLAN: -CHRONIC RESPIRATORY FAILURE WITH HYPOXIA AND EMPHYSEMA: This condition means that your lungs are not able to get enough oxygen  into your blood. We discussed ensuring your oxygen  equipment is functioning properly to prevent shortness of breath. You have discontinued Tyvaso  due to side effects, and your oxygen  flow has been increased to 8 liters, which has improved your symptoms.  -TYPE 2 DIABETES MELLITUS WITH HYPERGLYCEMIA AND OTHER SPECIFIED COMPLICATION: This condition means that your body has difficulty managing blood sugar levels. Your diabetes management is going well, and your blood pressure is well-controlled. We will order lab work to monitor your diabetes.  -HYPERLIPIDEMIA MANAGEMENT IN THE CONTEXT OF DIABETES: This condition means you have high cholesterol levels, which can be more challenging to manage with diabetes. We discussed the potential use of Zetia, a medication that can help lower cholesterol levels. You will review the information on Zetia, and we will order lab work to check your cholesterol levels.  -CHRONIC LOWER  EXTREMITY EDEMA WITH SKIN CHANGES AND CONTACT DERMATITIS: This condition means you have persistent swelling in your legs along with skin irritation. We recommend using compression socks daily, taking Lasix  with potassium supplements, and following a low sodium diet. We also prescribed a steroid cream for skin irritation called triamcinolone .  -GENERAL HEALTH MAINTENANCE: You received the flu vaccine but not the shingles vaccine. We discussed the benefits of the shingles vaccine, including potential dementia prevention. If you agree, we will administer the shingles vaccine.  INSTRUCTIONS: We will order lab work to monitor your diabetes and cholesterol levels. Please use the prescribed steroid cream for skin irritation, take Lasix  with potassium supplements daily, and wear compression socks up to the knee. Follow a low sodium diet to help with leg swelling. If you agree, we will administer the shingles vaccine during your next visit.                      Contains text generated by [employer].                                 Contains text generated by [employer].

## 2024-10-07 NOTE — Progress Notes (Signed)
 Subjective  Chief Complaint  Patient presents with   Diabetes   Hypertension    HPI: Patty [name] is a 73 y.o. female who presents to [facility] at [place] today for a Female Wellness Visit. She also has the concerns and/or needs as listed above in the chief complaint. These will be addressed in addition to the Health Maintenance Visit.   Wellness Visit: annual visit with health maintenance review and exam  HM: refuses mammograms. Will think about shingrix . Had flu shot. Eye exam current: macular degeneration.  Chronic disease f/u and/or acute problem visit: (deemed necessary to be done in addition to the wellness visit): Discussed the use of AI scribe software for clinical note transcription with the patient, who gave verbal consent to proceed.  History of Present Illness Patty [name] is a 73 year old female with pulmonary hypertension and diabetes who presents with issues related to oxygen  use and medication side effects.  Hypoxemia and oxygen  therapy due to ILD and emphysema per pulm - Requires supplemental oxygen , but not continuously - Oxygen  tank was empty upon arrival, necessitating assistance to walk into the clinic - Typically carries an extra oxygen  tank in her car - Oxygen  flow was increased to eight liters after completing pulmonary rehabilitation, resulting in improved symptoms  Medication side effects and intolerance - Discontinued Tyvaso  due to neurological and muscle symptoms, including insomnia and a sensation of nervousness - Identified these symptoms as potential side effects of Tyvaso  - Uses clozapine and other supplements to manage insomnia - Sensitive to medications and potential drug interactions, particularly with underlying Crohn's disease  Peripheral edema and skin changes - Lower extremity swelling, suspected to be lymphedema - Uses compression socks and occasionally takes Lasix , but ankle swelling persists - Skin changes  and pruritus, especially behind the ears, initially thought to be related to lymph nodes  Ophthalmologic history - No additional injections required for macular degeneration since last ophthalmology visit  Diabetic control reportedly good. Diet is fair. Diet controlled.  Multiple chronic conditions: HLD: will consider zetia but refuses statin.  Requests to see clinical pharmacist for medication evaluation.  Depression: now on prozac  only. Did not tolerate cymbalta  Crohn's disease: on meds  OSA on cpap Sees hematology next week for h/o PCV and pernicious anemia   Assessment  1. Type 2 diabetes mellitus with hyperglycemia, without long-term current use of insulin  (HCC)   2. ILD (interstitial lung disease) (HCC)   3. Morbid obesity (HCC)   4. Crohn's disease with complication, unspecified gastrointestinal tract location (HCC)   5. OSA on CPAP   6. Pernicious anemia   7. Combined hyperlipidemia associated with type 2 diabetes mellitus (HCC)   8. Chronic respiratory failure with hypoxia (HCC)   9. Pulmonary emphysema with fibrosis of lung (HCC)   10. Refusal of statin medication by patient   11. Venous stasis dermatitis   12. Exudative age-related macular degeneration, left eye, with active choroidal neovascularization [facility])      Plan  Female Wellness Visit: Age appropriate Health Maintenance and Prevention measures were discussed with patient. Included topics are cancer screening recommendations, ways to keep healthy (see AVS) including dietary and exercise recommendations, regular eye and dental care, use of seat belts, and avoidance of moderate alcohol use and tobacco use.  BMI: discussed patient's BMI and encouraged positive lifestyle modifications to help get to or maintain a target BMI. HM needs and immunizations were addressed and ordered. See below for orders. See HM and  immunization section for updates. Routine labs and screening tests ordered including cmp, cbc and lipids where  appropriate. Discussed recommendations regarding Vit D and calcium supplementation (see AVS)  Chronic disease management visit and/or acute problem visit: Assessment and Plan Assessment & Plan Chronic respiratory failure with hypoxia and emphysema Reports recent issues with oxygen  tank leakage, leading to shortness of breath. Completed pulmonary rehab and increased oxygen  to 8 liters, which has improved symptoms. Discontinued Tyvaso  due to neurological and muscle issues, insomnia, and worsening breathing. Neurological and orthopedic evaluations did not reveal any significant findings. - Ensure proper functioning of oxygen  equipment  Type 2 diabetes mellitus with hyperglycemia and other specified complication Reports feeling well with diabetes management. Blood pressure is well-controlled. Cardiologist reports strong cardiac function. - Order lab work for diabetes management  Hyperlipidemia management in the context of diabetes Discussed potential use of Zetia, a non-statin cholesterol-lowering medication, due to sensitivity to medications. Zetia is less potent than statins but may help lower cholesterol levels. She will research Zetia and consider its use. - Provide information on Zetia for her to review - Order cholesterol levels in lab work  Chronic lower extremity edema with skin changes and contact dermatitis Reports chronic swelling in legs with skin changes and itchiness. Rarely takes Lasix . Compression socks help. Possible contact dermatitis behind ears. Discussed importance of compression and low sodium diet. - Prescribe steroid cream for skin irritation - Recommend daily use of Lasix  with potassium supplementation - Advise use of compression socks up to the knee - Encourage low sodium diet  Diabetes: recheck A1c today. Check urine nephropathy screen HLD: recheck non fasting. Rec zetia. Pt to research PharmD consult when pt chooses who she wants to see: requesting specific [facility]  pharmacist.  ILD f/u with pulm   General Health Maintenance Received flu vaccine but not shingles vaccine. Interested in shingles vaccine due to potential dementia prevention benefits. - Discuss shingles vaccine and its benefits - Administer shingles vaccine if she agrees I personally spent a total of 44 minutes in the care of the patient today including preparing to see the patient, getting/reviewing separately obtained history, performing a medically appropriate exam/evaluation, counseling and educating, placing orders, documenting clinical information in the EHR, independently interpreting results, communicating results, and coordinating care.  Follow up: 6 mo for bp and diabetes recheck  Orders Placed This Encounter  Procedures   Comprehensive metabolic panel with GFR   Lipid panel   Hemoglobin A1c   TSH   Microalbumin / creatinine urine ratio   Meds ordered this encounter  Medications   triamcinolone  cream (KENALOG ) 0.1 %    Sig: Apply 1 Application topically 2 (two) times daily. For 2 weeks to legs, then as needed    Dispense:  80 g    Refill:  1      Body mass index is 46.28 kg/m. Wt Readings from Last 3 Encounters:  10/07/24 269 lb 9.6 oz (122.3 kg)  09/24/24 271 lb 9.7 oz (123.2 kg)  09/10/24 272 lb 7.8 oz (123.6 kg)     Patient Active Problem List   Diagnosis Date Noted   Type 2 diabetes mellitus with hyperglycemia, without long-term current use of insulin  (HCC) 12/04/2023    Priority: High   Sleep disorder 01/10/2020    Priority: High    Uses klonopin  for sleep since 2014; sleep walking. And anxiety    Pernicious anemia 01/10/2020    Priority: High   Chronic prescription benzodiazepine use 01/10/2020    Priority: High  Morbid obesity (HCC) 09/24/2019    Priority: High   History of DVT (deep vein thrombosis), with PE 07/25/2019    Priority: High   OSA on CPAP     Priority: High    HST 06/01/17- AHI 33.8/ hr, desaturation to 69%, body weight 240 lbs    CPAP to 8    Crohn disease (HCC) 06/15/2017    Priority: High   ILD (interstitial lung disease) (HCC) 05/29/2017    Priority: High   Osteopenia 08/05/2020    Priority: Medium     H/o pred use Dexa 07/2020: lowest T left femur -2.2; rec cal vit d and exercise. DEXA 04/2023: Lowest T left femur -1.9, continue calcium vitamin D  and exercise.  Recheck 2 to 3 years.    Adjustment disorder with anxiety 04/29/2020    Priority: Medium    Combined hyperlipidemia associated with type 2 diabetes mellitus (HCC) 09/24/2019    Priority: Medium    History of polycythemia vera 11/12/2014    Priority: Medium    Chronic allergic rhinitis 01/10/2020    Priority: Low   Refusal of statin medication by patient 10/07/2024   Venous stasis dermatitis 10/07/2024   Exudative age-related macular degeneration, left eye, with active choroidal neovascularization (HCC) 10/07/2024   WHO group 3 pulmonary arterial hypertension (HCC) 03/04/2024   Muscle spasm 01/25/2024   Gait abnormality 11/14/2023   Low back pain 08/03/2023   Pulmonary emphysema with fibrosis of lung (HCC) 01/31/2022   Sclerosing adenosis of right breast 02/04/2021   Benign neoplasm of rectum    Chronic respiratory failure with hypoxia (HCC) 05/29/2017   Anemia 12/25/2015   Complications due to internal joint prosthesis 11/12/2014   Deep venous thrombosis (HCC) 11/12/2014   Polycythemia vera (HCC) 11/12/2014   Health Maintenance  Topic Date Due   Diabetic kidney evaluation - Urine ACR  Never done   HEMOGLOBIN A1C  09/04/2024   COVID-19 Vaccine (8 - 2025-26 season) 10/23/2024 (Originally 08/26/2024)   Medicare Annual Wellness (AWV)  12/03/2024 (Originally 12/03/2021)   Zoster Vaccines- Shingrix  (1 of 2) 01/07/2025 (Originally 09/03/1970)   Diabetic kidney evaluation - eGFR measurement  11/13/2024   FOOT EXAM  03/04/2025   OPHTHALMOLOGY EXAM  05/23/2025   DEXA SCAN  05/24/2025   Colonoscopy  09/29/2029   Pneumococcal Vaccine: 50+ Years   Completed   Influenza Vaccine  Completed   Hepatitis C Screening  Completed   Meningococcal B Vaccine  Aged Out   Lung Cancer Screening  Discontinued   DTaP/Tdap/Td  Discontinued   Mammogram  Discontinued   Immunization History  Administered Date(s) Administered   Fluad Quad(high Dose 65+) 10/07/2019, 09/09/2020, 11/27/2021, 09/19/2022   Fluad Trivalent(High Dose 65+) 09/25/2023, 09/19/2024   INFLUENZA, HIGH DOSE SEASONAL PF 08/28/2018   Influenza-Unspecified 11/23/2017   PFIZER Comirnaty(Gray Top)Covid-19 Tri-Sucrose Vaccine 09/19/2022   PFIZER(Purple Top)SARS-COV-2 Vaccination 02/16/2020, 03/17/2020, 12/14/2020, 05/14/2021, 10/27/2021   Pfizer(Comirnaty)Fall Seasonal Vaccine 12 years and older 09/21/2023   Pneumococcal Conjugate-13 02/13/2018   Pneumococcal Polysaccharide-23 03/12/2019   Respiratory Syncytial Virus Vaccine,Recomb Aduvanted(Arexvy) 09/24/2022   We updated and reviewed the patient's past history in detail and it is documented below. Allergies: Patient is allergic to tizanidine hcl, victoza  [liraglutide ], metformin  and related, onion, avastin  [bevacizumab ], lasix  [furosemide ], pirfenidone , potassium-containing compounds, prednisone, sulfa drugs cross reactors, and trelegy ellipta  [fluticasone-umeclidin-vilant]. Past Medical History Patient  has a past medical history of Abnormal nuclear stress test, Allergy (1970s), Anxiety, Aortic atherosclerosis, Arthritis, Blood transfusion without reported diagnosis (8006), Cataract (2020), Chronic diastolic CHF (  congestive heart failure) (HCC), Coronary artery calcification seen on CT scan, Crohn disease (HCC), Depression, DVT (deep venous thrombosis) (HCC), Dyspnea, Grade I diastolic dysfunction, Hyperlipidemia, ILD (interstitial lung disease) (HCC), OSA on CPAP, Oxygen  deficiency (2019), Pernicious anemia, Polycythemia vera (HCC), Pulmonary embolism (HCC) (2010), Pulmonary hypertension (HCC), Sleep apnea (2018), and Sleep disorder  (01/10/2020). Past Surgical History Patient  has a past surgical history that includes Hip Arthroplasty (Right); Colon resection (1993); LEFT HEART CATH AND CORONARY ANGIOGRAPHY (N/A, 07/28/2017); Colonoscopy with propofol  (N/A, 09/30/2019); biopsy (09/30/2019); polypectomy (09/30/2019); RIGHT HEART CATH (N/A, 12/07/2020); Cardiac catheterization;  RIGHT BREAST LUMPECTOMY WITH RADIOACTIVE SEED LOCALIZATION (Right Breast) (02/04/2021); Breast lumpectomy with radioactive seed localization (Right, 02/04/2021); Small intestine surgery (1993); Eye surgery (2021); Colon surgery (1993); and Joint replacement (2010). Family History: Patient family history includes Alcohol abuse in her brother and paternal uncle; Anxiety disorder in her father; COPD in her brother; Cancer in her brother; Diabetes in her brother and father; Diabetes Mellitus II in her brother and father; Early death in her brother and paternal grandfather; Glaucoma in her brother and mother; Hearing loss in her mother; Heart disease in her brother; High Cholesterol in her mother; High blood pressure in her mother; Hypertension in her brother, brother, maternal grandfather, and mother; Non-Hodgkin's lymphoma in her brother; Obesity in her maternal grandfather and maternal grandmother; Sleep apnea in her father and mother; Varicose Veins in her mother. Social History:  Patient  reports that she quit smoking about 15 years ago. Her smoking use included cigarettes. She started smoking about 60 years ago. She has a 45 pack-year smoking history. She has never used smokeless tobacco. She reports current alcohol use. She reports that she does not use drugs.  Review of Systems: Constitutional: negative for fever or malaise Ophthalmic: negative for photophobia, double vision or loss of vision Cardiovascular: negative for chest pain,  Respiratory: negative for SOB or persistent cough Gastrointestinal: negative for abdominal pain, change in bowel habits or  melena Genitourinary: negative for dysuria or gross hematuria, no abnormal uterine bleeding or disharge Musculoskeletal: negative for back pain Integumentary: negative for new or persistent rashes, no breast lumps Neurological: negative for TIA or stroke symptoms Psychiatric: negative for SI or delusions Allergic/Immunologic: negative for hives  Patient Care Team    Relationship Specialty Notifications Start End  Jodie Lavern CROME, MD PCP - General Family Medicine  07/13/22   Delford Maude BROCKS, MD PCP - Cardiology Cardiology  11/30/21   Albertus Gordy HERO, MD Consulting Physician Gastroenterology  01/10/20   Shamleffer, Donell Cardinal, MD Consulting Physician Endocrinology  01/10/20   Geronimo Amel, MD Consulting Physician Pulmonary Disease  01/10/20     Objective  Vitals: BP 130/70   Pulse 82   Temp 97.7 F (36.5 C)   Ht 5' 4 (1.626 m)   Wt 269 lb 9.6 oz (122.3 kg)   SpO2 95%   BMI 46.28 kg/m  General:  obese, no distress, Psych:  Alert and orientedx3,normal mood and affect HEENT:  Normocephalic, atraumatic, non-icteric sclera,  supple neck without adenopathy, mass or thyromegaly, rash behind ears Cardiovascular:  Normal S1, S2, RRR without gallop, rub or murmur Respiratory:  fair breath sounds bilaterally, CTAB with normal respiratory effort Ext: + 2 brawny edema with redness and a few sores from scratching   Commons side effects, risks, benefits, and alternatives for medications and treatment plan prescribed today were discussed, and the patient expressed understanding of the given instructions. Patient is instructed to call or message via MyChart  if he/she has any questions or concerns regarding our treatment plan. No barriers to understanding were identified. We discussed Red Flag symptoms and signs in detail. Patient expressed understanding regarding what to do in case of urgent or emergency type symptoms.  Medication list was reconciled, printed and provided to the patient in AVS.  Patient instructions and summary information was reviewed with the patient as documented in the AVS. This note was prepared with assistance of Dragon voice recognition software. Occasional wrong-word or sound-a-like substitutions may have occurred due to the inherent limitations of voice recognition software

## 2024-10-08 ENCOUNTER — Other Ambulatory Visit (INDEPENDENT_AMBULATORY_CARE_PROVIDER_SITE_OTHER)

## 2024-10-08 ENCOUNTER — Encounter: Payer: Self-pay | Admitting: Family Medicine

## 2024-10-08 ENCOUNTER — Telehealth: Payer: Self-pay | Admitting: Family Medicine

## 2024-10-08 DIAGNOSIS — E1165 Type 2 diabetes mellitus with hyperglycemia: Secondary | ICD-10-CM | POA: Diagnosis not present

## 2024-10-08 DIAGNOSIS — Z741 Need for assistance with personal care: Secondary | ICD-10-CM

## 2024-10-08 LAB — BASIC METABOLIC PANEL WITH GFR
BUN: 19 mg/dL (ref 6–23)
CO2: 30 meq/L (ref 19–32)
Calcium: 9.3 mg/dL (ref 8.4–10.5)
Chloride: 102 meq/L (ref 96–112)
Creatinine, Ser: 0.73 mg/dL (ref 0.40–1.20)
GFR: 81.2 mL/min (ref >60.00)
Glucose, Bld: 143 mg/dL — ABNORMAL HIGH (ref 70–99)
Potassium: 4 meq/L (ref 3.5–5.1)
Sodium: 140 meq/L (ref 135–145)

## 2024-10-08 LAB — LIPID PANEL
Cholesterol: 188 mg/dL (ref 0–200)
HDL: 58.5 mg/dL (ref >39.00)
LDL Cholesterol: 98 mg/dL (ref 0–99)
NonHDL: 129.54
Total CHOL/HDL Ratio: 3
Triglycerides: 159 mg/dL — ABNORMAL HIGH (ref 0.0–149.0)
VLDL: 31.8 mg/dL (ref 0.0–40.0)

## 2024-10-08 LAB — HEMOGLOBIN A1C: Hgb A1c MFr Bld: 6.9 % — ABNORMAL HIGH (ref 4.6–6.5)

## 2024-10-08 LAB — TSH: TSH: 0.73 u[IU]/mL (ref 0.35–5.50)

## 2024-10-08 NOTE — Telephone Encounter (Signed)
 Spoke with Waddell: likely hemolyzed specimen. Rec recall pt for stat redraw: BMP.

## 2024-10-08 NOTE — Telephone Encounter (Signed)
 CRITICAL VALUE STICKER  CRITICAL VALUE: Potassium 8.5   RECEIVER (on-site recipient of call): Waddell Salles  DATE & TIME NOTIFIED: 10/08/2024 1:26pm  MESSENGER (representative from lab): Darice  MD NOTIFIED: Lavern Heck  TIME OF NOTIFICATION: 1:29pm  RESPONSE: Recollect stat today send to [facility] lab. Called patient and left detailed voicemail for patient.

## 2024-10-08 NOTE — Telephone Encounter (Signed)
 Spoke with Darice at [place] lab. Potassium is 4.0 results are in Epic. First result was incorrect.

## 2024-10-11 ENCOUNTER — Ambulatory Visit: Payer: Self-pay | Admitting: Family Medicine

## 2024-10-11 DIAGNOSIS — R809 Proteinuria, unspecified: Secondary | ICD-10-CM

## 2024-10-11 NOTE — Progress Notes (Signed)
 See mychart note Dear Ms. Filkins, As you know, your follow-up blood test showed normal potassium. Your kidney function is normal however you are spilling a tiny bit of protein in your urine.  You can consider starting a kidney protective medicine.  Please schedule a visit if you would like to discuss this further.  Your diabetic control remains fairly good although your A1c is up to 6.9.  If this goes any higher, I would recommend medications.  Please follow your diabetic diet.  Your other blood test look normal. Sincerely, Dr. Jodie

## 2024-10-11 NOTE — Assessment & Plan Note (Signed)
 Diet controlled, mild microalbuminuria October 2025.  Consider ACE or SGLT2.  To discussed with patient.

## 2024-10-15 ENCOUNTER — Telehealth: Payer: Self-pay | Admitting: *Deleted

## 2024-10-15 NOTE — Progress Notes (Unsigned)
 Care Guide Pharmacy Note  10/15/2024 Name: Dynasty Holquin MRN: [id number] DOB: 1951-01-09  Referred By: Jodie Lavern CROME, MD Reason for referral: Call Attempt #1 and Complex Care Management (Outreach to schedule referral with pharmacist )   Ronae Noell is a 73 y.o. year old female who is a primary care patient of Jodie Lavern CROME, MD.  Devere Jenkins [place] was referred to the pharmacist for assistance related to: DMII  An unsuccessful telephone outreach was attempted today to contact the patient who was referred to the pharmacy team for assistance with medication management. Additional attempts will be made to contact the patient.  Thedford Franks, CMA [place]  [facility], [facility] Guide Direct Dial: [phone]  Fax: [phone] Website: [email or web address]

## 2024-10-16 NOTE — Progress Notes (Unsigned)
 Care Guide Pharmacy Note  10/16/2024 Name: [name] MRN: [id number] DOB: [birth year]  Referred By: Jodie Lavern CROME, MD Reason for referral: Call Attempt #1 and Complex Care Management (Outreach to schedule referral with pharmacist )   Patty [name] is a 73 y.o. year old female who is a primary care patient of Jodie Lavern CROME, MD.  Devere Jenkins [place] was referred to the pharmacist for assistance related to: DMII  A second unsuccessful telephone outreach was attempted today to contact the patient who was referred to the pharmacy team for assistance with medication management. Additional attempts will be made to contact the patient.  Thedford Franks, CMA [place]  [facility], [facility] Guide Direct Dial: [phone]  Fax: [phone] Website: [email or web address]

## 2024-10-17 NOTE — Progress Notes (Signed)
 Care Guide Pharmacy Note  10/17/2024 Name: Caden Fatica MRN: [id number] DOB: [birth year]  Referred By: Jodie Lavern CROME, MD Reason for referral: Call Attempt #1 and Complex Care Management (Outreach to schedule referral with pharmacist )   Dejanee Thibeaux is a 73 y.o. year old female who is a primary care patient of Jodie Lavern CROME, MD.  Devere Jenkins [place] was referred to the pharmacist for assistance related to: DMII  A third unsuccessful telephone outreach was attempted today to contact the patient who was referred to the pharmacy team for assistance with medication management. The Population Health team is pleased to engage with this patient at any time in the future upon receipt of referral and should he/she be interested in assistance from the Population Health team.  Thedford Franks, CMA [facility]  [facility], [facility] Guide Direct Dial: [phone]  Fax: [phone] Website: [email or web address]

## 2024-10-30 ENCOUNTER — Other Ambulatory Visit (HOSPITAL_COMMUNITY): Payer: Self-pay

## 2024-10-31 ENCOUNTER — Telehealth: Payer: Self-pay | Admitting: *Deleted

## 2024-10-31 NOTE — Progress Notes (Signed)
 Complex Care Management Note Care Guide Note  10/31/2024 Name: Patty [name] MRN: [id number] DOB: [birth year]   Complex Care Management Outreach Attempts: An unsuccessful telephone outreach was attempted today to offer the patient information about available complex care management services.  Follow Up Plan:  Additional outreach attempts will be made to offer the patient complex care management information and services.   Encounter Outcome:  No Answer  Thedford Franks, CMA [place]  [facility], [facility] Guide Direct Dial: [phone]  Fax: [phone] Website: [email or web address]

## 2024-11-04 ENCOUNTER — Ambulatory Visit (INDEPENDENT_AMBULATORY_CARE_PROVIDER_SITE_OTHER)

## 2024-11-04 DIAGNOSIS — G4733 Obstructive sleep apnea (adult) (pediatric): Secondary | ICD-10-CM

## 2024-11-04 DIAGNOSIS — J849 Interstitial pulmonary disease, unspecified: Secondary | ICD-10-CM | POA: Diagnosis not present

## 2024-11-04 DIAGNOSIS — J9611 Chronic respiratory failure with hypoxia: Secondary | ICD-10-CM

## 2024-11-04 DIAGNOSIS — I2723 Pulmonary hypertension due to lung diseases and hypoxia: Secondary | ICD-10-CM

## 2024-11-04 DIAGNOSIS — J439 Emphysema, unspecified: Secondary | ICD-10-CM

## 2024-11-04 LAB — PULMONARY FUNCTION TEST
DL/VA % pred: 60 %
DL/VA: 2.5 ml/min/mmHg/L
DLCO unc % pred: 54 %
DLCO unc: 10.11 ml/min/mmHg
FEF 25-75 Pre: 0.62 L/s
FEF2575-%Pred-Pre: 37 %
FEV1-%Pred-Pre: 60 %
FEV1-Pre: 1.24 L
FEV1FVC-%Pred-Pre: 75 %
FEV6-%Pred-Pre: 81 %
FEV6-Pre: 2.12 L
FEV6FVC-%Pred-Pre: 103 %
FVC-%Pred-Pre: 79 %
FVC-Pre: 2.18 L
Pre FEV1/FVC ratio: 57 %
Pre FEV6/FVC Ratio: 98 %

## 2024-11-04 NOTE — Progress Notes (Signed)
Spiro/DLCO performed today. 

## 2024-11-04 NOTE — Patient Instructions (Signed)
Spiro/DLCO performed today. 

## 2024-11-04 NOTE — Progress Notes (Signed)
 Care Guide Pharmacy Note  11/04/2024 Name: Patty [name] MRN: [id number] DOB: [birth year]  Referred By: Jodie Lavern CROME, MD Reason for referral: Call Attempt #1 and Complex Care Management (Outreach to schedule initial with pharmacist )   Patty [name] is a 73 y.o. year old female who is a primary care patient of Jodie Lavern CROME, MD.  Patty [name] was referred to the pharmacist for assistance related to: DMII  Successful contact was made with the patient to discuss pharmacy services including being ready for the pharmacist to call at least 5 minutes before the scheduled appointment time and to have medication bottles and any blood pressure readings ready for review. The patient agreed to meet with the pharmacist via telephone visit on 11/08/2024  Thedford Franks, CMA [place]  [facility], [facility] Guide Direct Dial: [phone]  Fax: [phone] Website: [email or web address]

## 2024-11-08 ENCOUNTER — Other Ambulatory Visit: Payer: Self-pay | Admitting: Pharmacist

## 2024-11-08 NOTE — Progress Notes (Signed)
 11/08/2024 Name: Patty [name] MRN: [id number] DOB: January 30, 1951  Chief Complaint  Patient presents with   Medication Management    Patty [name] is a 73 y.o. year old female who presented for a telephone visit.   They were referred to the pharmacist by their PCP and self referred for assistance in managing weight management and medication questions.    Subjective:  Patient would like to discuss weight loss medications and medications to treat proteinuria.  Patient specifically asked about phentermine because she has a friend that has taken phentermine with good results. She also had many other medication and lab questions.   Care Team: Primary Care Provider: Jodie Lavern CROME, MD ; Next Scheduled Visit: not currently scheduled Cardiologist: Dr Delford; Next Scheduled Visit: not currently scheduled Ophthalmologist: Dr Fleeta at [facility]  Medication Access/Adherence  Current Pharmacy:  [id number] - [place], [place] - [address] AT [facility] [address] [place] [address] Phone: [phone] Fax: [phone]  [facility] - [place], [place] - [address] [facility] [address] [place] [address] Phone: [phone] Fax: [phone]   Patient reports affordability concerns with their medications: Yes  Patient reports access/transportation concerns to their pharmacy: No  Patient reports adherence concerns with their medications:  No      Diabetes: - A1c has been in prediabetes range in past but last A1c was 6.9%  Current medications: none  Medications tried in the past: she took metformin  for weight loss but stopped due to diarrhea; took Victoza  - stopped due to GI issues per patient but her allergy list states she had shortness of breath with Victoza .  Topiramate  - took in 2021 but patient did not recall why it was stopped.  Bupropion - took in 2015. Patient thinks she was not able to tolerate bupropion but she was  not sure why.   Patient asks about taking Ozempic or other GLP1 type medications for weight loss.   Current physical activity: just finished pulmonary PT  Current medication access support: none  Macrovascular and Microvascular Risk Reduction:  Statin? no; patient declined statin therapy; ACEi/ARB? Not currently but discussed today. Last urinary albumin/creatinine ratio:  Lab Results  Component Value Date   MICRALBCREAT 34.7 (H) 10/07/2024   Last eye exam:  Lab Results  Component Value Date   HMDIABEYEEXA No Retinopathy 05/05/2023   Last foot exam: 03/04/2024 Tobacco Use:  Tobacco Use: Medium Risk (10/07/2024)   Patient History    Smoking Tobacco Use: Former    Smokeless Tobacco Use: Never    Passive Exposure: Not on file   Obesity/Overweight, Complicated by pulmonary arterial hypertension, Interstitial lung disease, Crohn's disease, type 2 DM, low back pain  Current medications: none  Weight Management treatments previously prescribed: metformin  - stopped due to diarrhea; Victoza  - stopped due to GI issues per patient but her allergy list states she had shortness of breath with Victoza .  Current meal patterns:  Patient reports she does not eat large meals. She eats vegetable frequently.    Wt Readings from Last 3 Encounters:  10/07/24 269 lb 9.6 oz (122.3 kg)  09/24/24 271 lb 9.7 oz (123.2 kg)  09/10/24 272 lb 7.8 oz (123.6 kg)   BP Readings from Last 3 Encounters:  10/07/24 130/70  07/10/24 138/80  05/29/24 118/70      Objective:  Lab Results  Component Value Date   HGBA1C 6.9 (H) 10/07/2024    Lab Results  Component Value Date   CREATININE  0.73 10/08/2024   BUN 19 10/08/2024   NA 140 10/08/2024   K 4.0 10/08/2024   CL 102 10/08/2024   CO2 30 10/08/2024    Lab Results  Component Value Date   CHOL 188 10/07/2024   HDL 58.50 10/07/2024   LDLCALC 98 10/07/2024   TRIG 159.0 (H) 10/07/2024   CHOLHDL 3 10/07/2024    Medications Reviewed Today      Reviewed by Carla Milling, RPH-CPP (Pharmacist) on 11/08/24 at 1545  Med List Status: <None>   Medication Order Taking? Sig Documenting Provider Last Dose Status Informant  albuterol  (VENTOLIN  HFA) 108 (90 Base) MCG/ACT inhaler [id number]  INHALE 2 PUFFS INTO THE LUNGS EVERY 6 HOURS AS NEEDED FOR WHEEZING OR SHORTNESS OF SHERIDA Geronimo Amel, MD  Active   amoxicillin  (AMOXIL ) 500 MG capsule [id number]  Take 4 capsules (2,000 mg total) by mouth once as needed for up to 1 dose. Before dental procedures Jodie Lavern CROME, MD  Active   aspirin  EC 81 MG tablet [id number]  Take 81 mg by mouth daily. [provider]  Active Self  Calcium Carbonate-Vit D-Min (CALCIUM 1200) 1200-1000 MG-UNIT CHEW [id number]  Chew 1 tablet by mouth daily. [provider]  Active Self  CANNABIDIOL PO [id number]  Take 1 Dose by mouth 2 (two) times daily as needed (anxiety/sleep.). CBD Oil [provider]  Active Self  cetirizine -pseudoephedrine (ZYRTEC -D) 5-120 MG tablet [id number]  Take 1 tablet by mouth daily as needed (sinus headaches.). [provider]  Active Self           Med Note DEL, ARACELY   Wed Jan 13, 2021  1:45 PM)    clonazePAM  (KLONOPIN ) 0.5 MG tablet [id number]  Take 1 tablet (0.5 mg total) by mouth daily as needed for anxiety. Jodie Lavern CROME, MD  Active   cyanocobalamin  (VITAMIN B12) 1000 MCG/ML injection [id number]  Inject 1 mL (1,000 mcg total) into the muscle every 30 (thirty) days. Jodie Lavern CROME, MD  Active   dorzolamide (TRUSOPT) 2 % ophthalmic solution [id number]  Place 1 drop into the right eye 2 (two) times daily. [provider]  Active   FLUoxetine  (PROZAC ) 20 MG capsule [id number] Yes Take 1 capsule (20 mg total) by mouth daily. Jodie Lavern CROME, MD  Active   fluticasone OREN) 50 MCG/ACT nasal spray [id number]  Place 1-2 sprays into both nostrils daily as needed for allergies or rhinitis. [provider]  Active Self  furosemide  (LASIX ) 20  MG tablet [id number] Yes TAKE 1 TABLET BY MOUTH DAILY AS NEEDED FOR FLUID OR SWELLING. Nishan, Peter C, MD  Active   gabapentin  (NEURONTIN ) 100 MG capsule [id number]  Take 3 capsules (300 mg total) by mouth at bedtime. Onita Duos, MD  Active   hydrocortisone  (PROCTOZONE -HC) 2.5 % rectal cream [id number]  Place 1 application rectally 2 (two) times daily as needed (Crohn's flare). Jodie Lavern CROME, MD  Active   ibuprofen (ADVIL) 200 MG tablet [id number]  Take 400-800 mg by mouth every 8 (eight) hours as needed (pain.). [provider]  Active Self  mesalamine (PENTASA) 250 MG CR capsule [id number]  Take 250-500 mg by mouth 4 (four) times daily as needed (crohn's disease flare ups). [provider]  Active Self  Multiple Vitamins-Minerals (ICAPS AREDS 2 PO) [id number]  Take 1 tablet by mouth daily. [provider]  Active Self  potassium chloride  (MICRO-K ) 10 MEQ CR capsule [id number] Yes Take 1 capsule (10 mEq total) by mouth daily as needed (when taking  furosemide ). Nishan, Peter C, MD  Active   prednisoLONE acetate (PRED FORTE) 1 % ophthalmic suspension [id number]  Place 1 drop into both eyes 2 (two) times daily as needed (Uveitis). [provider]  Active Self  Probiotic Product (PROBIOTIC PO) [id number]  Take 1 capsule by mouth 2 (two) times a week. As needed [provider]  Active Self  SYRINGE-NEEDLE, DISP, 3 ML (LUER LOCK SAFETY SYRINGES) 25G X 1 3 ML MISC [id number]  USE AS DIRECTED Jodie Lavern CROME, MD  Active   traMADol  (ULTRAM ) 50 MG tablet [id number]  Take 1 tablet (50 mg total) by mouth every 6 (six) hours as needed. Vernetta Berg, MD  Active   triamcinolone  cream (KENALOG ) 0.1 % [id number]  Apply 1 Application topically 2 (two) times daily. For 2 weeks to legs, then as needed Jodie Lavern CROME, MD  Active               Assessment/Plan:   Diabetes: - Currently controlled; goal A1c <7%. Cardiorenal risk reduction is opportunities for improvement..  Blood pressure is at goal <130/80. LDL is < 100.  - Discussed benefits of SGLT2 and GLP1 agents on blood glucose, weight, renal and cardiovascular disease. Discussed potential side effects and costs.  - Discussed side effects of gastrointestinal upset/nausea; eating smaller meals, avoiding high-fat foods, and remaining upright after eating may reduce nausea. Discussed that overeating is a major trigger of nausea with this class of medications, as often times patients will start to feel full sooner and may need to decrease portion sizes from what they were previously accustomed to. , Discussed potential side effects of dehydration, genitourinary infections., Encouraged adequate hydration and genital hygiene.   Obesity/Overweight: Currently unable to achieve goal weight loss of 5-10% through diet and lifestyle modifications alone - Discussed pros and cons of several weight loss medications. Patient has a history of sensitivity to medication side effects.  - Phentermine discussed - hesitant to use phentermine due to history of glaucoma and possibility to increase HR, blood pressure and affect sleep. Recommended patient ask ophthalmologist type of glaucoma - Orlistat not a good option due to GI effect and patient has had surgery for Crohn's to remove portion of small and large intestine.  - Wellbutrin / bupropion has taken for weight loss in past - patient thinks she has taken bupropion in past and had difficulty tolerating it.  - topiramate  / Topamax  is a possible option that would help with weight loss. She has taken in past but no notation that she has adverse effect.  - Recommended she continue to exercise - goal of 150 minutes per week or as tolerated.  - Will reach out to PCP for thoughts / recommendations about medication for diabetes / Proteinuria and weight.   Answered patient's questions about last TSH and thyroid  function.  Discussed vitamin D  and kidney function / disease.  Discussed 2026  Medicare Part D benefits - I think there might be a better [facility] plan that would cover medications list GLP1s and SGLT2s better.  Patient to call to speak to [facility] representative.   Follow Up Plan: 2 to 3 weeks.   Madelin Ray, PharmD Clinical Pharmacist [facility]  Population Health [phone]

## 2024-11-12 ENCOUNTER — Telehealth: Payer: Self-pay | Admitting: Internal Medicine

## 2024-11-12 ENCOUNTER — Ambulatory Visit: Admitting: Internal Medicine

## 2024-11-12 ENCOUNTER — Encounter: Payer: Self-pay | Admitting: Internal Medicine

## 2024-11-12 VITALS — BP 110/72 | HR 79 | Temp 97.9°F | Ht 64.0 in | Wt 273.0 lb

## 2024-11-12 DIAGNOSIS — Z87891 Personal history of nicotine dependence: Secondary | ICD-10-CM

## 2024-11-12 DIAGNOSIS — J9611 Chronic respiratory failure with hypoxia: Secondary | ICD-10-CM

## 2024-11-12 DIAGNOSIS — R0609 Other forms of dyspnea: Secondary | ICD-10-CM

## 2024-11-12 DIAGNOSIS — Z23 Encounter for immunization: Secondary | ICD-10-CM | POA: Diagnosis not present

## 2024-11-12 DIAGNOSIS — J849 Interstitial pulmonary disease, unspecified: Secondary | ICD-10-CM | POA: Diagnosis not present

## 2024-11-12 DIAGNOSIS — G4733 Obstructive sleep apnea (adult) (pediatric): Secondary | ICD-10-CM | POA: Diagnosis not present

## 2024-11-12 DIAGNOSIS — J439 Emphysema, unspecified: Secondary | ICD-10-CM

## 2024-11-12 DIAGNOSIS — I2723 Pulmonary hypertension due to lung diseases and hypoxia: Secondary | ICD-10-CM

## 2024-11-12 NOTE — Progress Notes (Addendum)
 @Patient  ID: Patty [name], female    DOB: 1951-07-03, 73 y.o.   MRN: [id number]  Chief Complaint  Patient presents with   Follow-up    dyspnea     Referring provider: No ref. provider found  HPI: 73 year old female former smoker seen for pulmonary consulokay guarded because then maybe they can meet him right now for the registryt 05/09/2017 for progressive dyspnea since 2004.SABRA Patient has polycythemia vera followed by hematology and [place] She has Crohn's disease    IOV  05/09/2017  Chief Complaint  Patient presents with   Pulmonary Consult    Pt referred for SOB with activity x years. Pt states over the last year the DOE has worsened. Pt denies cough and CP/tightness and f/c/s.     73 year old obese female. In 2004 Started noticing insidious onset of shortness of breath. In 2005 more [place] from [place] and probably gain over 60 pounds of weight. She has extensive workup at that time according to her history. She was then diagnosed with polycythemia by Dr. Floyd at [facility]. She does not recollect any phlebotomies for this. However in the last 4-5 years she's had worsening shortness of breath. Noticeable with exertion particularly in the gym and doing standing exercises but not so much with sitting exercises. Relieved by rest. When she does some yard work she notices some associated wheezing as well.  Walking desaturation test in the office she did desaturate to 88% and did get tachycardic. She says that a chest x-ray done by primary care physicians interstitial pulmonary fibrosis. I personally visualized the chest x-ray done 04/03/2017: There is some interstitial markings but it is obscured by her obesity. I'm not so certain that is definite ILD. There is no lab work in the record. There is no PFT a CT scan available for visualization     05/29/2017 Follow up : Dyspnea  Patient returns for a two-week follow-up. Patient was seen for pulmonary consult 05/09/2017  for progressive dyspnea since 2004. Patient was set up for a high resolution CT chest that showed a very mild basilar predominant subpleural reticulation and bronchiectasis which could be due to nonspecific interstitial pneumonitis.. CT did have incidental findings of enlarged pulmonary arteries and aortic and coronary calcification. Patient had pulmonary function test on May 23 that showed an FEV1 at 77%, ratio 73, FVC 81, no significant bronchodilator response, total lung capacity 100%, DLCO 52%. Overnight oximetry test did show significant desaturations. We discussed beginning oxygen  at bedtime.  Patient denies significant cough. Says that she get short of breath with walking. Has daytime fatigue and low energy..  Interstitial lung disease patient questionnaire was completed as follows Patient has no previous use of Macrodantin, amiodarone, methotrexate. She has been treated with prednisone for her Crohn's in the past. Patient has had extensive travel with brief visits to multiple state and country's.. [place]. Did live in a apartment that she did notice mold for 4 years. She does have Crohn's disease and was on Mesamaline for many years. Patient says she has had a humidifier and hot tub and previous house. She has no indoor birds. She does have a dog. Patient says she was told that she had a blood clot in the past but was never found.   OV 08/15/2017  Chief Complaint  Patient presents with   Follow-up    Pt still gets occ. SOB. Other than that, pt states that she has been doing good. Denies any  cough or CP.     Follow-up multifactorial dyspnea with interstitial lung disease concern.   Regarding interstitial lung disease concern: Mid May 2018 she had high resolution CT chest that showed interstitial lung disease very mild but unclear if possible UIP pattern. Autoimmune test was negative. Isolated reduction in diffusion capacity 50%. [employer] of chest physicians  questionnaire shows previous Crohn's disease and also mold exposure previously. Overall she feels stable  Regarding dyspnea: She underwent echocardiogram in June 2018 showed grade 1 diastolic dysfunction. Had abnormal cardiac stress test July 2018 followed by cardiac cath early August 2018 that showed normal coronary angiogram but elevated left ventricular end-diastolic pressure. Dietary therapy has been recommended but she is unaware of these results.  Overall she is here to discuss these results.  IMPRESSION: 1. Suspect very mild basilar predominant subpleural reticulation and bronchiolectasis, which may be due to nonspecific interstitial pneumonitis. 2. Aortic atherosclerosis (ICD10-170.0). Coronary artery calcification. 3. Enlarged pulmonary arteries, indicative of pulmonary arterial hypertension.     Electronically Signed   By: Newell Eke M.D.   On: 05/13/2017 07:33    OV 10/17/2017  Chief Complaint  Patient presents with   Follow-up    Pt states that she has had good days and bad days since last visit. States that when she was in [place]  for a month, breathing was better; still became SOB but not as bad as in [place]. Pt's SOB is mainly on exertion. Denies any cough or CP.    Follow-up dyspnea that is multifactorial due to obesity, physical deconditioning and diastolic dysfunction Follow-up mild interstitial lung disease not otherwise specified  Last visit I started her on Lasix . I was only supposed to see herin 6 months or so. However she's had problems tolerating Lasix  and potassium. It fluctuates between palpitations and hypertension. She feels the palpitations might be related to potassium depletion after taking Lasix . But she also tells me that the Lasix  does help her dyspnea. At this point in time her condition is to see cardiologist. Off note she did send the email message asking these questions on 09/28/2017 made a reply that she tells me that she never got the  reply.   OV 02/13/2018  Chief Complaint  Patient presents with   Follow-up    PFT done 02/05/18.  Pt states she has been doing good. Was sick in january but states she is doing better.  DME: [facility] 4L pulse    Follow-up dyspnea that is multifactorial due to obesity, physical deconditioning and diastolic dysfunction Follow-up mild interstitial lung disease not otherwise specified v concern   73 year old female with concern for interstitial lung disease.  Since her last visit she continues to do well.  She is attending pulmonary rehabilitation.  She feels better and less short of breath.  She uses oxygen  with exertion at rehab saying she needs it.  She has upcoming travel in summer to [place]  and wanted some flight information oxygen  form filled out.  She is wondering if mesalamine that she took several years ago was the cause of interstitial lung disease or her Crohn's disease.  But overall she is stable.  Pulmonary function test shows stability and FVC since last 1 year with some improvement in DLCO.  High-resolution CT scan of the chest interpreted by thoracic radiology report suspected ILD.   OV 08/28/2018  Chief Complaint  Patient presents with   Follow-up    PFT performed today.  Pt states she has had both good and bad days since  last visit. Pt denies any complaints of cough, SOB, or CP but states she is having some problems with congestion since she has been back from vacation. Pt has been having problems with getting O2 supplies taken care of with DME.    Follow-up dyspnea that is multifactorial due to obesity, physical deconditioning and diastolic dysfunction Follow-up concern for  mild interstitial lung disease not otherwise specified  Patty [name] - Presents for follow-up for the above issues. Her dyspnea significantly better following a 30 pound weight loss and continued rehabilitation. She uses oxygen  with rehabilitation. Today walking desaturation test 185 feet 3 laps on room  air: She dropped to 87% on the second lap. But she is feeling better. She had spirometry today that shows improvement in FVC concomitant with weight loss but no change in diffusion capacity that reflects the presence of ongoing possible ILD.She continues to lose more weight she believes that she could lose another 30 pounds before the next visit.     OV 03/12/2019  Subjective:  Patient ID: Patty [name], female , DOB: [birth year] , age 38 y.o. , MRN: [id number] , ADDRESS: [address] [place] [address]   03/12/2019 -   Chief Complaint  Patient presents with   Follow-up    PFT performed today.  Pt states she has been doing good since last visit. States she still becomes SOB with exertion, and has phlegm in the mornings, postnasal drainage which has used flonase to help. Pt also uses POC as needed.    Follow-up dyspnea that is multifactorial due to obesity, physical deconditioning and diastolic dysfunction Follow-up  mild interstitial lung disease not otherwise specifieds -with stability 2018 through 2020 [non-IPF pattern]; on observation   HPI Brecken Walth 73 y.o. -presents for follow-up.  She is attending pulmonary rehabilitation and using oxygen  with exertion.  Overall she feels stable.  Subjective symptom parameters and objective parameters are documented below.  She had high-resolution CT chest that showed shows that she has ILD.  At this point we can be confident that she has mild ILD but it is stable based on symptoms walking test and pulmonary function test.  The previous autoimmune test was negative.      OV 10/07/2019  Subjective:  Patient ID: Patty Jenkins [name], female , DOB: 10-28-1951 , age 1 y.o. , MRN: [id number] , ADDRESS: [address] [place] [address]   10/07/2019 -   Chief Complaint  Patient presents with   Follow-up     Follow-up dyspnea that is multifactorial due to obesity, physical deconditioning and diastolic dysfunction Follow-up  mild  interstitial lung disease not otherwise specifieds -with stability 2018 through 2020 [non-IPF pattern]; on observation Follow-up mild pulmonary emphysema present on high-resolution CT chest  HPI Shelma Eiben 73 y.o. -presents for follow-up.  Prior visit was pre-pandemic.  Since then she has been sedentary at home.  She tells me that she is more short of breath.  The symptom scores below reflect that.  She thinks is because of the 30 pound weight gain because of sedentary living and social isolation following the onset of the pandemic.  She is frustrated by this.  Deep down she thinks her ILD is stable.  Pulmonary function test shows a drop in FVC but stability and DLCO suggesting weight gain is an ongoing issue for worsening dyspnea and pulmonary function test.  She is willing to have a high-dose flu shot today.  She also tells me that she is not taking her Spiriva  because  it was expensive.  Although it did help her she is willing to try again and try to price it.  She also stated that her mom who never smoked had emphysema but her dad smoked heavily.  She has never been tested for alpha-1.   [place] destats -dropped. But tries not to use it in public    IMPRESSION: 1. Very mild scattered basilar subpleural reticulation and ground-glass similar to 01/29/2018. Nonspecific interstitial pneumonitis can not be excluded. Findings are indeterminate for UIP per consensus guidelines: Diagnosis of Idiopathic Pulmonary Fibrosis: An Official ATS/ERS/JRS/ALAT Clinical Practice Guideline. Am JINNY Honey Crit Care Med Vol 198, Iss 5, [id number], Aug 26 2017. 2. Question cirrhosis. 3. Aortic atherosclerosis (ICD10-170.0). Coronary artery calcification. 4. Enlarged arteries, arterial hypertension. 5.  Emphysema (ICD10-J43.9).     Electronically Signed   By: Newell Eke M.D.   On: 03/06/2019 16:59   ROS - per HPI     OV 06/23/2020  Subjective:  Patient ID: Patty Jenkins [name], female ,  DOB: April 08, 1951 , age 35 y.o. , MRN: [id number] , ADDRESS: [address] [place] [address]   06/23/2020 -   Chief Complaint  Patient presents with   Follow-up    shortness of breath with exertion   Follow-up dyspnea that is multifactorial due to obesity, physical deconditioning and diastolic dysfunction  Follow-up  mild interstitial lung disease not otherwise specifieds -with stability 2018 through 2020 [non-IPF pattern/indeterminate   -; on observation  - last CT Mach 2020 0> July 2021 wthout progression  Follow-up mild pulmonary emphysema present on high-resolution CT chest - alpha 1 MM  - o2 with exertion  - advised spiriva  oct 2020 but too expensive  HPI Shontae Rosiles 73 y.o. -returns for routine follow-up.  She says she has been doing well.  She uses portable oxygen  cranks it up to 4 L - 5 L and then walks every day for 30 minutes.  Overall she feels stable.  Her symptom score itself is stable.  However on pulmonary function testing her FVC shows a decline.  This decline started in October 2020 and since then it is stable.  She attributes this to weight gain.  There is what she told me last time as well.  However when we walked her she seemed to desaturate much more easily.  We also needed to corrected desaturation at 5 L.  She thinks it is because the portable oxygen  system is not delivering oxygen  currently and is a technical issue with a portable machine.  However the distance of desaturation seems to worsen.  There is no leg swelling although she has some mild varicose veins at baseline.  She is not on nighttime oxygen  and she recollects attest that this some years ago and was normal back then.  Currently she is only using portable oxygen .  Her last echocardiogram was few years ago.  There is no hemoptysis or worsening cough.  She got a Covid vaccine in February 2021 and she thinks things might of changed since then.   After she left I was able to review the home  palliative care note from 01/11/2022:.  She expressed her life wishes that she does not want to be put on the ventilator.  Does not want a feeding tube.  She likes to pass away peacefully like her mother a DNR and MOST form were given.  They will address this in the future with her.  Results for FRONA, YOST ANN (MRN [id number]) as of 06/23/2020 11:49  Ref. Range 05/17/2017 13:11 02/05/2018 12:14 08/28/2018 11:22 03/12/2019 14:47 10/01/2019 15:58 30 pound weight gain 06/09/2020 11:25  FVC-Pre Latest Units: L 2.61 2.57 2.80 2.73 2.38 2.42  FVC-%Pred-Pre Latest Units: % 84 83 94 91 81 82  FEV1-Pre Latest Units: L 1.83 1.71 1.96 1.83 1.65 1.53   Results for MARCELA, ALATORRE ANN (MRN [id number]) as of 06/23/2020 11:49  Ref. Range 05/17/2017 13:11 02/05/2018 12:14 08/28/2018 11:22 03/12/2019 14:47 10/01/2019 15:58 06/09/2020 11:25  DLCO unc Latest Units: ml/min/mmHg 12.64 15.15 14.53 13.77 14.39 13.48  DLCO unc % pred Latest Units: % 52 62 62 72 75 70    ROS - per HPI     OV 02/23/2021  Subjective:  Patient ID: Pulling Jenkins [place], female , DOB: 12-13-1951 , age 50 y.o. , MRN: [id number] , ADDRESS: [address] [place] [address] PCP Jodie Lavern CROME, MD Patient Care Team: Jodie Lavern CROME, MD as PCP - General (Family Medicine) Pyrtle, Gordy HERO, MD as Consulting Physician (Gastroenterology) Shamleffer, Donell Cardinal, MD as Consulting Physician (Endocrinology) Geronimo Amel, MD as Consulting Physician (Pulmonary Disease)  This Provider for this visit: Treatment Team:  Attending Provider: Geronimo Amel, MD   Follow-up dyspnea that is multifactorial due to obesity, physical deconditioning and diastolic dysfunction, ILD and new dx Group 3 PAH - dec 2021  45 pppd prior smoking hix  Follow-up  mild interstitial lung disease not otherwise specifieds -with stability 2018 through 2020 [non-IPF pattern/indeterminate   -; on observation  - last CT Mach 2020 - > July 2021 without  progression  Inconsisent dxx of  pulmonary emphysema present on high-resolution CT chest - alpha 1 MM  - o2 with exertion  - advised spiriva  oct 2020 but too expensive  Reported in only 1 CT. No mention in July 2021 CT  WHO group 3 Pulm htn with elevated PCWD - [facility] 12/07/20:   RA 13/12 mean 9 mmHg RV 48/8, EDP 12 PA 50/29 mean 38 PCWP 22/20 mean 20   O2 saturations: PA 62, Ao 97, SVC 64   CO 4.26 L/m, CI 1.96 Trans-pulmonic gradient 18 mmHg PVR 4.2 Woods Units  Grade 1 Disast dysfhn  - reportred July 2021 echo  Obesity  02/23/2021 -   Chief Complaint  Patient presents with   Follow-up    Doing ok     HPI Odeth Bry 73 y.o. -returns for 29-month follow-up.  She has inconsistent diagnosis of emphysema.  Was reported in 1 scan but not another scans.  She has ILD.  She is foregoing biopsy.  She tells me overall that she is stable.  Although there are some weight gain.  She feels in the interim Metformin  messed up her GI system and cause weight gain but she is glad to be out of it.  She had 6-minute walk test on 02/15/2021 at pulmonary rehabilitation.  She walked 7 and 15 feet in 6 minutes.  She did not break at all.  Her pulse ox dropped below 88% 2 times.  She corrected with 4 L of oxygen .  Her resting oxygen  saturation was 96%.  And lowest was 86%.  She also had right heart catheterization in December 2021.  She has elevated pulmonary pressures but also elevated wedge.  Cardiology is following this up and she is on diuresis.  We spent some time discussing her care option.  She had pulmonary function test that shows stability in the last year and a half but declined in 2 years time.  Her  high-resolution CT scan of the chest done in summer 2021 shows stability over 4 years.  She is not interested in antifibrotic's because of her colitis and also given her stability.  She does not want to go through pulmonary lung biopsy given the risks  She generally tries to avoid  medications.  She is happy going to pulmonary rehabilitation.  We discussed inhaled treprostinil  as approved therapy for pulmonary hypertension -expressed to her the overall safety profile for over 20 years and approved recently for pulmonary hypertension and WHO group 3.  Explained the minimal side effect risk but she does not want to go through this  We discussed the option of participating in a clinical trial.  We discussed what is called the PULSE inhaled nitric oxide  device study.  This is a 28-week plus study.  It was a device and a nitric oxide  supply study.  She is actually interested in this.  She is participating clinical trials.  She understands the voluntary nature of this.  She understands the risks that come with clinical trials.  She understands the control of risk through close follow-up and interventions..  However her 6-minute walk test is quite adequate and also she starting pulmonary rehabilitation so she would not be able to participate in this trial for several months.  In addition she is aware that inhaled treprostinil  which is standard of care therapy is currently on exclusion in this protocol current amendment.  She understands if she qualifies and chooses this trial that this would be a limitation.  We have given her the consent for review but at this time she does not qualify for this trial.  HRCT July 2021   IMPRESSION: 1. Spectrum of findings suggestive of a mild basilar predominant fibrotic interstitial lung disease without appreciable interval progression since baseline 05/12/2017 high-resolution chest CT. Favor NSIP, with UIP not excluded. Findings are indeterminate for UIP per consensus guidelines: Diagnosis of Idiopathic Pulmonary Fibrosis: An Official ATS/ERS/JRS/ALAT Clinical Practice Guideline. Am JINNY Honey Crit Care Med Vol 198, Iss 5, [phone], Aug 26 2017. 2. Dilated main pulmonary artery, stable, suggesting chronic pulmonary arterial hypertension. 3. Aortic  Atherosclerosis (ICD10-I70.0).     Electronically Signed   By: Selinda DELENA Blue M.D.   On: 07/10/2020 15:50     OV 05/31/2021  Subjective:  Patient ID: Patty Jenkins [name], female , DOB: June 17, 1951 , age 45 y.o. , MRN: [id number] , ADDRESS: [address] [place] [address] PCP Jodie Lavern CROME, MD Patient Care Team: Jodie Lavern CROME, MD as PCP - General (Family Medicine) Pyrtle, Gordy HERO, MD as Consulting Physician (Gastroenterology) Shamleffer, Donell Cardinal, MD as Consulting Physician (Endocrinology) Geronimo Amel, MD as Consulting Physician (Pulmonary Disease)  This Provider for this visit: Treatment Team:  Attending Provider: Geronimo Amel, MD    Follow-up dyspnea that is multifactorial due to obesity, physical deconditioning and diastolic dysfunction, ILD and new dx Group 3 PAH - dec 2021  45 pppd prior smoking hix  Follow-up  mild interstitial lung disease not otherwise specifieds -with stability 2018 through 2020 [non-IPF pattern/indeterminate   -on observation  -last CT Mach 2020 - > July 2021 without progression  Inconsisent dxx of  pulmonary emphysema present on high-resolution CT chest - alpha 1 MM  - o2 with exertion  - advised spiriva  oct 2020 but too expensive  Reported in only 1 CT. No mention in July 2021 CT  WHO group 3 Pulm htn with elevated PCWD - [facility] 12/07/20:   RA 13/12 mean  9 mmHg RV 48/8, EDP 12 PA 50/29 mean 38 PCWP 22/20 mean 20   O2 saturations: PA 62, Ao 97, SVC 64   CO 4.26 L/m, CI 1.96 Trans-pulmonic gradient 18 mmHg PVR 4.2 Woods Units  Grade 1 Disast dysfhn  - reportred July 2021 echo  Obesity   05/31/2021 -   Chief Complaint  Patient presents with   Follow-up    Pt states she has been doing okay since last visit. States she has begun pulmonary rehab which has helped.     HPI Kirston Luty 73 y.o. -returns for follow-up.  She continues on supportive care with oxygen .  Room air at rest 3-5 L pulsed with  oxygen .  For the last few months she is on pulsed oxygen .  She uses 2 L nasal cannula at night with her CPAP.  She is completed pulmonary rehabilitation.  For the last few months she is using pulsed oxygen .  She feels this works better.  She is [facility] [employer] [facility].  She does elliptical or treadmill.  She feels her symptoms have improved.  She is continue to gain some weight though.  She again reported that she feels sensitive to medication and wants avoid antifibrotic's.  She was interested this time and discussing treprostinil  versus pulsed research protocol for patients with interstitial lung disease and hypoxemic respiratory failure.  She has pulmonary hypertension based on 2021 December right heart catheterization.  We discussed the long-term safety profile of inhaled treprostinil .  Discussed the side effect profile.  After reflection about the fact the increase study showed improvement in 6-minute walk test, subgroup analysis potential modification of ILD and other composite outcome improvement.     IMPRESSION: 1. Spectrum of findings suggestive of a mild basilar predominant fibrotic interstitial lung disease without appreciable interval progression since baseline 05/12/2017 high-resolution chest CT. Favor NSIP, with UIP not excluded. Findings are indeterminate for UIP per consensus guidelines: Diagnosis of Idiopathic Pulmonary Fibrosis: An Official ATS/ERS/JRS/ALAT Clinical Practice Guideline. Am JINNY Honey Crit Care Med Vol 198, Iss 5, [phone], Aug 26 2017. 2. Dilated main pulmonary artery, stable, suggesting chronic pulmonary arterial hypertension. 3. Aortic Atherosclerosis (ICD10-I70.0).     Electronically Signed   By: Selinda DELENA Blue M.D.   On: 07/10/2020 15:50   OV 09/07/2021  Subjective:  Patient ID: Patty Jenkins [name], female , DOB: December 17, 1951 , age 40 y.o. , MRN: [id number] , ADDRESS: [address] [place] [address] PCP Jodie Lavern CROME, MD Patient Care  Team: Jodie Lavern CROME, MD as PCP - General (Family Medicine) Albertus, Gordy HERO, MD as Consulting Physician (Gastroenterology) [facility], Donell Cardinal, MD as Consulting Physician (Endocrinology) Geronimo Amel, MD as Consulting Physician (Pulmonary Disease)  This Provider for this visit: Treatment Team:  Attending Provider: Geronimo Amel, MD  Chronic hypoxemic respiratory failure due to all of the below ILD (interstitial lung disease) [facility])  -  Decline PF March 2020 through February 2022  but stable October 2020 through February 2022 on pulmonary function test -Stable on CT scan of the chest 2018 through July 2021 -Non-IPF likely - such as NSIP -Overall supportive care of plan without antifibrotic's given history of colitis [antifibrotic's can exacerbate this situation] and prior stability -You are using room air at rest with 3-5 L with exertion -pulse oxygen  for the last few months    WHO group 3 pulmonary arterial hypertension (HCC) with elevaed PCWP to 20 on Dec 2021 - started tyvaso  July 2022  WHO group 3 pulmonary arterial hypertension (  HCC) with elevaed PCWP to 20 on Dec 2021 - -Continue CPAP at ngith with 2L [place] at night  09/07/2021 -   Chief Complaint  Patient presents with   Follow-up    PFT performed today.  States that she still becomes SOB with exertion. Pt is now on Tyvaso  and states she finds herself needing her O2 more.     HPI Jakyiah Briones 73 y.o. -returns for follow-up.  She started treprostinil  in July 2022.  She says initially started doing better.  But once she got to 7 puffs 4 times daily she started getting more short of breath.  She felt overall that she is able to take a deep breath and feel less fatigued but when she got to that dose she said 1 time she walked across the room without oxygen  which she normally walks without oxygen  and she felt dizzy and winded.  Since then the symptoms have persisted.  She is also gained significant amount of  weight.  She is gained 16 pounds.  Her pulmonary function test is worse with a decline in both FVC and DLCO.  She says she is now needing 6 L of nasal cannula oxygen  when she exercises.  Previously 5 L nasal cannula.  Symptom score wise and walking desaturation test wise she seems the same.  She does not want to do antifibrotic's because of prior history of colectomy.  At this point she is not sure if the treprostinil  is helping her or hurting her or not doing anything.  OV 10/19/2021  Subjective:  Patient ID: Patty Jenkins [name], female , DOB: [birth year] , age 78 y.o. , MRN: [id number] , ADDRESS: [address] 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 - diagnosed Sept 2022 -Non-IPF likely - such as NSIP -  Decline PF March 2020 through February 2022  but stable October 2020 through February 2022  -> declined sept 2022 PFT -Stable on CT scan of the chest 2018 through July 2021 -You are using room air at rest with 3-5 L with exertion -pulse oxygen  for the last few months - started esbriet      WHO group 3 pulmonary arterial hypertension (HCC) with elevaed PCWP to 20 on Dec 2021 - started tyvaso  July 2022 -> stopped sept 2022 of concern PFT declined could ave been due to tyvaso  -> but restarted later in sept 2022   WHO group 3 pulmonary arterial hypertension (HCC) with elevaed PCWP to 20 on Dec 2021 - -Continue CPAP at ngith with 2L [place] at night   10/19/2021 -  followup  ILD   Type of visit: Telephone/Video Circumstance: COVID-19 national emergency Identification of patient Quintana Canelo with 1951-11-09 and MRN [id number] - 2 person identifier Risks: Risks, benefits, limitations of telephone visit explained. Patient understood and verbalized agreement to proceed Anyone else on call: just patient Patient location: [phone] - her cell.  At her home This provider location: [address] [place]   HPI Jakyria Bleau 73 y.o. - esbriet  still pending. BAck on tyvaso  since mid-late sept 2022.  Now doing it 4x/day - 5x/each time.   Also reports sewage leak since April/May 2022 Occassional odor. Some leakage through cleanup pipe. FEels this has made her respiration worse instead of tyvaso . Initialy she thought was tyvaso  but later realized might have been due to sweage leak and finally fixed in sept 2022.She feels after long term stability things got worse because of sweage leak. She feels since leak fixed (Same time went back on tyvaso ) she is better. In gym mets were down to 2.1 but now upto 2.4 mets (baseline 2.7 mets). NExt visit with mid dec 2022.   She wants to know if progression is perrmanent  Esbriet  sill in process. Arriving in 2 days. She wil start at 1 pill tid x 1 week -> 2 pill tid and maintain  She can do visit 11/04/21 - 3.00pm - with [name] - video to assess followup on esbriet     OV 11/11/21 - video APP  73 yo female former smoker followed for ILD and Emphysema  Medical history significant for Crohn's Disease and DCHF   Today's video visit is a 1 month follow up for ILD . She has recently started on Esbriet  2 weeks ago, does notice some symptoms of fatigue , joint aches and fatigue. Started on Esbriet  267 mg 2 tab Twice daily, this week. No v/d. Some nausea . No bloody stools. Some intermittent pain under right ribs on/off. No urinary issues. No syncope.  Remains on Tyvaso  Four times a day   Remains on Oxygen  6l/m with activity and  2l/m At bedtime  . None at rest  No change in O2 requirements   OV 12/07/2021  Subjective:  Patient ID: Patty Jenkins [name], female , DOB: 05-03-1951 , age 70 y.o. , MRN: [id number] , ADDRESS: [address] [place] [address] PCP Jodie Lavern CROME, MD Patient Care Team: Jodie Lavern CROME, MD as PCP - General (Family Medicine) Delford Maude BROCKS, MD as PCP - Cardiology (Cardiology) Pyrtle, Gordy HERO, MD as Consulting Physician (Gastroenterology) [facility], Donell Cardinal, MD as Consulting Physician (Endocrinology) Geronimo Amel, MD as Consulting Physician (Pulmonary Disease)  This Provider for this visit: Treatment Team:  Attending Provider: Geronimo Amel, MD    12/07/2021 -   Chief Complaint  Patient presents with   Follow-up    PFT performed 12/12. Pt states she began Esbriet  November 2022 and said that she has had different side effects from the med.    HPI Pamla Pangle 73 y.o. -at last visit we started pirfenidone  but as soon as she went up to 2 pills 3 times daily she started having significant side effects including drop in urine output therefore she stopped.  Most of the side effects are GI.  Present even at 1 pill 3 times daily.  She says stopped pirfenidone  yesterday and ever since then beginning to feel better especially today.  Continues on oxygen  at the same level.  Is on inhaled treprostinil  nebulizer.  Currently increased it to 6 times at 4 times a day.  Slowly going to work her self up.  She held off on escalating this while she was on pirfenidone  start.  We looked at her file-per registry but need clarification from radiology about whether she has traction bronchiectasis.  In addition need to know the current status of her polycythemia.  Last CT scan of the chest was in 2021.  We discussed about the fact that her ILD is progressing.  She is lives alone.  No siblings in the  city parents are deceased.  No children.  She has neighbors.  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  She says she regularly updates him.  We did not discuss CODE STATUS.  Offered home palliative care and she is willing.  She knows that she needs to get medical alert.       OV 02/01/2022  Subjective:  Patient ID: Patty Jenkins [name], female , DOB: March 06, 1951 , age 21 y.o. , MRN: [id number] , ADDRESS: [address] [place] [address] PCP Jodie Lavern CROME, MD Patient Care Team: Jodie Lavern CROME, MD as PCP - General (Family Medicine) Delford Maude BROCKS, MD as PCP - Cardiology (Cardiology) Pyrtle, Gordy HERO, MD as Consulting Physician (Gastroenterology) [facility], Donell Cardinal, MD as Consulting Physician (Endocrinology) Geronimo Amel, MD as Consulting Physician (Pulmonary Disease)  This Provider for this visit: Treatment Team:  Attending Provider: Geronimo Amel, MD    02/01/2022 -   Chief Complaint  Patient presents with   Follow-up    Pt states she is about the same since last visit.    HPI Kiri Hinderliter 73 y.o. -returns for follow-up.  She tells me the inhaler presently is working really well for her.  She feels that given the Crohn's disease is better.  Her exercise tolerance on the treadmill is better.  Is increased to 2.5 METS.  Her shortness of breath score is also improved.  There is normal sewage exposure in the house.  She feels her lungs are improving.  She had a high-resolution CT scan of the chest.  Dr. Jeremy the radiologist says there is ILA and not ILD.  She [as well as myself] no perplexed by this.  We looked at the CT scans ourselves.  In my view this may be early ILD.  It might be slightly worse in 2018.  Looking at lung function test status greater than 15% total decline in 3 years between 2019 and 2022.  However we do agree that the interstitial findings on the CT scan are mild.  Also she does not feel that she has significant emphysema.  She feels the CT report is over waiting the emphysema.  She says even  though she smoked it was not a true pack per day.  She says the Spiriva  did not help her.  Therefore she is a little bit perplexed with the emphysema diagnosis on the CT scan.  She and I agreed that we will discuss in a case conference.  I did submit her name for this.  At this point in time she is not on antifibrotic's and prefers not to be on it.  She is content being on treprostinil .   arterial hypertension. Electronically Signed   By: Newell Jeremy M.D.   On: 01/31/2022 11:53         OV 08/19/2022  Subjective:  Patient ID: Patty Jenkins [name], female , DOB: 02/12/1951 , age 14 y.o. , MRN: [id number] , ADDRESS: [address] [place] [address] PCP Jodie Lavern CROME, MD Patient Care Team: Jodie Lavern CROME, MD as PCP - General (Family Medicine) Delford Maude BROCKS, MD as PCP - Cardiology (Cardiology) Pyrtle, Gordy HERO, MD as Consulting Physician (Gastroenterology) [facility], Donell Cardinal, MD as Consulting Physician (Endocrinology) Geronimo Amel, MD as Consulting Physician (Pulmonary Disease)  This Provider for this visit: Treatment Team:  Attending Provider: Geronimo Amel, MD  Type of visit: Video Circumstance: patietn preferene Identification of patient Tywanna Seifer with 07/06/1951  and MRN [id number] - 2 person identifier Risks: Risks, benefits, limitations of telephone visit explained. Patient understood and verbalized agreement to proceed Anyone else on call: jsut patient Patient location: her home This provider location: [address], [address]; [place]; [place] [address]. [facility]. [phone]   08/19/2022 -  Followup above via video    HPI Mirra Basilio 73 y.o. -overall stable but maybe some more tired than usual. Goes to gym and works out treadmill (!5 min), elliptical 2-3h/week. Walks mostly in stores where she does better. Trying to lose weight- have not lose weight . She has not tried medications for weight loss yet but  worried in the settling of Crohns she is worried about side effect. STill on Tyvaso  - 9 puff 4 times per day. Did  echo yesterday - gr1 ddx and very small pericardia effusion  per rreport. Had repeat CT April 79776 -> they are saying no ILD again. But she is on tyvaso  and helping her dyspnea. She feels tyvaso  since 2022 and church controlling sewage overflows in [place] 2022 and crohns being in remission has helped   RE sewage exposure  -is the nearby church behind her house and there ws intermittent exposure. She feels sewage exposure made her worse. She has an attorney  Using o2 6L [place] with exertion. At rest  Today room air at rest - 96% and HR 77%   CT chest HRCT- Apirl 2023  IMPRESSION: 1. Mild, bland appearing bandlike scarring of the bilateral lung bases, unchanged compared to recent prior examination. No evidence of fibrotic interstitial lung disease. 2. Coronary artery disease. 3. Enlargement of the main pulmonary artery, as can be seen in pulmonary hypertension. 4. Coarse, nodular contour of the partially imaged liver in the upper abdomen, suggesting cirrhosis.   Aortic Atherosclerosis (ICD10-I70.0).     Electronically Signed   By: Marolyn JONETTA Jaksch M.D.   On: 03/31/2022 10:16     OV 09/22/2022  Subjective:  Patient ID: Patty Jenkins [name], female , DOB: [birth year] , age 20 y.o. , MRN: [id number] , ADDRESS: [address] [place] [address] PCP Jodie Lavern CROME, MD Patient Care Team: Jodie Lavern CROME, MD as PCP - General (Family Medicine) Delford Maude BROCKS, MD as PCP - Cardiology (Cardiology) Pyrtle, Gordy HERO, MD as Consulting Physician (Gastroenterology) [facility], Donell Cardinal, MD as Consulting Physician (Endocrinology) Geronimo Amel, MD as Consulting Physician (Pulmonary Disease)  This Provider for this visit: Treatment Team:  Attending Provider: Geronimo Amel, MD   09/22/2022 -   Chief Complaint  Patient presents with   Follow-up    PFT performed  today.  Pt states she has had good and bad days with her breathing especially when the weather changes it becomes affected.     HPI Delbert Vu 73 y.o. -returns for follow-up.  Overall she feels she is doing stable but her lung function shows continued decline.  She says since last year there is no further sewage exposure in the church behind her house.  She says the way they repaired it she is worried that there may be future risk of exposure.  It is great life charge on [employer].  The pastor that is Redell Gaskins.  She is upset about the situation.  She is worried that the significant sewage exposure last year has continued to contribute to her lung damage.  There is a concept called hit-and-run hypothesis.  Currently she is not smoking.  But pulmonary function test  shows continued decline alpha-1 antitrypsin is normal.  She feels the Tyvaso  is helping her.  She is up-to-date with the COVID and flu shot this season.  Discussed RSV vaccine and she will have that.  She continues CPAP for sleep apnea.  Of note previous CT scan has reported emphysema.  She states.  I did not help her in the past but she is willing to try sample Trelegy.    OV 12/29/2022  Subjective:  Patient ID: Patty Jenkins [name], female , DOB: 16-Dec-1951 , age 58 y.o. , MRN: [id number] , ADDRESS: [address] [place] [address] PCP Jodie Lavern CROME, MD Patient Care Team: Jodie Lavern CROME, MD as PCP - General (Family Medicine) Delford Maude BROCKS, MD as PCP - Cardiology (Cardiology) Pyrtle, Gordy HERO, MD as Consulting Physician (Gastroenterology) [facility], Donell Cardinal, MD as Consulting Physician (Endocrinology) Geronimo Amel, MD as Consulting Physician (Pulmonary Disease)  This Provider for this visit: Treatment Team:  Attending Provider: Geronimo Amel, MD    12/29/2022 -   Chief Complaint  Patient presents with   Follow-up    Pulled muscles behind right knee area.  Unable to very active during  August, Sept, Oct and first part of November 2023.  Oxygen  drops with exertion while using  6 lpm pulsed oxygen .  Trelegy did not improve breathing, took both samples and stopped.  HPI Regan Mcbryar 73 y.o. -returns for follow-up.  Since her last visit there is no further sewage exposure although the sewage drain itself is not fixed.  She has had difficulty getting an attorney.  She did get pulmonary function test recently and shows continued stability in both FVC and DLCO since September 2022 which was the last time.  Of sewage exposure.  It is definitely progressive sieve since February 2022 and 2021.  Symptoms course recently is stable.  She says she is improved her effort tolerance and is able to do 2.7 METS exercise.  She says recently she overdid her exercise and in the summer 2023 around August she pulled her right hamstring.  After that she was given some muscle relaxant that made her confused and had side effects.  Around the same time she was also on Trelegy and she not sure if some of the side effects were from Trelegy.  She did get tachycardic with Trelegy.  Therefore she does not want to do this anymore.  Last month when she had pulmonary function test albuterol  seem to help her so she just wants albuterol  for rescue and she did not want any long-acting beta agonist.  Previously we tried Spiriva  and she does not want to try this again.  She is up-to-date with her vaccines.  Overall she is feeling stable using 2 L at rest and 6 L with exertion.  Symptom score also shows stability.     OV 04/06/2023  Subjective:  Patient ID: Patty Jenkins [name], female , DOB: 05-Oct-1951 , age 85 y.o. , MRN: [id number] , ADDRESS: [address] [place] [address] PCP Jodie Lavern CROME, MD Patient Care Team: Jodie Lavern CROME, MD as PCP - General (Family Medicine) Delford Maude BROCKS, MD as PCP - Cardiology (Cardiology) Pyrtle, Gordy HERO, MD as Consulting Physician (Gastroenterology) [facility], Donell Cardinal, MD as Consulting Physician (Endocrinology) Geronimo Amel, MD as Consulting Physician (Pulmonary Disease)  This Provider for this visit: Treatment Team:  Attending Provider: Geronimo Amel, MD  04/06/2023 -   Chief Complaint  Patient presents with   Follow-up  Pressure sores ??      HPI Vedanshi Massaro 73 y.o. -returns for her WHO group 3 pulm hypertension associated with emphysema [previous CT reading has ruled out ILD].  It was previously determined that she does not have ILD.  She is tolerating her Tyvaso  fine.  She is feeling stable.  Walking desaturation test was stable.  She reports easy tachycardia.  She has seen Dr. Nishan  for this and she has been reassured.  We discussed doing beta-blockers but she is not interested.  She notes tachycardia when she is on the treadmill with her oxygen  on.  The heart rate goes around to 130-135.  Her age-predicted max is around 120/min.  She is not necessarily symptomatic.  I did indicate to her that as long as it is 15 130-135 and she is asymptomatic she can continue to monitor.  Discussed physiology of heart rate response.  She continues a CPAP at night with oxygen .  She continues albuterol  as needed.  Her last CT scan of the chest was in April 2023.  She is a former 45 pack smoker  Of note she still has not had any further sewage exposure from the neighbor since 2022.  Her last pulmonary function test December 2023 showed continued stability although it is worse compared to 2021 and early 2022 [preexposure].  She is still considering her legal options.        OV 10/25/2023  Subjective:  Patient ID: Patty Jenkins [name], female , DOB: [birth year] , age 22 y.o. , MRN: [id number] , ADDRESS: [address] [place] [address] PCP Jodie Lavern CROME, MD Patient Care Team: Jodie Lavern CROME, MD as PCP - General (Family Medicine) Delford Maude BROCKS, MD as PCP - Cardiology (Cardiology) Pyrtle, Gordy HERO, MD as Consulting Physician  (Gastroenterology) [facility], Donell Cardinal, MD as Consulting Physician (Endocrinology) Geronimo Amel, MD as Consulting Physician (Pulmonary Disease)  This Provider for this visit: Treatment Team:  Attending Provider: Geronimo Amel, MD    10/25/2023 -   Chief Complaint  Patient presents with   Follow-up      HPI Mylea Roarty 73 y.o. -returns for follow-up.  She was supposed have pulmonary function test today but they scheduled with the [facility] location with the visit in the [facility] location.  She felt she did not have sufficient time to turn around and get to the clinic visit on time.  She also told me that the appointment was scheduled for 4 PM although in my computer short of his 3:30 PM.  In any event she feels she is up and down stable symptoms with the weather pulmonary wise overall she is stable.  She did have a high-resolution CT chest.  There is no pneumonia or cancer.  However there is mild UIP that is according to radiology progressive over time.  She has several other issues that she brought up with me - She is in litigation with her neighbor the church property which seem to sewage water that made her lung function decline. - Her cat died in 03-02-24from neurotoxicity which she believes is from the sewage water - Recently she been having leg cramps and she saw physical therapy which then made pain in her right lower extremity go up.  Apparently she has had neuro eval and is also concerned there is neurotoxicity.  Or neuropathy.  She believes all this might be related to the switch and the cat - She has been diagnosed with left eye  wet macular degeneration and she tried to have a stent but had tolerance problems.   On walking desaturation test today she desaturated quarter lap.;  This appears to be slowly progressive. She is tolerating inhaled treprostinil   CT Chest data from date: HRCT 10/10/23 as below Narrative & Impression  CLINICAL  DATA:  73 year old female with history of chronic respiratory hypoxia. Former smoker.   EXAM: CT CHEST WITHOUT CONTRAST   TECHNIQUE: Multidetector CT imaging of the chest was performed following the standard protocol without intravenous contrast. High resolution imaging of the lungs, as well as inspiratory and expiratory imaging, was performed.   RADIATION DOSE REDUCTION: This exam was performed according to the departmental dose-optimization program which includes automated exposure control, adjustment of the mA and/or kV according to patient size and/or use of iterative reconstruction technique.   COMPARISON:  High-resolution chest CT 03/30/2022.   FINDINGS: Cardiovascular: Heart size is normal. There is no significant pericardial fluid, thickening or pericardial calcification. There is aortic atherosclerosis, as well as atherosclerosis of the great vessels of the mediastinum and the coronary arteries, including calcified atherosclerotic plaque in the left anterior descending, left circumflex and right coronary arteries.   Mediastinum/Nodes: No pathologically enlarged mediastinal or hilar lymph nodes. Please note that accurate exclusion of hilar adenopathy is limited on noncontrast CT scans. Esophagus is unremarkable in appearance.   Lungs/Pleura: High-resolution images demonstrates some patchy peripheral predominant areas of ground-glass attenuation, scattered areas of mild septal thickening, occasional subpleural reticulation, as well as some scattered areas of mild cylindrical bronchiectasis and peripheral bronchiolectasis. These findings have a mild craniocaudal gradient and appear slightly progressive compared to prior studies. Inspiratory and expiratory imaging is unremarkable. No acute consolidative airspace disease. No pleural effusions. No definite suspicious appearing pulmonary nodules or masses are noted.   Upper Abdomen: Nodular contour of the liver suggesting  underlying cirrhosis.   Musculoskeletal: There are no aggressive appearing lytic or blastic lesions noted in the visualized portions of the skeleton.   IMPRESSION: 1. Subtle fibrotic changes in the lungs which appear minimally progressive compared to the prior study with an overall spectrum of findings considered probable usual interstitial pneumonia (UIP) per current ATS guidelines. At this time, the severity of the disease appears very mild, however, repeat high-resolution chest CT is suggested in 12 months to assess for temporal changes in the appearance of the lung parenchyma. 2. Aortic atherosclerosis, in addition to three-vessel coronary artery disease. Please note that although the presence of coronary artery calcium documents the presence of coronary artery disease, the severity of this disease and any potential stenosis cannot be assessed on this non-gated CT examination. Assessment for potential risk factor modification, dietary therapy or pharmacologic therapy may be warranted, if clinically indicated. 3. Morphologic changes in the liver suggesting cirrhosis.   Aortic Atherosclerosis (ICD10-I70.0).     Electronically Signed   By: Toribio Aye M.D.   On: 10/23/2023 18:25     OV 05/07/2024  Subjective:  Patient ID: Patty Jenkins [name], female , DOB: Oct 01, 1951 , age 20 y.o. , MRN: [id number] , ADDRESS: [address] [place] [address] PCP Jodie Lavern CROME, MD Patient Care Team: Jodie Lavern CROME, MD as PCP - General (Family Medicine) Delford Maude BROCKS, MD as PCP - Cardiology (Cardiology) Pyrtle, Gordy HERO, MD as Consulting Physician (Gastroenterology) [facility], Donell Cardinal, MD as Consulting Physician (Endocrinology) Geronimo Amel, MD as Consulting Physician (Pulmonary Disease)  This Provider for this visit: Treatment Team:  Attending Provider: Geronimo Amel, MD  05/07/2024 -   Chief Complaint  Patient presents with   Follow-up    PFT done  04/30/24. She has good days and bad days with breathing. She has some PND, throat clearing.      HPI Hartley Wyke 73 y.o. -returns for routine 64-month follow-up.  She continues to do stable.  She says she is dealing with some muscle cramps that are worse with exercise and because of that she is not exercising more.  She is somewhat worried.  She is wondering if it is because of time also.  She brought a product insert with her that shows the incidence of this is less than 1% but she tried to reduce the Tyvaso  to 6 times breaths 4 times daily and with this the cramp improved.  I did advise her to either give a holiday or reduce it to 3 breaths 4 times daily and then observe.  She is going to do that.  In the interim with her macular degeneration she found another eye doctor is not taking injections for the last 5 months.  This has made no difference to her cramps.  Her exercise hypoxemia test showed continued desaturation relieved by rest.  Of note she is in litigation with her neighbor who is the charge and the hearing is on July 29, 2024.     OV 11/12/2024  Subjective:  Patient ID: Patty Jenkins [name], female , DOB: 18-Feb-1951 , age 89 y.o. , MRN: [id number] , ADDRESS: [address] [place] [id number] PCP Jodie Lavern CROME, MD Patient Care Team: Jodie Lavern CROME, MD as PCP - General (Family Medicine) Delford Maude BROCKS, MD as PCP - Cardiology (Cardiology) Pyrtle, Gordy HERO, MD as Consulting Physician (Gastroenterology) [facility], Donell Cardinal, MD as Consulting Physician (Endocrinology) Geronimo Amel, MD as Consulting Physician (Pulmonary Disease)  This Provider for this visit: Treatment Team:  Attending Provider: Geronimo Amel, MD     Chronic hypoxemic respiratory failure due to all of the below ILD (interstitial lung disease) (HCC) - 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 - diagnosed Sept 2022 (in Feb a2023 and April 2023 they are saying no ILD and she feels is because  crohns is under remission, tyvaso  definitely since sep 2022, and resolution nearbyhome sewage exposure may 2022 - sept 2022) -Non-IPF likely - such as NSIP -  Decline PF March 2020 through February 2022  but stable October 2020 through February 2022  -> declined sept 2022 PFT -Stable on CT scan of the chest 2018 through July 2021  -You are using room air at rest with 3-5 L with exertion -pulse oxygen  for the last few months - started esbriet   oct/nov 2022 - stopped GI intolerance 11/29/21    WHO group 3 pulmonary arterial hypertension (HCC) with elevaed PCWP to 20 on Dec 2021 - started tyvaso  July 2022 -> stopped sept 2022 of concern PFT declined could ave been due to tyvaso  -> but restarted later in sept 2022   STOPPED SUMMER 2025  WHO group 3 pulmonary arterial hypertension (HCC) with elevaed PCWP to 20 on Dec 2021 - -Continue CPAP at ngith with 2L [place] at   Obestuity - Morbid Crohns Polycythemua Former 45 pack smoker   11/12/2024 -   Chief Complaint  Patient presents with   Interstitial Lung Disease    Pt states since LOV breathing has been better SOB w/ any activity depends  (8L of 02 w/ exertion) (6L of 02 when walking around the house) (3L of 02 when sleeping)  When walking to exam room pt was on 6L of 02       HPI Oprah Camarena 73 y.o. -usan Jhanvi Drakeford is a 73 year old female with emphsyems/some  ILDpulmonary hypertension who presents for follow-up regarding her oxygen  therapy and pulmonary health. She reports that the increased oxygen , which was adjusted during her pulmonary rehabilitation, helped her tremendously. She currently does not require oxygen  at rest but uses up to eight liters with exertion. She discontinued Tyvazo approximately six months ago, prior to her last appointment, and reports feeling better since stopping the medication. She experiences shortness of breath primarily when lifting objects or performing activities overhead.  Her last CT scan was  in October of the previous year. She quit smoking in 2009, approximately 15 years ago, and has not had any hospitalizations or surgeries since then.  She has been exploring weight loss options due to increased sensitivity, possibly related to her Crohn's disease. She cannot tolerate metformin  or Victroza and is considering other options, including GLP-1 medications, to address her metabolism issues.  In terms of litigation with her neighbor [place].  She dropped a case against them but she is trying to enter into arbitration.  She might need my CV for that.  She will also have a flu shot today.  Noted that she is not taking Trelegy.  SYMPTOM SCALE -  03/12/2019  10/07/2019 30# wt gain  06/23/2020 250# 09/07/2021 266# - tyvaso  since July 2022 12/07/2021 272# 02/01/2022 tyvaso  slightly so much better 09/22/2022  12/29/2022  04/06/2023  11/12/2024   O2 use RA ra   2-6L 2-6L 2-6L 2-6L  2-6L  0 L rest, 8L exertion  Shortness of Breath 0 -> 5 scale with 5 being worst (score 6 If unable to do)   0 0       At rest 0 0 0 0 with o2 0 0 0 0 2 0  Simple tasks - showers, clothes change, eating, shaving 0 0  0 0 1 1 1.5 2 0.5  Household (dishes, doing bed, laundry) 0 0 0 0 4 1 4  1.5 3.5 1  Shopping 0 0 0 0 1 0 0 0 0 0   Walking at own pace 0 4.5 4.5 3 4 1  1.5 0 2 0  Walking up Stairs 1-2 5 5 3 5 3 6  4.5 5 4   Total (40 - 48) Dyspnea Score 2 9.5 9.5 6 al with o2 14 6 13.5 7.5 15.4 6  How bad is your cough? 0 0 0 0 0 At the time of onset of0 0 0 0 0  How bad is your fatigue 0 1 0 2 3 1 4 0 2 4   nausea   0 0 0 0 0 0 0 0  vomit   0 0 0 0 0 0 0 0  diarrhe   0 2 3 1  0 0 0 0  axniety   3 2 2 1 6 2  2.5 0  depression   0 1 1 0 0 0 0 0        Simple office walk 185 feet x  3 laps goal with forehead probe 03/12/2019  10/07/2019  06/23/2020  09/07/2021  04/06/2023  10/25/2023   O2 used Room air Room air Room air ra ra ra  Number laps completed 3 2nd lap 1 lap 1 lap 1/2 laps 1/4  Comments  about pace slow slow slow     Resting Pulse Ox/HR 100% and  85/min 96% and 72/min 97% and 77 97% and 76 90% and HR 93 91% and 112/min  Final Pulse Ox/HR 88% and 135/min 86% and 105/min 87% and 112/min 83 and 112/min 83% ad HR 111 86% in 1/4 llap and 111  Desaturated </= 88% yes       Desaturated <= 3% points Yes, 12       Got Tachycardic >/= 90/min yes       Symptoms at end of test Mild dyspnea  Corrected with 5L [place]. Even with 4L [place] downt to  87% Corrected with 6LNC at 94$ for antoher 2lpas    Miscellaneous comments x  ? worse          SIT STAND TEST - goal 15 times   05/07/2024    O2 used RA   PRobe - finter or forehead finger   Number sit and stand completed - goal 15 15   Time taken to complete 43 sec   Resting Pulse Ox/HR/Dyspnea  92% and 103/min and dyspnea of 1/10    Peak measures 88 % and 65/min and dyspnea of 7/10   Final Pulse Ox/HR 87% and 109/min and dyspnea of 9/10   Desaturated </= 88% yes   Desaturated <= 3% points yes   Got Tachycardic >/= 90/min yes   Miscellaneous comments x      PFT     Latest Ref Rng & Units 11/04/2024    1:40 PM 04/30/2024    3:18 PM 10/03/2023    2:23 PM 12/23/2022    1:51 PM 09/22/2022    2:27 PM 12/06/2021   12:57 PM 09/07/2021   12:54 PM  PFT Results  FVC-Pre L 2.18  P 2.29  2.40  2.28  2.16  2.32  2.28   FVC-Predicted Pre % 79  P 82  86  80  76  80  79   FVC-Post L    2.41      FVC-Predicted Post %    85      Pre FEV1/FVC % % 57  P 61  59  62  60  60  62   Post FEV1/FCV % %    62      FEV1-Pre L 1.24  P 1.41  1.42  1.41  1.30  1.38  1.41   FEV1-Predicted Pre % 60  P 67  68  65  61  63  65   FEV1-Post L    1.50      DLCO uncorrected ml/min/mmHg 10.11  P 10.99  12.75  12.06  10.92  11.82  11.94   DLCO UNC% % 54  P 59  68  64  57  62  63   DLCO corrected ml/min/mmHg   12.75  11.85  10.61  11.45  11.94   DLCO COR %Predicted %   68  62  56  60  63   DLVA Predicted % 60  P 63  73  67  66  66  70   TLC L    5.08      TLC %  Predicted %    102      RV % Predicted %    117        P Preliminary result       LAB RESULTS last 96 hours No results found.       has a past medical history of Abnormal nuclear stress test, Allergy (1970s), Anxiety, Aortic atherosclerosis, Arthritis, Blood transfusion  without reported diagnosis (1993), Cataract (2020), Chronic diastolic CHF (congestive heart failure) (HCC), Coronary artery calcification seen on CT scan, Crohn disease (HCC), Depression, DVT (deep venous thrombosis) (HCC), Dyspnea, Grade I diastolic dysfunction, Hyperlipidemia, ILD (interstitial lung disease) (HCC), OSA on CPAP, Oxygen  deficiency (2019), Pernicious anemia, Polycythemia vera (HCC), Pulmonary embolism (HCC) (2010), Pulmonary hypertension (HCC), Sleep apnea (2018), and Sleep disorder (01/10/2020).   reports that she quit smoking about 15 years ago. Her smoking use included cigarettes. She started smoking about 60 years ago. She has a 45 pack-year smoking history. She has never used smokeless tobacco.  Past Surgical History:  Procedure Laterality Date    RIGHT BREAST LUMPECTOMY WITH RADIOACTIVE SEED LOCALIZATION (Right Breast)  02/04/2021   BIOPSY  09/30/2019   Procedure: BIOPSY;  Surgeon: Albertus Gordy HERO, MD;  Location: [facility];  Service: Gastroenterology;;   BREAST LUMPECTOMY WITH RADIOACTIVE SEED LOCALIZATION Right 02/04/2021   Procedure: RIGHT BREAST LUMPECTOMY WITH RADIOACTIVE SEED LOCALIZATION;  Surgeon: Vernetta Berg, MD;  Location: [facility] OR;  Service: General;  Laterality: Right;   CARDIAC CATHETERIZATION     COLON RESECTION  1993   COLON SURGERY  1993   and Small Intestine   COLONOSCOPY WITH PROPOFOL  N/A 09/30/2019   Procedure: COLONOSCOPY WITH PROPOFOL ;  Surgeon: Albertus Gordy HERO, MD;  Location: [facility];  Service: Gastroenterology;  Laterality: N/A;   EYE SURGERY  2021   cataract right eye   HIP ARTHROPLASTY Right    x 2, initial right THA, then subsequent right THA revision    JOINT  REPLACEMENT  2010   right hip SEP and OCT repair   LEFT HEART CATH AND CORONARY ANGIOGRAPHY N/A 07/28/2017   Procedure: Left Heart Cath and Coronary Angiography;  Surgeon: Verlin Lonni BIRCH, MD;  Location: [facility] INVASIVE CV LAB;  Service: Cardiovascular;  Laterality: N/A;   POLYPECTOMY  09/30/2019   Procedure: POLYPECTOMY;  Surgeon: Albertus Gordy HERO, MD;  Location: [facility];  Service: Gastroenterology;;   RIGHT HEART CATH N/A 12/07/2020   Procedure: RIGHT HEART CATH;  Surgeon: Wonda Sharper, MD;  Location: [facility] INVASIVE CV LAB;  Service: Cardiovascular;  Laterality: N/A;   SMALL INTESTINE SURGERY  1993    Allergies  Allergen Reactions   Tizanidine Hcl Shortness Of Breath   Victoza  [Liraglutide ] Shortness Of Breath   Metformin  And Related Diarrhea    Increased moodiness   Onion Other (See Comments)    sereve diarrhea, stomach pains (Crohn's flares)   Avastin  [Bevacizumab ]    Lasix  [Furosemide ] Itching    Hypersensitivity - pt can take as needed    Pirfenidone  Other (See Comments)    GI side effects   Potassium-Containing Compounds Other (See Comments)    Due to chron's, difficult passing though kidneys not allowing absorption in the body    Prednisone Other (See Comments)    Altered mood   Sulfa Drugs Cross Reactors Other (See Comments)    feels like bugs crawling on me   Trelegy Ellipta  [Fluticasone-Umeclidin-Vilant] Palpitations    Immunization History  Administered Date(s) Administered   Fluad Quad(high Dose 65+) 10/07/2019, 09/09/2020, 11/27/2021, 09/19/2022   Fluad Trivalent(High Dose 65+) 09/25/2023, 09/19/2024   INFLUENZA, HIGH DOSE SEASONAL PF 08/28/2018   Influenza-Unspecified 11/23/2017   PFIZER Comirnaty(Gray Top)Covid-19 Tri-Sucrose Vaccine 09/19/2022   PFIZER(Purple Top)SARS-COV-2 Vaccination 02/16/2020, 03/17/2020, 12/14/2020, 05/14/2021, 10/27/2021   Pfizer(Comirnaty)Fall Seasonal Vaccine 12 years and older 09/21/2023   Pneumococcal Conjugate-13  02/13/2018   Pneumococcal Polysaccharide-23 03/12/2019   Respiratory Syncytial Virus Vaccine,Recomb Aduvanted(Arexvy) 09/24/2022  Family History  Problem Relation Age of Onset   Glaucoma Mother    High blood pressure Mother    High Cholesterol Mother    Sleep apnea Mother    Hearing loss Mother    Hypertension Mother    Varicose Veins Mother    Diabetes Mellitus II Father    Sleep apnea Father    Anxiety disorder Father    Diabetes Father    COPD Brother    Heart disease Brother    Alcohol abuse Brother    Hypertension Brother    Non-Hodgkin's lymphoma Brother    Diabetes Mellitus II Brother    Glaucoma Brother    Cancer Brother    Early death Brother    Obesity Maternal Grandmother    Hypertension Maternal Grandfather    Obesity Maternal Grandfather    Early death Paternal Grandfather    Alcohol abuse Paternal Uncle    Diabetes Brother    Hypertension Brother    Colon cancer Neg Hx    Esophageal cancer Neg Hx    Pancreatic cancer Neg Hx    Stomach cancer Neg Hx    Liver disease Neg Hx      Current Outpatient Medications:    albuterol  (VENTOLIN  HFA) 108 (90 Base) MCG/ACT inhaler, INHALE 2 PUFFS INTO THE LUNGS EVERY 6 HOURS AS NEEDED FOR WHEEZING OR SHORTNESS OF BREATH, Disp: 6.7 g, Rfl: 6   aspirin  EC 81 MG tablet, Take 81 mg by mouth daily., Disp: , Rfl:    Calcium Carbonate-Vit D-Min (CALCIUM 1200) 1200-1000 MG-UNIT CHEW, Chew 1 tablet by mouth daily., Disp: , Rfl:    CANNABIDIOL PO, Take 1 Dose by mouth 2 (two) times daily as needed (anxiety/sleep.). CBD Oil, Disp: , Rfl:    cetirizine -pseudoephedrine (ZYRTEC -D) 5-120 MG tablet, Take 1 tablet by mouth daily as needed (sinus headaches.)., Disp: , Rfl:    clonazePAM  (KLONOPIN ) 0.5 MG tablet, Take 1 tablet (0.5 mg total) by mouth daily as needed for anxiety., Disp: 90 tablet, Rfl: 1   cyanocobalamin  (VITAMIN B12) 1000 MCG/ML injection, Inject 1 mL (1,000 mcg total) into the muscle every 30 (thirty) days., Disp: 10  mL, Rfl: 1   dorzolamide (TRUSOPT) 2 % ophthalmic solution, Place 1 drop into the right eye 2 (two) times daily., Disp: , Rfl:    FLUoxetine  (PROZAC ) 20 MG capsule, Take 1 capsule (20 mg total) by mouth daily., Disp: 90 capsule, Rfl: 3   fluticasone (FLONASE) 50 MCG/ACT nasal spray, Place 1-2 sprays into both nostrils daily as needed for allergies or rhinitis., Disp: , Rfl:    furosemide  (LASIX ) 20 MG tablet, TAKE 1 TABLET BY MOUTH DAILY AS NEEDED FOR FLUID OR SWELLING., Disp: 30 tablet, Rfl: 11   gabapentin  (NEURONTIN ) 100 MG capsule, Take 3 capsules (300 mg total) by mouth at bedtime., Disp: 90 capsule, Rfl: 11   hydrocortisone  (PROCTOZONE -HC) 2.5 % rectal cream, Place 1 application rectally 2 (two) times daily as needed (Crohn's flare)., Disp: 28 g, Rfl: 2   ibuprofen (ADVIL) 200 MG tablet, Take 400-800 mg by mouth every 8 (eight) hours as needed (pain.)., Disp: , Rfl:    mesalamine (PENTASA) 250 MG CR capsule, Take 250-500 mg by mouth 4 (four) times daily as needed (crohn's disease flare ups)., Disp: , Rfl:    Multiple Vitamins-Minerals (ICAPS AREDS 2 PO), Take 1 tablet by mouth daily., Disp: , Rfl:    potassium chloride  (MICRO-K ) 10 MEQ CR capsule, Take 1 capsule (10 mEq total) by mouth daily as  needed (when taking furosemide )., Disp: 30 capsule, Rfl: 3   prednisoLONE acetate (PRED FORTE) 1 % ophthalmic suspension, Place 1 drop into both eyes 2 (two) times daily as needed (Uveitis)., Disp: , Rfl:    Probiotic Product (PROBIOTIC PO), Take 1 capsule by mouth 2 (two) times a week. As needed, Disp: , Rfl:    SYRINGE-NEEDLE, DISP, 3 ML (LUER LOCK SAFETY SYRINGES) 25G X 1 3 ML MISC, USE AS DIRECTED, Disp: 100 each, Rfl: 5   traMADol  (ULTRAM ) 50 MG tablet, Take 1 tablet (50 mg total) by mouth every 6 (six) hours as needed., Disp: 20 tablet, Rfl: 0   triamcinolone  cream (KENALOG ) 0.1 %, Apply 1 Application topically 2 (two) times daily. For 2 weeks to legs, then as needed, Disp: 80 g, Rfl: 1    amoxicillin  (AMOXIL ) 500 MG capsule, Take 4 capsules (2,000 mg total) by mouth once as needed for up to 1 dose. Before dental procedures (Patient not taking: Reported on 11/12/2024), Disp: 20 capsule, Rfl: 1      Objective:   Vitals:   11/12/24 1442  BP: 110/72  Pulse: 79  Temp: 97.9 F (36.6 C)  TempSrc: Oral  SpO2: 95%  Weight: 273 lb (123.8 kg)  Height: 5' 4 (1.626 m)    Estimated body mass index is 46.86 kg/m as calculated from the following:   Height as of this encounter: 5' 4 (1.626 m).   Weight as of this encounter: 273 lb (123.8 kg).  @WEIGHTCHANGE @  Filed Weights   11/12/24 1442  Weight: 273 lb (123.8 kg)     Physical Exam   General: No distress.  O2 at rest: yes Cane present: no Sitting in wheel chair: no Frail: no Obese: YES Neuro: Alert and Oriented x 3. GCS 15. Speech normal Psych: Pleasant Resp:  Barrel Chest - no.  Wheeze - no, Crackles - no, No overt respiratory distress CVS: Normal heart sounds. Murmurs - no Ext: Stigmata of Connective Tissue Disease - no HEENT: Normal upper airway. PEERL +. No post nasal drip        Assessment/     Assessment & Plan Pulmonary emphysema with fibrosis of lung (HCC)  ILD (interstitial lung disease) (HCC)  DOE (dyspnea on exertion)  Stopped smoking with greater than 40 pack year history  WHO group 3 pulmonary arterial hypertension (HCC)  Flu vaccine need    PLAN Patient Instructions  Chronic hypoxemic respiratory failure  ILD (interstitial lung disease) (HCC)  WHO group 3 pulmonary arterial hypertension (HCC) with elevaed PCWP to 20 on Dec 2021 Former heavy smoker  Clinically stable and off inhaled treprostinil   Plan - continue albuterol  MDI 2 puff as needed for emergency - c monitor off inhaled treprostinil  --Continue oxygen  0-8 L nasal cannula pulsed with exertion and at night 2 L nasal cannula and room air at rest ---Make attempts to lose weight - talk to your PCP Jodie Lavern CROME, MD  abut weight loss drugs - Get high-resolution CT chest sometime in the next few weeks   ````````````````````````````````````````````````````````````````````` OSA on CPAP  Stable  Plan  -Continue CPAP at ngith with 2L [place] at night  per the advice of his sleep doctor   Associated emphysema on CT -feb 2023 Normal Alpha 1 AT  stable  Plan  -prescribe albluterol 2 puff as needed - per shared decisio making just above is the treatment   Flu Vaccine  Plan  - High dose flu shot 11/12/2024    Follow-up  -6  months  s face to face with Dr Geronimo; 15 min visit  = symptom score and walk test at followupo   -    FOLLOWUP    No follow-ups on file.    SIGNATURE    Dr. Dorethia Geronimo, M.D., F.C.C.P,  Pulmonary and Critical Care Medicine Staff Physician, [facility] Director - Interstitial Lung Disease  Program  Pulmonary Fibrosis [facility] at [facility] [place], [place], [address]  Pager: [phone], If no answer or between  15:00h - 7:00h: call [phone] Telephone: [phone]  3:39 PM 11/12/2024   Moderate Complexity MDM OFFICE  2021 E/M guidelines, first released in 2021, with minor revisions added in 2023 and 2024 Must meet the requirements for 2 out of 3 dimensions to qualify.    Number and complexity of problems addressed Amount and/or complexity of data reviewed Risk of complications and/or morbidity  One or more chronic illness with mild exacerbation, OR progression, OR  side effects of treatment  Two or more stable chronic illnesses  One undiagnosed new problem with uncertain prognosis  One acute illness with systemic symptoms   One Acute complicated injury Must meet the requirements for 1 of 3 of the categories)  Category 1: Tests and documents, historian  Any combination of 3 of the following:  Assessment requiring an independent historian  Review of prior external note(s) from each unique  source  Review of results of each unique test  Ordering of each unique test    Category 2: Interpretation of tests   Independent interpretation of a test performed by another physician/other qualified health care professional (not separately reported)  Category 3: Discuss management/tests  Discussion of management or test interpretation with external physician/other qualified health care professional/appropriate source (not separately reported) Moderate risk of morbidity from additional diagnostic testing or treatment Examples only:  Prescription drug management  Decision regarding minor surgery with identfied patient or procedure risk factors  Decision regarding elective major surgery without identified patient or procedure risk factors  Diagnosis or treatment significantly limited by social determinants of health             HIGh Complexity  OFFICE   2021 E/M guidelines, first released in 2021, with minor revisions added in 2023. Must meet the requirements for 2 out of 3 dimensions to qualify.    Number and complexity of problems addressed Amount and/or complexity of data reviewed Risk of complications and/or morbidity  Severe exacerbation of chronic illness  Acute or chronic illnesses that may pose a threat to life or bodily function, e.g., multiple trauma, acute MI, pulmonary embolus, severe respiratory distress, progressive rheumatoid arthritis, psychiatric illness with potential threat to self or others, peritonitis, acute renal failure, abrupt change in neurological status Must meet the requirements for 2 of 3 of the categories)  Category 1: Tests and documents, historian  Any combination of 3 of the following:  Assessment requiring an independent historian  Review of prior external note(s) from each unique source  Review of results of each unique test  Ordering of each unique test    Category 2: Interpretation of tests    Independent interpretation  of a test performed by another physician/other qualified health care professional (not separately reported)  Category 3: Discuss management/tests  Discussion of management or test interpretation with external physician/other qualified health care professional/appropriate source (not separately reported)  HIGH risk of morbidity from additional diagnostic testing or treatment Examples only:  Drug  therapy requiring intensive monitoring for toxicity  Decision for elective major surgery with identified pateint or procedure risk factors  Decision regarding hospitalization or escalation of level of care  Decision for DNR or to de-escalate care   Parenteral controlled  substances            LEGEND - Independent interpretation involves the interpretation of a test for which there is a CPT code, and an interpretation or report is customary. When a review and interpretation of a test is performed and documented by the provider, but not separately reported (billed), then this would represent an independent interpretation. This report does not need to conform to the usual standards of a complete report of the test. This does not include interpretation of tests that do not have formal reports such as a complete blood count with differential and blood cultures. Examples would include reviewing a chest radiograph and documenting in the medical record an interpretation, but not separately reporting (billing) the interpretation of the chest radiograph.   An appropriate source includes professionals who are not health care professionals but may be involved in the management of the patient, such as a clinical research associate, upper officer, case manager or teacher, and does not include discussion with family or informal caregivers.    - SDOH: SDOH are the conditions in the environments where people are born, live, learn, work, play, worship, and age that affect a wide range of health, functioning, and  quality-of-life outcomes and risks. (e.g., housing, food insecurity, transportation, etc.). SDOH-related Z codes ranging from Z55-Z65 are the ICD-10-CM diagnosis codes used to document SDOH data Z55 - Problems related to education and literacy Z56 - Problems related to employment and unemployment Z57 - Occupational exposure to risk factors Z58 - Problems related to physical environment Z59 - Problems related to housing and economic circumstances [phone] - Problems related to social environment [phone] - Problems related to upbringing [phone] - Other problems related to primary support group, including family circumstances Z33 - Problems related to certain psychosocial circumstances Z65 - Problems related to other psychosocial circumstances

## 2024-11-12 NOTE — Telephone Encounter (Signed)
  Dear Patty [name] \  1) my apologies for missing of the lung function studies today 2) the lung function slightly down in terms of obstruction but this could be because you are not taking the Trelegy which we went ove. 3) could also be with the weight issue 4) since you are feeling fine with albuterol  we can continue the same but if you are interested we could give you a sample of Spiriva  and see how you feel by the time of next visit      Latest Ref Rng & Units 11/04/2024    1:40 PM 04/30/2024    3:18 PM 10/03/2023    2:23 PM 12/23/2022    1:51 PM 09/22/2022    2:27 PM 12/06/2021   12:57 PM 09/07/2021   12:54 PM  PFT Results  FVC-Pre L 2.18  P 2.29  2.40  2.28  2.16  2.32  2.28   FVC-Predicted Pre % 79  P 82  86  80  76  80  79   FVC-Post L    2.41      FVC-Predicted Post %    85      Pre FEV1/FVC % % 57  P 61  59  62  60  60  62   Post FEV1/FCV % %    62      FEV1-Pre L 1.24  P 1.41  1.42  1.41  1.30  1.38  1.41   FEV1-Predicted Pre % 60  P 67  68  65  61  63  65   FEV1-Post L    1.50      DLCO uncorrected ml/min/mmHg 10.11  P 10.99  12.75  12.06  10.92  11.82  11.94   DLCO UNC% % 54  P 59  68  64  57  62  63   DLCO corrected ml/min/mmHg   12.75  11.85  10.61  11.45  11.94   DLCO COR %Predicted %   68  62  56  60  63   DLVA Predicted % 60  P 63  73  67  66  66  70   TLC L    5.08      TLC % Predicted %    102      RV % Predicted %    117        P Preliminary result

## 2024-11-12 NOTE — Addendum Note (Signed)
 Addended by: BURT EVERTT RAMAN on: 11/12/2024 03:48 PM   Modules accepted: Orders

## 2024-11-12 NOTE — Patient Instructions (Addendum)
 Chronic hypoxemic respiratory failure  ILD (interstitial lung disease) (HCC)  WHO group 3 pulmonary arterial hypertension (HCC) with elevaed PCWP to 20 on Dec 2021 Former heavy smoker  Clinically stable and off inhaled treprostinil   Plan - continue albuterol  MDI 2 puff as needed for emergency - c monitor off inhaled treprostinil  --Continue oxygen  0-8 L nasal cannula pulsed with exertion and at night 2 L nasal cannula and room air at rest ---Make attempts to lose weight - talk to your PCP Jodie Lavern CROME, MD abut weight loss drugs - Get high-resolution CT chest sometime in the next few weeks   ````````````````````````````````````````````````````````````````````` OSA on CPAP  Stable  Plan  -Continue CPAP at ngith with 2L [place] at night  per the advice of his sleep doctor   Associated emphysema on CT -feb 2023 Normal Alpha 1 AT  stable  Plan  -prescribe albluterol 2 puff as needed - per shared decisio making just above is the treatment   Flu Vaccine  Plan  - High dose flu shot 11/12/2024    Follow-up  -6  months s face to face with Dr Geronimo; 15 min visit  = symptom score and walk test at followupo   -

## 2024-11-23 ENCOUNTER — Ambulatory Visit (HOSPITAL_BASED_OUTPATIENT_CLINIC_OR_DEPARTMENT_OTHER)
Admission: RE | Admit: 2024-11-23 | Discharge: 2024-11-23 | Disposition: A | Discharge status: Home / Self Care | Source: Ambulatory Visit | Attending: Internal Medicine | Admitting: Internal Medicine

## 2024-11-23 DIAGNOSIS — J849 Interstitial pulmonary disease, unspecified: Secondary | ICD-10-CM | POA: Insufficient documentation

## 2024-11-23 DIAGNOSIS — Z23 Encounter for immunization: Secondary | ICD-10-CM | POA: Diagnosis present

## 2024-11-23 DIAGNOSIS — Z87891 Personal history of nicotine dependence: Secondary | ICD-10-CM | POA: Diagnosis present

## 2024-11-23 DIAGNOSIS — R0609 Other forms of dyspnea: Secondary | ICD-10-CM | POA: Insufficient documentation

## 2024-11-23 DIAGNOSIS — J439 Emphysema, unspecified: Secondary | ICD-10-CM | POA: Insufficient documentation

## 2024-11-23 DIAGNOSIS — I2723 Pulmonary hypertension due to lung diseases and hypoxia: Secondary | ICD-10-CM | POA: Insufficient documentation

## 2024-11-23 DIAGNOSIS — J8410 Pulmonary fibrosis, unspecified: Secondary | ICD-10-CM | POA: Diagnosis present

## 2024-11-28 ENCOUNTER — Telehealth: Payer: Self-pay | Admitting: Pharmacist

## 2024-11-28 ENCOUNTER — Other Ambulatory Visit: Payer: Self-pay | Admitting: Pharmacist

## 2024-11-28 ENCOUNTER — Encounter: Payer: Self-pay | Admitting: Pharmacist

## 2024-11-28 NOTE — Telephone Encounter (Signed)
 Attempt was made to contact patient by phone today for follow up by Clinical Pharmacist regarding medication management / weight loss..  Unable to reach patient. LM on VM with my contact number [phone].

## 2024-12-11 ENCOUNTER — Ambulatory Visit: Admitting: Podiatry

## 2024-12-11 DIAGNOSIS — Q828 Other specified congenital malformations of skin: Secondary | ICD-10-CM | POA: Diagnosis not present

## 2024-12-11 DIAGNOSIS — M216X2 Other acquired deformities of left foot: Secondary | ICD-10-CM | POA: Diagnosis not present

## 2024-12-11 NOTE — Progress Notes (Signed)
 Subjective:  Patient ID: Patty [name], female    DOB: 09-26-1951,  MRN: [id number]  Chief Complaint  Patient presents with   Porokeratosis    73 y.o. female presents with the above complaint.  Patient presents with left submetatarsal 5 porokeratotic lesion with underlying plantarflexed metatarsal.  Patient states pain for touch is progressive gotten worse.  She will get it evaluated she has not seen and was prior to seeing me.  She is not a diabetic.  Pain scale 7 out of 10 hurts with ambulation.   Review of Systems: Negative except as noted in the HPI. Denies N/V/F/Ch.  Past Medical History:  Diagnosis Date   Abnormal nuclear stress test    a. 07/2017: cath with no CAD   Allergy 1970s   Onions & enviromental   Anxiety    Aortic atherosclerosis    Arthritis    Blood transfusion without reported diagnosis 1993   Bu-sectional surgery - 2 liters   Cataract 2020   right eye removed 2021   Chronic diastolic CHF (congestive heart failure) (HCC)    Coronary artery calcification seen on CT scan    Crohn disease (HCC)    Depression    moderate sometimes   DVT (deep venous thrombosis) (HCC)    Dyspnea    Grade I diastolic dysfunction    Hyperlipidemia    ILD (interstitial lung disease) (HCC)    OSA on CPAP    CPAP    AND O2   Oxygen  deficiency 2019   concentrator with exertion and w/CPAP   Pernicious anemia    Polycythemia vera (HCC)    Pulmonary embolism (HCC) 2010   s/p hip surgery , reports this was never truly confirmed    Pulmonary hypertension (HCC)    Sleep apnea 2018   use CPAP with 2 liters oxygen    Sleep disorder 01/10/2020   Uses klonopin  for sleep since 2014; sleep walking. And anxiety    Current Outpatient Medications:    albuterol  (VENTOLIN  HFA) 108 (90 Base) MCG/ACT inhaler, INHALE 2 PUFFS INTO THE LUNGS EVERY 6 HOURS AS NEEDED FOR WHEEZING OR SHORTNESS OF BREATH, Disp: 6.7 g, Rfl: 6   amoxicillin  (AMOXIL ) 500 MG capsule, Take 4 capsules (2,000 mg  total) by mouth once as needed for up to 1 dose. Before dental procedures (Patient not taking: Reported on 11/12/2024), Disp: 20 capsule, Rfl: 1   aspirin  EC 81 MG tablet, Take 81 mg by mouth daily., Disp: , Rfl:    Calcium Carbonate-Vit D-Min (CALCIUM 1200) 1200-1000 MG-UNIT CHEW, Chew 1 tablet by mouth daily., Disp: , Rfl:    CANNABIDIOL PO, Take 1 Dose by mouth 2 (two) times daily as needed (anxiety/sleep.). CBD Oil, Disp: , Rfl:    cetirizine -pseudoephedrine (ZYRTEC -D) 5-120 MG tablet, Take 1 tablet by mouth daily as needed (sinus headaches.)., Disp: , Rfl:    clonazePAM  (KLONOPIN ) 0.5 MG tablet, Take 1 tablet (0.5 mg total) by mouth daily as needed for anxiety., Disp: 90 tablet, Rfl: 1   cyanocobalamin  (VITAMIN B12) 1000 MCG/ML injection, Inject 1 mL (1,000 mcg total) into the muscle every 30 (thirty) days., Disp: 10 mL, Rfl: 1   dorzolamide (TRUSOPT) 2 % ophthalmic solution, Place 1 drop into the right eye 2 (two) times daily., Disp: , Rfl:    FLUoxetine  (PROZAC ) 20 MG capsule, Take 1 capsule (20 mg total) by mouth daily., Disp: 90 capsule, Rfl: 3   fluticasone (FLONASE) 50 MCG/ACT nasal spray, Place 1-2 sprays into both nostrils daily as  needed for allergies or rhinitis., Disp: , Rfl:    furosemide  (LASIX ) 20 MG tablet, TAKE 1 TABLET BY MOUTH DAILY AS NEEDED FOR FLUID OR SWELLING., Disp: 30 tablet, Rfl: 11   gabapentin  (NEURONTIN ) 100 MG capsule, Take 3 capsules (300 mg total) by mouth at bedtime., Disp: 90 capsule, Rfl: 11   hydrocortisone  (PROCTOZONE -HC) 2.5 % rectal cream, Place 1 application rectally 2 (two) times daily as needed (Crohn's flare)., Disp: 28 g, Rfl: 2   ibuprofen (ADVIL) 200 MG tablet, Take 400-800 mg by mouth every 8 (eight) hours as needed (pain.)., Disp: , Rfl:    mesalamine (PENTASA) 250 MG CR capsule, Take 250-500 mg by mouth 4 (four) times daily as needed (crohn's disease flare ups)., Disp: , Rfl:    Multiple Vitamins-Minerals (ICAPS AREDS 2 PO), Take 1 tablet by mouth  daily., Disp: , Rfl:    potassium chloride  (MICRO-K ) 10 MEQ CR capsule, Take 1 capsule (10 mEq total) by mouth daily as needed (when taking furosemide )., Disp: 30 capsule, Rfl: 3   prednisoLONE acetate (PRED FORTE) 1 % ophthalmic suspension, Place 1 drop into both eyes 2 (two) times daily as needed (Uveitis)., Disp: , Rfl:    Probiotic Product (PROBIOTIC PO), Take 1 capsule by mouth 2 (two) times a week. As needed, Disp: , Rfl:    SYRINGE-NEEDLE, DISP, 3 ML (LUER LOCK SAFETY SYRINGES) 25G X 1 3 ML MISC, USE AS DIRECTED, Disp: 100 each, Rfl: 5   traMADol  (ULTRAM ) 50 MG tablet, Take 1 tablet (50 mg total) by mouth every 6 (six) hours as needed., Disp: 20 tablet, Rfl: 0   triamcinolone  cream (KENALOG ) 0.1 %, Apply 1 Application topically 2 (two) times daily. For 2 weeks to legs, then as needed, Disp: 80 g, Rfl: 1  Social History   Tobacco Use  Smoking Status Former   Current packs/day: 0.00   Average packs/day: 1 pack/day for 45.0 years (45.0 ttl pk-yrs)   Types: Cigarettes   Start date: 12/27/1963   Quit date: 12/26/2008   Years since quitting: 15.9  Smokeless Tobacco Never    Allergies  Allergen Reactions   Tizanidine Hcl Shortness Of Breath   Victoza  [Liraglutide ] Shortness Of Breath   Metformin  And Related Diarrhea    Increased moodiness   Onion Other (See Comments)    sereve diarrhea, stomach pains (Crohn's flares)   Avastin  [Bevacizumab ]    Lasix  [Furosemide ] Itching    Hypersensitivity - pt can take as needed    Pirfenidone  Other (See Comments)    GI side effects   Potassium-Containing Compounds Other (See Comments)    Due to chron's, difficult passing though kidneys not allowing absorption in the body    Prednisone Other (See Comments)    Altered mood   Sulfa Drugs Cross Reactors Other (See Comments)    feels like bugs crawling on me   Trelegy Ellipta  [Fluticasone-Umeclidin-Vilant] Palpitations   Objective:  There were no vitals filed for this visit. There is no height  or weight on file to calculate BMI. Constitutional Well developed. Well nourished.  Vascular Dorsalis pedis pulses palpable bilaterally. Posterior tibial pulses palpable bilaterally. Capillary refill normal to all digits.  No cyanosis or clubbing noted. Pedal hair growth normal.  Neurologic Normal speech. Oriented to person, place, and time. Epicritic sensation to light touch grossly present bilaterally.  Dermatologic Hyperkeratotic lesion with central nucleated core noted.  Pain on palpation to the lesion.  Submetatarsal 5 lesion plantarflexed metatarsal noted  Orthopedic: Normal joint ROM without pain  or crepitus bilaterally. No visible deformities. No bony tenderness.   Radiographs: None Assessment:   No diagnosis found.   Plan:  Patient was evaluated and treated and all questions answered.  Left submetatarsal 5 porokeratosis -All questions and concerns were discussed with the patient in extensive detail.  I discussed shoe gear modification offloading padding protective in extensive detail she states understanding will obtain them.  Using chisel blade handle lesion was debrided down to healthy striated tissue.  Followed by excision of central nucleated core.  Immediate relief was noted.  No complication noted.  No follow-ups on file.

## 2024-12-18 ENCOUNTER — Ambulatory Visit: Payer: Self-pay | Admitting: Internal Medicine

## 2024-12-18 NOTE — Progress Notes (Signed)
 Chronic findings but do talk about it with [name] Parrett Jan 2026 visit esp cirrhosis Xxxx  IMPRESSION: 1. Very mild peripheral and basilar subpleural reticulation, coarsened ground-glass and traction bronchiolectasis, unchanged. Findings may occupying less than 5% of the lung parenchyma, supporting interstitial lung abnormality. Difficult to exclude interstitial lung disease, namely, usual interstitial pneumonitis. 2. Cirrhosis. 3. Aortic atherosclerosis (ICD10-I70.0). Coronary artery calcification. 4. Enlarged pulmonic trunk, indicative of pulmonary arterial hypertension.     Electronically Signed   By: Newell Eke M.D.   On: 11/25/2024 09:28

## 2025-01-02 ENCOUNTER — Other Ambulatory Visit: Admitting: Pharmacist

## 2025-01-03 ENCOUNTER — Other Ambulatory Visit: Payer: Self-pay | Admitting: Pharmacist

## 2025-01-03 DIAGNOSIS — R809 Proteinuria, unspecified: Secondary | ICD-10-CM

## 2025-01-03 DIAGNOSIS — E782 Mixed hyperlipidemia: Secondary | ICD-10-CM

## 2025-01-03 DIAGNOSIS — E1169 Type 2 diabetes mellitus with other specified complication: Secondary | ICD-10-CM

## 2025-01-03 NOTE — Progress Notes (Signed)
 "  01/03/2025 Name: Patty [name] MRN: [id number] DOB: [birth year]  Chief Complaint  Patient presents with   Medication Management    Patty [name] is a 74 y.o. year old female who presented for a telephone visit.   They were referred to the pharmacist by their PCP and self referred for assistance in managing weight management and medication questions.    Subjective:  Patient would like to discuss choices for diabetes and weight loss medications and also medications for proteinuria. We had discussed in 2025 but cost / coverage was an issue. We are rechecking coverage for 2026.    Patient reports history of surgery for Crohn's to remove portion of small and large intestine. Also states she was told she had fatty liver in past. Recent CT of chest showed the following:   11/23/2024 CT CHEST WITHOUT CONTRAST  COMPARISON:  10/10/2023, 03/30/2022.   FINDINGS: Cardiovascular: Atherosclerotic calcification of the aorta, aortic valve and coronary arteries. Enlarged pulmonic trunk. Heart is at the upper limits of normal in size to minimally enlarged. No pericardial effusion. Incidental note is made of lipomatous hypertrophy of the interatrial septum.   Mediastinum/Nodes: Thoracic inlet lymph nodes are not enlarged by CT size criteria. No pathologically enlarged mediastinal or axillary lymph nodes. Hilar regions are difficult to definitively evaluate without IV contrast. Esophagus is grossly unremarkable.   Lungs/Pleura: Very mild peripheral and basilar subpleural reticulation, coarsened ground-glass and traction bronchiolectasis, stable. Findings may occupy less than 5% of the lung parenchyma. No pleural fluid. Airway is unremarkable. No air trapping. Superimposed bibasilar scarring.   Upper Abdomen: Liver margin is irregular with hypertrophy of the left hepatic lobe and caudate. Liver is somewhat heterogeneous in attenuation. Visualized portions of the liver, adrenal  glands, spleen and stomach are otherwise grossly unremarkable.   Musculoskeletal: Degenerative changes in the spine.   IMPRESSION: 1. Very mild peripheral and basilar subpleural reticulation, coarsened ground-glass and traction bronchiolectasis, unchanged. Findings may occupying less than 5% of the lung parenchyma, supporting interstitial lung abnormality. Difficult to exclude interstitial lung disease, namely, usual interstitial pneumonitis. 2. Cirrhosis. 3. Aortic atherosclerosis (ICD10-I70.0). Coronary artery calcification. 4. Enlarged pulmonic trunk, indicative of pulmonary arterial hypertension.     Electronically Signed   By: Newell Eke M.D.   On: 11/25/2024 09:28  Care Team: Primary Care Provider: Jodie Lavern CROME, MD ; Next Scheduled Visit: not currently scheduled Cardiologist: Dr Delford; Next Scheduled Visit: not currently scheduled Ophthalmologist: Dr Fleeta at [facility] Pulmonology: Madelin Stank, NP; Next Scheduled Visit: 01/13/2025  Medication Access/Adherence  Current Pharmacy:  [id number] [place] [place], [place] - [address] AT [facility] [address] [place] [address] Phone: [phone] Fax: [phone]  [facility] - [place], [place] - [address] [facility] [address] [place] [address] Phone: [phone] Fax: [phone]   Patient reports affordability concerns with their medications: Yes  Patient reports access/transportation concerns to their pharmacy: No  Patient reports adherence concerns with their medications:  No      Diabetes: -  last A1c was 6.9%  Current medications: none  Medications tried in the past: she took metformin  for weight loss but stopped due to diarrhea; took Victoza  - stopped due to GI issues per patient but her allergy list states she had shortness of breath with Victoza .  Topiramate  - took in 2021 but patient did not recall why it was stopped.  Bupropion - took in 2015.  Patient thinks she was not able to tolerate bupropion  but she was not sure why.   Current physical activity: 1 or 2 days per week at same gym that she went to for pulmonary rehab  Current medication access support: none  Macrovascular and Microvascular Risk Reduction:  Statin? no; patient declined statin therapy; ACEi/ARB? Not currently but discussed in the past Last urinary albumin/creatinine ratio:  Lab Results  Component Value Date   MICRALBCREAT 34.7 (H) 10/07/2024   Last eye exam:  Lab Results  Component Value Date   HMDIABEYEEXA No Retinopathy 05/05/2023   Last foot exam: 03/04/2024 Tobacco Use:  Tobacco Use: Medium Risk (11/12/2024)   Patient History    Smoking Tobacco Use: Former    Smokeless Tobacco Use: Never    Passive Exposure: Not on file   Obesity/Overweight, Complicated by pulmonary arterial hypertension, Interstitial lung disease, Crohn's disease, type 2 DM, low back pain  Current medications: none  Weight Management treatments previously prescribed: metformin  - stopped due to diarrhea; Victoza  - stopped due to GI issues per patient but her allergy list states she had shortness of breath with Victoza .  In past we have discussed pros and cons of weight loss medications:  Phentermine  - not a good option due to history of glaucoma and possibility to increase HR, blood pressure and affect sleep.  Orlistat not a good option due to GI effect and patient has had surgery for Crohn's to remove portion of small and large intestine.  Wellbutrin / bupropion has taken for weight loss in past - patient thinks she has taken bupropion in past and had difficulty tolerating it.  topiramate  / Topamax  - She has taken in past but no notation that she has adverse effect.   Current meal patterns:  Patient reports she does not eat large meals.  Most hungry at night and when she snacks the most.   Wt Readings from Last 3 Encounters:  11/12/24 273 lb (123.8 kg)  10/07/24 269 lb  9.6 oz (122.3 kg)  09/24/24 271 lb 9.7 oz (123.2 kg)   BP Readings from Last 3 Encounters:  11/12/24 110/72  10/07/24 130/70  07/10/24 138/80     Objective:  Lab Results  Component Value Date   HGBA1C 6.9 (H) 10/07/2024    Lab Results  Component Value Date   CREATININE 0.73 10/08/2024   BUN 19 10/08/2024   NA 140 10/08/2024   K 4.0 10/08/2024   CL 102 10/08/2024   CO2 30 10/08/2024    Lab Results  Component Value Date   CHOL 188 10/07/2024   HDL 58.50 10/07/2024   LDLCALC 98 10/07/2024   TRIG 159.0 (H) 10/07/2024   CHOLHDL 3 10/07/2024    Medications Reviewed Today   Medications were not reviewed in this encounter       Assessment/Plan:   Diabetes: - Currently controlled; goal A1c <7%. Cardiorenal risk reduction is opportunities for improvement.. Blood pressure is at goal <130/80. LDL is < 100.  - Discussed benefits of SGLT2 and GLP1 agents on blood glucose, weight, renal and cardiovascular disease. Discussed potential side effects and costs.  - Reviewed her 2026 coverage for the following medications - patient would like to try Mounjaro if PCP is on board with trial  Mounjaro - first fill $626 will meet deductible and then cost will be $11 / month   Dapagliflozin and Farxiga - $183 / month until she meets deductible and then cost will be $46 / month  Jardiance - $204 / month until she meets deductible and then cost  will be $52 / month - Discussed side effects of gastrointestinal upset/nausea; eating smaller meals, avoiding high-fat foods, and remaining upright after eating may reduce nausea. Discussed that overeating is a major trigger of nausea with this class of medications, as often times patients will start to feel full sooner and may need to decrease portion sizes from what they were previously accustomed to. , Discussed potential side effects of dehydration, genitourinary infections., Encouraged adequate hydration and genital hygiene.   Obesity/Overweight:  Currently unable to achieve goal weight loss of 5-10% through diet and lifestyle modifications alone - Discussed pros and cons of several weight loss medications. Patient has a history of sensitivity to medication side effects.  - Recommended she continue to exercise - goal of 150 minutes per week or as tolerated.  - Will reach out to PCP for thoughts / recommendations about medication for diabetes / Proteinuria and weight.    Follow Up Plan: 2 to 3 weeks.   Madelin Ray, PharmD Clinical Pharmacist [facility]  Population Health [phone]    "

## 2025-01-13 ENCOUNTER — Ambulatory Visit: Admitting: Adult Health

## 2025-01-13 ENCOUNTER — Encounter: Payer: Self-pay | Admitting: Adult Health

## 2025-01-13 VITALS — BP 116/59 | HR 111 | Temp 98.1°F | Ht 64.0 in | Wt 267.8 lb

## 2025-01-13 DIAGNOSIS — I2723 Pulmonary hypertension due to lung diseases and hypoxia: Secondary | ICD-10-CM

## 2025-01-13 DIAGNOSIS — G4733 Obstructive sleep apnea (adult) (pediatric): Secondary | ICD-10-CM

## 2025-01-13 DIAGNOSIS — J439 Emphysema, unspecified: Secondary | ICD-10-CM | POA: Diagnosis not present

## 2025-01-13 DIAGNOSIS — Z87891 Personal history of nicotine dependence: Secondary | ICD-10-CM | POA: Diagnosis not present

## 2025-01-13 DIAGNOSIS — J9611 Chronic respiratory failure with hypoxia: Secondary | ICD-10-CM | POA: Diagnosis not present

## 2025-01-13 DIAGNOSIS — K760 Fatty (change of) liver, not elsewhere classified: Secondary | ICD-10-CM | POA: Diagnosis not present

## 2025-01-13 DIAGNOSIS — J310 Chronic rhinitis: Secondary | ICD-10-CM | POA: Diagnosis not present

## 2025-01-13 DIAGNOSIS — I272 Pulmonary hypertension, unspecified: Secondary | ICD-10-CM

## 2025-01-13 DIAGNOSIS — J849 Interstitial pulmonary disease, unspecified: Secondary | ICD-10-CM | POA: Diagnosis not present

## 2025-01-13 NOTE — Progress Notes (Signed)
 "  @Patient  ID: Patty [name], female    DOB: 03/16/1951, 74 y.o.   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   HSP neg, ACE nml , ANA neg , ANCA neg , CCP neg , Anti DNA DS neg , Sjogren neg , Scleroderma neg, RA factor neg ,  Autoimmune/CTD workup neg.  ESR was elevated c/w inflammation    FEV1 at 77%, ratio 73, FVC 81, no significant bronchodilator response, total lung capacity 100%, DLCO 52%. Overnight oximetry test did show significant desaturations.    HST 05/2017 >AHI 33/hr    Normal Alpha 1 AT   HRCT chest November 23, 2024 very mild peripheral and basilar subpleural reticulation, coarse and ground glass and traction bronchiolectasis, findings occupy less than 5% of the lung parenchyma  ILD timeline: Started Tyvaso  July 2022 stopped briefly and restarted September 2022-Stopped April 2025  Started Esbriet  Oct 2022 -unable to tolerate GI  Discussed the use of AI scribe software for clinical note transcription with the patient, who gave verbal consent to proceed.  History of Present Illness Patty [name] is a 74 year old female with interstitial lung disease and sleep apnea who presents for a two-month  follow-up.  Patient says overall breathing is doing about the same.  High-resolution CT chest November 2025 showed very mild peripheral and basilar subpleural reticulation, coarse and ground glass and traction bronchiolectasis findings occupy less than 5% of the lung parenchyma.  Patient was set up for pulmonary function testing completed on November 04, 2024 that showed slight decline in lung capacity with FEV1 at 60%, ratio 57, FVC 79%, DLCO 54%.  She discontinued Tyvaso  nine months ago due to adverse effects, including muscle spasms, leg cramps, and insomnia. No significant changes in her condition have been noted since stopping the medication.  She is on oxygen  therapy, using two liters at rest and eight liters during exertion. Although she does not require oxygen  at rest, she prefers to keep it on to avoid frequent adjustments. She uses a CPAP machine at night with three liters of oxygen .  She completed pulmonary rehabilitation in November. She experiences fatigue and lack of energy and has recently received injections for macular degeneration. She wants to continue with an oxygen  program but is concerned about the cost as Medicare does not cover it frequently.  She has a history of pulmonary hypertension, previously managed with Tyvaso . She also has Crohn's disease, stable since 1994, and is cautious about medications due to past adverse reactions. She occasionally uses albuterol  for tightness and phlegm and takes Zyrtec  D and Flonase as needed for sinus issues.  She has been diagnosed with a fatty liver and is considering further evaluation by a specialist. She has a history of pernicious anemia and consults a hematologist for fatigue management. She is not on medication  for Crohn's disease but occasionally takes Pentasa.  Remains on CPAP therapy for sleep apnea.  She uses her CPAP every single night.  CPAP download shows excellent compliance with 100% usage.  Daily average usage at 8 hours.  She  is on auto CPAP 5 to 15 cm H2O.  AHI is 0.8/hour.  She uses nasal mask.  Is on CPAP with oxygen  3 L at bedtime.   Allergies[1]  Immunization History  Administered Date(s) Administered   Fluad Quad(high Dose 65+) 10/07/2019, 09/09/2020, 11/27/2021, 09/19/2022   Fluad Trivalent(High Dose 65+) 09/25/2023, 09/19/2024   INFLUENZA, HIGH DOSE SEASONAL PF 08/28/2018, 11/12/2024   Influenza-Unspecified 11/23/2017   PFIZER Comirnaty(Gray Top)Covid-19 Tri-Sucrose Vaccine 09/19/2022   PFIZER(Purple Top)SARS-COV-2 Vaccination 02/16/2020, 03/17/2020, 12/14/2020, 05/14/2021, 10/27/2021   Pfizer(Comirnaty)Fall Seasonal Vaccine 12 years and older 09/21/2023   Pneumococcal Conjugate-13 02/13/2018   Pneumococcal Polysaccharide-23 03/12/2019   Respiratory Syncytial Virus Vaccine,Recomb Aduvanted(Arexvy) 09/24/2022    Past Medical History:  Diagnosis Date   Abnormal nuclear stress test    a. 07/2017: cath with no CAD   Allergy 1970s   Onions & enviromental   Anxiety    Aortic atherosclerosis    Arthritis    Blood transfusion without reported diagnosis 1993   Bu-sectional surgery - 2 liters   Cataract 2020   right eye removed 2021   Chronic diastolic CHF (congestive heart failure) (HCC)    Coronary artery calcification seen on CT scan    Crohn disease (HCC)    Depression    moderate sometimes   DVT (deep venous thrombosis) (HCC)    Dyspnea    Grade I diastolic dysfunction    Hyperlipidemia    ILD (interstitial lung disease) (HCC)    OSA on CPAP    CPAP    AND O2   Oxygen  deficiency 2019   concentrator with exertion and w/CPAP   Pernicious anemia    Polycythemia vera (HCC)    Pulmonary embolism (HCC) 2010   s/p hip surgery , reports this was never truly confirmed    Pulmonary hypertension (HCC)    Sleep apnea 2018   use CPAP with 2 liters oxygen    Sleep disorder 01/10/2020   Uses klonopin  for sleep since 2014; sleep walking. And anxiety    Tobacco History: Tobacco Use  History[2] Counseling given: Not Answered   Outpatient Medications Prior to Visit  Medication Sig Dispense Refill   albuterol  (VENTOLIN  HFA) 108 (90 Base) MCG/ACT inhaler INHALE 2 PUFFS INTO THE LUNGS EVERY 6 HOURS AS NEEDED FOR WHEEZING OR SHORTNESS OF BREATH 6.7 g 6   aspirin  EC 81 MG tablet Take 81 mg by mouth daily.     Calcium Carbonate-Vit D-Min (CALCIUM 1200) 1200-1000 MG-UNIT CHEW Chew 1 tablet by mouth daily.     CANNABIDIOL PO Take 1 Dose by mouth 2 (two) times daily as needed (anxiety/sleep.). CBD Oil     cetirizine -pseudoephedrine (ZYRTEC -D) 5-120 MG tablet Take 1 tablet by mouth daily as needed (sinus headaches.).     clonazePAM  (KLONOPIN ) 0.5 MG tablet Take 1 tablet (0.5 mg total) by mouth daily as needed for anxiety. 90 tablet 1   cyanocobalamin  (VITAMIN B12) 1000 MCG/ML injection Inject 1 mL (1,000 mcg total) into the muscle every 30 (thirty) days. 10 mL 1   dorzolamide (TRUSOPT) 2 % ophthalmic solution Place 1 drop into the right eye 2 (two) times daily.     FLUoxetine  (PROZAC ) 20 MG capsule Take 1 capsule (20 mg total) by mouth daily. 90 capsule 3  fluticasone (FLONASE) 50 MCG/ACT nasal spray Place 1-2 sprays into both nostrils daily as needed for allergies or rhinitis.     furosemide  (LASIX ) 20 MG tablet TAKE 1 TABLET BY MOUTH DAILY AS NEEDED FOR FLUID OR SWELLING. 30 tablet 11   gabapentin  (NEURONTIN ) 100 MG capsule Take 3 capsules (300 mg total) by mouth at bedtime. 90 capsule 11   hydrocortisone  (PROCTOZONE -HC) 2.5 % rectal cream Place 1 application rectally 2 (two) times daily as needed (Crohn's flare). 28 g 2   ibuprofen (ADVIL) 200 MG tablet Take 400-800 mg by mouth every 8 (eight) hours as needed (pain.).     latanoprost (XALATAN) 0.005 % ophthalmic solution Place 1 drop into both eyes at bedtime.     mesalamine (PENTASA) 250 MG CR capsule Take 250-500 mg by mouth 4 (four) times daily as needed (crohn's disease flare ups).     Multiple Vitamins-Minerals (ICAPS AREDS 2  PO) Take 1 tablet by mouth daily.     potassium chloride  (MICRO-K ) 10 MEQ CR capsule Take 1 capsule (10 mEq total) by mouth daily as needed (when taking furosemide ). 30 capsule 3   prednisoLONE acetate (PRED FORTE) 1 % ophthalmic suspension Place 1 drop into both eyes 2 (two) times daily as needed (Uveitis).     Probiotic Product (PROBIOTIC PO) Take 1 capsule by mouth 2 (two) times a week. As needed     SYRINGE-NEEDLE, DISP, 3 ML (LUER LOCK SAFETY SYRINGES) 25G X 1 3 ML MISC USE AS DIRECTED 100 each 5   traMADol  (ULTRAM ) 50 MG tablet Take 1 tablet (50 mg total) by mouth every 6 (six) hours as needed. 20 tablet 0   triamcinolone  cream (KENALOG ) 0.1 % Apply 1 Application topically 2 (two) times daily. For 2 weeks to legs, then as needed 80 g 1   amoxicillin  (AMOXIL ) 500 MG capsule Take 4 capsules (2,000 mg total) by mouth once as needed for up to 1 dose. Before dental procedures (Patient not taking: Reported on 11/12/2024) 20 capsule 1   No facility-administered medications prior to visit.     Review of Systems:   Constitutional:   No  weight loss, night sweats,  Fevers, chills, +fatigue, or  lassitude.  HEENT:   No headaches,  Difficulty swallowing,  Tooth/dental problems, or  Sore throat,                No sneezing, itching, ear ache, nasal congestion, post nasal drip,   CV:  No chest pain,  Orthopnea, PND, swelling in lower extremities, anasarca, dizziness, palpitations, syncope.   GI  No heartburn, indigestion, abdominal pain, nausea, vomiting, diarrhea, change in bowel habits, loss of appetite, bloody stools.   Resp:   No chest wall deformity  Skin: no rash or lesions.  GU: no dysuria, change in color of urine, no urgency or frequency.  No flank pain, no hematuria   MS:  No joint pain or swelling.  No decreased range of motion.  No back pain.    Physical Exam  BP (!) 116/59   Pulse (!) 111 Comment: on 6L while ambulating  Temp 98.1 F (36.7 C) (Oral)   Ht 5' 4 (1.626 m)    Wt 267 lb 12.8 oz (121.5 kg)   SpO2 96% Comment: on 6L while ambulating  BMI 45.97 kg/m   GEN: A/Ox3; pleasant , NAD, elderly, on O2    HEENT:  [place]/AT, NOSE-clear, THROAT-clear, no lesions, no postnasal drip or exudate noted.   NECK:  Supple w/ fair  ROM; no JVD; normal carotid impulses w/o bruits; no thyromegaly or nodules palpated; no lymphadenopathy.    RESP  Very faint BB crackles  no accessory muscle use, no dullness to percussion  CARD:  RRR, no m/r/g, no peripheral edema, pulses intact, no cyanosis or clubbing.  GI:   Soft & nt; nml bowel sounds; no organomegaly or masses detected.   Musco: Warm bil, no deformities or joint swelling noted.   Neuro: alert, no focal deficits noted.    Skin: Warm, no lesions or rashes  Vitals:   01/13/25 1351 01/13/25 1510 01/13/25 1511 01/13/25 1512  BP: (!) 116/59     Pulse: 90 85 Comment: at room air at rest (!) 105 Comment: on room air while ambulating (!) 111 Comment: on 6L while ambulating  Temp: 98.1 F (36.7 C)     Height: 5' 4 (1.626 m)     Weight: 267 lb 12.8 oz (121.5 kg)     SpO2: 97% Comment: on 3L 90% Comment: on room air at rest (!) 87% Comment: on room air while ambulating 96% Comment: on 6L while ambulating  TempSrc: Oral     BMI (Calculated): 45.95      ILD symptom score 01/13/2025 total score 7 with level 3 at housework and a level 4 walking upstairs  Lab Results:Reviewed 01/13/2025   CBC   BNP   Imaging: No results found.  Administration History     None          Latest Ref Rng & Units 11/04/2024    1:40 PM 04/30/2024    3:18 PM 10/03/2023    2:23 PM 12/23/2022    1:51 PM 09/22/2022    2:27 PM 12/06/2021   12:57 PM 09/07/2021   12:54 PM  PFT Results  FVC-Pre L 2.18  2.29  2.40  2.28  2.16  2.32  2.28   FVC-Predicted Pre % 79  82  86  80  76  80  79   FVC-Post L    2.41      FVC-Predicted Post %    85      Pre FEV1/FVC % % 57  61  59  62  60  60  62   Post FEV1/FCV % %    62      FEV1-Pre  L 1.24  1.41  1.42  1.41  1.30  1.38  1.41   FEV1-Predicted Pre % 60  67  68  65  61  63  65   FEV1-Post L    1.50      DLCO uncorrected ml/min/mmHg 10.11  10.99  12.75  12.06  10.92  11.82  11.94   DLCO UNC% % 54  59  68  64  57  62  63   DLCO corrected ml/min/mmHg   12.75  11.85  10.61  11.45  11.94   DLCO COR %Predicted %   68  62  56  60  63   DLVA Predicted % 60  63  73  67  66  66  70   TLC L    5.08      TLC % Predicted %    102      RV % Predicted %    117        No results found for: NITRICOXIDE     09/15/2022    3:00 PM  Results of the [facility] flowsheet  Sitting and reading 0  Watching TV 1  Sitting, inactive in a public place (e.g. a theatre or a meeting) 0  As a passenger in a car for an hour without a break 0  Lying down to rest in the afternoon when circumstances permit 0  Sitting and talking to someone 0  Sitting quietly after a lunch without alcohol 0  In a car, while stopped for a few minutes in traffic 0  Total score 1        Assessment & Plan:   Assessment and Plan Assessment & Plan Interstitial lung disease -NSIP -Very mild changes on CT chest.  Recent pulmonary function test does show a slight decline. Very mild fibrosis affects less than 5% of lung, parenchyma on CT scan.  Previously unable to tolerate antifibrotic therapy.  Also unable to tolerate Tyvaso  for pulmonary hypertension related ILD lung function is slightly decreased compared to six months ago  Continue current management and follow up in six months with a pulmonary function testing with Dr. Geronimo.  Pulmonary hypertension related ILD-clinically appears stable.  No increased oxygen  demands today on walk test Previously managed with Tyvaso , which was discontinued due to adverse effects such as muscle spasms, leg cramps, and insomnia. Currently managed with oxygen  therapy. Continue oxygen  therapy at 2 liters at rest and 6-8 liters with exertion.  Chronic respiratory failure with hypoxia   -compensated on oxygen  Managed with oxygen  therapy. No daily inhalers are used, but albuterol  is used as needed for tightness and phlegm. Continue oxygen  therapy at 2 liters at rest and 6-8 liters with exertion. Use albuterol  as needed for respiratory symptoms.  Obstructive sleep apnea excellent control and compliance on CPAP therapy Well-controlled with CPAP therapy, with consistent usage of 7.5 to 8 hours per night and zero episodes of sleep apnea. Await results from the recent home sleep study. Continue CPAP therapy at current settings and ensure CPAP equipment is clean and maintained.  Fatty liver disease  -noted on recent CT scan. Noted on previous tests with no current specialist follow-up. Referred to GI for evaluation and management.  COPD/Emphysema-appears currently stable.  Not on maintenance inhaler.  Pulmonary function testing shows moderate obstruction with slight decline over the last 6 months.  Patient says that previous inhalers have not had significant benefit.  May use albuterol  as needed.  Chronic rhinitis -controlled     Patty Roza, NP 01/13/2025  I spent 45   minutes dedicated to the care of this patient on the date of this encounter to include pre-visit review of records, face-to-face time with the patient discussing conditions above, post visit ordering of testing, clinical documentation with the electronic health record, making appropriate referrals as documented, and communicating necessary findings to members of the patients care team.      [1]  Allergies Allergen Reactions   Tizanidine Hcl Shortness Of Breath   Victoza  [Liraglutide ] Shortness Of Breath   Metformin  And Related Diarrhea    Increased moodiness   Onion Other (See Comments)    sereve diarrhea, stomach pains (Crohn's flares)   Avastin  [Bevacizumab ]    Lasix  [Furosemide ] Itching    Hypersensitivity - pt can take as needed    Pirfenidone  Other (See Comments)    GI side effects    Potassium-Containing Compounds Other (See Comments)    Due to chron's, difficult passing though kidneys not allowing absorption in the body    Prednisone Other (See Comments)    Altered mood   Sulfa Drugs Cross Reactors Other (See Comments)    feels like bugs  crawling on me   Trelegy Ellipta  [Fluticasone-Umeclidin-Vilant] Palpitations  [2]  Social History Tobacco Use  Smoking Status Former   Current packs/day: 0.00   Average packs/day: 1 pack/day for 45.0 years (45.0 ttl pk-yrs)   Types: Cigarettes   Start date: 12/27/1963   Quit date: 12/26/2008   Years since quitting: 16.0  Smokeless Tobacco Never   "

## 2025-01-13 NOTE — Patient Instructions (Addendum)
 Continue on Oxygen  2l/m rest and 8 l/m with activity/exercise  Continue CPAP At bedtime  with Oxygen  3l/m  Home sleep study results pending Albuterol  inhaler As needed   Zyrtec  10mg  daily As needed   Flonase nasal As needed   Saline nasal spray /gel As needed   Follow up with Dr. DARLYN for Fatty Liver  Follow up with Dr. Geronimo in 6 months with Spirometry with DLCO

## 2025-01-22 ENCOUNTER — Ambulatory Visit: Payer: Self-pay | Admitting: Internal Medicine

## 2025-01-22 NOTE — Progress Notes (Signed)
 Overnight pulse oximetry done on 2 L oxygen  at CPAP shows no desaturation.  There is no need to adjust the oxygen  level at night beyond your current 2 L

## 2025-01-23 NOTE — Telephone Encounter (Signed)
 [name], Please see patient message and advise.  Thank you.

## 2025-01-27 ENCOUNTER — Ambulatory Visit: Admitting: Adult Health

## 2025-01-27 ENCOUNTER — Telehealth: Payer: Self-pay | Admitting: Pharmacist

## 2025-01-27 MED ORDER — EMPAGLIFLOZIN 25 MG PO TABS: 25.0000 mg | ORAL_TABLET | Freq: Every day | ORAL | 2 refills | Status: AC

## 2025-01-29 NOTE — Telephone Encounter (Signed)
 FYI

## 2025-01-30 NOTE — Telephone Encounter (Signed)
 Please advise.

## 2025-02-28 ENCOUNTER — Other Ambulatory Visit

## 2025-04-23 ENCOUNTER — Ambulatory Visit: Admitting: Family Medicine
